# Patient Record
Sex: Female | Born: 1945 | Race: White | Hispanic: No | Smoking: Former smoker
Health system: Southern US, Community
[De-identification: ages and names within clinical notes are randomized; demographics above are authoritative.]

## PROBLEM LIST (undated history)

## (undated) DIAGNOSIS — D369 Benign neoplasm, unspecified site: Secondary | ICD-10-CM

## (undated) DIAGNOSIS — K449 Diaphragmatic hernia without obstruction or gangrene: Secondary | ICD-10-CM

## (undated) DIAGNOSIS — R634 Abnormal weight loss: Secondary | ICD-10-CM

## (undated) DIAGNOSIS — C801 Malignant (primary) neoplasm, unspecified: Secondary | ICD-10-CM

## (undated) DIAGNOSIS — I10 Essential (primary) hypertension: Secondary | ICD-10-CM

## (undated) DIAGNOSIS — K219 Gastro-esophageal reflux disease without esophagitis: Secondary | ICD-10-CM

## (undated) DIAGNOSIS — C50919 Malignant neoplasm of unspecified site of unspecified female breast: Secondary | ICD-10-CM

## (undated) DIAGNOSIS — R630 Anorexia: Secondary | ICD-10-CM

## (undated) DIAGNOSIS — M25471 Effusion, right ankle: Principal | ICD-10-CM

## (undated) DIAGNOSIS — C7951 Secondary malignant neoplasm of bone: Principal | ICD-10-CM

## (undated) DIAGNOSIS — J189 Pneumonia, unspecified organism: Secondary | ICD-10-CM

## (undated) DIAGNOSIS — S8251XA Displaced fracture of medial malleolus of right tibia, initial encounter for closed fracture: Secondary | ICD-10-CM

## (undated) DIAGNOSIS — C799 Secondary malignant neoplasm of unspecified site: Secondary | ICD-10-CM

## (undated) DIAGNOSIS — A0472 Enterocolitis due to Clostridium difficile, not specified as recurrent: Secondary | ICD-10-CM

## (undated) DIAGNOSIS — T50905A Adverse effect of unspecified drugs, medicaments and biological substances, initial encounter: Secondary | ICD-10-CM

## (undated) DIAGNOSIS — IMO0002 Reserved for concepts with insufficient information to code with codable children: Secondary | ICD-10-CM

## (undated) DIAGNOSIS — K222 Esophageal obstruction: Secondary | ICD-10-CM

## (undated) HISTORY — DX: Secondary malignant neoplasm of bone: C79.51

## (undated) HISTORY — DX: Displaced fracture of medial malleolus of right tibia, initial encounter for closed fracture: S82.51XA

## (undated) HISTORY — DX: Diaphragmatic hernia without obstruction or gangrene: K44.9

## (undated) HISTORY — PX: OOPHORECTOMY: SHX86

## (undated) HISTORY — DX: Malignant (primary) neoplasm, unspecified: C80.1

## (undated) HISTORY — PX: CHOLECYSTECTOMY: SHX55

## (undated) HISTORY — PX: LYMPH NODE DISSECTION: SHX5087

## (undated) HISTORY — DX: Esophageal obstruction: K22.2

## (undated) HISTORY — DX: Malignant neoplasm of unspecified site of unspecified female breast: C50.919

## (undated) HISTORY — DX: Essential (primary) hypertension: I10

## (undated) HISTORY — DX: Abnormal weight loss: R63.4

## (undated) HISTORY — DX: Gastro-esophageal reflux disease without esophagitis: K21.9

## (undated) HISTORY — DX: Benign neoplasm, unspecified site: D36.9

## (undated) HISTORY — PX: PARATHYROIDECTOMY: SHX19

## (undated) HISTORY — DX: Anorexia: R63.0

## (undated) HISTORY — PX: CARPAL TUNNEL RELEASE: SHX101

## (undated) HISTORY — DX: Effusion, right ankle: M25.471

## (undated) HISTORY — DX: Adverse effect of unspecified drugs, medicaments and biological substances, initial encounter: T50.905A

## (undated) HISTORY — PX: BREAST SURGERY: SHX581

## (undated) HISTORY — PX: INGUINAL HERNIA REPAIR: SUR1180

---

## 1989-04-29 HISTORY — PX: GALLBLADDER SURGERY: SHX652

## 2001-09-15 ENCOUNTER — Other Ambulatory Visit: Admission: RE | Admit: 2001-09-15 | Discharge: 2001-09-15 | Payer: Self-pay | Admitting: Unknown Physician Specialty

## 2002-05-10 ENCOUNTER — Encounter: Payer: Self-pay | Admitting: Surgery

## 2002-05-10 ENCOUNTER — Ambulatory Visit (HOSPITAL_COMMUNITY): Admission: RE | Admit: 2002-05-10 | Discharge: 2002-05-10 | Payer: Self-pay | Admitting: Surgery

## 2002-07-19 ENCOUNTER — Encounter: Payer: Self-pay | Admitting: Surgery

## 2002-07-22 ENCOUNTER — Encounter (INDEPENDENT_AMBULATORY_CARE_PROVIDER_SITE_OTHER): Payer: Self-pay | Admitting: Specialist

## 2002-07-22 ENCOUNTER — Ambulatory Visit (HOSPITAL_COMMUNITY): Admission: RE | Admit: 2002-07-22 | Discharge: 2002-07-23 | Payer: Self-pay | Admitting: Surgery

## 2003-04-07 ENCOUNTER — Ambulatory Visit (HOSPITAL_BASED_OUTPATIENT_CLINIC_OR_DEPARTMENT_OTHER): Admission: RE | Admit: 2003-04-07 | Discharge: 2003-04-07 | Payer: Self-pay | Admitting: Orthopedic Surgery

## 2003-04-26 ENCOUNTER — Ambulatory Visit (HOSPITAL_BASED_OUTPATIENT_CLINIC_OR_DEPARTMENT_OTHER): Admission: RE | Admit: 2003-04-26 | Discharge: 2003-04-26 | Payer: Self-pay | Admitting: Orthopedic Surgery

## 2004-07-09 ENCOUNTER — Ambulatory Visit: Payer: Self-pay | Admitting: Internal Medicine

## 2005-03-06 ENCOUNTER — Ambulatory Visit: Payer: Self-pay | Admitting: Internal Medicine

## 2005-11-18 ENCOUNTER — Ambulatory Visit: Admission: RE | Admit: 2005-11-18 | Discharge: 2006-01-19 | Payer: Self-pay | Admitting: *Deleted

## 2006-02-11 ENCOUNTER — Ambulatory Visit: Payer: Self-pay | Admitting: Oncology

## 2006-02-21 ENCOUNTER — Ambulatory Visit: Payer: Self-pay | Admitting: Internal Medicine

## 2006-03-24 ENCOUNTER — Ambulatory Visit (HOSPITAL_COMMUNITY): Admission: RE | Admit: 2006-03-24 | Discharge: 2006-03-24 | Payer: Self-pay | Admitting: Internal Medicine

## 2006-03-24 ENCOUNTER — Ambulatory Visit: Payer: Self-pay | Admitting: Internal Medicine

## 2006-05-13 ENCOUNTER — Ambulatory Visit: Payer: Self-pay | Admitting: Oncology

## 2006-06-02 ENCOUNTER — Encounter: Admission: RE | Admit: 2006-06-02 | Discharge: 2006-06-02 | Payer: Self-pay | Admitting: Oncology

## 2006-09-11 ENCOUNTER — Ambulatory Visit: Payer: Self-pay | Admitting: Oncology

## 2007-02-11 ENCOUNTER — Ambulatory Visit: Payer: Self-pay | Admitting: Oncology

## 2007-04-01 ENCOUNTER — Ambulatory Visit: Payer: Self-pay | Admitting: Internal Medicine

## 2007-06-09 ENCOUNTER — Encounter: Admission: RE | Admit: 2007-06-09 | Discharge: 2007-06-09 | Payer: Self-pay | Admitting: Oncology

## 2007-09-07 ENCOUNTER — Ambulatory Visit: Payer: Self-pay | Admitting: Oncology

## 2007-12-02 ENCOUNTER — Ambulatory Visit: Payer: Self-pay | Admitting: Oncology

## 2007-12-04 LAB — COMPREHENSIVE METABOLIC PANEL
AST: 20 U/L (ref 0–37)
Albumin: 4 g/dL (ref 3.5–5.2)
BUN: 17 mg/dL (ref 6–23)
CO2: 25 mEq/L (ref 19–32)
Calcium: 8.6 mg/dL (ref 8.4–10.5)
Chloride: 106 mEq/L (ref 96–112)
Glucose, Bld: 91 mg/dL (ref 70–99)
Potassium: 3.8 mEq/L (ref 3.5–5.3)

## 2007-12-04 LAB — CBC WITH DIFFERENTIAL/PLATELET
Basophils Absolute: 0 10*3/uL (ref 0.0–0.1)
EOS%: 2.7 % (ref 0.0–7.0)
Eosinophils Absolute: 0.2 10*3/uL (ref 0.0–0.5)
HGB: 14.2 g/dL (ref 11.6–15.9)
MONO#: 0.5 10*3/uL (ref 0.1–0.9)
NEUT#: 4.2 10*3/uL (ref 1.5–6.5)
RDW: 13.4 % (ref 11.3–14.5)
WBC: 5.9 10*3/uL (ref 3.9–10.0)
lymph#: 1 10*3/uL (ref 0.9–3.3)

## 2008-04-07 ENCOUNTER — Ambulatory Visit: Payer: Self-pay | Admitting: Internal Medicine

## 2008-05-11 ENCOUNTER — Ambulatory Visit: Payer: Self-pay | Admitting: Oncology

## 2008-09-21 ENCOUNTER — Ambulatory Visit: Payer: Self-pay | Admitting: Oncology

## 2008-09-23 LAB — CBC WITH DIFFERENTIAL/PLATELET
BASO%: 0.4 % (ref 0.0–2.0)
LYMPH%: 15.3 % (ref 14.0–49.7)
MCHC: 34.1 g/dL (ref 31.5–36.0)
MONO#: 0.8 10*3/uL (ref 0.1–0.9)
Platelets: 225 10*3/uL (ref 145–400)
RBC: 4.32 10*6/uL (ref 3.70–5.45)
WBC: 8.4 10*3/uL (ref 3.9–10.3)

## 2008-09-23 LAB — COMPREHENSIVE METABOLIC PANEL
ALT: 27 U/L (ref 0–35)
AST: 33 U/L (ref 0–37)
Alkaline Phosphatase: 50 U/L (ref 39–117)
CO2: 27 mEq/L (ref 19–32)
Sodium: 141 mEq/L (ref 135–145)
Total Bilirubin: 0.6 mg/dL (ref 0.3–1.2)
Total Protein: 5.9 g/dL — ABNORMAL LOW (ref 6.0–8.3)

## 2008-09-23 LAB — LACTATE DEHYDROGENASE: LDH: 159 U/L (ref 94–250)

## 2008-09-27 LAB — ANA: Anti Nuclear Antibody(ANA): NEGATIVE

## 2008-09-27 LAB — SEDIMENTATION RATE: Sed Rate: 2 mm/hr (ref 0–22)

## 2008-10-06 ENCOUNTER — Ambulatory Visit (HOSPITAL_COMMUNITY): Admission: RE | Admit: 2008-10-06 | Discharge: 2008-10-06 | Payer: Self-pay | Admitting: Ophthalmology

## 2008-12-26 DIAGNOSIS — Z853 Personal history of malignant neoplasm of breast: Secondary | ICD-10-CM | POA: Insufficient documentation

## 2008-12-26 DIAGNOSIS — Z8672 Personal history of thrombophlebitis: Secondary | ICD-10-CM | POA: Insufficient documentation

## 2008-12-26 DIAGNOSIS — R110 Nausea: Secondary | ICD-10-CM | POA: Insufficient documentation

## 2008-12-26 DIAGNOSIS — R197 Diarrhea, unspecified: Secondary | ICD-10-CM | POA: Insufficient documentation

## 2008-12-26 DIAGNOSIS — M1990 Unspecified osteoarthritis, unspecified site: Secondary | ICD-10-CM | POA: Insufficient documentation

## 2008-12-26 DIAGNOSIS — K219 Gastro-esophageal reflux disease without esophagitis: Secondary | ICD-10-CM | POA: Insufficient documentation

## 2008-12-26 DIAGNOSIS — Z860100 Personal history of colon polyps, unspecified: Secondary | ICD-10-CM | POA: Insufficient documentation

## 2008-12-27 ENCOUNTER — Ambulatory Visit: Payer: Self-pay | Admitting: Internal Medicine

## 2009-01-04 ENCOUNTER — Encounter: Payer: Self-pay | Admitting: Internal Medicine

## 2009-01-06 ENCOUNTER — Ambulatory Visit (HOSPITAL_COMMUNITY): Admission: RE | Admit: 2009-01-06 | Discharge: 2009-01-06 | Payer: Self-pay | Admitting: Internal Medicine

## 2009-01-10 ENCOUNTER — Encounter (HOSPITAL_COMMUNITY): Admission: RE | Admit: 2009-01-10 | Discharge: 2009-01-25 | Payer: Self-pay | Admitting: Internal Medicine

## 2009-01-10 ENCOUNTER — Encounter: Payer: Self-pay | Admitting: Internal Medicine

## 2009-01-10 DIAGNOSIS — R9389 Abnormal findings on diagnostic imaging of other specified body structures: Secondary | ICD-10-CM

## 2009-01-11 ENCOUNTER — Encounter: Payer: Self-pay | Admitting: Internal Medicine

## 2009-01-13 ENCOUNTER — Ambulatory Visit (HOSPITAL_COMMUNITY): Admission: RE | Admit: 2009-01-13 | Discharge: 2009-01-13 | Payer: Self-pay | Admitting: Internal Medicine

## 2009-01-13 ENCOUNTER — Ambulatory Visit: Payer: Self-pay | Admitting: Internal Medicine

## 2009-01-13 LAB — CONVERTED CEMR LAB
Beta Globulin: 7.4 % — ABNORMAL HIGH (ref 4.7–7.2)
Gamma Globulin: 11.9 % (ref 11.1–18.8)
Total Protein, Serum Electrophoresis: 6.8 g/dL (ref 6.0–8.3)

## 2009-01-16 ENCOUNTER — Encounter: Payer: Self-pay | Admitting: Internal Medicine

## 2009-01-17 ENCOUNTER — Ambulatory Visit (HOSPITAL_COMMUNITY): Admission: RE | Admit: 2009-01-17 | Discharge: 2009-01-17 | Payer: Self-pay | Admitting: Oncology

## 2009-01-18 ENCOUNTER — Ambulatory Visit: Payer: Self-pay | Admitting: Oncology

## 2009-01-18 LAB — COMPREHENSIVE METABOLIC PANEL
ALT: 19 U/L (ref 0–35)
AST: 23 U/L (ref 0–37)
BUN: 13 mg/dL (ref 6–23)
Calcium: 8.5 mg/dL (ref 8.4–10.5)
Chloride: 108 mEq/L (ref 96–112)
Creatinine, Ser: 0.77 mg/dL (ref 0.40–1.20)
Total Bilirubin: 0.5 mg/dL (ref 0.3–1.2)

## 2009-01-18 LAB — CBC WITH DIFFERENTIAL/PLATELET
BASO%: 0.3 % (ref 0.0–2.0)
Basophils Absolute: 0 10*3/uL (ref 0.0–0.1)
EOS%: 4 % (ref 0.0–7.0)
HCT: 38.5 % (ref 34.8–46.6)
HGB: 12.9 g/dL (ref 11.6–15.9)
LYMPH%: 15.7 % (ref 14.0–49.7)
MCH: 29.7 pg (ref 25.1–34.0)
MCHC: 33.5 g/dL (ref 31.5–36.0)
MCV: 88.5 fL (ref 79.5–101.0)
NEUT%: 69.8 % (ref 38.4–76.8)
Platelets: 230 10*3/uL (ref 145–400)
lymph#: 1 10*3/uL (ref 0.9–3.3)

## 2009-01-18 LAB — CANCER ANTIGEN 27.29: CA 27.29: 156 U/mL — ABNORMAL HIGH (ref 0–39)

## 2009-01-30 ENCOUNTER — Encounter: Payer: Self-pay | Admitting: Internal Medicine

## 2009-01-31 ENCOUNTER — Encounter: Payer: Self-pay | Admitting: Oncology

## 2009-01-31 ENCOUNTER — Other Ambulatory Visit: Admission: RE | Admit: 2009-01-31 | Discharge: 2009-01-31 | Payer: Self-pay | Admitting: Oncology

## 2009-01-31 LAB — CBC WITH DIFFERENTIAL/PLATELET
Basophils Absolute: 0.1 10*3/uL (ref 0.0–0.1)
Eosinophils Absolute: 0.2 10*3/uL (ref 0.0–0.5)
HGB: 15.7 g/dL (ref 11.6–15.9)
MCV: 91.7 fL (ref 79.5–101.0)
MONO#: 1.5 10*3/uL — ABNORMAL HIGH (ref 0.1–0.9)
NEUT#: 13.6 10*3/uL — ABNORMAL HIGH (ref 1.5–6.5)
RBC: 5.14 10*6/uL (ref 3.70–5.45)
RDW: 13.6 % (ref 11.2–14.5)
WBC: 17.2 10*3/uL — ABNORMAL HIGH (ref 3.9–10.3)
lymph#: 1.8 10*3/uL (ref 0.9–3.3)

## 2009-01-31 LAB — MORPHOLOGY
PLT EST: ADEQUATE
RBC Comments: NORMAL

## 2009-02-02 LAB — BETA 2 MICROGLOBULIN, SERUM: Beta-2 Microglobulin: 1.59 mg/L (ref 1.01–1.73)

## 2009-02-02 LAB — IMMUNOFIXATION ELECTROPHORESIS
IgA: 259 mg/dL (ref 68–378)
IgG (Immunoglobin G), Serum: 868 mg/dL (ref 694–1618)
IgM, Serum: 122 mg/dL (ref 60–263)
Total Protein, Serum Electrophoresis: 6.8 g/dL (ref 6.0–8.3)

## 2009-02-14 ENCOUNTER — Ambulatory Visit (HOSPITAL_COMMUNITY): Admission: RE | Admit: 2009-02-14 | Discharge: 2009-02-14 | Payer: Self-pay | Admitting: Oncology

## 2009-02-16 ENCOUNTER — Ambulatory Visit (HOSPITAL_COMMUNITY): Admission: RE | Admit: 2009-02-16 | Discharge: 2009-02-16 | Payer: Self-pay | Admitting: Oncology

## 2009-02-22 ENCOUNTER — Encounter: Payer: Self-pay | Admitting: Internal Medicine

## 2009-02-27 ENCOUNTER — Encounter: Payer: Self-pay | Admitting: Internal Medicine

## 2009-03-02 ENCOUNTER — Ambulatory Visit: Payer: Self-pay | Admitting: Oncology

## 2009-03-06 ENCOUNTER — Encounter: Payer: Self-pay | Admitting: Internal Medicine

## 2009-03-06 LAB — LACTATE DEHYDROGENASE: LDH: 233 U/L (ref 94–250)

## 2009-03-06 LAB — CBC WITH DIFFERENTIAL/PLATELET
BASO%: 0.3 % (ref 0.0–2.0)
Basophils Absolute: 0 10*3/uL (ref 0.0–0.1)
EOS%: 3.9 % (ref 0.0–7.0)
MCH: 30.5 pg (ref 25.1–34.0)
MCHC: 34 g/dL (ref 31.5–36.0)
MCV: 89.8 fL (ref 79.5–101.0)
MONO%: 12 % (ref 0.0–14.0)
RBC: 4.69 10*6/uL (ref 3.70–5.45)
RDW: 13.8 % (ref 11.2–14.5)
lymph#: 1.1 10*3/uL (ref 0.9–3.3)

## 2009-03-06 LAB — COMPREHENSIVE METABOLIC PANEL
AST: 22 U/L (ref 0–37)
Albumin: 4 g/dL (ref 3.5–5.2)
BUN: 13 mg/dL (ref 6–23)
Calcium: 9.1 mg/dL (ref 8.4–10.5)
Chloride: 108 mEq/L (ref 96–112)
Creatinine, Ser: 0.6 mg/dL (ref 0.40–1.20)
Glucose, Bld: 89 mg/dL (ref 70–99)

## 2009-03-06 LAB — PROTIME-INR
INR: 2.7 (ref 2.00–3.50)
Protime: 32.4 Seconds — ABNORMAL HIGH (ref 10.6–13.4)

## 2009-03-08 ENCOUNTER — Encounter: Payer: Self-pay | Admitting: Internal Medicine

## 2009-03-17 ENCOUNTER — Encounter (INDEPENDENT_AMBULATORY_CARE_PROVIDER_SITE_OTHER): Payer: Self-pay | Admitting: *Deleted

## 2009-03-17 ENCOUNTER — Ambulatory Visit (HOSPITAL_COMMUNITY): Admission: RE | Admit: 2009-03-17 | Discharge: 2009-03-17 | Payer: Self-pay | Admitting: Oncology

## 2009-04-05 ENCOUNTER — Encounter: Payer: Self-pay | Admitting: Internal Medicine

## 2009-04-26 ENCOUNTER — Ambulatory Visit: Payer: Self-pay | Admitting: Oncology

## 2009-04-29 DIAGNOSIS — K449 Diaphragmatic hernia without obstruction or gangrene: Secondary | ICD-10-CM

## 2009-04-29 DIAGNOSIS — D369 Benign neoplasm, unspecified site: Secondary | ICD-10-CM

## 2009-04-29 HISTORY — DX: Diaphragmatic hernia without obstruction or gangrene: K44.9

## 2009-04-29 HISTORY — DX: Benign neoplasm, unspecified site: D36.9

## 2009-05-01 LAB — COMPREHENSIVE METABOLIC PANEL
ALT: 13 U/L (ref 0–35)
AST: 19 U/L (ref 0–37)
Albumin: 4 g/dL (ref 3.5–5.2)
Alkaline Phosphatase: 61 U/L (ref 39–117)
BUN: 14 mg/dL (ref 6–23)
Potassium: 3.5 mEq/L (ref 3.5–5.3)
Sodium: 141 mEq/L (ref 135–145)
Total Protein: 6.1 g/dL (ref 6.0–8.3)

## 2009-05-01 LAB — CBC WITH DIFFERENTIAL/PLATELET
Eosinophils Absolute: 0.1 10*3/uL (ref 0.0–0.5)
HGB: 13.8 g/dL (ref 11.6–15.9)
MONO#: 0.8 10*3/uL (ref 0.1–0.9)
NEUT#: 4.7 10*3/uL (ref 1.5–6.5)
RBC: 4.51 10*6/uL (ref 3.70–5.45)
RDW: 15.1 % — ABNORMAL HIGH (ref 11.2–14.5)
WBC: 6.8 10*3/uL (ref 3.9–10.3)
lymph#: 1.1 10*3/uL (ref 0.9–3.3)

## 2009-05-01 LAB — PROTIME-INR
INR: 3.5 (ref 2.00–3.50)
Protime: 42 Seconds — ABNORMAL HIGH (ref 10.6–13.4)

## 2009-05-01 LAB — LACTATE DEHYDROGENASE: LDH: 197 U/L (ref 94–250)

## 2009-05-01 LAB — CANCER ANTIGEN 27.29: CA 27.29: 82 U/mL — ABNORMAL HIGH (ref 0–39)

## 2009-05-08 ENCOUNTER — Ambulatory Visit (HOSPITAL_COMMUNITY): Admission: RE | Admit: 2009-05-08 | Discharge: 2009-05-08 | Payer: Self-pay | Admitting: Oncology

## 2009-05-26 ENCOUNTER — Encounter: Payer: Self-pay | Admitting: Internal Medicine

## 2009-07-27 ENCOUNTER — Ambulatory Visit: Payer: Self-pay | Admitting: Oncology

## 2009-07-31 LAB — COMPREHENSIVE METABOLIC PANEL
Albumin: 4.2 g/dL (ref 3.5–5.2)
Alkaline Phosphatase: 61 U/L (ref 39–117)
BUN: 18 mg/dL (ref 6–23)
Creatinine, Ser: 0.66 mg/dL (ref 0.40–1.20)
Glucose, Bld: 114 mg/dL — ABNORMAL HIGH (ref 70–99)
Potassium: 3.5 mEq/L (ref 3.5–5.3)
Total Bilirubin: 0.4 mg/dL (ref 0.3–1.2)

## 2009-07-31 LAB — PROTIME-INR: Protime: 26.4 Seconds — ABNORMAL HIGH (ref 10.6–13.4)

## 2009-07-31 LAB — CBC WITH DIFFERENTIAL/PLATELET
BASO%: 0.4 % (ref 0.0–2.0)
EOS%: 1.4 % (ref 0.0–7.0)
LYMPH%: 14.5 % (ref 14.0–49.7)
MCH: 31.4 pg (ref 25.1–34.0)
MCHC: 34 g/dL (ref 31.5–36.0)
MCV: 92.3 fL (ref 79.5–101.0)
MONO%: 8.1 % (ref 0.0–14.0)
Platelets: 263 10*3/uL (ref 145–400)
RBC: 4.57 10*6/uL (ref 3.70–5.45)

## 2009-07-31 LAB — CANCER ANTIGEN 27.29: CA 27.29: 39 U/mL (ref 0–39)

## 2009-08-02 ENCOUNTER — Encounter (HOSPITAL_COMMUNITY): Admission: RE | Admit: 2009-08-02 | Discharge: 2009-09-01 | Payer: Self-pay | Admitting: Oncology

## 2009-08-14 LAB — FECAL OCCULT BLOOD, GUAIAC: Occult Blood: NEGATIVE

## 2009-08-23 ENCOUNTER — Encounter: Payer: Self-pay | Admitting: Internal Medicine

## 2009-08-30 ENCOUNTER — Encounter: Payer: Self-pay | Admitting: Internal Medicine

## 2009-11-09 ENCOUNTER — Ambulatory Visit: Payer: Self-pay | Admitting: Oncology

## 2009-11-13 LAB — CBC WITH DIFFERENTIAL/PLATELET
BASO%: 0.6 % (ref 0.0–2.0)
Eosinophils Absolute: 0.2 10*3/uL (ref 0.0–0.5)
MCHC: 33 g/dL (ref 31.5–36.0)
MONO#: 0.7 10*3/uL (ref 0.1–0.9)
MONO%: 8.2 % (ref 0.0–14.0)
NEUT#: 6.4 10*3/uL (ref 1.5–6.5)
RBC: 4.7 10*6/uL (ref 3.70–5.45)
RDW: 13.5 % (ref 11.2–14.5)
WBC: 8.7 10*3/uL (ref 3.9–10.3)

## 2009-11-13 LAB — PROTIME-INR
INR: 2.1 (ref 2.00–3.50)
Protime: 25.2 Seconds — ABNORMAL HIGH (ref 10.6–13.4)

## 2009-11-14 LAB — COMPREHENSIVE METABOLIC PANEL
AST: 20 U/L (ref 0–37)
Albumin: 4.1 g/dL (ref 3.5–5.2)
Alkaline Phosphatase: 69 U/L (ref 39–117)
BUN: 16 mg/dL (ref 6–23)
Potassium: 3.5 mEq/L (ref 3.5–5.3)
Sodium: 142 mEq/L (ref 135–145)
Total Bilirubin: 0.5 mg/dL (ref 0.3–1.2)

## 2009-11-14 LAB — LACTATE DEHYDROGENASE: LDH: 181 U/L (ref 94–250)

## 2009-11-29 ENCOUNTER — Encounter: Payer: Self-pay | Admitting: Internal Medicine

## 2009-12-08 ENCOUNTER — Ambulatory Visit: Payer: Self-pay | Admitting: Internal Medicine

## 2009-12-08 DIAGNOSIS — R142 Eructation: Secondary | ICD-10-CM

## 2009-12-08 DIAGNOSIS — R143 Flatulence: Secondary | ICD-10-CM

## 2009-12-08 DIAGNOSIS — R141 Gas pain: Secondary | ICD-10-CM

## 2009-12-08 DIAGNOSIS — K625 Hemorrhage of anus and rectum: Secondary | ICD-10-CM

## 2010-01-02 ENCOUNTER — Encounter: Payer: Self-pay | Admitting: Internal Medicine

## 2010-01-08 ENCOUNTER — Ambulatory Visit (HOSPITAL_COMMUNITY): Admission: RE | Admit: 2010-01-08 | Discharge: 2010-01-08 | Payer: Self-pay | Admitting: Internal Medicine

## 2010-01-08 ENCOUNTER — Ambulatory Visit: Payer: Self-pay | Admitting: Internal Medicine

## 2010-01-08 HISTORY — PX: COLONOSCOPY: SHX174

## 2010-01-08 HISTORY — PX: ESOPHAGOGASTRODUODENOSCOPY: SHX1529

## 2010-01-10 ENCOUNTER — Encounter: Payer: Self-pay | Admitting: Internal Medicine

## 2010-02-23 ENCOUNTER — Ambulatory Visit: Payer: Self-pay | Admitting: Oncology

## 2010-02-27 ENCOUNTER — Ambulatory Visit (HOSPITAL_COMMUNITY): Admission: RE | Admit: 2010-02-27 | Discharge: 2010-02-27 | Payer: Self-pay | Admitting: Oncology

## 2010-02-27 LAB — COMPREHENSIVE METABOLIC PANEL
ALT: 18 U/L (ref 0–35)
AST: 24 U/L (ref 0–37)
Albumin: 4.1 g/dL (ref 3.5–5.2)
BUN: 18 mg/dL (ref 6–23)
Calcium: 9 mg/dL (ref 8.4–10.5)
Chloride: 106 mEq/L (ref 96–112)
Potassium: 3.8 mEq/L (ref 3.5–5.3)
Total Protein: 6.6 g/dL (ref 6.0–8.3)

## 2010-02-27 LAB — CBC WITH DIFFERENTIAL/PLATELET
BASO%: 0.7 % (ref 0.0–2.0)
Basophils Absolute: 0.1 10*3/uL (ref 0.0–0.1)
EOS%: 2.9 % (ref 0.0–7.0)
HGB: 14.5 g/dL (ref 11.6–15.9)
MCH: 30.4 pg (ref 25.1–34.0)
RDW: 13.6 % (ref 11.2–14.5)
lymph#: 1 10*3/uL (ref 0.9–3.3)

## 2010-03-09 ENCOUNTER — Encounter: Admission: RE | Admit: 2010-03-09 | Discharge: 2010-03-09 | Payer: Self-pay | Admitting: Oncology

## 2010-03-14 ENCOUNTER — Encounter: Admission: RE | Admit: 2010-03-14 | Discharge: 2010-03-14 | Payer: Self-pay | Admitting: Oncology

## 2010-04-03 ENCOUNTER — Ambulatory Visit: Payer: Self-pay | Admitting: Oncology

## 2010-04-10 ENCOUNTER — Encounter: Payer: Self-pay | Admitting: Internal Medicine

## 2010-04-24 ENCOUNTER — Encounter: Admission: RE | Admit: 2010-04-24 | Payer: Self-pay | Source: Home / Self Care | Admitting: Surgery

## 2010-04-24 ENCOUNTER — Ambulatory Visit: Admission: RE | Admit: 2010-04-24 | Payer: Self-pay | Source: Home / Self Care | Admitting: Surgery

## 2010-05-04 ENCOUNTER — Ambulatory Visit: Payer: Self-pay | Admitting: Oncology

## 2010-05-07 ENCOUNTER — Encounter: Payer: Self-pay | Admitting: Internal Medicine

## 2010-05-08 LAB — CBC WITH DIFFERENTIAL/PLATELET
BASO%: 0.6 % (ref 0.0–2.0)
Basophils Absolute: 0 10*3/uL (ref 0.0–0.1)
EOS%: 2.3 % (ref 0.0–7.0)
Eosinophils Absolute: 0.2 10*3/uL (ref 0.0–0.5)
HCT: 41.2 % (ref 34.8–46.6)
HGB: 14 g/dL (ref 11.6–15.9)
LYMPH%: 12.8 % — ABNORMAL LOW (ref 14.0–49.7)
MCH: 30.8 pg (ref 25.1–34.0)
MCHC: 33.9 g/dL (ref 31.5–36.0)
MCV: 90.7 fL (ref 79.5–101.0)
MONO#: 0.6 10*3/uL (ref 0.1–0.9)
MONO%: 9.5 % (ref 0.0–14.0)
NEUT#: 5.1 10*3/uL (ref 1.5–6.5)
NEUT%: 74.8 % (ref 38.4–76.8)
Platelets: 266 10*3/uL (ref 145–400)
RBC: 4.54 10*6/uL (ref 3.70–5.45)
RDW: 13 % (ref 11.2–14.5)
WBC: 6.8 10*3/uL (ref 3.9–10.3)
lymph#: 0.9 10*3/uL (ref 0.9–3.3)

## 2010-05-08 LAB — COMPREHENSIVE METABOLIC PANEL
ALT: 20 U/L (ref 0–35)
AST: 25 U/L (ref 0–37)
Albumin: 3.8 g/dL (ref 3.5–5.2)
Alkaline Phosphatase: 72 U/L (ref 39–117)
BUN: 17 mg/dL (ref 6–23)
CO2: 29 mEq/L (ref 19–32)
Calcium: 9.2 mg/dL (ref 8.4–10.5)
Chloride: 104 mEq/L (ref 96–112)
Creatinine, Ser: 0.65 mg/dL (ref 0.40–1.20)
Glucose, Bld: 100 mg/dL — ABNORMAL HIGH (ref 70–99)
Potassium: 3.5 mEq/L (ref 3.5–5.3)
Sodium: 140 mEq/L (ref 135–145)
Total Bilirubin: 0.6 mg/dL (ref 0.3–1.2)
Total Protein: 6.6 g/dL (ref 6.0–8.3)

## 2010-05-08 LAB — CANCER ANTIGEN 27.29: CA 27.29: 27 U/mL (ref 0–39)

## 2010-05-08 LAB — LACTATE DEHYDROGENASE: LDH: 179 U/L (ref 94–250)

## 2010-05-10 ENCOUNTER — Encounter
Admission: RE | Admit: 2010-05-10 | Discharge: 2010-05-10 | Payer: Self-pay | Source: Home / Self Care | Attending: Oncology | Admitting: Oncology

## 2010-05-14 ENCOUNTER — Ambulatory Visit
Admission: RE | Admit: 2010-05-14 | Discharge: 2010-05-15 | Payer: Self-pay | Source: Home / Self Care | Attending: Surgery | Admitting: Surgery

## 2010-05-16 LAB — APTT: aPTT: 26 seconds (ref 24–37)

## 2010-05-16 LAB — PROTIME-INR
INR: 1.16 (ref 0.00–1.49)
Prothrombin Time: 15 seconds (ref 11.6–15.2)

## 2010-05-16 LAB — POCT I-STAT, CHEM 8
BUN: 15 mg/dL (ref 6–23)
Calcium, Ion: 1.16 mmol/L (ref 1.12–1.32)
Chloride: 104 mEq/L (ref 96–112)
Creatinine, Ser: 0.6 mg/dL (ref 0.4–1.2)
Glucose, Bld: 95 mg/dL (ref 70–99)
HCT: 45 % (ref 36.0–46.0)
Hemoglobin: 15.3 g/dL — ABNORMAL HIGH (ref 12.0–15.0)
Potassium: 3.6 mEq/L (ref 3.5–5.1)
Sodium: 142 mEq/L (ref 135–145)
TCO2: 30 mmol/L (ref 0–100)

## 2010-05-17 NOTE — Op Note (Signed)
[name], [name]              ACCOUNT NO.:  [id number]  MEDICAL RECORD NO.:  [id number]          PATIENT TYPE:  AMB  LOCATION:  DSC                          FACILITY:  [facility]  PHYSICIAN:  Currie Paris, M.D.DATE OF BIRTH:  1945/08/22  DATE OF PROCEDURE:  05/14/2010 DATE OF DISCHARGE:                              OPERATIVE REPORT   PREOPERATIVE DIAGNOSIS:  Left axillary metastatic breast cancer, primary unknown.  POSTOPERATIVE DIAGNOSIS:  Left axillary metastatic breast cancer, primary unknown.  PROCEDURE:  Left axillary nodal dissection.  SURGEON:  Currie Paris, MD.  ANESTHESIA:  General.  CLINICAL HISTORY:  This is a 65 year old lady previously treated with a right lumpectomy node dissection and radiation therapy for right-sided breast cancer who has known metastatic disease to bone which has been relatively quiescent.  However, on recent evaluation, she was found to have breast cancer and several left axillary lymph nodes.  No primary was identified on the left breast on MRI.  A small abnormality was seen on MRI of the right breast of uncertain etiology.  The left axillary nodes were PET avid, but no other areas were abnormal, and Dr. Cyndie Chime felt that we needed simply to follow up the right breast abnormality with a biopsy subsequently and then long-term followup without any surgical intervention at the present time since this was very small and not definitely carcinoma.  She wishes to proceed with the left axillary dissection for the known metastatic disease.  I discussed that with the patient.  DESCRIPTION OF PROCEDURE:  I saw the patient in the holding area and she had no further questions after we reviewed the plans as noted above.  I marked the left side as the operative side.  The patient was taken to the operating room; and after satisfactory general anesthesia had been obtained, the left breast and axillary area were prepped and draped and  the time-out was done.  I made a transverse axillary incision and deepened it down to the chest wall.  I then identified the clavipectoral fascia and opened that and I started working superiorly.  The lower axillary area was felt free of disease, but as I got into the higher areas, I could feel several large nodes, one of which was about 2.5 cm.  I identified the axillary vein and swept the axillary contents from medial to lateral, superior to inferior using clips on the vessels.  The second intercostal nerve was taken using a clip.  I preserved the long thoracic and thoracodorsal nerves and checked for function at the end and they both seemed to function appropriately with gentle stimulation.  The final attachment to the latissimus and inferiorly were divided and the specimen handed off. I spent several minutes irrigating making sure everything was dry.  I carefully palpated to make sure there was no residual obvious disease and everything appeared to be normal with no abnormal tissue being left behind either visually or by palpation.  I irrigated again and then placed a 19 Blake drain.  A final irrigation was made and everything was dry.  The incision was closed with 3-0 Vicryl, 4-0 Monocryl subcuticular, and  Dermabond.  The patient tolerated the procedure well and there were no complications.  All counts were correct.     Currie Paris, M.D.     CJS/MEDQ  D:  05/14/2010  T:  05/14/2010  Job:  [id number]  cc:   Genene Churn. Cyndie Chime, M.D.  Electronically Signed by Cyndia Bent M.D. on 05/17/2010 12:58:02 PM

## 2010-05-19 ENCOUNTER — Encounter: Payer: Self-pay | Admitting: Oncology

## 2010-05-22 ENCOUNTER — Encounter: Payer: Self-pay | Admitting: Internal Medicine

## 2010-05-29 NOTE — Letter (Signed)
Summary: External Other  External Other   Imported By: Peggyann Shoals 08/30/2009 10:47:03  _____________________________________________________________________  External Attachment:    Type:   Image     Comment:   External Document

## 2010-05-29 NOTE — Letter (Signed)
[facility]   Imported By: Rosine Beat 12/08/2009 15:20:10  _____________________________________________________________________  External Attachment:    Type:   Image     Comment:   External Document

## 2010-05-29 NOTE — Assessment & Plan Note (Signed)
Summary: rectal bleeding & growth? cdg   Visit Type:  Initial Visit Primary Care Provider:  [facility]  Chief Complaint:  rectal bleeding and ? hemorroid.  History of Present Illness: History of metastatic breast cancer.  Personal history of adenomatous polyps; a positive family history of colon cancer in her mother.  Patient's been receiving chemotherapy for metastatic breast cancer to bone under the direction of Dr. Cyndie Chime. She's had a remarkable response with normalization of her tumor markers. From a GI standpoint, she's done well - she has 1-2 bowel movements daily has any diarrhea. Occasionally has some fecal seepage after having a was normal bowel movement. She notices a small nodule at her anus occasionally.  Notes small amount of blood per rectum when wiping. Her last colonoscopy was performed in 2007. This revealed a normal rectum and pancolonic diverticula; otherwise normal colon. She is due for a repeat colonoscopy in the fall of 2012. No significant reflux symptoms. She occasionally has some epigastric pain which is transient during the initiation of her monthly chemotherapy.  She does complain of worsening of abdominal bloating.  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 Q 6 Hours As Needed 2)  Protonix 40 Mg Tbec (Pantoprazole Sodium) .... As Needed 3)  Coumadin 5 Mg Tabs  (Warfarin Sodium) .... Uad 4)  Clotrimazole-Betamethasone 1-0.05 % Crea (Clotrimazole-Betamethasone) .... As Needed 5)  Faslodex 500mg  .... Q 28 Days  Allergies (verified): 1)  ! Codeine 2)  ! Novocain 3)  ! Percodan 4)  ! Aciphex  Past History:  Past Surgical History: Last updated: 12/26/2008 Cholecystectomy Left foot surgery ***Radiation and chemotherapy for breast parathyroidectomy w/ removal of benign thyroid nodule (March 2004) R oophorectomy for ovarian cyst (Nov 2003) Cataract surgery Carpal Tunnel Release  Family History: Last updated: 18-Jan-2009 Father: Deceased age 4  cancer lung,leg,mouth Mother: Deceased age 10 colon cancer Siblings: six   Social History: Last updated: 01-18-09 Marital Status: widowed Children: no Occupation: Arboriculturist  Past Medical History: Blood clots breast cancer that moved to the bones  Vital Signs:  Patient profile:   65 year old female Height:      62 inches Weight:      179 pounds BMI:     32.86 Temp:     98.0 degrees F oral Pulse rate:   60 / minute BP sitting:   138 / 92  (left arm) Cuff size:   regular  Vitals Entered By: Hendricks Limes LPN (December 08, 2009 10:32 AM)  Physical Exam  General:  she appears remarkably well all planes considered. In fact, she appears to baseline. Abdomen:  mildly obese positive bowel sounds soft nontender without appreciable mass or organomegaly Rectal:  no external lesions good sphincter tone no mass appreciable scant brown stool in rectal vault was Hemoccult negative  Impression & Recommendations: Impression: Delightful 65 year old lady with metastatic breast cancer to bone. History of colonic adenomas and a family  history colon cancer in a first degree relative. She has noted a little knot in her anus and some  blood per rectum recently. Today's examination is somewhat reassuring.  She does have a little fecal seepage on occasion and does have some increased  flatulence . 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  Recommendations: I would have a low threshold for moving up the timing of for colonoscopy to this fall instead of waiting a year. I discussed this approach with this  nice lady.  I would like to discuss with her oncologist. Maryclare Labrador go ahead and give her some samples of digestive advantage for lactose defense formula to see if will help with both her flatulence and her fecal seepage; might consider a fiber supplement at later date, howeve,r this could worsen flatulence. It could also produce some diarrhea which complicate the picture.  Further recommendations to follow.  Appended Document: rectal bleeding & growth? cdg I left a message w secretary at Dr. Sofie Hartigan office ; he's on vacation until 8/29; he will is to call me back regarding timing of TCS; please let pt know we will not know until then.  Appended Document: rectal bleeding & growth? cdg LMOM to call.  Appended Document: rectal bleeding & growth? cdg Pt informed of the above.  Appended Document: Orders Update    Clinical Lists Changes  Problems: Added new problem of RECTAL BLEEDING (ICD-569.3) Added new problem of FLATULENCE (ICD-787.3) Orders: Added new Service order of Est. Patient Level IV (16109) - Signed Added new Service order of Hemoccult Guaiac-1 spec.(in office) (82270) - Signed      Appended Document: rectal bleeding & growth? cdg Finally spoke w Dr. Cyndie Chime; He agrees w proceeding w TCS; He recommneds holding coumadin X 4 days ; no bridge; please set it up  Appended Document: rectal bleeding & growth? cdg I called pt to schedule tcs, no answer,lmom.

## 2010-05-29 NOTE — Letter (Signed)
Summary: Patient Notice, Colon Biopsy Results  [facility]  [address]   [place], [place] [address]   Phone: [phone]  Fax: [phone]       January 10, 2010   [facility] [address] [place], [place]  [address] 04-22-1946    Dear Ms. Figueira,  I am pleased to inform you that the biopsies taken during your recent colonoscopy did not show any evidence of cancer upon pathologic examination.  There was mild inflammation in your stomach and the polyp biopsy from your small intestine revealed benign findings.  Additional information/recommendations:  You should have a repeat colonoscopy examination  in 5 years.  You should have a repeat EGD in 3 months to check for any residual poyp growth in your small intestine.  Please call us if you are having persistent problems or have questions about your condition that have not been fully answered at this time.  Sincerely,    R. Roetta Sessions MD, FACP [facility] Ph: [phone]    Fax: [phone]   Appended Document: Patient Notice, Colon Biopsy Results mailed letter to pt  Appended Document: Patient Notice, Colon Biopsy Results reminder in computer

## 2010-05-29 NOTE — Letter (Signed)
Summary: LABS  LABS   Imported By: Rexene Alberts 11/29/2009 14:37:36  _____________________________________________________________________  External Attachment:    Type:   Image     Comment:   External Document

## 2010-05-29 NOTE — Letter (Signed)
Summary: External Other  External Other   Imported By: Peggyann Shoals 08/23/2009 11:41:27  _____________________________________________________________________  External Attachment:    Type:   Image     Comment:   External Document

## 2010-05-29 NOTE — Letter (Signed)
Summary: [facility]   Imported By: Diana Eves 05/26/2009 12:02:13  _____________________________________________________________________  External Attachment:    Type:   Image     Comment:   External Document

## 2010-05-29 NOTE — Letter (Signed)
Summary: [facility] POSS EGD ORDER  [facility] POSS EGD ORDER   Imported By: Ave Filter 01/02/2010 10:01:02  _____________________________________________________________________  External Attachment:    Type:   Image     Comment:   External Document

## 2010-05-30 ENCOUNTER — Encounter: Payer: Self-pay | Admitting: Oncology

## 2010-05-31 NOTE — Letter (Signed)
Summary: OFFICE NOTE [facility]  OFFICE NOTE [facility]   Imported By: Rexene Alberts 05/22/2010 08:46:45  _____________________________________________________________________  External Attachment:    Type:   Image     Comment:   External Document

## 2010-05-31 NOTE — Letter (Signed)
Summary: OFFICE PROGRESS NOTE  OFFICE PROGRESS NOTE   Imported By: Rexene Alberts 04/10/2010 11:32:18  _____________________________________________________________________  External Attachment:    Type:   Image     Comment:   External Document

## 2010-05-31 NOTE — Letter (Signed)
Summary: OFFICE CLINIC NOTE  OFFICE CLINIC NOTE   Imported By: Rexene Alberts 05/07/2010 10:55:48  _____________________________________________________________________  External Attachment:    Type:   Image     Comment:   External Document

## 2010-05-31 NOTE — Letter (Signed)
Summary: CHART NOTE  CHART NOTE   Imported By: Rexene Alberts 04/10/2010 11:33:45  _____________________________________________________________________  External Attachment:    Type:   Image     Comment:   External Document

## 2010-06-01 ENCOUNTER — Other Ambulatory Visit: Payer: Self-pay | Admitting: Radiology

## 2010-06-05 ENCOUNTER — Encounter (HOSPITAL_BASED_OUTPATIENT_CLINIC_OR_DEPARTMENT_OTHER): Payer: Medicare Other | Admitting: Oncology

## 2010-06-05 DIAGNOSIS — C50419 Malignant neoplasm of upper-outer quadrant of unspecified female breast: Secondary | ICD-10-CM

## 2010-06-05 DIAGNOSIS — C7951 Secondary malignant neoplasm of bone: Secondary | ICD-10-CM

## 2010-06-05 DIAGNOSIS — Z5111 Encounter for antineoplastic chemotherapy: Secondary | ICD-10-CM

## 2010-06-06 ENCOUNTER — Encounter: Payer: Self-pay | Admitting: Internal Medicine

## 2010-06-14 NOTE — Letter (Signed)
Summary: OFFICE PROGRESS NOTE  OFFICE PROGRESS NOTE   Imported By: Rexene Alberts 06/06/2010 11:25:27  _____________________________________________________________________  External Attachment:    Type:   Image     Comment:   External Document

## 2010-06-27 ENCOUNTER — Encounter: Payer: Self-pay | Admitting: Internal Medicine

## 2010-06-27 ENCOUNTER — Encounter (HOSPITAL_COMMUNITY)
Admission: RE | Admit: 2010-06-27 | Discharge: 2010-06-27 | Disposition: A | Discharge status: Home / Self Care | Payer: Medicare Other | Source: Ambulatory Visit | Attending: Surgery | Admitting: Surgery

## 2010-06-27 ENCOUNTER — Other Ambulatory Visit (HOSPITAL_COMMUNITY): Payer: Self-pay | Admitting: Surgery

## 2010-06-27 DIAGNOSIS — Z01818 Encounter for other preprocedural examination: Secondary | ICD-10-CM | POA: Insufficient documentation

## 2010-06-27 DIAGNOSIS — Z01810 Encounter for preprocedural cardiovascular examination: Secondary | ICD-10-CM | POA: Insufficient documentation

## 2010-06-27 DIAGNOSIS — I519 Heart disease, unspecified: Secondary | ICD-10-CM

## 2010-06-27 DIAGNOSIS — Z01812 Encounter for preprocedural laboratory examination: Secondary | ICD-10-CM | POA: Insufficient documentation

## 2010-06-27 LAB — CBC
MCH: 30.1 pg (ref 26.0–34.0)
MCHC: 33.5 g/dL (ref 30.0–36.0)
Platelets: 272 10*3/uL (ref 150–400)

## 2010-06-27 LAB — DIFFERENTIAL
Basophils Relative: 0 % (ref 0–1)
Eosinophils Absolute: 0.2 10*3/uL (ref 0.0–0.7)
Eosinophils Relative: 2 % (ref 0–5)
Monocytes Absolute: 1 10*3/uL (ref 0.1–1.0)
Monocytes Relative: 11 % (ref 3–12)

## 2010-06-27 LAB — COMPREHENSIVE METABOLIC PANEL
ALT: 19 U/L (ref 0–35)
AST: 23 U/L (ref 0–37)
Albumin: 3.6 g/dL (ref 3.5–5.2)
Chloride: 107 mEq/L (ref 96–112)
Creatinine, Ser: 0.63 mg/dL (ref 0.4–1.2)
GFR calc Af Amer: >60 mL/min (ref >60)
Potassium: 3.9 mEq/L (ref 3.5–5.1)
Sodium: 143 mEq/L (ref 135–145)
Total Bilirubin: 0.5 mg/dL (ref 0.3–1.2)

## 2010-06-27 LAB — PROTIME-INR: Prothrombin Time: 13.9 seconds (ref 11.6–15.2)

## 2010-06-27 LAB — APTT: aPTT: 27 seconds (ref 24–37)

## 2010-06-28 ENCOUNTER — Other Ambulatory Visit: Payer: Self-pay | Admitting: Surgery

## 2010-06-28 ENCOUNTER — Inpatient Hospital Stay (HOSPITAL_COMMUNITY)
Admission: RE | Admit: 2010-06-28 | Discharge: 2010-06-30 | DRG: 583 | Disposition: A | Discharge status: Home / Self Care | Payer: Medicare Other | Source: Ambulatory Visit | Attending: Surgery | Admitting: Surgery

## 2010-06-28 DIAGNOSIS — C50919 Malignant neoplasm of unspecified site of unspecified female breast: Principal | ICD-10-CM | POA: Diagnosis present

## 2010-06-28 HISTORY — PX: MASTECTOMY: SHX3

## 2010-06-28 LAB — URINALYSIS, ROUTINE W REFLEX MICROSCOPIC
Hgb urine dipstick: NEGATIVE
Ketones, ur: NEGATIVE mg/dL
Urine Glucose, Fasting: NEGATIVE mg/dL
pH: 6 (ref 5.0–8.0)

## 2010-06-29 NOTE — Op Note (Signed)
[name], [name]              ACCOUNT NO.:  [id number]  MEDICAL RECORD NO.:  [id number]           PATIENT TYPE:  I  LOCATION:  5153                         FACILITY:  [facility]  PHYSICIAN:  Currie Paris, M.D.DATE OF BIRTH:  June 22, 1945  DATE OF PROCEDURE:  06/28/2010 DATE OF DISCHARGE:                              OPERATIVE REPORT   PREOPERATIVE DIAGNOSES: 1. Recurrent carcinoma, right breast. 2. 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  PROCEDURE:  Bilateral total mastectomy.  SURGEON:  Currie Paris, MD  ANESTHESIA:  General.  CLINICAL HISTORY:  This is a 65 year old lady who had a right breast cancer a few years ago treated with lumpectomy and radiation therapy. She has developed a bony metastasis because it was controlled well with anti-estrogen therapy.  A recent PET scan showed axillary metastases in the left axilla which were from a breast primary that was receptor negative.  There was suspected an occult primary in the left breast.  As part of her evaluation, a new right breast cancer was found and this in the lower outer quadrant which was receptor positive.  After lengthy discussion with oncologist and with the patient, it was elected to proceed to bilateral total mastectomies.  She had a previous left axillary dissection for her known left axillary metastasis.  DESCRIPTION OF PROCEDURE:  I saw the patient in the holding area and we confirmed the plans for the procedure as noted above.  She had no further questions.  She was taken to the operating room and after satisfactory general anesthesia had been obtained, the breasts were prepped and draped.  I started on the right side.  This side was smaller.  There was notable radiation changes.  I outlined an elliptical incision and made this.  I raised a skin flap superiorly towards the clavicle, medial towards the sternum, inferiorly to just beyond inframammary fold, and laterally to latissimus and  removed the breast from medial to lateral.  Bleeders were either coagulated or clipped.  There was some chronic tissue edema suggestive of radiation changes present.  Once breast was removed, I spent several minutes irrigating making sure everything was dry.  I then put a 19 Blake drain in and secured it with 2-0 nylon.  We did a final irrigation and everything looked to dry, so I closed in layers with 3-0 Vicryl and 4-0 Monocryl subcuticular.  Attention was turned to the left side.  Here, I made somewhat larger skin incision to take down more skin and then made flaps identical to the left side and then out towards the axilla where there was small residual seroma noted and was opened.  The breast was removed inferomedial to lateral and again took the fascia.  I irrigated and made sure everything was dry.  I had excess skin, so I trimmed some that off little ahead down on the right side and then irrigated again. Everything looked to be dry, so I placed a 19 Blake drain and secured it with 2-0 nylon.  A final irrigation was done and then the incision was closed with 3-0 Vicryl and 4-0 Monocryl subcuticular and Steri-Strips. We checked on  right side and after completion on left side and there had been bleeding during the portion of case involving the left as the second.  Dry sterile dressings were applied.  The patient tolerated the procedure well.  There were no complications.  All counts were correct.     Currie Paris, M.D.     CJS/MEDQ  D:  06/28/2010  T:  06/29/2010  Job:  [id number]  cc:   Genene Churn. Cyndie Chime, M.D.  Electronically Signed by Cyndia Bent M.D. on 06/29/2010 04:11:48 PM

## 2010-06-30 LAB — PROTIME-INR: Prothrombin Time: 13.7 seconds (ref 11.6–15.2)

## 2010-07-03 ENCOUNTER — Other Ambulatory Visit: Payer: Self-pay | Admitting: Oncology

## 2010-07-03 ENCOUNTER — Encounter (HOSPITAL_BASED_OUTPATIENT_CLINIC_OR_DEPARTMENT_OTHER): Payer: Medicare Other | Admitting: Oncology

## 2010-07-03 DIAGNOSIS — C7951 Secondary malignant neoplasm of bone: Secondary | ICD-10-CM

## 2010-07-03 DIAGNOSIS — C50419 Malignant neoplasm of upper-outer quadrant of unspecified female breast: Secondary | ICD-10-CM

## 2010-07-03 DIAGNOSIS — Z5111 Encounter for antineoplastic chemotherapy: Secondary | ICD-10-CM

## 2010-07-03 LAB — CBC WITH DIFFERENTIAL/PLATELET
Eosinophils Absolute: 0.2 10*3/uL (ref 0.0–0.5)
MONO#: 0.5 10*3/uL (ref 0.1–0.9)
NEUT#: 4.9 10*3/uL (ref 1.5–6.5)
RBC: 4.25 10*6/uL (ref 3.70–5.45)
RDW: 13 % (ref 11.2–14.5)
WBC: 6.6 10*3/uL (ref 3.9–10.3)

## 2010-07-03 LAB — LACTATE DEHYDROGENASE: LDH: 160 U/L (ref 94–250)

## 2010-07-03 LAB — COMPREHENSIVE METABOLIC PANEL
Albumin: 4 g/dL (ref 3.5–5.2)
CO2: 29 mEq/L (ref 19–32)
Chloride: 103 mEq/L (ref 96–112)
Glucose, Bld: 104 mg/dL — ABNORMAL HIGH (ref 70–99)
Potassium: 4.2 mEq/L (ref 3.5–5.3)
Sodium: 141 mEq/L (ref 135–145)
Total Protein: 6.2 g/dL (ref 6.0–8.3)

## 2010-07-03 LAB — CANCER ANTIGEN 27.29: CA 27.29: 23 U/mL (ref 0–39)

## 2010-07-05 NOTE — Letter (Signed)
Summary: [facility]  [facility]   Imported By: Rexene Alberts 06/27/2010 14:31:36  _____________________________________________________________________  External Attachment:    Type:   Image     Comment:   External Document

## 2010-07-11 ENCOUNTER — Encounter: Payer: Medicare Other | Admitting: Oncology

## 2010-07-11 ENCOUNTER — Ambulatory Visit: Payer: Medicare Other | Attending: Radiation Oncology | Admitting: Radiation Oncology

## 2010-07-11 DIAGNOSIS — Z170 Estrogen receptor positive status [ER+]: Secondary | ICD-10-CM | POA: Insufficient documentation

## 2010-07-11 DIAGNOSIS — Z809 Family history of malignant neoplasm, unspecified: Secondary | ICD-10-CM | POA: Insufficient documentation

## 2010-07-11 DIAGNOSIS — Z9010 Acquired absence of unspecified breast and nipple: Secondary | ICD-10-CM | POA: Insufficient documentation

## 2010-07-11 DIAGNOSIS — Z800 Family history of malignant neoplasm of digestive organs: Secondary | ICD-10-CM | POA: Insufficient documentation

## 2010-07-11 DIAGNOSIS — Z87891 Personal history of nicotine dependence: Secondary | ICD-10-CM | POA: Insufficient documentation

## 2010-07-11 DIAGNOSIS — C50919 Malignant neoplasm of unspecified site of unspecified female breast: Secondary | ICD-10-CM | POA: Insufficient documentation

## 2010-07-11 DIAGNOSIS — Z79899 Other long term (current) drug therapy: Secondary | ICD-10-CM | POA: Insufficient documentation

## 2010-07-11 DIAGNOSIS — Z7901 Long term (current) use of anticoagulants: Secondary | ICD-10-CM | POA: Insufficient documentation

## 2010-07-11 DIAGNOSIS — Z801 Family history of malignant neoplasm of trachea, bronchus and lung: Secondary | ICD-10-CM | POA: Insufficient documentation

## 2010-07-11 DIAGNOSIS — C773 Secondary and unspecified malignant neoplasm of axilla and upper limb lymph nodes: Secondary | ICD-10-CM | POA: Insufficient documentation

## 2010-07-11 DIAGNOSIS — Z8042 Family history of malignant neoplasm of prostate: Secondary | ICD-10-CM | POA: Insufficient documentation

## 2010-07-11 DIAGNOSIS — C7951 Secondary malignant neoplasm of bone: Secondary | ICD-10-CM | POA: Insufficient documentation

## 2010-07-19 NOTE — Discharge Summary (Signed)
  NAMESONA, NATIONS              ACCOUNT NO.:  [id number]  MEDICAL RECORD NO.:  [id number]           PATIENT TYPE:  I  LOCATION:  5153                         FACILITY:  [facility]  PHYSICIAN:  Currie Paris, M.D.DATE OF BIRTH:  1945-11-11  DATE OF ADMISSION:  06/28/2010 DATE OF DISCHARGE:  06/30/2010                              DISCHARGE SUMMARY   FINAL DIAGNOSES: 1. Carcinoma, right breast, lower outer quadrant, stage II, ER     positive, PR negative, Ki-67, 32%, HER-2 negative. 2. History of metastatic carcinoma to left axilla, status post     axillary dissection.  CLINICAL HISTORY:  This patient has been treated in the past by a right lumpectomy node dissection and chemotherapy for an invasive carcinoma of the right breast.  Recently, she was evaluated for further metastatic disease and she had known bone metastases and was found to have left axillary carcinoma which was completely receptor negative and thought to represent a new carcinoma.  She underwent a node dissection.  As part of her evaluation, a suspicious area was found in the right breast and this was biopsied, and found to be carcinoma and was in a different quadrant from her original right breast cancer.  No left breast cancer as a primary for her left axillary metastases could be identified.  After lengthy conversations with the patient and her oncologist, she elected to have a right total mastectomy as a treatment of her newly- diagnosed invasive right breast cancer.  In addition, even though no primary could be identified at left breast, she elected to proceed to a left mastectomy.  She planned no reconstructions at this time.  HOSPITAL COURSE:  The patient was admitted and taken to the operating room where bilateral mastectomies were performed.  She tolerated the procedure well.  Postoperatively, she did well.  She was able to start increasing her activity.  She was restarted on her Coumadin the  day following surgery which she takes for a history of cardiac issues.  She was placed on heparin t.i.d. subcutaneously in the interim.  By the second postoperative day, she was able to be discharged in satisfactory condition.  She knew how to manage her drains.  She was comfortable in taking oral pain medications.  Pathology report showed an invasive ductal carcinoma in the right breast 2.2 cm, margins not involved.  The left breast showed fibrosis but no evidence of malignancy on close inspection.  Laboratory studies are noted in the chart, were basically unremarkable.  The patient will be followed by me for postoperative visits as well as by her oncologist, Dr. Cyndie Chime.     Currie Paris, M.D.     CJS/MEDQ  D:  07/17/2010  T:  07/17/2010  Job:  [id number]  cc:   Genene Churn. Cyndie Chime, M.D., F.A.C.P.  Electronically Signed by Cyndia Bent M.D. on 07/19/2010 07:41:00 AM

## 2010-07-30 ENCOUNTER — Encounter (HOSPITAL_BASED_OUTPATIENT_CLINIC_OR_DEPARTMENT_OTHER): Payer: Medicare Other | Admitting: Oncology

## 2010-07-30 ENCOUNTER — Other Ambulatory Visit: Payer: Self-pay | Admitting: Oncology

## 2010-07-30 DIAGNOSIS — C50419 Malignant neoplasm of upper-outer quadrant of unspecified female breast: Secondary | ICD-10-CM

## 2010-07-30 DIAGNOSIS — C50919 Malignant neoplasm of unspecified site of unspecified female breast: Secondary | ICD-10-CM

## 2010-07-30 DIAGNOSIS — C7952 Secondary malignant neoplasm of bone marrow: Secondary | ICD-10-CM

## 2010-07-30 DIAGNOSIS — Z5111 Encounter for antineoplastic chemotherapy: Secondary | ICD-10-CM

## 2010-07-30 DIAGNOSIS — R1013 Epigastric pain: Secondary | ICD-10-CM

## 2010-07-30 LAB — CBC WITH DIFFERENTIAL/PLATELET
Basophils Absolute: 0 10*3/uL (ref 0.0–0.1)
EOS%: 2.8 % (ref 0.0–7.0)
HGB: 13.6 g/dL (ref 11.6–15.9)
MCH: 31.3 pg (ref 25.1–34.0)
MCV: 89.7 fL (ref 79.5–101.0)
MONO%: 7.6 % (ref 0.0–14.0)
RBC: 4.36 10*6/uL (ref 3.70–5.45)
RDW: 13.3 % (ref 11.2–14.5)

## 2010-07-30 LAB — COMPREHENSIVE METABOLIC PANEL
AST: 18 U/L (ref 0–37)
Albumin: 3.9 g/dL (ref 3.5–5.2)
Alkaline Phosphatase: 76 U/L (ref 39–117)
BUN: 12 mg/dL (ref 6–23)
Potassium: 4 mEq/L (ref 3.5–5.3)
Total Bilirubin: 0.4 mg/dL (ref 0.3–1.2)

## 2010-08-01 LAB — CBC
HCT: 44 % (ref 36.0–46.0)
Hemoglobin: 14.9 g/dL (ref 12.0–15.0)
MCHC: 33.8 g/dL (ref 30.0–36.0)
MCV: 90.2 fL (ref 78.0–100.0)
RBC: 4.88 MIL/uL (ref 3.87–5.11)

## 2010-08-01 LAB — PROTIME-INR: Prothrombin Time: 16 seconds — ABNORMAL HIGH (ref 11.6–15.2)

## 2010-08-02 LAB — BONE MARROW EXAM: Bone Marrow Exam: 351

## 2010-08-02 LAB — CHROMOSOME ANALYSIS, BONE MARROW

## 2010-08-06 LAB — BASIC METABOLIC PANEL
Calcium: 9.1 mg/dL (ref 8.4–10.5)
GFR calc Af Amer: >60 mL/min (ref >60)
GFR calc non Af Amer: >60 mL/min (ref >60)
Potassium: 3.9 mEq/L (ref 3.5–5.1)
Sodium: 142 mEq/L (ref 135–145)

## 2010-08-06 LAB — APTT: aPTT: 37 seconds (ref 24–37)

## 2010-08-06 LAB — CBC
HCT: 43.4 % (ref 36.0–46.0)
Hemoglobin: 14.6 g/dL (ref 12.0–15.0)
WBC: 7.3 10*3/uL (ref 4.0–10.5)

## 2010-08-06 LAB — PROTIME-INR: INR: 2.5 — ABNORMAL HIGH (ref 0.00–1.49)

## 2010-09-11 NOTE — Assessment & Plan Note (Signed)
NAMEMarland Kitchen  [name], [name]               CHART#:  [id number]   DATE:  04/07/2008                       DOB:  [birth year]   FOLLOWUP:  A 1-year followup of history of colonic adenomas.  Surveillance exam back in 2007 demonstrated normal rectum, pancolonic  diverticula, otherwise, colonic mucosa appeared normal.  She is slated  for a stress exam in 2012.  She does have positive family history of  colon cancer in her mother.  She is followed primarily now by Dr.  Linward Foster.  She has history of gastroesophageal reflux disease.  These symptoms are quiescent now.  She has really gotten by nicely with  Pepcid Complete on a p.r.n. basis.  She is no longer on a PPI.  No  odynophagia, no dysphagia.  She does have a history of breast cancer and  is followed by Dr. Cyndie Chime.  Followup scans now in a good 3 years  out indicate no recurrent.  She is on tamoxifen.  She has had lower  extremity superficial phlebitis/thrombosis for which she is now being  committed to long-term Coumadin therapy.  Overall, she is not having any  GI symptoms currently.   CURRENT MEDICATIONS:  See updated list.   ALLERGIES:  Percodan, codeine, and Novocain.   PHYSICAL EXAMINATION:  GENERAL:  Today, she appears well.  VITAL SIGNS:  Weight 186.5, height 5 feet 3 inches, temperature 98, BP  130/86, and pulse 64.  CHEST:  Lungs are clear to auscultation.  CARDIAC:  Regular rate and rhythm without murmur, gallop, or rub.  ABDOMEN:  Somewhat obese, positive bowel sounds, soft, nontender without  appreciable mass or organomegaly.   ASSESSMENT:  1. A gastroesophageal reflux disease symptom is quiescent.  She is      doing well on a p.r.n. antacid/H2 blocker therapy.  2. History of colonic adenoma, positive family history of colon      cancer.  We would be due for surveillance in 2012.  Not mentioned      above, she does have some occasional gas bloat symptoms and does      have background of lactose intolerance.  She  does avoid lactose-      containing products.  I gave her some samples of Digestive      Advantage for gas bloat.  She      is to take 1 daily and see how she likes it.  If that works out      for, she can continue on that regimen.  Unless something comes up,      I will plan to see this nice lady back in 1 year.       [name], M.D.  Electronically Signed     RMR/MEDQ  D:  04/07/2008  T:  04/08/2008  Job:  [id number]   cc:   Linward Foster

## 2010-09-11 NOTE — Assessment & Plan Note (Signed)
NAMEMarland Kitchen  [name], [name]               CHART#:  [id number]   DATE:  04/01/2007                       DOB:  06/03/1945   HISTORY OF PRESENT ILLNESS:  Follow up history of colonic polyps,  gastroesophageal reflux disease. This nice lady was last seen by me at  the time of colonoscopy back on 03/24/2006. She had a history of colonic  adenomas previous removed. She was found to have pancolonic diverticula,  otherwise a normal appearing rectum and colon. It was recommended that  she return in 2012 for__________ examination. She is back stating that  she had chemotherapy and radiation over in [place] for her breast cancer  and is said to be in remission. She developed superficial blood clot of  the left lower extremity for which she has been on Coumadin now for  several weeks. There was concern that this might have been related to  Tamoxifen, which has now been stopped. Reflux symptoms are not a  problem. Now, she has two episodes weekly for which she takes Protonix  on the order of 40 mg twice weekly. It causes diarrhea and gives her  nausea. She does complain of right lower anterior rib cage pain and mid-  back pain, which is somewhat unusual for her over the past several  weeks. She is scheduled to have an MRI of her breasts in February.   [name] broke down and cried today relating the death of her husband  in 2022/10/11 after a lengthy illness at [facility].   CURRENT MEDICATIONS:  See the updated list.   ALLERGIES:  PERCODAN, NOVOCAIN, CODEINE.   PHYSICAL EXAMINATION:  GENERAL:  Tearful 65 year old lady resting  comfortably.  VITAL SIGNS:  Weight 188 pounds, this is up 2 pounds since her last  visit. Height 5 foot 3 inches, temperature 97.8. blood pressure 138/100,  pulse 76.  SKIN:  Warm and dry. There is no jaundice and no scleral icterus.  CHEST:  Lungs are clear to auscultation.  CARDIAC:  Regular rate and rhythm with no murmurs, rubs, or gallops. She  does have  focal tenderness mid-axillary line along her right rib margin.  She also has some diffuse tenderness to percussion mid-back.  ABDOMEN:  Somewhat obese, positive bowel sounds, entirely soft and  nontender without appreciable mass or organomegaly.   ASSESSMENT:  1. GERD symptoms are well controlled on occasional PPI use. She has a      curious side effect with Protonix. We will ask her to stop the PPI      and just to use Pepcid Complete on a p.r.n. basis and watch her      diet.  2. History of colonic adenomas. Negative colonoscopy last year aside      from diverticulosis. Recommend surveillance in five years.  3. Concerned about her musculoskeletal pain involving her right      anterior rib cage and her back. I will call Dr. Cyndie Chime and see      what he thinks and see what imaging studies may be in order to      further evaluate her musculoskeletal pain in the setting of breast      cancer.   Further recommendation is to follow in the near future.       Jonathon Bellows, M.D.  Electronically Signed  RMR/MEDQ  D:  04/01/2007  T:  04/02/2007  Job:  [id number]

## 2010-09-11 NOTE — Op Note (Signed)
[name], [name]              ACCOUNT NO.:  [id number]   MEDICAL RECORD NO.:  [id number]          PATIENT TYPE:  AMB   LOCATION:  SDS                          FACILITY:  [facility]   PHYSICIAN:  Chalmers Guest, M.D.     DATE OF BIRTH:  21-Jul-1945   DATE OF PROCEDURE:  10/06/2008  DATE OF DISCHARGE:                               OPERATIVE REPORT   PREOPERATIVE DIAGNOSIS:  Visually significant cataract, left eye.   POSTOPERATIVE DIAGNOSIS:  Visually significant cataract, left eye.   PROCEDURE:  Phacoemulsification with intraocular lens implant, left eye.   COMPLICATIONS:  None.   ANESTHESIA:  Consisted of 2% Xylocaine with epinephrine 50:50 mixture  with 0.75% Marcaine with an ampule of Wydase.   PROCEDURE IN DETAIL:  The patient was given a peribulbar block with the  aforementioned local anesthetic agent under monitored anesthesia.  Following this with the surgeon sitting temporally and a operating  microscope in position, a Weck-cel sponge was used to fixate the globe  and a Supersharp blade was used to enter the eye through clear cornea at  the 5 o'clock position and Viscoat was injected in the eye.  Following  this, an additional Weck-cel was used nasally and a 2.75 mm keratome  blade was used in a stepwise fashion to enter the clear cornea  temporally.  Following this, additional viscoelastic was injected.  A  bent 25-gauge needle was used to incise the anterior capsular.  A  curvilinear capsulorrhexis was performed.  The capsulotomy forceps were  used to remove the anterior capsule.  BSS was used to hydrodissect and  hydrodelineate the nucleus.  Following this, the phacoemulsification  unit was then introduced into the eye.  It was noted that the eye was a  very large eye, very deep.  With the lens point posteriorly,  phacoemulsification was carried out, sculpting the central trough and  then cracking the nucleus and then removing the complete nucleus and  nucleus shell.   Following this, the I/A was used strip cortical fibers,  stripping fibers off the posterior capsule.  The posterior capsule was  then vacuumed and the posterior capsule remained intact.  Therefore,  additional viscoelastic was injected.  The intraocular lens implant was  called for.  The initial implant was a 10.5 diopter AcrySof lens.  The  AcrySof lens was expired according to the circulator.  Therefore, this  lens could not be used and there was not a replacement for a 10.5  diopter lens.  Therefore, an 11.0 diopter lens had to be used.  This was  the next closest power.  The lens was placed in the lens shooter.  It  was injected in the eye and unfolded appropriately.  It was centered.  The lens is a model [id number] 11.0 diopter lens, SN number [id number].  Following this, the I/A was used to remove viscoelastic from the eye and  the chamber deepened.  At this point, the patient experienced some pain  as the pupil was widely dilated.  However, the I/A was continued to  attempt to remove as much viscoelastic as possible.  It  was removed and  after the viscoelastic had been removed, a single 10-0 nylon  suture was placed to seal the incision.  Miostat was injected in the  eye.  There was no leakage.  Topical Pilopine gel was placed on the eye  as well as TobraDex ointment.  A patch and Fox shield were placed and  the patient returned to recovery area in stable condition.      Chalmers Guest, M.D.  Electronically Signed     Chalmers Guest, M.D.  Electronically Signed    RW/MEDQ  D:  10/06/2008  T:  10/07/2008  Job:  [id number]

## 2010-09-14 ENCOUNTER — Encounter (INDEPENDENT_AMBULATORY_CARE_PROVIDER_SITE_OTHER): Payer: Self-pay | Admitting: Surgery

## 2010-09-14 NOTE — H&P (Signed)
[name]              ACCOUNT NO.:  [id number]   MEDICAL RECORD NO.:  [id number]          PATIENT TYPE:  AMB   LOCATION:                                FACILITY:  [facility]   PHYSICIAN:  R. Roetta Sessions, M.D. DATE OF BIRTH:  January 22, 1946   DATE OF ADMISSION:  DATE OF DISCHARGE:  LH                                HISTORY & PHYSICAL   CHIEF COMPLAINT:  History of colonic adenomas, here for surveillance  colonoscopy.   [name] is a 65 year old lady who has a history of colonic  adenomas removed from her colon previously.  Her last colonoscopy was in  2002 by me.  At that time she had a small polyp removed that turned out to  be hyperplastic.  She has done well and is here for surveillance.  She is  not having any bowel symptoms currently.  Since I last saw her in November  2006, she has really been through quite a bit.  She was diagnosed with  breast cancer.  She had a lumpectomy.  She had 7 out of 12 nodes positive.  She has received adjuvant chemotherapy and radiation therapy under the  direction of Drs. Dorna Bloom and [place].  She is said to be doing well and has  responded to therapy.  She had not had any rectal bleeding, melena, or any  other change in bowel habits recently.   She has a history of gastroesophageal reflux disease.  Those symptoms are  well-controlled on Protonix 40 mg orally daily.  In addition to her personal  history of colonic adenomas, her mother succumbed to colorectal cancer at  age 69.   PAST MEDICAL HISTORY:  1. Gastroesophageal reflux disease.  2. Right lower quadrant abdominal pain previously that turned out to be      related to a right upper quadrant, for which she underwent repair by      Sherilyn Cooter A. Cleotis Nipper, M.D.  3. History of a parathyroid nodule, for which she was seen by Dr. Evlyn Kanner      down in [place].  4. Multinodular thyroid goiter.  5. History of sigmoid diverticulitis.  6. History of left foot surgery.  7.  Cholecystectomy.  8. History of colonic adenomas removed previously.  Multiple      colonoscopies, the last 2002.  9. History of breast cancer with treatment as outlined above.   CURRENT MEDICATIONS:  1. Percocet 5/325 mg p.r.n.  2. Protonix 40 mg daily.  3. Calcium supplement daily.   ALLERGIES:  CODEINE, PERCOCET, EPINEPHRINE.   FAMILY HISTORY:  As outlined above.   SOCIAL HISTORY:  The patient is married.  No children.  She lives in [place],  [place].  No tobacco or alcohol.   PHYSICAL EXAMINATION:  GENERAL:  A pleasant 65 year old lady resting  comfortably.  VITAL SIGNS:  Weight 186, height 5 feet 2inches.  Temperature 98, BP 146/92,  pulse 68.  SKIN:  Warm and dry.  There is no jaundice.  CHEST:  Lungs are clear to auscultation.  CARDIAC:  Regular rate and rhythm without murmur, gallop, or rub.  BREASTS:  Deferred.  ABDOMEN:  Nondistended, positive bowel sounds, soft, nontender, without  appreciable mass or organomegaly.  EXTREMITIES:  No edema.  RECTAL:  Deferred to the time of colonoscopy.   IMPRESSION:  [name] is a 65 year old lady with a personal  history of colonic adenomas, with a negative colonoscopy in 2002.  She has a  positive family history of colorectal cancer in a first degree relative at a  relatively young age.  Unfortunately, she has now been diagnosed with breast  cancer, has been doing fairly well with the treatment for this latest  problem.   It is time for a surveillance colonoscopy.  I have recommended this to Ms.  Bohannon.  We talked about the potential risks, benefits, and alternatives of  this approach.  She is agreeable to having the surveillance colonoscopy, and  we will make plans to have her undergo the procedure in the near future at  [facility] and make further recommendations at that time.      Jonathon Bellows, M.D.  Electronically Signed     RMR/MEDQ  D:  02/21/2006  T:  02/22/2006  Job:  [id number]   cc:    Ernestina Penna  Fax: [phone]   Genene Churn. Cyndie Chime, M.D.  Fax: [phone]

## 2010-10-16 ENCOUNTER — Encounter (HOSPITAL_COMMUNITY)
Admission: RE | Admit: 2010-10-16 | Discharge: 2010-10-16 | Disposition: A | Discharge status: Home / Self Care | Payer: Medicare Other | Source: Ambulatory Visit | Attending: Oncology | Admitting: Oncology

## 2010-10-16 ENCOUNTER — Encounter (HOSPITAL_COMMUNITY): Payer: Self-pay

## 2010-10-16 DIAGNOSIS — Z9010 Acquired absence of unspecified breast and nipple: Secondary | ICD-10-CM | POA: Insufficient documentation

## 2010-10-16 DIAGNOSIS — Z79899 Other long term (current) drug therapy: Secondary | ICD-10-CM | POA: Insufficient documentation

## 2010-10-16 DIAGNOSIS — C50919 Malignant neoplasm of unspecified site of unspecified female breast: Secondary | ICD-10-CM | POA: Insufficient documentation

## 2010-10-16 LAB — GLUCOSE, CAPILLARY: Glucose-Capillary: 111 mg/dL — ABNORMAL HIGH (ref 70–99)

## 2010-10-16 MED ORDER — FLUDEOXYGLUCOSE F - 18 (FDG) INJECTION
16.5000 | Freq: Once | INTRAVENOUS | Status: AC | PRN
  Administered 2010-10-16: 16.5 via INTRAVENOUS

## 2010-10-18 ENCOUNTER — Encounter (INDEPENDENT_AMBULATORY_CARE_PROVIDER_SITE_OTHER): Payer: Self-pay | Admitting: Surgery

## 2010-10-23 ENCOUNTER — Ambulatory Visit (INDEPENDENT_AMBULATORY_CARE_PROVIDER_SITE_OTHER): Payer: Self-pay | Admitting: Surgery

## 2010-10-23 ENCOUNTER — Other Ambulatory Visit: Payer: Self-pay | Admitting: Oncology

## 2010-10-23 ENCOUNTER — Encounter (HOSPITAL_BASED_OUTPATIENT_CLINIC_OR_DEPARTMENT_OTHER): Payer: Medicare Other | Admitting: Oncology

## 2010-10-23 DIAGNOSIS — Z5111 Encounter for antineoplastic chemotherapy: Secondary | ICD-10-CM

## 2010-10-23 DIAGNOSIS — Z853 Personal history of malignant neoplasm of breast: Secondary | ICD-10-CM | POA: Insufficient documentation

## 2010-10-23 DIAGNOSIS — C50419 Malignant neoplasm of upper-outer quadrant of unspecified female breast: Secondary | ICD-10-CM

## 2010-10-23 DIAGNOSIS — C7951 Secondary malignant neoplasm of bone: Secondary | ICD-10-CM

## 2010-10-23 DIAGNOSIS — C7952 Secondary malignant neoplasm of bone marrow: Secondary | ICD-10-CM

## 2010-10-23 LAB — COMPREHENSIVE METABOLIC PANEL
ALT: 20 U/L (ref 0–35)
CO2: 30 mEq/L (ref 19–32)
Calcium: 9.5 mg/dL (ref 8.4–10.5)
Chloride: 101 mEq/L (ref 96–112)
Glucose, Bld: 103 mg/dL — ABNORMAL HIGH (ref 70–99)
Sodium: 139 mEq/L (ref 135–145)
Total Protein: 6.6 g/dL (ref 6.0–8.3)

## 2010-10-23 LAB — CBC WITH DIFFERENTIAL/PLATELET
BASO%: 0.2 % (ref 0.0–2.0)
Eosinophils Absolute: 0.2 10*3/uL (ref 0.0–0.5)
HCT: 39 % (ref 34.8–46.6)
LYMPH%: 11.2 % — ABNORMAL LOW (ref 14.0–49.7)
MCHC: 34.2 g/dL (ref 31.5–36.0)
MONO#: 0.7 10*3/uL (ref 0.1–0.9)
NEUT#: 3.8 10*3/uL (ref 1.5–6.5)
NEUT%: 71.9 % (ref 38.4–76.8)
Platelets: 254 10*3/uL (ref 145–400)
RBC: 4.39 10*6/uL (ref 3.70–5.45)
WBC: 5.3 10*3/uL (ref 3.9–10.3)
lymph#: 0.6 10*3/uL — ABNORMAL LOW (ref 0.9–3.3)

## 2010-10-23 LAB — LACTATE DEHYDROGENASE: LDH: 194 U/L (ref 94–250)

## 2010-10-23 LAB — CANCER ANTIGEN 27.29: CA 27.29: 26 U/mL (ref 0–39)

## 2010-10-23 NOTE — Progress Notes (Signed)
The patient returns for followup of her bilateral mastectomies. Since I last saw her she has completed her radiation therapy. She apparently developed some erythema on the left mastectomy site and was treated with about 10 days of cephalexin. This has improved. Overall she feels she is stable.  Exam: The patient is alert butremains angry over her diagnosis..  Breasts: she is status post bilateral mastectomies. On the right the skin is very thin. There is no evidence of recurrence or infection. On the left side the skin is a little bit thicker and there is slight erythema. However this does not appear to be tender or painful. I don't believe there is any cellulitis.  Impression: Stable exam post bilateral mastectomies in a patient with stage IV breast cancer.  Plan: She is followed at close intervals by her oncologist. I told her we would see her here again PRN.

## 2010-11-23 ENCOUNTER — Ambulatory Visit (INDEPENDENT_AMBULATORY_CARE_PROVIDER_SITE_OTHER): Payer: Medicare Other | Admitting: Surgery

## 2010-11-23 ENCOUNTER — Encounter (INDEPENDENT_AMBULATORY_CARE_PROVIDER_SITE_OTHER): Payer: Self-pay | Admitting: Surgery

## 2010-11-23 VITALS — BP 108/78 | HR 64

## 2010-11-23 DIAGNOSIS — N6489 Other specified disorders of breast: Secondary | ICD-10-CM

## 2010-11-23 NOTE — Progress Notes (Signed)
The patient returns because of some swelling in the lateral area of her right mastectomy scar. It's been present for several weeks. Oncologist asked [place] to check it but thought it was not related to her breast cancer but more likely some wound issue. She continues to have a lot of pain and discomfort from her surgeries.  Exam: There is now a moderate area of swelling above the scar laterally. It feels like fluid. There is no evidence of infection.  Impression: I think this is a recurrent seroma and recommended aspiration.  Procedure I aspirated the area in question. I obtained 85 cc of bloody fluid. Of note is that she is on Coumadin.  Plan: She will come back should the fluid reaccumulated. I told her that is likely

## 2010-12-17 ENCOUNTER — Encounter (INDEPENDENT_AMBULATORY_CARE_PROVIDER_SITE_OTHER): Payer: Medicare Other | Admitting: Surgery

## 2010-12-25 ENCOUNTER — Ambulatory Visit (INDEPENDENT_AMBULATORY_CARE_PROVIDER_SITE_OTHER): Payer: Medicare Other | Admitting: Surgery

## 2010-12-25 VITALS — BP 118/76 | HR 70

## 2010-12-25 DIAGNOSIS — T792XXA Traumatic secondary and recurrent hemorrhage and seroma, initial encounter: Secondary | ICD-10-CM

## 2010-12-25 NOTE — Progress Notes (Signed)
Patient comes back because of redevelopment of the bloody fluid collection near her right axilla post mastectomy it has gotten fairly tight. Other than that she's been doing okay.  Physical exam: The wound from the mastectomy is completely healed. There are some chronic radiation changes of the skin noted. Near the anterior axillary line is a fluid collection consistent with another seroma. She was here last time we aspirated and it was all bloody fluid, suggesting she had some unrecognized trauma.  Impression: Recurrent fluid collection likely bloody  Plan: I think is easier to be aspirated or have a drain placed. I went over the options and she elected to go ahead and have his place a drain.  After sterile prep I was able to introduce a seroma catheter into the cavity. We drained about 60 cc of thin bloody fluid and attached the bulb.  She will call when the drainage stays under about 20 cc a day for a couple of days and we will have her come back to have the drain removed. I think the nurse can remove this if I am not here.

## 2010-12-25 NOTE — Patient Instructions (Signed)
Call when the drain is less than 20cc per day for two days and we will have you come back to the office for drain removal

## 2011-01-01 ENCOUNTER — Ambulatory Visit (INDEPENDENT_AMBULATORY_CARE_PROVIDER_SITE_OTHER): Payer: Medicare Other | Admitting: General Surgery

## 2011-01-01 DIAGNOSIS — Z4803 Encounter for change or removal of drains: Secondary | ICD-10-CM

## 2011-01-01 DIAGNOSIS — Z4889 Encounter for other specified surgical aftercare: Secondary | ICD-10-CM

## 2011-01-01 NOTE — Patient Instructions (Signed)
Call if fluid accumulates again or if any other problems.

## 2011-01-01 NOTE — Progress Notes (Signed)
Patient comes in status post drain placement on 12/25/10 by Dr Jamey Ripa and told to call when drain is under 20 cc. Patient comes in today to have drain removed. It has been under 20 cc for the Holiday weekend but she was unable to get into the office, since it was a holiday. Skin around the drain looks good. No drainage around, or redness. Drain was draining bloody fluid. Drain was removed and area was bandaged with a dry gauze. Patient was instructed to call back if fluid accumulates again or if has any other problems.

## 2011-01-15 ENCOUNTER — Other Ambulatory Visit: Payer: Self-pay | Admitting: Oncology

## 2011-01-15 ENCOUNTER — Encounter (HOSPITAL_BASED_OUTPATIENT_CLINIC_OR_DEPARTMENT_OTHER): Payer: Medicare Other | Admitting: Oncology

## 2011-01-15 DIAGNOSIS — R1013 Epigastric pain: Secondary | ICD-10-CM

## 2011-01-15 DIAGNOSIS — C50419 Malignant neoplasm of upper-outer quadrant of unspecified female breast: Secondary | ICD-10-CM

## 2011-01-15 DIAGNOSIS — C7952 Secondary malignant neoplasm of bone marrow: Secondary | ICD-10-CM

## 2011-01-15 DIAGNOSIS — Z5111 Encounter for antineoplastic chemotherapy: Secondary | ICD-10-CM

## 2011-01-15 LAB — CBC WITH DIFFERENTIAL/PLATELET
EOS%: 1.4 % (ref 0.0–7.0)
Eosinophils Absolute: 0.1 10*3/uL (ref 0.0–0.5)
MCH: 30.6 pg (ref 25.1–34.0)
MCV: 88.6 fL (ref 79.5–101.0)
MONO%: 10 % (ref 0.0–14.0)
NEUT#: 4.9 10*3/uL (ref 1.5–6.5)
RBC: 4.38 10*6/uL (ref 3.70–5.45)
RDW: 13.8 % (ref 11.2–14.5)

## 2011-01-15 LAB — COMPREHENSIVE METABOLIC PANEL
AST: 23 U/L (ref 0–37)
Albumin: 3.6 g/dL (ref 3.5–5.2)
BUN: 19 mg/dL (ref 6–23)
CO2: 30 mEq/L (ref 19–32)
Calcium: 9 mg/dL (ref 8.4–10.5)
Chloride: 103 mEq/L (ref 96–112)
Glucose, Bld: 94 mg/dL (ref 70–99)
Potassium: 3.4 mEq/L — ABNORMAL LOW (ref 3.5–5.3)

## 2011-01-15 LAB — CANCER ANTIGEN 27.29: CA 27.29: 27 U/mL (ref 0–39)

## 2011-01-15 LAB — LACTATE DEHYDROGENASE: LDH: 212 U/L (ref 94–250)

## 2011-01-22 ENCOUNTER — Other Ambulatory Visit: Payer: Self-pay | Admitting: Oncology

## 2011-01-22 DIAGNOSIS — C50919 Malignant neoplasm of unspecified site of unspecified female breast: Secondary | ICD-10-CM

## 2011-01-30 ENCOUNTER — Encounter (HOSPITAL_COMMUNITY): Payer: Self-pay

## 2011-01-30 ENCOUNTER — Encounter (HOSPITAL_COMMUNITY)
Admission: RE | Admit: 2011-01-30 | Discharge: 2011-01-30 | Disposition: A | Discharge status: Home / Self Care | Payer: Medicare Other | Source: Ambulatory Visit | Attending: Oncology | Admitting: Oncology

## 2011-01-30 DIAGNOSIS — C50919 Malignant neoplasm of unspecified site of unspecified female breast: Secondary | ICD-10-CM

## 2011-01-30 MED ORDER — FLUDEOXYGLUCOSE F - 18 (FDG) INJECTION
17.1000 | Freq: Once | INTRAVENOUS | Status: AC | PRN
  Administered 2011-01-30: 17.1 via INTRAVENOUS

## 2011-02-25 ENCOUNTER — Telehealth (INDEPENDENT_AMBULATORY_CARE_PROVIDER_SITE_OTHER): Payer: Self-pay | Admitting: General Surgery

## 2011-02-25 NOTE — Telephone Encounter (Signed)
Patient calling to inquire about stopping coumadin prior to punch biopsy on Friday. Please advise.

## 2011-02-26 NOTE — Telephone Encounter (Signed)
Patient made aware she doesn't need to stop coumadin per Dr Jamey Ripa. We will see in the office on Friday.

## 2011-03-01 ENCOUNTER — Ambulatory Visit (INDEPENDENT_AMBULATORY_CARE_PROVIDER_SITE_OTHER): Payer: Medicare Other | Admitting: Surgery

## 2011-03-01 ENCOUNTER — Encounter (INDEPENDENT_AMBULATORY_CARE_PROVIDER_SITE_OTHER): Payer: Self-pay | Admitting: General Surgery

## 2011-03-01 ENCOUNTER — Encounter (INDEPENDENT_AMBULATORY_CARE_PROVIDER_SITE_OTHER): Payer: Self-pay | Admitting: Surgery

## 2011-03-01 VITALS — BP 130/84 | HR 70 | Temp 97.3°F | Resp 16 | Ht 64.0 in | Wt 176.6 lb

## 2011-03-01 DIAGNOSIS — C50919 Malignant neoplasm of unspecified site of unspecified female breast: Secondary | ICD-10-CM

## 2011-03-01 DIAGNOSIS — R21 Rash and other nonspecific skin eruption: Secondary | ICD-10-CM

## 2011-03-01 NOTE — Patient Instructions (Signed)
Change the dressing as needed. We will call you as soon as we have a report on the pathology

## 2011-03-01 NOTE — Progress Notes (Signed)
Chief complaint: Rash had prior mastectomy site.  History of present illness: This patient is status post a lumpectomy followed by radiation therapy on her right breast. She subsequently had a right mastectomy for recurrence of her breast cancer. She was recently seen by her radiation oncologist who was concerned about some redness of the mastectomy incision area on the right and he asked [place] to see her to do a punch biopsy to rule out a skin recurrence.  Past history family history and review of systems are in electronic medical record and not redictated here.  Exam: The right anterior chest wall status post mastectomy. There is some radiation type neovascularization at the lateral aspect of the scar. However there is a diffuse redness of the scar area extending several centimeters above and below the incision. It seems to be more prominent medial than lateral. I don't recall this being present at her last visit.  Impression: Apparently new rash of anterior chest wall at site of prior mastectomy.  Plan: I think a punch biopsy is appropriate here. I discussed that with the patient. She was already prepared to have one done.  Procedure note: I picked an area where the rash was prominent just below the incision. This was anesthetized with 1 cc of Xylocaine with epinephrine. I waited one minute and it A3 millimeter punch biopsy. There was no bleeding. A sterile dressing was applied.  She will be routine dressing changes. We will call her with the report. We will see her back p.r.n.

## 2011-03-06 ENCOUNTER — Other Ambulatory Visit: Payer: Self-pay | Admitting: *Deleted

## 2011-03-06 DIAGNOSIS — C50419 Malignant neoplasm of upper-outer quadrant of unspecified female breast: Secondary | ICD-10-CM

## 2011-03-06 MED ORDER — HYDROCODONE-ACETAMINOPHEN 5-500 MG PO TABS: ORAL_TABLET | ORAL | Status: DC

## 2011-03-06 NOTE — Telephone Encounter (Signed)
[facility] called requesting refill for patient.  Refill granted at this time.

## 2011-03-14 ENCOUNTER — Telehealth: Payer: Self-pay | Admitting: *Deleted

## 2011-03-14 ENCOUNTER — Other Ambulatory Visit: Payer: Self-pay | Admitting: Oncology

## 2011-03-14 NOTE — Telephone Encounter (Signed)
Received ph call from [facility] requesting pathology report from recent biopsy.  Unable to find report in Epic system, therefore called [facility] & they will fax report to [facility].

## 2011-03-24 ENCOUNTER — Other Ambulatory Visit: Payer: Self-pay | Admitting: Oncology

## 2011-03-25 NOTE — Telephone Encounter (Signed)
OK to renew 

## 2011-03-26 ENCOUNTER — Other Ambulatory Visit: Payer: Self-pay | Admitting: Oncology

## 2011-03-26 DIAGNOSIS — C50919 Malignant neoplasm of unspecified site of unspecified female breast: Secondary | ICD-10-CM

## 2011-03-26 NOTE — Telephone Encounter (Signed)
Received call from [facility] stating they were waiting on a refill of hydrocodone for pt.  They report that they received a denial & a new script would follow.  Dr. Cyndie Chime had received refill request & he gave OK for refill on 03/25/11, therefore [place] notified to renew hydrocodone @ [phone].

## 2011-04-11 ENCOUNTER — Telehealth: Payer: Self-pay | Admitting: *Deleted

## 2011-04-11 NOTE — Telephone Encounter (Signed)
Patient called reporting she has been receiving a fifteen day supply of hydrocodone 5/500mg  and needs a larger supply.  Asked if the med is helping her pain.  "I've been on this for 2 years, I've gone through seven cancers and I work nine hour days on my job."  Reports [facility] will be Diplomatic Services operational officer for the refill in a few days and she wants Dr. Cyndie Chime to approve a larger quantity.

## 2011-04-12 ENCOUNTER — Other Ambulatory Visit: Payer: Self-pay | Admitting: Oncology

## 2011-04-12 DIAGNOSIS — C50919 Malignant neoplasm of unspecified site of unspecified female breast: Secondary | ICD-10-CM

## 2011-04-12 MED ORDER — HYDROCODONE-ACETAMINOPHEN 5-500 MG PO TABS: 1.0000 | ORAL_TABLET | Freq: Four times a day (QID) | ORAL | Status: DC | PRN

## 2011-04-24 ENCOUNTER — Telehealth: Payer: Self-pay | Admitting: *Deleted

## 2011-04-24 NOTE — Telephone Encounter (Signed)
Pt. called regarding next appt.  Rose/Scheduler informed pt of appt. in March & pt. Is concerned about faslodex injections & labs.  Returned call to pt & she had last faslodex Dec. 11th & will need further injections scheduled at [facility].  Explained that we would get orders to [facility] & check for other appts with Dr. Cyndie Chime on his return.   Note to be left for Dr. Patsy Lager review.

## 2011-04-30 ENCOUNTER — Other Ambulatory Visit: Payer: Self-pay | Admitting: Oncology

## 2011-04-30 ENCOUNTER — Encounter: Payer: Self-pay | Admitting: Oncology

## 2011-04-30 DIAGNOSIS — C50919 Malignant neoplasm of unspecified site of unspecified female breast: Secondary | ICD-10-CM

## 2011-04-30 DIAGNOSIS — C7951 Secondary malignant neoplasm of bone: Secondary | ICD-10-CM | POA: Insufficient documentation

## 2011-04-30 HISTORY — DX: Malignant neoplasm of unspecified site of unspecified female breast: C50.919

## 2011-04-30 HISTORY — DX: Secondary malignant neoplasm of bone: C79.51

## 2011-05-01 ENCOUNTER — Other Ambulatory Visit: Payer: Self-pay

## 2011-05-01 NOTE — Progress Notes (Signed)
Orders faxed to [facility] for monthly Faslodex for January, Feb, and March.  dph

## 2011-05-10 ENCOUNTER — Other Ambulatory Visit (HOSPITAL_BASED_OUTPATIENT_CLINIC_OR_DEPARTMENT_OTHER): Payer: Medicare PPO | Admitting: Lab

## 2011-05-10 ENCOUNTER — Ambulatory Visit (HOSPITAL_BASED_OUTPATIENT_CLINIC_OR_DEPARTMENT_OTHER): Payer: Medicare PPO | Admitting: Oncology

## 2011-05-10 ENCOUNTER — Telehealth: Payer: Self-pay | Admitting: Oncology

## 2011-05-10 VITALS — BP 164/85 | HR 74 | Temp 98.9°F | Ht 64.0 in | Wt 176.7 lb

## 2011-05-10 DIAGNOSIS — Z7901 Long term (current) use of anticoagulants: Secondary | ICD-10-CM

## 2011-05-10 DIAGNOSIS — Z79899 Other long term (current) drug therapy: Secondary | ICD-10-CM

## 2011-05-10 DIAGNOSIS — I8000 Phlebitis and thrombophlebitis of superficial vessels of unspecified lower extremity: Secondary | ICD-10-CM

## 2011-05-10 DIAGNOSIS — C50919 Malignant neoplasm of unspecified site of unspecified female breast: Secondary | ICD-10-CM

## 2011-05-10 DIAGNOSIS — C7951 Secondary malignant neoplasm of bone: Secondary | ICD-10-CM

## 2011-05-10 LAB — LACTATE DEHYDROGENASE: LDH: 172 U/L (ref 94–250)

## 2011-05-10 LAB — COMPREHENSIVE METABOLIC PANEL
ALT: 14 U/L (ref 0–35)
CO2: 27 mEq/L (ref 19–32)
Creatinine, Ser: 0.55 mg/dL (ref 0.50–1.10)
Total Bilirubin: 0.5 mg/dL (ref 0.3–1.2)

## 2011-05-10 LAB — CBC WITH DIFFERENTIAL/PLATELET
BASO%: 0.2 % (ref 0.0–2.0)
EOS%: 0.7 % (ref 0.0–7.0)
HCT: 42.7 % (ref 34.8–46.6)
LYMPH%: 8.9 % — ABNORMAL LOW (ref 14.0–49.7)
MCH: 30.2 pg (ref 25.1–34.0)
MCHC: 33.4 g/dL (ref 31.5–36.0)
MCV: 90.4 fL (ref 79.5–101.0)
NEUT%: 83.8 % — ABNORMAL HIGH (ref 38.4–76.8)
Platelets: 261 10*3/uL (ref 145–400)

## 2011-05-10 LAB — PROTIME-INR: INR: 2.2 (ref 2.00–3.50)

## 2011-05-10 NOTE — Telephone Encounter (Signed)
gve the pt her march 2013 appt calendar along with the cxr/bone scan appt

## 2011-05-10 NOTE — Progress Notes (Signed)
Hematology and Oncology Follow Up Visit  [name] [id number] May 22, 1945 66 y.o. 05/10/2011 5:21 PM   Principle Diagnosis: Encounter Diagnosis  Name Primary?  . Breast cancer metastasized to bone Yes     Interim History:   Subsequent to her visit with me here on 01/15/11 she saw her radiation oncologist Dr. Sharlett Iles in followup. He noticed some increased erythema on the medial aspect of the right mastectomy scar that was suspicious for a cutaneous recurrence. She was referred back to her surgeon Dr. Cristal Deer who did a punch biopsy of this area on 11/2 and unfortunately did come back positive for metastatic adenocarcinoma. She was reevaluated by Dr. Sharlett Iles then completed a recent course of electron beam radiation therapy to the area with a good result. She just had a PET scan on 01/30/2011 which showed nonspecific bilateral chest wall uptake felt to be postsurgical but no systemic uptake including known widespread bone metastases. She remains on monthly Faslodex injections. She is complaining of bilateral lower anterior rib soreness. No pleuritic component. There was no radiating pain when I compressed over her sternum. She has some chronic orthopedic problems with her feet causing pain and difficulty walking. She is still working full-time. 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    Medications: reviewed  Allergies:  Allergies  Allergen Reactions  . Percodan (Oxycodone-Aspirin) Nausea Only  . Codeine Nausea Only    Review of Systems: Constitutional:   Generalized discomfort and foot pain Respiratory: No cough no dyspnea Cardiovascular:  No chest pain or pressure Gastrointestinal: Intermittent diarrhea from irritable bowel syndrome intermittent abdominal cramps Genito-Urinary: No urinary tract symptoms Musculoskeletal: See above Neurologic: No new headache or change in vision Skin: No new skin changes Remaining ROS  negative.  Physical Exam: Blood pressure 164/85, pulse 74, temperature 98.9 F (37.2 C), temperature source Oral, height 5\' 4"  (1.626 m), weight 176 lb 11.2 oz (80.151 kg). Wt Readings from Last 3 Encounters:  05/10/11 176 lb 11.2 oz (80.151 kg)  03/01/11 176 lb 9.6 oz (80.105 kg)  12/08/09 179 lb (81.194 kg)     General appearance: Well-nourished Caucasian woman HENNT: Pharynx no erythema or exudate Lymph nodes: No cervical supraclavicular or axillary adenopathy Breasts: Not with bilateral mastectomies. Radiation changes bilaterally. No erythema or nodularity on the chest wall. Lungs: Clear to auscultation resonant to percussion Heart: Regular cardiac rhythm no murmur Abdomen: Soft nontender no mass no organomegaly Extremities: No edema no calf tenderness Vascular: No cyanosis Neurologic: Motor strength 5 over 5 reflexes 1+ symmetric Skin: No rash or ecchymoses  Lab Results: Lab Results  Component Value Date   WBC 6.9 05/10/2011   HGB 14.3 05/10/2011   HCT 42.7 05/10/2011   MCV 90.4 05/10/2011   PLT 261 05/10/2011     Chemistry      Component Value Date/Time   NA 141 05/10/2011 0935   K 4.2 05/10/2011 0935   CL 105 05/10/2011 0935   CO2 27 05/10/2011 0935   BUN 15 05/10/2011 0935   CREATININE 0.55 05/10/2011 0935      Component Value Date/Time   CALCIUM 9.2 05/10/2011 0935   ALKPHOS 72 05/10/2011 0935   AST 20 05/10/2011 0935   ALT 14 05/10/2011 0935   BILITOT 0.5 05/10/2011 0935       Radiological Studies:    Impression and Plan: #1. Metachronous primary bilateral invasive breast cancers. Initial stage II multiple node positive ER positive cancer right breast diagnosed December  2006 treated with lumpectomy radiation and chemotherapy. Progression to bone while on tamoxifen September 2011. Complete response to monthly Faslodex hormonal injections which she continues at the current time. Second primary presumed left breast detected by abnormal PET activity in the left axilla  November 2011. While under evaluation for this abnormality she was found to have a third primary in the right breast. Bilateral mastectomies done 06/28/2010. No obvious primary found in the left breast tissue but the node was ER negative consistent with a second primary. The 2.2 cm invasive cancer in the right breast was ER positive PR negative HER-2 negative similar to her initial right breast cancer. She received initial radiation to her left chest wall and left supraclavicular region. She developed abnormal erythema medial aspect right mastectomy scar with biopsies consistent with cutaneous metastases as noted above. She recently completed electron beam radiation to this area with good result. Plan: Continue monthly Faslodex injections, ongoing clinical and radiographic followup  #2. History of extensive superficial phlebitis lower extremity. In view of metastatic cancer and ongoing hormonal therapy I elected to put her on full dose Coumadin. INR today is 2.2 on Coumadin 4 mg daily.  #3. Essential hypertension  #4. Irritable bowel syndrome  #5. Chronic orthopedic problems with her feet   CC:. Dr. Cicero Duck, Antony Blackbird, Joya Gaskins, MD 1/11/20135:21 PM

## 2011-05-13 NOTE — Progress Notes (Signed)
Pt's labs mailed to her home per Dr Cyndie Chime. dph

## 2011-05-22 ENCOUNTER — Other Ambulatory Visit: Payer: Self-pay | Admitting: *Deleted

## 2011-05-22 DIAGNOSIS — C50919 Malignant neoplasm of unspecified site of unspecified female breast: Secondary | ICD-10-CM

## 2011-05-22 MED ORDER — HYDROCODONE-ACETAMINOPHEN 5-500 MG PO TABS: 1.0000 | ORAL_TABLET | Freq: Four times a day (QID) | ORAL | Status: DC | PRN

## 2011-06-17 ENCOUNTER — Other Ambulatory Visit: Payer: Self-pay | Admitting: Oncology

## 2011-06-20 ENCOUNTER — Other Ambulatory Visit: Payer: Self-pay | Admitting: *Deleted

## 2011-06-20 DIAGNOSIS — F4541 Pain disorder exclusively related to psychological factors: Secondary | ICD-10-CM

## 2011-06-20 MED ORDER — HYDROCODONE-ACETAMINOPHEN 5-500 MG PO TABS: 1.0000 | ORAL_TABLET | Freq: Four times a day (QID) | ORAL | Status: DC | PRN

## 2011-07-05 ENCOUNTER — Ambulatory Visit: Payer: Medicare Other | Admitting: Oncology

## 2011-07-05 ENCOUNTER — Other Ambulatory Visit: Payer: Medicare Other

## 2011-07-19 ENCOUNTER — Other Ambulatory Visit: Payer: Self-pay

## 2011-07-19 ENCOUNTER — Other Ambulatory Visit (HOSPITAL_BASED_OUTPATIENT_CLINIC_OR_DEPARTMENT_OTHER): Payer: Medicare PPO | Admitting: Lab

## 2011-07-19 ENCOUNTER — Encounter (HOSPITAL_COMMUNITY)
Admission: RE | Admit: 2011-07-19 | Discharge: 2011-07-19 | Disposition: A | Discharge status: Home / Self Care | Payer: Medicare PPO | Source: Ambulatory Visit | Attending: Oncology | Admitting: Oncology

## 2011-07-19 ENCOUNTER — Encounter (HOSPITAL_COMMUNITY): Payer: Self-pay

## 2011-07-19 ENCOUNTER — Other Ambulatory Visit: Payer: Medicare PPO | Admitting: Lab

## 2011-07-19 ENCOUNTER — Ambulatory Visit (HOSPITAL_BASED_OUTPATIENT_CLINIC_OR_DEPARTMENT_OTHER): Payer: Medicare PPO | Admitting: Oncology

## 2011-07-19 VITALS — BP 163/87 | HR 68 | Temp 97.5°F | Ht 64.0 in | Wt 178.6 lb

## 2011-07-19 DIAGNOSIS — F4542 Pain disorder with related psychological factors: Secondary | ICD-10-CM

## 2011-07-19 DIAGNOSIS — M12819 Other specific arthropathies, not elsewhere classified, unspecified shoulder: Secondary | ICD-10-CM

## 2011-07-19 DIAGNOSIS — F4541 Pain disorder exclusively related to psychological factors: Secondary | ICD-10-CM

## 2011-07-19 DIAGNOSIS — C7951 Secondary malignant neoplasm of bone: Secondary | ICD-10-CM

## 2011-07-19 DIAGNOSIS — C773 Secondary and unspecified malignant neoplasm of axilla and upper limb lymph nodes: Secondary | ICD-10-CM

## 2011-07-19 DIAGNOSIS — C50919 Malignant neoplasm of unspecified site of unspecified female breast: Secondary | ICD-10-CM

## 2011-07-19 DIAGNOSIS — C44599 Other specified malignant neoplasm of skin of other part of trunk: Secondary | ICD-10-CM

## 2011-07-19 DIAGNOSIS — C7952 Secondary malignant neoplasm of bone marrow: Secondary | ICD-10-CM | POA: Insufficient documentation

## 2011-07-19 DIAGNOSIS — M1990 Unspecified osteoarthritis, unspecified site: Secondary | ICD-10-CM

## 2011-07-19 LAB — COMPREHENSIVE METABOLIC PANEL
ALT: 18 U/L (ref 0–35)
AST: 23 U/L (ref 0–37)
Albumin: 4.3 g/dL (ref 3.5–5.2)
Calcium: 9.5 mg/dL (ref 8.4–10.5)
Chloride: 105 mEq/L (ref 96–112)
Potassium: 4.2 mEq/L (ref 3.5–5.3)
Sodium: 142 mEq/L (ref 135–145)
Total Protein: 6.7 g/dL (ref 6.0–8.3)

## 2011-07-19 LAB — CBC WITH DIFFERENTIAL/PLATELET
BASO%: 0.3 % (ref 0.0–2.0)
Basophils Absolute: 0 10*3/uL (ref 0.0–0.1)
EOS%: 0.9 % (ref 0.0–7.0)
HGB: 14 g/dL (ref 11.6–15.9)
MCH: 30.9 pg (ref 25.1–34.0)
MCHC: 33.9 g/dL (ref 31.5–36.0)
RDW: 13.8 % (ref 11.2–14.5)
lymph#: 0.9 10*3/uL (ref 0.9–3.3)

## 2011-07-19 MED ORDER — HYDROCODONE-ACETAMINOPHEN 5-500 MG PO TABS: 1.0000 | ORAL_TABLET | Freq: Four times a day (QID) | ORAL | Status: DC | PRN

## 2011-07-19 MED ORDER — TECHNETIUM TC 99M MEDRONATE IV KIT
23.1000 | PACK | Freq: Once | INTRAVENOUS | Status: AC | PRN
  Administered 2011-07-19: 23.1 via INTRAVENOUS

## 2011-07-20 NOTE — Progress Notes (Signed)
Hematology and Oncology Follow Up Visit  [name] [id number] 1946-03-08 66 y.o. 07/20/2011 1:22 PM   Principle Diagnosis: Encounter Diagnoses  Name Primary?  . Breast cancer metastasized to bone Yes  . DEGENERATIVE JOINT DISEASE   . Rotator cuff arthropathy      Interim History:   This is a 66 year old woman with metachronous primary bilateral breast cancers; please see recent notes for full details. She had an initial multiple node positive ER positive cancer of the right breast diagnosed in December 2006 treated with lumpectomy radiation and chemotherapy. She progressed in bone while on hormonal therapy with tamoxifen in September 2011. She developed a malignant left axillary lymph node with no clear primary in the left breast November 2011. This tumor was ER negative. HER-2 negative. While under evaluation for this lesion she developed a new lesion in the right breast which was ER positive. She underwent bilateral mastectomies 06/28/2010. She was continued on hormonal therapy with monthly Faslodex injections in view of the ER positive component of her tumor with a complete response to this hormone in bone. She underwent radiation to the left chest and axilla subsequent to the mastectomies. 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 She was treated with electron beam radiation.. 3000 cGy in 15 fractions through December 2012.  Overall stable since last visit here in January. She has developed progressive pain in her right shoulder. A bone scan was done in anticipation of today's visit this morning. I have reviewed the images but there is not an official reading yet. There is diffuse activity over her entire spine with no symptoms to correlate with this and I think this may be nonspecific. There is no increased  activity over the right shoulder or in any of her other long bones. CA 27.29 tumor marker is slowly rising but still within the normal range at 37 units normal up to 39. Serum calcium and alkaline phosphatase are normal.  She continues to have significant pain in her feet from a chronic idiopathic orthopedic problem.  Medications: reviewed  Allergies:  Allergies  Allergen Reactions  . Percodan (Oxycodone-Aspirin) Nausea Only  . Codeine Nausea Only    Review of Systems: Constitutional:   Chronic fatigue unchanged Respiratory: No cough or dyspnea Cardiovascular:  No chest pain or palpitations Gastrointestinal: Currently no abdominal symptoms Genito-Urinary: No vaginal bleeding Musculoskeletal: See above Neurologic: No headache or change in vision Skin: No rash Remaining ROS negative.  Physical Exam: Blood pressure 163/87, pulse 68, temperature 97.5 F (36.4 C), temperature source Oral, height 5\' 4"  (1.626 m), weight 178 lb 9.6 oz (81.012 kg). Wt Readings from Last 3 Encounters:  07/19/11 178 lb 9.6 oz (81.012 kg)  05/10/11 176 lb 11.2 oz (80.151 kg)  03/01/11 176 lb 9.6 oz (80.105 kg)     General appearance: Adequately nourished Caucasian woman HENNT: Pharynx no erythema or exudate, Range of motion, Lymph nodes: No cervical supraclavicular or axillary adenopathy Breasts: Bilateral mastectomies. No obvious chest wall lesions at this time Lungs: Clear to auscultation resonant to percussion Heart: Regular rhythm no murmur Abdomen: Soft nontender Extremities: No edema no calf tenderness Vascular: No cyanosis of the hands shoes is not removed Neurologic: Alert and oriented, mental status intact, cranial nerves intact, motor strength 5 over 5, reflexes 1+ symmetric, Skin: No rash or ecchymosis Musculoskeletal: She has point tenderness on  palpation over the posterior aspect of the glenohumeral joint  Lab Results: Lab Results  Component Value Date   WBC 6.4 07/19/2011   HGB  14.0 07/19/2011   HCT 41.2 07/19/2011   MCV 91.2 07/19/2011   PLT 278 07/19/2011     Chemistry      Component Value Date/Time   NA 142 07/19/2011 1203   K 4.2 07/19/2011 1203   CL 105 07/19/2011 1203   CO2 28 07/19/2011 1203   BUN 17 07/19/2011 1203   CREATININE 0.67 07/19/2011 1203      Component Value Date/Time   CALCIUM 9.5 07/19/2011 1203   ALKPHOS 72 07/19/2011 1203   AST 23 07/19/2011 1203   ALT 18 07/19/2011 1203   BILITOT 0.4 07/19/2011 1203    CA 2729 tumor marker 37 units normal up to 39   Radiological Studies: See discussion above   Impression and Plan: #1. Metachronous primary ER positive cancers of the right breast with a third primary ER negative cancer of the left breast with an  occult primary in that breast not found on mastectomy but presenting with a hypermetabolic lymph node on PET scan biopsy proven ER negative adenocarcinoma consistent with a breast primary. Treatment as outlined above. She continues on monthly Faslodex injections for the ER positive breast cancer. The ER negative cancers have been treated with surgery and radiation  #2. Chest wall recurrence October 2012 treated with electron beam radiation.  #3. Progressive right shoulder pain. Based on my interpretation of the bone scan, I don't think this is related to her metastatic breast cancer. I think she may have a rotator cuff arthropathy. I'm going to get a MRI scan. If my impressions are confirmed then she will be referred to orthopedic surgery to consider a steroid injection or. arthroscopic surgery if necessary.  #3. History of extensive superficial phlebitis lower extremity.  In view of metastatic cancer and ongoing hormonal therapy I elected to put her on full dose Coumadin. INR today is stable on Coumadin 4 mg daily .  #4. Essential hypertension   #5. Irritable bowel syndrome   #6. Chronic orthopedic problems with her feet    CC:. Dr. Lynelle Smoke; Antony Blackbird; Cyndia Bent; Ernestina Penna;  Janeece Agee, MD 3/23/20131:22 PM

## 2011-07-22 ENCOUNTER — Telehealth: Payer: Self-pay | Admitting: *Deleted

## 2011-07-22 NOTE — Telephone Encounter (Signed)
per orders from 07-22-2011 gave patient appointment for mri and pet scan on 09-10-2011 arrival time 10:15am patient will see dr.granfortuna on 09-17-2011 at 10:00 labs and 10:30 - 11:00

## 2011-07-25 ENCOUNTER — Other Ambulatory Visit: Payer: Self-pay | Admitting: *Deleted

## 2011-07-25 ENCOUNTER — Telehealth: Payer: Self-pay | Admitting: *Deleted

## 2011-07-25 NOTE — Telephone Encounter (Signed)
Orders faxed to [facility] for faslodex injections for April 4, May 2, June 6, July 5, Aug. 1, Aug. 29/2013 per Dr. Cyndie Chime ==q mo x 6 mo.

## 2011-07-25 NOTE — Telephone Encounter (Signed)
left voice message to inform the patient of the new date and time of the mri of the shoulder on 07-31-2011 arrival time 9:45am

## 2011-07-26 ENCOUNTER — Telehealth: Payer: Self-pay | Admitting: *Deleted

## 2011-07-26 ENCOUNTER — Encounter (HOSPITAL_COMMUNITY): Payer: Self-pay

## 2011-07-26 NOTE — Telephone Encounter (Signed)
left voice message to inform the patient of the injection appointment she asked me about on 07-25-2011 she the patient called me back to confirm her appointment for mri of the shoulder 07-31-2011 per conversation with nurse mrytle she stated that she did fax over orders to dr.mortison office at the [facility] hospital because the patient will be getting her injections at [facility] hospital

## 2011-07-29 ENCOUNTER — Ambulatory Visit (HOSPITAL_COMMUNITY)
Admission: RE | Admit: 2011-07-29 | Discharge: 2011-07-29 | Disposition: A | Discharge status: Home / Self Care | Payer: Medicare PPO | Source: Ambulatory Visit | Attending: Ophthalmology | Admitting: Ophthalmology

## 2011-07-29 ENCOUNTER — Encounter (HOSPITAL_COMMUNITY)
Admission: RE | Disposition: A | Discharge status: Home / Self Care | Payer: Self-pay | Source: Ambulatory Visit | Attending: Ophthalmology

## 2011-07-29 DIAGNOSIS — Z961 Presence of intraocular lens: Secondary | ICD-10-CM | POA: Insufficient documentation

## 2011-07-29 DIAGNOSIS — H5210 Myopia, unspecified eye: Secondary | ICD-10-CM | POA: Insufficient documentation

## 2011-07-29 DIAGNOSIS — H52209 Unspecified astigmatism, unspecified eye: Secondary | ICD-10-CM | POA: Insufficient documentation

## 2011-07-29 DIAGNOSIS — H524 Presbyopia: Secondary | ICD-10-CM | POA: Insufficient documentation

## 2011-07-29 DIAGNOSIS — H26499 Other secondary cataract, unspecified eye: Secondary | ICD-10-CM | POA: Insufficient documentation

## 2011-07-29 SURGERY — TREATMENT, USING YAG LASER
Anesthesia: LOCAL | Laterality: Left

## 2011-07-29 MED ORDER — TROPICAMIDE 1 % OP SOLN
OPHTHALMIC | Status: AC
  Administered 2011-07-29: 1 [drp] via OPHTHALMIC
  Filled 2011-07-29: qty 3

## 2011-07-29 MED ORDER — TETRACAINE HCL 0.5 % OP SOLN
1.0000 [drp] | Freq: Once | OPHTHALMIC | Status: AC
  Administered 2011-07-29: 1 [drp] via OPHTHALMIC

## 2011-07-29 MED ORDER — TROPICAMIDE 1 % OP SOLN
1.0000 [drp] | OPHTHALMIC | Status: AC
Start: 2011-07-29 — End: 2011-07-29
  Administered 2011-07-29 (×3): 1 [drp] via OPHTHALMIC

## 2011-07-29 MED ORDER — TETRACAINE HCL 0.5 % OP SOLN
OPHTHALMIC | Status: AC
  Administered 2011-07-29: 1 [drp] via OPHTHALMIC
  Filled 2011-07-29: qty 2

## 2011-07-29 MED ORDER — APRACLONIDINE HCL 1 % OP SOLN
1.0000 [drp] | OPHTHALMIC | Status: AC
Start: 2011-07-29 — End: 2011-07-29
  Administered 2011-07-29 (×2): 1 [drp] via OPHTHALMIC

## 2011-07-29 MED ORDER — APRACLONIDINE HCL 1 % OP SOLN
OPHTHALMIC | Status: AC
  Administered 2011-07-29: 1 [drp] via OPHTHALMIC
  Filled 2011-07-29: qty 0.1

## 2011-07-29 NOTE — Brief Op Note (Signed)
[name] 07/29/2011  Susa Simmonds, MD  Yag Laser Self Test Completedyes. Procedure: Posterior Capsulotomy, left eye.  Eye Protection Worn by Staff yes. Laser In Use Sign on Door yes.  Laser: Nd:YAG Spot Size: Fixed Burst Mode: III Power Setting: 2.3 mJ/burst Position treated: capsule o'clock position Number of shots: 29 Total energy delivered: 65.7 mJ  Patency of the peripheral iridotomy was confirmed visually.  The patient tolerated the procedure without difficulty. No complications were encountered.  Tenometer reading immediately after procedure: not performed  mmHg.  The patient was discharged home with the instructions to continue all her current glaucoma medications, if any.   Patient instructed to go to office at not required for intraocular pressure check.  Patient verbalizes understanding of discharge instructions yes.   Notes:visual axis clear post procedure, cornea clear

## 2011-07-29 NOTE — H&P (Signed)
See office scanned H&P

## 2011-07-29 NOTE — Discharge Instructions (Signed)
ANTONELA FREIMAN  07/25/2011     Instructions    Activity: No Restrictions.   Diet: Resume Diet you were on at home.   Pain Medication: Tylenol if Needed.   CONTACT YOUR DOCTOR IF YOU HAVE PAIN, REDNESS IN YOUR EYE, OR DECREASED VISION.   Follow-up:08/20/2011 with Susa Simmonds, MD.   Dr. Lahoma Crocker: [phone]  Dr. Lita Mains: [phone]  Dr. Alto Denver: [phone]   If you find that you cannot contact your physician, but feel that your signs and   Symptoms warrant a physician's attention, call the Emergency Room at   [phone] ext.[phone].

## 2011-07-31 ENCOUNTER — Other Ambulatory Visit: Payer: Self-pay | Admitting: Oncology

## 2011-07-31 ENCOUNTER — Ambulatory Visit (HOSPITAL_COMMUNITY)
Admission: RE | Admit: 2011-07-31 | Discharge: 2011-07-31 | Disposition: A | Discharge status: Home / Self Care | Payer: Medicare PPO | Source: Ambulatory Visit | Attending: Oncology | Admitting: Oncology

## 2011-07-31 ENCOUNTER — Telehealth: Payer: Self-pay | Admitting: *Deleted

## 2011-07-31 DIAGNOSIS — C7951 Secondary malignant neoplasm of bone: Secondary | ICD-10-CM

## 2011-07-31 DIAGNOSIS — M19019 Primary osteoarthritis, unspecified shoulder: Secondary | ICD-10-CM | POA: Insufficient documentation

## 2011-07-31 DIAGNOSIS — C50919 Malignant neoplasm of unspecified site of unspecified female breast: Secondary | ICD-10-CM | POA: Insufficient documentation

## 2011-07-31 DIAGNOSIS — M1990 Unspecified osteoarthritis, unspecified site: Secondary | ICD-10-CM

## 2011-07-31 DIAGNOSIS — M659 Unspecified synovitis and tenosynovitis, unspecified site: Secondary | ICD-10-CM | POA: Insufficient documentation

## 2011-07-31 DIAGNOSIS — R252 Cramp and spasm: Secondary | ICD-10-CM | POA: Insufficient documentation

## 2011-07-31 DIAGNOSIS — M12819 Other specific arthropathies, not elsewhere classified, unspecified shoulder: Secondary | ICD-10-CM

## 2011-07-31 DIAGNOSIS — C801 Malignant (primary) neoplasm, unspecified: Secondary | ICD-10-CM | POA: Insufficient documentation

## 2011-07-31 DIAGNOSIS — M67919 Unspecified disorder of synovium and tendon, unspecified shoulder: Secondary | ICD-10-CM | POA: Insufficient documentation

## 2011-07-31 DIAGNOSIS — M25519 Pain in unspecified shoulder: Secondary | ICD-10-CM | POA: Insufficient documentation

## 2011-07-31 DIAGNOSIS — M6740 Ganglion, unspecified site: Secondary | ICD-10-CM | POA: Insufficient documentation

## 2011-07-31 DIAGNOSIS — M6250 Muscle wasting and atrophy, not elsewhere classified, unspecified site: Secondary | ICD-10-CM | POA: Insufficient documentation

## 2011-07-31 DIAGNOSIS — M719 Bursopathy, unspecified: Secondary | ICD-10-CM | POA: Insufficient documentation

## 2011-07-31 NOTE — Telephone Encounter (Signed)
Kendal Hymen called asking if Dr. Cyndie Chime wanted MRI of shoulder with IV contrast or contrast directly into the shoulder.  Discussed with Dr. Cyndie Chime.  He would like them to do whatever is best to diagnose the Rotator Cuff.  Called Kendal Hymen back and let her know that and they will do MRI without contrast.

## 2011-08-01 ENCOUNTER — Telehealth: Payer: Self-pay | Admitting: *Deleted

## 2011-08-01 NOTE — Telephone Encounter (Signed)
patient called in yesterday stating that her injections appointments were not correct the patient will be getting her injections in [facility] per [facility] she will bring this to Mytle attention

## 2011-08-08 ENCOUNTER — Telehealth: Payer: Self-pay | Admitting: *Deleted

## 2011-08-08 NOTE — Telephone Encounter (Signed)
Pt left a vm with ans service yest pm wanting to know what to do re her cancer shot & should she postpone it or get it early b/c surgery is scheduled for 08/27/11.  Per Dr. Cyndie Chime pt should take faslodex a day or 2 before surgery & bigger issue is her coumadin & she should stop the coumadin 4 days before surgery & have surgeon resume coumadin night of surgery if all goes well.  She wrote this down & have Dr. Chaney Malling call Dr. Cyndie Chime if he has questions.

## 2011-08-23 ENCOUNTER — Other Ambulatory Visit: Payer: Self-pay

## 2011-08-23 DIAGNOSIS — C50919 Malignant neoplasm of unspecified site of unspecified female breast: Secondary | ICD-10-CM

## 2011-08-23 MED ORDER — HYDROCODONE-ACETAMINOPHEN 5-500 MG PO TABS: 1.0000 | ORAL_TABLET | Freq: Four times a day (QID) | ORAL | Status: DC | PRN

## 2011-08-23 MED ORDER — PANTOPRAZOLE SODIUM 40 MG PO TBEC: 40.0000 mg | DELAYED_RELEASE_TABLET | ORAL | Status: DC | PRN

## 2011-08-27 HISTORY — PX: ROTATOR CUFF REPAIR: SHX139

## 2011-09-10 ENCOUNTER — Other Ambulatory Visit (HOSPITAL_COMMUNITY): Payer: Medicare PPO

## 2011-09-10 ENCOUNTER — Other Ambulatory Visit: Payer: Self-pay

## 2011-09-12 ENCOUNTER — Telehealth: Payer: Self-pay | Admitting: *Deleted

## 2011-09-12 ENCOUNTER — Telehealth: Payer: Self-pay | Admitting: Oncology

## 2011-09-12 NOTE — Telephone Encounter (Signed)
Pt called stating that she received a call from the radiology tech wanting to know why she hadn't arrived for PET scan on 09/10/11 & he said she was supposed to get labs also.  She was very upset & states that no one called her regarding these appts.  She reports that on her last visit with Dr. Cyndie Chime at the end of the day she was told by one of the schedulers to just lay her board on the other desk & someone would call her later & this never happened.  She states that she couldn't possibly do the PET now due to her shoulder surgery.  She was given appt date & time for 09/17/11 for lab & MD but will discuss with Dr Cyndie Chime if this needs to be moved since PET not done.  She also reports that she needs a PT/INR done which has not been repeated since restarting after surgery.   Message left with Melissa/Scheduler regarding pt's concerns over appts.

## 2011-09-12 NOTE — Telephone Encounter (Signed)
Received message from [facility] re pt concerns of not receiving pet/fu appts. There is documentation from Jacki Cones in march re a message left for pt about appts. Pt states she did not received the message. I extended my apologies and as pt has some familiarity w/me she was encourage to call me at any time re any scheduling problems, concerns or appts in general and I will take care of whatever she needs. Per myrtle's note she is awaiting info re need for 5/21 appt since pt has not had pet scan. pt also has concerns re doing pet scan right now since her recent rotator cuff surgery. Pt made aware that as of right f/u appt has not changed and we will await instruction from [facility]. Pt aware I will not be in on tomorrow but once I hear from [facility] I will handle her appts upon return 5/20. Also lm for [facility] informing her that I have spoken w/pt and asked that she give instruction to me personally re how to proceed w/pt's scheduling or rescheduling. Myrtle also informed via vm that I will not be in tomorrow (5/17). Pt was pleasant and receptive to my apologies and taking care of the above matter.

## 2011-09-13 ENCOUNTER — Telehealth: Payer: Self-pay

## 2011-09-13 NOTE — Telephone Encounter (Signed)
Spoke with pt re: Myrtle's note from yesterday.    Per Dr Cyndie Chime - it's pt's choice if she wants to keep appts for Tuesday, 5/21 or r/s.   Pt reports she is seeing ortho in f/u from her rotator cuff repair on Tuesday, so she will cancel her appts here and call to r/s after ortho releases her to drive.  At that time, she will r/s PET and MD appts.   Pt aware to contact PCP for INR check.    Note routed to [facility], scheduler.  dph

## 2011-09-17 ENCOUNTER — Ambulatory Visit: Payer: Medicare PPO | Admitting: Oncology

## 2011-09-17 ENCOUNTER — Other Ambulatory Visit: Payer: Medicare PPO | Admitting: Lab

## 2011-09-17 ENCOUNTER — Encounter: Payer: Self-pay | Admitting: Oncology

## 2011-09-17 NOTE — Progress Notes (Signed)
Mrs. family decided to reschedule her appointment today. At time of most recent visit here she was having significant pain and mobility problems with her right shoulder. I referred her to an orthopedic surgeon in [facility]. MRI showed a rotator cuff tear. She underwent surgery and is still recuperating. She will reschedule her M.D. visit and PET scan after she recovers from the surgery.

## 2011-09-18 ENCOUNTER — Telehealth: Payer: Self-pay | Admitting: *Deleted

## 2011-09-18 NOTE — Telephone Encounter (Signed)
Received vm call from pt stating that she saw her ortho Dr. Marland Kitchen she can't drive for another week.  She would like to r/s her scan & MD appt for 2 wks before she goes back to work.  Message given to scheduler/Melissa.

## 2011-09-19 ENCOUNTER — Other Ambulatory Visit: Payer: Self-pay | Admitting: Oncology

## 2011-09-20 ENCOUNTER — Telehealth: Payer: Self-pay | Admitting: Oncology

## 2011-09-20 ENCOUNTER — Other Ambulatory Visit: Payer: Self-pay | Admitting: *Deleted

## 2011-09-20 DIAGNOSIS — C50919 Malignant neoplasm of unspecified site of unspecified female breast: Secondary | ICD-10-CM

## 2011-09-20 NOTE — Telephone Encounter (Signed)
S/w pt today confirming appt for 5/28 @ 2pm lb/JG. D/t per JG (myrtle). Pt aware pet will be after f/u and pt is aware. Need new order for pet - myrtle aware - awaiting new order.

## 2011-09-24 ENCOUNTER — Other Ambulatory Visit (HOSPITAL_BASED_OUTPATIENT_CLINIC_OR_DEPARTMENT_OTHER): Payer: Medicare PPO | Admitting: Lab

## 2011-09-24 ENCOUNTER — Ambulatory Visit (HOSPITAL_BASED_OUTPATIENT_CLINIC_OR_DEPARTMENT_OTHER): Payer: Medicare PPO | Admitting: Oncology

## 2011-09-24 ENCOUNTER — Telehealth: Payer: Self-pay | Admitting: Oncology

## 2011-09-24 ENCOUNTER — Encounter: Payer: Medicare PPO | Admitting: Hematology and Oncology

## 2011-09-24 VITALS — BP 142/86 | HR 69 | Temp 97.4°F | Ht 64.0 in | Wt 175.1 lb

## 2011-09-24 DIAGNOSIS — C7951 Secondary malignant neoplasm of bone: Secondary | ICD-10-CM

## 2011-09-24 DIAGNOSIS — Z8672 Personal history of thrombophlebitis: Secondary | ICD-10-CM

## 2011-09-24 DIAGNOSIS — M75100 Unspecified rotator cuff tear or rupture of unspecified shoulder, not specified as traumatic: Secondary | ICD-10-CM

## 2011-09-24 DIAGNOSIS — C50919 Malignant neoplasm of unspecified site of unspecified female breast: Secondary | ICD-10-CM

## 2011-09-24 DIAGNOSIS — Z7901 Long term (current) use of anticoagulants: Secondary | ICD-10-CM

## 2011-09-24 LAB — CANCER ANTIGEN 27.29: CA 27.29: 38 U/mL (ref 0–39)

## 2011-09-24 LAB — CBC WITH DIFFERENTIAL/PLATELET
Basophils Absolute: 0.1 10*3/uL (ref 0.0–0.1)
EOS%: 2.6 % (ref 0.0–7.0)
HCT: 42.3 % (ref 34.8–46.6)
HGB: 14.1 g/dL (ref 11.6–15.9)
LYMPH%: 12 % — ABNORMAL LOW (ref 14.0–49.7)
MCH: 29.7 pg (ref 25.1–34.0)
MCV: 89.3 fL (ref 79.5–101.0)
MONO%: 9.3 % (ref 0.0–14.0)
NEUT%: 75.1 % (ref 38.4–76.8)
RDW: 13.9 % (ref 11.2–14.5)

## 2011-09-24 LAB — COMPREHENSIVE METABOLIC PANEL
Alkaline Phosphatase: 78 U/L (ref 39–117)
BUN: 17 mg/dL (ref 6–23)
Creatinine, Ser: 0.58 mg/dL (ref 0.50–1.10)
Glucose, Bld: 90 mg/dL (ref 70–99)
Total Bilirubin: 0.4 mg/dL (ref 0.3–1.2)

## 2011-09-24 NOTE — Progress Notes (Signed)
Hematology and Oncology Follow Up Visit  [name] [id number] Jul 13, 1945 66 y.o. 09/24/2011 5:52 PM   Principle Diagnosis: Encounter Diagnoses  Name Primary?  . Breast cancer metastasized to bone Yes  . Rotator cuff tear   . Personal history of thrombophlebitis      Interim History:  Followup visit for this 66 year old woman with a complicated history of metachronous primary bilateral breast cancers. At time of her most recent visit with me on 07/19/2011, she complained of progressive right shoulder pain. Examination revealed pain on motion at the right shoulder joint. A bone scan did not show any focal uptake to suggest metastatic disease and I felt that this was likely an orthopedic problem. I ordered an MRI of the right shoulder done on April 3 which in fact showed severe rotator cuff injury with a full thickness tear at the anterior insertion of the supraspinatus muscle, severe tendinopathy of the supraspinatus and infraspinatus muscle with a tear in the infraspinatus, severe and subscapularis tendinopathy, and a superior subscapularis recess ganglion. I referred her to Dr. Joellyn Quails who now has an office in [facility] which is closer to the patient's home. She underwent surgery on April 30. She is still having a significant amount of pain.  Medications: reviewed  Allergies:  Allergies  Allergen Reactions  . Percodan (Oxycodone-Aspirin) Nausea Only  . Codeine Nausea Only    Review of Systems: Constitutional:   Surgery related pain. Respiratory: No cough or dyspnea Cardiovascular: No chest pain or palpitations  Gastrointestinal: She has developed some anorexia. Food doesn't appeal to her and she has not been eating much. If she does eat she gets postprandial diarrhea. Genito-Urinary: No urinary tract symptoms Musculoskeletal: See above Neurologic: No neurologic symptoms Skin: No rash or ecchymosis. She remains on Coumadin. Remaining ROS  negative.  Physical Exam: Blood pressure 142/86, pulse 69, temperature 97.4 F (36.3 C), temperature source Oral, height 5\' 4"  (1.626 m), weight 175 lb 1.6 oz (79.425 kg). Wt Readings from Last 3 Encounters:  09/24/11 175 lb 1.6 oz (79.425 kg)  07/19/11 178 lb 9.6 oz (81.012 kg)  05/10/11 176 lb 11.2 oz (80.151 kg)     General appearance: Thin Caucasian woman HENNT: Pharynx no erythema or exudate Lymph nodes: No lymphadenopathy Breasts: Bilateral mastectomies. Currently no skin lesions. Lungs: Clear to auscultation resonant to percussion Heart: Regular rhythm no murmur Abdomen: Soft nontender Extremities: No edema no calf tenderness Vascular: No cyanosis Neurologic: Mental status intact, cranial nerves intact, motor strength 5 over 5, reflexes 1+ symmetric. Skin: No rash or ecchymoses  Lab Results: Lab Results  Component Value Date   WBC 7.2 09/24/2011   HGB 14.1 09/24/2011   HCT 42.3 09/24/2011   MCV 89.3 09/24/2011   PLT 328 09/24/2011     Chemistry      Component Value Date/Time   NA 142 07/19/2011 1203   K 4.2 07/19/2011 1203   CL 105 07/19/2011 1203   CO2 28 07/19/2011 1203   BUN 17 07/19/2011 1203   CREATININE 0.67 07/19/2011 1203      Component Value Date/Time   CALCIUM 9.5 07/19/2011 1203   ALKPHOS 72 07/19/2011 1203   AST 23 07/19/2011 1203   ALT 18 07/19/2011 1203   BILITOT 0.4 07/19/2011 1203       Impression and Plan: #1. Metachronous primary bilateral breast cancers metastatic to bone. Currently receiving monthly Faslodex hormonal injections which has been successful in controlling the bone lesions for the recurrent ER positive lesion  arising from her right breast. Initial multiple node positive ER positive cancer right breast in December 2006 treated with lumpectomy, radiation, and chemotherapy. Progression to bone while on hormonal therapy with tamoxifen September 2011. Development of malignant left axillary lymphadenopathy with no clear primary in the left  breast even in the mastectomy specimen,  November 2011. Lymph node excised. ER negative. HER-2 negative. While under evaluation for this lesion, a new lesion develop in the right breast which was ER positive. She is status post bilateral mastectomies 06/28/2010. She was given radiation to the left chest and axilla. Cutaneous recurrence on the right chest wall October 2012. Treated with electron beam radiation 3000 cGy 15 fractions through December 2012. Plan: I'm going to get a followup PET scan at this time. Continue the Faslodex pending this evaluation.  #2. History of extensive superficial phlebitis of her lower extremity. She remains on full dose Coumadin anticoagulation in view of need for hormonal therapy for her metastatic breast cancer. INR today is 2.2 on 5 mg daily.  #3. Irritable bowel syndrome. Symptoms are currently flaring again.  #4. Recent right rotator cuff repair.  #5. Chronic orthopedic problems with her feet causing chronic pain.  #6. Essential hypertension.   CC:. Dr. Lynelle Smoke; Dr. Cyndia Bent; Dr. Tarri Abernethy; Dr. Ernestina Penna; Dr. Gaetana Michaelis   Levert Feinstein, MD 5/28/20135:52 PM

## 2011-09-24 NOTE — Telephone Encounter (Signed)
Pt's pet scan appt made today while pt was here for f/u. appts made by AM.

## 2011-09-24 NOTE — Telephone Encounter (Signed)
appts made and printed for pt aom °

## 2011-09-25 DIAGNOSIS — Z5111 Encounter for antineoplastic chemotherapy: Secondary | ICD-10-CM

## 2011-09-25 DIAGNOSIS — C50919 Malignant neoplasm of unspecified site of unspecified female breast: Secondary | ICD-10-CM

## 2011-09-26 ENCOUNTER — Telehealth: Payer: Self-pay

## 2011-09-26 NOTE — Telephone Encounter (Signed)
Message copied by Albertha Ghee on Thu Sep 26, 2011  4:50 PM ------      Message from: Levert Feinstein      Created: Wed Sep 25, 2011  2:35 PM       Call pt chem profile normal; ca 27.29 still upper normal at 38   Was 37 normal up to 39

## 2011-09-26 NOTE — Telephone Encounter (Signed)
Results given to [name] per Dr Patsy Lager note.  Results mailed to [name]'s home per her request. dph

## 2011-09-27 ENCOUNTER — Other Ambulatory Visit: Payer: Self-pay

## 2011-09-27 DIAGNOSIS — C50919 Malignant neoplasm of unspecified site of unspecified female breast: Secondary | ICD-10-CM

## 2011-09-27 MED ORDER — HYDROCODONE-ACETAMINOPHEN 5-500 MG PO TABS: 1.0000 | ORAL_TABLET | Freq: Four times a day (QID) | ORAL | Status: DC | PRN

## 2011-10-02 ENCOUNTER — Encounter (HOSPITAL_COMMUNITY)
Admission: RE | Admit: 2011-10-02 | Discharge: 2011-10-02 | Disposition: A | Discharge status: Home / Self Care | Payer: Medicare PPO | Source: Ambulatory Visit | Attending: Oncology | Admitting: Oncology

## 2011-10-02 ENCOUNTER — Encounter (HOSPITAL_COMMUNITY): Payer: Self-pay

## 2011-10-02 DIAGNOSIS — C50919 Malignant neoplasm of unspecified site of unspecified female breast: Secondary | ICD-10-CM | POA: Insufficient documentation

## 2011-10-02 DIAGNOSIS — J984 Other disorders of lung: Secondary | ICD-10-CM | POA: Insufficient documentation

## 2011-10-02 DIAGNOSIS — E279 Disorder of adrenal gland, unspecified: Secondary | ICD-10-CM | POA: Insufficient documentation

## 2011-10-02 DIAGNOSIS — Z9010 Acquired absence of unspecified breast and nipple: Secondary | ICD-10-CM | POA: Insufficient documentation

## 2011-10-02 MED ORDER — FLUDEOXYGLUCOSE F - 18 (FDG) INJECTION
16.3000 | Freq: Once | INTRAVENOUS | Status: AC | PRN
  Administered 2011-10-02: 16.3 via INTRAVENOUS

## 2011-10-03 ENCOUNTER — Encounter: Payer: Self-pay | Admitting: Dietician

## 2011-10-03 NOTE — Progress Notes (Signed)
Brief Out-Patient Oncology Nutrition Note  Reason: Positive nutrition risk screen for unintentional weight loss and decreased appetite.   [name] is a 66 year old patient of Dr. Cyndie Chime, diagnosed with metastatic breast cancer. Attempted to contact patient via telephone for nutrition risk. Patient without a voice mail option to leave RD contact information.   Wt Readings from Last 10 Encounters:  09/24/11 175 lb 1.6 oz (79.425 kg)  07/19/11 178 lb 9.6 oz (81.012 kg)  05/10/11 176 lb 11.2 oz (80.151 kg)  03/01/11 176 lb 9.6 oz (80.105 kg)  12/08/09 179 lb (81.194 kg)  12/27/08 191 lb (86.637 kg)  *Patient with 3 pound weight loss over 2 months, 1.6% from baseline.   RD available for nutrition needs.   [name] [phone]

## 2011-10-22 ENCOUNTER — Encounter: Payer: Medicare PPO | Admitting: Hematology and Oncology

## 2011-10-22 DIAGNOSIS — C50919 Malignant neoplasm of unspecified site of unspecified female breast: Secondary | ICD-10-CM

## 2011-10-22 DIAGNOSIS — Z5111 Encounter for antineoplastic chemotherapy: Secondary | ICD-10-CM

## 2011-10-23 ENCOUNTER — Other Ambulatory Visit: Payer: Self-pay | Admitting: Oncology

## 2011-10-23 DIAGNOSIS — C7952 Secondary malignant neoplasm of bone marrow: Secondary | ICD-10-CM

## 2011-10-23 DIAGNOSIS — C50919 Malignant neoplasm of unspecified site of unspecified female breast: Secondary | ICD-10-CM

## 2011-11-12 ENCOUNTER — Telehealth: Payer: Self-pay | Admitting: Oncology

## 2011-11-12 NOTE — Telephone Encounter (Signed)
Moved 8/27 appt to 8/26 due to call day. D/t per Ottis Stain. S/w pt today re new d/t for 8/26 @ 10 am.

## 2011-11-19 ENCOUNTER — Encounter: Payer: Medicare PPO | Admitting: Hematology and Oncology

## 2011-11-19 DIAGNOSIS — C50919 Malignant neoplasm of unspecified site of unspecified female breast: Secondary | ICD-10-CM

## 2011-11-19 DIAGNOSIS — Z5111 Encounter for antineoplastic chemotherapy: Secondary | ICD-10-CM

## 2011-11-19 DIAGNOSIS — C801 Malignant (primary) neoplasm, unspecified: Secondary | ICD-10-CM

## 2011-11-22 ENCOUNTER — Other Ambulatory Visit: Payer: Self-pay | Admitting: *Deleted

## 2011-11-22 DIAGNOSIS — C7951 Secondary malignant neoplasm of bone: Secondary | ICD-10-CM

## 2011-11-22 DIAGNOSIS — C50919 Malignant neoplasm of unspecified site of unspecified female breast: Secondary | ICD-10-CM

## 2011-11-22 MED ORDER — HYDROCODONE-ACETAMINOPHEN 5-500 MG PO TABS: ORAL_TABLET | ORAL | Status: DC

## 2011-11-22 NOTE — Telephone Encounter (Signed)
Pt. Called and requested refill of vicodin be called in to [facility].  Called patient and let her know script was called in.

## 2011-12-17 DIAGNOSIS — Z5111 Encounter for antineoplastic chemotherapy: Secondary | ICD-10-CM

## 2011-12-17 DIAGNOSIS — C50919 Malignant neoplasm of unspecified site of unspecified female breast: Secondary | ICD-10-CM

## 2011-12-23 ENCOUNTER — Other Ambulatory Visit (HOSPITAL_BASED_OUTPATIENT_CLINIC_OR_DEPARTMENT_OTHER): Payer: Medicare PPO | Admitting: Lab

## 2011-12-23 ENCOUNTER — Telehealth: Payer: Self-pay | Admitting: Oncology

## 2011-12-23 ENCOUNTER — Ambulatory Visit (HOSPITAL_BASED_OUTPATIENT_CLINIC_OR_DEPARTMENT_OTHER): Payer: Medicare PPO | Admitting: Oncology

## 2011-12-23 VITALS — BP 152/86 | HR 73 | Temp 98.2°F | Resp 20 | Ht 64.0 in | Wt 177.0 lb

## 2011-12-23 DIAGNOSIS — M25559 Pain in unspecified hip: Secondary | ICD-10-CM

## 2011-12-23 DIAGNOSIS — C50919 Malignant neoplasm of unspecified site of unspecified female breast: Secondary | ICD-10-CM

## 2011-12-23 DIAGNOSIS — C44599 Other specified malignant neoplasm of skin of other part of trunk: Secondary | ICD-10-CM

## 2011-12-23 DIAGNOSIS — C7951 Secondary malignant neoplasm of bone: Secondary | ICD-10-CM

## 2011-12-23 DIAGNOSIS — Z8672 Personal history of thrombophlebitis: Secondary | ICD-10-CM

## 2011-12-23 DIAGNOSIS — C801 Malignant (primary) neoplasm, unspecified: Secondary | ICD-10-CM

## 2011-12-23 LAB — CBC WITH DIFFERENTIAL/PLATELET
BASO%: 0.8 % (ref 0.0–2.0)
Eosinophils Absolute: 0.2 10*3/uL (ref 0.0–0.5)
HCT: 42.3 % (ref 34.8–46.6)
LYMPH%: 9.2 % — ABNORMAL LOW (ref 14.0–49.7)
MONO#: 0.5 10*3/uL (ref 0.1–0.9)
NEUT#: 5.7 10*3/uL (ref 1.5–6.5)
Platelets: 287 10*3/uL (ref 145–400)
RBC: 4.7 10*6/uL (ref 3.70–5.45)
WBC: 7 10*3/uL (ref 3.9–10.3)
lymph#: 0.6 10*3/uL — ABNORMAL LOW (ref 0.9–3.3)

## 2011-12-23 LAB — COMPREHENSIVE METABOLIC PANEL (CC13)
ALT: 18 U/L (ref 0–55)
AST: 20 U/L (ref 5–34)
Albumin: 3.8 g/dL (ref 3.5–5.0)
CO2: 28 mEq/L (ref 22–29)
Calcium: 9.3 mg/dL (ref 8.4–10.4)
Chloride: 106 mEq/L (ref 98–107)
Creatinine: 0.7 mg/dL (ref 0.6–1.1)
Potassium: 4.1 mEq/L (ref 3.5–5.1)
Total Protein: 6.7 g/dL (ref 6.4–8.3)

## 2011-12-23 LAB — LACTATE DEHYDROGENASE (CC13): LDH: 201 U/L (ref 125–220)

## 2011-12-23 LAB — CANCER ANTIGEN 27.29: CA 27.29: 56 U/mL — ABNORMAL HIGH (ref 0–39)

## 2011-12-23 NOTE — Telephone Encounter (Signed)
Gave patient appt calendar for September 2013 and faxed order to [facility] for Faslodex injection, pt given appt for MRI

## 2011-12-23 NOTE — Progress Notes (Signed)
Hematology and Oncology Follow Up Visit  [name] [id number] Mar 14, 1946 66 y.o. 12/23/2011 7:01 PM   Principle Diagnosis: Encounter Diagnosis  Name Primary?  . 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 She is currently on single agent monthly Faslodex hormonal injections. Most recent restaging PET scan done June 5 showed stable disease. There was no hypermetabolic activity in her bones on that study. CA 2729 tumor marker which has been a reliable marker for her disease activity fell from a peak value of 156 back in September 2010 to a nadir value of 23 by March of 2012. It has been slowly rising but still remaining within the reference range of up to 39 through most recent value of May 28 of 38 units. She reports increasing low back pain and right hip pain requiring more frequent pain medication. She is using to 5/500 Vicodin 4 times daily and this is not sufficient to control her pain. She is also having pain in the soft tissues of her left sole of foot. She has chronic orthopedic problems which also complicates interpretation. (Recent right rotator cuff tear requiring surgery and chronic ankle and foot pain).  she denies any headache or change in vision, no vaginal bleeding.  Medications: reviewed  Allergies:  Allergies  Allergen Reactions  . Percodan (Oxycodone-Aspirin) Nausea Only  . Codeine Nausea Only    Review of Systems: Constitutional:   No constitutional symptoms Respiratory: No cough or dyspnea Cardiovascular: No chest pain, pressure, or palpitations  Gastrointestinal: No abdominal pain no change in bowel habit Genito-Urinary: See above Musculoskeletal: See above Neurologic: No headache or change in vision Skin: No rash Remaining ROS negative.  Physical Exam: Blood pressure 152/86, pulse 73, temperature  98.2 F (36.8 C), temperature source Oral, resp. rate 20, height 5\' 4"  (1.626 m), weight 177 lb (80.287 kg). Wt Readings from Last 3 Encounters:  12/23/11 177 lb (80.287 kg)  09/24/11 175 lb 1.6 oz (79.425 kg)  07/19/11 178 lb 9.6 oz (81.012 kg)     General appearance: Well-nourished Caucasian woman HENNT: Pharynx no erythema or exudate Lymph nodes: No lymphadenopathy Breasts: Bilateral mastectomies. Currently no chest wall lesions or nodularity. Small area of bluish discoloration lateral aspect right breast scar but no gross evidence for ischemia or skin necrosis. Lungs: Clear to auscultation resonant to percussion Heart: Regular rhythm no murmur Abdomen: Soft nontender no mass organomegaly Extremities: 1+ symmetric ankle edema no calf tenderness. She is tender on palpation of the soft tissues of the left foot Vascular: No cyanosis Neurologic: Motor strength 5 over 5 reflexes 1+ symmetric Skin: No rash or ecchymosis  Lab Results: Lab Results  Component Value Date   WBC 7.0 12/23/2011   HGB 14.3 12/23/2011   HCT 42.3 12/23/2011   MCV 90.0 12/23/2011   PLT 287 12/23/2011     Chemistry      Component Value Date/Time   NA 143 12/23/2011 1004   NA 141 09/24/2011 1420   K 4.1 12/23/2011 1004   K 4.0 09/24/2011 1420   CL 106 12/23/2011 1004   CL 104 09/24/2011 1420   CO2 28 12/23/2011 1004   CO2 26 09/24/2011 1420   BUN 12.0 12/23/2011 1004   BUN 17 09/24/2011 1420   CREATININE 0.7 12/23/2011 1004   CREATININE 0.58 09/24/2011 1420      Component Value Date/Time  CALCIUM 9.3 12/23/2011 1004   CALCIUM 9.4 09/24/2011 1420   ALKPHOS 74 12/23/2011 1004   ALKPHOS 78 09/24/2011 1420   AST 20 12/23/2011 1004   AST 18 09/24/2011 1420   ALT 18 12/23/2011 1004   ALT 16 09/24/2011 1420   BILITOT 0.60 12/23/2011 1004   BILITOT 0.4 09/24/2011 1420    CA 2729 tumor marker now elevated at 56 units   Radiological Studies: No results found.  Impression and Plan: Clinical and chemical evidence for  further progression of known metastatic ER positive and ER negative HER-2 negative bilateral breast cancers.  Initial 7 node positive strongly ER/PR positive cancer of the right breast diagnosed in December 2006 treated with lumpectomy radiation and chemotherapy with 4 cycles of AC. followed by weekly Taxotere x6 weeks on 2 weeks off for 14 weeks.. She did not tolerate either Arimidex or Aromasin due to gastritis and tachycardia. She was started on tamoxifen in January 2008. She progressed in bone while on hormonal therapy with tamoxifen in September 2011. She developed a malignant left axillary lymph node with no clear primary in the left breast November 2011. This tumor was ER negative. HER-2 negative. While under evaluation for this lesion she developed a new lesion in the right breast which was ER positive. She underwent bilateral mastectomies 06/28/2010. She was continued on hormonal therapy with monthly Faslodex injections in view of the ER positive component of her tumor with a complete response to this hormone in bone. She underwent radiation to the left chest and axilla subsequent to the mastectomies. 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 She was treated with electron beam radiation.. 3000 cGy in 15 fractions through December 2012.  She is relatively chemotherapy nave and has not had any chemotherapy now for 6 years. We have exhausted all reasonable hormonal therapy.  Plan: I'm going to get a MRI scan of her low back and right hip to confirm my clinical suspicion of progression. I will schedule a short interim followup visit to discuss results and to discuss a chemotherapy program. I will likely use single agent Abraxane or single agent Navelbine. I am starting her on Duragesic 25 mcg every 3 days. I also gave her  a prescription for hydrocodone 5 mg to use for breakthrough pain and encouraged her to continue the 2 Vicodin 5/504 times daily.  #2. History of extensive superficial phlebitis of her lower extremity. She remains on full dose Coumadin anticoagulation in view of need for hormonal therapy for her metastatic breast cancer. INR today is 2.2 on 5 mg daily.  #3. Irritable bowel syndrome.  Symptoms are currently flaring again.  #4. Recent right rotator cuff repair.  #5. Chronic orthopedic problems with her feet causing chronic pain.  #6. Essential hypertension.    CC:. Dr. Quintin Alto; Dr. Cyndia Bent; Dr. Antony Blackbird; Dr. Arline Asp; Dr. Ernestina Penna   Levert Feinstein, MD 8/26/20137:01 PM

## 2011-12-24 ENCOUNTER — Ambulatory Visit: Payer: Medicare PPO | Admitting: Oncology

## 2011-12-24 ENCOUNTER — Other Ambulatory Visit: Payer: Medicare PPO | Admitting: Lab

## 2011-12-25 ENCOUNTER — Other Ambulatory Visit: Payer: Self-pay | Admitting: *Deleted

## 2011-12-25 ENCOUNTER — Telehealth: Payer: Self-pay | Admitting: *Deleted

## 2011-12-25 NOTE — Telephone Encounter (Signed)
Received call from pt's pharmacy/[facility] stating that pt brought prescription for plain hydrocodone 5 mg & it was filled for her usual vicodin 5/500.  Pt called back & states that Dr. Cyndie Chime didn't want her to have as much tylenol due to her liver.  The pharmacist states there is not a plain hydrocodone & suggest Norco 10/325.  Note to Dr. Cyndie Chime to clarify.

## 2011-12-26 ENCOUNTER — Other Ambulatory Visit: Payer: Self-pay | Admitting: *Deleted

## 2011-12-26 DIAGNOSIS — C7951 Secondary malignant neoplasm of bone: Secondary | ICD-10-CM

## 2011-12-26 DIAGNOSIS — C50919 Malignant neoplasm of unspecified site of unspecified female breast: Secondary | ICD-10-CM

## 2011-12-26 DIAGNOSIS — R52 Pain, unspecified: Secondary | ICD-10-CM

## 2011-12-26 MED ORDER — FENTANYL 25 MCG/HR TD PT72: 1.0000 | MEDICATED_PATCH | TRANSDERMAL | Status: AC

## 2011-12-26 MED ORDER — HYDROCODONE-ACETAMINOPHEN 5-500 MG PO TABS: ORAL_TABLET | ORAL | Status: DC

## 2011-12-26 MED ORDER — HYDROCODONE-ACETAMINOPHEN 10-325 MG PO TABS: 1.0000 | ORAL_TABLET | ORAL | Status: AC | PRN

## 2011-12-27 ENCOUNTER — Telehealth: Payer: Self-pay | Admitting: Oncology

## 2011-12-27 NOTE — Telephone Encounter (Signed)
S/w pt re appt for bone scan @ [facility] 9/6 @ 10:30 am to arrive 10 am. Also confirmed w/pt 9/3 mri @ [facility] and 10/7 lb/fu. Bone scan could not be added to mri and was schedule @ [facility] per 8/26 pof if not able to add. Also pt was already on schedule for 10/7 so no other f/u added.

## 2011-12-27 NOTE — Telephone Encounter (Signed)
Add to previous note.......... EPIC order faxed to [facility].

## 2011-12-31 ENCOUNTER — Ambulatory Visit (HOSPITAL_COMMUNITY)
Admission: RE | Admit: 2011-12-31 | Discharge: 2011-12-31 | Disposition: A | Discharge status: Home / Self Care | Payer: Medicare PPO | Source: Ambulatory Visit | Attending: Oncology | Admitting: Oncology

## 2011-12-31 DIAGNOSIS — M545 Low back pain, unspecified: Secondary | ICD-10-CM | POA: Insufficient documentation

## 2011-12-31 DIAGNOSIS — M25559 Pain in unspecified hip: Secondary | ICD-10-CM | POA: Insufficient documentation

## 2011-12-31 DIAGNOSIS — C7951 Secondary malignant neoplasm of bone: Secondary | ICD-10-CM

## 2011-12-31 DIAGNOSIS — C50919 Malignant neoplasm of unspecified site of unspecified female breast: Secondary | ICD-10-CM | POA: Insufficient documentation

## 2011-12-31 DIAGNOSIS — K5730 Diverticulosis of large intestine without perforation or abscess without bleeding: Secondary | ICD-10-CM | POA: Insufficient documentation

## 2011-12-31 DIAGNOSIS — M76899 Other specified enthesopathies of unspecified lower limb, excluding foot: Secondary | ICD-10-CM | POA: Insufficient documentation

## 2011-12-31 DIAGNOSIS — M6250 Muscle wasting and atrophy, not elsewhere classified, unspecified site: Secondary | ICD-10-CM | POA: Insufficient documentation

## 2011-12-31 DIAGNOSIS — C7952 Secondary malignant neoplasm of bone marrow: Secondary | ICD-10-CM | POA: Insufficient documentation

## 2012-01-02 ENCOUNTER — Encounter: Payer: Self-pay | Admitting: Oncology

## 2012-01-02 NOTE — Progress Notes (Signed)
Berkley Harvey #[id number] for bone scan @ [facility].

## 2012-01-03 ENCOUNTER — Telehealth: Payer: Self-pay | Admitting: *Deleted

## 2012-01-03 NOTE — Telephone Encounter (Signed)
Message left for [place] on vm that Dr.Granfortuna is stopping faslodex.

## 2012-01-07 ENCOUNTER — Telehealth: Payer: Self-pay | Admitting: *Deleted

## 2012-01-07 NOTE — Telephone Encounter (Signed)
Called patient and let her know that Dr. Cyndie Chime had not received bone scan results from [facility] yet.  Scan done 01/03/12. And will be mailed to [place].

## 2012-01-15 DIAGNOSIS — IMO0001 Reserved for inherently not codable concepts without codable children: Secondary | ICD-10-CM

## 2012-01-15 HISTORY — DX: Reserved for inherently not codable concepts without codable children: IMO0001

## 2012-02-03 ENCOUNTER — Encounter: Payer: Self-pay | Admitting: Oncology

## 2012-02-03 ENCOUNTER — Ambulatory Visit
Admission: RE | Admit: 2012-02-03 | Discharge: 2012-02-03 | Disposition: A | Discharge status: Home / Self Care | Payer: Medicare PPO | Source: Ambulatory Visit | Attending: Radiation Oncology | Admitting: Radiation Oncology

## 2012-02-03 ENCOUNTER — Other Ambulatory Visit (HOSPITAL_BASED_OUTPATIENT_CLINIC_OR_DEPARTMENT_OTHER): Payer: Medicare PPO | Admitting: Lab

## 2012-02-03 ENCOUNTER — Ambulatory Visit (HOSPITAL_BASED_OUTPATIENT_CLINIC_OR_DEPARTMENT_OTHER): Payer: Medicare PPO | Admitting: Oncology

## 2012-02-03 ENCOUNTER — Encounter: Payer: Self-pay | Admitting: Radiation Oncology

## 2012-02-03 VITALS — BP 120/78 | HR 62 | Temp 98.0°F | Resp 20 | Ht 64.0 in | Wt 177.1 lb

## 2012-02-03 VITALS — BP 133/78 | HR 67 | Temp 98.1°F | Resp 18 | Wt 177.6 lb

## 2012-02-03 DIAGNOSIS — C50919 Malignant neoplasm of unspecified site of unspecified female breast: Secondary | ICD-10-CM

## 2012-02-03 DIAGNOSIS — C7951 Secondary malignant neoplasm of bone: Secondary | ICD-10-CM

## 2012-02-03 DIAGNOSIS — Z7901 Long term (current) use of anticoagulants: Secondary | ICD-10-CM

## 2012-02-03 DIAGNOSIS — Z8672 Personal history of thrombophlebitis: Secondary | ICD-10-CM

## 2012-02-03 DIAGNOSIS — C7952 Secondary malignant neoplasm of bone marrow: Secondary | ICD-10-CM

## 2012-02-03 NOTE — Progress Notes (Signed)
Hematology and Oncology Follow Up Visit  [name] [id number] March 10, 1946 66 y.o. 02/03/2012 8:06 PM   Principle Diagnosis: Encounter Diagnoses  Name Primary?  . Breast cancer metastasized to bone Yes  . Personal history of thrombophlebitis   . Chronic anticoagulation      Interim History:   Short interval followup visit for this 65 year old woman with breast cancer metastatic to bone. This occurred while she was on tamoxifen in September 2011. She had an excellent and durable response to monthly Faslodex injections until recently when she complained of increasing low back and right hip pain at time of on August 26 visit here. CA 27.29 tumor marker rose to 56 units. MRI of her lumbosacral spine and hips done on September 3 showed multiple areas of metastatic disease largest at L5 which almost completely replaces the L5 vertebral body. Additional lesions in the sacrum, iliac wings, and right hip. A small 0.8 cm lesion in the right femoral neck with no signs of impending fracture. Additional findings included bilateral trochanteric bursitis much worse on the right and tearing of the gluteus minimus muscle. No soft tissue mass. I referred her back to radiation oncology. She just completed a course of radiation to the lumbosacral spine. Dr. Roselind Messier elected not to treat the right hip since he felt most of the symptoms were related to bursitis and myositis and not to be cancer. I did a bone scan to see if there were any other lesions outside the lower spine and hips. The bone scan showed no areas of abnormal uptake and therefore will not be a reliable study to follow.  She still has not had any significant pain relief and is still complaining of pain in the lumbosacral area and right hip. She was angry with me because I didn't call her with the bone scan results. In fact, I never received the report. I did get her in with Dr. Roselind Messier very quickly and got his note. He had reviewed the bone scan and  mentioned the results of his note. She notes no new areas of pain. She continues to have chronic pain in her feet unrelated to her cancer. She continues to have gastric upset which she has related to the activity of her breast cancer. This was her initial presenting symptom when she was found to have progression to bone back in the fall of 2011.    Medications: reviewed  Allergies:  Allergies  Allergen Reactions  . Percodan (Oxycodone-Aspirin) Nausea Only  . 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 She developed significant diarrhea during her lumbosacral radiation which just completed on Friday. Genito-Urinary: No urinary tract symptoms Musculoskeletal: See above Neurologic: No new neurologic symptoms. Skin: No rash or ecchymosis Remaining ROS negative.  Physical Exam: Blood pressure 120/78, pulse 62, temperature 98 F (36.7 C), temperature source Oral, resp. rate 20, height 5\' 4"  (1.626 m), weight 177 lb 1.6 oz (80.332 kg). Wt Readings from Last 3 Encounters:  02/03/12 177 lb 1.6 oz (80.332 kg)  02/03/12 177 lb 9.6 oz (80.559 kg)  12/23/11 177 lb (80.287 kg)     General appearance: Well-nourished Caucasian woman HENNT: Pharynx no erythema or exudate Lymph nodes: No adenopathy Breasts: Bilateral mastectomies no new chest wall lesions Lungs: Clear to auscultation resonant to percussion Heart: Regular rhythm no murmur Abdomen: Soft nontender  Extremities: No edema no calf tenderness. Varicose veins Vascular: No cyanosis Neurologic: Motor strength is 5 over 5 reflexes 1+ symmetric Skin: No rash or ecchymosis  Lab Results: Lab Results  Component Value Date   WBC 7.0 12/23/2011   HGB 14.3 12/23/2011   HCT 42.3 12/23/2011   MCV 90.0 12/23/2011   PLT 287 12/23/2011     Chemistry      Component  Value Date/Time   NA 143 12/23/2011 1004   NA 141 09/24/2011 1420   K 4.1 12/23/2011 1004   K 4.0 09/24/2011 1420   CL 106 12/23/2011 1004   CL 104 09/24/2011 1420   CO2 28 12/23/2011 1004   CO2 26 09/24/2011 1420   BUN 12.0 12/23/2011 1004   BUN 17 09/24/2011 1420   CREATININE 0.7 12/23/2011 1004   CREATININE 0.58 09/24/2011 1420      Component Value Date/Time   CALCIUM 9.3 12/23/2011 1004   CALCIUM 9.4 09/24/2011 1420   ALKPHOS 74 12/23/2011 1004   ALKPHOS 78 09/24/2011 1420   AST 20 12/23/2011 1004   AST 18 09/24/2011 1420   ALT 18 12/23/2011 1004   ALT 16 09/24/2011 1420   BILITOT 0.60 12/23/2011 1004   BILITOT 0.4 09/24/2011 1420       Radiological Studies: See discussion above  Impression and Plan: Initial stage II multiple node positive ER positive cancer right breast diagnosed December 2006 treated with lumpectomy radiation and chemotherapy. Progression to bone while on tamoxifen September 2011.  Second primary presumed left breast detected by abnormal PET activity in the left axilla November 2011. While under evaluation for this abnormality she was found to have a third primary in the right breast. Bilateral mastectomies done 06/28/2010. No obvious primary found in the left breast tissue but the node was ER negative consistent with a second primary. The 2.2 cm invasive cancer in the right breast was ER positive PR negative HER-2 negative similar to her initial right breast cancer.  She received initial radiation to her left chest wall and left supraclavicular region. She developed abnormal erythema medial aspect right mastectomy scar with biopsies consistent with cutaneous metastases as noted above. She recently completed electron beam radiation to this area with good result.  Her bone disease was very well controlled on monthly Faslodex injections started in September 2011 until now. She has now progressed in bone as of August 2013. She just completed palliative radiation to the lumbosacral  spine 3500 cGy in 14 fractions 9/18 through 10/7. Plan: I'm going to start her on Xeloda initially 750 mg per meter squared total dose 1500 mg by mouth twice daily after breakfast and after supper on a one week on 1 week off schedule. I told her to wait another week to make sure she is completely over the radiation gastritis since the main side effect of Xeloda would be diarrhea. We were reviewed other potential side effects. These also include an interaction with Coumadin. We will need to watch her protimes more closely.  She is extremely frustrated and angry with her situation. She has lost multiple family members including her sister with breast cancer; she lost her husband about 2 years ago. When I even suggested the possibility that she go on disability at time of her recent visit here she became angry. The only thing that is keeping her going right now is her job. This is one of the main reasons I am going to put her on the Xeloda since it is a pill  it is more user friendly and hopefully will keep her working longer and not tie her to the office for a intravenous injections.   CC:. Dr. Quintin Alto; Dr. Antony Blackbird; Dr. Cicero Duck   Levert Feinstein, MD 10/7/20138:06 PM

## 2012-02-03 NOTE — Progress Notes (Signed)
  Radiation Oncology         [id number]) [phone] ________________________________  Name: SARANYA HARLIN MRN: [id number]  Date: 02/03/2012  DOB: [birth year]  End of Treatment Note  Diagnosis:   Metastatic breast cancer     Indication for treatment:  Painful involvement along the lumbosacral spine       Radiation treatment dates:   01/15/2012 through 02/03/2012  Site/dose:   Lumbosacral spine region, 3500 cGy in 14 fractions  Beams/energy:   18 MV photons. The patient was treated with a 4 field set up using at AP PA left and right lateral fields  Narrative: The patient tolerated radiation treatment relatively well.   She was able to continue working throughout her course of treatment. The patient did have some nausea and diarrhea  with her treatment however.  Her pain did improve somewhat. I would expect over the next month to have further improvement in pain issues  Plan: The patient has completed radiation treatment. The patient will return to radiation oncology clinic for routine followup in one month. I advised them to call or return sooner if they have any questions or concerns related to their recovery or treatment.  -----------------------------------  Billie Lade, PhD, MD

## 2012-02-03 NOTE — Progress Notes (Signed)
Patient presents to the clinic today after completing treatment in [place] this morning to her pelvis for a PUT/follow up with Dr. Roselind Messier. Patient is alert and oriented to person, place, and time. No distress noted. Steady gait noted. Pleasant affect noted. Patient reports bilateral hip and back pain constant 7 on a scale of 0-10 despite taking Norco 10 mg every four hours as prescribed. Patient denies headache today but, reports dizziness. Patient reports that over the weekend and into today she has experienced stomach discomfort and diarrhea for which she took lomotil. Patient reports decreased output of urine during the day but frequency at night. Patient denies pain or burning with urination and bowel movements. Patient reports the abdominal cramping can be very intense at times. Patient denies vaginal discharge. Patient reports nausea presently but, denies emesis. Patient reports a decreased appetite due to abdominal pain and diarrhea over the weekend. Reported all findings to Dr. Roselind Messier.

## 2012-02-03 NOTE — Progress Notes (Signed)
[name]    Radiation Oncology [address] [place]     [name], M.D. [place], [place] [address]               [name], M.D., Ph.D. Phone: [phone]      [name], M.D. Fax: [phone]      [name], M.D., Ph.D.         [name], M.D.         [name], M.D Weekly Treatment Management Note  Name: [name]     MRN: [id number]        CSN: [id number] Date: 02/03/2012      DOB: 1945/07/14  CC: Carolyn Alcide, MD         [place]    Status: Outpatient  Diagnosis: The encounter diagnosis was Breast cancer metastasized to bone.  Current Dose: 3500 cGy  Current Fraction: 14  Planned Dose: 3500 cGy  Narrative: [name] was seen today for weekly treatment management. The chart was checked and port films  were reviewed. She completes her radiation therapy today. Overall she's had improvement in her pain in the lower back/pelvis region.  She has had some problems with diarrhea typically 1-2 hours after her radiation therapy. She does have Imodium and Lomotil for this issue. She's also had some nausea with her treatments. The patient did receive her radiation therapy in [name]. She will be following up with me here in [name] however.  Percodan and Codeine Current Outpatient Prescriptions  Medication Sig Dispense Refill  . diphenoxylate-atropine (LOMOTIL) 2.5-0.025 MG per tablet       . Hydrocodone-Acetaminophen (NORCO PO) Take 10 mg by mouth. Every four hours prn pain      . lisinopril (PRINIVIL,ZESTRIL) 20 MG tablet Take 20 mg by mouth daily.       . pantoprazole (PROTONIX) 40 MG tablet Take 1 tablet (40 mg total) by mouth as needed. For indigestion  30 tablet  3  . prochlorperazine (COMPAZINE) 10 MG tablet       . warfarin (COUMADIN) 4 MG tablet Take 4 mg by mouth daily.       . ciprofloxacin (CIPRO) 500 MG tablet       . HYDROcodone-acetaminophen (VICODIN) 5-500 MG per tablet One to two tablets by mouth  every 6 hrs prn pain  Script given to pt 12/23/11  120 tablet  0  . predniSONE (STERAPRED UNI-PAK) 10 MG tablet        Labs:  Lab Results  Component Value Date   WBC 7.0 12/23/2011   HGB 14.3 12/23/2011   HCT 42.3 12/23/2011   MCV 90.0 12/23/2011   PLT 287 12/23/2011   Lab Results  Component Value Date   CREATININE 0.7 12/23/2011   BUN 12.0 12/23/2011   NA 143 12/23/2011   K 4.1 12/23/2011   CL 106 12/23/2011   CO2 28 12/23/2011   Lab Results  Component Value Date   ALT 18 12/23/2011   AST 20 12/23/2011   BILITOT 0.60 12/23/2011    Physical Examination:  weight is 177 lb 9.6 oz (80.559 kg). Her oral temperature is 98.1 F (36.7 C). Her blood pressure is 133/78 and her pulse is 67. Her respiration is 18.    Wt Readings from Last 3 Encounters:  02/03/12 177 lb 9.6 oz (80.559 kg)  12/23/11 177 lb (80.287 kg)  09/24/11 175 lb 1.6 oz (79.425 kg)     Lungs - Normal  respiratory effort, chest expands symmetrically. Lungs are clear to auscultation, no crackles or wheezes.  Heart has regular rhythm and rate  Abdomen is soft and non tender with normal bowel sounds  Assessment:  Patient tolerated her  treatments well  except for issues as above. I would expect her pain to improve over the next several weeks.  Plan: Routine followup in radiation oncology in one month. Patient will see medical oncology later today.   -----------------------------------  Carolyn Lade, PhD, MD

## 2012-02-03 NOTE — Progress Notes (Signed)
Faxed xeloda prescription to [facility] °

## 2012-02-04 ENCOUNTER — Other Ambulatory Visit: Payer: Self-pay | Admitting: *Deleted

## 2012-02-04 ENCOUNTER — Telehealth: Payer: Self-pay | Admitting: Oncology

## 2012-02-04 DIAGNOSIS — C7952 Secondary malignant neoplasm of bone marrow: Secondary | ICD-10-CM

## 2012-02-04 DIAGNOSIS — C50119 Malignant neoplasm of central portion of unspecified female breast: Secondary | ICD-10-CM

## 2012-02-04 MED ORDER — CAPECITABINE 500 MG PO TABS: ORAL_TABLET | ORAL | Status: DC

## 2012-02-04 NOTE — Telephone Encounter (Signed)
Received notice of RX transfer of xeloda to [facility] from [facility] due to pt's insurance.  [facility] ph # is [phone] & fax is [phone].

## 2012-02-04 NOTE — Telephone Encounter (Signed)
lvm regarding change in appt time for NOV.....mailed NOV appt schedule to pt.

## 2012-02-11 ENCOUNTER — Telehealth: Payer: Self-pay | Admitting: *Deleted

## 2012-02-11 ENCOUNTER — Encounter: Payer: Self-pay | Admitting: Oncology

## 2012-02-11 ENCOUNTER — Other Ambulatory Visit: Payer: Self-pay | Admitting: *Deleted

## 2012-02-11 MED ORDER — ONDANSETRON 8 MG PO TBDP: ORAL_TABLET | ORAL | Status: DC

## 2012-02-11 NOTE — Progress Notes (Addendum)
I called [employer] for a prior authorization. I spoke to [facility] and answered the questions over the phone. Pending Ref. # [id number].  02/12/12 Xeloda has been approved from 02/12/12 until 02/11/14 84/30.

## 2012-02-11 NOTE — Telephone Encounter (Signed)
Pt called informing office that she has not received Xeloda package.  Scrip given to [facility] 02/03/12 for pre-cert. Office received fax 02/04/12 informing office that script was transferred to [facility] due to insurance.  This RN called [facility] and they gave # to get authorization.  Information given to Thelma Barge to get authorization for medicine.    1620 Returned call to pt and explained that medication had to be pre-authorized and once we get that then Rx can send out.  Pt not happy states "it's not them that has cancer" but verbalized understanding of situation.  Confirmed next appt 03/09/12.

## 2012-02-19 ENCOUNTER — Encounter: Payer: Self-pay | Admitting: Oncology

## 2012-02-19 NOTE — Progress Notes (Signed)
Received approval letter from [employer].  Pt has been approved for Xeloda from 02/14/12 to 02/12/13. Emailed copy of letter to [place] from [employer].

## 2012-03-05 ENCOUNTER — Ambulatory Visit: Payer: Medicare PPO | Admitting: Radiation Oncology

## 2012-03-09 ENCOUNTER — Ambulatory Visit: Payer: Medicare PPO | Admitting: Oncology

## 2012-03-09 ENCOUNTER — Telehealth: Payer: Self-pay | Admitting: Oncology

## 2012-03-09 ENCOUNTER — Encounter: Payer: Self-pay | Admitting: Radiation Oncology

## 2012-03-09 ENCOUNTER — Ambulatory Visit
Admission: RE | Admit: 2012-03-09 | Discharge: 2012-03-09 | Disposition: A | Discharge status: Home / Self Care | Payer: Medicare PPO | Source: Ambulatory Visit | Attending: Radiation Oncology | Admitting: Radiation Oncology

## 2012-03-09 ENCOUNTER — Ambulatory Visit (HOSPITAL_BASED_OUTPATIENT_CLINIC_OR_DEPARTMENT_OTHER): Payer: Medicare PPO | Admitting: Oncology

## 2012-03-09 ENCOUNTER — Other Ambulatory Visit (HOSPITAL_BASED_OUTPATIENT_CLINIC_OR_DEPARTMENT_OTHER): Payer: Medicare PPO | Admitting: Lab

## 2012-03-09 VITALS — BP 161/87 | HR 58 | Temp 98.0°F | Resp 18 | Wt 175.2 lb

## 2012-03-09 VITALS — BP 139/73 | HR 67 | Temp 98.5°F | Resp 20 | Ht 64.0 in | Wt 174.7 lb

## 2012-03-09 DIAGNOSIS — C50919 Malignant neoplasm of unspecified site of unspecified female breast: Secondary | ICD-10-CM

## 2012-03-09 DIAGNOSIS — Z8672 Personal history of thrombophlebitis: Secondary | ICD-10-CM

## 2012-03-09 DIAGNOSIS — I8000 Phlebitis and thrombophlebitis of superficial vessels of unspecified lower extremity: Secondary | ICD-10-CM

## 2012-03-09 DIAGNOSIS — C7951 Secondary malignant neoplasm of bone: Secondary | ICD-10-CM

## 2012-03-09 DIAGNOSIS — C44599 Other specified malignant neoplasm of skin of other part of trunk: Secondary | ICD-10-CM

## 2012-03-09 DIAGNOSIS — Z7901 Long term (current) use of anticoagulants: Secondary | ICD-10-CM

## 2012-03-09 HISTORY — DX: Reserved for concepts with insufficient information to code with codable children: IMO0002

## 2012-03-09 LAB — COMPREHENSIVE METABOLIC PANEL (CC13)
AST: 21 U/L (ref 5–34)
Albumin: 3.6 g/dL (ref 3.5–5.0)
Alkaline Phosphatase: 81 U/L (ref 40–150)
BUN: 16 mg/dL (ref 7.0–26.0)
Calcium: 9.4 mg/dL (ref 8.4–10.4)
Creatinine: 0.7 mg/dL (ref 0.6–1.1)
Glucose: 105 mg/dl — ABNORMAL HIGH (ref 70–99)
Potassium: 4.3 mEq/L (ref 3.5–5.1)

## 2012-03-09 LAB — CBC WITH DIFFERENTIAL/PLATELET
EOS%: 1 % (ref 0.0–7.0)
Eosinophils Absolute: 0.1 10*3/uL (ref 0.0–0.5)
LYMPH%: 12.6 % — ABNORMAL LOW (ref 14.0–49.7)
MCH: 30 pg (ref 25.1–34.0)
MCHC: 33.5 g/dL (ref 31.5–36.0)
MCV: 89.4 fL (ref 79.5–101.0)
MONO%: 8.9 % (ref 0.0–14.0)
NEUT#: 3.9 10*3/uL (ref 1.5–6.5)
Platelets: 242 10*3/uL (ref 145–400)
RBC: 4.64 10*6/uL (ref 3.70–5.45)
RDW: 13.8 % (ref 11.2–14.5)

## 2012-03-09 LAB — PROTIME-INR
INR: 2.9 (ref 2.00–3.50)
Protime: 34.8 Seconds — ABNORMAL HIGH (ref 10.6–13.4)

## 2012-03-09 NOTE — Progress Notes (Signed)
Hematology and Oncology Follow Up Visit  [name] [id number] 29-Jul-1945 66 y.o. 03/09/2012 10:52 AM   Principle Diagnosis: Encounter Diagnosis  Name Primary?  . Breast cancer metastasized to bone Yes     Interim History:   Followup visit for this 66 year old woman with breast cancer metastatic to bone. This occurred while she was on tamoxifen in September 2011. She had an excellent and durable response to monthly Faslodex injections until recently when she complained of increasing low back and right hip pain at time of on August 26 visit here. CA 27.29 tumor marker rose to 56 units. MRI of her lumbosacral spine and hips done on September 3 showed multiple areas of metastatic disease largest at L5 which almost completely replaces the L5 vertebral body. Additional lesions in the sacrum, iliac wings, and right hip. A small 0.8 cm lesion in the right femoral neck with no signs of impending fracture. Additional findings included bilateral trochanteric bursitis much worse on the right and tearing of the gluteus minimus muscle. No soft tissue mass.  I referred her back to radiation oncology. She completed a course of radiation to the lumbosacral spine. Dr. Roselind Messier elected not to treat the right hip since he felt most of the symptoms were related to bursitis and myositis and not to be cancer. I did a bone scan to see if there were any other lesions outside the lower spine and hips. The bone scan showed no areas of abnormal uptake and therefore will not be a reliable study to follow. I started her on a course of single agent Xeloda 750 mg meter squared twice daily one week on 1 week off subsequent to her October 7 visit here. Due to problems with insurance reimbursement she did not actually get started until October 25. She's only had one full week of therapy and started a second week November 9. So far she has not had any major side effects. She is getting some delayed nausea during the night and on  the days that she takes the Xeloda. Mouth is sore but no ulcers. No hand-foot syndrome. No diarrhea. On Coumadin 5 mg daily, INR today is 2.9 so I will continue the Coumadin at the current dose but continue to monitor on an every other week basis and make adjustments as necessary the week that she is on the Xeloda. New line she continues to have muscle spasms right chest wall and now is starting to get pain in her left shoulder. No change in chronic pain of her feet which is an orthopedic issue not related to her breast cancer.     Medications: reviewed  Allergies:  Allergies  Allergen Reactions  . Percodan (Oxycodone-Aspirin) Nausea Only  . 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  Physical Exam: Blood pressure 139/73, pulse 67, temperature 98.5 F (36.9 C), temperature source Oral, resp. rate 20, height 5\' 4"  (1.626 m), weight 174 lb 11.2 oz (79.243 kg). Wt Readings from Last 3 Encounters:  03/09/12 174 lb 11.2 oz (79.243 kg)  02/03/12 177 lb 1.6 oz (80.332 kg)  02/03/12 177 lb 9.6 oz (80.559 kg)     General appearance: Well-nourished Caucasian woman HENNT: Pharynx no erythema exudate or ulcer Lymph  nodes: No adenopathy Breasts: Bilateral mastectomies, no chest wall lesions Lungs: Clear to auscultation resonant to percussion Heart: Regular rhythm no murmur Abdomen: Soft, nontender, no mass, no organomegaly Extremities: No edema, no calf tenderness Vascular: No cyanosis Neurologic: Motor strength 5 over 5, reflexes 1+ symmetric Skin: No rash or ecchymosis  Lab Results: Lab Results  Component Value Date   WBC 5.1  03/09/2012   HGB 13.9 03/09/2012   HCT 41.5 03/09/2012   MCV 89.4 03/09/2012   PLT 242 03/09/2012     Chemistry      Component Value Date/Time   NA 142 03/09/2012 0913   NA 141 09/24/2011 1420   K 4.3 03/09/2012 0913   K 4.0 09/24/2011 1420   CL 107 03/09/2012 0913   CL 104 09/24/2011 1420   CO2 30* 03/09/2012 0913   CO2 26 09/24/2011 1420   BUN 16.0 03/09/2012 0913   BUN 17 09/24/2011 1420   CREATININE 0.7 03/09/2012 0913   CREATININE 0.58 09/24/2011 1420      Component Value Date/Time   CALCIUM 9.4 03/09/2012 0913   CALCIUM 9.4 09/24/2011 1420   ALKPHOS 81 03/09/2012 0913   ALKPHOS 78 09/24/2011 1420   AST 21 03/09/2012 0913   AST 18 09/24/2011 1420   ALT 21 03/09/2012 0913   ALT 16 09/24/2011 1420   BILITOT 0.60 03/09/2012 0913   BILITOT 0.4 09/24/2011 1420       Radiological Studies: No results found.  Impression and Plan: #1. Metachronous primary bilateral ER-positive (right) and PR negative ( left) breast cancers now metastatic to bone. Stable on a trial of Xeloda. 2 early to assess response.  Initial stage II multiple node positive ER positive cancer right breast diagnosed December 2006 treated with lumpectomy radiation and chemotherapy. Progression to bone while on tamoxifen September 2011.  Second primary presumed left breast detected by abnormal PET activity in the left axilla November 2011. While under evaluation for this abnormality she was found to have a third primary in the right breast. Bilateral mastectomies done 06/28/2010. No obvious primary found in the left breast tissue but the node was ER negative consistent with a second primary. The 2.2 cm invasive cancer in the right breast was ER positive PR negative HER-2 negative similar to her initial right breast cancer.  She received initial radiation to her left chest wall and left supraclavicular region. She developed abnormal erythema medial aspect right mastectomy scar with biopsies consistent with cutaneous  metastases as noted above. She received electron beam radiation to this area with good result.  Her bone disease was very well controlled on monthly Faslodex injections started in September 2011 until  August 2013. She just completed palliative radiation to the lumbosacral spine 3500 cGy in 14 fractions 9/18 through 10/7. She will continue single agent so to chemotherapy. Will reevaluate her in 2 months.  #2. History of extensive superficial thrombophlebitis lower extremity In view of metastatic cancer need for chemotherapy and previous hormonal therapy she is on full dose anticoagulation with Coumadin. INR today acceptable at 2.9 on Xeloda. I will continue to monitor closely and make any adjustments in Coumadin on the week that she takes the Xeloda if needed.  #3. Irritable bowel syndrome.   #4. right rotator cuff repair. 4/13  #5. Chronic orthopedic problems with her feet causing chronic pain.   #6. Essential hypertension.    CC:. Dr. Quintin Alto; Dr. Cyndia Bent; Dr. Cline Cools, MD 11/11/201310:52 AM

## 2012-03-09 NOTE — Progress Notes (Addendum)
Patient presents to the clinic today unaccompanied for a follow up appointment with Dr. Roselind Messier. Patient alert and oriented to person, place, and time. No distress noted. Steady gait noted. Pleasant affect noted. Patient reports chronic low back pain 7 on a scale of 0-10 for which she takes hydrocodone every four hours. 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 Patient reports control of bladder and bowel. Patient denies headache. Patient denies dizziness. Patient reports nausea during the middle of the night frequently but, denies taking zofran or compazine. Blood pressure elevated today. Patient reports elevated bp due in part to long wait here today and [place] [employer]. Patient reports that she continues to have CBC and INR drawn every 2 weeks because of the xeloda. Reported all findings to Dr. Roselind Messier.

## 2012-03-09 NOTE — Progress Notes (Signed)
  Radiation Oncology         ([phone]) [phone] ________________________________  Name: [name] MRN: [id number]  Date: 03/09/2012  DOB: 1946/04/09  Follow-Up Visit Note  CC: Carolyn Alcide, MD  Levert Feinstein, MD  Diagnosis:   Metastatic breast  Interval Since Last Radiation:  One month   Narrative:  The patient returns today for routine follow-up.  She continues to have some pain in the lower back but overall has had some improvement in her pain in this area secondary to her palliative radiation therapy.   she has started on Xeloda and seems to be tolerating this well. 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  Meds: Current Outpatient Prescriptions  Medication Sig Dispense Refill  . capecitabine (XELODA) 500 MG tablet 3 tabs po after breakfast & 3 tabs po after supper for 7 days on then 7 days rest then repeat cycle  84 tablet  4  . diphenoxylate-atropine (LOMOTIL) 2.5-0.025 MG per tablet as needed.       . Hydrocodone-Acetaminophen (NORCO PO) Take 10 mg by mouth. Every four hours prn pain      . lisinopril (PRINIVIL,ZESTRIL) 20 MG tablet Take 20 mg by mouth daily.       . pantoprazole (PROTONIX) 40 MG tablet Take 1 tablet (40 mg total) by mouth as needed. For indigestion  30 tablet  3  . warfarin (COUMADIN) 4 MG tablet Take 4 mg by mouth daily.       . ondansetron (ZOFRAN ODT) 8 MG disintegrating tablet 1 tablet SL every 6-8 hours and needed for nausea/vomiting. Prescription given to patient  25 tablet  0  . prochlorperazine (COMPAZINE) 10 MG tablet Take 10 mg by mouth every 6 (six) hours as needed.         Physical Findings: The patient is in no acute distress. Patient is alert and oriented.  weight is 175 lb 3.2 oz (79.47 kg). Her oral temperature is 98 F (36.7 C). Her blood pressure is 161/87 and her pulse is 58. Her respiration is 18 and oxygen saturation is 96%. .  No palpable  supraclavicular or axillary adenopathy. Lungs are clear to auscultation. The heart has regular rhythm and rate.   the abdomen is soft and nontender with normal bowel sounds. On neurological examination motor strength is 5 out of 5 in the proximal and distal muscle groups of the lower extremities.  Lab Findings: Lab Results  Component Value Date   WBC 5.1 03/09/2012   HGB 13.9 03/09/2012   HCT 41.5 03/09/2012   MCV 89.4 03/09/2012   PLT 242 03/09/2012    @LASTCHEM @  Radiographic Findings: No results found.  Impression:  The patient is recovering from the effects of radiation.  She continues to have pain in the treatment area but has had some modest improvement.  Plan:  When necessary followup. Patient continue close followup with medical oncology and as above is on Xeloda at this time.  _____________________________________   Billie Lade, PhD, MD

## 2012-03-09 NOTE — Patient Instructions (Signed)
Continue Xeloda at current dose Check labs CBC and PT/INR every 2 weeks for now Follow up visit w Dr Reece Agar May 04, 2012 @10 :30 for lab, 11 AM MD

## 2012-03-09 NOTE — Telephone Encounter (Signed)
gv and printed pt appt schedule for Jan 2014 °

## 2012-03-19 ENCOUNTER — Telehealth: Payer: Self-pay | Admitting: *Deleted

## 2012-03-19 NOTE — Telephone Encounter (Signed)
Script given to pt 03/09/12 for cbc, PT/INR q 2 wks & please fax to [phone] by Dr. Cyndie Chime.

## 2012-03-31 ENCOUNTER — Other Ambulatory Visit: Payer: Self-pay | Admitting: Oncology

## 2012-04-15 ENCOUNTER — Telehealth: Payer: Self-pay | Admitting: *Deleted

## 2012-04-15 NOTE — Telephone Encounter (Signed)
Patient called and left message that she spent 6 1/2 hrs in [facility] ED.  Doctor there thought cancer was in her abdomen...but it wasn't.  Thought probably diverticulitis.  Did CT of abdomen.  If Dr. Cyndie Chime wants CT he can call [facility] for results/picture.  Will let Dr. Cyndie Chime know.

## 2012-04-28 ENCOUNTER — Telehealth: Payer: Self-pay | Admitting: *Deleted

## 2012-04-28 NOTE — Telephone Encounter (Signed)
Received vm call from pt stating that she had been in [facility] ED & had scans/labs/etc & wants to make sure Dr. Cyndie Chime gets those records before her visit 05/04/12.  Called [facility] HIM dept & requested records.

## 2012-05-01 ENCOUNTER — Telehealth: Payer: Self-pay | Admitting: Oncology

## 2012-05-01 NOTE — Telephone Encounter (Signed)
Pt called , returned call gave her appt for 05/04/12, lab and md

## 2012-05-04 ENCOUNTER — Other Ambulatory Visit: Payer: Self-pay | Admitting: *Deleted

## 2012-05-04 ENCOUNTER — Other Ambulatory Visit (HOSPITAL_BASED_OUTPATIENT_CLINIC_OR_DEPARTMENT_OTHER): Payer: Medicare PPO | Admitting: Lab

## 2012-05-04 ENCOUNTER — Ambulatory Visit (HOSPITAL_BASED_OUTPATIENT_CLINIC_OR_DEPARTMENT_OTHER): Payer: Medicare PPO | Admitting: Oncology

## 2012-05-04 VITALS — BP 137/80 | HR 71 | Temp 98.7°F | Resp 20 | Ht 64.0 in | Wt 173.1 lb

## 2012-05-04 DIAGNOSIS — K219 Gastro-esophageal reflux disease without esophagitis: Secondary | ICD-10-CM

## 2012-05-04 DIAGNOSIS — C50419 Malignant neoplasm of upper-outer quadrant of unspecified female breast: Secondary | ICD-10-CM

## 2012-05-04 DIAGNOSIS — C7951 Secondary malignant neoplasm of bone: Secondary | ICD-10-CM

## 2012-05-04 DIAGNOSIS — Z8672 Personal history of thrombophlebitis: Secondary | ICD-10-CM

## 2012-05-04 DIAGNOSIS — R52 Pain, unspecified: Secondary | ICD-10-CM

## 2012-05-04 DIAGNOSIS — Z86718 Personal history of other venous thrombosis and embolism: Secondary | ICD-10-CM

## 2012-05-04 DIAGNOSIS — C50919 Malignant neoplasm of unspecified site of unspecified female breast: Secondary | ICD-10-CM

## 2012-05-04 LAB — CBC WITH DIFFERENTIAL/PLATELET
Basophils Absolute: 0 10*3/uL (ref 0.0–0.1)
Eosinophils Absolute: 0.1 10*3/uL (ref 0.0–0.5)
HCT: 40.2 % (ref 34.8–46.6)
HGB: 13.8 g/dL (ref 11.6–15.9)
MCV: 93.7 fL (ref 79.5–101.0)
NEUT#: 4.5 10*3/uL (ref 1.5–6.5)
NEUT%: 82.2 % — ABNORMAL HIGH (ref 38.4–76.8)
RDW: 17.4 % — ABNORMAL HIGH (ref 11.2–14.5)
lymph#: 0.4 10*3/uL — ABNORMAL LOW (ref 0.9–3.3)

## 2012-05-04 LAB — COMPREHENSIVE METABOLIC PANEL (CC13)
ALT: 13 U/L (ref 0–55)
AST: 19 U/L (ref 5–34)
Albumin: 3.4 g/dL — ABNORMAL LOW (ref 3.5–5.0)
Alkaline Phosphatase: 86 U/L (ref 40–150)
Calcium: 9.1 mg/dL (ref 8.4–10.4)
Chloride: 106 mEq/L (ref 98–107)
Potassium: 3.4 mEq/L — ABNORMAL LOW (ref 3.5–5.1)
Sodium: 143 mEq/L (ref 136–145)
Total Protein: 6.7 g/dL (ref 6.4–8.3)

## 2012-05-04 LAB — PROTIME-INR: INR: 2.2 (ref 2.00–3.50)

## 2012-05-04 NOTE — Progress Notes (Signed)
Hematology and Oncology Follow Up Visit  [name] [id number] 02/08/1946 67 y.o. 05/04/2012 8:13 PM   Principle Diagnosis: Encounter Diagnoses  Name Primary?  . Breast cancer metastasized to bone Yes  . GERD      Interim History:   Followup visit for this 67 year old woman with metachronous bilateral breast cancers metastatic to bone and chest wall. She continues to have a significant amount of pain in her back although I do believe she has had a response to lumbosacral radiation. She never got the Duragesic prescription filled due to the price. She has limited resources. She has ongoing problems with epigastric abdominal pain of unclear etiology. She had another attack this morning. She was seen on a urgent basis at the [facility] emergency Department on 04/15/2012 for acute onset of left lower quadrant abdominal pain. No diarrhea. No vomiting. A CT scan was done which was unremarkable for any obvious spread of her cancer or active diverticulitis. Chest radiograph done at that time also unremarkable. Medications: reviewed  Allergies:  Allergies  Allergen Reactions  . Percodan (Oxycodone-Aspirin) Nausea Only  . Codeine Nausea Only    Review of Systems: Constitutional:   Chronic pain and fatigue although she continues to work Respiratory: No cough or dyspnea Cardiovascular:  No ischemic type chest pain Gastrointestinal: See above Genito-Urinary: No urinary frequency, urgency, Musculoskeletal: Ongoing problems with varicose veins right lower extremity, pain and swelling of her feet particularly the right great toe. Neurologic: No headache or change in vision Skin: No rash or ecchymosis Remaining ROS negative.  Physical Exam: Blood pressure 137/80, pulse 71, temperature 98.7 F (37.1 C), temperature source Oral, resp. rate 20, height 5\' 4"  (1.626 m), weight 173 lb 1.6 oz (78.518 kg). Wt Readings from Last 3 Encounters:  05/04/12 173 lb 1.6 oz (78.518 kg)   03/09/12 175 lb 3.2 oz (79.47 kg)  03/09/12 174 lb 11.2 oz (79.243 kg)     General appearance: Well-nourished Caucasian woman HENNT: Pharynx no erythema or exudate Lymph nodes: No adenopathy Breasts: Bilateral mastectomies. Complete report question of previous chest wall lesions on the right. No new lesions Lungs: Clear to auscultation resonant to percussion Heart: Regular rhythm no murmur Abdomen: Soft, minimally tender in the epigastric region, no mass, no organomegaly. No flank tenderness Extremities: No edema, no calf tenderness, prominent varicose veins right calf area Vascular: No cyanosis Neurologic: Motor strength 5 over 5, reflexes 1+ symmetric Skin: No rash or ecchymosis  Lab Results: Lab Results  Component Value Date   WBC 5.5 05/04/2012   HGB 13.8 05/04/2012   HCT 40.2 05/04/2012   MCV 93.7 05/04/2012   PLT 241 05/04/2012     Chemistry      Component Value Date/Time   NA 143 05/04/2012 1031   NA 141 09/24/2011 1420   K 3.4* 05/04/2012 1031   K 4.0 09/24/2011 1420   CL 106 05/04/2012 1031   CL 104 09/24/2011 1420   CO2 28 05/04/2012 1031   CO2 26 09/24/2011 1420   BUN 16.0 05/04/2012 1031   BUN 17 09/24/2011 1420   CREATININE 0.8 05/04/2012 1031   CREATININE 0.58 09/24/2011 1420      Component Value Date/Time   CALCIUM 9.1 05/04/2012 1031   CALCIUM 9.4 09/24/2011 1420   ALKPHOS 86 05/04/2012 1031   ALKPHOS 78 09/24/2011 1420   AST 19 05/04/2012 1031   AST 18 09/24/2011 1420   ALT 13 05/04/2012 1031   ALT 16 09/24/2011 1420   BILITOT  0.55 05/04/2012 1031   BILITOT 0.4 09/24/2011 1420      Impression and Plan: #1. Metachronous primary bilateral ER-positive (right) and PR negative ( left) breast cancers now metastatic to bone and chest wall. Stable on oral Xeloda started in early November 2013. Initial stage II multiple node positive ER positive cancer right breast diagnosed December 2006 treated with lumpectomy radiation and chemotherapy. Progression to bone while on tamoxifen September  2011.  Second primary presumed left breast detected by abnormal PET activity in the left axilla November 2011. While under evaluation for this abnormality she was found to have a third primary in the right breast. Bilateral mastectomies done 06/28/2010. No obvious primary found in the left breast tissue but the node was ER negative consistent with a second primary. The 2.2 cm invasive cancer in the right breast was ER positive PR negative HER-2 negative similar to her initial right breast cancer.  She received initial radiation to her left chest wall and left supraclavicular region. She developed abnormal erythema medial aspect right mastectomy scar with biopsies consistent with cutaneous metastases as noted above. She received electron beam radiation to this area with good result.  Her bone disease was very well controlled on monthly Faslodex injections started in September 2011 until August 2013. She  completed palliative radiation to the lumbosacral spine 3500 cGy in 14 fractions 9/18 through 10/7. She will continue single agent Xeloda chemotherapy. And when he get an MRI of her spine next week to assess response.  CA 27-29 tumor marker which rose to 56 units in August fell to 37 units by October in response to lumbosacral radiation. I did today is 38 units-stable.  #2. Ongoing gastritis. She is only been taking Protonix on a sporadic basis. I recommended she take 40 mg twice daily for 4 weeks and then reevaluate. If her symptoms persist, I would like to get her a followup appointment with Dr. Benard Rink, her gastroenterologist.  #3. History of extensive superficial thrombophlebitis. In view of her metastatic cancer and need for chemotherapy I started her on therapeutic Coumadin which she remains on at present. INR today 2.2  #4. Multifactorial pain from cancer and orthopedic problems. I am increasing her hydrocodone/acetaminophen  from 5/325 to 10 mg/325 and she can take 2 tablets every 4 hours as needed.  I offered to give her a prescription for methadone which is the only reasonably priced long-acting narcotic available. However she is going to go ahead and pay this $62 co-pay and tried the Duragesic 25 mcg every 3 days. She's been very sensitive to narcotics in the past. This is a reason to start at the lowest dose to assess tolerance.    CC:.    Levert Feinstein, MD 1/6/20148:13 PM

## 2012-05-04 NOTE — Telephone Encounter (Signed)
gv and printed pt March appt schedule...the patient aware central scheduling will contact with d/t for MR

## 2012-05-04 NOTE — Patient Instructions (Signed)
MRI of spine for next Monday,January 13  MD visit on 06/29/12 @ 10 AM

## 2012-05-05 ENCOUNTER — Other Ambulatory Visit: Payer: Self-pay | Admitting: *Deleted

## 2012-05-05 DIAGNOSIS — R52 Pain, unspecified: Secondary | ICD-10-CM

## 2012-05-05 DIAGNOSIS — C50919 Malignant neoplasm of unspecified site of unspecified female breast: Secondary | ICD-10-CM

## 2012-05-05 MED ORDER — HYDROCODONE-ACETAMINOPHEN 10-325 MG PO TABS: ORAL_TABLET | ORAL | Status: DC

## 2012-05-05 MED ORDER — PANTOPRAZOLE SODIUM 40 MG PO TBEC: 40.0000 mg | DELAYED_RELEASE_TABLET | Freq: Two times a day (BID) | ORAL | Status: DC

## 2012-05-06 ENCOUNTER — Telehealth: Payer: Self-pay | Admitting: *Deleted

## 2012-05-06 NOTE — Telephone Encounter (Signed)
Pt given results of CA 27.29 per Dr. Cyndie Chime.

## 2012-05-06 NOTE — Telephone Encounter (Signed)
Message copied by Sabino Snipes on Wed May 06, 2012  5:34 PM ------      Message from: Levert Feinstein      Created: Tue May 05, 2012  2:31 PM       Call pt Ca 27.29 upper normal at 38 - no signif change - was 37 last time

## 2012-05-12 ENCOUNTER — Ambulatory Visit (HOSPITAL_COMMUNITY)
Admission: RE | Admit: 2012-05-12 | Discharge: 2012-05-12 | Disposition: A | Discharge status: Home / Self Care | Payer: Medicare PPO | Source: Ambulatory Visit | Attending: Oncology | Admitting: Oncology

## 2012-05-12 ENCOUNTER — Other Ambulatory Visit: Payer: Self-pay | Admitting: Oncology

## 2012-05-12 DIAGNOSIS — C50919 Malignant neoplasm of unspecified site of unspecified female breast: Secondary | ICD-10-CM | POA: Insufficient documentation

## 2012-05-12 DIAGNOSIS — R209 Unspecified disturbances of skin sensation: Secondary | ICD-10-CM | POA: Insufficient documentation

## 2012-05-12 DIAGNOSIS — M79609 Pain in unspecified limb: Secondary | ICD-10-CM | POA: Insufficient documentation

## 2012-05-12 DIAGNOSIS — R5381 Other malaise: Secondary | ICD-10-CM | POA: Insufficient documentation

## 2012-05-12 DIAGNOSIS — M5124 Other intervertebral disc displacement, thoracic region: Secondary | ICD-10-CM | POA: Insufficient documentation

## 2012-05-12 DIAGNOSIS — M47817 Spondylosis without myelopathy or radiculopathy, lumbosacral region: Secondary | ICD-10-CM | POA: Insufficient documentation

## 2012-05-12 DIAGNOSIS — C7951 Secondary malignant neoplasm of bone: Secondary | ICD-10-CM

## 2012-05-12 MED ORDER — GADOBENATE DIMEGLUMINE 529 MG/ML IV SOLN
16.0000 mL | Freq: Once | INTRAVENOUS | Status: AC | PRN
  Administered 2012-05-12: 16 mL via INTRAVENOUS

## 2012-05-13 ENCOUNTER — Other Ambulatory Visit: Payer: Self-pay | Admitting: Oncology

## 2012-05-13 DIAGNOSIS — C50919 Malignant neoplasm of unspecified site of unspecified female breast: Secondary | ICD-10-CM

## 2012-05-14 ENCOUNTER — Telehealth: Payer: Self-pay | Admitting: Oncology

## 2012-05-14 ENCOUNTER — Telehealth: Payer: Self-pay | Admitting: *Deleted

## 2012-05-14 NOTE — Telephone Encounter (Signed)
Patient calls.  She is due to restart her xeloda on Saturday 1/18.  She just received a new shipment.  She is not clear on what Dr. Cyndie Chime wants her to do.  Will discuss with him on Friday and then call her back  [phone].pt. phone

## 2012-05-14 NOTE — Telephone Encounter (Signed)
Pt scheduled for PET scan , pt will be notified by Tiffany from radiology scheduling, MD notified of appt date

## 2012-05-15 ENCOUNTER — Telehealth: Payer: Self-pay | Admitting: *Deleted

## 2012-05-15 NOTE — Telephone Encounter (Signed)
Late Entry:  Spoke with pt this am & informed to stop xeloda & message left with [facility]/Managed [facility] to check on coverage for everolimis & tamoxifen.  Pt reports that she had a blood clot on tamoxifen & had to take shots.  Informed that we would be back in touch with her regarding an MD appt for the same day as her PET scan. 05/23/11.

## 2012-05-18 ENCOUNTER — Other Ambulatory Visit: Payer: Self-pay | Admitting: Oncology

## 2012-05-21 ENCOUNTER — Other Ambulatory Visit: Payer: Self-pay | Admitting: *Deleted

## 2012-05-22 ENCOUNTER — Ambulatory Visit (HOSPITAL_BASED_OUTPATIENT_CLINIC_OR_DEPARTMENT_OTHER): Payer: Medicare PPO | Admitting: Nurse Practitioner

## 2012-05-22 ENCOUNTER — Encounter (HOSPITAL_COMMUNITY): Payer: Self-pay

## 2012-05-22 ENCOUNTER — Encounter (HOSPITAL_COMMUNITY)
Admission: RE | Admit: 2012-05-22 | Discharge: 2012-05-22 | Disposition: A | Discharge status: Home / Self Care | Payer: Medicare PPO | Source: Ambulatory Visit | Attending: Oncology | Admitting: Oncology

## 2012-05-22 VITALS — BP 101/58 | HR 70 | Temp 97.7°F | Resp 20 | Ht 64.0 in | Wt 172.7 lb

## 2012-05-22 DIAGNOSIS — J984 Other disorders of lung: Secondary | ICD-10-CM | POA: Insufficient documentation

## 2012-05-22 DIAGNOSIS — E279 Disorder of adrenal gland, unspecified: Secondary | ICD-10-CM | POA: Insufficient documentation

## 2012-05-22 DIAGNOSIS — K7689 Other specified diseases of liver: Secondary | ICD-10-CM | POA: Insufficient documentation

## 2012-05-22 DIAGNOSIS — C7951 Secondary malignant neoplasm of bone: Secondary | ICD-10-CM | POA: Insufficient documentation

## 2012-05-22 DIAGNOSIS — C7952 Secondary malignant neoplasm of bone marrow: Secondary | ICD-10-CM

## 2012-05-22 DIAGNOSIS — J438 Other emphysema: Secondary | ICD-10-CM | POA: Insufficient documentation

## 2012-05-22 DIAGNOSIS — I700 Atherosclerosis of aorta: Secondary | ICD-10-CM | POA: Insufficient documentation

## 2012-05-22 DIAGNOSIS — C50919 Malignant neoplasm of unspecified site of unspecified female breast: Secondary | ICD-10-CM

## 2012-05-22 DIAGNOSIS — Z86718 Personal history of other venous thrombosis and embolism: Secondary | ICD-10-CM

## 2012-05-22 DIAGNOSIS — I708 Atherosclerosis of other arteries: Secondary | ICD-10-CM | POA: Insufficient documentation

## 2012-05-22 DIAGNOSIS — Z9089 Acquired absence of other organs: Secondary | ICD-10-CM | POA: Insufficient documentation

## 2012-05-22 DIAGNOSIS — K5730 Diverticulosis of large intestine without perforation or abscess without bleeding: Secondary | ICD-10-CM | POA: Insufficient documentation

## 2012-05-22 DIAGNOSIS — R52 Pain, unspecified: Secondary | ICD-10-CM

## 2012-05-22 MED ORDER — HYDROCODONE-ACETAMINOPHEN 10-325 MG PO TABS: ORAL_TABLET | ORAL | Status: DC

## 2012-05-22 MED ORDER — FLUDEOXYGLUCOSE F - 18 (FDG) INJECTION
19.2000 | Freq: Once | INTRAVENOUS | Status: AC | PRN
  Administered 2012-05-22: 19.2 via INTRAVENOUS

## 2012-05-22 NOTE — Progress Notes (Signed)
OFFICE PROGRESS NOTE  Interval history:  Ms. 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 She was initially diagnosed with stage II multinode-positive ER positive cancer of the right breast December 2006 treated with lumpectomy, radiation and chemotherapy. Progression to bone on tamoxifen September 2011. Second primary presumed left breast detected by abnormal PET activity in the left axilla November 2011. While under evaluation of that abnormality she was found to have a third primary in the right breast. She underwent bilateral mastectomies 06/28/2010. No obvious primary was found in the left breast tissue but the node was ER negative consistent with a second primary. The 2.2 cm invasive cancer in the right breast was ER positive, PR negative and HER-2 negative similar to the initial right breast cancer. She received radiation to the left chest wall and left supraclavicular region. She developed abnormal erythema medial aspect right mastectomy scar with biopsies consistent with cutaneous metastases. She received electron beam radiation to this area. Bone disease was controlled on monthly Faslodex injections started in September 2011 until August 2013. She completed palliative radiation to the lumbosacral spine 01/15/2012 through 02/03/2012. She was started on Xeloda in November 2013.  Restaging MRI of the thoracic spine showed metastatic lesions in each thoracic vertebrae except for T7. There were no pathologic fractures and no extension of tumor into the spinal canal or neural foramina. Some of the metastases involved the posterior elements. There was no abnormal meningeal enhancement. MRI of the lumbar spine showed progression of metastatic disease at T11, T12 and L1. Metastatic disease at L2-S3 had progressed. There was a small amount of tumor extending through the posterior margin of L4 into the spinal canal but not compressing the thecal  sac. This was a new finding.  Restaging PET scan done earlier today showed bony lesions corresponding to the known osseous metastatic disease not currently hypermetabolic. Hypodense hepatic lesions may represent cysts but technically nonspecific. No current hepatic hypermetabolic activity was observed. In the chest there were stable postoperative and postradiation therapy related findings on the right with no findings of active thoracic malignancy.  She continues to have back pain. She tried a Duragesic patch for several days but did not tolerate it well citing severe constipation. She is no longer utilizing the Duragesic patch. She is taking Norco 10/325 as needed. Gastritis symptoms improved since beginning Protonix twice daily. She notes loose stools after she takes Protonix.   Objective: Blood pressure 101/58, pulse 70, temperature 97.7 F (36.5 C), temperature source Oral, resp. rate 20, height 5\' 4"  (1.626 m), weight 172 lb 11.2 oz (78.336 kg).  Oropharynx is without thrush or ulceration. Lungs are clear. Regular cardiac rhythm. Tender at the epigastric region. No hepatomegaly. Extremities are without edema. Calves nontender. Motor strength 5 over 5.  Lab Results: Lab Results  Component Value Date   WBC 5.5 05/04/2012   HGB 13.8 05/04/2012   HCT 40.2 05/04/2012   MCV 93.7 05/04/2012   PLT 241 05/04/2012    Chemistry:    Chemistry      Component Value Date/Time   NA 143 05/04/2012 1031   NA 141 09/24/2011 1420   K 3.4* 05/04/2012 1031   K 4.0 09/24/2011 1420   CL 106 05/04/2012 1031   CL 104 09/24/2011 1420   CO2 28 05/04/2012 1031   CO2 26 09/24/2011 1420   BUN 16.0 05/04/2012 1031   BUN 17 09/24/2011 1420   CREATININE 0.8 05/04/2012 1031   CREATININE  0.58 09/24/2011 1420      Component Value Date/Time   CALCIUM 9.1 05/04/2012 1031   CALCIUM 9.4 09/24/2011 1420   ALKPHOS 86 05/04/2012 1031   ALKPHOS 78 09/24/2011 1420   AST 19 05/04/2012 1031   AST 18 09/24/2011 1420   ALT 13 05/04/2012 1031   ALT 16  09/24/2011 1420   BILITOT 0.55 05/04/2012 1031   BILITOT 0.4 09/24/2011 1420       Studies/Results: Mr Thoracic Spine W Wo Contrast  05/12/2012  *RADIOLOGY REPORT*  Clinical Data:  MRI THORACIC SPINE WITHOUT AND WITH CONTRAST  Technique:  Multiplanar and multiecho pulse sequences of the thoracic spine were obtained without and with intravenous contrast.  Contrast: 16mL MULTIHANCE GADOBENATE DIMEGLUMINE 529 MG/ML IV SOLN  Comparison: The bone scan dated 01/03/2012 and PET scan dated 10/02/2011  Findings: There are metastatic lesions in each thoracic vertebra except for T7. There are no pathologic fractures and there is no extension of tumor into the spinal canal or neural foramina.  Some of the metastases involve the posterior elements.  Thoracic spinal cord is normal.  Tip of the conus is at L1 and appears normal.  Paraspinal soft tissues are normal except for a small nodule in the left adrenal gland which is unchanged since a CT scan of 01/06/2009 and probably represents a tiny adenoma.  The patient does have small disc protrusions at T5-6 through T8-9 with no neural impingement.  After contrast administration the metastases slightly enhance. There is no abnormal meningeal enhancement.  IMPRESSION:  Metastatic lesions throughout the thoracic spine as described.  No pathologic fractures.  No epidural tumor.  Normal thoracic spinal cord.   Original Report Authenticated By: Francene Boyers, M.D.    Mr Lumbar Spine W Wo Contrast  05/12/2012  *RADIOLOGY REPORT*  Clinical Data: Bilateral hand and leg numbness and pain. Generalized weakness.  Metastatic breast cancer.  MRI LUMBAR SPINE WITHOUT AND WITH CONTRAST  Technique:  Multiplanar and multiecho pulse sequences of the lumbar spine were obtained without and with intravenous contrast.  Contrast: 16mL MULTIHANCE GADOBENATE DIMEGLUMINE 529 MG/ML IV SOLN  Comparison:  MRI dated 12/31/2011   Findings:  since the prior exam there has been progression of the metastatic  disease and T11, T12, and L1.  Metastatic disease in the L2 through S3 has regressed.  The patient has had interval radiation therapy to the lower lumbar spine and sacrum.  There is a small amount of tumor extending through the posterior margin of L4 into the spinal canal but not compressing the thecal sac.  This is new.  There is increased inflammation and enhancement around the right facet joint at L3-4 which is probably arthritic in origin.  IMPRESSION:   1.  Increasing metastatic disease in the upper lumbar spine in the area above the radiation therapy. 2.  Decreasing size of the metastases in the lower lumbar spine and sacrum in the region of previous radiation therapy. There is a new small area of tumor extending through the posterior aspect of L4 into the spinal canal no neural impingement. 3.  Increasing facet joint arthritis at L3-4 on the right.   Original Report Authenticated By: Francene Boyers, M.D.    Nm Pet Image Restag (ps) Skull Base To Thigh  05/22/2012  *RADIOLOGY REPORT*  Clinical Data: Subsequent treatment strategy for breast cancer.  NUCLEAR MEDICINE PET SKULL BASE TO THIGH  Fasting Blood Glucose:  90  Technique:  19.2 mCi F-18 FDG was injected intravenously. CT data was  obtained and used for attenuation correction and anatomic localization only.  (This was not acquired as a diagnostic CT examination.) Additional exam technical data entered on technologist worksheet.  Comparison:  Multiple exams, including 10/02/2011  Findings:  Neck: No hypermetabolic lymph nodes in the neck.  Chest:  Stable postoperative and postradiation therapy related findings on the right no findings of active thoracic malignancy. Biapical scarring and opacities noted with emphysema and radiation therapy related findings anteriorly along the right chest.  Abdomen/Pelvis:  Stable small left adrenal nodule, without hypermetabolic activity.  Aortoiliac atherosclerosis.  Focally high activity along the long segment of the  sigmoid colon is mostly new compared to the prior exam, and likely physiologic.  Hypodense liver lesions are not discernibly hypermetabolic.  Gallbladder surgically absent with prominent common bile duct, likely physiologically dilated.  Sigmoid diverticulosis noted. Right inguinal hernia mesh.  Skeleton:  Hypodensities correspond to known osseous metastatic disease at various levels in the thoracic and lumbar spine, including the T9 and L4 vertebral levels.  However, at this time we do not demonstrate associated hypermetabolic activity of the osseous lesions.  Possible osseous lesions in the iliac bones bilaterally.  IMPRESSION:  1.  Bony lesions corresponding to the known osseous metastatic disease are not currently hypermetabolic. 2.  Hypodense hepatic lesions may represent cysts but technically nonspecific.  No current hepatic hypermetabolic activity is observed. 3.  Stable postoperative and postradiation findings in the right thorax, without findings of active local thoracic malignancy.   Original Report Authenticated By: Gaylyn Rong, M.D.     Medications: I have reviewed the patient's current medications.  Assessment/Plan:  1. Metachronous primary bilateral ER positive (right) and PR negative (left) breast cancers metastatic to bone and chest wall. Previous treatment as outlined above. 2. Gastritis. Improved with Protonix 40 mg twice daily. 3. Back pain related to cancer and orthopedic issues. She was unable to tolerate a Duragesic patch. She is taking Norco 10/325 as needed. We again discussed methadone. She is not interested in methadone. 4. History of extensive superficial thrombophlebitis. She is maintained on therapeutic Coumadin.  Disposition-there is evidence of disease progression at the thoracic and lumbar spine. Xeloda has been discontinued. Dr. Cyndie Chime recommends beginning tamoxifen 20 mg daily and Afinitor 2.5 mg daily. We reviewed potential toxicities associated with Afinitor  including myelosuppression and mouth sores. She was given written information as well. She has taken tamoxifen in the past and is aware of the potential toxicities. We anticipate that she will begin the tamoxifen and Afinitor around 05/27/2012. We will initially obtain labs on a 2 week schedule to include a CBC, PT and chemistry panel to begin on 06/03/2012 with results faxed our office. She will return for a followup visit as scheduled on 06/29/2012. Next restaging evaluation is planned at a three-month interval. She will contact the office prior to her next visit with any problems.  Patient was seen with Dr. Cyndie Chime. Dr. Cyndie Chime reviewed the PET scan images on the computer with Ms. Veach.  Lonna Cobb ANP/GNP-BC

## 2012-05-25 ENCOUNTER — Encounter: Payer: Self-pay | Admitting: Oncology

## 2012-05-25 NOTE — Progress Notes (Signed)
Faxed request for a 2 week supply of the 2.5 mg Afinitor to [phone].

## 2012-05-28 ENCOUNTER — Other Ambulatory Visit: Payer: Self-pay | Admitting: *Deleted

## 2012-05-28 DIAGNOSIS — C50919 Malignant neoplasm of unspecified site of unspecified female breast: Secondary | ICD-10-CM

## 2012-05-28 MED ORDER — TAMOXIFEN CITRATE 20 MG PO TABS: 20.0000 mg | ORAL_TABLET | Freq: Every day | ORAL | Status: DC

## 2012-05-28 NOTE — Telephone Encounter (Signed)
Script given to pt 05/22/12 stating cbc, PT, Cmet q 2 wks.  Please fax to Dr. Cyndie Chime @ [phone], Start Wed 06/03/12 per Dr. Cyndie Chime.

## 2012-05-29 ENCOUNTER — Telehealth: Payer: Self-pay | Admitting: *Deleted

## 2012-05-29 NOTE — Telephone Encounter (Signed)
Received vm call from pt stating that she started her afinitor & tamoxifen today 05/29/12.

## 2012-06-02 ENCOUNTER — Telehealth: Payer: Self-pay | Admitting: *Deleted

## 2012-06-02 NOTE — Telephone Encounter (Signed)
Patient left message that she was to get lab work on2/5, however she did not start her medication until 1/31.  Therefore, she is not going to get lab work until 2/10 and then they will fax it to [place].

## 2012-06-15 ENCOUNTER — Other Ambulatory Visit: Payer: Self-pay | Admitting: Nurse Practitioner

## 2012-06-15 ENCOUNTER — Telehealth: Payer: Self-pay | Admitting: *Deleted

## 2012-06-15 DIAGNOSIS — C50919 Malignant neoplasm of unspecified site of unspecified female breast: Secondary | ICD-10-CM

## 2012-06-15 DIAGNOSIS — G893 Neoplasm related pain (acute) (chronic): Secondary | ICD-10-CM

## 2012-06-15 DIAGNOSIS — C7952 Secondary malignant neoplasm of bone marrow: Secondary | ICD-10-CM

## 2012-06-15 NOTE — Telephone Encounter (Signed)
Patient called very upset that she has not gotten her "cancer medicine".   Discussed with Dr. Cyndie Chime.  Discussed with Thelma Barge.  [name]. will have Dr. Cyndie Chime complete paper work and then she will fax (for increased dose to 5mg ).  Pt. States she has not had any side effects.  Let her know that we did not get any lab work from 06/08/12 from her PCP.  She  Will call them again and have them fax labs to [place]

## 2012-06-16 ENCOUNTER — Other Ambulatory Visit: Payer: Self-pay | Admitting: *Deleted

## 2012-06-17 ENCOUNTER — Telehealth: Payer: Self-pay | Admitting: *Deleted

## 2012-06-17 NOTE — Telephone Encounter (Signed)
Notified pt per [facility] that form for 5 mg afinitor was faxed yest to receive 14 d trial.  Refaxed today by [facility] & this nurse will f/u with a ph call to make sure this was received.  Encouraged pt to call when she has a weeks worth left so we can order next escalated dose.  She states that the med came from [place] last time.

## 2012-06-25 ENCOUNTER — Telehealth: Payer: Self-pay | Admitting: *Deleted

## 2012-06-25 NOTE — Telephone Encounter (Signed)
Patient has completed her first week trial of Afinitor 5 mg.  Called with side effects of "sick stomach, diarrhea, Headache and dizziness every once in a while.  Will run out on Thursday 07-02-2012."  Medicine dose needs to be increased and ordered from [place] per [facility].  Will notify providers.  Jasmine December leaves work at 3:30pm and can be reached at home at 4:30 pm [phone]).  Next f/u visit is Monday, 06-29-2012.

## 2012-06-26 ENCOUNTER — Telehealth: Payer: Self-pay | Admitting: *Deleted

## 2012-06-26 NOTE — Telephone Encounter (Signed)
Spoke with pt. Re :side effects of affinitor.  She has had some nausea (vomited x1), some diarrhea, headache and dizziness.  It has not been that bad and she states that it is manageable and she would like to increase the dose.  She will see Dr. Cyndie Chime on Monday 3-3.  She needs to receive the next dose by 3-6 as she is going out of town on 3-8.  Discussed with Dr. Cyndie Chime.  Go ahead and order next dose increase to affinitor 7.5mg .  Order faxed and confirmation recieived.  Will not change dose in med list until after pt. Has seen Dr. Cyndie Chime on 06-29-12, as pt. Is current taking the 5mg  dose.

## 2012-06-29 ENCOUNTER — Encounter: Payer: Self-pay | Admitting: Oncology

## 2012-06-29 ENCOUNTER — Ambulatory Visit (HOSPITAL_BASED_OUTPATIENT_CLINIC_OR_DEPARTMENT_OTHER): Payer: Medicare PPO | Admitting: Oncology

## 2012-06-29 ENCOUNTER — Other Ambulatory Visit: Payer: Self-pay | Admitting: *Deleted

## 2012-06-29 ENCOUNTER — Other Ambulatory Visit (HOSPITAL_BASED_OUTPATIENT_CLINIC_OR_DEPARTMENT_OTHER): Payer: Medicare PPO | Admitting: Lab

## 2012-06-29 ENCOUNTER — Telehealth: Payer: Self-pay | Admitting: Oncology

## 2012-06-29 VITALS — BP 136/63 | HR 65 | Temp 98.4°F | Wt 172.3 lb

## 2012-06-29 DIAGNOSIS — G893 Neoplasm related pain (acute) (chronic): Secondary | ICD-10-CM

## 2012-06-29 DIAGNOSIS — C7951 Secondary malignant neoplasm of bone: Secondary | ICD-10-CM

## 2012-06-29 DIAGNOSIS — C50919 Malignant neoplasm of unspecified site of unspecified female breast: Secondary | ICD-10-CM

## 2012-06-29 DIAGNOSIS — Z8672 Personal history of thrombophlebitis: Secondary | ICD-10-CM

## 2012-06-29 LAB — COMPREHENSIVE METABOLIC PANEL (CC13)
Albumin: 3.2 g/dL — ABNORMAL LOW (ref 3.5–5.0)
BUN: 13.1 mg/dL (ref 7.0–26.0)
CO2: 28 mEq/L (ref 22–29)
Calcium: 8.6 mg/dL (ref 8.4–10.4)
Glucose: 103 mg/dl — ABNORMAL HIGH (ref 70–99)
Potassium: 3.5 mEq/L (ref 3.5–5.1)
Sodium: 144 mEq/L (ref 136–145)
Total Protein: 6.7 g/dL (ref 6.4–8.3)

## 2012-06-29 LAB — CBC WITH DIFFERENTIAL/PLATELET
Basophils Absolute: 0 10*3/uL (ref 0.0–0.1)
Eosinophils Absolute: 0.1 10*3/uL (ref 0.0–0.5)
HCT: 39.2 % (ref 34.8–46.6)
HGB: 13.4 g/dL (ref 11.6–15.9)
MCH: 30.5 pg (ref 25.1–34.0)
MCV: 89.4 fL (ref 79.5–101.0)
MONO%: 14 % (ref 0.0–14.0)
NEUT#: 2.1 10*3/uL (ref 1.5–6.5)
NEUT%: 67.2 % (ref 38.4–76.8)
RDW: 15.2 % — ABNORMAL HIGH (ref 11.2–14.5)
lymph#: 0.4 10*3/uL — ABNORMAL LOW (ref 0.9–3.3)

## 2012-06-29 MED ORDER — MOMETASONE FUROATE 0.1 % EX SOLN: Freq: Every day | CUTANEOUS | Status: DC | PRN

## 2012-06-29 MED ORDER — HYDROCODONE-ACETAMINOPHEN 10-325 MG PO TABS: ORAL_TABLET | ORAL | Status: DC

## 2012-06-29 NOTE — Patient Instructions (Signed)
Increase affinitor to 7.5 mg daily Next and final dose level will be 10 mg - I would like to keep you at the 7.5 mg dose for 4 weeks before increasing to 10 mg Visit with Dr Reece Agar 4/14 at 10 AM; lab before visit

## 2012-06-29 NOTE — Progress Notes (Signed)
Dr. Cyndie Chime bought patient by. She had bill and was billed twice?? I made copy of bill and will give to Lenise. Patient wants to call once corrected. She said was billed for Dr and Misty Mascaro.  Also having issues with insurance. I advised she has to call Medicare. I put note for Lenise to check to see if help with Afinitor???

## 2012-06-29 NOTE — Progress Notes (Addendum)
FAXED RX FOR AFINITOR 7.5 MG TO [facility] [phone].  07/01/12 [employer] approved her Afinitor 7.5 mg until 12/28/12.

## 2012-06-29 NOTE — Progress Notes (Signed)
Hematology and Oncology Follow Up Visit  [name] [id number] Jun 07, 1945 67 y.o. 06/29/2012 3:22 PM   Principle Diagnosis: Encounter Diagnosis  Name Primary?  . Breast cancer metastasized to bone, unspecified laterality Yes     Interim History:   Short interim followup visit for this 67 year old woman with metachronous Meri bilateral breast cancers both the ER-positive and ER negative. Metastasis to bone the ER-positive cancer prior to the diagnosis of the ER-negative cancer in the contralateral breast and concomitant with a new ER positive cancer in the right breast. Adjuvant chemotherapy and hormonal therapy following initial cancer. Additional hormonal therapy and local radiation following second and third primaries. Initial excellent and durable response to monthly Faslodex hormonal injections. Additional short-term response to Xeloda oral chemotherapy. Most recently put on a combination of tamoxifen plus affinitor for further progression in bone. Please see recent notes for full details.  I have been titrating her off the affinitor by 2.5 mg increments every 2 weeks starting at initial dose of 2.5 mg last month. She is tolerated dose escalations of 25 mg and is about to start on 7.5 mg daily dose this Friday, March 7. She is getting some mild stomatitis. No skin rash.  She continues to have multiple areas of bone pain. She is using hydrocodone with acetaminophen 10/325 6-8 tablets daily. She has not tolerated other narcotics including oxycodone, OxyContin, Transderm fentanyl.  Restaging PET scan done 05/22/2012 which I personally reviewed with her today does not show any hypermetabolic activity over bone lesions. Some of these were in previously irradiated fields. Not clear at many of these lesions outside the radiation ports and are sclerotic. No other obvious areas of abnormal uptake in soft tissues or liver.  Her GI symptoms have subsided when she went back on a proton pump  inhibitor on a regular basis and on a twice daily dose. She has been able to reduce the dose to once a day at present.  She continues to have orthopedic problems with her feet which cause a lot of discomfort.  She still working 45 hours a week despite a persistent bone pain.  Medications: reviewed  Allergies:  Allergies  Allergen Reactions  . Percodan (Oxycodone-Aspirin) Nausea Only  . Codeine Nausea Only    Review of Systems: Constitutional: Increasing fatigue, decreased appetite   Respiratory: No cough or dyspnea Cardiovascular:  No chest pain or palpitations Gastrointestinal: See above Genito-Urinary: Not questioned  Musculoskeletal: See above Neurologic: No headache or change in vision Skin: No rash or ecchymosis Remaining ROS negative.  Physical Exam: Blood pressure 136/63, pulse 65, temperature 98.4 F (36.9 C), temperature source Oral, weight 172 lb 4.8 oz (78.155 kg). Wt Readings from Last 3 Encounters:  06/29/12 172 lb 4.8 oz (78.155 kg)  05/22/12 172 lb 11.2 oz (78.336 kg)  05/04/12 173 lb 1.6 oz (78.518 kg)     General appearance: Thin Caucasian woman HENNT: Pharynx no erythema or exudate Lymph nodes: No adenopathy Breasts: Bilateral mastectomy. No active chest lesions. Lungs: Clear to auscultation resonant to percussion Heart: Regular rhythm no murmur Abdomen: Soft, nontender, no mass, no organomegaly Extremities: No edema, no calf tenderness Vascular: No cyanosis Neurologic: Mental status intact, motor strength 5 over 5, reflexes 1+ symmetric Skin: No rash or ecchymosis  Lab Results: Lab Results  Component Value Date   WBC 3.1* 06/29/2012   HGB 13.4 06/29/2012   HCT 39.2 06/29/2012   MCV 89.4 06/29/2012   PLT 208 06/29/2012     Chemistry  Component Value Date/Time   NA 144 06/29/2012 0920   NA 141 09/24/2011 1420   K 3.5 06/29/2012 0920   K 4.0 09/24/2011 1420   CL 108* 06/29/2012 0920   CL 104 09/24/2011 1420   CO2 28 06/29/2012 0920   CO2 26 09/24/2011  1420   BUN 13.1 06/29/2012 0920   BUN 17 09/24/2011 1420   CREATININE 0.7 06/29/2012 0920   CREATININE 0.58 09/24/2011 1420      Component Value Date/Time   CALCIUM 8.6 06/29/2012 0920   CALCIUM 9.4 09/24/2011 1420   ALKPHOS 83 06/29/2012 0920   ALKPHOS 78 09/24/2011 1420   AST 22 06/29/2012 0920   AST 18 09/24/2011 1420   ALT 17 06/29/2012 0920   ALT 16 09/24/2011 1420   BILITOT 0.32 06/29/2012 0920   BILITOT 0.4 09/24/2011 1420       Radiological Studies: See discussion above   Impression and Plan: #1. Metachronous primary bilateral ER-positive (right) and PR negative ( left) breast cancers now metastatic to bone and chest wall.   Initial stage II multiple node positive ER positive cancer right breast diagnosed December 2006 treated with lumpectomy radiation and chemotherapy. Progression to bone while on tamoxifen September 2011.  Second primary presumed left breast detected by abnormal PET activity in the left axilla November 2011. While under evaluation for this abnormality she was found to have a third primary in the right breast. Bilateral mastectomies done 06/28/2010. No obvious primary found in the left breast tissue but the node was ER negative consistent with a second primary. The 2.2 cm invasive cancer in the right breast was ER positive PR negative HER-2 negative similar to her initial right breast cancer.  She received initial radiation to her left chest wall and left supraclavicular region. She developed abnormal erythema medial aspect right mastectomy scar with biopsies consistent with cutaneous metastases as noted above. She received electron beam radiation to this area with good result.  Her bone disease was very well controlled on monthly Faslodex injections started in September 2011 until August 2013. She developed increased pain in her back and was found to have progression. She completed palliative radiation to the lumbosacral spine 3500 cGy in 14 fractions 9/18 through 10/7. She was  started on single agent Xeloda chemotherapy in November 2013. Recent evaluation in January 2014 due to increasing bone pain including MRI and PET scan showed progressive disease in bone. She is now  on a trial of combination of tamoxifen plus Affinitor. Too early to assess response. I will continue to titrate the dose. My guess is that we will not be able to go above 7.5 mg. We are already seeing myelosuppression at the 5 mg level and early stomatitis. I will continue the 7.5 mg dose for one month before considering dose escalation to 10 mg which would be the maximum dose if tolerated.  #2. Ongoing gastritis.  Improved on twice a day proton pump inhibitor which she has now titrated down to once a day  #3. History of extensive superficial thrombophlebitis.  In view of her metastatic cancer and need for chemotherapy I started her on therapeutic Coumadin which she remains on at present.   #4. Multifactorial pain from cancer and orthopedic problems.    CC:.    Levert Feinstein, MD 3/3/20143:22 PM

## 2012-06-29 NOTE — Progress Notes (Signed)
Pt had concerns about a bill she received.  She thought she was doubled billed for 05/04/12 dos, one for Dr. Cyndie Chime and one for Lonna Cobb.  I reviewed the billing and discovered she was not double billed.  She saw Dr. Cyndie Chime on 05/04/12 and Lonna Cobb on 05/22/12.  There were also concerns about a $45 copay that was made on 05/04/12 that hasn't been applied yet.  I talked to [facility] with the billing department and she will research that and get in touch with the pt this afternoon concerning what dos the payment was posted to.

## 2012-07-15 ENCOUNTER — Other Ambulatory Visit: Payer: Self-pay | Admitting: *Deleted

## 2012-07-15 DIAGNOSIS — C7951 Secondary malignant neoplasm of bone: Secondary | ICD-10-CM

## 2012-07-15 MED ORDER — EVEROLIMUS 7.5 MG PO TABS: 7.5000 mg | ORAL_TABLET | Freq: Every day | ORAL | Status: DC

## 2012-07-15 NOTE — Telephone Encounter (Signed)
Checked with [facility] about pt's script for afinitor 7.5 mg.  She reports script was sent to [facility]

## 2012-07-15 NOTE — Telephone Encounter (Signed)
[facility] reports that they have script & is trying to reach [name] to try to get copay assistance.  She states [name]'s copay will be $ 1742.31.  Reached [name] on cell # & asked her to call [facility] so they can work on copay assistance.  The [name] is planning to be out of town & states that she wanted script sent as usual but she will have her friend @ [employer], [address] [place], [place] [address] to intercept package for her.  She reports that this is a [employer] officer.  The [name] has received scripts for free 5 mg & 7.5 mg & can no longer get free 14 day trial drug through [employer].  She has [employer].   The [name] is to call us back if she needs further help on this script.

## 2012-07-20 ENCOUNTER — Other Ambulatory Visit: Payer: Self-pay | Admitting: *Deleted

## 2012-07-20 DIAGNOSIS — C7952 Secondary malignant neoplasm of bone marrow: Secondary | ICD-10-CM

## 2012-07-20 DIAGNOSIS — C50919 Malignant neoplasm of unspecified site of unspecified female breast: Secondary | ICD-10-CM

## 2012-07-20 MED ORDER — EVEROLIMUS 7.5 MG PO TABS: 7.5000 mg | ORAL_TABLET | Freq: Every day | ORAL | Status: DC

## 2012-07-20 NOTE — Telephone Encounter (Signed)
Pt called this am & states that she was told that her afinitor would be paid for by [employer] & to take rx to [facility].  Script called to [facility].

## 2012-07-21 ENCOUNTER — Telehealth: Payer: Self-pay | Admitting: Dietician

## 2012-07-30 ENCOUNTER — Telehealth: Payer: Self-pay | Admitting: *Deleted

## 2012-07-30 NOTE — Telephone Encounter (Signed)
Received call from pt stating that she received a shipment of afinitor of 14 pills from [facility] but her [employer] insurance/[employer] called her & said she was approved & they would be calling her q 30 days for refill through a grant.  She is confused & stressed over this b/c [employer] doesn't know anything about [facility].  Who is she supposed to be getting her med from?  Message left with [facility] to help with this.

## 2012-08-03 ENCOUNTER — Other Ambulatory Visit: Payer: Self-pay | Admitting: Oncology

## 2012-08-03 DIAGNOSIS — C7951 Secondary malignant neoplasm of bone: Secondary | ICD-10-CM

## 2012-08-03 DIAGNOSIS — C7952 Secondary malignant neoplasm of bone marrow: Secondary | ICD-10-CM

## 2012-08-03 DIAGNOSIS — C50919 Malignant neoplasm of unspecified site of unspecified female breast: Secondary | ICD-10-CM

## 2012-08-06 ENCOUNTER — Encounter: Payer: Self-pay | Admitting: Oncology

## 2012-08-10 ENCOUNTER — Encounter: Payer: Self-pay | Admitting: Oncology

## 2012-08-10 ENCOUNTER — Ambulatory Visit (HOSPITAL_BASED_OUTPATIENT_CLINIC_OR_DEPARTMENT_OTHER): Payer: Medicare PPO | Admitting: Oncology

## 2012-08-10 ENCOUNTER — Telehealth: Payer: Self-pay | Admitting: Oncology

## 2012-08-10 ENCOUNTER — Ambulatory Visit (HOSPITAL_COMMUNITY)
Admission: RE | Admit: 2012-08-10 | Discharge: 2012-08-10 | Disposition: A | Discharge status: Home / Self Care | Payer: Medicare PPO | Source: Ambulatory Visit | Attending: Oncology | Admitting: Oncology

## 2012-08-10 ENCOUNTER — Other Ambulatory Visit (HOSPITAL_BASED_OUTPATIENT_CLINIC_OR_DEPARTMENT_OTHER): Payer: Medicare PPO | Admitting: Lab

## 2012-08-10 VITALS — BP 128/65 | HR 67 | Temp 98.2°F | Resp 18 | Ht 64.0 in | Wt 166.1 lb

## 2012-08-10 DIAGNOSIS — C7952 Secondary malignant neoplasm of bone marrow: Secondary | ICD-10-CM

## 2012-08-10 DIAGNOSIS — M25471 Effusion, right ankle: Secondary | ICD-10-CM

## 2012-08-10 DIAGNOSIS — C50919 Malignant neoplasm of unspecified site of unspecified female breast: Secondary | ICD-10-CM | POA: Insufficient documentation

## 2012-08-10 DIAGNOSIS — Z7901 Long term (current) use of anticoagulants: Secondary | ICD-10-CM

## 2012-08-10 DIAGNOSIS — S8251XA Displaced fracture of medial malleolus of right tibia, initial encounter for closed fracture: Secondary | ICD-10-CM

## 2012-08-10 DIAGNOSIS — C7951 Secondary malignant neoplasm of bone: Secondary | ICD-10-CM

## 2012-08-10 DIAGNOSIS — S8253XA Displaced fracture of medial malleolus of unspecified tibia, initial encounter for closed fracture: Secondary | ICD-10-CM | POA: Insufficient documentation

## 2012-08-10 DIAGNOSIS — X58XXXA Exposure to other specified factors, initial encounter: Secondary | ICD-10-CM | POA: Insufficient documentation

## 2012-08-10 DIAGNOSIS — G893 Neoplasm related pain (acute) (chronic): Secondary | ICD-10-CM

## 2012-08-10 HISTORY — DX: Effusion, right ankle: M25.471

## 2012-08-10 HISTORY — DX: Displaced fracture of medial malleolus of right tibia, initial encounter for closed fracture: S82.51XA

## 2012-08-10 LAB — COMPREHENSIVE METABOLIC PANEL (CC13)
AST: 29 U/L (ref 5–34)
Alkaline Phosphatase: 71 U/L (ref 40–150)
Glucose: 114 mg/dl — ABNORMAL HIGH (ref 70–99)
Sodium: 144 mEq/L (ref 136–145)
Total Bilirubin: 0.36 mg/dL (ref 0.20–1.20)
Total Protein: 6.6 g/dL (ref 6.4–8.3)

## 2012-08-10 LAB — CBC WITH DIFFERENTIAL/PLATELET
Eosinophils Absolute: 0.1 10*3/uL (ref 0.0–0.5)
HGB: 12.7 g/dL (ref 11.6–15.9)
MONO#: 0.4 10*3/uL (ref 0.1–0.9)
MONO%: 9.1 % (ref 0.0–14.0)
NEUT#: 3.1 10*3/uL (ref 1.5–6.5)
RBC: 4.48 10*6/uL (ref 3.70–5.45)
RDW: 15.9 % — ABNORMAL HIGH (ref 11.2–14.5)
WBC: 3.9 10*3/uL (ref 3.9–10.3)
lymph#: 0.3 10*3/uL — ABNORMAL LOW (ref 0.9–3.3)

## 2012-08-10 LAB — PROTIME-INR
INR: 2.1 (ref 2.00–3.50)
Protime: 25.2 Seconds — ABNORMAL HIGH (ref 10.6–13.4)

## 2012-08-10 LAB — CANCER ANTIGEN 27.29: CA 27.29: 49 U/mL — ABNORMAL HIGH (ref 0–39)

## 2012-08-10 NOTE — Telephone Encounter (Signed)
lvm for pt regarding to 5.5. and 5.12.14 appt....Marland KitchenMarland Kitchen

## 2012-08-10 NOTE — Progress Notes (Signed)
Hematology and Oncology Follow Up Visit  [name] [id number] 05/10/1945 67 y.o. 08/10/2012 6:18 PM   Principle Diagnosis: Encounter Diagnoses  Name Primary?  . Breast cancer metastasized to bone, unspecified laterality   . 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 I have been escalating her dose from initial 2.5 mg daily x14 days, to 5 mg daily x14 days, 7.5 mg daily x14 days. Overall she has had only minor toxicity from the drug including intermittent nausea, dysgeusia, dry mouth, constipation, and brittle fingernails. She is now starting to lose weight.  She reduced her proton pump inhibitor to once a day and now has recurrent epigastric pain.  She has been having increasing pain and swelling in her right ankle over the last few weeks.  Medications: reviewed  Allergies:  Allergies  Allergen Reactions  . Percodan (Oxycodone-Aspirin) Nausea Only  . Codeine Nausea Only    Review of Systems: Constitutional:   Chronic fatigue, now with anorexia and weight loss Respiratory: No cough or dyspnea Cardiovascular:  No chest pain or palpitations Gastrointestinal: See above Genito-Urinary: She has been having some bloody vaginal discharge Musculoskeletal: Back pain stable as long as she takes her Norco. No new areas of bone pain Neurologic: No headache or change in vision Skin: No rash or ecchymosis Miscellaneous: Her Coumadin dose has been decreased to 4 mg daily since she started the eighth and it tore. Remaining ROS negative.  Physical Exam: Blood pressure 128/65, pulse 67, temperature 98.2 F (36.8 C), temperature source Oral,  resp. rate 18, height 5\' 4"  (1.626 m), weight 166 lb 1.6 oz (75.342 kg). Wt Readings from Last 3 Encounters:  08/10/12 166 lb 1.6 oz (75.342 kg)  06/29/12 172 lb 4.8 oz (78.155 kg)  05/22/12 172 lb 11.2 oz (78.336 kg)     General appearance: Adequately nourished Caucasian woman. Weight down 6 pounds compared with arch there exam HENNT: Pharynx no erythema, exudate, or ulcer. No angular stomatitis Lymph nodes: No cervical, supraclavicular, or axillary adenopathy  Breasts: Bilateral mastectomies. No chest wall lesions Lungs: Clear to auscultation resonant to percussion Heart: Regular rhythm no murmur or gallop Abdomen: Soft, chronically tender in the epigastric region, no palpable mass or organomegaly Extremities: 1+ right ankle edema, pain on palpation over the medial malleolus. No calf tenderness. Varicose veins both legs. No pain on movement of the ankle joint. Vascular: No cyanosis. Dorsalis pedis pulse 2+ on the right Neurologic: PERRLA , motor strength 5 over 5, reflexes 1+ symmetric Skin: No rash or ecchymosis  Lab Results: Lab Results  Component Value Date   WBC 3.9 08/10/2012   HGB 12.7 08/10/2012   HCT 37.6 08/10/2012   MCV 83.9 08/10/2012   PLT 189 08/10/2012     Chemistry      Component Value Date/Time   NA 144 08/10/2012 0912   NA 141 09/24/2011 1420   K 3.6 08/10/2012 0912   K 4.0 09/24/2011 1420   CL 110* 08/10/2012 0912   CL 104 09/24/2011 1420   CO2 25 08/10/2012 0912   CO2 26 09/24/2011 1420   BUN 12.9 08/10/2012 0912   BUN 17 09/24/2011 1420   CREATININE  0.7 08/10/2012 0912   CREATININE 0.58 09/24/2011 1420      Component Value Date/Time   CALCIUM 8.6 08/10/2012 0912   CALCIUM 9.4 09/24/2011 1420   ALKPHOS 71 08/10/2012 0912   ALKPHOS 78 09/24/2011 1420   AST 29 08/10/2012 0912   AST 18 09/24/2011 1420   ALT 22 08/10/2012 0912   ALT 16 09/24/2011 1420   BILITOT 0.36 08/10/2012 0912   BILITOT 0.4 09/24/2011 1420     INR 2.1 on Coumadin 4 mg daily  Radiological  Studies: Dg Ankle Complete Right  08/10/2012  *RADIOLOGY REPORT*  Clinical Data: Known metastatic breast carcinoma, unexplained right ankle pain and swelling medially for several weeks  RIGHT ANKLE - COMPLETE 3+ VIEW  Comparison: None.  Findings: There is a nondisplaced fracture of the medial malleolus of the distal right tibia.  The ankle joint appears normal and alignment is normal.  A moderate sized plantar calcaneal degenerative spur is noted.  IMPRESSION: Nondisplaced subacute fracture of the medial malleolus.   Original Report Authenticated By: Dwyane Dee, M.D.     Impression and Plan: #1. Metachronous primary bilateral ER-positive (right) and PR negative ( left) breast cancers now metastatic to bone and chest wall.  Initial stage II multiple node positive ER positive cancer right breast diagnosed December 2006 treated with lumpectomy radiation and chemotherapy. Progression to bone while on tamoxifen September 2011.  Second primary presumed left breast detected by abnormal PET activity in the left axilla November 2011. While under evaluation for this abnormality she was found to have a third primary in the right breast. Bilateral mastectomies done 06/28/2010. No obvious primary found in the left breast tissue but the node was ER negative consistent with a second primary. The 2.2 cm invasive cancer in the right breast was ER positive PR negative HER-2 negative similar to her initial right breast cancer.  She received initial radiation to her left chest wall and left supraclavicular region. She developed abnormal erythema medial aspect right mastectomy scar with biopsies consistent with cutaneous metastases as noted above. She received electron beam radiation to this area with good result.  Her bone disease was very well controlled on monthly Faslodex injections started in September 2011 until August 2013. She developed increased pain in her back and was found to have progression. She completed  palliative radiation to the lumbosacral spine 3500 cGy in 14 fractions 9/18 through 10/7. She was started on single agent Xeloda chemotherapy in November 2013. Recent evaluation in January 2014 due to increasing bone pain including MRI and PET scan showed progressive disease in bone.  She is now on a trial of combination of tamoxifen plus Affinitor. Too early to assess response.  I will continue to titrate the dose. I will keep her at the 7.5 mg dose for now. Escalate to 10 mg if possible.  #2. Chronic gastritis Improved on twice a day proton pump inhibitor which she has now titrated down to once a day with recurrence of her symptoms. I told her to go back on the twice daily dosing.  #3. History of extensive superficial thrombophlebitis.  In view of her metastatic cancer and need for chemotherapy I started her on therapeutic Coumadin which she remains on at present. Current dose 4 mg daily INR today 2.1  #4. Multifactorial pain from cancer and orthopedic problems. Now with a acute/subacute fracture of the right medial malleolus. I will contact her to get back in touch with her orthopedic surgeon to see if ankle brace or  cast is indicated.  #5. Status post right rotator cuff repair     CC:. Dr. Quintin Alto ; Dr. Rinaldo Ratel   Levert Feinstein, MD 4/14/20146:18 PM

## 2012-08-11 ENCOUNTER — Telehealth: Payer: Self-pay | Admitting: *Deleted

## 2012-08-11 NOTE — Telephone Encounter (Signed)
Patient called asking for help getting her xray from yesterday @ [facility] to the orthopedic - Dr. Chaney Malling [phone]) at [facility] for her appt. On 08/17/12.   Called [facility] file room and spoke with Lora Havens- she will get it to Dr. Chaney Malling.    Called Bob back and let her know that it was being worked on and she appreciated it.

## 2012-08-12 ENCOUNTER — Encounter: Payer: Self-pay | Admitting: Oncology

## 2012-08-12 NOTE — Progress Notes (Signed)
Faxed affinitor 7.5mg  prescription to [facility] @ [phone].

## 2012-08-13 ENCOUNTER — Other Ambulatory Visit: Payer: Self-pay | Admitting: *Deleted

## 2012-08-13 ENCOUNTER — Telehealth: Payer: Self-pay | Admitting: *Deleted

## 2012-08-13 DIAGNOSIS — C50919 Malignant neoplasm of unspecified site of unspecified female breast: Secondary | ICD-10-CM

## 2012-08-13 DIAGNOSIS — C7951 Secondary malignant neoplasm of bone: Secondary | ICD-10-CM

## 2012-08-13 MED ORDER — EVEROLIMUS 7.5 MG PO TABS: 7.5000 mg | ORAL_TABLET | Freq: Every day | ORAL | Status: DC

## 2012-08-13 NOTE — Telephone Encounter (Signed)
Danielle from [employer] called.  Script is written for affinitor 7.5mg  #30.   They come in a #28day foil pack.  Let her know it is fine to change amount to #28 for one month supply.

## 2012-08-24 ENCOUNTER — Telehealth: Payer: Self-pay | Admitting: Dietician

## 2012-08-28 ENCOUNTER — Telehealth: Payer: Self-pay | Admitting: *Deleted

## 2012-08-28 NOTE — Telephone Encounter (Signed)
Received call from [facility] rad stating PET scheduled for Monday will have to cancelled since not approved yet & per Lilyan Punt "waiting for clinical review".  Notified pt that this will be cancelled until taken care of.  She reports that she is for labs also & doesn't want to come for just labs.  Informed we could r/s labs once we have approval for PET.  Asked Lilyan Punt to let us know disposition of this.

## 2012-08-31 ENCOUNTER — Other Ambulatory Visit: Payer: Medicare PPO | Admitting: Lab

## 2012-08-31 ENCOUNTER — Other Ambulatory Visit: Payer: Self-pay | Admitting: *Deleted

## 2012-08-31 ENCOUNTER — Encounter (HOSPITAL_COMMUNITY): Payer: Medicare PPO

## 2012-09-01 ENCOUNTER — Telehealth: Payer: Self-pay | Admitting: Oncology

## 2012-09-01 NOTE — Telephone Encounter (Signed)
Called pt and left message regarding lab on 09/02/12

## 2012-09-02 ENCOUNTER — Ambulatory Visit: Payer: Medicare PPO | Admitting: Oncology

## 2012-09-02 ENCOUNTER — Encounter (HOSPITAL_COMMUNITY)
Admission: RE | Admit: 2012-09-02 | Discharge: 2012-09-02 | Disposition: A | Discharge status: Home / Self Care | Payer: Medicare PPO | Source: Ambulatory Visit | Attending: Oncology | Admitting: Oncology

## 2012-09-02 ENCOUNTER — Other Ambulatory Visit (HOSPITAL_BASED_OUTPATIENT_CLINIC_OR_DEPARTMENT_OTHER): Payer: Medicare PPO | Admitting: Lab

## 2012-09-02 DIAGNOSIS — C7951 Secondary malignant neoplasm of bone: Secondary | ICD-10-CM | POA: Insufficient documentation

## 2012-09-02 DIAGNOSIS — Z9010 Acquired absence of unspecified breast and nipple: Secondary | ICD-10-CM | POA: Insufficient documentation

## 2012-09-02 DIAGNOSIS — C50919 Malignant neoplasm of unspecified site of unspecified female breast: Secondary | ICD-10-CM | POA: Insufficient documentation

## 2012-09-02 DIAGNOSIS — E279 Disorder of adrenal gland, unspecified: Secondary | ICD-10-CM | POA: Insufficient documentation

## 2012-09-02 DIAGNOSIS — C7952 Secondary malignant neoplasm of bone marrow: Secondary | ICD-10-CM

## 2012-09-02 DIAGNOSIS — Z7901 Long term (current) use of anticoagulants: Secondary | ICD-10-CM

## 2012-09-02 LAB — CBC WITH DIFFERENTIAL/PLATELET
BASO%: 0.5 % (ref 0.0–2.0)
Eosinophils Absolute: 0.1 10*3/uL (ref 0.0–0.5)
HCT: 35.4 % (ref 34.8–46.6)
LYMPH%: 8.5 % — ABNORMAL LOW (ref 14.0–49.7)
MCHC: 34.1 g/dL (ref 31.5–36.0)
MONO#: 0.4 10*3/uL (ref 0.1–0.9)
MONO%: 12.4 % (ref 0.0–14.0)
NEUT#: 2.7 10*3/uL (ref 1.5–6.5)
RBC: 4.43 10*6/uL (ref 3.70–5.45)
RDW: 15.2 % — ABNORMAL HIGH (ref 11.2–14.5)
WBC: 3.5 10*3/uL — ABNORMAL LOW (ref 3.9–10.3)
lymph#: 0.3 10*3/uL — ABNORMAL LOW (ref 0.9–3.3)

## 2012-09-02 LAB — COMPREHENSIVE METABOLIC PANEL (CC13)
AST: 27 U/L (ref 5–34)
Alkaline Phosphatase: 73 U/L (ref 40–150)
Glucose: 105 mg/dl — ABNORMAL HIGH (ref 70–99)
Sodium: 144 mEq/L (ref 136–145)
Total Bilirubin: 0.29 mg/dL (ref 0.20–1.20)
Total Protein: 6.3 g/dL — ABNORMAL LOW (ref 6.4–8.3)

## 2012-09-02 LAB — PROTIME-INR
INR: 3.6 — ABNORMAL HIGH (ref 2.00–3.50)
Protime: 43.2 Seconds — ABNORMAL HIGH (ref 10.6–13.4)

## 2012-09-02 MED ORDER — FLUDEOXYGLUCOSE F - 18 (FDG) INJECTION
19.3000 | Freq: Once | INTRAVENOUS | Status: AC | PRN
  Administered 2012-09-02: 19.3 via INTRAVENOUS

## 2012-09-07 ENCOUNTER — Ambulatory Visit (HOSPITAL_COMMUNITY)
Admission: RE | Admit: 2012-09-07 | Discharge: 2012-09-07 | Disposition: A | Discharge status: Home / Self Care | Payer: Medicare PPO | Source: Ambulatory Visit | Attending: Oncology | Admitting: Oncology

## 2012-09-07 ENCOUNTER — Ambulatory Visit (HOSPITAL_BASED_OUTPATIENT_CLINIC_OR_DEPARTMENT_OTHER): Payer: Medicare PPO | Admitting: Oncology

## 2012-09-07 ENCOUNTER — Telehealth: Payer: Self-pay | Admitting: Oncology

## 2012-09-07 VITALS — BP 140/74 | HR 69 | Temp 98.0°F | Resp 18 | Ht 64.0 in | Wt 164.4 lb

## 2012-09-07 DIAGNOSIS — C7951 Secondary malignant neoplasm of bone: Secondary | ICD-10-CM | POA: Insufficient documentation

## 2012-09-07 DIAGNOSIS — C50919 Malignant neoplasm of unspecified site of unspecified female breast: Secondary | ICD-10-CM

## 2012-09-07 DIAGNOSIS — R0781 Pleurodynia: Secondary | ICD-10-CM

## 2012-09-07 DIAGNOSIS — C50419 Malignant neoplasm of upper-outer quadrant of unspecified female breast: Secondary | ICD-10-CM

## 2012-09-07 DIAGNOSIS — I808 Phlebitis and thrombophlebitis of other sites: Secondary | ICD-10-CM

## 2012-09-07 DIAGNOSIS — Z9010 Acquired absence of unspecified breast and nipple: Secondary | ICD-10-CM | POA: Insufficient documentation

## 2012-09-07 DIAGNOSIS — R079 Chest pain, unspecified: Secondary | ICD-10-CM | POA: Insufficient documentation

## 2012-09-07 NOTE — Telephone Encounter (Signed)
gv and printed ppt sched and avs for pt....schedule pt for body scan at [facility] on 5.19.14 @ 12:00pm...sent pt to radiology.Marland KitchenMarland KitchenMarland Kitchen

## 2012-09-07 NOTE — Progress Notes (Signed)
Hematology and Oncology Follow Up Visit  [name] [id number] [birth year] 67 y.o. 09/07/2012 2:23 PM   Principle Diagnosis: Encounter Diagnoses  Name Primary?  . Breast cancer metastasized to bone, unspecified laterality   . Rib pain on left side Yes     Interim History:   Followup visit for this rather complicated 67 year old woman with history of metachronous primary bilateral breast cancers now metastatic to bone. Please see recent notes for full details.  Secondary to further progression in bone Faslodex injections were discontinued and she is currently receiving a trial of tamoxifen plus Affinitor. I have slowly escalated her dose up to current dose of 7.5 mg daily with tamoxifen 20 mg daily. Overall she has tolerated the drug reasonably well except for increased fatigue and moderate myelosuppression.  At time of most recent visit here on April 14, she complained of rather acute onset of pain and swelling of her right ankle. Regular x-rays were obtained and showed a nondisplaced fracture of the right medial malleolus. Not stated in the report whether this was a pathologic fracture or not. I obtained a PET scan in anticipation of today's visit and there is no hypermetabolic activity. Looking back, we have never seen PET activity for her bone lesions on prior studies and I suspect that these are sclerotic and not blastic lesions. MRI of the spine has been the most sensitive indicator of her bone disease. Nuclear medicine bone scan also shows the lesions.  Since her visit here in April, she has now developed new pain in the lower, anterolateral, right, ribs.  Medications: reviewed  Allergies:  Allergies  Allergen Reactions  . Percodan (Oxycodone-Aspirin) Nausea Only  . Codeine Nausea Only    Review of Systems: Constitutional:   Chronic fatigue Respiratory: No cough or dyspnea Cardiovascular:  No chest pain or palpitations Gastrointestinal: She is experiencing dry mouth and  dysphagia including dysphagia for liquids. Chronic gastritis has subsided. She went back to once daily proton pump inhibitor. Genito-Urinary: Not question Musculoskeletal: See above Neurologic: No headache or change in vision Skin: No rash or ecchymosis Remaining ROS negative.  Physical Exam: Blood pressure 140/74, pulse 69, temperature 98 F (36.7 C), temperature source Oral, resp. rate 18, height 5\' 4"  (1.626 m), weight 164 lb 6.4 oz (74.571 kg). Wt Readings from Last 3 Encounters:  09/07/12 164 lb 6.4 oz (74.571 kg)  08/10/12 166 lb 1.6 oz (75.342 kg)  06/29/12 172 lb 4.8 oz (78.155 kg)     General appearance: Thin Caucasian woman HENNT: Pharynx no erythema or exudate Lymph nodes: No cervical, supraclavicular, or axillary adenopathy Breasts: Bilateral mastectomies. No chest wall lesions. Lungs: Clear to auscultation resonant to percussion Heart: Regular rhythm no murmur Abdomen: Soft, nontender, no mass, no organomegaly Extremities: No edema, no calf tenderness Musculoskeletal: She is tender on palpation over the lower right anterolateral ribs GU: Vascular: No cyanosis Neurologic: Motor strength 5 over 5, reflexes 1+ symmetric Skin: No rash or ecchymosis  Lab Results: Lab Results  Component Value Date   WBC 3.5* 09/02/2012   HGB 12.1 09/02/2012   HCT 35.4 09/02/2012   MCV 80.0 09/02/2012   PLT 186 09/02/2012     Chemistry      Component Value Date/Time   NA 144 09/02/2012 0814   NA 141 09/24/2011 1420   K 4.1 09/02/2012 0814   K 4.0 09/24/2011 1420   CL 110* 09/02/2012 0814   CL 104 09/24/2011 1420   CO2 25 09/02/2012 0814   CO2 26  09/24/2011 1420   BUN 17.4 09/02/2012 0814   BUN 17 09/24/2011 1420   CREATININE 0.7 09/02/2012 0814   CREATININE 0.58 09/24/2011 1420      Component Value Date/Time   CALCIUM 8.2* 09/02/2012 0814   CALCIUM 9.4 09/24/2011 1420   ALKPHOS 73 09/02/2012 0814   ALKPHOS 78 09/24/2011 1420   AST 27 09/02/2012 0814   AST 18 09/24/2011 1420   ALT 21 09/02/2012 0814    ALT 16 09/24/2011 1420   BILITOT 0.29 09/02/2012 0814   BILITOT 0.4 09/24/2011 1420       Radiological Studies: Dg Ribs Unilateral Right  09/07/2012  *RADIOLOGY REPORT*  Clinical Data: Right lower rib pain, history of metastatic breast cancer  RIGHT RIBS - 2 VIEW  Comparison: PET CT dated 09/02/2012  Findings: No displaced right rib fracture is seen.  No findings suspicious for right rib metastases, especially when correlating with the recent prior PET CT.  Status post right mastectomy with right axillary lymph node dissection.  Visualized right lung is essentially clear.  IMPRESSION: No right rib abnormality is seen.   Original Report Authenticated By: Charline Bills, M.D.    Dg Ankle Complete Right  08/10/2012  *RADIOLOGY REPORT*  Clinical Data: Known metastatic breast carcinoma, unexplained right ankle pain and swelling medially for several weeks  RIGHT ANKLE - COMPLETE 3+ VIEW  Comparison: None.  Findings: There is a nondisplaced fracture of the medial malleolus of the distal right tibia.  The ankle joint appears normal and alignment is normal.  A moderate sized plantar calcaneal degenerative spur is noted.  IMPRESSION: Nondisplaced subacute fracture of the medial malleolus.   Original Report Authenticated By: Dwyane Dee, M.D.    Nm Pet Image Restag (ps) Skull Base To Thigh  09/02/2012  *RADIOLOGY REPORT*  Clinical Data: Subsequent treatment strategy for metastatic breast cancer.  NUCLEAR MEDICINE PET SKULL BASE TO THIGH  Fasting Blood Glucose:  104  Technique:  19.30 mCi F-18 FDG was injected intravenously. CT data was obtained and used for attenuation correction and anatomic localization only.  (This was not acquired as a diagnostic CT examination.) Additional exam technical data entered on technologist worksheet.  Comparison:  05/22/2012  Findings:  Neck: No hypermetabolic lymph nodes in the neck.  Chest:  Bilateral mastectomies and bilateral axillary lymph node dissection has been performed.   There are patchy areas of ground- glass attenuation within the left lower lobe with associated increased FDG uptake.  A smaller area of ground-glass attenuation within the medial right lower lobe with increased FDG uptake is also noted.  There is a focal area of nonspecific increased uptake within the medial left upper lobe along the transverse aortic arch. On the CT images there are no discrete nodules or masses identified.  No hypermetabolic mediastinal or hilar lymph nodes identified.  Abdomen/Pelvis:  Stable small left adrenal nodule.  Stable low attenuation structures within the liver parenchyma without increased uptake.  Likely small cysts.  No abnormal hypermetabolic activity within the liver, pancreas, adrenal glands, or spleen.  No hypermetabolic lymph nodes in the abdomen or pelvis.  Skeleton:  No focal hypermetabolic activity to suggest metabolically active skeletalmetastasis.  Again noted are multiple lytic bone lesions in the thoracic and lumbar spine.  These appear unchanged from previous examination.  IMPRESSION:  1.  There are no specific features identified to suggest metabolically active tumor. 2.  Patchy areas of ground-glass attenuation in both lungs with associated increased FDG uptake.  Findings likely reflect a nonspecific  pneumonitis.  Advise follow-up imaging with noncontrast CT of the chest and 3 months to confirm resolution of these opacities. 3.  Similar appearance of multi focal osseous metastatic disease without the increased FDG uptake.   Original Report Authenticated By: Signa Kell, M.D.     Impression: #1. 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 Initial multiple node positive ER positive cancer of the left breast treated with lumpectomy, radiation, and chemotherapy. Subsequent progression to bone with initial excellent response to monthly Faslodex injections. Subsequent  concomitant appearance of a malignant left axillary adenopathy with no obvious primary in the left breast and a new large lesion in the right breast. Now status post bilateral mastectomies. Subsequent right chest wall recurrence treated with local radiation. Stable disease on oral Xeloda Further progression in bone and put back on hormonal therapy with a combination of tamoxifen plus Affinitor. Currently PET negative but symptomatic bone lesions with recent fracture of the right lateral malleolus and now the suspicion of active disease in the right ribs.  Plan: I am going to continue the tamoxifen and the Affinitor  for now.  I am going to obtain rib x-rays and a bone scan.   #2. History of extensive superficial thrombophlebitis on chronic full dose Coumadin anticoagulation  #3. Persistent GI complaints. I suggested that she get a followup appointment with her gastroenterologist to assist in management.   CC:.    Levert Feinstein, MD 5/12/20142:23 PM

## 2012-09-08 ENCOUNTER — Telehealth: Payer: Self-pay | Admitting: *Deleted

## 2012-09-08 NOTE — Telephone Encounter (Signed)
Message copied by Orbie Hurst on Tue Sep 08, 2012  2:51 PM ------      Message from: [name]      Created: Tue Sep 08, 2012 10:30 AM       Call pt - rib X-rays normal; will wait to see what bone scan shows ------

## 2012-09-08 NOTE — Telephone Encounter (Signed)
Spoke with patient. Let her know that her reb x-rays are normal.  We will wait to see what the bone scan shows.  She appreciated the call.

## 2012-09-11 ENCOUNTER — Telehealth: Payer: Self-pay | Admitting: *Deleted

## 2012-09-11 NOTE — Telephone Encounter (Signed)
Melissa called requesting an order for this patient's Bone scan.  Printed order for Bone Scan as ordered by Dr. Cyndie Chime on 09-07-2012 for Hulda Humphrey D.O.B. [id number].  Faxed to [phone].  Scan currently scheduled for Monday 09-14-2012 by Richardean Canal.

## 2012-09-17 ENCOUNTER — Telehealth: Payer: Self-pay | Admitting: *Deleted

## 2012-09-17 NOTE — Telephone Encounter (Signed)
Called patient.  Let her know that bone scan was negative.  She is going to start taking her protonix regularly and then see her GI doctor and see if this makes any difference.

## 2012-09-22 ENCOUNTER — Encounter: Payer: Self-pay | Admitting: Oncology

## 2012-09-22 NOTE — Progress Notes (Signed)
Fax from [facility] that Afinitor 7.5 mg was processed and shipped for delivery 09/24/12.

## 2012-09-29 ENCOUNTER — Other Ambulatory Visit: Payer: Self-pay | Admitting: *Deleted

## 2012-09-29 DIAGNOSIS — C7951 Secondary malignant neoplasm of bone: Secondary | ICD-10-CM

## 2012-09-29 DIAGNOSIS — C50919 Malignant neoplasm of unspecified site of unspecified female breast: Secondary | ICD-10-CM

## 2012-09-29 MED ORDER — HYDROCODONE-ACETAMINOPHEN 10-325 MG PO TABS: ORAL_TABLET | ORAL | Status: DC

## 2012-10-01 ENCOUNTER — Telehealth: Payer: Self-pay | Admitting: *Deleted

## 2012-10-01 NOTE — Telephone Encounter (Signed)
Late Entry:  Called pt yest & informed that labs done 09/24/12 show WBC & ANC down some.  Discussed with Lonna Cobb NP & she suggested repeat cbc in 1 wk.  She had this lab done at her PCP, Dr. Cato Mulligan office & will arrange to repeat this with them.  Informed to have them call us if they need orders.  She also reports that she has been nauseated recently & has been taking zofran sometimes bid.  She doesn't understand why she is nauseated now when she has been on the tamoxifen & affinitor for a good while. She reports that the zofran does help.

## 2012-10-05 ENCOUNTER — Other Ambulatory Visit: Payer: Self-pay | Admitting: *Deleted

## 2012-10-05 DIAGNOSIS — C7952 Secondary malignant neoplasm of bone marrow: Secondary | ICD-10-CM

## 2012-10-05 DIAGNOSIS — C50919 Malignant neoplasm of unspecified site of unspecified female breast: Secondary | ICD-10-CM

## 2012-10-05 MED ORDER — HYDROCODONE-ACETAMINOPHEN 10-325 MG PO TABS: ORAL_TABLET | ORAL | Status: DC

## 2012-10-05 NOTE — Telephone Encounter (Signed)
Pt called for refill on her hydrocodone & asked if she could get 360 tabs instead of 180 due to it being a hassel to get script q 2 wks .  OK'd with Dr Cyndie Chime & script will be called in to her pharmacy.

## 2012-10-08 ENCOUNTER — Telehealth: Payer: Self-pay | Admitting: *Deleted

## 2012-10-08 NOTE — Telephone Encounter (Signed)
Spoke with patient.  Per Dr. Cyndie Chime:  Labs are ok.  Continue on the affinitor 7.5mg  - no changes.  She appreciated the call.

## 2012-11-09 ENCOUNTER — Other Ambulatory Visit: Payer: Self-pay | Admitting: *Deleted

## 2012-11-09 ENCOUNTER — Ambulatory Visit (HOSPITAL_BASED_OUTPATIENT_CLINIC_OR_DEPARTMENT_OTHER): Payer: Medicare PPO | Admitting: Oncology

## 2012-11-09 ENCOUNTER — Telehealth: Payer: Self-pay | Admitting: Oncology

## 2012-11-09 ENCOUNTER — Other Ambulatory Visit (HOSPITAL_BASED_OUTPATIENT_CLINIC_OR_DEPARTMENT_OTHER): Payer: Medicare PPO | Admitting: Lab

## 2012-11-09 VITALS — BP 158/73 | HR 67 | Temp 97.2°F | Resp 20 | Ht 64.0 in | Wt 156.7 lb

## 2012-11-09 DIAGNOSIS — Z170 Estrogen receptor positive status [ER+]: Secondary | ICD-10-CM

## 2012-11-09 DIAGNOSIS — Z8672 Personal history of thrombophlebitis: Secondary | ICD-10-CM

## 2012-11-09 DIAGNOSIS — C50919 Malignant neoplasm of unspecified site of unspecified female breast: Secondary | ICD-10-CM

## 2012-11-09 DIAGNOSIS — C50419 Malignant neoplasm of upper-outer quadrant of unspecified female breast: Secondary | ICD-10-CM

## 2012-11-09 DIAGNOSIS — C7952 Secondary malignant neoplasm of bone marrow: Secondary | ICD-10-CM

## 2012-11-09 DIAGNOSIS — R0781 Pleurodynia: Secondary | ICD-10-CM

## 2012-11-09 DIAGNOSIS — C7951 Secondary malignant neoplasm of bone: Secondary | ICD-10-CM

## 2012-11-09 DIAGNOSIS — G893 Neoplasm related pain (acute) (chronic): Secondary | ICD-10-CM

## 2012-11-09 LAB — CBC WITH DIFFERENTIAL/PLATELET
Basophils Absolute: 0.1 10*3/uL (ref 0.0–0.1)
Eosinophils Absolute: 0 10*3/uL (ref 0.0–0.5)
HCT: 34.1 % — ABNORMAL LOW (ref 34.8–46.6)
HGB: 11.6 g/dL (ref 11.6–15.9)
MCV: 74.9 fL — ABNORMAL LOW (ref 79.5–101.0)
MONO%: 10.2 % (ref 0.0–14.0)
NEUT#: 3 10*3/uL (ref 1.5–6.5)
RDW: 16.2 % — ABNORMAL HIGH (ref 11.2–14.5)
lymph#: 0.3 10*3/uL — ABNORMAL LOW (ref 0.9–3.3)

## 2012-11-09 LAB — COMPREHENSIVE METABOLIC PANEL (CC13)
ALT: 17 U/L (ref 0–55)
AST: 25 U/L (ref 5–34)
Albumin: 3.2 g/dL — ABNORMAL LOW (ref 3.5–5.0)
Alkaline Phosphatase: 71 U/L (ref 40–150)
Potassium: 3.1 mEq/L — ABNORMAL LOW (ref 3.5–5.1)
Sodium: 144 mEq/L (ref 136–145)
Total Protein: 6.3 g/dL — ABNORMAL LOW (ref 6.4–8.3)

## 2012-11-09 LAB — PROTIME-INR: INR: 2.4 (ref 2.00–3.50)

## 2012-11-09 MED ORDER — HYDROCODONE-ACETAMINOPHEN 10-325 MG PO TABS: ORAL_TABLET | ORAL | Status: DC

## 2012-11-09 NOTE — Telephone Encounter (Signed)
gv and printed appt sched and avs forlpt....pt aware cs will call to sched scans

## 2012-11-10 ENCOUNTER — Ambulatory Visit (INDEPENDENT_AMBULATORY_CARE_PROVIDER_SITE_OTHER): Payer: Medicare PPO | Admitting: Internal Medicine

## 2012-11-10 ENCOUNTER — Telehealth: Payer: Self-pay | Admitting: Oncology

## 2012-11-10 ENCOUNTER — Encounter: Payer: Self-pay | Admitting: Internal Medicine

## 2012-11-10 VITALS — BP 140/72 | HR 70 | Temp 98.1°F | Ht 68.0 in | Wt 158.0 lb

## 2012-11-10 DIAGNOSIS — R6881 Early satiety: Secondary | ICD-10-CM

## 2012-11-10 DIAGNOSIS — R1319 Other dysphagia: Secondary | ICD-10-CM

## 2012-11-10 DIAGNOSIS — R1011 Right upper quadrant pain: Secondary | ICD-10-CM

## 2012-11-10 DIAGNOSIS — G8929 Other chronic pain: Secondary | ICD-10-CM

## 2012-11-10 DIAGNOSIS — R634 Abnormal weight loss: Secondary | ICD-10-CM

## 2012-11-10 NOTE — Progress Notes (Signed)
Primary Care Physician:  BURDINE,STEVEN E, MD Primary Gastroenterologist:  Dr. Donte Lenzo  Pre-Procedure History & Physical: HPI:  [name] is a 67 y.o. female with a history of metastatic breast cancer here for further evaluation of weight loss epigastric/right upper quadrant abdominal pain and dysphagia. Ms. Borkenhagen states that she has felt particularly poorly over the past 3 months. She notes not able to eat very much  - fills up early. Complains of intermittent right upper quadrant abdominal pain. 14 pound weight loss the past 3 months. About 40 pounds of weight loss since 2011. She notes intermittent insidiously progressive esophageal dysphagia to solid food and pills. She has not had any melena or rectal bleeding. No nausea or vomiting. Has a history extensive prior chemotherapy and radiation to her chest. Liver enzymes from May of this year were all normal. She historically has takes Protonix 40 mg once daily with good control of her occasional typical reflux symptoms. Last EGD 2011. At that time, an adenomatous duodenal polyp was removed. She had mild gastric mucosal inflammation without H. pylori on biopsies. Last colonoscopy also performed in 2011. Small colonic adenoma removed. Pancolonic diverticulosis. Recent nuclear medicine bone scan and PET scan negative for metastatic disease although she is known to have this problem.   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  No history of recent neutropenia although she has been noted to be mildly leukopenic. 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 She has worked there for 30 years.  Past Medical History  Diagnosis Date  . Cancer     RBreast, Lower back , Under l Arm  . Hypertension   . GERD (gastroesophageal reflux disease)   . Breast cancer metastasized to bone 04/30/2011  . Radiation 01/15/2012    Lumbosacral spine 3500 cGy 14  fx  . Swollen R ankle 08/10/2012   . Fracture of ankle, medial malleolus, right, closed 08/10/2012  . Tubular adenoma 2011  . Hiatal hernia 2011    Past Surgical History  Procedure Laterality Date  . Gallbladder surgery  1991  . Mastectomy  06/28/10    bilateral   . Inguinal hernia repair  90's    right  . Parathyroidectomy  90's  . Carpal tunnel release  90's  . Oophorectomy  90's    right  . Lymph node dissection    . Breast surgery    . Cholecystectomy    . Colonoscopy  01/08/10    Dr. Nashid Pellum- anal tag,hemorrhoid o/w normal rectum, pancolonic diverticula, diminutive polyp in the base of the cecum= tubular adenoma on bx. poor prep  . Esophagogastroduodenoscopy  01/08/10    Dr. Galilee Pierron- normal esophagus, small hiatal hernia, antum and body erosions, pedunculated polyp between D1 and D2, duodenal diverticulum, lymphangiectasia, second portion of the duodenum.stomach bx= inflammation, duodenum bx= adenoma    Prior to Admission medications   Medication Sig Start Date End Date Taking? Authorizing Provider  cholecalciferol (VITAMIN D) 1000 UNITS tablet Take 2,000 Units by mouth daily.   Yes Historical Provider, MD  diphenoxylate-atropine (LOMOTIL) 2.5-0.025 MG per tablet as needed.  01/23/12  Yes Historical Provider, MD  everolimus (AFINITOR) 7.5 MG tablet Take 1 tablet (7.5 mg total) by mouth daily. Script given to Ebony Smith 08/12/12 who faxed it to [facility]. 08/12/12  Yes James M Granfortuna, MD  Homeopathic Products (ARNICA) GEL Apply topically every morning. Feet, legs to the knees   Yes Historical Provider, MD  HYDROcodone-acetaminophen (NORCO)   10-325 MG per tablet 1-2 tabs po q 4 h prn pain 11/09/12  Yes James M Granfortuna, MD  lisinopril (PRINIVIL,ZESTRIL) 20 MG tablet Take 20 mg by mouth daily.  10/08/10  Yes Historical Provider, MD  mometasone (ELOCON) 0.1 % lotion Apply topically daily as needed. Use as directed 06/29/12  Yes James M Granfortuna, MD  ondansetron (ZOFRAN ODT) 8 MG disintegrating tablet 1 tablet  SL every 6-8 hours and needed for nausea/vomiting. Prescription given to patient 02/03/12  Yes James M Granfortuna, MD  pantoprazole (PROTONIX) 40 MG tablet Take 40 mg by mouth daily as needed. For indigestion 05/04/12  Yes James M Granfortuna, MD  prochlorperazine (COMPAZINE) 10 MG tablet Take 10 mg by mouth every 6 (six) hours as needed.  01/20/12  Yes Historical Provider, MD  tamoxifen (NOLVADEX) 20 MG tablet Take 1 tablet (20 mg total) by mouth daily. Written Script given to pt 05/22/12 05/22/12  Yes James M Granfortuna, MD  warfarin (COUMADIN) 4 MG tablet Take 4 mg by mouth daily. 2 mg MWF 08/16/10  Yes Historical Provider, MD    Allergies as of 11/10/2012 - Review Complete 11/10/2012  Allergen Reaction Noted  . Percodan (oxycodone-aspirin) Nausea Only   . Codeine Nausea Only     Family History  Problem Relation Age of Onset  . Cancer Mother     Colon  . Cancer Father     Lung, Mouth,leg    History   Social History  . Marital Status: Widowed    Spouse Name: N/A    Number of Children: N/A  . Years of Education: N/A   Occupational History  . Not on file.   Social History Main Topics  . Smoking status: Former Smoker    Quit date: 11/22/1982  . Smokeless tobacco: Not on file  . Alcohol Use: No  . Drug Use: Not on file  . Sexually Active: Not on file   Other Topics Concern  . Not on file   Social History Narrative  . No narrative on file    Review of Systems: See HPI, otherwise negative ROS  Physical Exam: BP 140/72  Pulse 70  Temp(Src) 98.1 F (36.7 C) (Oral)  Ht 5' 8" (1.727 m)  Wt 158 lb (71.668 kg)  BMI 24.03 kg/m2 General:   Alert, comfortable,-appearing lady pleasant resting without acute distress  Skin:  Intact without significant lesions or rashes. Eyes:  Sclera clear, no icterus.   Conjunctiva pink. Ears:  Normal auditory acuity. Nose:  No deformity, discharge,  or lesions. Mouth:  No deformity or lesions. Neck:  Supple; no masses or thyromegaly. No  significant cervical adenopathy. Lungs:  Clear throughout to auscultation.   No wheezes, crackles, or rhonchi. No acute distress. Heart:  Regular rate and rhythm; no murmurs, clicks, rubs,  or gallops. Abdomen: Full. Positive bowel sounds. Soft with some epigastric tenderness. No obvious mass or organomegaly. Pulses:  Normal pulses noted. Extremities:  Without clubbing or edema.  Impression/Plan:  Pleasant 67-year-old lady with a complicated history of metastatic breast carcinoma now with insidious onset of progressing weight loss early satiety, epigastric and right upper quadrant abdominal pain along with esophageal dysphagia. History of chest radiation. Chronic GERD symptoms well controlled on acid suppression therapy. Gallbladder is out. Recent liver enzymes normal. Recent imaging including a PET scan failed to demonstrate any compelling evidence for any intra-abdominal neoplastic process.  History of a duodenal adenoma removed previously.  She gives an impressive history of progressive esophageal dysphagia. I suppose she   could have radiation induced stricture. A benign peptic stricture is also in the differential. I doubt we are dealing with a neoplastic process causing her GI symptoms, per say. Symptoms would be atypical for peptic ulcer disease.  Recommendations: I offered this lady both a diagnostic and therapeutic EGD in the near future.  The risks, benefits, limitations, alternatives and imponderables have been reviewed with the patient. Potential for esophageal dilation, biopsy, etc. have also been reviewed.  Questions have been answered. All parties agreeable.  I have communicated with Dr. Granfortuna. He recommended it would be just fine to stop her Coumadin 4 days prior to procedure without any bridging maneuvers.  Further recommendations to follow.  I'd like to thank  Dr. Granfortuna for allowing me to see this nice lady once again.   

## 2012-11-10 NOTE — Patient Instructions (Signed)
Schedule an EGD with possible esophageal dilation under propofol  Will need to check with Dr. Cyndie Chime regarding management of you coumadin

## 2012-11-10 NOTE — Progress Notes (Signed)
Hematology and Oncology Follow Up Visit  [name] [id number] 1945/10/02 67 y.o. 11/10/2012 7:34 PM   Principle Diagnosis: Encounter Diagnosis  Name Primary?  . Breast cancer metastasized to bone, unspecified laterality Yes     Interim History:   Short interim followup visit for this complicated 67 year old woman with a history of metachronous primary bilateral breast cancers .Initial stage II multiple node positive ER positive cancer right breast diagnosed December 2006 treated with lumpectomy radiation and chemotherapy. Progression to bone while on tamoxifen September 2011. Initial complete response to monthly Faslodex hormonal injections .  Second primary, presumed left breast, detected by abnormal PET activity in the left axilla November 2011. While under evaluation for this abnormality she was found to have a third primary in the right breast. Bilateral mastectomies done 06/28/2010. No obvious primary found in the left breast tissue but the node was ER negative consistent with a second primary. The 2.2 cm invasive cancer in the right breast was ER positive PR negative HER-2 negative similar to her initial right breast cancer but consistent with a third primary.. She underwent bilateral mastectomies. She received initial radiation to her left chest wall and left supraclavicular region. She developed abnormal erythema medial aspect right mastectomy scar with biopsies consistent with cutaneous metastases as noted above. She was successfully treated with electron beam radiation and has had no further chest wall recurrences. She had further progression in bone in August 2013. She received palliative radiation to the lumbosacral spine in September and October 2013. She was put on single agent Xeloda chemotherapy and had stable disease until January 2014 when she had further progression in bone. She was started on a combination of tamoxifen plus Affinitor. I slowly escalated the dose up to 7.5  mg on the Affinitor. Overall she has tolerated the drug well. We are seeing some moderate myelosuppression. Dry mouth. Loss of appetite.  She has ongoing and progressive gastritis symptoms. These initially improved significantly with twice a day proton pump inhibitor treatment. She cut back to once a day. Symptoms have recurred. A few weeks ago she had an episode of nausea, vomiting, and diarrhea and then developed bronchitis. She was evaluated by her primary care physician. She was put on outpatient antibiotics. Probably Biaxin. She developed GI intolerance to the Biaxin.  She has had intermittent low back pain in the area of known disease and likely within previous radiation port. No other new areas of pain.  A restaging PET scan and bone scan done 09/14/2012 did not show any gross evidence for new or progressive disease. Her bone lesions are sclerotic and likely not hypermetabolic and may be underestimated by the PET scan.  Due to her ongoing gastritis symptoms, she has lost a significant amount of weight. She is down 16 pounds with weight of 172 pounds recorded in March and weight 156 pounds today. She is scheduled to see her gastroenterologist tomorrow.     Medications: reviewed  Allergies:  Allergies  Allergen Reactions  . Percodan (Oxycodone-Aspirin) Nausea Only  . Codeine Nausea Only    Review of Systems: Constitutional:   Ongoing fatigue. Anorexia. Weight loss. Respiratory: No cough or dyspnea Cardiovascular:  No chest pain or palpitations Gastrointestinal: see above Genito-Urinary: No urinary tract symptoms Musculoskeletal: See above; ongoing orthopedic problems with her feet. Neurologic: No headache or change in vision. No focal deficit. Skin: No rash Remaining ROS negative.  Physical Exam: Blood pressure 158/73, pulse 67, temperature 97.2 F (36.2 C), temperature source Oral, resp. rate 20,  height 5\' 4"  (1.626 m), weight 156 lb 11.2 oz (71.079 kg). Wt Readings from  Last 3 Encounters:  11/10/12 158 lb (71.668 kg)  11/09/12 156 lb 11.2 oz (71.079 kg)  09/07/12 164 lb 6.4 oz (74.571 kg)     General appearance: Thin Caucasian woman HENNT: Pharynx no erythema, exudate, or ulcer Lymph nodes: No adenopathy Breasts: Bilateral mastectomies. No chest wall lesions at this time Lungs: Clear to auscultation resonant to percussion Heart: Regular rhythm no murmur Abdomen: Soft, tender in the epigastric region, no palpable mass or organomegaly Extremities: No edema, no calf tenderness Musculoskeletal: No joint deformities GU: Vascular: No cyanosis Neurologic: Mental status intact, PERRLA, optic disc sharp and vessels normal, motor strength 5 over 5, reflexes 1+ symmetric Skin: No rash or ecchymosis  Lab Results: Lab Results  Component Value Date   WBC 3.8* 11/09/2012   HGB 11.6 11/09/2012   HCT 34.1* 11/09/2012   MCV 74.9* 11/09/2012   PLT 200 11/09/2012     Chemistry      Component Value Date/Time   NA 144 11/09/2012 0934   NA 141 09/24/2011 1420   K 3.1* 11/09/2012 0934   K 4.0 09/24/2011 1420   CL 110* 09/02/2012 0814   CL 104 09/24/2011 1420   CO2 27 11/09/2012 0934   CO2 26 09/24/2011 1420   BUN 9.9 11/09/2012 0934   BUN 17 09/24/2011 1420   CREATININE 0.7 11/09/2012 0934   CREATININE 0.58 09/24/2011 1420      Component Value Date/Time   CALCIUM 8.3* 11/09/2012 0934   CALCIUM 9.4 09/24/2011 1420   ALKPHOS 71 11/09/2012 0934   ALKPHOS 78 09/24/2011 1420   AST 25 11/09/2012 0934   AST 18 09/24/2011 1420   ALT 17 11/09/2012 0934   ALT 16 09/24/2011 1420   BILITOT 0.25 11/09/2012 0934   BILITOT 0.4 09/24/2011 1420    CA 2729 :44 units compared with 49 units in April, 40 units in March, 38 units in January   Radiological Studies: See discussion above   Impression: #1. Metachronous primary bilateral breast cancers ER positive and PR negative. Metastatic to bone. Overall stable disease since most recent progression in January of this year. No gross  evidence for disease on bone scan and PET scan done in May. Mildly elevated but stable CA 27-29 tumor marker Plan: I will continue the tamoxifen and Affinitor at this time. I'm going to get MRI scans of her spine in October since they have been more sensitive to her disease than bone scans or PET scan.  #2. Chronic gastritis This is now significantly impacting on her with a 16 pound weight loss.  She will be reevaluated by her gastroenterologist tomorrow. She will likely need a followup upper endoscopy.  #3. History of extensive superficial thrombophlebitis She remains on full dose Coumadin anticoagulation  #4. Multifactorial pain from cancer and orthopedic problems.  #5. Status post right rotator cuff repair    CC:. Dr. Quintin Alto, Dr. Arline Asp, Dr. Antony Blackbird   Carolyn Feinstein, MD 7/15/20147:34 PM

## 2012-11-10 NOTE — Telephone Encounter (Signed)
lvm for pt regarding to 8.19 and 8.26.14 appts.Marland KitchenMarland Kitchen

## 2012-11-11 ENCOUNTER — Encounter: Payer: Self-pay | Admitting: Internal Medicine

## 2012-11-11 NOTE — Progress Notes (Signed)
Patient called and wanted her pre-op visit changed to another date.  New appt is 11/17/12@3 :00pm.  Pt is aware of new appt.

## 2012-11-13 ENCOUNTER — Telehealth: Payer: Self-pay | Admitting: General Practice

## 2012-11-13 ENCOUNTER — Encounter (HOSPITAL_COMMUNITY): Payer: Self-pay | Admitting: Pharmacy Technician

## 2012-11-13 NOTE — Telephone Encounter (Signed)
Pt called and wanted to know how long she needed to be off her Coumadin.  I made patient aware that it was 4 days prior to her procedure and it was also listed on her instructions.

## 2012-11-16 ENCOUNTER — Other Ambulatory Visit (HOSPITAL_COMMUNITY): Payer: Medicare PPO

## 2012-11-17 ENCOUNTER — Encounter (HOSPITAL_COMMUNITY): Payer: Self-pay

## 2012-11-17 ENCOUNTER — Telehealth: Payer: Self-pay | Admitting: *Deleted

## 2012-11-17 ENCOUNTER — Encounter (HOSPITAL_COMMUNITY)
Admission: RE | Admit: 2012-11-17 | Discharge: 2012-11-17 | Disposition: A | Discharge status: Home / Self Care | Payer: Medicare PPO | Source: Ambulatory Visit | Attending: Internal Medicine | Admitting: Internal Medicine

## 2012-11-17 ENCOUNTER — Other Ambulatory Visit: Payer: Self-pay

## 2012-11-17 NOTE — Patient Instructions (Signed)
[name]  11/17/2012   Your procedure is scheduled on:  Thursday, 11/19/12  [facility] to [facility] AM.  Call this number if you have problems the morning of surgery: [phone]   Remember:   Do not eat food or drink liquids after midnight.   Take these medicines the morning of surgery with A SIP OF WATER: norco, lisinopril, zofran, protonix   Do not wear jewelry, make-up or nail polish.  Do not wear lotions, powders, or perfumes. You may wear deodorant.  Do not shave 48 hours prior to surgery. Men may shave face and neck.  Do not bring valuables to the hospital.  [facility] is not responsible                   for any belongings or valuables.  Contacts, dentures or bridgework may not be worn into surgery.  Leave suitcase in the car. After surgery it may be brought to your room.  For patients admitted to the hospital, checkout time is 11:00 AM the day of  discharge.   Patients discharged the day of surgery will not be allowed to drive  home.  Name and phone number of your driver: family  Special Instructions: N/A   Please read over the following fact sheets that you were given: Anesthesia Post-op Instructions  Esophagogastroduodenoscopy Esophagogastroduodenoscopy (EGD) is a procedure to examine the lining of the esophagus, stomach, and first part of the small intestine (duodenum). A long, flexible, lighted tube with a camera attached (endoscope) is inserted down the throat to view these organs. This procedure is done to detect problems or abnormalities, such as inflammation, bleeding, ulcers, or growths, in order to treat them. The procedure lasts about 5 20 minutes. It is usually an outpatient procedure, but it may need to be performed in emergency cases in the hospital. LET YOUR CAREGIVER KNOW ABOUT:   Allergies to food or medicine.  All medicines you are taking, including vitamins, herbs, eyedrops, and over-the-counter medicines and creams.  Use of steroids (by mouth or  creams).  Previous problems you or members of your family have had with the use of anesthetics.  Any blood disorders you have.  Previous surgeries you have had.  Other health problems you have.  Possibility of pregnancy, if this applies. RISKS AND COMPLICATIONS  Generally, EGD is a safe procedure. However, as with any procedure, complications can occur. Possible complications include:  Infection.  Bleeding.  Tearing (perforation) of the esophagus, stomach, or duodenum.  Difficulty breathing or not being able to breath.  Excessive sweating.  Spasms of the larynx.  Slowed heartbeat.  Low blood pressure. BEFORE THE PROCEDURE  Do not eat or drink anything for 6 8 hours before the procedure or as directed by your caregiver.  Ask your caregiver about changing or stopping your regular medicines.  If you wear dentures, be prepared to remove them before the procedure.  Arrange for someone to drive you home after the procedure. PROCEDURE   A vein will be accessed to give medicines and fluids. A medicine to relax you (sedative) and a pain reliever will be given through that access into the vein.  A numbing medicine (local anesthetic) may be sprayed on your throat for comfort and to stop you from gagging or coughing.  A mouth guard may be placed in your mouth to protect your teeth and to keep you from biting on the endoscope.  You will be asked to lie on your left side.  The endoscope is inserted down your throat and into the esophagus, stomach, and duodenum.  Air is put through the endoscope to allow your caregiver to view the lining of your esophagus clearly.  The esophagus, stomach, and duodenum is then examined. During the exam, your caregiver may:  Remove tissue to be examined under a microscope (biopsy) for inflammation, infection, or other medical problems.  Remove growths.  Remove objects (foreign bodies) that are stuck.  Treat any bleeding with medicines or  other devices that stop tissues from bleeding (hot cauters, clipping devices).  Widen (dilate) or stretch narrowed areas of the esophagus and stomach.  The endoscope will then be withdrawn. AFTER THE PROCEDURE  You will be taken to a recovery area to be monitored. You will be able to go home once you are stable and alert.  Do not eat or drink anything until the local anesthetic and numbing medicines have worn off. You may choke.  It is normal to feel bloated, have pain with swallowing, or have a sore throat for a short time. This will wear off.  Your caregiver should be able to discuss his or her findings with you. It will take longer to discuss the test results if any biopsies were taken. Document Released: 08/16/2004 Document Revised: 04/01/2012 Document Reviewed: 03/18/2012 [facility] Patient Information 2014 [place], [place].    PATIENT INSTRUCTIONS POST-ANESTHESIA  IMMEDIATELY FOLLOWING SURGERY:  Do not drive or operate machinery for the first twenty four hours after surgery.  Do not make any important decisions for twenty four hours after surgery or while taking narcotic pain medications or sedatives.  If you develop intractable nausea and vomiting or a severe headache please notify your doctor immediately.  FOLLOW-UP:  Please make an appointment with your surgeon as instructed. You do not need to follow up with anesthesia unless specifically instructed to do so.  WOUND CARE INSTRUCTIONS (if applicable):  Keep a dry clean dressing on the anesthesia/puncture wound site if there is drainage.  Once the wound has quit draining you may leave it open to air.  Generally you should leave the bandage intact for twenty four hours unless there is drainage.  If the epidural site drains for more than 36-48 hours please call the anesthesia department.  QUESTIONS?:  Please feel free to call your physician or the hospital operator if you have any questions, and they will be happy to assist you.

## 2012-11-17 NOTE — Telephone Encounter (Signed)
Spoke with patient.  Let her know results of tumor marker and to stay on current RX pending results of upcoming scans.  She appreciated the call.

## 2012-11-17 NOTE — Telephone Encounter (Signed)
Message copied by Orbie Hurst on Tue Nov 17, 2012 12:24 PM ------      Message from: Levert Feinstein      Created: Fri Nov 13, 2012 12:48 PM       Call pt tumor marker slightly lower @ 44  Was 49  Stay on current Rx pending results of upcoming scans ------

## 2012-11-18 ENCOUNTER — Encounter: Payer: Self-pay | Admitting: Internal Medicine

## 2012-11-19 ENCOUNTER — Ambulatory Visit (HOSPITAL_COMMUNITY)
Admission: RE | Admit: 2012-11-19 | Discharge: 2012-11-19 | Disposition: A | Discharge status: Home / Self Care | Payer: Medicare PPO | Source: Ambulatory Visit | Attending: Internal Medicine | Admitting: Internal Medicine

## 2012-11-19 ENCOUNTER — Encounter (HOSPITAL_COMMUNITY): Payer: Self-pay | Admitting: Anesthesiology

## 2012-11-19 ENCOUNTER — Encounter (HOSPITAL_COMMUNITY): Payer: Self-pay | Admitting: *Deleted

## 2012-11-19 ENCOUNTER — Encounter (HOSPITAL_COMMUNITY)
Admission: RE | Disposition: A | Discharge status: Home / Self Care | Payer: Self-pay | Source: Ambulatory Visit | Attending: Internal Medicine

## 2012-11-19 ENCOUNTER — Ambulatory Visit (HOSPITAL_COMMUNITY): Payer: Medicare PPO | Admitting: Anesthesiology

## 2012-11-19 DIAGNOSIS — R1013 Epigastric pain: Secondary | ICD-10-CM

## 2012-11-19 DIAGNOSIS — D1330 Benign neoplasm of unspecified part of small intestine: Secondary | ICD-10-CM | POA: Insufficient documentation

## 2012-11-19 DIAGNOSIS — I10 Essential (primary) hypertension: Secondary | ICD-10-CM | POA: Insufficient documentation

## 2012-11-19 DIAGNOSIS — K449 Diaphragmatic hernia without obstruction or gangrene: Secondary | ICD-10-CM | POA: Insufficient documentation

## 2012-11-19 DIAGNOSIS — K222 Esophageal obstruction: Secondary | ICD-10-CM

## 2012-11-19 DIAGNOSIS — R6881 Early satiety: Secondary | ICD-10-CM | POA: Insufficient documentation

## 2012-11-19 DIAGNOSIS — R1011 Right upper quadrant pain: Secondary | ICD-10-CM | POA: Insufficient documentation

## 2012-11-19 DIAGNOSIS — R1310 Dysphagia, unspecified: Secondary | ICD-10-CM

## 2012-11-19 DIAGNOSIS — D131 Benign neoplasm of stomach: Secondary | ICD-10-CM

## 2012-11-19 DIAGNOSIS — Z01810 Encounter for preprocedural cardiovascular examination: Secondary | ICD-10-CM | POA: Insufficient documentation

## 2012-11-19 DIAGNOSIS — Z853 Personal history of malignant neoplasm of breast: Secondary | ICD-10-CM | POA: Insufficient documentation

## 2012-11-19 HISTORY — PX: POLYPECTOMY: SHX5525

## 2012-11-19 HISTORY — PX: BIOPSY: SHX5522

## 2012-11-19 HISTORY — PX: ESOPHAGOGASTRODUODENOSCOPY (EGD) WITH PROPOFOL: SHX5813

## 2012-11-19 HISTORY — PX: MALONEY DILATION: SHX5535

## 2012-11-19 SURGERY — ESOPHAGOGASTRODUODENOSCOPY (EGD) WITH PROPOFOL
Anesthesia: Monitor Anesthesia Care | Site: Esophagus

## 2012-11-19 MED ORDER — ONDANSETRON HCL 4 MG/2ML IJ SOLN: 4.0000 mg | Freq: Once | INTRAMUSCULAR | Status: DC | PRN

## 2012-11-19 MED ORDER — LACTATED RINGERS IV SOLN: INTRAVENOUS | Status: DC

## 2012-11-19 MED ORDER — ONDANSETRON HCL 4 MG/2ML IJ SOLN
INTRAMUSCULAR | Status: AC
  Filled 2012-11-19: qty 2

## 2012-11-19 MED ORDER — PROPOFOL INFUSION 10 MG/ML OPTIME
INTRAVENOUS | Status: DC | PRN
  Administered 2012-11-19: 10:00:00 via INTRAVENOUS
  Administered 2012-11-19: 100 ug/kg/min via INTRAVENOUS
  Administered 2012-11-19: 10:00:00 via INTRAVENOUS

## 2012-11-19 MED ORDER — FENTANYL CITRATE 0.05 MG/ML IJ SOLN: 25.0000 ug | INTRAMUSCULAR | Status: DC | PRN

## 2012-11-19 MED ORDER — 0.9 % SODIUM CHLORIDE (POUR BTL) OPTIME
TOPICAL | Status: DC | PRN
  Administered 2012-11-19: 1000 mL

## 2012-11-19 MED ORDER — MIDAZOLAM HCL 2 MG/2ML IJ SOLN
1.0000 mg | INTRAMUSCULAR | Status: DC | PRN
  Administered 2012-11-19: 2 mg via INTRAVENOUS

## 2012-11-19 MED ORDER — FENTANYL CITRATE 0.05 MG/ML IJ SOLN
INTRAMUSCULAR | Status: AC
Start: 2012-11-19 — End: 2012-11-19
  Filled 2012-11-19: qty 2

## 2012-11-19 MED ORDER — FENTANYL CITRATE 0.05 MG/ML IJ SOLN
INTRAMUSCULAR | Status: AC
  Filled 2012-11-19: qty 2

## 2012-11-19 MED ORDER — GLUCAGON HCL (RDNA) 1 MG IJ SOLR
INTRAMUSCULAR | Status: DC | PRN
  Administered 2012-11-19: .5 mg via INTRAVENOUS

## 2012-11-19 MED ORDER — LIDOCAINE HCL (PF) 1 % IJ SOLN
INTRAMUSCULAR | Status: AC
  Filled 2012-11-19: qty 5

## 2012-11-19 MED ORDER — FENTANYL CITRATE 0.05 MG/ML IJ SOLN
25.0000 ug | INTRAMUSCULAR | Status: AC
  Administered 2012-11-19 (×2): 25 ug via INTRAVENOUS

## 2012-11-19 MED ORDER — MIDAZOLAM HCL 2 MG/2ML IJ SOLN: 1.0000 mg | INTRAMUSCULAR | Status: DC | PRN

## 2012-11-19 MED ORDER — FENTANYL CITRATE 0.05 MG/ML IJ SOLN
INTRAMUSCULAR | Status: DC | PRN
  Administered 2012-11-19 (×2): 25 ug via INTRAVENOUS
  Administered 2012-11-19 (×2): 12.5 ug via INTRAVENOUS
  Administered 2012-11-19 (×3): 25 ug via INTRAVENOUS

## 2012-11-19 MED ORDER — PROPOFOL 10 MG/ML IV EMUL
INTRAVENOUS | Status: AC
  Filled 2012-11-19: qty 20

## 2012-11-19 MED ORDER — FENTANYL CITRATE 0.05 MG/ML IJ SOLN
25.0000 ug | INTRAMUSCULAR | Status: DC | PRN
  Administered 2012-11-19: 50 ug via INTRAVENOUS

## 2012-11-19 MED ORDER — FENTANYL CITRATE 0.05 MG/ML IJ SOLN: 25.0000 ug | INTRAMUSCULAR | Status: AC

## 2012-11-19 MED ORDER — BUTAMBEN-TETRACAINE-BENZOCAINE 2-2-14 % EX AERO
1.0000 | INHALATION_SPRAY | Freq: Once | CUTANEOUS | Status: AC
  Administered 2012-11-19: 1 via TOPICAL
  Filled 2012-11-19: qty 56

## 2012-11-19 MED ORDER — LACTATED RINGERS IV SOLN
INTRAVENOUS | Status: DC
  Administered 2012-11-19: 1000 mL via INTRAVENOUS

## 2012-11-19 MED ORDER — STERILE WATER FOR IRRIGATION IR SOLN
Status: DC | PRN
  Administered 2012-11-19: 10:00:00

## 2012-11-19 MED ORDER — LIDOCAINE HCL (CARDIAC) 10 MG/ML IV SOLN
INTRAVENOUS | Status: DC | PRN
  Administered 2012-11-19 (×3): 20 mg via INTRAVENOUS

## 2012-11-19 MED ORDER — ONDANSETRON HCL 4 MG/2ML IJ SOLN
4.0000 mg | Freq: Once | INTRAMUSCULAR | Status: AC
  Administered 2012-11-19: 4 mg via INTRAVENOUS

## 2012-11-19 MED ORDER — PROPOFOL 10 MG/ML IV BOLUS
INTRAVENOUS | Status: DC | PRN
  Administered 2012-11-19: 6 mg via INTRAVENOUS
  Administered 2012-11-19: 10 mg via INTRAVENOUS
  Administered 2012-11-19: 6 mg via INTRAVENOUS

## 2012-11-19 MED ORDER — ONDANSETRON HCL 4 MG/2ML IJ SOLN: 4.0000 mg | Freq: Once | INTRAMUSCULAR | Status: DC

## 2012-11-19 MED ORDER — MIDAZOLAM HCL 2 MG/2ML IJ SOLN
INTRAMUSCULAR | Status: AC
  Filled 2012-11-19: qty 2

## 2012-11-19 SURGICAL SUPPLY — 18 items
BAND LIGATOR SUPER 7 2.8 (MISCELLANEOUS) ×3 IMPLANT
BLOCK BITE 60FR ADLT L/F BLUE (MISCELLANEOUS) ×3 IMPLANT
CONT PRE FILLED 20ML (MISCELLANEOUS) ×9 IMPLANT
ELECT REM PT RETURN 9FT ADLT (ELECTROSURGICAL) ×3
ELECTRODE REM PT RTRN 9FT ADLT (ELECTROSURGICAL) ×2 IMPLANT
FLOOR PAD 36X40 (MISCELLANEOUS) ×3
FORCEPS BIOP RAD 4 LRG CAP 4 (CUTTING FORCEPS) ×3 IMPLANT
MANIFOLD NEPTUNE II (INSTRUMENTS) ×3 IMPLANT
NEEDLE SCLEROTHERAPY 25GX240 (NEEDLE) ×3 IMPLANT
NS IRRIG 1000ML POUR BTL (IV SOLUTION) ×3 IMPLANT
PAD FLOOR 36X40 (MISCELLANEOUS) ×2 IMPLANT
ROTH PLATINUM NET UNIVERSAL (MISCELLANEOUS) ×3 IMPLANT
SNARE ROTATE MED OVAL 20MM (MISCELLANEOUS) ×3 IMPLANT
SYR 50ML LL SCALE MARK (SYRINGE) ×3 IMPLANT
TRAP SPECIMEN MUCOUS 40CC (MISCELLANEOUS) ×6 IMPLANT
TUBING ENDO SMARTCAP PENTAX (MISCELLANEOUS) ×3 IMPLANT
TUBING IRRIGATION ENDOGATOR (MISCELLANEOUS) ×3 IMPLANT
WATER STERILE IRR 1000ML POUR (IV SOLUTION) ×3 IMPLANT

## 2012-11-19 NOTE — Anesthesia Postprocedure Evaluation (Signed)
Anesthesia Post Note  Patient: [name]  Procedure(s) Performed: Procedure(s) (LRB): ESOPHAGOGASTRODUODENOSCOPY (EGD) WITH PROPOFOL (N/A) MALONEY DILATION (N/A) POLYPECTOMY (N/A) BIOPSY (N/A)  Anesthesia type: MAC  Patient location: PACU  Post pain: Pain level controlled  Post assessment: Post-op Vital signs reviewed, Patient's Cardiovascular Status Stable, Respiratory Function Stable, Patent Airway, No signs of Nausea or vomiting and Pain level controlled  Last Vitals:  Filed Vitals:   11/19/12 1041  BP: 108/95  Pulse: 90  Temp: 36.9 C  Resp: 18    Post vital signs: Reviewed and stable  Level of consciousness: awake and alert   Complications: No apparent anesthesia complications

## 2012-11-19 NOTE — Anesthesia Procedure Notes (Signed)
Procedure Name: MAC Date/Time: 11/19/2012 9:12 AM Performed by: Franco Nones Pre-anesthesia Checklist: Patient identified, Emergency Drugs available, Suction available, Timeout performed and Patient being monitored Patient Re-evaluated:Patient Re-evaluated prior to inductionOxygen Delivery Method: Non-rebreather mask

## 2012-11-19 NOTE — Anesthesia Preprocedure Evaluation (Signed)
Anesthesia Evaluation  Patient identified by MRN, date of birth, ID band Patient awake    Reviewed: Allergy & Precautions, H&P , NPO status , Patient's Chart, lab work & pertinent test results  History of Anesthesia Complications Negative for: history of anesthetic complications  Airway Mallampati: II TM Distance: >3 FB Neck ROM: Full    Dental  (+) Poor Dentition, Loose, Partial Lower, Partial Upper and Dental Advisory Given   Pulmonary neg pulmonary ROS, former smoker,  breath sounds clear to auscultation        Cardiovascular Rhythm:Regular Rate:Normal     Neuro/Psych    GI/Hepatic hiatal hernia, GERD-  Medicated,  Endo/Other    Renal/GU      Musculoskeletal   Abdominal   Peds  Hematology   Anesthesia Other Findings hx metastatic breast CA, s/p radiation Rx  Reproductive/Obstetrics                           Anesthesia Physical Anesthesia Plan  ASA: III  Anesthesia Plan: MAC   Post-op Pain Management:    Induction: Intravenous  Airway Management Planned: Simple Face Mask  Additional Equipment:   Intra-op Plan:   Post-operative Plan:   Informed Consent: I have reviewed the patients History and Physical, chart, labs and discussed the procedure including the risks, benefits and alternatives for the proposed anesthesia with the patient or authorized representative who has indicated his/her understanding and acceptance.   Dental advisory given  Plan Discussed with:   Anesthesia Plan Comments: (Dr. Jena Gauss made aware of pt's concern for her loose incisors, due to scope passage and bite block.)        Anesthesia Quick Evaluation

## 2012-11-19 NOTE — H&P (View-Only) (Signed)
Primary Care Physician:  [name], MD Primary Gastroenterologist:  Dr. Jena Gauss  Pre-Procedure History & Physical: HPI:  [name] is a 67 y.o. female with a history of metastatic breast cancer here for further evaluation of weight loss epigastric/right upper quadrant abdominal pain and dysphagia. [name] states that she has felt particularly poorly over the past 3 months. She notes not able to eat very much  - fills up early. Complains of intermittent right upper quadrant abdominal pain. 14 pound weight loss the past 3 months. About 40 pounds of weight loss since 2011. She notes intermittent insidiously progressive esophageal dysphagia to solid food and pills. She has not had any melena or rectal bleeding. No nausea or vomiting. Has a history extensive prior chemotherapy and radiation to her chest. Liver enzymes from May of this year were all normal. She historically has takes Protonix 40 mg once daily with good control of her occasional typical reflux symptoms. Last EGD 2011. At that time, an adenomatous duodenal polyp was removed. She had mild gastric mucosal inflammation without H. pylori on biopsies. Last colonoscopy also performed in 2011. Small colonic adenoma removed. Pancolonic diverticulosis. Recent nuclear medicine bone scan and PET scan negative for metastatic disease although she is known to have this problem.   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  No history of recent neutropenia although she has been noted to be mildly leukopenic. 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 She has worked there for 30 years.  Past Medical History  Diagnosis Date  . Cancer     RBreast, Lower back , Under l Arm  . Hypertension   . GERD (gastroesophageal reflux disease)   . Breast cancer metastasized to bone 04/30/2011  . Radiation 01/15/2012    Lumbosacral spine 3500 cGy 14  fx  . Swollen R ankle 08/10/2012   . Fracture of ankle, medial malleolus, right, closed 08/10/2012  . Tubular adenoma 2011  . Hiatal hernia 2011    Past Surgical History  Procedure Laterality Date  . Gallbladder surgery  1991  . Mastectomy  06/28/10    bilateral   . Inguinal hernia repair  90's    right  . Parathyroidectomy  90's  . Carpal tunnel release  90's  . Oophorectomy  90's    right  . Lymph node dissection    . Breast surgery    . Cholecystectomy    . Colonoscopy  01/08/10    Dr. Jena Gauss- anal tag,hemorrhoid o/w normal rectum, pancolonic diverticula, diminutive polyp in the base of the cecum= tubular adenoma on bx. poor prep  . Esophagogastroduodenoscopy  01/08/10    Dr. Jena Gauss- normal esophagus, small hiatal hernia, antum and body erosions, pedunculated polyp between D1 and D2, duodenal diverticulum, lymphangiectasia, second portion of the duodenum.stomach bx= inflammation, duodenum bx= adenoma    Prior to Admission medications   Medication Sig Start Date End Date Taking? Authorizing Provider  cholecalciferol (VITAMIN D) 1000 UNITS tablet Take 2,000 Units by mouth daily.   Yes Historical Provider, MD  diphenoxylate-atropine (LOMOTIL) 2.5-0.025 MG per tablet as needed.  01/23/12  Yes Historical Provider, MD  everolimus (AFINITOR) 7.5 MG tablet Take 1 tablet (7.5 mg total) by mouth daily. Script given to Axel Filler 08/12/12 who faxed it to [facility]. 08/12/12  Yes Levert Feinstein, MD  Homeopathic Products (ARNICA) GEL Apply topically every morning. Feet, legs to the knees   Yes Historical Provider, MD  HYDROcodone-acetaminophen [facility])  10-325 MG per tablet 1-2 tabs po q 4 h prn pain 11/09/12  Yes Levert Feinstein, MD  lisinopril (PRINIVIL,ZESTRIL) 20 MG tablet Take 20 mg by mouth daily.  10/08/10  Yes Historical Provider, MD  mometasone (ELOCON) 0.1 % lotion Apply topically daily as needed. Use as directed 06/29/12  Yes Levert Feinstein, MD  ondansetron (ZOFRAN ODT) 8 MG disintegrating tablet 1 tablet  SL every 6-8 hours and needed for nausea/vomiting. Prescription given to patient 02/03/12  Yes Levert Feinstein, MD  pantoprazole (PROTONIX) 40 MG tablet Take 40 mg by mouth daily as needed. For indigestion 05/04/12  Yes Levert Feinstein, MD  prochlorperazine (COMPAZINE) 10 MG tablet Take 10 mg by mouth every 6 (six) hours as needed.  01/20/12  Yes Historical Provider, MD  tamoxifen (NOLVADEX) 20 MG tablet Take 1 tablet (20 mg total) by mouth daily. Written Script given to pt 05/22/12 05/22/12  Yes Levert Feinstein, MD  warfarin (COUMADIN) 4 MG tablet Take 4 mg by mouth daily. 2 mg MWF 08/16/10  Yes Historical Provider, MD    Allergies as of 11/10/2012 - Review Complete 11/10/2012  Allergen Reaction Noted  . Percodan (oxycodone-aspirin) Nausea Only   . Codeine Nausea Only     Family History  Problem Relation Age of Onset  . Cancer Mother     Colon  . Cancer Father     Lung, Mouth,leg    History   Social History  . Marital Status: Widowed    Spouse Name: N/A    Number of Children: N/A  . Years of Education: N/A   Occupational History  . Not on file.   Social History Main Topics  . Smoking status: Former Smoker    Quit date: 11/22/1982  . Smokeless tobacco: Not on file  . Alcohol Use: No  . Drug Use: Not on file  . Sexually Active: Not on file   Other Topics Concern  . Not on file   Social History Narrative  . No narrative on file    Review of Systems: See HPI, otherwise negative ROS  Physical Exam: BP 140/72  Pulse 70  Temp(Src) 98.1 F (36.7 C) (Oral)  Ht 5\' 8"  (1.727 m)  Wt 158 lb (71.668 kg)  BMI 24.03 kg/m2 General:   Alert, comfortable,-appearing lady pleasant resting without acute distress  Skin:  Intact without significant lesions or rashes. Eyes:  Sclera clear, no icterus.   Conjunctiva pink. Ears:  Normal auditory acuity. Nose:  No deformity, discharge,  or lesions. Mouth:  No deformity or lesions. Neck:  Supple; no masses or thyromegaly. No  significant cervical adenopathy. Lungs:  Clear throughout to auscultation.   No wheezes, crackles, or rhonchi. No acute distress. Heart:  Regular rate and rhythm; no murmurs, clicks, rubs,  or gallops. Abdomen: Full. Positive bowel sounds. Soft with some epigastric tenderness. No obvious mass or organomegaly. Pulses:  Normal pulses noted. Extremities:  Without clubbing or edema.  Impression/Plan:  Pleasant 67 year old lady with a complicated history of metastatic breast carcinoma now with insidious onset of progressing weight loss early satiety, epigastric and right upper quadrant abdominal pain along with esophageal dysphagia. History of chest radiation. Chronic GERD symptoms well controlled on acid suppression therapy. Gallbladder is out. Recent liver enzymes normal. Recent imaging including a PET scan failed to demonstrate any compelling evidence for any intra-abdominal neoplastic process.  History of a duodenal adenoma removed previously.  She gives an impressive history of progressive esophageal dysphagia. I suppose she  could have radiation induced stricture. A benign peptic stricture is also in the differential. I doubt we are dealing with a neoplastic process causing her GI symptoms, per say. Symptoms would be atypical for peptic ulcer disease.  Recommendations: I offered this lady both a diagnostic and therapeutic EGD in the near future.  The risks, benefits, limitations, alternatives and imponderables have been reviewed with the patient. Potential for esophageal dilation, biopsy, etc. have also been reviewed.  Questions have been answered. All parties agreeable.  I have communicated with Dr. Cyndie Chime. He recommended it would be just fine to stop her Coumadin 4 days prior to procedure without any bridging maneuvers.  Further recommendations to follow.  I'd like to thank  Dr. Cyndie Chime for allowing me to see this nice lady once again.

## 2012-11-19 NOTE — Interval H&P Note (Signed)
History and Physical Interval Note:  11/19/2012 9:00 AM  [name]  has presented today for surgery, with the diagnosis of DYSPHAGIA, Right Upper Quadrdant Abdominal PAIN  The various methods of treatment have been discussed with the patient and family. After consideration of risks, benefits and other options for treatment, the patient has consented to  Procedure(s) with comments: ESOPHAGOGASTRODUODENOSCOPY (EGD) WITH PROPOFOL (N/A) - 8:45am MALONEY DILATION (N/A) SAVORY DILATION (N/A) as a surgical intervention .  The patient's history has been reviewed, patient examined, no change in status, stable for surgery.  I have reviewed the patient's chart and labs.  Questions were answered to the patient's satisfaction.     [name]  No change. EGD with possible esophageal dilation, etc. today. Patient has 2 loose incisors. Anesthesia aware as well.The risks, benefits, limitations, alternatives and imponderables have been reviewed with the patient. Potential for esophageal dilation, biopsy, etc. have also been reviewed.  Questions have been answered. All parties agreeable.

## 2012-11-19 NOTE — Transfer of Care (Signed)
Immediate Anesthesia Transfer of Care Note  Patient: [name]  Procedure(s) Performed: Procedure(s) (LRB): ESOPHAGOGASTRODUODENOSCOPY (EGD) WITH PROPOFOL (N/A) MALONEY DILATION (N/A) POLYPECTOMY (N/A) BIOPSY (N/A)  Patient Location: PACU  Anesthesia Type: MAC  Level of Consciousness: awake  Airway & Oxygen Therapy: Patient Spontanous Breathing.   Post-op Assessment: Report given to PACU RN, Post -op Vital signs reviewed and stable and Patient moving all extremities  Post vital signs: Reviewed and stable  Complications: No apparent anesthesia complications

## 2012-11-19 NOTE — Op Note (Signed)
[facility] [address] [place], [address]   ENDOSCOPY PROCEDURE REPORT  PATIENT: Carolyn, [name]  MR#: [id number] BIRTHDATE: [birth year] , 67  yrs. old GENDER: Female ENDOSCOPIST: R.  Roetta Sessions, MD FACP FACG REFERRED BY:  Quintin Alto, M.D.  Cephas Darby, M.D. PROCEDURE DATE:  11/19/2012 PROCEDURE:     EGD with Elease Hashimoto patient followed by cap, saline-assisted duodenal polypectomy, gastric polypectomy and esophageal biopsy  INDICATIONS:     Early satiety; epigastric and right upper quadrant pain. Esophageal dysphagia.  INFORMED CONSENT:   The risks, benefits, limitations, alternatives and imponderables have been discussed.  The potential for biopsy, esophogeal dilation, etc. have also been reviewed.  Questions have been answered.  All parties agreeable.  Please see the history and physical in the medical record for more information.  MEDICATIONS:    Deep sedation per Dr. [facility]  DESCRIPTION OF PROCEDURE:   The     endoscope was introduced through the mouth and advanced to the second portion of the duodenum without difficulty or limitations.  The mucosal surfaces were surveyed very carefully during advancement of the scope and upon withdrawal.  Retroflexion view of the proximal stomach and esophagogastric junction was performed.      FINDINGS: Noncritical Schatzki's ring otherwise normal-appearing esophagus. Stomach empty. Small hiatal hernia. Multiple gastric polyps largest - 6 mm in dimensions. Otherwise, the gastric mucosa appeared normal. Patent pylorus. Examination of the bulb and second portion revealed a sprawling 1.5 - 2 cm sessile polypoid lesion beginning in the second portion of the duodenum just beyond the bulb THERAPEUTIC / DIAGNOSTIC MANEUVERS PERFORMED:  A 54 French Maloney dilator was passed to full insertion easily. A look back revealed her ring dilated/ruptured nicely. Subsequently, attention was turned  to the duodenal polyp. It was difficult to access. Ultimately I injected approximately 3 cc of normal saline around the base to lift this polypoid lesion away from the duodenal wall. Again, is a difficult approach. Subsequently, put a plastic banding cap on the end of the scope and reintroduced into the duodenum this gave me much improved visualization/approach. Utilizing hot snare cautery. This lesion was piecemeal removed the vast majority of it was felt to have been removed although some of this lesion could persist. Good hemostasis maintained with no apparent complication with this maneuver. Subsequently, one of the gastric polyps was resected with hot snare cautery technique. Finally, the mid and distal esophagus was biopsied to rule out coexisting eosinophilic esophagitis.   COMPLICATIONS:  None  IMPRESSION:  Schatzki's ring-dilated as described above. Duodenal polyp status post  cap and saline assisted piecemeal snare polypectomy. Status post gastric snare polypectomy Status post soccer biopsy.  RECOMMENDATIONS:   Do not resume Coumadin until July 31 (no sooner due to increased risk of bleeding) Take your Protonix 40 mg every day without fail.   Followup on pathology. Use Chloraseptic spray for any transient sore throat you may experience.    _______________________________ R. Roetta Sessions, MD FACP [facility] eSigned:  R. Roetta Sessions, MD FACP [facility] 11/19/2012 10:52 AM     CC:  PATIENT NAME:  Carolyn, [name] MR#: [id number]

## 2012-11-20 ENCOUNTER — Encounter: Payer: Self-pay | Admitting: Internal Medicine

## 2012-11-23 ENCOUNTER — Encounter: Payer: Self-pay | Admitting: Internal Medicine

## 2012-11-23 ENCOUNTER — Encounter (HOSPITAL_COMMUNITY): Payer: Self-pay | Admitting: Internal Medicine

## 2012-11-25 ENCOUNTER — Telehealth: Payer: Self-pay | Admitting: General Practice

## 2012-11-25 DIAGNOSIS — Z86718 Personal history of other venous thrombosis and embolism: Secondary | ICD-10-CM

## 2012-11-25 NOTE — Telephone Encounter (Signed)
Patient called and stated she thinks she has a blood clot in her leg.  She stated she is suppose to start back on her Coumadin tomorrow.  She has had this problem before and is very concerned since she has been off of her Coumadin.    She's has pain in her left leg along with swelling.  Routed call to Hendricks Limes, LPN

## 2012-11-25 NOTE — Telephone Encounter (Signed)
Spoke with pt- she has had swelling x2 days. Spoke with RMR- he recommends she restart her coumadin today, keep leg elevated, have L lower leg doppler tomorrow morning and if she gets worse go to ED.   She has not received her results letter yet, informed her of results. Her swallowing is not any better, she had an episode this morning when trying to swallow cola.   Doppler is scheduled at [facility] for 8:30 in the morning.  Called pt and she is aware of her appt at [facility].

## 2012-11-25 NOTE — Telephone Encounter (Signed)
Pt is aware that they are going to work her in for this appointment and that she may have to wait.

## 2012-11-26 ENCOUNTER — Ambulatory Visit (HOSPITAL_COMMUNITY)
Admission: RE | Admit: 2012-11-26 | Discharge: 2012-11-26 | Disposition: A | Discharge status: Home / Self Care | Payer: Medicare PPO | Source: Ambulatory Visit | Attending: Internal Medicine | Admitting: Internal Medicine

## 2012-11-26 ENCOUNTER — Other Ambulatory Visit: Payer: Self-pay | Admitting: Gastroenterology

## 2012-11-26 ENCOUNTER — Other Ambulatory Visit: Payer: Self-pay

## 2012-11-26 ENCOUNTER — Encounter: Payer: Self-pay | Admitting: Gastroenterology

## 2012-11-26 ENCOUNTER — Encounter: Payer: Self-pay | Admitting: Internal Medicine

## 2012-11-26 DIAGNOSIS — Z86718 Personal history of other venous thrombosis and embolism: Secondary | ICD-10-CM

## 2012-11-26 DIAGNOSIS — Z79899 Other long term (current) drug therapy: Secondary | ICD-10-CM

## 2012-11-26 DIAGNOSIS — M79609 Pain in unspecified limb: Secondary | ICD-10-CM | POA: Insufficient documentation

## 2012-11-26 DIAGNOSIS — I8000 Phlebitis and thrombophlebitis of superficial vessels of unspecified lower extremity: Secondary | ICD-10-CM | POA: Insufficient documentation

## 2012-11-26 DIAGNOSIS — M7989 Other specified soft tissue disorders: Secondary | ICD-10-CM | POA: Insufficient documentation

## 2012-11-26 MED ORDER — ENOXAPARIN SODIUM 100 MG/ML ~~LOC~~ SOLN: 100.0000 mg | Freq: Every day | SUBCUTANEOUS | Status: DC

## 2012-11-26 NOTE — Progress Notes (Signed)
67 year old female with a history of metastatic breast cancer, epigastric/RUQ pain, and dysphagia, recently underwent upper endoscopy with Dr. Jena Gauss on July 24th. Findings of multiple gastric polyps and duodenal polyp s/p piecemeal polypectomy. Path benign. Non-critical Schatzki's ring s/p dilation with 54 F Maloney.  [name] called our office on July 30th with complaints of left leg swelling and pain. Duplex ultrasound completed as soon as possible (this morning), with evidence of superficial thrombophlebitis within left calf varicosities but NO DVT. I met her in the lobby of the radiology department to review findings with her and assess her progress since the procedure.   She denies any abdominal pain, nausea, or vomiting. Reports drinking Pepsi yesterday with a sensation of liquid "caught" in her esophagus. Denies solid food dysphagia.  She restarted Coumadin 4 mg yesterday evening, and she wonders what dosage she needs to continue.   Examination of bilateral lower extremities reveals no significant change in calf size, both appear similar. Below the knee of the left leg and to the right of the shin reveals mild erythema  and very mild edema, consistent with superficial thrombophlebitis. No calf edema bilaterally, no calf pain.   Studies/Results: US Venous Img Lower Unilateral Left  11/26/2012   *RADIOLOGY REPORT*  Clinical Data: swelling and pain;;  LEFT LOWER EXTREMITY VENOUS DUPLEX ULTRASOUND  Technique: Gray-scale sonography with compression, as well as color and duplex ultrasound, were performed to evaluate the deep venous system from the level of the common femoral vein through the popliteal and proximal calf veins.  Comparison: None  Findings:  Normal compressibility and normal Doppler signal within the common femoral, superficial femoral and popliteal veins, down to the proximal calf veins.  No grayscale filling defects to suggest DVT.  Occlusive thrombus noted within the left calf  varicosities.  Complex fluid collection in the popliteal fossa measuring up to 3.5 cm compatible with Baker's cyst.  IMPRESSION: No evidence of left lower extremity deep vein thrombosis.  Superficial thrombophlebitis within the left calf varicosities.  Left popliteal fossa Baker's cyst.   Original Report Authenticated By: Charlett Nose, M.D.    Will check INR now. Coumadin 4 mg tonight. Discussed Lovenox BID dosing, but patient is declining this frequency due to work schedule. With her extensive history, bridging Lovenox/treatment for current thrombophlebitis is warranted. She is agreeable to once daily dosing.   After review of up-to-date, for bridging purposes we will use the therapeutic dosing of Lovenox. This will need to be 1.5mg /kg daily, as she desires to do once daily dosing. Treatment for thrombophlebitis would be Lovenox 40 mg daily, so this will be more than sufficient for bridging and treatment.   Discussed with pharmacist at [facility] in [place] on [address]. With the weight-based dosing, it will be best to use the 100 mg syringes once daily. The pharmacy has these in stock.   Patient to begin Lovenox 100 mg subcutaneous each evening for 5 days. INR now. Continue Coumadin 4 mg each evening for now. Further recommendations after INR reviewed.   [name]: please contact patient to see when would be a good time for her to return to clinic to see Dr. Jena Gauss. She is unable to do the Aug 15th time due to her work schedule. She wants to hold off on a BPE currently, which I feel is reasonable. Continue Protonix BID. May have underlying esophageal motility disorder.

## 2012-12-02 ENCOUNTER — Other Ambulatory Visit: Payer: Self-pay

## 2012-12-07 ENCOUNTER — Other Ambulatory Visit: Payer: Self-pay | Admitting: *Deleted

## 2012-12-07 NOTE — Telephone Encounter (Signed)
THIS REFILL REQUEST FOR AFINITOR WAS PLACED IN DR.GRANFORTUNA'S ACTIVE WORK BOX.

## 2012-12-08 ENCOUNTER — Telehealth: Payer: Self-pay | Admitting: *Deleted

## 2012-12-08 ENCOUNTER — Other Ambulatory Visit: Payer: Self-pay | Admitting: *Deleted

## 2012-12-08 DIAGNOSIS — C50919 Malignant neoplasm of unspecified site of unspecified female breast: Secondary | ICD-10-CM

## 2012-12-08 DIAGNOSIS — C7952 Secondary malignant neoplasm of bone marrow: Secondary | ICD-10-CM

## 2012-12-08 MED ORDER — EVEROLIMUS 7.5 MG PO TABS: 7.5000 mg | ORAL_TABLET | Freq: Every day | ORAL | Status: DC

## 2012-12-08 NOTE — Telephone Encounter (Signed)
Received vm from [employer] stating that affinitor comes in packages of # 28 & wants to know if OK to dispense # 28 instead of # 30.  Returned call & OK given.

## 2012-12-09 NOTE — Progress Notes (Signed)
Pt on schedule for 8/15 with RMR- Darl Pikes, will you check with pt and see if she can or cannot come on this day and if she cant will you reschedule. Thanks.

## 2012-12-10 ENCOUNTER — Encounter: Payer: Self-pay | Admitting: Gastroenterology

## 2012-12-10 NOTE — Progress Notes (Signed)
LMOM for patient to return my call. Asking if she was still able to come tomorrow to see RMR or if we needed to [facility].

## 2012-12-10 NOTE — Progress Notes (Signed)
Pt decided to [facility] and is aware of new OV wth RMR on 9/17 at 4

## 2012-12-11 ENCOUNTER — Ambulatory Visit: Payer: Medicare PPO | Admitting: Internal Medicine

## 2012-12-15 ENCOUNTER — Other Ambulatory Visit (HOSPITAL_COMMUNITY): Payer: Medicare PPO

## 2012-12-15 ENCOUNTER — Encounter (HOSPITAL_COMMUNITY)
Admission: RE | Admit: 2012-12-15 | Discharge: 2012-12-15 | Disposition: A | Discharge status: Home / Self Care | Payer: Medicare PPO | Source: Ambulatory Visit | Attending: Oncology | Admitting: Oncology

## 2012-12-15 ENCOUNTER — Other Ambulatory Visit: Payer: Self-pay | Admitting: Oncology

## 2012-12-15 ENCOUNTER — Ambulatory Visit (HOSPITAL_COMMUNITY)
Admission: RE | Admit: 2012-12-15 | Discharge: 2012-12-15 | Disposition: A | Discharge status: Home / Self Care | Payer: Medicare PPO | Source: Ambulatory Visit | Attending: Oncology | Admitting: Oncology

## 2012-12-15 ENCOUNTER — Ambulatory Visit (HOSPITAL_COMMUNITY): Payer: Medicare PPO

## 2012-12-15 ENCOUNTER — Encounter (HOSPITAL_COMMUNITY): Payer: Medicare PPO

## 2012-12-15 ENCOUNTER — Other Ambulatory Visit (HOSPITAL_BASED_OUTPATIENT_CLINIC_OR_DEPARTMENT_OTHER): Payer: Medicare PPO | Admitting: Lab

## 2012-12-15 ENCOUNTER — Other Ambulatory Visit: Payer: Medicare PPO | Admitting: Lab

## 2012-12-15 DIAGNOSIS — C50919 Malignant neoplasm of unspecified site of unspecified female breast: Secondary | ICD-10-CM

## 2012-12-15 DIAGNOSIS — I808 Phlebitis and thrombophlebitis of other sites: Secondary | ICD-10-CM

## 2012-12-15 DIAGNOSIS — D1809 Hemangioma of other sites: Secondary | ICD-10-CM | POA: Insufficient documentation

## 2012-12-15 DIAGNOSIS — M545 Low back pain, unspecified: Secondary | ICD-10-CM | POA: Insufficient documentation

## 2012-12-15 DIAGNOSIS — M25559 Pain in unspecified hip: Secondary | ICD-10-CM | POA: Insufficient documentation

## 2012-12-15 DIAGNOSIS — M25519 Pain in unspecified shoulder: Secondary | ICD-10-CM | POA: Insufficient documentation

## 2012-12-15 DIAGNOSIS — C7951 Secondary malignant neoplasm of bone: Secondary | ICD-10-CM | POA: Insufficient documentation

## 2012-12-15 DIAGNOSIS — M5020 Other cervical disc displacement, unspecified cervical region: Secondary | ICD-10-CM | POA: Insufficient documentation

## 2012-12-15 DIAGNOSIS — M47814 Spondylosis without myelopathy or radiculopathy, thoracic region: Secondary | ICD-10-CM | POA: Insufficient documentation

## 2012-12-15 DIAGNOSIS — K7689 Other specified diseases of liver: Secondary | ICD-10-CM | POA: Insufficient documentation

## 2012-12-15 DIAGNOSIS — M5126 Other intervertebral disc displacement, lumbar region: Secondary | ICD-10-CM | POA: Insufficient documentation

## 2012-12-15 LAB — COMPREHENSIVE METABOLIC PANEL (CC13)
ALT: 17 U/L (ref 0–55)
AST: 25 U/L (ref 5–34)
Albumin: 3.2 g/dL — ABNORMAL LOW (ref 3.5–5.0)
Alkaline Phosphatase: 77 U/L (ref 40–150)
Glucose: 117 mg/dl (ref 70–140)
Potassium: 3.6 mEq/L (ref 3.5–5.1)
Sodium: 145 mEq/L (ref 136–145)
Total Protein: 6.5 g/dL (ref 6.4–8.3)

## 2012-12-15 LAB — CBC WITH DIFFERENTIAL/PLATELET
Eosinophils Absolute: 0 10*3/uL (ref 0.0–0.5)
HCT: 34.3 % — ABNORMAL LOW (ref 34.8–46.6)
LYMPH%: 10.4 % — ABNORMAL LOW (ref 14.0–49.7)
MONO#: 0.5 10*3/uL (ref 0.1–0.9)
NEUT#: 2.9 10*3/uL (ref 1.5–6.5)
Platelets: 220 10*3/uL (ref 145–400)
RBC: 4.59 10*6/uL (ref 3.70–5.45)
WBC: 4 10*3/uL (ref 3.9–10.3)

## 2012-12-15 LAB — PROTIME-INR: Protime: 25.2 Seconds — ABNORMAL HIGH (ref 10.6–13.4)

## 2012-12-15 MED ORDER — GADOBENATE DIMEGLUMINE 529 MG/ML IV SOLN
15.0000 mL | Freq: Once | INTRAVENOUS | Status: AC | PRN
  Administered 2012-12-15: 15 mL via INTRAVENOUS

## 2012-12-15 MED ORDER — TECHNETIUM TC 99M MEDRONATE IV KIT
25.0000 | PACK | Freq: Once | INTRAVENOUS | Status: AC | PRN
  Administered 2012-12-15: 25 via INTRAVENOUS

## 2012-12-17 ENCOUNTER — Other Ambulatory Visit (HOSPITAL_COMMUNITY): Payer: Medicare PPO

## 2012-12-22 ENCOUNTER — Ambulatory Visit (HOSPITAL_BASED_OUTPATIENT_CLINIC_OR_DEPARTMENT_OTHER): Payer: Medicare PPO | Admitting: Oncology

## 2012-12-22 ENCOUNTER — Telehealth: Payer: Self-pay | Admitting: Oncology

## 2012-12-22 VITALS — BP 142/81 | HR 72 | Temp 98.1°F | Resp 18 | Ht 68.0 in | Wt 154.1 lb

## 2012-12-22 DIAGNOSIS — C50919 Malignant neoplasm of unspecified site of unspecified female breast: Secondary | ICD-10-CM

## 2012-12-22 DIAGNOSIS — C7951 Secondary malignant neoplasm of bone: Secondary | ICD-10-CM

## 2012-12-22 MED ORDER — HYDROCODONE-ACETAMINOPHEN 10-325 MG PO TABS: 1.0000 | ORAL_TABLET | ORAL | Status: DC | PRN

## 2012-12-22 MED ORDER — HYDROCODONE-ACETAMINOPHEN 10-325 MG PO TABS: 1.0000 | ORAL_TABLET | Freq: Four times a day (QID) | ORAL | Status: DC | PRN

## 2012-12-22 NOTE — Telephone Encounter (Signed)
gv and printed appt sched and avs for pt  °

## 2012-12-24 NOTE — Progress Notes (Signed)
Hematology and Oncology Follow Up Visit  [name] [id number] 28-Apr-1946 67 y.o. 12/24/2012 6:58 PM   Principle Diagnosis: Encounter Diagnosis  Name Primary?  . 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 This tumor was ER negative. While under evaluation, she developed another primary in her right breast which was ER positive. She underwent chemotherapy, bilateral mastectomies, details summarized in my 11/09/2012 note. She developed a right chest wall recurrence treated with radiation. She had further progression in bone in August 2013. She received palliative radiation to the lumbosacral spine and was then put on single agent oral Xeloda chemotherapy. She progressed again in bone in January 2014 and was on a combination of tamoxifen plus Affinitor. Current dose 7.5 mg daily on the Affinitor and 20 mg of tamoxifen. She has had stable disease on this combination. Time the Affinitor/tamoxifen combination was started tumor marker was upper normal at 38 units, went to 40 units by March, 49 units in April, with current trend in the opposite direction with most recent value 41 done on 12/15/2012. She was recently restaged with a bone scan and MRIs of the spine. A PET scan done in 09/02/2012 did not show any obvious hypermetabolic activity. Bone scan has not been a reliable indicator of her disease activity. 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 MRI done on August 19 he  continues to show extensive involvement in the thoracic spine, moderate involvement of the lumbar spine, and questionable small lesion in the cervical spine, little changed from prior study done 05/12/2012. In addition to the cancer, she has disc disease, with disk extrusions at multiple levels in the thoracic spine with a left paracentral disc protrusion at L3-4 which is slightly larger. She is still getting back pain with moderate pain control on Tylox which is the only narcotic that she can tolerate. She reports no new areas of pain.  She had recent reevaluation of her GI tract with upper endoscopy for complaints of dysphagia and epigastric abdominal pain. She had a dilatation procedure without much change in her symptoms but did improve when her PPI was again increased to twice daily.      Medications: reviewed  Allergies:  Allergies  Allergen Reactions  . Aspirin   . Percodan [Oxycodone-Aspirin] Nausea Only  . Codeine Nausea Only    Review of Systems: Constitutional:   Chronic fatigue but she continues to work full-time. Appetite is poor. Respiratory: No cough or dyspnea Cardiovascular:  No chest pain or palpitations Gastrointestinal: No abdominal pain or change in bowel habit Genito-Urinary: No urinary tract symptoms Musculoskeletal: See above Neurologic: No headache or change in vision Skin: No rash or ecchymosis Remaining ROS negative.  Physical Exam: Blood pressure 142/81, pulse 72, temperature 98.1 F (36.7 C), temperature source Oral, resp. rate 18, height 5\' 8"  (1.727 m), weight 154 lb 1.6 oz (69.899 kg). Wt Readings from Last 3 Encounters:  12/22/12 154 lb 1.6 oz (69.899 kg)  11/10/12 158 lb (71.668 kg)  11/09/12 156 lb  11.2 oz (71.079 kg)     General appearance: She is losing weight. Down from 173 pounds in January to current weight 154. HENNT: Pharynx no erythema or exudate Lymph nodes: No adenopathy Breasts: Bilateral mastectomies. No new chest wall  lesions Lungs: Clear to auscultation resonant to percussion Heart: Regular rhythm no murmur Abdomen: Soft, nontender, no mass, no organomegaly Extremities: No edema, no calf tenderness Musculoskeletal: No joint deformities GU: Vascular: No cyanosis Neurologic: Motor strength 5 over 5, reflexes 1+ symmetric Skin: No rash or ecchymosis  Lab Results: Lab Results  Component Value Date   WBC 4.0 12/15/2012   HGB 11.3* 12/15/2012   HCT 34.3* 12/15/2012   MCV 74.7* 12/15/2012   PLT 220 12/15/2012     Chemistry      Component Value Date/Time   NA 145 12/15/2012 1151   NA 141 09/24/2011 1420   K 3.6 12/15/2012 1151   K 4.0 09/24/2011 1420   CL 110* 09/02/2012 0814   CL 104 09/24/2011 1420   CO2 24 12/15/2012 1151   CO2 26 09/24/2011 1420   BUN 15.1 12/15/2012 1151   BUN 17 09/24/2011 1420   CREATININE 0.7 12/15/2012 1151   CREATININE 0.58 09/24/2011 1420      Component Value Date/Time   CALCIUM 8.5 12/15/2012 1151   CALCIUM 9.4 09/24/2011 1420   ALKPHOS 77 12/15/2012 1151   ALKPHOS 78 09/24/2011 1420   AST 25 12/15/2012 1151   AST 18 09/24/2011 1420   ALT 17 12/15/2012 1151   ALT 16 09/24/2011 1420   BILITOT 0.35 12/15/2012 1151   BILITOT 0.4 09/24/2011 1420       Radiological Studies: Dg Chest 2 View  12/15/2012   *RADIOLOGY REPORT*  Clinical Data: Metastatic breast cancer  CHEST - 2 VIEW  Comparison: April 15, 2012.  Findings: Stable cardiomediastinal silhouette.  Surgical clips are seen in both axillary regions.  No acute pulmonary disease is noted.  No pneumothorax or pleural effusion is noted.  Bony thorax is intact.  IMPRESSION: No acute cardiopulmonary abnormality seen.   Original Report Authenticated By: Lupita Raider.,  M.D.   Mr Cervical Spine W Wo Contrast  12/15/2012   CLINICAL DATA:  Metastatic breast cancer with diffuse spinal, right shoulder and hip pain. Evaluate for disease progression.  EXAM: MRI CERVICAL, THORACIC AND LUMBAR SPINE WITHOUT AND WITH CONTRAST  TECHNIQUE:  Multiplanar and multiecho pulse sequences of the cervical spine, to include the craniocervical junction and cervicothoracic junction, and thoracic and lumbar spine, were obtained without and with intravenous contrast.  CONTRAST:  15mL MULTIHANCE GADOBENATE DIMEGLUMINE 529 MG/ML IV SOLN  COMPARISON:  Whole-body bone scan 09/14/2012, PET-CT 09/02/2012 and thoracolumbar MRI 05/12/2012.  FINDINGS: MRI CERVICAL SPINE FINDINGS  There is a possible small metastasis within the left aspect of the C3 vertebral body, best seen on the sagittal images. No other definite osseous metastases are seen within the cervical spine. There is a hemangioma within the C6 vertebral body. The cervical alignment is near anatomic.  The craniocervical junction appears normal. The cervical cord is normal in signal and caliber. No abnormal intradural enhancement is seen.  There is disk bulging throughout the cervical spine with a central disc protrusion at C3-4. No cord deformity or significant foraminal compromise is identified. There are no significant paraspinal findings.  MRI THORACIC SPINE FINDINGS  Multifocal metastases are again noted throughout the thoracic spine. The largest lesion is at T3, measuring up to 1.6 cm in diameter on the sagittal images. A  few of the lesions appear slightly larger, but overall, the disease extent is unchanged. Post-contrast images demonstrate increased enhancement of lesions within the T3, T10, T11 and T12 vertebral bodies. No pathologic fractures are identified.  There is no abnormal intradural or epidural enhancement. There is no cord compression. Disk extrusions at the T5-6, T6-7 and T7-8 levels do not appear significantly changed.  Multiple cystic lesions throughout the liver and moderate extrahepatic biliary dilatation also appear grossly unchanged.  MRI LUMBAR SPINE FINDINGS  The overall lumbar alignment is unchanged. Radiation changes are noted throughout the marrow from L3 through the upper sacrum.  Enhancing metastases within the T11, T12 and L1 vertebral bodies are unchanged. Heterogeneous signal throughout the L4 vertebral body is also unchanged with only residual low-level enhancement. There is no evidence of epidural tumor or pathologic fracture. Mild paraspinous edema at L3-4 is unchanged.  There is no abnormal intradural enhancement or contents compression.  Left paracentral disc protrusion at L3-4 appears slightly larger, but does not cause any nerve root encroachment.  IMPRESSION: Little overall change in the extent of multifocal metastatic disease throughout the thoracolumbar spine. There is increased enhancement of the lesions within the T3, T10, T11 and T12 vertebral bodies. No pathologic fractures or significant epidural tumor identified.  The multilevel spondylosis is largely unchanged with disk extrusions at T5-6, T6-7 and T7-8. A left paracentral disc protrusion at L3-4 is slightly larger.   Electronically Signed   By: Roxy Horseman   On: 12/15/2012 11:22   Mr Thoracic Spine W Wo Contrast  12/15/2012   CLINICAL DATA:  Metastatic breast cancer with diffuse spinal, right shoulder and hip pain. Evaluate for disease progression.  EXAM: MRI CERVICAL, THORACIC AND LUMBAR SPINE WITHOUT AND WITH CONTRAST  TECHNIQUE: Multiplanar and multiecho pulse sequences of the cervical spine, to include the craniocervical junction and cervicothoracic junction, and thoracic and lumbar spine, were obtained without and with intravenous contrast.  CONTRAST:  15mL MULTIHANCE GADOBENATE DIMEGLUMINE 529 MG/ML IV SOLN  COMPARISON:  Whole-body bone scan 09/14/2012, PET-CT 09/02/2012 and thoracolumbar MRI 05/12/2012.  FINDINGS: MRI CERVICAL SPINE FINDINGS  There is a possible small metastasis within the left aspect of the C3 vertebral body, best seen on the sagittal images. No other definite osseous metastases are seen within the cervical spine. There is a hemangioma within the C6 vertebral body. The cervical alignment is  near anatomic.  The craniocervical junction appears normal. The cervical cord is normal in signal and caliber. No abnormal intradural enhancement is seen.  There is disk bulging throughout the cervical spine with a central disc protrusion at C3-4. No cord deformity or significant foraminal compromise is identified. There are no significant paraspinal findings.  MRI THORACIC SPINE FINDINGS  Multifocal metastases are again noted throughout the thoracic spine. The largest lesion is at T3, measuring up to 1.6 cm in diameter on the sagittal images. A few of the lesions appear slightly larger, but overall, the disease extent is unchanged. Post-contrast images demonstrate increased enhancement of lesions within the T3, T10, T11 and T12 vertebral bodies. No pathologic fractures are identified.  There is no abnormal intradural or epidural enhancement. There is no cord compression. Disk extrusions at the T5-6, T6-7 and T7-8 levels do not appear significantly changed.  Multiple cystic lesions throughout the liver and moderate extrahepatic biliary dilatation also appear grossly unchanged.  MRI LUMBAR SPINE FINDINGS  The overall lumbar alignment is unchanged. Radiation changes are noted throughout the marrow from L3 through the upper sacrum. Enhancing metastases  within the T11, T12 and L1 vertebral bodies are unchanged. Heterogeneous signal throughout the L4 vertebral body is also unchanged with only residual low-level enhancement. There is no evidence of epidural tumor or pathologic fracture. Mild paraspinous edema at L3-4 is unchanged.  There is no abnormal intradural enhancement or contents compression.  Left paracentral disc protrusion at L3-4 appears slightly larger, but does not cause any nerve root encroachment.  IMPRESSION: Little overall change in the extent of multifocal metastatic disease throughout the thoracolumbar spine. There is increased enhancement of the lesions within the T3, T10, T11 and T12 vertebral bodies.  No pathologic fractures or significant epidural tumor identified.  The multilevel spondylosis is largely unchanged with disk extrusions at T5-6, T6-7 and T7-8. A left paracentral disc protrusion at L3-4 is slightly larger.   Electronically Signed   By: Roxy Horseman   On: 12/15/2012 11:22   Mr Lumbar Spine W Wo Contrast  12/15/2012   CLINICAL DATA:  Metastatic breast cancer with diffuse spinal, right shoulder and hip pain. Evaluate for disease progression.  EXAM: MRI CERVICAL, THORACIC AND LUMBAR SPINE WITHOUT AND WITH CONTRAST  TECHNIQUE: Multiplanar and multiecho pulse sequences of the cervical spine, to include the craniocervical junction and cervicothoracic junction, and thoracic and lumbar spine, were obtained without and with intravenous contrast.  CONTRAST:  15mL MULTIHANCE GADOBENATE DIMEGLUMINE 529 MG/ML IV SOLN  COMPARISON:  Whole-body bone scan 09/14/2012, PET-CT 09/02/2012 and thoracolumbar MRI 05/12/2012.  FINDINGS: MRI CERVICAL SPINE FINDINGS  There is a possible small metastasis within the left aspect of the C3 vertebral body, best seen on the sagittal images. No other definite osseous metastases are seen within the cervical spine. There is a hemangioma within the C6 vertebral body. The cervical alignment is near anatomic.  The craniocervical junction appears normal. The cervical cord is normal in signal and caliber. No abnormal intradural enhancement is seen.  There is disk bulging throughout the cervical spine with a central disc protrusion at C3-4. No cord deformity or significant foraminal compromise is identified. There are no significant paraspinal findings.  MRI THORACIC SPINE FINDINGS  Multifocal metastases are again noted throughout the thoracic spine. The largest lesion is at T3, measuring up to 1.6 cm in diameter on the sagittal images. A few of the lesions appear slightly larger, but overall, the disease extent is unchanged. Post-contrast images demonstrate increased enhancement of  lesions within the T3, T10, T11 and T12 vertebral bodies. No pathologic fractures are identified.  There is no abnormal intradural or epidural enhancement. There is no cord compression. Disk extrusions at the T5-6, T6-7 and T7-8 levels do not appear significantly changed.  Multiple cystic lesions throughout the liver and moderate extrahepatic biliary dilatation also appear grossly unchanged.  MRI LUMBAR SPINE FINDINGS  The overall lumbar alignment is unchanged. Radiation changes are noted throughout the marrow from L3 through the upper sacrum. Enhancing metastases within the T11, T12 and L1 vertebral bodies are unchanged. Heterogeneous signal throughout the L4 vertebral body is also unchanged with only residual low-level enhancement. There is no evidence of epidural tumor or pathologic fracture. Mild paraspinous edema at L3-4 is unchanged.  There is no abnormal intradural enhancement or contents compression.  Left paracentral disc protrusion at L3-4 appears slightly larger, but does not cause any nerve root encroachment.  IMPRESSION: Little overall change in the extent of multifocal metastatic disease throughout the thoracolumbar spine. There is increased enhancement of the lesions within the T3, T10, T11 and T12 vertebral bodies. No pathologic fractures or  significant epidural tumor identified.  The multilevel spondylosis is largely unchanged with disk extrusions at T5-6, T6-7 and T7-8. A left paracentral disc protrusion at L3-4 is slightly larger.   Electronically Signed   By: Roxy Horseman   On: 12/15/2012 11:22   Nm Bone Scan Whole Body  12/15/2012   *RADIOLOGY REPORT*  Clinical Data: Right breast cancer, lumbar pain, right shoulder and hip pain  NUCLEAR MEDICINE WHOLE BODY BONE SCINTIGRAPHY  Technique:  Whole body anterior and posterior images were obtained approximately 3 hours after intravenous injection of radiopharmaceutical.  Radiopharmaceutical: CURIE TC-MDP TECHNETIUM TC 65M MEDRONATE IV KIT   Comparison: 09/14/2012 Correlation:  MRI cervical/thoracic/lumbar spine 12/15/2012  Findings: Uptake is identified at right lateral aspect of the lower lumbar spine at L4-L5 likely related to degenerative disc disease changes present on MR and unchanged from prior exam. Nonspecific uptake at right first MTP joint, left knee joint medially, and mid left foot, typically degenerative or arthritic. No other abnormal sites of abnormal osseous tracer accumulation are identified. Expected urinary tract and soft tissue distribution of tracer.  IMPRESSION: Scattered degenerative type uptake in the lower lumbar spine, left knee, and bilateral feet as above. No definite sites of abnormal osseous tracer accumulation are identified which are scintigraphically suspicious for osseous metastatic disease. No significant interval change.   Original Report Authenticated By: Ulyses Southward, M.D.   US Venous Img Lower Unilateral Left  11/26/2012   *RADIOLOGY REPORT*  Clinical Data: swelling and pain;;  LEFT LOWER EXTREMITY VENOUS DUPLEX ULTRASOUND  Technique: Gray-scale sonography with compression, as well as color and duplex ultrasound, were performed to evaluate the deep venous system from the level of the common femoral vein through the popliteal and proximal calf veins.  Comparison: None  Findings:  Normal compressibility and normal Doppler signal within the common femoral, superficial femoral and popliteal veins, down to the proximal calf veins.  No grayscale filling defects to suggest DVT.  Occlusive thrombus noted within the left calf varicosities.  Complex fluid collection in the popliteal fossa measuring up to 3.5 cm compatible with Baker's cyst.  IMPRESSION: No evidence of left lower extremity deep vein thrombosis.  Superficial thrombophlebitis within the left calf varicosities.  Left popliteal fossa Baker's cyst.   Original Report Authenticated By: Charlett Nose, M.D.    Impression: #1. Metachronous primary bilateral breast  cancers ER positive and PR negative.  Metastatic to bone and right chest wall.  Overall stable disease since most recent progression in January of this year.  No gross evidence for disease on bone scan and PET scan done in May or current MRI scan. Mildly elevated but stable CA 27-29 tumor marker. Plan:  I will continue the tamoxifen and Affinitor.   #2. Chronic gastritis  Recent reevaluation detailed above.   #3. History of extensive superficial thrombophlebitis  She remains on full dose Coumadin anticoagulation  INR on August 19,  2.1  #4. Multifactorial pain from cancer and orthopedic problems.   #5. Status post right rotator cuff repair       CC:.    Levert Feinstein, MD 8/28/20146:58 PM

## 2013-01-13 ENCOUNTER — Ambulatory Visit: Payer: Medicare PPO | Admitting: Internal Medicine

## 2013-01-29 ENCOUNTER — Other Ambulatory Visit: Payer: Self-pay | Admitting: *Deleted

## 2013-01-29 DIAGNOSIS — C50919 Malignant neoplasm of unspecified site of unspecified female breast: Secondary | ICD-10-CM

## 2013-01-29 MED ORDER — HYDROCODONE-ACETAMINOPHEN 10-325 MG PO TABS: 1.0000 | ORAL_TABLET | ORAL | Status: DC | PRN

## 2013-02-02 ENCOUNTER — Ambulatory Visit: Payer: Medicare PPO | Admitting: Internal Medicine

## 2013-02-05 ENCOUNTER — Telehealth: Payer: Self-pay | Admitting: *Deleted

## 2013-02-05 NOTE — Telephone Encounter (Signed)
THE NOTE FROM [facility] WAS GIVEN TO DR.GRANFORTUNA'S NURSE, AMY HORTON,RN.

## 2013-02-09 ENCOUNTER — Ambulatory Visit (INDEPENDENT_AMBULATORY_CARE_PROVIDER_SITE_OTHER): Payer: Medicare PPO | Admitting: Internal Medicine

## 2013-02-09 ENCOUNTER — Encounter: Payer: Self-pay | Admitting: Internal Medicine

## 2013-02-09 VITALS — BP 108/62 | HR 68 | Temp 97.6°F | Ht 68.0 in | Wt 150.4 lb

## 2013-02-09 DIAGNOSIS — K5900 Constipation, unspecified: Secondary | ICD-10-CM

## 2013-02-09 DIAGNOSIS — K219 Gastro-esophageal reflux disease without esophagitis: Secondary | ICD-10-CM

## 2013-02-09 NOTE — Patient Instructions (Signed)
Stop Protonix; Begin Dexilant 60 mg daily - samples provided  Begin Linzess 145 - one capsule daily  Call and let me know how you are doing in 2 weeks.  Office in 6 weeks

## 2013-02-09 NOTE — Progress Notes (Signed)
Primary Care Physician:  [name], MD Primary Gastroenterologist:  Dr. Jena Gauss  Pre-Procedure History & Physical: HPI:  [name] is a 67 y.o. female here for ALT of GERD and constipation. History of metastatic breast cancer. Recent EGD with removal of a duodenal adenoma. Esophagus was dilated. A having a little dysphagia now know but overall improved. Protonix since not controlling reflux as good as it did previously. Could not afford Dexilant. Constipated having about one bowel movement weekly.Nauseeated whend she takes her oral chemotherapeutic agent daily. Zofran helps. No melena or rectal bleeding. 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 Apparently, recent PET scan failed to demonstrate metastatic disease per her report.  Past Medical History  Diagnosis Date  . Cancer     RBreast, Lower back , Under l Arm  . Hypertension   . GERD (gastroesophageal reflux disease)   . Breast cancer metastasized to bone 04/30/2011  . Radiation 01/15/2012    Lumbosacral spine 3500 cGy 14  fx  . Swollen R ankle 08/10/2012  . Fracture of ankle, medial malleolus, right, closed 08/10/2012  . Tubular adenoma 2011  . Hiatal hernia 2011  . Schatzki's ring     Past Surgical History  Procedure Laterality Date  . Gallbladder surgery  1991  . Mastectomy  06/28/10    bilateral- Dr. Elisabeth Pigeon, [place]  . Inguinal hernia repair  90's    right  . Parathyroidectomy  90's  . Carpal tunnel release  90's  . Oophorectomy  90's    right  . Lymph node dissection    . Breast surgery    . Cholecystectomy    . Colonoscopy  01/08/10    Dr. Jena Gauss- anal tag,hemorrhoid o/w normal rectum, pancolonic diverticula, diminutive polyp in the base of the cecum= tubular adenoma on bx. poor prep  . Esophagogastroduodenoscopy  01/08/10    Dr. Jena Gauss- normal esophagus, small hiatal hernia, antum and body erosions, pedunculated polyp between D1 and D2, duodenal diverticulum, lymphangiectasia, second portion of the  duodenum.stomach bx= inflammation, duodenum bx= adenoma  . Rotator cuff repair  08/27/11    Dr. Chaney Malling- [facility]- [facility]  . Esophagogastroduodenoscopy (egd) with propofol N/A 11/19/2012    RMR: non-critical Schatzki's ring s/p dilation with 54 F, multiple gastric polyps, duodenal polyp s/p piecemeal snare polypectomy, gastric polypectomy, path benign  . Maloney dilation N/A 11/19/2012    Procedure: Elease Hashimoto DILATION;  Surgeon: Corbin Ade, MD;  Location: [facility];  Service: Endoscopy;  Laterality: N/A;  #54  . Polypectomy N/A 11/19/2012    Procedure: POLYPECTOMY;  Surgeon: Corbin Ade, MD;  Location: [facility];  Service: Endoscopy;  Laterality: N/A;  duodenal  . Esophageal biopsy N/A 11/19/2012    Procedure: BIOPSY;  Surgeon: Corbin Ade, MD;  Location: [facility];  Service: Endoscopy;  Laterality: N/A;  esophageal    Prior to Admission medications   Medication Sig Start Date End Date Taking? Authorizing Provider  cholecalciferol (VITAMIN D) 1000 UNITS tablet Take 2,000 Units by mouth daily.   Yes Historical Provider, MD  diphenoxylate-atropine (LOMOTIL) 2.5-0.025 MG per tablet Take 2 tablets by mouth as needed for diarrhea or loose stools.  01/23/12  Yes Historical Provider, MD  everolimus (AFINITOR) 7.5 MG tablet Take 1 tablet (7.5 mg total) by mouth daily. Script faxed to [facility] at [phone]. 12/08/12  Yes Levert Feinstein, MD  Homeopathic Products (ARNICA) GEL Apply topically every morning. Feet, legs to the knees   Yes Historical Provider, MD  lisinopril (PRINIVIL,ZESTRIL) 20  MG tablet Take 20 mg by mouth daily.  10/08/10  Yes Historical Provider, MD  mometasone (ELOCON) 0.1 % lotion Apply topically daily as needed. Use as directed 06/29/12  Yes Levert Feinstein, MD  ondansetron (ZOFRAN-ODT) 8 MG disintegrating tablet Take 8 mg by mouth every 8 (eight) hours as needed for nausea.   Yes Historical Provider, MD  pantoprazole (PROTONIX) 40 MG tablet Take 40 mg by mouth 2 (two) times  daily. For indigestion 05/04/12  Yes Levert Feinstein, MD  tamoxifen (NOLVADEX) 20 MG tablet Take 1 tablet (20 mg total) by mouth daily. Written Script given to pt 05/22/12 05/22/12  Yes Levert Feinstein, MD  warfarin (COUMADIN) 4 MG tablet Take 2-4 mg by mouth daily. 2mg  on Mon.,Wed.,Fri., and 4mg  all other days 08/16/10  Yes Historical Provider, MD  HYDROcodone-acetaminophen (NORCO) 10-325 MG per tablet Take 1-2 tablets by mouth every 4 (four) hours as needed for pain. 01/29/13   Levert Feinstein, MD    Allergies as of 02/09/2013 - Review Complete 02/09/2013  Allergen Reaction Noted  . Aspirin  11/13/2012  . Percodan [oxycodone-aspirin] Nausea Only   . Codeine Nausea Only     Family History  Problem Relation Age of Onset  . Cancer Mother     Colon  . Cancer Father     Lung, Mouth,leg    History   Social History  . Marital Status: Widowed    Spouse Name: N/A    Number of Children: N/A  . Years of Education: N/A   Occupational History  . Not on file.   Social History Main Topics  . Smoking status: Former Smoker    Quit date: 11/22/1982  . Smokeless tobacco: Not on file  . Alcohol Use: No  . Drug Use: Not on file  . Sexual Activity: Not on file   Other Topics Concern  . Not on file   Social History Narrative  . No narrative on file    Review of Systems: See HPI, otherwise negative ROS  Physical Exam: BP 108/62  Pulse 68  Temp(Src) 97.6 F (36.4 C) (Oral)  Ht 5\' 8"  (1.727 m)  Wt 150 lb 6.4 oz (68.221 kg)  BMI 22.87 kg/m2 General:   Alert,  Well-developed, well-nourished, pleasant and cooperative in NAD Skin:  Intact without significant lesions or rashes. Eyes:  Sclera clear, no icterus.   Conjunctiva pink. Ears:  Normal auditory acuity. Nose:  No deformity, discharge,  or lesions. Mouth:  No deformity or lesions. Neck:  Supple; no masses or thyromegaly. No significant cervical adenopathy. Lungs:  Clear throughout to auscultation.   No wheezes,  crackles, or rhonchi. No acute distress. Heart:  Regular rate and rhythm; no murmurs, clicks, rubs,  or gallops. Abdomen: Nondistended positive bowel sounds soft and nontender without appreciable mass or organomegaly.  Pulses:  Normal pulses noted. Extremities:  Without clubbing or edema.  Impression:  Pleasant 67 year old lady with metastatic breast cancer, history of GERD dysphagia - improved with  esophageal dilation. Chronic constipation these days. May be contributing to her overall feeling somewhat poorly.  Protonix may not be working as well..   Recommendations:  Stop Protonix; begin Dexilant 60 mg orally daily  (given her a months worth of free samples; we tried this previously but she couldn't afford it and did not take it)  Also begin Linzess 145- one capsule daily.  Samples provided.  I've asked patient to call me in 2 weeks  -  we'll see how things  are going. Office visit planned in 6 weeks.

## 2013-02-24 ENCOUNTER — Telehealth: Payer: Self-pay

## 2013-02-24 NOTE — Telephone Encounter (Signed)
Pt called with a progress report. She stated she quit taking the linzess because she was having very bad diarrhea when taking it, 3-4 x a day. It was so bad that it was running down her legs. She said she is feeling better and eating better now. Pt has a follow up appt on 03/23/13 with RMR.

## 2013-02-24 NOTE — Telephone Encounter (Signed)
Noted; so it totally reversed constipation; Would not do away with it completely; might try ever other day or twice weekly dosing to keep things moving

## 2013-02-25 NOTE — Telephone Encounter (Signed)
Tried to call with no answer  

## 2013-03-02 ENCOUNTER — Telehealth: Payer: Self-pay | Admitting: Oncology

## 2013-03-02 ENCOUNTER — Encounter: Payer: Self-pay | Admitting: Oncology

## 2013-03-02 ENCOUNTER — Other Ambulatory Visit (HOSPITAL_BASED_OUTPATIENT_CLINIC_OR_DEPARTMENT_OTHER): Payer: Medicare PPO | Admitting: Lab

## 2013-03-02 ENCOUNTER — Ambulatory Visit (HOSPITAL_BASED_OUTPATIENT_CLINIC_OR_DEPARTMENT_OTHER): Payer: Medicare PPO | Admitting: Oncology

## 2013-03-02 VITALS — BP 150/76 | HR 71 | Temp 99.1°F | Resp 18 | Ht 68.0 in | Wt 148.9 lb

## 2013-03-02 DIAGNOSIS — R630 Anorexia: Secondary | ICD-10-CM

## 2013-03-02 DIAGNOSIS — C50919 Malignant neoplasm of unspecified site of unspecified female breast: Secondary | ICD-10-CM

## 2013-03-02 DIAGNOSIS — R197 Diarrhea, unspecified: Secondary | ICD-10-CM

## 2013-03-02 DIAGNOSIS — Z8672 Personal history of thrombophlebitis: Secondary | ICD-10-CM

## 2013-03-02 DIAGNOSIS — C7951 Secondary malignant neoplasm of bone: Secondary | ICD-10-CM

## 2013-03-02 DIAGNOSIS — R634 Abnormal weight loss: Secondary | ICD-10-CM

## 2013-03-02 DIAGNOSIS — C44599 Other specified malignant neoplasm of skin of other part of trunk: Secondary | ICD-10-CM

## 2013-03-02 DIAGNOSIS — T50904A Poisoning by unspecified drugs, medicaments and biological substances, undetermined, initial encounter: Secondary | ICD-10-CM

## 2013-03-02 HISTORY — DX: Adverse effect of unspecified drugs, medicaments and biological substances, initial encounter: R63.4

## 2013-03-02 HISTORY — DX: Anorexia: R63.0

## 2013-03-02 LAB — COMPREHENSIVE METABOLIC PANEL (CC13)
ALT: 12 U/L (ref 0–55)
Alkaline Phosphatase: 68 U/L (ref 40–150)
Creatinine: 0.6 mg/dL (ref 0.6–1.1)
Sodium: 145 mEq/L (ref 136–145)
Total Bilirubin: 0.29 mg/dL (ref 0.20–1.20)
Total Protein: 6.1 g/dL — ABNORMAL LOW (ref 6.4–8.3)

## 2013-03-02 LAB — CBC WITH DIFFERENTIAL/PLATELET
BASO%: 1.3 % (ref 0.0–2.0)
EOS%: 2 % (ref 0.0–7.0)
LYMPH%: 11.1 % — ABNORMAL LOW (ref 14.0–49.7)
MCH: 24.4 pg — ABNORMAL LOW (ref 25.1–34.0)
MCHC: 32.5 g/dL (ref 31.5–36.0)
MCV: 75.1 fL — ABNORMAL LOW (ref 79.5–101.0)
MONO%: 14.2 % — ABNORMAL HIGH (ref 0.0–14.0)
Platelets: 183 10*3/uL (ref 145–400)
RBC: 4.54 10*6/uL (ref 3.70–5.45)
WBC: 2.3 10*3/uL — ABNORMAL LOW (ref 3.9–10.3)

## 2013-03-02 LAB — PROTIME-INR: INR: 2 (ref 2.00–3.50)

## 2013-03-02 LAB — LACTATE DEHYDROGENASE (CC13): LDH: 256 U/L — ABNORMAL HIGH (ref 125–245)

## 2013-03-02 MED ORDER — HYDROCODONE-ACETAMINOPHEN 10-325 MG PO TABS: 1.0000 | ORAL_TABLET | ORAL | Status: DC | PRN

## 2013-03-02 MED ORDER — MEGESTROL ACETATE 40 MG PO TABS: 80.0000 mg | ORAL_TABLET | Freq: Two times a day (BID) | ORAL | Status: DC

## 2013-03-02 NOTE — Progress Notes (Signed)
Hematology and Oncology Follow Up Visit  [name] [id number] [birth year] 67 y.o. 03/02/2013 7:23 PM   Principle Diagnosis: Encounter Diagnoses  Name Primary?  . Breast cancer metastasized to bone, unspecified laterality Yes  . Diarrhea   . Personal history of thrombophlebitis   . Anorexia   . Weight loss due to medication      Interim History:   Followup visit for this 67 year old woman with metachronous primary bilateral breast cancers both ER positive and ER negative metastatic to bone (initial primary left breast ER positive) and chest wall(right breast cancer). Most recent salvage regimen is a combination of tamoxifen and Affinitor started in February 2014. Although there has been stabilization of her disease reflected by most recent MRI scans of her spine done on 12/15/2012 and plateau of her CA 27-29 tumor marker in the 40-49 unit range, clinically she is doing poorly with ongoing gastrointestinal problems causing anorexia and poor nutrition and progressive weight loss. She has lost 25 pounds since January. She had a GI reevaluation with upper endoscopy and a July 24. A Schatzki ring was dilated. A duodenal and a gastric polyp were removed. No other major findings. At time of most recent followup visit with Dr.Rourk on October 14, Protonix was changed to Dexilant and then she was put on a trial of Linzess which caused severe diarrhea. She stopped the drug after 5 days. She is having atypical chest pain bilaterally. She is having proximal thigh pain and distal paresthesias. She is having intermittent nausea. Insomnia. In short, she is miserable.  Medications: reviewed  Allergies:  Allergies  Allergen Reactions  . Aspirin   . Percodan [Oxycodone-Aspirin] Nausea Only  . Codeine Nausea Only    Review of Systems: Hematology:  No swollen glands ENT ROS: No sore throat Breast ROS: Respiratory ROS: No cough or dyspnea Cardiovascular ROS:   No ischemic type chest pain or  palpitations Gastrointestinal ROS:   Persistent epigastric pain Genito-Urinary ROS: Intermittent urinary incontinence Musculoskeletal ROS: Proximal thigh pain. Intermittent back pain. Neurological ROS: No headache or change in vision. No focal weakness. Positive distal paresthesias Dermatological ROS: She noticed a area of thrombosed superficial vein on her left calf Remaining ROS negative  Physical Exam: Blood pressure 150/76, pulse 71, temperature 99.1 F (37.3 C), temperature source Oral, resp. rate 18, height 5\' 8"  (1.727 m), weight 148 lb 14.4 oz (67.541 kg). Wt Readings from Last 3 Encounters:  03/02/13 148 lb 14.4 oz (67.541 kg)  02/09/13 150 lb 6.4 oz (68.221 kg)  12/22/12 154 lb 1.6 oz (69.899 kg)     General appearance: She appears chronically ill and weight loss is obvious HENNT: Pharynx no erythema, exudate, mass, or ulcer. No thyromegaly or thyroid nodules Lymph nodes: No cervical, supraclavicular, or axillary lymphadenopathy Breasts: Bilateral mastectomies. No chest wall lesions. Lungs: Clear to auscultation, resonant to percussion throughout Heart: Regular rhythm, no murmur, no gallop, no rub, no click, no edema Abdomen: Soft, nontender, normal bowel sounds, no mass, no organomegaly Extremities: No edema, no calf tenderness Musculoskeletal: no joint deformities GU:  Vascular: Carotid pulses 2+, no bruits,  Neurologic: Alert, oriented, PERRLA, , cranial nerves grossly normal, motor strength 5 over 5, reflexes 1+ symmetric, upper body coordination normal, gait normal, Skin: No rash or ecchymosis; small 0.5 cm area of thrombosed superficial vein left calf. Varicose veins noted.  Lab Results: CBC W/Diff    Component Value Date/Time   WBC 2.3* 03/02/2013 0942   WBC 8.8 06/27/2010 1558   RBC 4.54  03/02/2013 0942   RBC 4.52 06/27/2010 1558   HGB 11.1* 03/02/2013 0942   HGB 13.6 06/27/2010 1558   HCT 34.1* 03/02/2013 0942   HCT 40.6 06/27/2010 1558   PLT 183 03/02/2013 0942    PLT 272 06/27/2010 1558   MCV 75.1* 03/02/2013 0942   MCV 89.8 06/27/2010 1558   MCH 24.4* 03/02/2013 0942   MCH 30.1 06/27/2010 1558   MCHC 32.5 03/02/2013 0942   MCHC 33.5 06/27/2010 1558   RDW 16.0* 03/02/2013 0942   RDW 13.0 06/27/2010 1558   LYMPHSABS 0.3* 03/02/2013 0942   LYMPHSABS 1.0 06/27/2010 1558   MONOABS 0.3 03/02/2013 0942   MONOABS 1.0 06/27/2010 1558   EOSABS 0.0 03/02/2013 0942   EOSABS 0.2 06/27/2010 1558   BASOSABS 0.0 03/02/2013 0942   BASOSABS 0.0 06/27/2010 1558     Chemistry      Component Value Date/Time   NA 145 03/02/2013 0942   NA 141 09/24/2011 1420   K 3.6 03/02/2013 0942   K 4.0 09/24/2011 1420   CL 110* 09/02/2012 0814   CL 104 09/24/2011 1420   CO2 27 03/02/2013 0942   CO2 26 09/24/2011 1420   BUN 13.1 03/02/2013 0942   BUN 17 09/24/2011 1420   CREATININE 0.6 03/02/2013 0942   CREATININE 0.58 09/24/2011 1420      Component Value Date/Time   CALCIUM 8.6 03/02/2013 0942   CALCIUM 9.4 09/24/2011 1420   ALKPHOS 68 03/02/2013 0942   ALKPHOS 78 09/24/2011 1420   AST 23 03/02/2013 0942   AST 18 09/24/2011 1420   ALT 12 03/02/2013 0942   ALT 16 09/24/2011 1420   BILITOT 0.29 03/02/2013 0942   BILITOT 0.4 09/24/2011 1420       Impression:  #1. Complex ER-positive and ER negative metachronous primary bilateral breast cancers. She has been through multiple lines of therapy with initial diagnosis right breast in December 2006 7 node positive, ER/PR positive, treated with lumpectomy, radiation, and chemotherapy with sequential Adriamycin Cytoxan and Taxotere.. Progression in bone while on tamoxifen September 2011. Initial complete response to monthly Faslodex injections.She developed a malignant left axillary lymph node with no clear primary in the left breast November 2011. This tumor was ER negative. HER-2 negative. While under evaluation for this lesion she developed a new lesion in the right breast which was ER positive. She underwent bilateral mastectomies 06/28/2010. She was  continued on hormonal therapy with monthly Faslodex injections in view of the ER positive component of her tumor with a complete response to this hormone in bone. She underwent radiation to the left chest and axilla subsequent to the mastectomies. 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 She was treated with electron beam radiation.. 3000 cGy in 15 fractions through December 2012. Further progression in bone in August 2013. Palliative radiation to lumbosacral spine September through October 2013. Brief trial of Xeloda November 2013 through January 2014. Change to tamoxifen plus Affinitor February 2014. Now with declining overall status with a 25 pound weight loss and multi-system complaints.  She is very discouraged and really is considering stopping all treatment. I do feel that we need to stop the Affinitor. She did agree to a trial of Megace hormonal therapy I am hoping that we will see both antitumor activity and appetite stimulation from this progesterone drug. I will start at 65  mg twice daily.  #2. Extensive superficial phlebitis In view of her metastatic cancer I elected to put her on full dose Coumadin anticoagulation. INR today is 2.04 mg daily, except 2 mg on Monday Wednesdays and Fridays.  #3. Chronic, persistent, GI complaints At this point I am inclined to believe that these are a paraneoplastic manifestation of her breast cancer.    CC: Patient Care Team: [name] as PCP - General Billie Lade, MD (Radiation Oncology) Corbin Ade, MD (Gastroenterology) Levert Feinstein, MD (Hematology and Oncology)   Levert Feinstein, MD 11/4/20147:23 PM

## 2013-03-02 NOTE — Telephone Encounter (Signed)
gv and printed appt sched for pt for Jan 2015

## 2013-03-03 ENCOUNTER — Encounter: Payer: Self-pay | Admitting: Oncology

## 2013-03-03 ENCOUNTER — Telehealth: Payer: Self-pay | Admitting: *Deleted

## 2013-03-03 NOTE — Progress Notes (Signed)
[place], [phone], for megestrol acetate 40mg  pa; should receive response within 24-72 hours.

## 2013-03-03 NOTE — Telephone Encounter (Signed)
Patient calls asking about her megace.  We just received the request for prior authorization.  Spoke with Karel Jarvis in managed care.  It usually takes 24 to 72 hrs.  Let Cordelia Pen know what to expect in the reference of time.  Pt. States pharmacy has drug ready - just waiting on approval..  She appreciated my help with this.

## 2013-03-04 ENCOUNTER — Other Ambulatory Visit: Payer: Self-pay

## 2013-03-04 NOTE — Telephone Encounter (Signed)
Tried to call pt- NA 

## 2013-03-15 ENCOUNTER — Encounter: Payer: Self-pay | Admitting: Oncology

## 2013-03-15 NOTE — Progress Notes (Signed)
Pt is approved thru [employer] for Afinitor from 02/13/13 to 02/12/14 or when benefit cap has been met.  The amount of the grant is $7500.

## 2013-03-19 ENCOUNTER — Other Ambulatory Visit: Payer: Self-pay | Admitting: *Deleted

## 2013-03-19 ENCOUNTER — Ambulatory Visit: Payer: Medicare PPO | Admitting: Internal Medicine

## 2013-03-19 MED ORDER — ONDANSETRON 8 MG PO TBDP: 8.0000 mg | ORAL_TABLET | Freq: Three times a day (TID) | ORAL | Status: DC | PRN

## 2013-03-23 ENCOUNTER — Ambulatory Visit (INDEPENDENT_AMBULATORY_CARE_PROVIDER_SITE_OTHER): Payer: Medicare PPO | Admitting: Internal Medicine

## 2013-03-23 ENCOUNTER — Telehealth: Payer: Self-pay

## 2013-03-23 ENCOUNTER — Encounter: Payer: Self-pay | Admitting: Internal Medicine

## 2013-03-23 VITALS — BP 111/62 | HR 71 | Temp 98.1°F | Ht 68.0 in | Wt 151.2 lb

## 2013-03-23 DIAGNOSIS — K219 Gastro-esophageal reflux disease without esophagitis: Secondary | ICD-10-CM

## 2013-03-23 DIAGNOSIS — K5900 Constipation, unspecified: Secondary | ICD-10-CM

## 2013-03-23 NOTE — Patient Instructions (Signed)
Resume Dexilant 60 mg daily as samples provided  We'll also call in a prescription to [facility]  Try Benadryl 25 mg tablet bedtime to facilitate sleep  Office visit here in 6 weeks

## 2013-03-23 NOTE — Progress Notes (Signed)
Primary Care Physician:  [name], MD Primary Gastroenterologist:  Dr. Jena Gauss  Pre-Procedure History & Physical: HPI:  [name] is a 67 y.o. female here for followup of GERD/constipation this setting metastatic breast cancer. Previously constipated tried Linzess - explosive diarrhea even after one capsule. This medication was stopped. Her chemotherapy has been stopped by Dr. Cyndie Chime. She is now having one bowel movement daily and states constipation is much improved  - also now on Megace. Weight gain of 1 pound since she was last seen here. Ran out of Dexilant-protonix not nearly as good. No dysphagia..  Past Medical History  Diagnosis Date  . Cancer     RBreast, Lower back , Under l Arm  . Hypertension   . GERD (gastroesophageal reflux disease)   . Breast cancer metastasized to bone 04/30/2011  . Radiation 01/15/2012    Lumbosacral spine 3500 cGy 14  fx  . Swollen R ankle 08/10/2012  . Fracture of ankle, medial malleolus, right, closed 08/10/2012  . Tubular adenoma 2011  . Hiatal hernia 2011  . Schatzki's ring   . Anorexia 03/02/2013  . Weight loss due to medication 03/02/2013    Past Surgical History  Procedure Laterality Date  . Gallbladder surgery  1991  . Mastectomy  06/28/10    bilateral- Dr. Elisabeth Pigeon, [place]  . Inguinal hernia repair  90's    right  . Parathyroidectomy  90's  . Carpal tunnel release  90's  . Oophorectomy  90's    right  . Lymph node dissection    . Breast surgery    . Cholecystectomy    . Colonoscopy  01/08/10    Dr. Jena Gauss- anal tag,hemorrhoid o/w normal rectum, pancolonic diverticula, diminutive polyp in the base of the cecum= tubular adenoma on bx. poor prep  . Esophagogastroduodenoscopy  01/08/10    Dr. Jena Gauss- normal esophagus, small hiatal hernia, antum and body erosions, pedunculated polyp between D1 and D2, duodenal diverticulum, lymphangiectasia, second portion of the duodenum.stomach bx= inflammation, duodenum bx= adenoma  .  Rotator cuff repair  08/27/11    Dr. Chaney Malling- [facility]- [facility]  . Esophagogastroduodenoscopy (egd) with propofol N/A 11/19/2012    RMR: non-critical Schatzki's ring s/p dilation with 54 F, multiple gastric polyps, duodenal polyp s/p piecemeal snare polypectomy, gastric polypectomy, path benign  . Maloney dilation N/A 11/19/2012    Procedure: Elease Hashimoto DILATION;  Surgeon: Corbin Ade, MD;  Location: [facility];  Service: Endoscopy;  Laterality: N/A;  #54  . Polypectomy N/A 11/19/2012    Procedure: POLYPECTOMY;  Surgeon: Corbin Ade, MD;  Location: [facility];  Service: Endoscopy;  Laterality: N/A;  duodenal  . Esophageal biopsy N/A 11/19/2012    Procedure: BIOPSY;  Surgeon: Corbin Ade, MD;  Location: [facility];  Service: Endoscopy;  Laterality: N/A;  esophageal    Prior to Admission medications   Medication Sig Start Date End Date Taking? Authorizing Provider  cholecalciferol (VITAMIN D) 1000 UNITS tablet Take 2,000 Units by mouth daily.   Yes Historical Provider, MD  dexlansoprazole (DEXILANT) 60 MG capsule Take 60 mg by mouth daily.   Yes Historical Provider, MD  diphenoxylate-atropine (LOMOTIL) 2.5-0.025 MG per tablet Take 2 tablets by mouth as needed for diarrhea or loose stools.  01/23/12  Yes Historical Provider, MD  Homeopathic Products (ARNICA) GEL Apply topically every morning. Feet, legs to the knees   Yes Historical Provider, MD  HYDROcodone-acetaminophen (NORCO) 10-325 MG per tablet Take 1-2 tablets by mouth every 4 (four) hours as needed.  03/02/13  Yes Levert Feinstein, MD  lisinopril (PRINIVIL,ZESTRIL) 20 MG tablet Take 20 mg by mouth daily.  10/08/10  Yes Historical Provider, MD  megestrol (MEGACE) 40 MG tablet Take 2 tablets (80 mg total) by mouth 2 (two) times daily. 03/02/13  Yes Levert Feinstein, MD  mometasone (ELOCON) 0.1 % lotion Apply topically daily as needed. Use as directed 06/29/12  Yes Levert Feinstein, MD  warfarin (COUMADIN) 4 MG tablet Take 2-4 mg by mouth daily. 2mg  on  Mon.,Wed.,Fri., and 4mg  all other days 08/16/10  Yes Historical Provider, MD  everolimus (AFINITOR) 7.5 MG tablet Take 1 tablet (7.5 mg total) by mouth daily. Script faxed to [facility] at [phone]. 12/08/12   Levert Feinstein, MD  ondansetron (ZOFRAN-ODT) 8 MG disintegrating tablet Take 1 tablet (8 mg total) by mouth every 8 (eight) hours as needed for nausea. 03/19/13   Levert Feinstein, MD  tamoxifen (NOLVADEX) 20 MG tablet Take 1 tablet (20 mg total) by mouth daily. Written Script given to pt 05/22/12 05/22/12   Levert Feinstein, MD    Allergies as of 03/23/2013 - Review Complete 03/23/2013  Allergen Reaction Noted  . Aspirin  11/13/2012  . Percodan [oxycodone-aspirin] Nausea Only   . Codeine Nausea Only     Family History  Problem Relation Age of Onset  . Cancer Mother     Colon  . Cancer Father     Lung, Mouth,leg    History   Social History  . Marital Status: Widowed    Spouse Name: N/A    Number of Children: N/A  . Years of Education: N/A   Occupational History  . Not on file.   Social History Main Topics  . Smoking status: Former Smoker    Quit date: 11/22/1982  . Smokeless tobacco: Not on file  . Alcohol Use: No  . Drug Use: Not on file  . Sexual Activity: Not on file   Other Topics Concern  . Not on file   Social History Narrative  . No narrative on file    Review of Systems: See HPI, otherwise negative ROS  Physical Exam: BP 111/62  Pulse 71  Temp(Src) 98.1 F (36.7 C) (Oral)  Ht 5\' 8"  (1.727 m)  Wt 151 lb 3.2 oz (68.584 kg)  BMI 23.00 kg/m2 General:   Alert, , pleasant and cooperative in NAD Skin:  Intact without significant lesions or rashes. Eyes:  Sclera clear, no icterus.   Conjunctiva pink. Ears:  Normal auditory acuity. Nose:  No deformity, discharge,  or lesions. Mouth:  No deformity or lesions. Neck:  Supple; no masses or thyromegaly. No significant cervical adenopathy. Lungs:  Clear throughout to auscultation.    No wheezes, crackles, or rhonchi. No acute distress. Heart:  Regular rate and rhythm; no murmurs, clicks, rubs,  or gallops. Abdomen: Non-distended, normal bowel sounds.  Soft and nontender without appreciable mass or hepatosplenomegaly.   Impression:    Pleasant 67 year old female with metastatic breast cancer, GERD and intermittent constipation. Fortunately, constipation has improved significantly recently to the point where no specific therapy needs to be employed currently. GERD poorly controlled on protonix.; she did much better on Dexilant. Complains of insomnia. Doesn't want take a prescription sleep aid.  Recommendations:   Resume Dexilant 60 mg daily. Gave her large supply of samples. Try Benadryl 25 mg at bedtime to facilitate sleep.  Office visit here in 6 weeks

## 2013-03-23 NOTE — Telephone Encounter (Signed)
Per Dr. Jena Gauss, Dexilant 60 mg #30 one qd called to [facility].

## 2013-04-14 ENCOUNTER — Other Ambulatory Visit: Payer: Self-pay | Admitting: *Deleted

## 2013-04-14 DIAGNOSIS — C50919 Malignant neoplasm of unspecified site of unspecified female breast: Secondary | ICD-10-CM

## 2013-04-14 MED ORDER — HYDROCODONE-ACETAMINOPHEN 10-325 MG PO TABS: 1.0000 | ORAL_TABLET | ORAL | Status: DC | PRN

## 2013-04-14 NOTE — Telephone Encounter (Signed)
Patient called and requested that we mail her script to her for refill on her hydrocodone.

## 2013-04-15 ENCOUNTER — Telehealth: Payer: Self-pay | Admitting: *Deleted

## 2013-04-15 NOTE — Telephone Encounter (Signed)
Left patient message that we were putting her script in the mail this am.  Hopefully will reach her by Saturday.04/17/13.

## 2013-05-04 ENCOUNTER — Telehealth: Payer: Self-pay | Admitting: Oncology

## 2013-05-04 ENCOUNTER — Other Ambulatory Visit (HOSPITAL_BASED_OUTPATIENT_CLINIC_OR_DEPARTMENT_OTHER): Payer: Medicare PPO

## 2013-05-04 ENCOUNTER — Ambulatory Visit (HOSPITAL_BASED_OUTPATIENT_CLINIC_OR_DEPARTMENT_OTHER): Payer: Medicare PPO | Admitting: Oncology

## 2013-05-04 VITALS — BP 144/72 | HR 73 | Temp 98.1°F | Resp 18 | Ht 68.0 in | Wt 152.9 lb

## 2013-05-04 DIAGNOSIS — T50905A Adverse effect of unspecified drugs, medicaments and biological substances, initial encounter: Secondary | ICD-10-CM

## 2013-05-04 DIAGNOSIS — R634 Abnormal weight loss: Secondary | ICD-10-CM

## 2013-05-04 DIAGNOSIS — C7951 Secondary malignant neoplasm of bone: Secondary | ICD-10-CM

## 2013-05-04 DIAGNOSIS — G8929 Other chronic pain: Secondary | ICD-10-CM

## 2013-05-04 DIAGNOSIS — C50919 Malignant neoplasm of unspecified site of unspecified female breast: Secondary | ICD-10-CM

## 2013-05-04 DIAGNOSIS — C7952 Secondary malignant neoplasm of bone marrow: Secondary | ICD-10-CM

## 2013-05-04 DIAGNOSIS — K319 Disease of stomach and duodenum, unspecified: Secondary | ICD-10-CM

## 2013-05-04 DIAGNOSIS — R630 Anorexia: Secondary | ICD-10-CM

## 2013-05-04 DIAGNOSIS — Z8672 Personal history of thrombophlebitis: Secondary | ICD-10-CM

## 2013-05-04 DIAGNOSIS — M79609 Pain in unspecified limb: Secondary | ICD-10-CM

## 2013-05-04 LAB — CBC WITH DIFFERENTIAL/PLATELET
BASO%: 0.8 % (ref 0.0–2.0)
Basophils Absolute: 0 10*3/uL (ref 0.0–0.1)
EOS ABS: 0.1 10*3/uL (ref 0.0–0.5)
EOS%: 1.5 % (ref 0.0–7.0)
HEMATOCRIT: 38.9 % (ref 34.8–46.6)
HGB: 13.2 g/dL (ref 11.6–15.9)
LYMPH%: 9.7 % — AB (ref 14.0–49.7)
MCH: 28.7 pg (ref 25.1–34.0)
MCHC: 34.1 g/dL (ref 31.5–36.0)
MCV: 84.4 fL (ref 79.5–101.0)
MONO#: 0.7 10*3/uL (ref 0.1–0.9)
MONO%: 11.5 % (ref 0.0–14.0)
NEUT%: 76.5 % (ref 38.4–76.8)
NEUTROS ABS: 4.9 10*3/uL (ref 1.5–6.5)
PLATELETS: 273 10*3/uL (ref 145–400)
RBC: 4.61 10*6/uL (ref 3.70–5.45)
RDW: 22.1 % — ABNORMAL HIGH (ref 11.2–14.5)
WBC: 6.4 10*3/uL (ref 3.9–10.3)
lymph#: 0.6 10*3/uL — ABNORMAL LOW (ref 0.9–3.3)

## 2013-05-04 LAB — COMPREHENSIVE METABOLIC PANEL (CC13)
ALT: 17 U/L (ref 0–55)
AST: 16 U/L (ref 5–34)
Albumin: 3.3 g/dL — ABNORMAL LOW (ref 3.5–5.0)
Alkaline Phosphatase: 90 U/L (ref 40–150)
Anion Gap: 8 mEq/L (ref 3–11)
BILIRUBIN TOTAL: 0.33 mg/dL (ref 0.20–1.20)
BUN: 13.4 mg/dL (ref 7.0–26.0)
CO2: 25 mEq/L (ref 22–29)
CREATININE: 0.7 mg/dL (ref 0.6–1.1)
Calcium: 9.1 mg/dL (ref 8.4–10.4)
Chloride: 109 mEq/L (ref 98–109)
GLUCOSE: 99 mg/dL (ref 70–140)
Potassium: 4.3 mEq/L (ref 3.5–5.1)
Sodium: 142 mEq/L (ref 136–145)
TOTAL PROTEIN: 6.7 g/dL (ref 6.4–8.3)

## 2013-05-04 LAB — PROTIME-INR
INR: 2.3 (ref 2.00–3.50)
PROTIME: 27.6 s — AB (ref 10.6–13.4)

## 2013-05-04 LAB — CANCER ANTIGEN 27.29: CA 27.29: 74 U/mL — AB (ref 0–39)

## 2013-05-04 LAB — LACTATE DEHYDROGENASE (CC13): LDH: 224 U/L (ref 125–245)

## 2013-05-04 MED ORDER — HYDROCODONE-ACETAMINOPHEN 10-325 MG PO TABS: 1.0000 | ORAL_TABLET | ORAL | Status: DC | PRN

## 2013-05-04 MED ORDER — PANTOPRAZOLE SODIUM 40 MG PO TBEC: 40.0000 mg | DELAYED_RELEASE_TABLET | Freq: Every day | ORAL | Status: DC

## 2013-05-04 NOTE — Telephone Encounter (Signed)
appts made per 1/6 POF MRIs scheduled FUP w Dr Sondra Come scheduled at pts request as per 1/6 POF AVS and CAL given and pt advised to bring 01/06 creatinine lab results with her to MRI appt shh

## 2013-05-04 NOTE — Progress Notes (Signed)
Hematology and Oncology Follow Up Visit  [name] [id number] [birth year] 68 y.o. 05/04/2013 8:13 PM   Principle Diagnosis: Encounter Diagnosis  Name Primary?  . 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  Most recent salvage regimen was a combination of tamoxifen and Affinitor started in February 2014. Although there had been initial stabilization of her disease reflected by  MRI scans of her spine done on 12/15/2012 and plateau of her CA 27-29 tumor marker in the 40-49 unit range, clinically she was doing poorly with ongoing gastrointestinal problems causing anorexia and poor nutrition and progressive weight loss. She has lost 25 pounds since January. She had a GI reevaluation with upper endoscopy and a July 24. A Schatzki ring was dilated. A duodenal and a gastric polyp were removed. No other major findings. At time of most recent followup visit with Dr.Rourk on October 14, Protonix was changed to [place] and then she was put on a trial of Linzess which caused severe diarrhea. She stopped the drug after 5 days.  At time of her most recent visit here on November 4, in addition to the above findings, her tumor marker was increasing in was up to 53 units.  I stopped the tamoxifen/Affinitor and put her on a trial of Megace 80 mg twice daily to take advantage of both anti-cancer effects and the appetite stimulating effects of this drug. The  Megace is causing headaches but not hot flashes. Her appetite has improved significantly and she has gained 4 pounds on Megace which she is not happy about. Unfortunately, she is having more low back pain primarily in the sacral area. In reviewing her records, she had  previous radiation to the lumbar sacral spine 3500 cGy in 14 fractions between September 18 and 02/03/2012.  Medications: reviewed  Allergies:  Allergies  Allergen Reactions  . Aspirin   . Percodan [Oxycodone-Aspirin] Nausea Only  . Codeine Nausea Only    Review of Systems: Hematology:  No bleeding or bruising ENT ROS: No sore throat Breast ROS:  Respiratory ROS: No cough or dyspnea Cardiovascular ROS: No ischemic type chest pain or pressure. She has been experiencing a pounding heartbeat for the last few months. Not related to exertion.  Gastrointestinal ROS: See above  Genito-Urinary ROS no urinary tract symptoms Musculoskeletal ROS: see above Neurological ROS: No headache or change in vision Dermatological ROS: No rash Remaining ROS negative.  Physical Exam: Blood pressure 144/72, pulse 73, temperature 98.1 F (36.7 C), temperature source Oral, resp. rate 18, height 5\' 8"  (1.727 m), weight 152 lb 14.4 oz (69.355 kg). Wt Readings from Last 3 Encounters:  05/04/13 152 lb 14.4 oz (69.355 kg)  03/23/13 151 lb 3.2 oz (68.584 kg)  03/02/13 148 lb 14.4 oz (67.541 kg)     General appearance: Thin, chronically ill-appearing Caucasian woman HENNT: Pharynx no erythema, exudate, mass, or ulcer. No thyromegaly or thyroid nodules Lymph nodes: No cervical, supraclavicular, or axillary lymphadenopathy Breasts: Bilateral mastectomies. No chest wall lesions. Lungs: Clear to auscultation, resonant to percussion throughout Heart: Regular rhythm, no murmur, no gallop, no rub, no click, no edema Abdomen: Soft, nontender, normal bowel sounds, no mass, no organomegaly Extremities: No edema, no calf tenderness Musculoskeletal: no joint deformities GU: Vascular: Carotid pulses 2+,  no bruits, Neurologic: Alert, oriented, PERRLA, , cranial nerves grossly normal, motor strength 5 over 5, reflexes 1+ symmetric, upper body coordination normal, gait normal, Skin: No rash or ecchymosis  Lab  Results: CBC W/Diff    Component Value Date/Time   WBC 6.4 05/04/2013 0925   WBC 8.8 06/27/2010 1558   RBC 4.61 05/04/2013 0925   RBC 4.52 06/27/2010 1558   HGB 13.2 05/04/2013 0925   HGB 13.6 06/27/2010 1558   HCT 38.9 05/04/2013 0925   HCT 40.6 06/27/2010 1558   PLT 273 05/04/2013 0925   PLT 272 06/27/2010 1558   MCV 84.4 05/04/2013 0925   MCV 89.8 06/27/2010 1558   MCH 28.7 05/04/2013 0925   MCH 30.1 06/27/2010 1558   MCHC 34.1 05/04/2013 0925   MCHC 33.5 06/27/2010 1558   RDW 22.1* 05/04/2013 0925   RDW 13.0 06/27/2010 1558   LYMPHSABS 0.6* 05/04/2013 0925   LYMPHSABS 1.0 06/27/2010 1558   MONOABS 0.7 05/04/2013 0925   MONOABS 1.0 06/27/2010 1558   EOSABS 0.1 05/04/2013 0925   EOSABS 0.2 06/27/2010 1558   BASOSABS 0.0 05/04/2013 0925   BASOSABS 0.0 06/27/2010 1558     Chemistry      Component Value Date/Time   NA 142 05/04/2013 0926   NA 141 09/24/2011 1420   K 4.3 05/04/2013 0926   K 4.0 09/24/2011 1420   CL 110* 09/02/2012 0814   CL 104 09/24/2011 1420   CO2 25 05/04/2013 0926   CO2 26 09/24/2011 1420   BUN 13.4 05/04/2013 0926   BUN 17 09/24/2011 1420   CREATININE 0.7 05/04/2013 0926   CREATININE 0.58 09/24/2011 1420      Component Value Date/Time   CALCIUM 9.1 05/04/2013 0926   CALCIUM 9.4 09/24/2011 1420   ALKPHOS 90 05/04/2013 0926   ALKPHOS 78 09/24/2011 1420   AST 16 05/04/2013 0926   AST 18 09/24/2011 1420   ALT 17 05/04/2013 0926   ALT 16 09/24/2011 1420   BILITOT 0.33 05/04/2013 0926   BILITOT 0.4 09/24/2011 1420     CA 27-29 tumor marker continues to rise and is 74 units today.    Impression:  #1. Metachronous bilateral breast cancers metastatic to bone and right chest wall. She has now progressed or not tolerated all available hormonal drugs. Tumor marker was not yet back when I saw her today. I would like to get another point on the curve before stopping the Megace and I will repeat a tumor marker again in 6 weeks. However, my guess is that I will have to put her back on a chemotherapy program which is  going to be difficult for her given her distance from our center. She has already progressed on oral chemotherapy with Xeloda in the past. I am going to go ahead and repeat MRIs of her spine and get a followup visit with her radiation oncologist to see if there is any more room for additional palliative radiation.  #2. Chronic GI complaints. She did not tolerate the excellent. She is due to see Dr. Buford Dresser again later this week.  #3. Chronic orthopedic problems with chronic pain of her feet.  #4. Status post right rotator cuff repair  #5. History of extensive superficial phlebitis on chronic low-dose Coumadin anticoagulation.  CC: Patient Care Team: Curlene Labrum as PCP - General Blair Promise, MD (Radiation Oncology) Daneil Dolin, MD (Gastroenterology) Annia Belt, MD (Hematology and Oncology)   Annia Belt, MD 1/6/20158:13 PM

## 2013-05-12 ENCOUNTER — Ambulatory Visit: Payer: Medicare PPO | Admitting: Internal Medicine

## 2013-05-14 ENCOUNTER — Ambulatory Visit (HOSPITAL_COMMUNITY)
Admission: RE | Admit: 2013-05-14 | Discharge: 2013-05-14 | Disposition: A | Discharge status: Home / Self Care | Payer: Medicare PPO | Source: Ambulatory Visit | Attending: Oncology | Admitting: Oncology

## 2013-05-14 ENCOUNTER — Other Ambulatory Visit: Payer: Self-pay | Admitting: Oncology

## 2013-05-14 DIAGNOSIS — C801 Malignant (primary) neoplasm, unspecified: Secondary | ICD-10-CM

## 2013-05-14 DIAGNOSIS — C50919 Malignant neoplasm of unspecified site of unspecified female breast: Secondary | ICD-10-CM

## 2013-05-14 DIAGNOSIS — M5126 Other intervertebral disc displacement, lumbar region: Secondary | ICD-10-CM | POA: Insufficient documentation

## 2013-05-14 DIAGNOSIS — C7951 Secondary malignant neoplasm of bone: Principal | ICD-10-CM

## 2013-05-14 DIAGNOSIS — C7952 Secondary malignant neoplasm of bone marrow: Principal | ICD-10-CM

## 2013-05-14 DIAGNOSIS — M47812 Spondylosis without myelopathy or radiculopathy, cervical region: Secondary | ICD-10-CM | POA: Insufficient documentation

## 2013-05-14 DIAGNOSIS — M5124 Other intervertebral disc displacement, thoracic region: Secondary | ICD-10-CM | POA: Insufficient documentation

## 2013-05-14 MED ORDER — GADOBENATE DIMEGLUMINE 529 MG/ML IV SOLN
14.0000 mL | Freq: Once | INTRAVENOUS | Status: AC | PRN
  Administered 2013-05-14: 14 mL via INTRAVENOUS

## 2013-05-17 NOTE — Progress Notes (Signed)
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   Sites of Visceral and Bony Metastatic Disease: Per MRI from 05/14/13: slightly progressive metastatic disease involving the cervical spine with new small lesions and C3, C4 and C6. Significant progression of metastatic disease involving the thoracic spine. Enlarging lesions noted and T2, T3, T9 and T11. There also multiple new lesions.  Progressive metastatic disease in the lumbar spine mainly involving L1 and L2. The L4 disease appears stable.    Location(s) of Symptomatic Metastases: thoracic/lumbar spine  Past/Anticipated chemotherapy by medical oncology, if any: Per Dr. Beryle Beams , patient "has now progressed or not tolerated all available hormonal drugs."   Pain on a scale of 0-10 is: 8. Takes 2 Norco tablets 5 times a day.    If Spine Met(s), symptoms, if any, include:  Bowel/Bladder retention or incontinence (please describe): none  Numbness or weakness in extremities (please describe): numbness in bil feet, right hand  Current Decadron regimen, if applicable: none  Ambulatory status? Walker? Wheelchair?: ambulatory  SAFETY ISSUES:  Prior radiation? 01/15/2012 - Lumbosacral spine 3500 cGy 14  fx  Pacemaker/ICD? no  Possible current pregnancy? no  Is the patient on methotrexate? no  Current Complaints / other details:  Rating pain at 8/10 in low and mid back.  Currently taking norco 2 tablets 5 times a day.

## 2013-05-18 ENCOUNTER — Telehealth: Payer: Self-pay

## 2013-05-18 NOTE — Telephone Encounter (Signed)
Spoke with patient to confirm she still come in for appointment with Dr.Kinard tomorrow.Arrive at 1:30 for nurse and 2:00 pm for doctor.

## 2013-05-19 ENCOUNTER — Ambulatory Visit
Admission: RE | Admit: 2013-05-19 | Discharge: 2013-05-19 | Disposition: A | Discharge status: Home / Self Care | Payer: Medicare PPO | Source: Ambulatory Visit | Attending: Radiation Oncology | Admitting: Radiation Oncology

## 2013-05-19 ENCOUNTER — Encounter: Payer: Self-pay | Admitting: Radiation Oncology

## 2013-05-19 VITALS — BP 150/74 | HR 68 | Temp 98.6°F | Ht 68.0 in | Wt 159.1 lb

## 2013-05-19 DIAGNOSIS — C50919 Malignant neoplasm of unspecified site of unspecified female breast: Secondary | ICD-10-CM

## 2013-05-19 DIAGNOSIS — C7951 Secondary malignant neoplasm of bone: Principal | ICD-10-CM

## 2013-05-19 NOTE — Progress Notes (Signed)
Radiation Oncology         [phone] ________________________________  Name: [name] MRN: [id number]  Date: 05/19/2013  DOB: [birth year]  Follow-Up Visit Note  CC: Curlene Labrum, MD  Annia Belt, MD  Diagnosis:   Bilateral metachronous breast cancer  Interval Since Last Radiation:  One year and 3 months, the patient most recently completed radiation treatments to the lumbosacral spine. She received her radiation therapy at the [facility] clinic. She completed her 35 gray in 14 fractions.  Narrative:  The patient returns today for further evaluation and consideration for additional radiation therapy. The patient most recently been having problems with upper lumbar back pain as well as some pain in the right pelvis area. 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 Patient did undergo recent imaging documented below which shows progressive disease in the upper lumbar spine. In light of patient's pain and x-ray findings she is now seen in radiation oncology for consideration for additional treatment.                              ALLERGIES:  is allergic to aspirin; percodan; and codeine.  Meds: Current Outpatient Prescriptions  Medication Sig Dispense Refill  . cholecalciferol (VITAMIN D) 1000 UNITS tablet Take 2,000 Units by mouth daily.      . diphenoxylate-atropine (LOMOTIL) 2.5-0.025 MG per tablet Take 2 tablets by mouth as needed for diarrhea or loose stools.       . Homeopathic Products (ARNICA) GEL Apply topically every morning. Feet, legs to the knees      . HYDROcodone-acetaminophen (NORCO) 10-325 MG per tablet Take 1-2 tablets by mouth every 4 (four) hours as needed.  360 tablet  0  . lisinopril (PRINIVIL,ZESTRIL) 20 MG tablet Take 20 mg by mouth daily.       . megestrol (MEGACE) 40 MG tablet Take 2 tablets (80 mg total) by mouth 2 (two) times daily.  120 tablet  4  . mometasone (ELOCON) 0.1 % lotion Apply topically daily as needed. Use as  directed  30 mL  5  . ondansetron (ZOFRAN-ODT) 8 MG disintegrating tablet Take 1 tablet (8 mg total) by mouth every 8 (eight) hours as needed for nausea.  20 tablet  0  . pantoprazole (PROTONIX) 40 MG tablet Take 1 tablet (40 mg total) by mouth daily.  30 tablet  6  . warfarin (COUMADIN) 4 MG tablet Take 2-4 mg by mouth daily. 2mg  on Mon.,Wed.,Fri., and 4mg  all other days       No current facility-administered medications for this encounter.    Physical Findings: The patient is in no acute distress. Patient is alert and oriented.  height is 5\' 8"  (1.727 m) and weight is 159 lb 1.6 oz (72.167 kg). Her temperature is 98.6 F (37 C). Her blood pressure is 150/74 and her pulse is 68. Her oxygen saturation is 98%. .  No palpable supraclavicular or axillary adenopathy. The lungs are clear to auscultation. The heart has a regular rhythm and rate. Palpation and percussion along the spine area reveals no obvious point tenderness. Patient points to pain in the region of the upper lumbar spine. She also has some tenderness with palpation along the right iliac crest area. On neurological examination motor strength is 5 out of 5 in the proximal and distal muscle groups of the lower extremities.  Lab Findings: Lab Results  Component Value Date  WBC 6.4 05/04/2013   HGB 13.2 05/04/2013   HCT 38.9 05/04/2013   MCV 84.4 05/04/2013   PLT 273 05/04/2013      Radiographic Findings: Mr Cervical Spine W Wo Contrast  05/14/2013   CLINICAL DATA:  Metastatic breast cancer.  EXAM: MRI CERVICAL SPINE WITHOUT AND WITH CONTRAST  TECHNIQUE: Multiplanar and multiecho pulse sequences of the cervical spine, to include the craniocervical junction and cervicothoracic junction, were obtained according to standard protocol without and with intravenous contrast.  CONTRAST:  14 cc MultiHance  COMPARISON:  Cervical spine MRI 09/02/2012 and whole body bone scan 12/15/2012.  FINDINGS: Normal overall alignment of the cervical vertebral  bodies. Stable degenerative cervical spondylosis. Slightly progressive metastatic disease involving the cervical spine with new small lesions and C3, C4 and C6. Stable upper thoracic lesions. The cervical spinal cord demonstrates normal signal intensity. No cord lesions or areas of abnormal cord enhancement.  Stable broad-based disc protrusion at C3-4 and bulging discs and osteophytic ridging at C4-5, C5-6 and C6-7. No significant spinal or foraminal stenosis.  Stable soft tissue density behind the T2 vertebral body. No enhancement.  IMPRESSION: Slightly progressive osseous metastatic disease involving the cervical spine.  Stable broad-based disc protrusion at C3-4 and degenerative cervical spondylosis at other levels without significant spinal or foraminal stenosis.  No cord lesions or abnormal cord enhancement.   Electronically Signed   By: Kalman Jewels M.D.   On: 05/14/2013 13:32   Mr Thoracic Spine W Wo Contrast  05/14/2013   CLINICAL DATA:  Back pain.  Known osseous metastatic disease.  EXAM: MRI THORACIC SPINE WITHOUT AND WITH CONTRAST  TECHNIQUE: Multiplanar and multiecho pulse sequences of the thoracic spine were obtained without and with intravenous contrast.  CONTRAST:  14 cc MultiHance  COMPARISON:  05/12/2012.  FINDINGS: Significant progression of metastatic disease involving the thoracic spine. Enlarging lesions noted and T2, T3, T9 and T11. There also multiple new lesions. No pathologic compression fracture and no extraosseous tumor. The thoracic spinal cord demonstrates normal signal intensity. No abnormal cord enhancement. No canal compromise.  Multilevel thoracic disc protrusions are again demonstrated to lumbar spine are most notable at T2-3, T3-4, T5-6, T6-7 and T7-8. No foraminal lesions. The conus medullaris terminates at T12-L1. The facets are normally aligned.  IMPRESSION: Progressive thoracic spine metastatic disease. No pathologic compression fracture or extraosseous tumor. No cord  lesions.  Multilevel thoracic disc protrusions, not significantly changed   Electronically Signed   By: Kalman Jewels M.D.   On: 05/14/2013 13:40   Mr Lumbar Spine W Wo Contrast  05/14/2013   CLINICAL DATA:  Back pain.  Known metastatic disease.  EXAM: MRI LUMBAR SPINE WITHOUT AND WITH CONTRAST  TECHNIQUE: Multiplanar and multiecho pulse sequences of the lumbar spine were obtained without and with intravenous contrast.  CONTRAST:  25mL MULTIHANCE GADOBENATE DIMEGLUMINE 529 MG/ML IV SOLN  COMPARISON:  12/15/2012  FINDINGS: Progressive osseous metastatic disease involving the lumbar spine with progressive involvement of L1 and L2. The L4 lesion appears stable. No new lesions in L3 or L5. The conus medullaris terminates at T12-L1. No cord lesions. The spinal canal is generous and there is no significant spinal or foraminal stenosis. There is a stable broad-based disc protrusion with central focality at L3-4. Moderate facet disease but no pars defects.  IMPRESSION: Progressive metastatic disease in the lumbar spine mainly involving L1 and L2. The L4 disease appears stable. No new lesions in L3 or L5. No pathologic fracture or extraosseous tumor.  Broad-based  disc protrusion with central focality at L3-4.   Electronically Signed   By: Kalman Jewels M.D.   On: 05/14/2013 13:44    Impression:  The patient would be a candidate for additional radiation therapy. Initially my impression was this area of progression in the upper lumbar spine had been previously irradiated. Discussion over the phone today with our [facility] clinic reveals that her treatments were directed at the lower lumbar spine and sacrum region. I will review the patient's films later this week to make a final determination concerning this issue. If this area has not been treated before then the patient would be a candidate for conventional external beam treatments. She would like to receive this additional therapy at our [facility] clinic.  If this area  has been previously irradiated then the patient may be a candidate for IMRT or possibly SRS treatment/SBT treatment which would need to be performed here in [place].  Plan:  Final treatment details are pending review of the patient's previous radiation records.  ____________________________________ Blair Promise, MD

## 2013-05-28 ENCOUNTER — Encounter: Payer: Self-pay | Admitting: *Deleted

## 2013-05-28 NOTE — Progress Notes (Signed)
[facility] Psychosocial Distress Screening Clinical Social Work  Clinical Social Work was referred by distress screening protocol.  The patient scored a 8 on the Psychosocial Distress Thermometer which indicates severe distress. Clinical Social Worker phoned Pt at home to assess for distress and other psychosocial needs. Pt not home and does not return to [facility] for several weeks. CSW left contact info for Pt to return call.    Clinical Social Worker follow up needed: yes  If yes, follow up plan: CSW awaits return call and will continue to try to see Pt.   Loren Racer, LCSW Clinical Social Worker [facility] Wednesday, Thursday and Friday Phone: [phone] Fax: [phone]

## 2013-06-15 ENCOUNTER — Telehealth: Payer: Self-pay | Admitting: Oncology

## 2013-06-15 ENCOUNTER — Other Ambulatory Visit (HOSPITAL_BASED_OUTPATIENT_CLINIC_OR_DEPARTMENT_OTHER): Payer: Medicare PPO

## 2013-06-15 ENCOUNTER — Ambulatory Visit (HOSPITAL_BASED_OUTPATIENT_CLINIC_OR_DEPARTMENT_OTHER): Payer: Medicare PPO | Admitting: Oncology

## 2013-06-15 VITALS — BP 154/83 | HR 79 | Temp 97.9°F | Resp 18 | Ht 68.0 in | Wt 157.7 lb

## 2013-06-15 DIAGNOSIS — C50519 Malignant neoplasm of lower-outer quadrant of unspecified female breast: Secondary | ICD-10-CM

## 2013-06-15 DIAGNOSIS — C7951 Secondary malignant neoplasm of bone: Principal | ICD-10-CM

## 2013-06-15 DIAGNOSIS — C50919 Malignant neoplasm of unspecified site of unspecified female breast: Secondary | ICD-10-CM

## 2013-06-15 DIAGNOSIS — C773 Secondary and unspecified malignant neoplasm of axilla and upper limb lymph nodes: Secondary | ICD-10-CM

## 2013-06-15 DIAGNOSIS — Z170 Estrogen receptor positive status [ER+]: Secondary | ICD-10-CM

## 2013-06-15 DIAGNOSIS — C50419 Malignant neoplasm of upper-outer quadrant of unspecified female breast: Secondary | ICD-10-CM

## 2013-06-15 DIAGNOSIS — C7952 Secondary malignant neoplasm of bone marrow: Secondary | ICD-10-CM

## 2013-06-15 LAB — LACTATE DEHYDROGENASE: LDH: 176 U/L (ref 94–250)

## 2013-06-15 LAB — CBC WITH DIFFERENTIAL/PLATELET
BASO%: 0.2 % (ref 0.0–2.0)
Basophils Absolute: 0 10*3/uL (ref 0.0–0.1)
EOS ABS: 0.1 10*3/uL (ref 0.0–0.5)
EOS%: 1.6 % (ref 0.0–7.0)
HEMATOCRIT: 40.7 % (ref 34.8–46.6)
HGB: 13.6 g/dL (ref 11.6–15.9)
LYMPH#: 0.4 10*3/uL — AB (ref 0.9–3.3)
LYMPH%: 6.8 % — ABNORMAL LOW (ref 14.0–49.7)
MCH: 29.9 pg (ref 25.1–34.0)
MCHC: 33.4 g/dL (ref 31.5–36.0)
MCV: 89.5 fL (ref 79.5–101.0)
MONO#: 0.7 10*3/uL (ref 0.1–0.9)
MONO%: 11.1 % (ref 0.0–14.0)
NEUT%: 80.3 % — ABNORMAL HIGH (ref 38.4–76.8)
NEUTROS ABS: 4.9 10*3/uL (ref 1.5–6.5)
PLATELETS: 314 10*3/uL (ref 145–400)
RBC: 4.55 10*6/uL (ref 3.70–5.45)
RDW: 15.9 % — ABNORMAL HIGH (ref 11.2–14.5)
WBC: 6.2 10*3/uL (ref 3.9–10.3)

## 2013-06-15 LAB — CANCER ANTIGEN 27.29: CA 27.29: 92 U/mL — ABNORMAL HIGH (ref 0–39)

## 2013-06-15 LAB — COMPREHENSIVE METABOLIC PANEL
ALK PHOS: 58 U/L (ref 39–117)
ALT: 10 U/L (ref 0–35)
AST: 16 U/L (ref 0–37)
Albumin: 4 g/dL (ref 3.5–5.2)
BILIRUBIN TOTAL: 0.4 mg/dL (ref 0.2–1.2)
BUN: 14 mg/dL (ref 6–23)
CALCIUM: 8.9 mg/dL (ref 8.4–10.5)
CO2: 26 mEq/L (ref 19–32)
CREATININE: 0.67 mg/dL (ref 0.50–1.10)
Chloride: 108 mEq/L (ref 96–112)
Glucose, Bld: 87 mg/dL (ref 70–99)
Potassium: 3.5 mEq/L (ref 3.5–5.3)
Sodium: 143 mEq/L (ref 135–145)
Total Protein: 6.7 g/dL (ref 6.0–8.3)

## 2013-06-15 LAB — PROTIME-INR
INR: 2.3 (ref 2.00–3.50)
PROTIME: 27.6 s — AB (ref 10.6–13.4)

## 2013-06-15 MED ORDER — HYDROCODONE-ACETAMINOPHEN 10-325 MG PO TABS: 1.0000 | ORAL_TABLET | ORAL | Status: DC | PRN

## 2013-06-15 NOTE — Progress Notes (Signed)
Hematology and Oncology Follow Up Visit  [name] [id number] Sep 24, 1945 68 y.o. 06/15/2013 2:46 PM   Principle Diagnosis: Encounter Diagnosis  Name Primary?  . 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 Most recent salvage regimen was a combination of tamoxifen plus Affinitor started in February 2014. There were some signs of initial disease stabilization with a plateau in her tumor marker which remained stable without any further increase until November 2014 when it began to rise again. Concomitant with this, she developed increasing low back pain. She had had previous radiation to symptomatic disease in the lumbar spine. She was reevaluated with MRI scans of the spine on January 16. Progressive disease was confirmed. She was referred back to radiation oncology and she was felt to be a candidate for additional radiation which is currently in progress.  I stopped the Affinitor/tamoxifen and started her on a trial of Megace 80 mg twice daily in early November 2014. Unfortunately, her tumor marker continues to rise with most recent value 74 units on 05/04/2013. Today's values pending. She lost a lot of weight when she was on the Affinitor I cut the Megace would help with this as well as a appetite stimulant and it did help in this regard. Weight is up from 149 pounds in November to 158 pounds today. Still, just looking at her, she is losing muscle mass from her face. She is still having significant back pain. She had to go to the emergency department about a week ago due to an acute episode.  Her chronic GI complaints have also improved since coming off the Affinitor although she still has almost constant nausea.    Medications: reviewed  Allergies:  Allergies  Allergen Reactions  . Aspirin   . Percodan [Oxycodone-Aspirin] Nausea Only  .  Codeine Nausea Only    Review of Systems: Hematology:  No bleeding ENT ROS: No sore throat Breast ROS:  Respiratory ROS: No cough or dyspnea Cardiovascular ROS:  No chest pain or palpitations Gastrointestinal ROS: No abdominal pain. Chronic epigastric discomfort.   Genito-Urinary ROS: Not questioned Musculoskeletal ROS: See above Neurological ROS: No headache or change in vision Dermatological ROS: No rash Remaining ROS negative:   Physical Exam: Blood pressure 154/83, pulse 79, temperature 97.9 F (36.6 C), resp. rate 18, height _0  (1.727 m), weight 157 lb 11.2 oz (71.532 kg), SpO2 99.00%. Wt Readings from Last 3 Encounters:  06/15/13 157 lb 11.2 oz (71.532 kg)  05/19/13 159 lb 1.6 oz (72.167 kg)  05/04/13 152 lb 14.4 oz (69.355 kg)     General appearance: Thin Caucasian woman HENNT: Pharynx no erythema, exudate, mass, or ulcer. No thyromegaly or thyroid nodules Lymph nodes: No cervical, supraclavicular, or axillary lymphadenopathy Breasts: Bilateral mastectomies. No chest wall lesions Lungs: Clear to auscultation, resonant to percussion throughout Heart: Regular rhythm, no murmur, no gallop, no rub, no click, no edema Abdomen: Soft, nontender, normal bowel sounds, no mass, no organomegaly Extremities: No edema, no calf tenderness Musculoskeletal: no joint deformities GU:  Vascular: Carotid pulses 2+, no bruits, Neurologic: Alert, oriented, PERRLA,   cranial nerves grossly normal, motor strength 5 over 5, reflexes 1+ symmetric, upper body coordination normal, gait normal, Skin: No rash or ecchymosis  Lab Results: CBC W/Diff    Component Value Date/Time   WBC 6.2 06/15/2013 0956   WBC 8.8 06/27/2010 1558  RBC 4.55 06/15/2013 0956   RBC 4.52 06/27/2010 1558   HGB 13.6 06/15/2013 0956   HGB 13.6 06/27/2010 1558   HCT 40.7 06/15/2013 0956   HCT 40.6 06/27/2010 1558   PLT 314 06/15/2013 0956   PLT 272 06/27/2010 1558   MCV 89.5 06/15/2013 0956   MCV 89.8 06/27/2010 1558    MCH 29.9 06/15/2013 0956   MCH 30.1 06/27/2010 1558   MCHC 33.4 06/15/2013 0956   MCHC 33.5 06/27/2010 1558   RDW 15.9* 06/15/2013 0956   RDW 13.0 06/27/2010 1558   LYMPHSABS 0.4* 06/15/2013 0956   LYMPHSABS 1.0 06/27/2010 1558   MONOABS 0.7 06/15/2013 0956   MONOABS 1.0 06/27/2010 1558   EOSABS 0.1 06/15/2013 0956   EOSABS 0.2 06/27/2010 1558   BASOSABS 0.0 06/15/2013 0956   BASOSABS 0.0 06/27/2010 1558     Chemistry      Component Value Date/Time   NA 142 05/04/2013 0926   NA 141 09/24/2011 1420   K 4.3 05/04/2013 0926   K 4.0 09/24/2011 1420   CL 110* 09/02/2012 0814   CL 104 09/24/2011 1420   CO2 25 05/04/2013 0926   CO2 26 09/24/2011 1420   BUN 13.4 05/04/2013 0926   BUN 17 09/24/2011 1420   CREATININE 0.7 05/04/2013 0926   CREATININE 0.58 09/24/2011 1420      Component Value Date/Time   CALCIUM 9.1 05/04/2013 0926   CALCIUM 9.4 09/24/2011 1420   ALKPHOS 90 05/04/2013 0926   ALKPHOS 78 09/24/2011 1420   AST 16 05/04/2013 0926   AST 18 09/24/2011 1420   ALT 17 05/04/2013 0926   ALT 16 09/24/2011 1420   BILITOT 0.33 05/04/2013 0926   BILITOT 0.4 09/24/2011 1420         Impression:   #1. Complex:  ER-positive and ER negative metachronous primary bilateral breast cancers.  She has been through multiple lines of therapy with initial diagnosis right breast in December 2006,  7 node positive, ER/PR positive, treated with lumpectomy, radiation, and chemotherapy with sequential Adriamycin Cytoxan and Taxotere.. Progression in bone while on tamoxifen September 2011. Initial complete response to monthly Faslodex injections.She developed a malignant left axillary lymph node with no clear primary in the left breast November 2011. This tumor was ER negative. HER-2 negative. While under evaluation for this lesion she developed a new lesion in the right breast which was ER positive. She underwent bilateral mastectomies 06/28/2010. She was continued on hormonal therapy with monthly Faslodex injections in view of the ER  positive component of her tumor with a complete response to this hormone in bone. She underwent radiation to the left chest and axilla subsequent to the mastectomies.  She developed a cutaneous recurrence on the right chest wall along the medial aspect of the right mastectomy scar, biopsy proven, initially detected on clinical exam by her radiation oncologist on 02/14/2011. She was treated with electron beam radiation.. 3000 cGy in 15 fractions through December 2012. Further progression in bone in August 2013. Palliative radiation to lumbosacral spine September through October 2013. Brief trial of Xeloda November 2013 through January 2014. Change to tamoxifen plus Affinitor February 2014 stopped for progression and 25 pound weight loss November 2014. Trial of Megace 80 mg twice a day with improved appetite but progressive rise in tumor marker and progressive low back pain with further progression on MRI of the spine done January 2015. Currently mid course radiation to the lumbar spine.  We addressed systemic treatment again today. She heard  about the new breast cancer oral drug that was just approved by the FDA 2 weeks ago [place]. Phase 3 trial showed dramatic results in combination with letrozole for previously untreated metastatic disease. Drug is taken daily x21 days with a 7 day rest period She had problems tolerating aromatase inhibitors in the past. However, she is willing to try the Ibrance/Femara  combination before we have to resort to a parenteral chemotherapy agent. I will make inquiries to see how quickly we can get the drug and work to get preauthorization from her insurance company.  If we are not able to obtain the new drug, I gave her a number of other alternatives including Abraxane, Eribulin, or Navelbine as the most user-friendly drugs with respect to drug schedule in a lady who lives at a distance from our center.  I will call her when I have more information about the new drug and we  will go from there.    Time spent over 45 minutes with exam, counseling, review of available therapies and coordination of care.  CC: Patient Care Team: Curlene Labrum as PCP - General Blair Promise, MD (Radiation Oncology) Daneil Dolin, MD (Gastroenterology) Annia Belt, MD (Hematology and Oncology)   Annia Belt, MD 2/17/20152:46 PM

## 2013-06-15 NOTE — Telephone Encounter (Signed)
gv pt appt schedule for march °

## 2013-06-23 ENCOUNTER — Encounter: Payer: Self-pay | Admitting: Oncology

## 2013-06-23 NOTE — Progress Notes (Signed)
Faxed femara and ibrance prescriptions to [facility]

## 2013-06-26 ENCOUNTER — Encounter: Payer: Self-pay | Admitting: Oncology

## 2013-06-29 ENCOUNTER — Telehealth: Payer: Self-pay | Admitting: *Deleted

## 2013-06-29 ENCOUNTER — Other Ambulatory Visit: Payer: Self-pay | Admitting: *Deleted

## 2013-06-29 DIAGNOSIS — C7952 Secondary malignant neoplasm of bone marrow: Secondary | ICD-10-CM

## 2013-06-29 DIAGNOSIS — C50919 Malignant neoplasm of unspecified site of unspecified female breast: Secondary | ICD-10-CM

## 2013-06-29 DIAGNOSIS — C7951 Secondary malignant neoplasm of bone: Secondary | ICD-10-CM

## 2013-06-29 MED ORDER — LETROZOLE 2.5 MG PO TABS
2.5000 mg | ORAL_TABLET | Freq: Every day | ORAL | Status: DC
Start: 2013-06-29 — End: 2013-11-19

## 2013-06-29 MED ORDER — INV-PALBOCICLIB 125 MG CAPS #23 SWOG S1400 SUB-STUDY C: 125.0000 mg | ORAL_CAPSULE | Freq: Every day | ORAL | Status: DC

## 2013-06-29 NOTE — Telephone Encounter (Signed)
Received notice from [facility] that prescription for Ibrance/Letrozole has been transferred to [facility] due to pt's insurance.

## 2013-06-29 NOTE — Telephone Encounter (Signed)
Received call from [facility] stating they were unable to fillscipts for ibrance & letrozole due to insurance & these scripts have been forwarded to [facility]. Later received call from [facility] asking to change # of letrozole to 28 to coincide with 21 days on of ibrance & 7 days off.  OK given

## 2013-07-01 ENCOUNTER — Encounter: Payer: Self-pay | Admitting: Oncology

## 2013-07-01 NOTE — Progress Notes (Signed)
[employer] approved ibrance 125mg  from 07/01/13-07/02/15

## 2013-07-02 ENCOUNTER — Telehealth: Payer: Self-pay | Admitting: *Deleted

## 2013-07-02 NOTE — Telephone Encounter (Signed)
Called pt to see if she has heard anything from [place] regarding [place].  She reports that she has not.  Called [facility] & they reports that they have a paid claim & insurance has approved & co-pay is $ 2081.78.  They were given permission to send this to their funding team & they will call pt.

## 2013-07-06 ENCOUNTER — Encounter: Payer: Self-pay | Admitting: Oncology

## 2013-07-06 NOTE — Progress Notes (Signed)
Called [facility] about femara and imbrance; they had to transfer prescriptions to [facility] pharmacy due to her insurance.  [facility] has been trying to contact the patient to help her with copay assistance for the imbrance, which is $2801.78; they will call her back today.

## 2013-07-07 ENCOUNTER — Telehealth: Payer: Self-pay | Admitting: *Deleted

## 2013-07-07 NOTE — Telephone Encounter (Signed)
[facility] called and left a message and I then left a message for patient that medication has been processed - both imbrance and letrazole -and should be delivered tomorrow 07/08/13.

## 2013-07-09 NOTE — Telephone Encounter (Signed)
Received RX communication report from [facility] stating that prescription was processed at pharmacy & will be shipped to pt on 07/08/13.

## 2013-07-13 ENCOUNTER — Encounter: Payer: Self-pay | Admitting: Internal Medicine

## 2013-07-13 ENCOUNTER — Ambulatory Visit (INDEPENDENT_AMBULATORY_CARE_PROVIDER_SITE_OTHER): Payer: Medicare PPO | Admitting: Internal Medicine

## 2013-07-13 VITALS — BP 140/76 | HR 71 | Temp 97.4°F | Ht 68.0 in | Wt 157.4 lb

## 2013-07-13 DIAGNOSIS — R110 Nausea: Secondary | ICD-10-CM

## 2013-07-13 DIAGNOSIS — K219 Gastro-esophageal reflux disease without esophagitis: Secondary | ICD-10-CM

## 2013-07-13 NOTE — Progress Notes (Signed)
Primary Care Physician:  Curlene Labrum, MD Primary Gastroenterologist:  Dr. [name]  Pre-Procedure History & Physical: HPI:  [name] is a 68 y.o. female with metastatic breast cancer to bone here for for followup of constipation, GERD and nausea. More bad news recently in that her spine lesions have progressed as she reports. She is really not having any abdominal pain these days. She is more nauseated/no vomiting. She is going on a new chemotherapeutic regimen (see below). She is afraid to take Zofran because she saw on TV how you can damage a fetus. Reflux symptoms well controlled by Protonix 40 mg daily.  Appetite is so-so. Her weight is down by 6 pounds since her last visit. She continues to work at [employer]. With her recent change in chemotherapeutic regimen, her bowel function has normalized and she has not needed to take anything recently. Past Medical History  Diagnosis Date  . Cancer     RBreast, Lower back , Under l Arm  . Hypertension   . GERD (gastroesophageal reflux disease)   . Breast cancer metastasized to bone 04/30/2011  . Radiation 01/15/2012    Lumbosacral spine 3500 cGy 14  fx  . Swollen R ankle 08/10/2012  . Fracture of ankle, medial malleolus, right, closed 08/10/2012  . Tubular adenoma 2011  . Hiatal hernia 2011  . Schatzki's ring   . Anorexia 03/02/2013  . Weight loss due to medication 03/02/2013    Past Surgical History  Procedure Laterality Date  . Gallbladder surgery  1991  . Mastectomy  06/28/10    bilateral- Dr. Rosebud Poles, [place]  . Inguinal hernia repair  90's    right  . Parathyroidectomy  90's  . Carpal tunnel release  90's  . Oophorectomy  90's    right  . Lymph node dissection    . Breast surgery    . Cholecystectomy    . Colonoscopy  01/08/10    Dr. [name]- anal tag,hemorrhoid o/w normal rectum, pancolonic diverticula, diminutive polyp in the base of the cecum= tubular adenoma on bx. poor prep  .  Esophagogastroduodenoscopy  01/08/10    Dr. [name]- normal esophagus, small hiatal hernia, antum and body erosions, pedunculated polyp between D1 and D2, duodenal diverticulum, lymphangiectasia, second portion of the duodenum.stomach bx= inflammation, duodenum bx= adenoma  . Rotator cuff repair  08/27/11    Dr. Alphonzo Cruise- [facility]- [facility]  . Esophagogastroduodenoscopy (egd) with propofol N/A 11/19/2012    RMR: non-critical Schatzki's ring s/p dilation with 54 F, multiple gastric polyps, duodenal polyp s/p piecemeal snare polypectomy, gastric polypectomy, path benign  . Maloney dilation N/A 11/19/2012    Procedure: Venia Minks DILATION;  Surgeon: Daneil Dolin, MD;  Location: [facility];  Service: Endoscopy;  Laterality: N/A;  #54  . Polypectomy N/A 11/19/2012    Procedure: POLYPECTOMY;  Surgeon: Daneil Dolin, MD;  Location: [facility];  Service: Endoscopy;  Laterality: N/A;  duodenal  . Esophageal biopsy N/A 11/19/2012    Procedure: BIOPSY;  Surgeon: Daneil Dolin, MD;  Location: [facility];  Service: Endoscopy;  Laterality: N/A;  esophageal    Prior to Admission medications   Medication Sig Start Date End Date Taking? Authorizing Provider  cholecalciferol (VITAMIN D) 1000 UNITS tablet Take 2,000 Units by mouth daily.   Yes Historical Provider, MD  diphenoxylate-atropine (LOMOTIL) 2.5-0.025 MG per tablet Take 2 tablets by mouth as needed for diarrhea or loose stools.  01/23/12  Yes Historical Provider, MD  Homeopathic Products (ARNICA) GEL Apply topically  every morning. Feet, legs to the knees   Yes Historical Provider, MD  HYDROcodone-acetaminophen (NORCO) 10-325 MG per tablet Take 1-2 tablets by mouth every 4 (four) hours as needed. 06/18/13  Yes Annia Belt, MD  Investigational palbociclib 125 MG capsule SWOG S1400 Sub-Study C Take 1 capsule (125 mg total) by mouth daily. Take with food. Swallow whole. Do not chew.  Take daily x 21 days then rest 7 days--This is given with letrozole. 06/29/13  Yes Annia Belt, MD  letrozole [facility]) 2.5 MG tablet Take 1 tablet (2.5 mg total) by mouth daily. 06/29/13  Yes Annia Belt, MD  lisinopril (PRINIVIL,ZESTRIL) 20 MG tablet Take 20 mg by mouth daily.  10/08/10  Yes Historical Provider, MD  mometasone (ELOCON) 0.1 % lotion Apply topically daily as needed. Use as directed 06/29/12  Yes Annia Belt, MD  ondansetron (ZOFRAN-ODT) 8 MG disintegrating tablet Take 1 tablet (8 mg total) by mouth every 8 (eight) hours as needed for nausea. 03/19/13  Yes Annia Belt, MD  pantoprazole (PROTONIX) 20 MG tablet Take 20 mg by mouth every other day.   Yes Historical Provider, MD  UNABLE TO FIND Ibrance 125 mg daily   Yes Historical Provider, MD  warfarin (COUMADIN) 4 MG tablet Take 2-4 mg by mouth daily. 2mg  on Mon.,Wed.,Fri., and 4mg  all other days 08/16/10  Yes Historical Provider, MD  megestrol (MEGACE) 40 MG tablet Take 2 tablets (80 mg total) by mouth 2 (two) times daily. 03/02/13   Annia Belt, MD    Allergies as of 07/13/2013 - Review Complete 07/13/2013  Allergen Reaction Noted  . Aspirin  11/13/2012  . Percodan [oxycodone-aspirin] Nausea Only   . Codeine Nausea Only     Family History  Problem Relation Age of Onset  . Cancer Mother     Colon  . Cancer Father     Lung, Mouth,leg    History   Social History  . Marital Status: Widowed    Spouse Name: N/A    Number of Children: N/A  . Years of Education: N/A   Occupational History  . Not on file.   Social History Main Topics  . Smoking status: Former Smoker    Quit date: 11/22/1982  . Smokeless tobacco: Not on file  . Alcohol Use: No  . Drug Use: Not on file  . Sexual Activity: Not on file   Other Topics Concern  . Not on file   Social History Narrative  . No narrative on file    Review of Systems: See HPI, otherwise negative ROS  Physical Exam: BP 140/76  Pulse 71  Temp(Src) 97.4 F (36.3 C) (Oral)  Ht 5\' 8"  (1.727 m)  Wt 157 lb 6.4 oz (71.396  kg)  BMI 23.94 kg/m2 General:   Alert,  somewhat chronically ill lady who is here with her work apron on  Skin:  Intact without significant lesions or rashes. Eyes:  Sclera clear, no icterus.   Conjunctiva pink. Ears:  Normal auditory acuity. Nose:  No deformity, discharge,  or lesions. Mouth:  No deformity or lesions. Neck:  Supple; no masses or thyromegaly. No significant cervical adenopathy. Lungs:  Clear throughout to auscultation.   No wheezes, crackles, or rhonchi. No acute distress. Heart:  Regular rate and rhythm; no murmurs, clicks, rubs,  or gallops. Abdomen: Nondistended. Positive bowel sounds soft nontender no appreciable mass or organomegaly Pulses:  Normal pulses noted. Extremities:  Without clubbing or edema.  Impression:  Pleasant  68 year old lady with progressive of breast cancer with bone metastases. Recent change in her chemotherapy regimen. Bowel function is improved. GERD symptoms controlled on Protonix. However, she's had significant nausea but no vomiting. She is afraid to take Zofran.  She has lost some weight since her last visit.  Recommendations:  I  re-assured her regarding the safety profile of Zofran and I have encouraged her to take 8 mg each morning as a scheduled medication until her nausea settles down. Hopefully, this regimen will facilitate her oral intake .  She is to continue taking Protonix 40 mg daily chronically.  Will plan to see her back in 8 weeks.

## 2013-07-13 NOTE — Patient Instructions (Addendum)
Take Zofran 8 mg each morning  Continue Protonix 40 mg once daily  Office visit here in 8 weeks

## 2013-07-14 ENCOUNTER — Telehealth: Payer: Self-pay | Admitting: Oncology

## 2013-07-14 ENCOUNTER — Other Ambulatory Visit (HOSPITAL_BASED_OUTPATIENT_CLINIC_OR_DEPARTMENT_OTHER): Payer: Medicare PPO

## 2013-07-14 ENCOUNTER — Ambulatory Visit (HOSPITAL_BASED_OUTPATIENT_CLINIC_OR_DEPARTMENT_OTHER): Payer: Medicare PPO | Admitting: Oncology

## 2013-07-14 ENCOUNTER — Other Ambulatory Visit: Payer: Self-pay | Admitting: *Deleted

## 2013-07-14 VITALS — BP 145/74 | HR 73 | Temp 98.4°F | Resp 18 | Ht 68.0 in | Wt 156.3 lb

## 2013-07-14 DIAGNOSIS — C50919 Malignant neoplasm of unspecified site of unspecified female breast: Secondary | ICD-10-CM

## 2013-07-14 DIAGNOSIS — Z8672 Personal history of thrombophlebitis: Secondary | ICD-10-CM

## 2013-07-14 DIAGNOSIS — R634 Abnormal weight loss: Secondary | ICD-10-CM

## 2013-07-14 DIAGNOSIS — Z853 Personal history of malignant neoplasm of breast: Secondary | ICD-10-CM

## 2013-07-14 DIAGNOSIS — C7952 Secondary malignant neoplasm of bone marrow: Secondary | ICD-10-CM

## 2013-07-14 DIAGNOSIS — R630 Anorexia: Secondary | ICD-10-CM

## 2013-07-14 DIAGNOSIS — Z7901 Long term (current) use of anticoagulants: Secondary | ICD-10-CM

## 2013-07-14 DIAGNOSIS — T50905A Adverse effect of unspecified drugs, medicaments and biological substances, initial encounter: Secondary | ICD-10-CM

## 2013-07-14 DIAGNOSIS — R110 Nausea: Secondary | ICD-10-CM

## 2013-07-14 DIAGNOSIS — M545 Low back pain, unspecified: Secondary | ICD-10-CM

## 2013-07-14 DIAGNOSIS — C7951 Secondary malignant neoplasm of bone: Secondary | ICD-10-CM

## 2013-07-14 DIAGNOSIS — C50519 Malignant neoplasm of lower-outer quadrant of unspecified female breast: Secondary | ICD-10-CM

## 2013-07-14 DIAGNOSIS — C773 Secondary and unspecified malignant neoplasm of axilla and upper limb lymph nodes: Secondary | ICD-10-CM

## 2013-07-14 DIAGNOSIS — I809 Phlebitis and thrombophlebitis of unspecified site: Secondary | ICD-10-CM

## 2013-07-14 LAB — CBC WITH DIFFERENTIAL/PLATELET
BASO%: 0.6 % (ref 0.0–2.0)
BASOS ABS: 0 10*3/uL (ref 0.0–0.1)
EOS%: 1 % (ref 0.0–7.0)
Eosinophils Absolute: 0.1 10*3/uL (ref 0.0–0.5)
HEMATOCRIT: 36.4 % (ref 34.8–46.6)
HEMOGLOBIN: 12.1 g/dL (ref 11.6–15.9)
LYMPH#: 0.3 10*3/uL — AB (ref 0.9–3.3)
LYMPH%: 3.8 % — AB (ref 14.0–49.7)
MCH: 30.1 pg (ref 25.1–34.0)
MCHC: 33.3 g/dL (ref 31.5–36.0)
MCV: 90.5 fL (ref 79.5–101.0)
MONO#: 0.2 10*3/uL (ref 0.1–0.9)
MONO%: 2.4 % (ref 0.0–14.0)
NEUT#: 6.7 10*3/uL — ABNORMAL HIGH (ref 1.5–6.5)
NEUT%: 92.2 % — AB (ref 38.4–76.8)
PLATELETS: 301 10*3/uL (ref 145–400)
RBC: 4.03 10*6/uL (ref 3.70–5.45)
RDW: 14.5 % (ref 11.2–14.5)
WBC: 7.2 10*3/uL (ref 3.9–10.3)

## 2013-07-14 LAB — COMPREHENSIVE METABOLIC PANEL (CC13)
ALT: 11 U/L (ref 0–55)
AST: 15 U/L (ref 5–34)
Albumin: 3.1 g/dL — ABNORMAL LOW (ref 3.5–5.0)
Alkaline Phosphatase: 70 U/L (ref 40–150)
Anion Gap: 9 mEq/L (ref 3–11)
BILIRUBIN TOTAL: 0.42 mg/dL (ref 0.20–1.20)
BUN: 14.7 mg/dL (ref 7.0–26.0)
CALCIUM: 9.1 mg/dL (ref 8.4–10.4)
CHLORIDE: 107 meq/L (ref 98–109)
CO2: 27 mEq/L (ref 22–29)
CREATININE: 0.7 mg/dL (ref 0.6–1.1)
Glucose: 103 mg/dl (ref 70–140)
Potassium: 3.8 mEq/L (ref 3.5–5.1)
Sodium: 143 mEq/L (ref 136–145)
Total Protein: 6.5 g/dL (ref 6.4–8.3)

## 2013-07-14 LAB — LACTATE DEHYDROGENASE (CC13): LDH: 174 U/L (ref 125–245)

## 2013-07-14 LAB — PROTIME-INR
INR: 2.1 (ref 2.00–3.50)
Protime: 25.2 Seconds — ABNORMAL HIGH (ref 10.6–13.4)

## 2013-07-14 MED ORDER — ONDANSETRON 8 MG PO TBDP: 8.0000 mg | ORAL_TABLET | Freq: Three times a day (TID) | ORAL | Status: DC | PRN

## 2013-07-14 MED ORDER — HYDROCODONE-ACETAMINOPHEN 10-325 MG PO TABS: 1.0000 | ORAL_TABLET | ORAL | Status: DC | PRN

## 2013-07-14 NOTE — Telephone Encounter (Signed)
Gave pt apt for lab and MD for May 2015, 1st opening for Dr. [name] ,a former Dr. Beryle Beams pt

## 2013-07-14 NOTE — Progress Notes (Signed)
Hematology and Oncology Follow Up Visit  [name] [id number] [birth year] 69 y.o. 07/14/2013 1:56 PM   Principle Diagnosis: Encounter Diagnoses  Name Primary?  . Breast cancer metastasized to bone Yes  . NAUSEA   . Anorexia   . 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 Most recent salvage regimen was a combination of tamoxifen plus Affinitor started in February 2014. There were some signs of initial disease stabilization with a plateau in her tumor marker which remained stable without any further increase until November 2014 when it began to rise again. Concomitant with this, she developed increasing low back pain. She had had previous radiation to symptomatic disease in the lumbar spine. She was reevaluated with MRI scans of the spine on January 16. Progressive disease was confirmed. She was referred back to radiation oncology and she was felt to be a candidate for additional radiation which was given in the evening in late January/early February.She has started to get some relief from the radiation treatments.she still has to use large amounts of Percocet which is the only narcotic that she can withstand. I stopped the Affinitor/tamoxifen and started her on a trial of Megace 80 mg twice daily in early November 2014. Unfortunately, her tumor marker continued to rise with most recent value 92 units on 06/15/13.  She lost a lot of weight when she was on the Affinitor I hoped the Megace would help with this as well as a appetite stimulant and it did help in this regard. Weight  up from 149 pounds in November to 158 pounds by 2/17.  Unfortunately her tumor marker continued to rise on the Megace and it was stopped. At time of her recent February 17 visit Ibrance just received FDA approval. I was able to get this drug for her and her insurance will reimburse  her for a period she started the [place] 5 days ago along with Femara. [name] is taken for 21 days of a 28 day cycle, Femara daily with no breaks. She's had some mild diarrhea. She still have an almost constant nausea which usually starts in the afternoon. She just saw her gastroenterologist yesterday. He put her back on Protonix. She did not tolerate excellent. He also recommended that she take a Zofran when she gets up in the morning so that she gets better control of the nausea during the day.  She has developed some breakthrough vaginal bleeding since she stopped the Megace.  Medications: reviewed  Allergies:  Allergies  Allergen Reactions  . Aspirin   . Percodan [Oxycodone-Aspirin] Nausea Only  . Codeine Nausea Only    Review of Systems: Hematology:  No bleeding or bruising ENT ROS: no sore throat Breast ROS:  Respiratory ROS: no cough or dyspnea Cardiovascular ROS:  No chest pain or palpitations Gastrointestinal ROS: chronic nausea   Genito-Urinary ROS:not questioned  Musculoskeletal ROS:see above  Neurological ROS: no headache or change in vision Dermatological ROS: no rash Remaining ROS negative:   Physical Exam: Blood pressure 145/74, pulse 73, temperature 98.4 F (36.9 C), temperature source Oral, resp. rate 18, height _0  (1.727 m), weight 156 lb 4.8 oz (70.897 kg), SpO2 97.00%. Wt Readings from Last 3 Encounters:  07/14/13 156 lb 4.8 oz (70.897 kg)  07/13/13 157 lb 6.4 oz (71.396 kg)  06/15/13 157 lb 11.2 oz (71.532 kg)     General  appearance: thin Caucasian woman who has lost muscle mass in her face. HENNT: Pharynx no erythema, exudate, mass, or ulcer. No thyromegaly or thyroid nodules Lymph nodes: No cervical, supraclavicular, or axillary lymphadenopathy Breasts: bilateral mastectomies. No active chest wall lesions. Lungs: Clear to auscultation, resonant to percussion throughout Heart: Regular rhythm, no murmur, no gallop, no rub, no click, no  edema Abdomen: Soft, nontender, normal bowel sounds, no mass, no organomegaly Extremities: No edema, no calf tenderness Musculoskeletal: no joint deformities GU:  Vascular: Carotid pulses 2+, no bruits,  Neurologic: Alert, oriented, PERRLA,  , cranial nerves grossly normal, motor strength 5 over 5, reflexes 1+ symmetric, upper body coordination normal, gait normal, Skin: No rash or ecchymosis  Lab Results: CBC W/Diff    Component Value Date/Time   WBC 7.2 07/14/2013 1123   WBC 8.8 06/27/2010 1558   RBC 4.03 07/14/2013 1123   RBC 4.52 06/27/2010 1558   HGB 12.1 07/14/2013 1123   HGB 13.6 06/27/2010 1558   HCT 36.4 07/14/2013 1123   HCT 40.6 06/27/2010 1558   PLT 301 07/14/2013 1123   PLT 272 06/27/2010 1558   MCV 90.5 07/14/2013 1123   MCV 89.8 06/27/2010 1558   MCH 30.1 07/14/2013 1123   MCH 30.1 06/27/2010 1558   MCHC 33.3 07/14/2013 1123   MCHC 33.5 06/27/2010 1558   RDW 14.5 07/14/2013 1123   RDW 13.0 06/27/2010 1558   LYMPHSABS 0.3* 07/14/2013 1123   LYMPHSABS 1.0 06/27/2010 1558   MONOABS 0.2 07/14/2013 1123   MONOABS 1.0 06/27/2010 1558   EOSABS 0.1 07/14/2013 1123   EOSABS 0.2 06/27/2010 1558   BASOSABS 0.0 07/14/2013 1123   BASOSABS 0.0 06/27/2010 1558     Chemistry      Component Value Date/Time   NA 143 07/14/2013 1123   NA 143 06/15/2013 0958   K 3.8 07/14/2013 1123   K 3.5 06/15/2013 0958   CL 108 06/15/2013 0958   CL 110* 09/02/2012 0814   CO2 27 07/14/2013 1123   CO2 26 06/15/2013 0958   BUN 14.7 07/14/2013 1123   BUN 14 06/15/2013 0958   CREATININE 0.7 07/14/2013 1123   CREATININE 0.67 06/15/2013 0958      Component Value Date/Time   CALCIUM 9.1 07/14/2013 1123   CALCIUM 8.9 06/15/2013 0958   ALKPHOS 70 07/14/2013 1123   ALKPHOS 58 06/15/2013 0958   AST 15 07/14/2013 1123   AST 16 06/15/2013 0958   ALT 11 07/14/2013 1123   ALT 10 06/15/2013 0958   BILITOT 0.42 07/14/2013 1123   BILITOT 0.4 06/15/2013 0958        Impression:  Cancer treatment summary:  Complex 68 year old  woman with history of ER-positive and ER negative metachronous primary bilateral breast cancers.  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 Progression in bone while on tamoxifen September 2011. Initial complete response to monthly Faslodex injections.She developed a malignant left axillary lymph node with no clear primary in the left breast November 2011. This tumor was ER negative. HER-2 negative. While under evaluation for this lesion she developed a new lesion in the right breast which was ER positive. She underwent bilateral mastectomies 06/28/2010. She was continued on hormonal therapy with monthly Faslodex injections in view of the ER positive component of her tumor with a complete response to this hormone in bone. She underwent radiation to the left chest and  axilla subsequent to the mastectomies. She developed a cutaneous recurrence on the right chest wall along the medial aspect of the right mastectomy scar, biopsy proven, initially detected on clinical exam by her radiation oncologist on 02/14/2011. She was treated with electron beam radiation.. 3000 cGy in 15 fractions through December 2012. Further progression in bone in August 2013. Palliative radiation to lumbosacral spine September through October 2013. Brief trial of Xeloda November 2013 through January 2014. Change to tamoxifen plus Affinitor February 2014 stopped for progression and 25 pound weight loss November 2014. Trial of Megace 80 mg twice a day with improved appetite but progressive rise in tumor marker and progressive low back pain with further progression on MRI of the spine done January 2015.  Radiation to the lumbar spine February 2015.Marland Kitchen She started most recent salvage regimen with a combination of Ibrance plus Femara on 07/08/2013. It is too early to assess  response.   #2. Chronic GI complaints.   She did not tolerate dexilant She is back on Protonix. Closely followed by Dr Sydell Axon   #3. Chronic orthopedic problems with chronic pain of her feet.   #4. Status post right rotator cuff repair   #5. History of extensive superficial phlebitis on chronic low-dose Coumadin anticoagulation.  I will transition her care to Dr. [name]   CC: Patient Care Team: Curlene Labrum as PCP - General Blair Promise, MD (Radiation Oncology) Daneil Dolin, MD (Gastroenterology) Annia Belt, MD (Hematology and Oncology)   Annia Belt, MD 3/18/20151:56 PM

## 2013-07-15 LAB — CANCER ANTIGEN 27.29: CA 27.29: 73 U/mL — ABNORMAL HIGH (ref 0–39)

## 2013-07-16 ENCOUNTER — Other Ambulatory Visit: Payer: Self-pay | Admitting: *Deleted

## 2013-07-16 ENCOUNTER — Other Ambulatory Visit: Payer: Self-pay | Admitting: Oncology

## 2013-07-16 MED ORDER — ONDANSETRON 8 MG PO TBDP: 8.0000 mg | ORAL_TABLET | Freq: Three times a day (TID) | ORAL | Status: DC | PRN

## 2013-07-21 ENCOUNTER — Telehealth: Payer: Self-pay | Admitting: *Deleted

## 2013-07-21 NOTE — Telephone Encounter (Signed)
Per Dr. Beryle Beams; notified pt that tumor marker already trending down from 92 to 73.  Pt verbalized understanding and requested 5/12 appt with Dr. [name] to be moved up sooner.  "Dr. Beryle Beams said I needed to be seen in 4-6 weeks"  POF sent to schedulers

## 2013-07-21 NOTE — Telephone Encounter (Signed)
Message copied by Domenic Schwab on Wed Jul 21, 2013  4:13 PM ------      Message from: Annia Belt      Created: Thu Jul 15, 2013  9:49 AM       Call pt: tumor marker already trending down from 92 to 73 ------

## 2013-07-21 NOTE — Telephone Encounter (Signed)
Message copied by Domenic Schwab on Wed Jul 21, 2013  4:12 PM ------      Message from: Annia Belt      Created: Thu Jul 15, 2013  9:49 AM       Call pt: tumor marker already trending down from 92 to 73 ------

## 2013-07-23 ENCOUNTER — Telehealth: Payer: Self-pay | Admitting: Oncology

## 2013-07-23 NOTE — Telephone Encounter (Signed)
, °

## 2013-07-27 ENCOUNTER — Telehealth: Payer: Self-pay | Admitting: Hematology and Oncology

## 2013-07-27 NOTE — Telephone Encounter (Signed)
NEW CALENDAR MAILED.  °

## 2013-08-02 ENCOUNTER — Telehealth: Payer: Self-pay | Admitting: *Deleted

## 2013-08-02 NOTE — Telephone Encounter (Signed)
Received vm call this am asking for return call.  Call returned @ 9:30 am & pt reports that she received a new schedule in the mail that she was scheduled with someone else & she was supposed to see Dr [name].  She states she doesn't understand.  Checked with Melissa/Scheduler & Merry Proud Heffelfinger/Director/Med Onc & apparently Dr [name] is unable to take on new pts at this time.  Informed pt that Dr Alvy Bimler is very good & that I think she will like her & suggested she give her a try.  She is upset over this & states she feels that she has just been discarded.  Informed that she could call Merry Proud to discuss if she would like.  She reports that she has Dr Azucena Freed ph # & may call him to discuss.  Informed Merry Proud of this situation.

## 2013-08-09 ENCOUNTER — Telehealth: Payer: Self-pay | Admitting: *Deleted

## 2013-08-09 NOTE — Telephone Encounter (Signed)
Talked to pt; she prefers to talk to Dr Beryle Beams directly - I will relay message to him.

## 2013-08-09 NOTE — Telephone Encounter (Signed)
Received a call from Nevayah asking to speak with Dr Beryle Beams.   Call received at 4:15 and sent to [facility]  Pt # [phone]

## 2013-08-10 ENCOUNTER — Other Ambulatory Visit: Payer: Self-pay | Admitting: Oncology

## 2013-08-11 ENCOUNTER — Telehealth: Payer: Self-pay | Admitting: Oncology

## 2013-08-11 NOTE — Telephone Encounter (Signed)
, °

## 2013-08-12 ENCOUNTER — Other Ambulatory Visit: Payer: Self-pay | Admitting: *Deleted

## 2013-08-12 DIAGNOSIS — C7951 Secondary malignant neoplasm of bone: Principal | ICD-10-CM

## 2013-08-12 DIAGNOSIS — C50919 Malignant neoplasm of unspecified site of unspecified female breast: Secondary | ICD-10-CM

## 2013-08-17 ENCOUNTER — Other Ambulatory Visit (HOSPITAL_BASED_OUTPATIENT_CLINIC_OR_DEPARTMENT_OTHER): Payer: Medicare PPO

## 2013-08-17 ENCOUNTER — Ambulatory Visit (HOSPITAL_BASED_OUTPATIENT_CLINIC_OR_DEPARTMENT_OTHER): Payer: Medicare PPO | Admitting: Oncology

## 2013-08-17 ENCOUNTER — Other Ambulatory Visit: Payer: Self-pay | Admitting: *Deleted

## 2013-08-17 VITALS — BP 175/89 | HR 73 | Temp 98.9°F | Resp 18 | Ht 68.0 in | Wt 154.9 lb

## 2013-08-17 DIAGNOSIS — C50919 Malignant neoplasm of unspecified site of unspecified female breast: Secondary | ICD-10-CM

## 2013-08-17 DIAGNOSIS — Z170 Estrogen receptor positive status [ER+]: Secondary | ICD-10-CM

## 2013-08-17 DIAGNOSIS — C7951 Secondary malignant neoplasm of bone: Principal | ICD-10-CM

## 2013-08-17 DIAGNOSIS — C7952 Secondary malignant neoplasm of bone marrow: Secondary | ICD-10-CM

## 2013-08-17 DIAGNOSIS — C773 Secondary and unspecified malignant neoplasm of axilla and upper limb lymph nodes: Secondary | ICD-10-CM

## 2013-08-17 DIAGNOSIS — Z8672 Personal history of thrombophlebitis: Secondary | ICD-10-CM

## 2013-08-17 DIAGNOSIS — C50519 Malignant neoplasm of lower-outer quadrant of unspecified female breast: Secondary | ICD-10-CM

## 2013-08-17 LAB — CBC WITH DIFFERENTIAL/PLATELET
BASO%: 2.6 % — ABNORMAL HIGH (ref 0.0–2.0)
Basophils Absolute: 0.1 10*3/uL (ref 0.0–0.1)
EOS%: 0.8 % (ref 0.0–7.0)
Eosinophils Absolute: 0 10*3/uL (ref 0.0–0.5)
HCT: 34.5 % — ABNORMAL LOW (ref 34.8–46.6)
HGB: 11.5 g/dL — ABNORMAL LOW (ref 11.6–15.9)
LYMPH%: 16.9 % (ref 14.0–49.7)
MCH: 32.3 pg (ref 25.1–34.0)
MCHC: 33.5 g/dL (ref 31.5–36.0)
MCV: 96.6 fL (ref 79.5–101.0)
MONO#: 0.2 10*3/uL (ref 0.1–0.9)
MONO%: 11.7 % (ref 0.0–14.0)
NEUT#: 1.3 10*3/uL — ABNORMAL LOW (ref 1.5–6.5)
NEUT%: 68 % (ref 38.4–76.8)
PLATELETS: 276 10*3/uL (ref 145–400)
RBC: 3.57 10*6/uL — ABNORMAL LOW (ref 3.70–5.45)
RDW: 19.1 % — ABNORMAL HIGH (ref 11.2–14.5)
WBC: 1.9 10*3/uL — AB (ref 3.9–10.3)
lymph#: 0.3 10*3/uL — ABNORMAL LOW (ref 0.9–3.3)

## 2013-08-17 LAB — COMPREHENSIVE METABOLIC PANEL (CC13)
ALK PHOS: 69 U/L (ref 40–150)
ALT: 8 U/L (ref 0–55)
AST: 18 U/L (ref 5–34)
Albumin: 3.8 g/dL (ref 3.5–5.0)
Anion Gap: 9 mEq/L (ref 3–11)
BUN: 14.9 mg/dL (ref 7.0–26.0)
CO2: 28 mEq/L (ref 22–29)
Calcium: 9.4 mg/dL (ref 8.4–10.4)
Chloride: 107 mEq/L (ref 98–109)
Creatinine: 0.7 mg/dL (ref 0.6–1.1)
Glucose: 97 mg/dl (ref 70–140)
Potassium: 3.4 mEq/L — ABNORMAL LOW (ref 3.5–5.1)
SODIUM: 143 meq/L (ref 136–145)
TOTAL PROTEIN: 6.7 g/dL (ref 6.4–8.3)
Total Bilirubin: 0.41 mg/dL (ref 0.20–1.20)

## 2013-08-17 LAB — PROTHROMBIN TIME
INR: 1.84 — ABNORMAL HIGH (ref <1.50)
Prothrombin Time: 20.9 seconds — ABNORMAL HIGH (ref 11.6–15.2)

## 2013-08-17 MED ORDER — MORPHINE SULFATE ER 15 MG PO TBCR: 15.0000 mg | EXTENDED_RELEASE_TABLET | Freq: Two times a day (BID) | ORAL | Status: DC

## 2013-08-17 NOTE — Progress Notes (Signed)
[name]  [phone] [phone]     ID: [name] OB: [birth year]  MR#: [id number]  ZJI#:[id number]  PCP: Carolyn Labrum, MD GYN:  [name]  SU:  OTHER MD: [name], [name]  CHIEF COMPLAINT: "I hurt all the time"  INTERVAL HISTORY: [name] returns today for followup of her stage IV breast cancer. She is establishing herself in my service today. I have reviewed her history and given her a copy so she can correct that at her leisure. She is currently on letrozole andpalbociclib/ Ibrance, which she started approximately 7 weeks ago, mid-March  REVIEW OF SYSTEMS:  she is tolerating her new medications moderately well. She's not having severe hot flashes or vaginal dryness problems from the letrozole. 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 Her bones hurt all the time. She has some mouth sores. She had endometrial bleeding, possibly related to her recent treatment with Megace, possibly to her earlier treatments with tamoxifen. She is followed by [name]  for this problem and according to the patient he found endometrial hyperplasia to be the problem. She has nausea sometimes. She has pain in the lower abdomen, which is fairly constant and this is the main reason she takes Norco, up to 10 tablets a day. She does get constipated for this but lately has been having some loose bowel movements and quite a lot of gas". The patient has a history of chronic pain in her feet. She has a  history of extensive superficial phlebitis treated with chronic low-dose Coumadin anticoagulation. she denies any unusual headaches, visual changes, dizziness, gait imbalance or history of seizures. A detailed review of systems today was otherwise stable   PAST MEDICAL HISTORY: Past Medical History  Diagnosis Date  . Cancer     RBreast, Lower back , Under l Arm  . Hypertension   . GERD (gastroesophageal reflux disease)   . Breast cancer metastasized to bone 04/30/2011  . Radiation 01/15/2012    Lumbosacral spine 3500 cGy 14  fx  . Swollen R ankle 08/10/2012  . Fracture of ankle, medial malleolus, right, closed 08/10/2012  . Tubular adenoma 2011  . Hiatal hernia 2011  . Schatzki's ring   . Anorexia 03/02/2013  . Weight loss due to medication 03/02/2013    PAST SURGICAL HISTORY: Past Surgical History  Procedure Laterality Date  . Gallbladder surgery  1991  . Mastectomy  06/28/10    bilateral- Dr. Rosebud [name], [name]  . Inguinal hernia repair  90's    right  . Parathyroidectomy  90's  . Carpal tunnel release  90's  . Oophorectomy  90's    right  . Lymph node dissection    . Breast surgery    . Cholecystectomy    . Colonoscopy  01/08/10    Dr. Gala [name]- anal tag,hemorrhoid o/w normal rectum, pancolonic diverticula, diminutive polyp in the base of the cecum= tubular adenoma on bx. poor prep  . Esophagogastroduodenoscopy  01/08/10    Dr. Gala [name]- normal esophagus, small hiatal hernia, antum and body erosions, pedunculated polyp between D1 and D2, duodenal diverticulum, lymphangiectasia, second portion of the duodenum.stomach bx= inflammation, duodenum bx= adenoma  . Rotator cuff repair  08/27/11    Dr. Alphonzo [name]- [facility]- [name]  . Esophagogastroduodenoscopy (egd) with propofol N/A 11/19/2012    RMR: non-critical Schatzki's ring  s/p dilation with 14 F, multiple gastric polyps, duodenal polyp s/p piecemeal snare polypectomy, gastric polypectomy, path benign  . Maloney dilation N/A 11/19/2012    Procedure: Venia Minks DILATION;  Surgeon: Carolyn Dolin, MD;  Location: [facility];  Service: Endoscopy;  Laterality: N/A;  #[id number]  . Polypectomy N/A 11/19/2012    Procedure: POLYPECTOMY;  Surgeon: Carolyn Dolin, MD;  Location: [facility];  Service: Endoscopy;  Laterality: N/A;  duodenal  . Esophageal biopsy N/A 11/19/2012    Procedure:  BIOPSY;  Surgeon: Carolyn Dolin, MD;  Location: [facility];  Service: Endoscopy;  Laterality: N/A;  esophageal    FAMILY HISTORY Family History  Problem Relation Age of Onset  . Cancer [name]     Colon  . Cancer [name]     Lung, Mouth,leg   the patient's [name] died at the age of 65 from metastatic head and neck carcinoma. The patient's [name] died of breast cancer the age of 55. The patient had 2 brothers, both with prostate cancer. She has 4 sisters, one diagnosed with breast cancer at the age of 24, another 1 recent underwent bilateral mastectomies. Another one died from colon cancer.   GYNECOLOGIC HISTORY:   menarche age 13, she is GX P0. She underwent menopause at age 25, status post HRT for 12 years. She status post right salpingo-oophorectomy.   SOCIAL HISTORY:  She works at [employer] in [place]. Her husband died at [facility] [place] from complications of surgery after a 7-1/2 month hospitalization) 2008). She lives by herself, with no pets. She tells me she works 9 hours a day 5 days a week.     ADVANCED DIRECTIVES:    HEALTH MAINTENANCE: History  Substance Use Topics  . Smoking status: Former Smoker    Quit date: 11/22/1982  . Smokeless tobacco: Not on file  . Alcohol Use: No     Colonoscopy:  PAP:  Bone density:  Lipid panel:  Allergies  Allergen Reactions  . Aspirin   . Percodan [Oxycodone-Aspirin] Nausea Only  . Codeine Nausea Only    Current Outpatient Prescriptions  Medication Sig Dispense Refill  . cholecalciferol (VITAMIN D) 1000 UNITS tablet Take 2,000 Units by mouth daily.      . diphenoxylate-atropine (LOMOTIL) 2.5-0.025 MG per tablet Take 2 tablets by mouth as needed for diarrhea or loose stools.       . Homeopathic Products (ARNICA) GEL Apply topically every morning. Feet, legs to the knees      . HYDROcodone-acetaminophen (NORCO) 10-325 MG per tablet Take 1-2 tablets by mouth every 4 (four) hours as needed for moderate pain or severe  pain.  360 tablet  0  . IBRANCE 125 MG capsule Take 125 mg by mouth daily. Take 21days out of 28 with Letrozole, with food      . letrozole (FEMARA) 2.5 MG tablet Take 1 tablet (2.5 mg total) by mouth daily.  28 tablet  5  . lisinopril (PRINIVIL,ZESTRIL) 20 MG tablet Take 20 mg by mouth daily.       Marland Kitchen loperamide (IMODIUM A-D) 2 MG tablet Take 2 mg by mouth 4 (four) times daily as needed for diarrhea or loose stools.      . megestrol (MEGACE) 40 MG tablet       . mometasone (ELOCON) 0.1 % lotion Apply topically daily as needed. Use as directed  30 mL  5  . morphine (MS CONTIN) 15 MG 12 hr tablet Take 1 tablet (15 mg total) by mouth  every 12 (twelve) hours.  60 tablet  0  . nitrofurantoin, macrocrystal-monohydrate, (MACROBID) 100 MG capsule       . ondansetron (ZOFRAN-ODT) 8 MG disintegrating tablet Take 1 tablet (8 mg total) by mouth every 8 (eight) hours as needed for nausea.  20 tablet  1  . pantoprazole (PROTONIX) 20 MG tablet Take 20 mg by mouth every other day.      . warfarin (COUMADIN) 4 MG tablet Take 2-4 mg by mouth daily. 30m on Mon.,Wed.,Fri., and 454mall other days       No current facility-administered medications for this visit.    OBJECTIVE: Middle-aged white woman who appears stated age Fi60itals:   08/17/13 1600  BP: 175/89  Pulse: 73  Temp: 98.9 F (37.2 C)  Resp: 18     Body mass index is 23.56 kg/(m^2).    ECOG FS:1 - Symptomatic but completely ambulatory  Ocular: Sclerae unicteric, pupils equal, round and reactive to light Ear-nose-throat: Oropharynx clear and moist Lymphatic: No cervical or supraclavicular adenopathy Lungs no rales or rhonchi, good excursion bilaterally Heart regular rate and rhythm, no murmur appreciated Abd soft, nontender including the lower abdomen area, positive bowel sounds, no masses palpated MSK mild spinal tenderness about L1, no joint edema Neuro: non-focal, well-oriented, appropriate affect Breasts: Status post bilateral  mastectomies; no evidence of local recurrence; both axillae are benign.   LAB RESULTS:  CMP     Component Value Date/Time   NA 143 08/17/2013 1514   NA 143 06/15/2013 0958   K 3.4* 08/17/2013 1514   K 3.5 06/15/2013 0958   CL 108 06/15/2013 0958   CL 110* 09/02/2012 0814   CO2 28 08/17/2013 1514   CO2 26 06/15/2013 0958   GLUCOSE 97 08/17/2013 1514   GLUCOSE 87 06/15/2013 0958   GLUCOSE 105* 09/02/2012 0814   BUN 14.9 08/17/2013 1514   BUN 14 06/15/2013 0958   CREATININE 0.7 08/17/2013 1514   CREATININE 0.67 06/15/2013 0958   CALCIUM 9.4 08/17/2013 1514   CALCIUM 8.9 06/15/2013 0958   PROT 6.7 08/17/2013 1514   PROT 6.7 06/15/2013 0958   ALBUMIN 3.8 08/17/2013 1514   ALBUMIN 4.0 06/15/2013 0958   AST 18 08/17/2013 1514   AST 16 06/15/2013 0958   ALT 8 08/17/2013 1514   ALT 10 06/15/2013 0958   ALKPHOS 69 08/17/2013 1514   ALKPHOS 58 06/15/2013 0958   BILITOT 0.41 08/17/2013 1514   BILITOT 0.4 06/15/2013 0958   GFRNONAA >60 06/27/2010 1558   GFRAA  Value: >60        The eGFR has been calculated using the MDRD equation. This calculation has not been validated in all clinical situations. eGFR's persistently <60 mL/min signify possible Chronic Kidney Disease. 06/27/2010 1558    I No results found for this basename: SPEP,  UPEP,   kappa and lambda light chains    Lab Results  Component Value Date   WBC 1.9* 08/17/2013   NEUTROABS 1.3* 08/17/2013   HGB 11.5* 08/17/2013   HCT 34.5* 08/17/2013   MCV 96.6 08/17/2013   PLT 276 08/17/2013      Chemistry      Component Value Date/Time   NA 143 08/17/2013 1514   NA 143 06/15/2013 0958   K 3.4* 08/17/2013 1514   K 3.5 06/15/2013 0958   CL 108 06/15/2013 0958   CL 110* 09/02/2012 0814   CO2 28 08/17/2013 1514   CO2 26 06/15/2013 0958   BUN 14.9 08/17/2013 1514  BUN 14 06/15/2013 0958   CREATININE 0.7 08/17/2013 1514   CREATININE 0.67 06/15/2013 0958      Component Value Date/Time   CALCIUM 9.4 08/17/2013 1514   CALCIUM 8.9 06/15/2013 0958   ALKPHOS 69  08/17/2013 1514   ALKPHOS 58 06/15/2013 0958   AST 18 08/17/2013 1514   AST 16 06/15/2013 0958   ALT 8 08/17/2013 1514   ALT 10 06/15/2013 0958   BILITOT 0.41 08/17/2013 1514   BILITOT 0.4 06/15/2013 0958       Lab Results  Component Value Date   LABCA2 73* 07/14/2013    No components found with this basename: LABCA125    No results found for this basename: INR,  in the last 168 hours  Urinalysis    Component Value Date/Time   COLORURINE YELLOW 06/28/2010 [place] 06/28/2010 0856   LABSPEC 1.024 06/28/2010 0856   PHURINE 6.0 06/28/2010 0856   HGBUR NEGATIVE 06/28/2010 0856   BILIRUBINUR NEGATIVE 06/28/2010 0856   KETONESUR NEGATIVE 06/28/2010 0856   PROTEINUR NEGATIVE 06/28/2010 0856   UROBILINOGEN 0.2 06/28/2010 0856   NITRITE NEGATIVE 06/28/2010 0856   LEUKOCYTESUR NEGATIVE MICROSCOPIC NOT DONE ON URINES WITH NEGATIVE PROTEIN, BLOOD, LEUKOCYTES, NITRITE, OR GLUCOSE <1000 mg/dL. 06/28/2010 0856    STUDIES: No results found.  ASSESSMENT: 68 y.o. [name], [place] woman status post right lumpectomy and axillary lymph node dissection December 2006 for a T N2, stage IIIA invasive ductal carcinoma, estrogen and progesterone receptor positive,  (1) received doxorubicin, docetaxel and cyclophosphamide  (2) status post adjuvant radiation  (3) on tamoxifen to September 2011 when she was noted to have metastatic disease to bone  (4) fulvestrant  (5) left axillary lymph node positive for an invasive ductal carcinoma, estrogen receptor and HER-2 negative November 2011, and a right breast recurrence for an estrogen receptor positive lesion at the same time.  (6) status post bilateral mastectomies 06/28/2010  (7) status post left chest and axillary radiation  (8) status post right chest wall recurrence October of 2012, treated with electron beam radiation completed December 2012  (9) progression in bone August 2013,  (10) status post radiation to the lumbosacral spine completed October 2013     (11) capecitabine November 2013 through January 2014  (12) tamoxifen/ everolimus February 2014, discontinued November 2014 with progression  (13) Megace 80 mg twice daily with progression January 2015  (14) Radiation to lumbar spine February 2015  (15) letrozole plus Ibrance 07/08/2013  (16). Declines genetic testing (as of 08/17/2013)    PLAN: I spent well over an hour getting acquainted with [name] and discussing her situation in detail. She understands that her tumor, being stage IV, is not curable. She did have treatment with curative intent initially, which was the chemotherapy agents she had first. Once the tumor came back mostly she has had anti-estrogens, although there was a brief period that she received capecitabine. She is essentially not had any chemotherapy other than that in the metastatic setting. She has had fulvestrant, tamoxifen and everolimus and now for marrow with Ibrance.  I think her current treatment is very reasonable. I am going to obtain a PET scan which will also serve as a new baseline study since her last PET scan was May of last year. We are going to check her labwork on an every two-week basis including her INR. She knows to call for any fever, rash, bleeding, or other symptoms that may be related to her medications. We will discuss adding  zolendronate at her  next visit here  I started her on MS Contin 15 mg to take twice daily. She understands she can continue to take the Norco as needed. We discussed bowel prophylaxis and she will make sure to have a soft bowel movement at least every 3 days or call us.  I would like to at some point stop her Coumadin, which is not for DVT or PE treatment, and substitute compression stockings if she can tolerate them. This might allow her to take naproxen for pain and might reduce her need for narcotics.  She has a strong family history for breast cancer and would warrant genetic testing. She says this was not approved  in the past. That may have been a long time ago. However at this point she "doesn't see any point it". She will continue to think about it and let me know if she changes her mind.  She is going to see me again in 2 months. She prefers morning appointments. She will call for results of her PET scan. She knows to call for any problems that may develop before her next visit here.    Chauncey Cruel, MD   08/17/2013 6:15 PM

## 2013-08-19 ENCOUNTER — Encounter: Payer: Self-pay | Admitting: Pharmacist

## 2013-08-19 NOTE — Progress Notes (Signed)
Ms Hornbrook is signed up for coumadin clinic at [facility].  She lives in [place] and gets her INR checked at her PCP office, Judd Lien, every 2 weeks.  I have spoken to Dr Pleas Koch office and they will fax PT/INR results to [place].  Next INR in 2 weeks.

## 2013-08-20 ENCOUNTER — Telehealth: Payer: Self-pay | Admitting: Oncology

## 2013-08-20 NOTE — Telephone Encounter (Signed)
lvm for pt for June appt...amiled pt appt sched/avs and letter

## 2013-08-23 ENCOUNTER — Telehealth: Payer: Self-pay | Admitting: *Deleted

## 2013-08-23 ENCOUNTER — Other Ambulatory Visit: Payer: Self-pay | Admitting: Oncology

## 2013-08-23 DIAGNOSIS — C50919 Malignant neoplasm of unspecified site of unspecified female breast: Secondary | ICD-10-CM

## 2013-08-23 NOTE — Telephone Encounter (Signed)
Protonix refill request received via [employer].  Called pharmacy for clarification of this request with medication list sig.  Learned protonix has been filled by Dr. Lavone Nian since 2014.  Switched from 40 mg tabs to 20 mg tabs due to unavailability.  Noted "dose change" with the F/U visit on 06-15-2013 to protonix 20mg  QOD.  Called patient.  Awaiting return call from patient to clarify how she takes protonix.

## 2013-08-24 ENCOUNTER — Ambulatory Visit (HOSPITAL_COMMUNITY)
Admission: RE | Admit: 2013-08-24 | Discharge: 2013-08-24 | Disposition: A | Discharge status: Home / Self Care | Payer: Medicare PPO | Source: Ambulatory Visit | Attending: Oncology | Admitting: Oncology

## 2013-08-24 ENCOUNTER — Other Ambulatory Visit: Payer: Self-pay | Admitting: *Deleted

## 2013-08-24 DIAGNOSIS — C50219 Malignant neoplasm of upper-inner quadrant of unspecified female breast: Secondary | ICD-10-CM | POA: Insufficient documentation

## 2013-08-24 DIAGNOSIS — C50919 Malignant neoplasm of unspecified site of unspecified female breast: Secondary | ICD-10-CM

## 2013-08-24 DIAGNOSIS — C7952 Secondary malignant neoplasm of bone marrow: Secondary | ICD-10-CM

## 2013-08-24 DIAGNOSIS — C7951 Secondary malignant neoplasm of bone: Secondary | ICD-10-CM | POA: Insufficient documentation

## 2013-08-24 LAB — GLUCOSE, CAPILLARY: GLUCOSE-CAPILLARY: 89 mg/dL (ref 70–99)

## 2013-08-24 MED ORDER — PANTOPRAZOLE SODIUM 20 MG PO TBEC: 20.0000 mg | DELAYED_RELEASE_TABLET | ORAL | Status: DC

## 2013-08-24 MED ORDER — FLUDEOXYGLUCOSE F - 18 (FDG) INJECTION
8.8000 | Freq: Once | INTRAVENOUS | Status: AC | PRN
  Administered 2013-08-24: 8.8 via INTRAVENOUS

## 2013-08-24 NOTE — Telephone Encounter (Signed)
[name] returned call.  Expressed to her the difference in sig.  "Dr. Beryle Beams changed me to the 20 mg every other day because the 40 mg was making me sick.  I actually take one every three days.  Noted refill has been approved by provider.  Will add this information to the medication list.

## 2013-08-24 NOTE — Addendum Note (Signed)
Addended by: Cherylynn Ridges on: 08/24/2013 10:39 AM   Modules accepted: Orders

## 2013-08-31 LAB — POCT INR: INR: 2.3

## 2013-09-01 ENCOUNTER — Ambulatory Visit: Payer: Medicare PPO | Admitting: Pharmacist

## 2013-09-01 DIAGNOSIS — C50919 Malignant neoplasm of unspecified site of unspecified female breast: Secondary | ICD-10-CM

## 2013-09-01 DIAGNOSIS — Z8672 Personal history of thrombophlebitis: Secondary | ICD-10-CM

## 2013-09-01 NOTE — Patient Instructions (Signed)
Continue taking 4mg  daily except 2mg  on MWF. Recheck INR in 1 month per Dr. Annie Main Burdine's Office at [facility] in [place], [place].

## 2013-09-01 NOTE — Progress Notes (Signed)
INR within goal from 08/31/13. INR = 2.3 Hg/Hct: 10.9/31.6, Pltc = 160 Left message for pt to call us back for INR results. Also to let us know if any of her medications have changed or if she is having any problems with her coumadin. Continue taking 4mg  daily except 2mg  on MWF. Recheck INR in 1 month per Dr. Annie Main Burdine's Office at [facility] in [place], [place]. Phone: [phone], Fax: [phone]  No charge telephone encounter.

## 2013-09-07 ENCOUNTER — Other Ambulatory Visit: Payer: Medicare PPO

## 2013-09-07 ENCOUNTER — Ambulatory Visit: Payer: Medicare PPO | Admitting: Hematology and Oncology

## 2013-09-14 ENCOUNTER — Telehealth: Payer: Self-pay | Admitting: Pharmacist

## 2013-09-14 NOTE — Telephone Encounter (Signed)
Did not receive INR results today from patient's PCP office. Called patient and left VM to see if patient did go to her lab appointment today. Instructed pt to call back and let us know. I also called Dr. Annie Main Burdine's nurse at Phone: [phone] but could not reach her and left another VM asking her to call back or fax results or let us know if patient rescheduled lab appointment.   Thank you,  Montel Clock, PharmD

## 2013-09-15 ENCOUNTER — Telehealth: Payer: Self-pay | Admitting: Pharmacist

## 2013-09-15 NOTE — Telephone Encounter (Signed)
Patient called this AM. She did not have INR drawn yesterday at PCP office. INR not due until first week in June. Pt scheduled to see Dr. [name] on Friday, June 12th. Will set up coumadin clinic visit for this date.  Thank you,  Montel Clock, PharmD

## 2013-09-24 ENCOUNTER — Telehealth: Payer: Self-pay

## 2013-09-24 NOTE — Telephone Encounter (Signed)
Error

## 2013-09-28 ENCOUNTER — Telehealth: Payer: Self-pay | Admitting: *Deleted

## 2013-09-28 NOTE — Telephone Encounter (Signed)
Message left per pt - stated she's ok w/ new doctor but she wants to talk you also. Thanks

## 2013-10-08 ENCOUNTER — Other Ambulatory Visit: Payer: Self-pay | Admitting: Oncology

## 2013-10-08 ENCOUNTER — Ambulatory Visit (HOSPITAL_BASED_OUTPATIENT_CLINIC_OR_DEPARTMENT_OTHER): Payer: Medicare PPO | Admitting: Oncology

## 2013-10-08 ENCOUNTER — Ambulatory Visit: Payer: Medicare PPO | Admitting: Pharmacist

## 2013-10-08 ENCOUNTER — Telehealth: Payer: Self-pay | Admitting: Oncology

## 2013-10-08 ENCOUNTER — Other Ambulatory Visit (HOSPITAL_BASED_OUTPATIENT_CLINIC_OR_DEPARTMENT_OTHER): Payer: Medicare PPO

## 2013-10-08 VITALS — BP 171/81 | HR 64 | Temp 98.6°F | Resp 18 | Ht 68.0 in | Wt 152.3 lb

## 2013-10-08 DIAGNOSIS — C7952 Secondary malignant neoplasm of bone marrow: Secondary | ICD-10-CM

## 2013-10-08 DIAGNOSIS — I82409 Acute embolism and thrombosis of unspecified deep veins of unspecified lower extremity: Secondary | ICD-10-CM

## 2013-10-08 DIAGNOSIS — C773 Secondary and unspecified malignant neoplasm of axilla and upper limb lymph nodes: Secondary | ICD-10-CM

## 2013-10-08 DIAGNOSIS — C50419 Malignant neoplasm of upper-outer quadrant of unspecified female breast: Secondary | ICD-10-CM

## 2013-10-08 DIAGNOSIS — Z8672 Personal history of thrombophlebitis: Secondary | ICD-10-CM

## 2013-10-08 DIAGNOSIS — M79609 Pain in unspecified limb: Secondary | ICD-10-CM

## 2013-10-08 DIAGNOSIS — M1990 Unspecified osteoarthritis, unspecified site: Secondary | ICD-10-CM

## 2013-10-08 DIAGNOSIS — C50519 Malignant neoplasm of lower-outer quadrant of unspecified female breast: Secondary | ICD-10-CM

## 2013-10-08 DIAGNOSIS — G8929 Other chronic pain: Secondary | ICD-10-CM

## 2013-10-08 DIAGNOSIS — C50919 Malignant neoplasm of unspecified site of unspecified female breast: Secondary | ICD-10-CM

## 2013-10-08 DIAGNOSIS — C7951 Secondary malignant neoplasm of bone: Secondary | ICD-10-CM

## 2013-10-08 DIAGNOSIS — S8251XA Displaced fracture of medial malleolus of right tibia, initial encounter for closed fracture: Secondary | ICD-10-CM

## 2013-10-08 LAB — CBC WITH DIFFERENTIAL/PLATELET
BASO%: 3.7 % — AB (ref 0.0–2.0)
Basophils Absolute: 0.1 10*3/uL (ref 0.0–0.1)
EOS%: 0.5 % (ref 0.0–7.0)
Eosinophils Absolute: 0 10*3/uL (ref 0.0–0.5)
HEMATOCRIT: 38.9 % (ref 34.8–46.6)
HGB: 13.2 g/dL (ref 11.6–15.9)
LYMPH%: 11.3 % — AB (ref 14.0–49.7)
MCH: 34.4 pg — ABNORMAL HIGH (ref 25.1–34.0)
MCHC: 33.8 g/dL (ref 31.5–36.0)
MCV: 101.6 fL — AB (ref 79.5–101.0)
MONO#: 0.3 10*3/uL (ref 0.1–0.9)
MONO%: 10.2 % (ref 0.0–14.0)
NEUT#: 1.9 10*3/uL (ref 1.5–6.5)
NEUT%: 74.3 % (ref 38.4–76.8)
PLATELETS: 273 10*3/uL (ref 145–400)
RBC: 3.83 10*6/uL (ref 3.70–5.45)
RDW: 17.7 % — ABNORMAL HIGH (ref 11.2–14.5)
WBC: 2.5 10*3/uL — ABNORMAL LOW (ref 3.9–10.3)
lymph#: 0.3 10*3/uL — ABNORMAL LOW (ref 0.9–3.3)

## 2013-10-08 LAB — COMPREHENSIVE METABOLIC PANEL (CC13)
ALBUMIN: 3.7 g/dL (ref 3.5–5.0)
ALK PHOS: 87 U/L (ref 40–150)
ALT: 13 U/L (ref 0–55)
AST: 18 U/L (ref 5–34)
Anion Gap: 7 mEq/L (ref 3–11)
BUN: 13.4 mg/dL (ref 7.0–26.0)
CALCIUM: 9.3 mg/dL (ref 8.4–10.4)
CHLORIDE: 106 meq/L (ref 98–109)
CO2: 28 mEq/L (ref 22–29)
Creatinine: 0.7 mg/dL (ref 0.6–1.1)
Glucose: 102 mg/dl (ref 70–140)
POTASSIUM: 4.1 meq/L (ref 3.5–5.1)
Sodium: 142 mEq/L (ref 136–145)
Total Bilirubin: 0.54 mg/dL (ref 0.20–1.20)
Total Protein: 6.7 g/dL (ref 6.4–8.3)

## 2013-10-08 LAB — PROTIME-INR
INR: 1.9 — ABNORMAL LOW (ref 2.00–3.50)
PROTIME: 22.8 s — AB (ref 10.6–13.4)

## 2013-10-08 LAB — CANCER ANTIGEN 27.29: CA 27.29: 51 U/mL — ABNORMAL HIGH (ref 0–39)

## 2013-10-08 LAB — POCT INR: INR: 1.9

## 2013-10-08 MED ORDER — DENOSUMAB 60 MG/ML ~~LOC~~ SOLN: 60.0000 mg | Freq: Once | SUBCUTANEOUS | Status: DC

## 2013-10-08 MED ORDER — DENOSUMAB 120 MG/1.7ML ~~LOC~~ SOLN
120.0000 mg | Freq: Once | SUBCUTANEOUS | Status: AC
  Administered 2013-10-08: 120 mg via SUBCUTANEOUS
  Filled 2013-10-08: qty 1.7

## 2013-10-08 NOTE — Patient Instructions (Signed)
INR at goal No changes Continue taking 4mg  daily except 2mg  on MWF. Recheck INR in 1 month per Dr. Annie Main Burdine's Office at [facility] in [place], [place] on 10/21/13.

## 2013-10-08 NOTE — Progress Notes (Signed)
[place]  [phone] Fax:([phone]) [phone]     ID: [name] OB: 08/13/1945  MR#: [id number]  QXI#:[id number]  PCP: Carolyn Labrum, MD GYN:  [name]  SU:  OTHER MD: [name], [name]  CHIEF COMPLAINT: stage IV breast cancer CURRENT TREATMENT: letrozole + palbocilcib [name])  BREAST CANCER HISTORY: From [name] summary 07/14/2013::  "Complex 68 year old woman with history of ER-positive and ER negative metachronous primary bilateral breast cancers.  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 Progression in bone while on tamoxifen September 2011. Initial complete response to monthly Faslodex injections.She developed a malignant left axillary lymph node with no clear primary in the left breast November 2011. This tumor was ER negative. HER-2 negative. While under evaluation for this lesion she developed a new lesion in the right breast which was ER positive. She underwent bilateral mastectomies 06/28/2010. She was continued on hormonal therapy with monthly Faslodex injections in view of the ER positive component of her tumor with a complete response to this hormone in bone. She underwent radiation to the left chest and axilla subsequent to the mastectomies. She developed a cutaneous recurrence on the right chest wall along the medial aspect of the right mastectomy scar, biopsy proven, initially detected on clinical exam by her radiation oncologist on 02/14/2011. She was treated with electron beam radiation.. 3000 cGy in 15 fractions through December 2012. Further progression in bone in August 2013. Palliative radiation to lumbosacral spine September through October 2013. Brief trial of Xeloda November 2013 through January 2014. Change to  tamoxifen plus [employer] February 2014 stopped for progression and 25 pound weight loss November 2014. Trial of Megace 80 mg twice a day with improved appetite but progressive rise in tumor marker and progressive low back pain with further progression on MRI of the spine done January 2015. Radiation to the lumbar spine February 2015. She started most recent salvage regimen with a combination of Ibrance plus Femara on 07/08/2013. It is too early to assess response."  The patient's breast cancer history is summarized in detailed below   INTERVAL HISTORY: [name] returns today for followup of her stage IV breast cancer accompanied by her friend [name], [name]. [name] continues on letrozole and Palbociclib. She tolerates the letrozole well. She tells me that Palbociclib gives her a lot of gas. She can tell because she has less gas on her "off" week. It also upsets her stomach.  REVIEW OF SYSTEMS: She does have a history of GI problems, but it could be that the [name] is making those a little bit worse. She also has diarrhea and nausea issues. These are intermittent. There has been no bleeding or fever. She does not complain of hair loss. She continues to have severe pain in her left shoulder in her feet and legs. She hurts "all over" though. She complains of chills, insomnia, fatigue, changes in her vision, with blurred vision intermittently, runny nose, tinnitus, mouth sores, irregular heartbeat, palpitations, or feet swelling, shortness of breath, poor appetite, abdominal cramps, and dribbling urine, itchy skin, back and joint pain, numbness, weakness, forgetfulness, and anxiety. A detailed review of systems was otherwise stable  PAST MEDICAL HISTORY: Past Medical History  Diagnosis Date  . Cancer     RBreast, Lower back , Under l Arm  . Hypertension   . GERD (gastroesophageal reflux disease)   .  Breast cancer metastasized to bone 04/30/2011  . Radiation 01/15/2012    Lumbosacral spine 3500 cGy 14  fx  .  Swollen R ankle 08/10/2012  . Fracture of ankle, medial malleolus, right, closed 08/10/2012  . Tubular adenoma 2011  . Hiatal hernia 2011  . Schatzki's ring   . Anorexia 03/02/2013  . Weight loss due to medication 03/02/2013    PAST SURGICAL HISTORY: Past Surgical History  Procedure Laterality Date  . Gallbladder surgery  1991  . Mastectomy  06/28/10    bilateral- [name], [place]  . Inguinal hernia repair  90's    right  . Parathyroidectomy  90's  . Carpal tunnel release  90's  . Oophorectomy  90's    right  . Lymph node dissection    . Breast surgery    . Cholecystectomy    . Colonoscopy  01/08/10    [name]- anal tag,hemorrhoid o/w normal rectum, pancolonic diverticula, diminutive polyp in the base of the cecum= tubular adenoma on bx. poor prep  . Esophagogastroduodenoscopy  01/08/10    [name]- normal esophagus, small hiatal hernia, antum and body erosions, pedunculated polyp between D1 and D2, duodenal diverticulum, lymphangiectasia, second portion of the duodenum.stomach bx= inflammation, duodenum bx= adenoma  . Rotator cuff repair  08/27/11    [name]- [facility]- [name]  . Esophagogastroduodenoscopy (egd) with propofol N/A 11/19/2012    RMR: non-critical Schatzki's ring s/p dilation with 56 F, multiple gastric polyps, duodenal polyp s/p piecemeal snare polypectomy, gastric polypectomy, path benign  . Maloney dilation N/A 11/19/2012    Procedure: [name] DILATION;  Surgeon: Carolyn Dolin, MD;  Location: [facility];  Service: Endoscopy;  Laterality: N/A;  #[id number]  . Polypectomy N/A 11/19/2012    Procedure: POLYPECTOMY;  Surgeon: Carolyn Dolin, MD;  Location: [facility];  Service: Endoscopy;  Laterality: N/A;  duodenal  . Esophageal biopsy N/A 11/19/2012    Procedure: BIOPSY;  Surgeon: Carolyn Dolin, MD;  Location: [facility];  Service: Endoscopy;  Laterality: N/A;  esophageal    FAMILY HISTORY Family History  Problem Relation Age of Onset  . Cancer Mother     Colon  . Cancer  Father     Lung, Mouth,leg   the patient's father died at the age of 62 from metastatic head and neck carcinoma. The patient's mother died of breast cancer the age of 9. The patient had 2 brothers, both with prostate cancer. She has 4 sisters, one diagnosed with breast cancer at the age of 2, another 1 recent underwent bilateral mastectomies. Another one died from colon cancer.   GYNECOLOGIC HISTORY:   menarche age 25, she is GX P0. She underwent menopause at age 15, status post HRT for 12 years. She status post right salpingo-oophorectomy.   SOCIAL HISTORY:  She works at [employer] in [place]. Her husband died at [facility] [place] from complications of surgery after a 7-1/2 month hospitalization) 2008). She lives by herself, with no pets. She tells me she works 9 hours a day 5 days a week.     ADVANCED DIRECTIVES:    HEALTH MAINTENANCE: History  Substance Use Topics  . Smoking status: Former Smoker    Quit date: 11/22/1982  . Smokeless tobacco: Not on file  . Alcohol Use: No     Colonoscopy:  PAP:  Bone density:  Lipid panel:  Allergies  Allergen Reactions  . Aspirin   . Percodan [Oxycodone-Aspirin] Nausea Only  . Codeine Nausea Only  Current Outpatient Prescriptions  Medication Sig Dispense Refill  . cholecalciferol (VITAMIN D) 1000 UNITS tablet Take 2,000 Units by mouth daily.      . diphenoxylate-atropine (LOMOTIL) 2.5-0.025 MG per tablet Take 2 tablets by mouth as needed for diarrhea or loose stools.       . Homeopathic Products (ARNICA) GEL Apply topically every morning. Feet, legs to the knees      . HYDROcodone-acetaminophen (NORCO) 10-325 MG per tablet Take 1-2 tablets by mouth every 4 (four) hours as needed for moderate pain or severe pain.  360 tablet  0  . IBRANCE 125 MG capsule Take 125 mg by mouth daily. Take 21days out of 28 with Letrozole, with food      . letrozole (FEMARA) 2.5 MG tablet Take 1 tablet (2.5 mg total) by mouth daily.  28  tablet  5  . lisinopril (PRINIVIL,ZESTRIL) 20 MG tablet Take 20 mg by mouth daily.       Marland Kitchen loperamide (IMODIUM A-D) 2 MG tablet Take 2 mg by mouth 4 (four) times daily as needed for diarrhea or loose stools.      . megestrol (MEGACE) 40 MG tablet       . mometasone (ELOCON) 0.1 % lotion Apply topically daily as needed. Use as directed  30 mL  5  . morphine (MS CONTIN) 15 MG 12 hr tablet Take 1 tablet (15 mg total) by mouth every 12 (twelve) hours.  60 tablet  0  . nitrofurantoin, macrocrystal-monohydrate, (MACROBID) 100 MG capsule       . ondansetron (ZOFRAN-ODT) 8 MG disintegrating tablet Take 1 tablet (8 mg total) by mouth every 8 (eight) hours as needed for nausea.  20 tablet  1  . pantoprazole (PROTONIX) 20 MG tablet Take 1 tablet (20 mg total) by mouth every other day. or as needed  120 tablet  0  . warfarin (COUMADIN) 4 MG tablet Take 2-4 mg by mouth daily. 79m on Mon.,Wed.,Fri., and 458mall other days       Current Facility-Administered Medications  Medication Dose Route Frequency Provider Last Rate Last Dose  . denosumab (PROLIA) injection 60 mg  60 mg Subcutaneous Once GuChauncey CruelMD        OBJECTIVE: Middle-aged white woman who appears surprisingly well given her complaints and clinical stage Filed Vitals:   10/08/13 1005  BP: 171/81  Pulse: 64  Temp: 98.6 F (37 C)  Resp: 18     Body mass index is 23.16 kg/(m^2).    ECOG FS:1 - Symptomatic but completely ambulatory  Ocular: Sclerae unicteric, pupils equal, round and reactive to light Ear-nose-throat: Oropharynx clear and moist Lymphatic: No cervical or supraclavicular adenopathy Lungs no rales or rhonchi Heart regular rate and rhythm Abd soft, nontender, positive bowel sounds, no masses palpated MSK mild tenderness midthoracic spine, no joint edema Neuro: non-focal, well-oriented, grumpy affect Breasts: Status post bilateral mastectomies; no evidence of local recurrence; both axillae are benign.   LAB  RESULTS:  CMP     Component Value Date/Time   NA 142 10/08/2013 0937   NA 143 06/15/2013 0958   K 4.1 10/08/2013 0937   K 3.5 06/15/2013 0958   CL 108 06/15/2013 0958   CL 110* 09/02/2012 0814   CO2 28 10/08/2013 0937   CO2 26 06/15/2013 0958   GLUCOSE 102 10/08/2013 0937   GLUCOSE 87 06/15/2013 0958   GLUCOSE 105* 09/02/2012 0814   BUN 13.4 10/08/2013 0937   BUN 14 06/15/2013 099562  CREATININE 0.7 10/08/2013 0937   CREATININE 0.67 06/15/2013 0958   CALCIUM 9.3 10/08/2013 0937   CALCIUM 8.9 06/15/2013 0958   PROT 6.7 10/08/2013 0937   PROT 6.7 06/15/2013 0958   ALBUMIN 3.7 10/08/2013 0937   ALBUMIN 4.0 06/15/2013 0958   AST 18 10/08/2013 0937   AST 16 06/15/2013 0958   ALT 13 10/08/2013 0937   ALT 10 06/15/2013 0958   ALKPHOS 87 10/08/2013 0937   ALKPHOS 58 06/15/2013 0958   BILITOT 0.54 10/08/2013 0937   BILITOT 0.4 06/15/2013 0958   GFRNONAA >60 06/27/2010 1558   GFRAA  Value: >60        The eGFR has been calculated using the MDRD equation. This calculation has not been validated in all clinical situations. eGFR's persistently <60 mL/min signify possible Chronic Kidney Disease. 06/27/2010 1558    I No results found for this basename: SPEP,  UPEP,   kappa and lambda light chains    Lab Results  Component Value Date   WBC 2.5* 10/08/2013   NEUTROABS 1.9 10/08/2013   HGB 13.2 10/08/2013   HCT 38.9 10/08/2013   MCV 101.6* 10/08/2013   PLT 273 10/08/2013      Chemistry      Component Value Date/Time   NA 142 10/08/2013 0937   NA 143 06/15/2013 0958   K 4.1 10/08/2013 0937   K 3.5 06/15/2013 0958   CL 108 06/15/2013 0958   CL 110* 09/02/2012 0814   CO2 28 10/08/2013 0937   CO2 26 06/15/2013 0958   BUN 13.4 10/08/2013 0937   BUN 14 06/15/2013 0958   CREATININE 0.7 10/08/2013 0937   CREATININE 0.67 06/15/2013 0958      Component Value Date/Time   CALCIUM 9.3 10/08/2013 0937   CALCIUM 8.9 06/15/2013 0958   ALKPHOS 87 10/08/2013 0937   ALKPHOS 58 06/15/2013 0958   AST 18 10/08/2013 0937   AST 16 06/15/2013  0958   ALT 13 10/08/2013 0937   ALT 10 06/15/2013 0958   BILITOT 0.54 10/08/2013 0937   BILITOT 0.4 06/15/2013 0958       Lab Results  Component Value Date   LABCA2 73* 07/14/2013    No components found with this basename: LABCA125     Recent Labs Lab 10/08/13 0937  INR 1.90*    Urinalysis    Component Value Date/Time   COLORURINE YELLOW 06/28/2010 0856   APPEARANCEUR CLEAR 06/28/2010 0856   LABSPEC 1.024 06/28/2010 0856   PHURINE 6.0 06/28/2010 0856   HGBUR NEGATIVE 06/28/2010 0856   BILIRUBINUR NEGATIVE 06/28/2010 0856   KETONESUR NEGATIVE 06/28/2010 0856   PROTEINUR NEGATIVE 06/28/2010 0856   UROBILINOGEN 0.2 06/28/2010 0856   NITRITE NEGATIVE 06/28/2010 0856   LEUKOCYTESUR NEGATIVE MICROSCOPIC NOT DONE ON URINES WITH NEGATIVE PROTEIN, BLOOD, LEUKOCYTES, NITRITE, OR GLUCOSE <1000 mg/dL. 06/28/2010 0856    STUDIES: EXAM:  NUCLEAR MEDICINE PET SKULL BASE TO THIGH  TECHNIQUE:  8.8 MCi F-18 FDG was injected intravenously. Full-ring PET imaging  was performed from the skull base to thigh after the radiotracer. CT  data was obtained and used for attenuation correction and anatomic  localization.  FASTING BLOOD GLUCOSE: Value: 89 mg/dl  COMPARISON: Spine MRIs of 05/14/2013. Prior PET of 09/02/2012.  FINDINGS:  NECK  No areas of abnormal hypermetabolism.  CHEST  Hypermetabolic adenopathy within the left internal mammary and  prevascular station, new. Most well-defined node measures 1.5 cm and  a S.U.V. max of 2.5 on image 58.  ABDOMEN/PELVIS  No areas of abnormal hypermetabolism.  SKELETON  No abnormal marrow activity. No hypermetabolism to correspond to the  widespread osseous metastasis.  CT IMAGES PERFORMED FOR ATTENUATION CORRECTION  No significant findings within the neck. Surgical changes of  right-sided thyroidectomy. Bilateral axillary node dissection and  mastectomy. Mild cardiomegaly. Biapical and anterior right upper  lobe radiation fibrosis. Tiny hemorrhagic cyst suspect  in the upper  pole left kidney. Cholecystectomy. Similar mild common duct  dilatation, 1.4 cm in the porta hepatis. Favored to be within normal  variation, given chronicity. A descending duodenal diverticulum.  Tiny well-circumscribed liver lesions which are likely cysts.  Suspect pelvic floor laxity.  Widespread osseous metastasis. Lytic lesion in the right iliac wing  measures 1.7 cm on image 137 and is new since 09/02/2012.  IMPRESSION:  1. Interval development of hypermetabolic left internal mammary and  prevascular nodal metastasis.  2. Lack of significant hypermetabolism at the site of osseous  metastasis. This appears progressive since 09/02/2012, and was felt  to be progressive on the 05/14/2013 spine MRI.  Electronically Signed  By: Abigail Miyamoto M.D.  On: 08/24/2013 16:48    ASSESSMENT: 68 y.o. [place], [place] woman status post right lumpectomy and axillary lymph node dissection December 2006 for a T N2, stage IIIA invasive ductal carcinoma, estrogen and progesterone receptor positive, with subsequent stage IV disease  (1) received adjuvant doxorubicin, docetaxel and cyclophosphamide  (2) status post adjuvant radiation  (3) on tamoxifen to September 2011 when she was noted to have metastatic disease to bone  (4) fulvestrant 02/06/2009 to 01/15/2011  (5) left axillary lymph node positive for an invasive ductal carcinoma, estrogen receptor and HER-2 negative November 2011, and a right breast recurrence for an estrogen receptor positive lesion at the same time.  (6) status post bilateral mastectomies 06/28/2010  (7) status post left chest and axillary radiation  (8) status post right chest wall recurrence October of 2012, treated with electron beam radiation completed December 2012  (9) progression in bone August 2013,  (10) status post radiation to the lumbosacral spine completed October 2013   (11) capecitabine November 2013 through January 2014  (12) tamoxifen/ everolimus  February 2014, discontinued November 2014 with progression  (13) Megace 80 mg twice daily with progression January 2015  (14) Radiation to lumbar spine February 2015  (15) letrozole plus Ibrance started 07/08/2013  (16). Declines genetic testing (as of 08/17/2013)  (17) zolendronate 05/01/2009, discontinued because of pain; Xgeva started 10/08/2013, to be repeated every 3 months  OTHER ACTIVE PROBLEMS:  (1) chronic GI complaints, followed by Dr. Sydell Axon  (2) multiple orthopedic problems, including bilateral chronic foot pain, status post rotator cuff repair  (3) history of extensive superficial phlebitis, on chronic low dose Coumadin.  PLAN: I spent a little over an hour with [name] and her friend today going over [place] situation. She is getting her labwork done every 2 weeks but I have not been getting those results. I emphasized that it is very important for her counts to be monitored because the Palbociclib can cause severe neutropenia as well as other cytopenias. I rewrote the order so the labs will be faxed to me every 2 weeks as before.  She would like to check the Coumadin INR once a month only. I think that is appropriate. We also discussed going off Coumadin so she could take Motrin or Aleve and therefore use fewer narcotics. She absolutely does not want to stop the Coumadin because she tells me her clots, while superficial,  were progressive. She also says she was not able to tolerate nonsteroidals.  She tells me that he [name] is causing a lot of gas. We are going to try Beano for that. I suggested she take her Protonix regularly but she does not think that would make any difference.  We discussed the fact that she should be either on a bisphosphonate or denosumab. She tells me she took zolendronate 1 summit "nearly killed her". We are going to try pearly at the day after much discussion. I have asked her to take some TUMS over the next few days to make sure calcium does not go  down too much. If she tolerates it we will give her a shot at the next visit as well.  I had written her a prescription for long acting morphine to see if she could do a little bit better on her pain control, but she refuses to because she does not want to become a "morphine head". She never actually tried it. We also discussed patches but the "patches before didn't work". I rewrote her a hydrocodone/APAP prescription for an additional 120 tablets. She is going to see me again on August 17. We will repeat a PET scan that same day. If we see evidence of response or at least control, we will continue on the current regimen. Otherwise we will consider chemotherapy.  The patient has a good understanding of the overall plan. She agrees with it. She knows the goal of treatment in her case is control. She will call with any problems that may develop before her next visit here.     Chauncey Cruel, MD   10/08/2013 10:54 AM

## 2013-10-08 NOTE — Progress Notes (Signed)
INR at goal Patient is doing well today with no complaints Pt seen today by Dr. [name] and may receive Prolia today if approved by insurance INR has been very stable and she normally has this checked at Dr. Lizbeth Bark office in [place] No unusual bleeding or bruising No diet or medication changes No missed or extra doses Plan: No changes Continue taking 4mg  daily except 2mg  on MWF. Recheck INR in 1 month per Dr. Annie Main Burdine's Office at [facility] in [place], [place] on 10/21/13.

## 2013-10-08 NOTE — Patient Instructions (Signed)
Denosumab injection  What is this medicine?  DENOSUMAB (den oh sue mab) slows bone breakdown. Prolia is used to treat osteoporosis in women after menopause and in men. Xgeva is used to prevent bone fractures and other bone problems caused by cancer bone metastases. Xgeva is also used to treat giant cell tumor of the bone.  This medicine may be used for other purposes; ask your health care provider or pharmacist if you have questions.  COMMON BRAND NAME(S): Prolia, XGEVA  What should I tell my health care provider before I take this medicine?  They need to know if you have any of these conditions:  -dental disease  -eczema  -infection or history of infections  -kidney disease or on dialysis  -low blood calcium or vitamin D  -malabsorption syndrome  -scheduled to have surgery or tooth extraction  -taking medicine that contains denosumab  -thyroid or parathyroid disease  -an unusual reaction to denosumab, other medicines, foods, dyes, or preservatives  -pregnant or trying to get pregnant  -breast-feeding  How should I use this medicine?  This medicine is for injection under the skin. It is given by a health care professional in a hospital or clinic setting.  If you are getting Prolia, a special MedGuide will be given to you by the pharmacist with each prescription and refill. Be sure to read this information carefully each time.  For Prolia, talk to your pediatrician regarding the use of this medicine in children. Special care may be needed. For Xgeva, talk to your pediatrician regarding the use of this medicine in children. While this drug may be prescribed for children as young as 13 years for selected conditions, precautions do apply.  Overdosage: If you think you've taken too much of this medicine contact a poison control center or emergency room at once.  Overdosage: If you think you have taken too much of this medicine contact a poison control center or emergency room at once.  NOTE: This medicine is only for  you. Do not share this medicine with others.  What if I miss a dose?  It is important not to miss your dose. Call your doctor or health care professional if you are unable to keep an appointment.  What may interact with this medicine?  Do not take this medicine with any of the following medications:  -other medicines containing denosumab  This medicine may also interact with the following medications:  -medicines that suppress the immune system  -medicines that treat cancer  -steroid medicines like prednisone or cortisone  This list may not describe all possible interactions. Give your health care provider a list of all the medicines, herbs, non-prescription drugs, or dietary supplements you use. Also tell them if you smoke, drink alcohol, or use illegal drugs. Some items may interact with your medicine.  What should I watch for while using this medicine?  Visit your doctor or health care professional for regular checks on your progress. Your doctor or health care professional may order blood tests and other tests to see how you are doing.  Call your doctor or health care professional if you get a cold or other infection while receiving this medicine. Do not treat yourself. This medicine may decrease your body's ability to fight infection.  You should make sure you get enough calcium and vitamin D while you are taking this medicine, unless your doctor tells you not to. Discuss the foods you eat and the vitamins you take with your health care professional.    See your dentist regularly. Brush and floss your teeth as directed. Before you have any dental work done, tell your dentist you are receiving this medicine.  Do not become pregnant while taking this medicine or for 5 months after stopping it. Women should inform their doctor if they wish to become pregnant or think they might be pregnant. There is a potential for serious side effects to an unborn child. Talk to your health care professional or pharmacist for more  information.  What side effects may I notice from receiving this medicine?  Side effects that you should report to your doctor or health care professional as soon as possible:  -allergic reactions like skin rash, itching or hives, swelling of the face, lips, or tongue  -breathing problems  -chest pain  -fast, irregular heartbeat  -feeling faint or lightheaded, falls  -fever, chills, or any other sign of infection  -muscle spasms, tightening, or twitches  -numbness or tingling  -skin blisters or bumps, or is dry, peels, or red  -slow healing or unexplained pain in the mouth or jaw  -unusual bleeding or bruising  Side effects that usually do not require medical attention (Report these to your doctor or health care professional if they continue or are bothersome.):  -muscle pain  -stomach upset, gas  This list may not describe all possible side effects. Call your doctor for medical advice about side effects. You may report side effects to FDA at 1-800-FDA-1088.  Where should I keep my medicine?  This medicine is only given in a clinic, doctor's office, or other health care setting and will not be stored at home.  NOTE: This sheet is a summary. It may not cover all possible information. If you have questions about this medicine, talk to your doctor, pharmacist, or health care provider.  © 2014, [employer]. (2011-10-14 12:37:47)

## 2013-10-08 NOTE — Telephone Encounter (Signed)
m, °

## 2013-10-12 ENCOUNTER — Telehealth: Payer: Self-pay | Admitting: *Deleted

## 2013-10-12 NOTE — Telephone Encounter (Signed)
This RN spoke with pt per her call inquiring about lab results obtained at visit last week - " [name] told me I could call and get the results of my tumor marker ".  This RN obtained result and informed pt- per discussion she stated concern due to "it is up from my last reading" This RN inquired further per results showing decrease from last reading with pt clarifying she obtains lab at local lab " when I am not scheduled for a visit at your clinic"  This RN discussed possible variations in reading of CA27.29 due to daily body changes as well as calibrations of machines at different facilities- best to look at overall trend which presently is stable.  Pt verbalized understanding and appreciation per discussion- she also requested copy to be faxed to her work number. This RN informed pt- due to privacy concern- labs cannot be faxed to nonmedical facilities. Pt's labs can be mailed to her. Verified address and labs placed in out mail box.

## 2013-10-26 ENCOUNTER — Telehealth: Payer: Self-pay | Admitting: *Deleted

## 2013-10-26 NOTE — Telephone Encounter (Signed)
No entry needed

## 2013-11-05 ENCOUNTER — Ambulatory Visit: Payer: Medicare PPO | Admitting: Pharmacist

## 2013-11-05 ENCOUNTER — Telehealth: Payer: Self-pay | Admitting: Pharmacist

## 2013-11-05 DIAGNOSIS — C50919 Malignant neoplasm of unspecified site of unspecified female breast: Secondary | ICD-10-CM

## 2013-11-05 DIAGNOSIS — Z8672 Personal history of thrombophlebitis: Secondary | ICD-10-CM

## 2013-11-05 LAB — POCT INR: INR: 2

## 2013-11-05 NOTE — Progress Notes (Signed)
Spoke with patient over the phone She had her INR drawn at PCP's, Dr. Lizbeth Bark INR=2.0 (goal 1.5-2.5) Continue taking 4mg  daily except 2mg  on MWF. Recheck INR in 1 month (12/13/13) with your appmt with Dr. [name]. Lab at 12:30, MD appmt 1:00 and CC at 1:30.  Pt had not changes to report. Was confused on her next appmt with Magrinat and PET scan. She thought July 14 and 17, when they are Aug 14 and 17. She is now aware of correct dates and times.

## 2013-11-05 NOTE — Telephone Encounter (Signed)
Documented under anticoag encounter.

## 2013-11-05 NOTE — Patient Instructions (Signed)
Continue taking 4mg  daily except 2mg  on MWF. Recheck INR in 1 month (12/13/13) with your appmt with Dr. [name]. Lab at 12:30, MD appmt 1:00 and CC at 1:30.

## 2013-11-08 ENCOUNTER — Other Ambulatory Visit: Payer: Self-pay | Admitting: *Deleted

## 2013-11-08 DIAGNOSIS — C50919 Malignant neoplasm of unspecified site of unspecified female breast: Secondary | ICD-10-CM

## 2013-11-08 DIAGNOSIS — C7951 Secondary malignant neoplasm of bone: Principal | ICD-10-CM

## 2013-11-08 MED ORDER — HYDROCODONE-ACETAMINOPHEN 10-325 MG PO TABS: 1.0000 | ORAL_TABLET | ORAL | Status: DC | PRN

## 2013-11-09 ENCOUNTER — Ambulatory Visit: Payer: Medicare PPO | Admitting: Internal Medicine

## 2013-11-11 ENCOUNTER — Telehealth: Payer: Self-pay

## 2013-11-11 NOTE — Telephone Encounter (Signed)
Returned pt call - she wanted Val to know she had received her prescription.  Confirmed no appt on 7/17.  Next appts are on 12/13/13.  Pt voiced understanding.

## 2013-11-12 ENCOUNTER — Ambulatory Visit: Payer: Medicare PPO

## 2013-11-19 ENCOUNTER — Other Ambulatory Visit: Payer: Self-pay | Admitting: *Deleted

## 2013-11-19 ENCOUNTER — Encounter: Payer: Self-pay | Admitting: *Deleted

## 2013-11-19 DIAGNOSIS — C50919 Malignant neoplasm of unspecified site of unspecified female breast: Secondary | ICD-10-CM

## 2013-11-19 DIAGNOSIS — C7952 Secondary malignant neoplasm of bone marrow: Secondary | ICD-10-CM

## 2013-11-19 DIAGNOSIS — C7951 Secondary malignant neoplasm of bone: Secondary | ICD-10-CM

## 2013-11-19 MED ORDER — PALBOCICLIB 125 MG PO CAPS: 125.0000 mg | ORAL_CAPSULE | Freq: Every day | ORAL | Status: DC

## 2013-11-19 MED ORDER — LETROZOLE 2.5 MG PO TABS: 2.5000 mg | ORAL_TABLET | Freq: Every day | ORAL | Status: DC

## 2013-11-19 NOTE — Progress Notes (Signed)
RECEIVED A FAX FROM [facility] CONCERNING A REFILL REQUEST FOR IBRANCE AND LETROZOLE. THIS REQUEST WAS GIVEN TO DR.MAGRINAT'S NURSE, VAL DODD,RN.

## 2013-11-22 ENCOUNTER — Telehealth: Payer: Self-pay | Admitting: *Deleted

## 2013-11-22 ENCOUNTER — Ambulatory Visit (INDEPENDENT_AMBULATORY_CARE_PROVIDER_SITE_OTHER): Payer: Medicare PPO | Admitting: Internal Medicine

## 2013-11-22 ENCOUNTER — Encounter: Payer: Self-pay | Admitting: Internal Medicine

## 2013-11-22 VITALS — BP 116/70 | HR 66 | Temp 98.1°F | Ht 64.0 in | Wt 152.6 lb

## 2013-11-22 DIAGNOSIS — R110 Nausea: Secondary | ICD-10-CM

## 2013-11-22 DIAGNOSIS — K3189 Other diseases of stomach and duodenum: Secondary | ICD-10-CM

## 2013-11-22 DIAGNOSIS — R1013 Epigastric pain: Secondary | ICD-10-CM

## 2013-11-22 NOTE — Progress Notes (Deleted)
Primary Care Physician:  Curlene Labrum, MD Primary Gastroenterologist:  Dr. [name]  Pre-Procedure History & Physical: HPI:  [name] is a 68 y.o. female here for   Past Medical History  Diagnosis Date  . Cancer     RBreast, Lower back , Under l Arm  . Hypertension   . GERD (gastroesophageal reflux disease)   . Breast cancer metastasized to bone 04/30/2011  . Radiation 01/15/2012    Lumbosacral spine 3500 cGy 14  fx  . Swollen R ankle 08/10/2012  . Fracture of ankle, medial malleolus, right, closed 08/10/2012  . Tubular adenoma 2011  . Hiatal hernia 2011  . Schatzki's ring   . Anorexia 03/02/2013  . Weight loss due to medication 03/02/2013    Past Surgical History  Procedure Laterality Date  . Gallbladder surgery  1991  . Mastectomy  06/28/10    bilateral- Dr. Rosebud Poles, [place]  . Inguinal hernia repair  90's    right  . Parathyroidectomy  90's  . Carpal tunnel release  90's  . Oophorectomy  90's    right  . Lymph node dissection    . Breast surgery    . Cholecystectomy    . Colonoscopy  01/08/10    Dr. [name]- anal tag,hemorrhoid o/w normal rectum, pancolonic diverticula, diminutive polyp in the base of the cecum= tubular adenoma on bx. poor prep  . Esophagogastroduodenoscopy  01/08/10    Dr. [name]- normal esophagus, small hiatal hernia, antum and body erosions, pedunculated polyp between D1 and D2, duodenal diverticulum, lymphangiectasia, second portion of the duodenum.stomach bx= inflammation, duodenum bx= adenoma  . Rotator cuff repair  08/27/11    Dr. Alphonzo Cruise- [facility]- [facility]  . Esophagogastroduodenoscopy (egd) with propofol N/A 11/19/2012    RMR: non-critical Schatzki's ring s/p dilation with 77 F, multiple gastric polyps, duodenal polyp s/p piecemeal snare polypectomy, gastric polypectomy, path benign  . Maloney dilation N/A 11/19/2012    Procedure: Venia Minks DILATION;  Surgeon: Daneil Dolin, MD;  Location: [facility];  Service: Endoscopy;  Laterality: N/A;   #54  . Polypectomy N/A 11/19/2012    Procedure: POLYPECTOMY;  Surgeon: Daneil Dolin, MD;  Location: [facility];  Service: Endoscopy;  Laterality: N/A;  duodenal  . Esophageal biopsy N/A 11/19/2012    Procedure: BIOPSY;  Surgeon: Daneil Dolin, MD;  Location: [facility];  Service: Endoscopy;  Laterality: N/A;  esophageal    Prior to Admission medications   Medication Sig Start Date End Date Taking? Authorizing Provider  cholecalciferol (VITAMIN D) 1000 UNITS tablet Take 2,000 Units by mouth daily.   Yes Historical Provider, MD  Homeopathic Products (ARNICA) GEL Apply topically every morning. Feet, legs to the knees   Yes Historical Provider, MD  HYDROcodone-acetaminophen (NORCO) 10-325 MG per tablet Take 1-2 tablets by mouth every 4 (four) hours as needed for moderate pain or severe pain. 11/08/13  Yes Chauncey Cruel, MD  letrozole [facility]) 2.5 MG tablet Take 1 tablet (2.5 mg total) by mouth daily. 11/19/13  Yes Chauncey Cruel, MD  lisinopril (PRINIVIL,ZESTRIL) 20 MG tablet Take 20 mg by mouth daily.  10/08/10  Yes Historical Provider, MD  loperamide (IMODIUM A-D) 2 MG tablet Take 2 mg by mouth 4 (four) times daily as needed for diarrhea or loose stools.   Yes Historical Provider, MD  mometasone (ELOCON) 0.1 % lotion Apply topically daily as needed. Use as directed 06/29/12  Yes Annia Belt, MD  ondansetron (ZOFRAN-ODT) 8 MG disintegrating tablet Take 1  tablet (8 mg total) by mouth every 8 (eight) hours as needed for nausea. 07/16/13  Yes Annia Belt, MD  palbociclib [name] 125 MG capsule Take 1 capsule (125 mg total) by mouth daily. Take 21days out of 28 with Letrozole, with food 11/19/13  Yes Chauncey Cruel, MD  pantoprazole (PROTONIX) 20 MG tablet Take 1 tablet (20 mg total) by mouth every other day. or as needed 08/24/13  Yes Chauncey Cruel, MD  warfarin (COUMADIN) 4 MG tablet Take 2-4 mg by mouth daily. 2mg  on Mon.,Wed.,Fri., and 4mg  all other days 08/16/10  Yes Historical  Provider, MD  diphenoxylate-atropine (LOMOTIL) 2.5-0.025 MG per tablet Take 2 tablets by mouth as needed for diarrhea or loose stools.  01/23/12   Historical Provider, MD  nitrofurantoin, macrocrystal-monohydrate, (MACROBID) 100 MG capsule  07/16/13   Historical Provider, MD    Allergies as of 11/22/2013 - Review Complete 11/22/2013  Allergen Reaction Noted  . Aspirin  11/13/2012  . Percodan [oxycodone-aspirin] Nausea Only   . Codeine Nausea Only     Family History  Problem Relation Age of Onset  . Cancer Mother     Colon  . Cancer Father     Lung, Mouth,leg    History   Social History  . Marital Status: Widowed    Spouse Name: N/A    Number of Children: N/A  . Years of Education: N/A   Occupational History  . Not on file.   Social History Main Topics  . Smoking status: Former Smoker    Quit date: 11/22/1982  . Smokeless tobacco: Not on file     Comment: FQuit smoking x 32 years  . Alcohol Use: No  . Drug Use: Not on file  . Sexual Activity: Not on file   Other Topics Concern  . Not on file   Social History Narrative  . No narrative on file    Review of Systems: See HPI, otherwise negative ROS  Physical Exam: BP 116/70  Pulse 66  Temp(Src) 98.1 F (36.7 C) (Oral)  Ht 5\' 4"  (1.626 m)  Wt 152 lb 9.6 oz (69.219 kg)  BMI 26.18 kg/m2 General:   Alert,  Well-developed, well-nourished, pleasant and cooperative in NAD Skin:  Intact without significant lesions or rashes. Eyes:  Sclera clear, no icterus.   Conjunctiva pink. Ears:  Normal auditory acuity. Nose:  No deformity, discharge,  or lesions. Mouth:  No deformity or lesions. Neck:  Supple; no masses or thyromegaly. No significant cervical adenopathy. Lungs:  Clear throughout to auscultation.   No wheezes, crackles, or rhonchi. No acute distress. Heart:  Regular rate and rhythm; no murmurs, clicks, rubs,  or gallops. Abdomen: Non-distended, normal bowel sounds.  Soft and nontender without appreciable mass  or hepatosplenomegaly.  Pulses:  Normal pulses noted. Extremities:  Without clubbing or edema.  Impression/Plan:  ***     Notice: This dictation was prepared with Dragon dictation along with smaller phrase technology. Any transcriptional errors that result from this process are unintentional and may not be corrected upon review.

## 2013-11-22 NOTE — Telephone Encounter (Signed)
Message left for pt to return call to review lab and restart of Ibrance-  Per MD review pt should continue on current dose of 125mg .

## 2013-11-22 NOTE — Patient Instructions (Signed)
Continue to take Protonix 20 mg at bedtime (may try increasing to 40 mg as needed)  Continue Zofran as needed for nausea  Office visit in 3 months  Call if any interim problems

## 2013-11-22 NOTE — Telephone Encounter (Signed)
This RN left message for a return call on pt's VM per need to review lab result and expected restart of Ibrance.

## 2013-11-22 NOTE — Progress Notes (Signed)
Primary Care Physician:  Curlene Labrum, MD Primary Gastroenterologist:  Dr. [name]  Pre-Procedure History & Physical: HPI:  [name] is a 68 y.o. female here for for followup of GERD/dyspepsia and nausea in a setting of widely metastatic/recurrent breast cancer. Was not taking Protonix regularly. Dr. [name] asked her to try to start taking protonix 20 mg in the evening. This has been associated with marked improvement in her dyspeptic/GERD symptoms. She is really not having any abdominal pain at this time. She denies constipation or diarrhea -  having 1 formed bowel movement daily. Occasional nauses managed  With Zofran  prn. Weight is stable. 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  Past Medical History  Diagnosis Date  . Cancer     RBreast, Lower back , Under l Arm  . Hypertension   . GERD (gastroesophageal reflux disease)   . Breast cancer metastasized to bone 04/30/2011  . Radiation 01/15/2012    Lumbosacral spine 3500 cGy 14  fx  . Swollen R ankle 08/10/2012  . Fracture of ankle, medial malleolus, right, closed 08/10/2012  . Tubular adenoma 2011  . Hiatal hernia 2011  . Schatzki's ring   . Anorexia 03/02/2013  . Weight loss due to medication 03/02/2013    Past Surgical History  Procedure Laterality Date  . Gallbladder surgery  1991  . Mastectomy  06/28/10    bilateral- Dr. Rosebud Poles, [place]  . Inguinal hernia repair  90's    right  . Parathyroidectomy  90's  . Carpal tunnel release  90's  . Oophorectomy  90's    right  . Lymph node dissection    . Breast surgery    . Cholecystectomy    . Colonoscopy  01/08/10    Dr. [name]- anal tag,hemorrhoid o/w normal rectum, pancolonic diverticula, diminutive polyp in the base of the cecum= tubular adenoma on bx. poor prep  . Esophagogastroduodenoscopy  01/08/10    Dr. [name]- normal esophagus, small hiatal hernia, antum and body erosions, pedunculated polyp between D1 and D2,  duodenal diverticulum, lymphangiectasia, second portion of the duodenum.stomach bx= inflammation, duodenum bx= adenoma  . Rotator cuff repair  08/27/11    Dr. Alphonzo Cruise- [facility]- [facility]  . Esophagogastroduodenoscopy (egd) with propofol N/A 11/19/2012    RMR: non-critical Schatzki's ring s/p dilation with 70 F, multiple gastric polyps, duodenal polyp s/p piecemeal snare polypectomy, gastric polypectomy, path benign  . Maloney dilation N/A 11/19/2012    Procedure: Venia Minks DILATION;  Surgeon: Daneil Dolin, MD;  Location: [facility];  Service: Endoscopy;  Laterality: N/A;  #54  . Polypectomy N/A 11/19/2012    Procedure: POLYPECTOMY;  Surgeon: Daneil Dolin, MD;  Location: [facility];  Service: Endoscopy;  Laterality: N/A;  duodenal  . Esophageal biopsy N/A 11/19/2012    Procedure: BIOPSY;  Surgeon: Daneil Dolin, MD;  Location: [facility];  Service: Endoscopy;  Laterality: N/A;  esophageal    Prior to Admission medications   Medication Sig Start Date End Date Taking? Authorizing Provider  cholecalciferol (VITAMIN D) 1000 UNITS tablet Take 2,000 Units by mouth daily.   Yes Historical Provider, MD  Homeopathic Products (ARNICA) GEL Apply topically every morning. Feet, legs to the knees   Yes Historical Provider, MD  HYDROcodone-acetaminophen (NORCO) 10-325 MG per tablet Take 1-2 tablets by mouth every 4 (four) hours as needed for moderate pain or severe pain. 11/08/13  Yes Chauncey Cruel, MD  letrozole [name]) 2.5 MG tablet Take 1 tablet (2.5  mg total) by mouth daily. 11/19/13  Yes Chauncey Cruel, MD  lisinopril (PRINIVIL,ZESTRIL) 20 MG tablet Take 20 mg by mouth daily.  10/08/10  Yes Historical Provider, MD  loperamide (IMODIUM A-D) 2 MG tablet Take 2 mg by mouth 4 (four) times daily as needed for diarrhea or loose stools.   Yes Historical Provider, MD  mometasone (ELOCON) 0.1 % lotion Apply topically daily as needed. Use as directed 06/29/12  Yes Annia Belt, MD  ondansetron (ZOFRAN-ODT) 8 MG disintegrating  tablet Take 1 tablet (8 mg total) by mouth every 8 (eight) hours as needed for nausea. 07/16/13  Yes Annia Belt, MD  palbociclib [name] 125 MG capsule Take 1 capsule (125 mg total) by mouth daily. Take 21days out of 28 with Letrozole, with food 11/19/13  Yes Chauncey Cruel, MD  pantoprazole (PROTONIX) 20 MG tablet Take 1 tablet (20 mg total) by mouth every other day. or as needed 08/24/13  Yes Chauncey Cruel, MD  warfarin (COUMADIN) 4 MG tablet Take 2-4 mg by mouth daily. 2mg  on Mon.,Wed.,Fri., and 4mg  all other days 08/16/10  Yes Historical Provider, MD  diphenoxylate-atropine (LOMOTIL) 2.5-0.025 MG per tablet Take 2 tablets by mouth as needed for diarrhea or loose stools.  01/23/12   Historical Provider, MD  nitrofurantoin, macrocrystal-monohydrate, (MACROBID) 100 MG capsule  07/16/13   Historical Provider, MD    Allergies as of 11/22/2013 - Review Complete 11/22/2013  Allergen Reaction Noted  . Aspirin  11/13/2012  . Percodan [oxycodone-aspirin] Nausea Only   . Codeine Nausea Only     Family History  Problem Relation Age of Onset  . Cancer Mother     Colon  . Cancer Father     Lung, Mouth,leg    History   Social History  . Marital Status: Widowed    Spouse Name: N/A    Number of Children: N/A  . Years of Education: N/A   Occupational History  . Not on file.   Social History Main Topics  . Smoking status: Former Smoker    Quit date: 11/22/1982  . Smokeless tobacco: Not on file     Comment: FQuit smoking x 32 years  . Alcohol Use: No  . Drug Use: Not on file  . Sexual Activity: Not on file   Other Topics Concern  . Not on file   Social History Narrative  . No narrative on file    Review of Systems: See HPI, otherwise negative ROS  Physical Exam: BP 116/70  Pulse 66  Temp(Src) 98.1 F (36.7 C) (Oral)  Ht 5\' 4"  (1.626 m)  Wt 152 lb 9.6 oz (69.219 kg)  BMI 26.18 kg/m2 General:   Alert,  pleasant and cooperative in NAD Skin:  Intact without  significant lesions or rashes. Eyes:  Sclera clear, no icterus.   Conjunctiva pink. Ears:  Normal auditory acuity. Nose:  No deformity, discharge,  or lesions. Mouth:  No deformity or lesions. Neck:  Supple; no masses or thyromegaly. No significant cervical adenopathy. Lungs:  Clear throughout to auscultation.   No wheezes, crackles, or rhonchi. No acute distress. Heart:  Regular rate and rhythm; no murmurs, clicks, rubs,  or gallops. Abdomen: Non-distended, normal bowel sounds.  Soft and nontender without appreciable mass or hepatosplenomegaly.  Pulses:  Normal pulses noted. Extremities:  Without clubbing or edema.  Impression:   Pleasant 68 year old lady with widely metastatic breast cancer ongoing therapy. GERD/dyspepsia much better with low-dose PPI therapy at bedtime. Occasional nausea well-managed with  Zofran. No bowel symptoms at this time. All in all, this lady is seems to be doing  OK with GI issues relative to her other medical problems.  Recommendations:  Continue to take Protonix 20 mg at bedtime (may try increasing to 40 mg as needed)  Continue Zofran as needed for nausea  Office visit in 3 months  Call if any interim problems    Notice: This dictation was prepared with Dragon dictation along with smaller phrase technology. Any transcriptional errors that result from this process are unintentional and may not be corrected upon review.

## 2013-11-24 ENCOUNTER — Other Ambulatory Visit: Payer: Self-pay | Admitting: *Deleted

## 2013-11-24 ENCOUNTER — Telehealth: Payer: Self-pay

## 2013-11-24 NOTE — Telephone Encounter (Signed)
Pt confirms she starts Ibrance back on Sunday and states she is not happy with her tumor marker.    [facility] results sent to scan.

## 2013-11-25 ENCOUNTER — Other Ambulatory Visit: Payer: Self-pay | Admitting: Oncology

## 2013-12-09 ENCOUNTER — Telehealth: Payer: Self-pay

## 2013-12-09 ENCOUNTER — Other Ambulatory Visit: Payer: Self-pay | Admitting: Oncology

## 2013-12-09 NOTE — Telephone Encounter (Signed)
Rcvd faxed cbc results from [place] 12/06/13.  Copy to [place].  Original to scan.

## 2013-12-10 ENCOUNTER — Ambulatory Visit (HOSPITAL_COMMUNITY)
Admission: RE | Admit: 2013-12-10 | Discharge: 2013-12-10 | Disposition: A | Discharge status: Home / Self Care | Payer: Medicare PPO | Source: Ambulatory Visit | Attending: Diagnostic Radiology | Admitting: Diagnostic Radiology

## 2013-12-10 ENCOUNTER — Encounter (HOSPITAL_COMMUNITY): Payer: Self-pay

## 2013-12-10 DIAGNOSIS — C7952 Secondary malignant neoplasm of bone marrow: Secondary | ICD-10-CM

## 2013-12-10 DIAGNOSIS — C50919 Malignant neoplasm of unspecified site of unspecified female breast: Secondary | ICD-10-CM | POA: Diagnosis present

## 2013-12-10 DIAGNOSIS — C7951 Secondary malignant neoplasm of bone: Secondary | ICD-10-CM | POA: Diagnosis not present

## 2013-12-10 LAB — GLUCOSE, CAPILLARY: GLUCOSE-CAPILLARY: 104 mg/dL — AB (ref 70–99)

## 2013-12-10 MED ORDER — FLUDEOXYGLUCOSE F - 18 (FDG) INJECTION: 7.3000 | Freq: Once | INTRAVENOUS | Status: AC | PRN

## 2013-12-13 ENCOUNTER — Telehealth: Payer: Self-pay | Admitting: Oncology

## 2013-12-13 ENCOUNTER — Ambulatory Visit: Payer: Medicare PPO

## 2013-12-13 ENCOUNTER — Ambulatory Visit (HOSPITAL_BASED_OUTPATIENT_CLINIC_OR_DEPARTMENT_OTHER): Payer: Medicare PPO | Admitting: Oncology

## 2013-12-13 ENCOUNTER — Other Ambulatory Visit (HOSPITAL_BASED_OUTPATIENT_CLINIC_OR_DEPARTMENT_OTHER): Payer: Medicare PPO

## 2013-12-13 VITALS — BP 152/74 | HR 66 | Temp 98.2°F | Resp 20 | Ht 64.0 in | Wt 149.9 lb

## 2013-12-13 DIAGNOSIS — C7951 Secondary malignant neoplasm of bone: Secondary | ICD-10-CM

## 2013-12-13 DIAGNOSIS — C50919 Malignant neoplasm of unspecified site of unspecified female breast: Secondary | ICD-10-CM

## 2013-12-13 DIAGNOSIS — Z7901 Long term (current) use of anticoagulants: Secondary | ICD-10-CM

## 2013-12-13 DIAGNOSIS — C7952 Secondary malignant neoplasm of bone marrow: Secondary | ICD-10-CM

## 2013-12-13 DIAGNOSIS — Z8672 Personal history of thrombophlebitis: Secondary | ICD-10-CM

## 2013-12-13 DIAGNOSIS — Z170 Estrogen receptor positive status [ER+]: Secondary | ICD-10-CM

## 2013-12-13 DIAGNOSIS — C773 Secondary and unspecified malignant neoplasm of axilla and upper limb lymph nodes: Secondary | ICD-10-CM

## 2013-12-13 DIAGNOSIS — C50519 Malignant neoplasm of lower-outer quadrant of unspecified female breast: Secondary | ICD-10-CM

## 2013-12-13 LAB — PROTIME-INR
INR: 2.6 (ref 2.00–3.50)
PROTIME: 31.2 s — AB (ref 10.6–13.4)

## 2013-12-13 NOTE — Telephone Encounter (Signed)
per GM to sch pt appt-sch & gave pt copy of sch

## 2013-12-13 NOTE — Progress Notes (Signed)
[place]  [phone] [phone]     ID: [name] OB: Mar 13, 1946  MR#: [id number]  UUE#:[id number]  PCP: Curlene Labrum, MD GYN:  Gari Crown  SU:  OTHER MD: Gery Pray, Sherre Poot  CHIEF COMPLAINT: stage IV breast cancer CURRENT TREATMENT: letrozole + palbocilcib [name]  BREAST CANCER HISTORY: From Dr. Adria Dill summary 07/14/2013::  "Complex 68 year old woman with history of ER-positive and ER negative metachronous primary bilateral breast cancers.  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 Progression in bone while on tamoxifen September 2011. Initial complete response to monthly Faslodex injections.She developed a malignant left axillary lymph node with no clear primary in the left breast November 2011. This tumor was ER negative. HER-2 negative. While under evaluation for this lesion she developed a new lesion in the right breast which was ER positive. She underwent bilateral mastectomies 06/28/2010. She was continued on hormonal therapy with monthly Faslodex injections in view of the ER positive component of her tumor with a complete response to this hormone in bone. She underwent radiation to the left chest and axilla subsequent to the mastectomies. She developed a cutaneous recurrence on the right chest wall along the medial aspect of the right mastectomy scar, biopsy proven, initially detected on clinical exam by her radiation oncologist on 02/14/2011. She was treated with electron beam radiation.. 3000 cGy in 15 fractions through December 2012. Further progression in bone in August 2013. Palliative radiation to lumbosacral spine September through October 2013. Brief trial of Xeloda November 2013 through January 2014. Change to  tamoxifen plus Affinitor February 2014 stopped for progression and 25 pound weight loss November 2014. Trial of Megace 80 mg twice a day with improved appetite but progressive rise in tumor marker and progressive low back pain with further progression on MRI of the spine done January 2015. Radiation to the lumbar spine February 2015. She started most recent salvage regimen with a combination of Ibrance plus Femara on 07/08/2013. It is too early to assess response."  The patient's breast cancer history is summarized in detailed below   INTERVAL HISTORY: [name] returns today for followup of her stage IV breast cancer. The interval history is unremarkable. She continues to work full-time. She is on letrozole and Palbociclib. Although she has many symptoms, it is not clear to me that her symptoms are related to either of these medications. We are checking her labwork every 2 weeks for her primary care physician in [place]. In particular her neutrophil count is holding steady at 1.4  REVIEW OF SYSTEMS: She feels very chilly in this room. She sleeps poorly. She is moderately fatigued. Just cramps and muscle aches intermittently. She gets back pain particularly towards the end of a full workday. She is "on her feet 9 hours a day". She has ringing in her ears and some sinus problems and postnasal drip. Her gums are "not in good shape. Sometimes she has a sore throat and is hard for her to swallow. She has palpitations and she tells me her primary care physician set her up for an echo to investigate and murmur" it does not require any intervention at present". She can be short of breath when walking up stairs but not otherwise. Her appetite is poor, she has heartburn, she has occasional loose bowel movements, and her sense of taste is "shot". She  has poor urinary continence. Her skin itches at times. She feels weak and forgetful and anxious but denies depression. A detailed review of systems today was otherwise  noncontributory  PAST MEDICAL HISTORY: Past Medical History  Diagnosis Date  . Cancer     RBreast, Lower back , Under l Arm  . Hypertension   . GERD (gastroesophageal reflux disease)   . Breast cancer metastasized to bone 04/30/2011  . Radiation 01/15/2012    Lumbosacral spine 3500 cGy 14  fx  . Swollen R ankle 08/10/2012  . Fracture of ankle, medial malleolus, right, closed 08/10/2012  . Tubular adenoma 2011  . Hiatal hernia 2011  . Schatzki's ring   . Anorexia 03/02/2013  . Weight loss due to medication 03/02/2013    PAST SURGICAL HISTORY: Past Surgical History  Procedure Laterality Date  . Gallbladder surgery  1991  . Mastectomy  06/28/10    bilateral- Dr. Rosebud Poles, [place]  . Inguinal hernia repair  90's    right  . Parathyroidectomy  90's  . Carpal tunnel release  90's  . Oophorectomy  90's    right  . Lymph node dissection    . Breast surgery    . Cholecystectomy    . Colonoscopy  01/08/10    Dr. [name]- anal tag,hemorrhoid o/w normal rectum, pancolonic diverticula, diminutive polyp in the base of the cecum= tubular adenoma on bx. poor prep  . Esophagogastroduodenoscopy  01/08/10    Dr. [name]- normal esophagus, small hiatal hernia, antum and body erosions, pedunculated polyp between D1 and D2, duodenal diverticulum, lymphangiectasia, second portion of the duodenum.stomach bx= inflammation, duodenum bx= adenoma  . Rotator cuff repair  08/27/11    Dr. Alphonzo Cruise- [facility]- [place] [facility]  . Esophagogastroduodenoscopy (egd) with propofol N/A 11/19/2012    RMR: non-critical Schatzki's ring s/p dilation with 25 F, multiple gastric polyps, duodenal polyp s/p piecemeal snare polypectomy, gastric polypectomy, path benign  . Maloney dilation N/A 11/19/2012    Procedure: Venia Minks DILATION;  Surgeon: Daneil Dolin, MD;  Location: [facility];  Service: Endoscopy;  Laterality: N/A;  #[id number]  . Polypectomy N/A 11/19/2012    Procedure: POLYPECTOMY;  Surgeon: Daneil Dolin, MD;  Location: [facility];  Service:  Endoscopy;  Laterality: N/A;  duodenal  . Esophageal biopsy N/A 11/19/2012    Procedure: BIOPSY;  Surgeon: Daneil Dolin, MD;  Location: [facility];  Service: Endoscopy;  Laterality: N/A;  esophageal    FAMILY HISTORY Family History  Problem Relation Age of Onset  . Cancer Mother     Colon  . Cancer Father     Lung, Mouth,leg   the patient's father died at the age of 51 from metastatic head and neck carcinoma. The patient's mother died of breast cancer the age of 78. The patient had 2 brothers, both with prostate cancer. She has 4 sisters, one diagnosed with breast cancer at the age of 18, another 1 recent underwent bilateral mastectomies. Another one died from colon cancer.   GYNECOLOGIC HISTORY:   menarche age 32, she is GX P0. She underwent menopause at age 3, status post HRT for 12 years. She status post right salpingo-oophorectomy.   SOCIAL HISTORY:  She works at [employer] in [place]. Her husband died at [place] from complications of surgery after a 7-1/2 month hospitalization) 2008). She lives by herself, with no pets. She tells me she works 9 hours a day 5 days a week.     ADVANCED DIRECTIVES:    HEALTH  MAINTENANCE: History  Substance Use Topics  . Smoking status: Former Smoker    Quit date: 11/22/1982  . Smokeless tobacco: Not on file     Comment: FQuit smoking x 32 years  . Alcohol Use: No     Colonoscopy:  PAP:  Bone density:  Lipid panel:  Allergies  Allergen Reactions  . Aspirin   . Percodan [Oxycodone-Aspirin] Nausea Only  . Codeine Nausea Only    Current Outpatient Prescriptions  Medication Sig Dispense Refill  . cholecalciferol (VITAMIN D) 1000 UNITS tablet Take 2,000 Units by mouth daily.      . diphenoxylate-atropine (LOMOTIL) 2.5-0.025 MG per tablet Take 2 tablets by mouth as needed for diarrhea or loose stools.       . Homeopathic Products (ARNICA) GEL Apply topically every morning. Feet, legs to the knees      .  HYDROcodone-acetaminophen (NORCO) 10-325 MG per tablet Take 1-2 tablets by mouth every 4 (four) hours as needed for moderate pain or severe pain.  360 tablet  0  . letrozole (FEMARA) 2.5 MG tablet Take 1 tablet (2.5 mg total) by mouth daily.  28 tablet  3  . lisinopril (PRINIVIL,ZESTRIL) 20 MG tablet Take 20 mg by mouth daily.       Marland Kitchen loperamide (IMODIUM A-D) 2 MG tablet Take 2 mg by mouth 4 (four) times daily as needed for diarrhea or loose stools.      . mometasone (ELOCON) 0.1 % lotion Apply topically daily as needed. Use as directed  30 mL  5  . nitrofurantoin, macrocrystal-monohydrate, (MACROBID) 100 MG capsule       . ondansetron (ZOFRAN-ODT) 8 MG disintegrating tablet DISSOLVE 1 TABLET IN MOUTH EVERY 8 HOURS AS NEEDED FOR NAUSEA  20 tablet  0  . palbociclib (IBRANCE) 125 MG capsule Take 1 capsule (125 mg total) by mouth daily. Take 21days out of 28 with Letrozole, with food  21 capsule  3  . pantoprazole (PROTONIX) 20 MG tablet TAKE 2 TABLETS BY MOUTH TWICE DAILY  120 tablet  0  . warfarin (COUMADIN) 4 MG tablet Take 2-4 mg by mouth daily. 18m on Mon.,Wed.,Fri., and 462mall other days       No current facility-administered medications for this visit.    OBJECTIVE: Middle-aged white woman in no acute distress Filed Vitals:   12/13/13 1253  BP: 152/74  Pulse: 66  Temp: 98.2 F (36.8 C)  Resp: 20     Body mass index is 25.72 kg/(m^2).    ECOG FS:1 - Symptomatic but completely ambulatory  Sclerae unicteric, EOMs intact Oropharynx clear and moist No cervical or supraclavicular adenopathy Lungs no rales or rhonchi Heart regular rate and rhythm, no murmur appreciated Abd soft, nontender, positive bowel sounds MSK no focal spinal tenderness, no upper extremity lymphedema Neuro: nonfocal, well oriented, frustrated affect Breasts: Status post bilateral mastectomies. There is no evidence of local recurrence. Both axillae are benign.    LAB RESULTS: Outside labs reviewed, most  recently from 12/06/2013. These show a white cell count of 2.2, ANC 1.4, hemoglobin 11.6, and platelets 322,000. Creatinine is 0.70, BUN 14, glucose 93, alkaline phosphatase 60, AST 18, and ALT 8. The CA 27-29 is stable at 45.6.  No components found with this basename: [id number]     Recent Labs Lab 12/13/13 1215  INR 2.60    Urinalysis    Component Value Date/Time   COLORURINE YELLOW 06/28/2010 [place]/04/2010 0856   LABSPEC 1.024 06/28/2010 0856  PHURINE 6.0 06/28/2010 0856   HGBUR NEGATIVE 06/28/2010 0856   BILIRUBINUR NEGATIVE 06/28/2010 0856   KETONESUR NEGATIVE 06/28/2010 0856   PROTEINUR NEGATIVE 06/28/2010 0856   UROBILINOGEN 0.2 06/28/2010 0856   NITRITE NEGATIVE 06/28/2010 0856   LEUKOCYTESUR NEGATIVE MICROSCOPIC NOT DONE ON URINES WITH NEGATIVE PROTEIN, BLOOD, LEUKOCYTES, NITRITE, OR GLUCOSE <1000 mg/dL. 06/28/2010 0856    STUDIES: Nm Pet Image Restag (ps) Skull Base To Thigh  12/10/2013   CLINICAL DATA:  Subsequent treatment strategy for breast cancer.  EXAM: NUCLEAR MEDICINE PET SKULL BASE TO THIGH  TECHNIQUE: 7.3 mCi F-18 FDG was injected intravenously. Full-ring PET imaging was performed from the skull base to thigh after the radiotracer. CT data was obtained and used for attenuation correction and anatomic localization.  FASTING BLOOD GLUCOSE:  Value: 104 mg/dl  COMPARISON:  PET CT 08/24/2013 and 09/02/2012.  FINDINGS: NECK  No hypermetabolic cervical lymph nodes are identified.There are no lesions of the pharyngeal mucosal space.  CHEST  The previously demonstrated mildly hypermetabolic lymph node in the pre-vascular space is slightly smaller, now measuring 9 x 15 mm on image 56. This demonstrates low level hypermetabolic activity with an SUV max of 3.1. The adjacent internal mammary lymph nodes appear unchanged. No hypermetabolic axillary, hilar or other mediastinal lymph nodes are demonstrated. There is no hypermetabolic pulmonary activity. Radiation changes in both  upper lobes and postsurgical changes in the breasts and axilla are stable.  ABDOMEN/PELVIS  There is no hypermetabolic activity within the liver, adrenal glands, spleen or pancreas. There is no hypermetabolic nodal activity. There is stable mild biliary dilatation status post cholecystectomy and stable cystic lesions within the liver. Mild nodularity of the left adrenal gland and aortoiliac atherosclerosis are stable.  SKELETON  Chronic osseous changes consistent with treated metastatic disease are stable. Relatively decreased marrow activity within the lumbar spine and sacrum is attributed to prior radiation therapy. No hypermetabolic osseous lesions identified.  IMPRESSION: 1. The previously demonstrated mildly hypermetabolic left internal mammary and pre-vascular lymph nodes are similar to slightly decreased in size compared with the most recent study. These retain low-level hypermetabolic activity and could reflect metastases. 2. Stable chronic treated osseous metastatic disease. 3. No other evidence of metastatic disease demonstrated.   Electronically Signed   By: Camie Patience M.D.   On: 12/10/2013 12:02    ASSESSMENT: 68 y.o. [name], [place] woman status post right lumpectomy and axillary lymph node dissection December 2006 for a T N2, stage IIIA invasive ductal carcinoma, estrogen and progesterone receptor positive, with subsequent stage IV disease  (1) received adjuvant doxorubicin, docetaxel and cyclophosphamide  (2) status post adjuvant radiation  (3) on tamoxifen to September 2011 when she was noted to have metastatic disease to bone  (4) fulvestrant 02/06/2009 to 01/15/2011  (5) left axillary lymph node positive for an invasive ductal carcinoma, estrogen receptor and HER-2 negative November 2011, and a right breast recurrence for an estrogen receptor positive lesion at the same time.  (6) status post bilateral mastectomies 06/28/2010  (7) status post left chest and axillary radiation  (8)  status post right chest wall recurrence October of 2012, treated with electron beam radiation completed December 2012  (9) progression in bone August 2013,  (10) status post radiation to the lumbosacral spine completed October 2013   (11) capecitabine November 2013 through January 2014  (12) tamoxifen/ everolimus February 2014, discontinued November 2014 with progression  (13) Megace 80 mg twice daily with progression January 2015  (14) Radiation to lumbar spine  February 2015  (15) letrozole plus Ibrance started 07/08/2013  (16). Declines genetic testing (as of 08/17/2013)  (17) zolendronate 05/01/2009, discontinued because of pain; Xgeva started 10/08/2013, to be repeated every 3 months  OTHER ACTIVE PROBLEMS:  (1) chronic GI complaints, followed by Dr. Sydell Axon  (2) multiple orthopedic problems, including bilateral chronic foot pain, status post rotator cuff repair  (3) history of extensive superficial phlebitis, on chronic low dose Coumadin.  PLAN: Casidee is doing remarkably well. I don't think any other symptoms she is having are due to her cancer, although possibly some of the pain she has in her back is due to that (since the pain only occurs after a full day of work, it may be as much muscle strain as anything else). Certainly the PET scan is very favorable. Luckily she is able to obtain the Palbociclib at no cost. The letrozole is less than $3 a month for her.  We are going to check lab work every 4 weeks starting August 24. She had questions regarding the CBC report, which showed some nucleated red blood cells. We went over the fact that this indicates involvement of the marrow by cancer. We will see if we can find a more general description of the CBC for her to understand her results better.  Otherwise she will see me again November 23, with repeat PET scan before that visit. If that continues to be stable to improved, I will change a PET scan to every 6 months.  She has a  good understanding of the overall plan. She agrees with it. She knows the goal of treatment in her case is control. She will call with any problems, particularly fever or bleeding issues, that may develop before her next visit here.   Chauncey Cruel, MD   12/13/2013 1:20 PM

## 2013-12-15 ENCOUNTER — Other Ambulatory Visit: Payer: Self-pay | Admitting: *Deleted

## 2013-12-15 ENCOUNTER — Encounter: Payer: Self-pay | Admitting: *Deleted

## 2013-12-15 DIAGNOSIS — C50919 Malignant neoplasm of unspecified site of unspecified female breast: Secondary | ICD-10-CM

## 2013-12-15 DIAGNOSIS — C7951 Secondary malignant neoplasm of bone: Principal | ICD-10-CM

## 2013-12-15 MED ORDER — HYDROCODONE-ACETAMINOPHEN 10-325 MG PO TABS: 1.0000 | ORAL_TABLET | ORAL | Status: DC | PRN

## 2013-12-15 NOTE — Progress Notes (Signed)
This RN mailed a prescription for Oxycodone to patient's home address. Address verified with patient.

## 2013-12-28 ENCOUNTER — Telehealth: Payer: Self-pay | Admitting: *Deleted

## 2014-01-06 ENCOUNTER — Encounter: Payer: Self-pay | Admitting: Internal Medicine

## 2014-01-07 ENCOUNTER — Inpatient Hospital Stay: Payer: Medicare PPO

## 2014-01-24 ENCOUNTER — Other Ambulatory Visit: Payer: Self-pay | Admitting: *Deleted

## 2014-01-24 DIAGNOSIS — C7951 Secondary malignant neoplasm of bone: Principal | ICD-10-CM

## 2014-01-24 DIAGNOSIS — C50919 Malignant neoplasm of unspecified site of unspecified female breast: Secondary | ICD-10-CM

## 2014-01-24 MED ORDER — HYDROCODONE-ACETAMINOPHEN 10-325 MG PO TABS: 1.0000 | ORAL_TABLET | ORAL | Status: DC | PRN

## 2014-02-09 ENCOUNTER — Encounter: Payer: Self-pay | Admitting: *Deleted

## 2014-02-09 NOTE — Progress Notes (Signed)
Received fax from [facility] [phone] that [name] and Letrozole has been processed and sent for delivery on 02/15/14.

## 2014-03-01 ENCOUNTER — Ambulatory Visit: Payer: Medicare PPO | Admitting: Internal Medicine

## 2014-03-02 ENCOUNTER — Other Ambulatory Visit: Payer: Self-pay | Admitting: *Deleted

## 2014-03-02 DIAGNOSIS — C50919 Malignant neoplasm of unspecified site of unspecified female breast: Secondary | ICD-10-CM

## 2014-03-02 DIAGNOSIS — C7951 Secondary malignant neoplasm of bone: Principal | ICD-10-CM

## 2014-03-02 MED ORDER — HYDROCODONE-ACETAMINOPHEN 10-325 MG PO TABS: 1.0000 | ORAL_TABLET | ORAL | Status: DC | PRN

## 2014-03-02 NOTE — Telephone Encounter (Signed)
Prescription put in outside mail box with stamp for pain medication.

## 2014-03-11 ENCOUNTER — Encounter: Payer: Self-pay | Admitting: Oncology

## 2014-03-11 ENCOUNTER — Encounter: Payer: Self-pay | Admitting: *Deleted

## 2014-03-11 NOTE — Progress Notes (Signed)
RECEIVED A FAX FROM [facility] CONCERNING A CONFIRMATION OF PRESCRIPTION SHIPMENT FOR IBRANCE AND LETROZOLE ON 03/15/14.

## 2014-03-11 NOTE — Progress Notes (Signed)
Received letter from [employer].  Pt is approved for Afinitor, Ibrance, and Letrozole from 02/13/14 to 02/13/15 or when the benefit cap has been met.  The amount of the grant is $7500.

## 2014-03-14 ENCOUNTER — Ambulatory Visit (HOSPITAL_COMMUNITY)
Admission: RE | Admit: 2014-03-14 | Discharge: 2014-03-14 | Disposition: A | Discharge status: Home / Self Care | Payer: Medicare PPO | Source: Ambulatory Visit | Attending: Oncology | Admitting: Oncology

## 2014-03-14 DIAGNOSIS — C7951 Secondary malignant neoplasm of bone: Secondary | ICD-10-CM | POA: Diagnosis not present

## 2014-03-14 DIAGNOSIS — Z9049 Acquired absence of other specified parts of digestive tract: Secondary | ICD-10-CM | POA: Diagnosis not present

## 2014-03-14 DIAGNOSIS — Z8672 Personal history of thrombophlebitis: Secondary | ICD-10-CM

## 2014-03-14 DIAGNOSIS — I7090 Unspecified atherosclerosis: Secondary | ICD-10-CM | POA: Diagnosis not present

## 2014-03-14 DIAGNOSIS — K5730 Diverticulosis of large intestine without perforation or abscess without bleeding: Secondary | ICD-10-CM | POA: Diagnosis not present

## 2014-03-14 DIAGNOSIS — C50919 Malignant neoplasm of unspecified site of unspecified female breast: Secondary | ICD-10-CM | POA: Diagnosis not present

## 2014-03-14 DIAGNOSIS — Z9013 Acquired absence of bilateral breasts and nipples: Secondary | ICD-10-CM | POA: Diagnosis not present

## 2014-03-14 DIAGNOSIS — M899 Disorder of bone, unspecified: Secondary | ICD-10-CM | POA: Diagnosis not present

## 2014-03-14 LAB — GLUCOSE, CAPILLARY: Glucose-Capillary: 94 mg/dL (ref 70–99)

## 2014-03-14 MED ORDER — FLUDEOXYGLUCOSE F - 18 (FDG) INJECTION
7.7000 | Freq: Once | INTRAVENOUS | Status: AC | PRN
  Administered 2014-03-14: 7.7 via INTRAVENOUS

## 2014-03-21 ENCOUNTER — Ambulatory Visit: Payer: Medicare PPO

## 2014-03-21 ENCOUNTER — Ambulatory Visit (HOSPITAL_COMMUNITY)
Admission: RE | Admit: 2014-03-21 | Discharge: 2014-03-21 | Disposition: A | Discharge status: Home / Self Care | Payer: Medicare PPO | Source: Ambulatory Visit | Attending: Oncology | Admitting: Oncology

## 2014-03-21 ENCOUNTER — Telehealth: Payer: Self-pay | Admitting: Oncology

## 2014-03-21 ENCOUNTER — Ambulatory Visit (HOSPITAL_BASED_OUTPATIENT_CLINIC_OR_DEPARTMENT_OTHER): Payer: Medicare PPO | Admitting: Oncology

## 2014-03-21 ENCOUNTER — Other Ambulatory Visit: Payer: Self-pay | Admitting: Oncology

## 2014-03-21 VITALS — BP 145/79 | HR 71 | Temp 98.3°F | Resp 18 | Ht 62.5 in | Wt 145.8 lb

## 2014-03-21 DIAGNOSIS — C50919 Malignant neoplasm of unspecified site of unspecified female breast: Secondary | ICD-10-CM | POA: Diagnosis not present

## 2014-03-21 DIAGNOSIS — C7951 Secondary malignant neoplasm of bone: Secondary | ICD-10-CM | POA: Insufficient documentation

## 2014-03-21 DIAGNOSIS — M25471 Effusion, right ankle: Secondary | ICD-10-CM

## 2014-03-21 DIAGNOSIS — M7989 Other specified soft tissue disorders: Secondary | ICD-10-CM | POA: Insufficient documentation

## 2014-03-21 DIAGNOSIS — R609 Edema, unspecified: Secondary | ICD-10-CM

## 2014-03-21 DIAGNOSIS — Z8672 Personal history of thrombophlebitis: Secondary | ICD-10-CM

## 2014-03-21 DIAGNOSIS — C773 Secondary and unspecified malignant neoplasm of axilla and upper limb lymph nodes: Secondary | ICD-10-CM

## 2014-03-21 DIAGNOSIS — C50911 Malignant neoplasm of unspecified site of right female breast: Secondary | ICD-10-CM

## 2014-03-21 DIAGNOSIS — C50511 Malignant neoplasm of lower-outer quadrant of right female breast: Secondary | ICD-10-CM

## 2014-03-21 DIAGNOSIS — Z170 Estrogen receptor positive status [ER+]: Secondary | ICD-10-CM

## 2014-03-21 DIAGNOSIS — I809 Phlebitis and thrombophlebitis of unspecified site: Secondary | ICD-10-CM

## 2014-03-21 MED ORDER — LISINOPRIL 20 MG PO TABS: 20.0000 mg | ORAL_TABLET | Freq: Every day | ORAL | Status: DC

## 2014-03-21 MED ORDER — WARFARIN SODIUM 4 MG PO TABS: 2.0000 mg | ORAL_TABLET | Freq: Every day | ORAL | Status: DC

## 2014-03-21 MED ORDER — DENOSUMAB 120 MG/1.7ML ~~LOC~~ SOLN
120.0000 mg | Freq: Once | SUBCUTANEOUS | Status: AC
  Administered 2014-03-21: 120 mg via SUBCUTANEOUS
  Filled 2014-03-21: qty 1.7

## 2014-03-21 NOTE — Progress Notes (Signed)
VASCULAR LAB PRELIMINARY  PRELIMINARY  PRELIMINARY  PRELIMINARY  Left lower extremity venous duplex completed.    Preliminary report:  Left:  No evidence of DVT or superficial thrombosis. Baker's cyst noted in left popliteal fossa.  Chari Parmenter, RVT 03/21/2014, 12:59 PM

## 2014-03-21 NOTE — Telephone Encounter (Signed)
per pof to sch appt-per pt req to mail-mailed copy of sch-pt headed to [facility]

## 2014-03-21 NOTE — Progress Notes (Signed)
[facility]  [phone] Fax:([phone]) [phone]     ID: [name] OB: February 23, 1946  MR#: [id number]  OEV#:[id number]  PCP: Curlene Labrum, MD GYN:  Gari Crown  SU:  OTHER MD: Gery Pray, Sherre Poot  CHIEF COMPLAINT: stage IV breast cancer CURRENT TREATMENT: letrozole + palbocilcib [name]  BREAST CANCER HISTORY: From Dr. Adria Dill summary 07/14/2013::  "Complex 68 year old woman with history of ER-positive and ER negative metachronous primary bilateral breast cancers.  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 Progression in bone while on tamoxifen September 2011. Initial complete response to monthly Faslodex injections.She developed a malignant left axillary lymph node with no clear primary in the left breast November 2011. This tumor was ER negative. HER-2 negative. While under evaluation for this lesion she developed a new lesion in the right breast which was ER positive. She underwent bilateral mastectomies 06/28/2010. She was continued on hormonal therapy with monthly Faslodex injections in view of the ER positive component of her tumor with a complete response to this hormone in bone. She underwent radiation to the left chest and axilla subsequent to the mastectomies. She developed a cutaneous recurrence on the right chest wall along the medial aspect of the right mastectomy scar, biopsy proven, initially detected on clinical exam by her radiation oncologist on 02/14/2011. She was treated with electron beam radiation.. 3000 cGy in 15 fractions through December 2012. Further progression in bone in August 2013. Palliative radiation to lumbosacral spine September through October 2013. Brief trial of Xeloda November 2013 through January 2014. Change to  tamoxifen plus [employer] February 2014 stopped for progression and 25 pound weight loss November 2014. Trial of Megace 80 mg twice a day with improved appetite but progressive rise in tumor marker and progressive low back pain with further progression on MRI of the spine done January 2015. Radiation to the lumbar spine February 2015. She started most recent salvage regimen with a combination of Ibrance plus Femara on 07/08/2013. It is too early to assess response."  The patient's breast cancer history is summarized in detailed below   INTERVAL HISTORY: Phuong returns today for followup of her stage IV breast cancer. The interval history is significant for her having developed some upper back pain which radiates around both sides (upper chest) about 2 weeks ago. This is still significant. It is not well-controlled on her pain medications. She also noted significant left lower extremity swelling about a week ago. This is new. She has had swelling in the right leg, but not the left previously. Otherwise she is tolerating the letrozole and Palbociclib remarkably well. She is due for her every 3 month denosumab treatment today.  REVIEW OF SYSTEMS: She sleeps poorly. She describes herself is moderately to severely fatigued. She has muscle aches and cramps which are fairly constant and sometimes she has spasms of the whole leg. She keeps a runnyshe has some mild source of times. She has an irregular heartbeat. She has fairly chronic right ankle swelling. She does wear a compression stocking most of the time. She is short of breath when walking up stairs. She keeps a dry cough. Her appetite is poor. She has some heartburn. She denies diarrhea or constipation problems she has. Fairly good urine control. She feels forgetful and anxious but not depressed. Otherwise a detailed review of systems today was stable  PAST MEDICAL HISTORY: Past Medical History  Diagnosis Date  . Cancer     RBreast, Lower back , Under l  Arm  . Hypertension   . GERD (gastroesophageal reflux disease)   . Breast cancer metastasized to bone 04/30/2011  . Radiation 01/15/2012    Lumbosacral spine 3500 cGy 14  fx  . Swollen R ankle 08/10/2012  . Fracture of ankle, medial malleolus, right, closed 08/10/2012  . Tubular adenoma 2011  . Hiatal hernia 2011  . Schatzki's ring   . Anorexia 03/02/2013  . Weight loss due to medication 03/02/2013    PAST SURGICAL HISTORY: Past Surgical History  Procedure Laterality Date  . Gallbladder surgery  1991  . Mastectomy  06/28/10    bilateral- Dr. Rosebud Poles, [place]  . Inguinal hernia repair  90's    right  . Parathyroidectomy  90's  . Carpal tunnel release  90's  . Oophorectomy  90's    right  . Lymph node dissection    . Breast surgery    . Cholecystectomy    . Colonoscopy  01/08/10    Dr. [name]- anal tag,hemorrhoid o/w normal rectum, pancolonic diverticula, diminutive polyp in the base of the cecum= tubular adenoma on bx. poor prep  . Esophagogastroduodenoscopy  01/08/10    Dr. [name]- normal esophagus, small hiatal hernia, antum and body erosions, pedunculated polyp between D1 and D2, duodenal diverticulum, lymphangiectasia, second portion of the duodenum.stomach bx= inflammation, duodenum bx= adenoma  . Rotator cuff repair  08/27/11    Dr. Alphonzo Cruise- [facility]- [place] [facility]  . Esophagogastroduodenoscopy (egd) with propofol N/A 11/19/2012    RMR: non-critical Schatzki's ring s/p dilation with 35 F, multiple gastric polyps, duodenal polyp s/p piecemeal snare polypectomy, gastric polypectomy, path benign  . Maloney dilation N/A 11/19/2012    Procedure: Venia Minks DILATION;  Surgeon: Daneil Dolin, MD;  Location: [facility];  Service: Endoscopy;  Laterality: N/A;  #[id number]  . Polypectomy N/A 11/19/2012    Procedure: POLYPECTOMY;  Surgeon: Daneil Dolin, MD;  Location: [facility];  Service: Endoscopy;  Laterality: N/A;  duodenal  . Esophageal biopsy N/A 11/19/2012    Procedure: BIOPSY;  Surgeon: Daneil Dolin, MD;   Location: [facility];  Service: Endoscopy;  Laterality: N/A;  esophageal    FAMILY HISTORY Family History  Problem Relation Age of Onset  . Cancer Mother     Colon  . Cancer Father     Lung, Mouth,leg   the patient's father died at the age of 20 from metastatic head and neck carcinoma. The patient's mother died of breast cancer the age of 52. The patient had 2 brothers, both with prostate cancer. She has 4 sisters, one diagnosed with breast cancer at the age of 20, another 1 recent underwent bilateral mastectomies. Another one died from colon cancer.   GYNECOLOGIC HISTORY:   menarche age 73, she is GX P0. She underwent menopause at age 25, status post HRT for 12 years. She status post right salpingo-oophorectomy.   SOCIAL HISTORY:  She works at [employer] in [place]. Her husband died at [facility] [place] from complications of surgery after a 7-1/2 month hospitalization) 2008). She lives by herself, with no pets. She tells me she works 9 hours a day 5 days a week.     ADVANCED DIRECTIVES: Not in place   HEALTH MAINTENANCE: History  Substance Use Topics  . Smoking status: Former Smoker    Quit date: 11/22/1982  . Smokeless tobacco: Not on file  Comment: FQuit smoking x 32 years  . Alcohol Use: No     Colonoscopy:  PAP:  Bone density:  Lipid panel:  Allergies  Allergen Reactions  . Aspirin   . Percodan [Oxycodone-Aspirin] Nausea Only  . Codeine Nausea Only    Current Outpatient Prescriptions  Medication Sig Dispense Refill  . cholecalciferol (VITAMIN D) 1000 UNITS tablet Take 2,000 Units by mouth daily.    . diphenoxylate-atropine (LOMOTIL) 2.5-0.025 MG per tablet Take 2 tablets by mouth as needed for diarrhea or loose stools.     . Homeopathic Products (ARNICA) GEL Apply topically every morning. Feet, legs to the knees    . HYDROcodone-acetaminophen (NORCO) 10-325 MG per tablet Take 1-2 tablets by mouth every 4 (four) hours as needed for moderate pain or  severe pain. 360 tablet 0  . letrozole (FEMARA) 2.5 MG tablet Take 1 tablet (2.5 mg total) by mouth daily. 28 tablet 3  . lisinopril (PRINIVIL,ZESTRIL) 20 MG tablet Take 20 mg by mouth daily.     Marland Kitchen loperamide (IMODIUM A-D) 2 MG tablet Take 2 mg by mouth 4 (four) times daily as needed for diarrhea or loose stools.    . mometasone (ELOCON) 0.1 % lotion Apply topically daily as needed. Use as directed 30 mL 5  . nitrofurantoin, macrocrystal-monohydrate, (MACROBID) 100 MG capsule     . ondansetron (ZOFRAN-ODT) 8 MG disintegrating tablet DISSOLVE 1 TABLET IN MOUTH EVERY 8 HOURS AS NEEDED FOR NAUSEA 20 tablet 0  . palbociclib (IBRANCE) 125 MG capsule Take 1 capsule (125 mg total) by mouth daily. Take 21days out of 28 with Letrozole, with food 21 capsule 3  . pantoprazole (PROTONIX) 20 MG tablet TAKE 2 TABLETS BY MOUTH TWICE DAILY 120 tablet 0  . warfarin (COUMADIN) 4 MG tablet Take 2-4 mg by mouth daily. 86m on Mon.,Wed.,Fri., and 474mall other days     No current facility-administered medications for this visit.    OBJECTIVE: Middle-aged white woman who appears stated age Fi37itals:   03/21/14 1048  BP: 145/79  Pulse: 71  Temp: 98.3 F (36.8 C)  Resp: 18     Body mass index is 26.23 kg/(m^2).    ECOG FS:1 - Symptomatic but completely ambulatory  Sclerae unicteric, pupils round and equal Oropharynx clear, no thrush or other lesions seen No cervical or supraclavicular adenopathy Lungs no rales or rhonchi Heart regular rate and rhythm Abd soft, nontender, positive bowel sounds MSK focal spinal tenderness at approximately the T4 level, bilateral ankle swelling, left greater than right. Neuro: nonfocal, well oriented, appropriate affect Breasts: Status post bilateral mastectomies. There is no evidence of local recurrence. Both axillae are benign.    LAB RESULTS: Outside labs reviewed. Most recent CBC showed a white cell count of 2.0, neutrophils 1.3, platelets 276,000, MCV 103, and  hemoglobin 10.9. CA-27-29 was 43.8. Liver function tests were normal.  No components found for: LABCA125  No results for input(s): INR in the last 168 hours.  Urinalysis    Component Value Date/Time   COLORURINE YELLOW 06/28/2010 0856   APPEARANCEUR CLEAR 06/28/2010 0856   LABSPEC 1.024 06/28/2010 0856   PHURINE 6.0 06/28/2010 0856   HGBUR NEGATIVE 06/28/2010 0856   BILIRUBINUR NEGATIVE 06/28/2010 0856   KETONESUR NEGATIVE 06/28/2010 0856   PROTEINUR NEGATIVE 06/28/2010 0856   UROBILINOGEN 0.2 06/28/2010 0856   NITRITE NEGATIVE 06/28/2010 0856   LEUKOCYTESUR  06/28/2010 0856    NEGATIVE MICROSCOPIC NOT DONE ON URINES WITH NEGATIVE PROTEIN, BLOOD, LEUKOCYTES, NITRITE, OR GLUCOSE <  1000 mg/dL.    STUDIES: Nm Pet Image Restag (ps) Skull Base To Thigh  03/14/2014   CLINICAL DATA:  Subsequent treatment strategy for breast cancer. Restaging examination.  EXAM: NUCLEAR MEDICINE PET SKULL BASE TO THIGH  TECHNIQUE: 7.7 mCi F-18 FDG was injected intravenously. Full-ring PET imaging was performed from the skull base to thigh after the radiotracer. CT data was obtained and used for attenuation correction and anatomic localization.  FASTING BLOOD GLUCOSE:  Value: 94 mg/dl  COMPARISON:  PET-CT 12/10/2013.  FINDINGS: NECK  No hypermetabolic lymph nodes in the neck.  CHEST  Status post bilateral modified radical mastectomy and axillary nodal dissection. No focal soft tissue mass along the chest wall to suggest local recurrence of disease, and no axillary lymphadenopathy. Previously noted left internal mammary lymph nodes are no longer enlarged (8 mm), or hypermetabolic (SUVmax = 1.1). Likewise, the previously noted prevascular lymph node is slightly smaller than the prior examination, currently measuring only 11 x 8 mm (image 61 of series 4), and is not hypermetabolic (SUVmax = 1.3). No new hypermetabolic mediastinal or hilar lymphadenopathy noted. No hypermetabolic pulmonary nodules or masses. Extensive  pleuroparenchymal thickening, architectural distortion and chronic volume loss in apices of the lungs bilaterally, presumably some chronic postradiation fibrosis. Similar findings are present to a lesser extent in the subpleural aspect of the anterior right upper and right middle lobes, also presumably from prior radiation therapy.  ABDOMEN/PELVIS  No abnormal hypermetabolic activity within the liver, pancreas, adrenal glands, or spleen. Status post cholecystectomy. Several subcentimeter low attenuation lesions in the left lobe of the liver are unchanged in size, and demonstrate no hypermetabolism, favored to be benign lesions such as small cysts. No hypermetabolic lymph nodes in the abdomen or pelvis. Colonic diverticulosis, particularly in the descending colon and sigmoid colon, without surrounding inflammatory changes to suggest an acute diverticulitis at this time. Atherosclerosis throughout the abdominal and pelvic vasculature, without definite aneurysm.  SKELETON  Multifocal lytic and sclerotic lesions throughout the visualized axial and appendicular skeleton appear similar to prior examinations, compatible with treated breast cancer metastasis. None of these demonstrate definitive hypermetabolism on today's examination, although there is some low-level metabolic activity within the lytic lesion in the lateral aspect of the right ilium (SUVmax = 2.0).  IMPRESSION: 1. Today's study demonstrates further positive response to therapy with slight decrease in size and resolution of hypermetabolism in the left internal mammary and prevascular lymph nodes. No new evidence of additional nodal disease or definite metastatic disease in the neck, chest, abdomen or pelvis, other than the previously described treated osseous metastases. 2. Colonic diverticulosis without findings to suggest acute diverticulitis at this time. 3. Additional incidental findings, as above.   Electronically Signed   By: Vinnie Langton M.D.    On: 03/14/2014 16:02      ASSESSMENT: 68 y.o. [name], [place] woman status post right lumpectomy and axillary lymph node dissection December 2006 for a T N2, stage IIIA invasive ductal carcinoma, estrogen and progesterone receptor positive, with subsequent stage IV disease  (1) received adjuvant doxorubicin, docetaxel and cyclophosphamide  (2) status post adjuvant radiation  (3) on tamoxifen to September 2011 when she was noted to have metastatic disease to bone  (4) fulvestrant 02/06/2009 to 01/15/2011  (5) left axillary lymph node positive for an invasive ductal carcinoma, estrogen receptor and HER-2 negative November 2011, and a right breast recurrence for an estrogen receptor positive lesion at the same time.  (6) status post bilateral mastectomies 06/28/2010  (7) status  post left chest and axillary radiation  (8) status post right chest wall recurrence October of 2012, treated with electron beam radiation completed December 2012  (9) progression in bone August 2013,  (10) status post radiation to the lumbosacral spine completed October 2013   (11) capecitabine November 2013 through January 2014  (12) tamoxifen/ everolimus February 2014, discontinued November 2014 with progression  (13) Megace 80 mg twice daily with progression January 2015  (14) Radiation to lumbar spine February 2015  (15) letrozole plus Ibrance started 07/08/2013  (16). Declines genetic testing (as of 08/17/2013)  (17) zolendronate 05/01/2009, discontinued because of pain; Xgeva started 10/08/2013, to be repeated every 3 months  OTHER ACTIVE PROBLEMS:  (1) chronic GI complaints, followed by Dr. Sydell Axon  (2) multiple orthopedic problems, including bilateral chronic foot pain, status post rotator cuff repair  (3) history of extensive superficial phlebitis, on chronic low dose Coumadin.  PLAN: Alka is tolerating the letrozole and Palbociclib well area did for some reason had her insurance company is  refusing to pay for the monthly Palbociclib labs (they paid for the every 2 week previously). She understands we really need to have these labs to make sure she is not developing cytopenias. I have asked one of our financial advisors to help appeal this.  She is having focal back pain around the T4 area. Of course she has significant tumor in her spine, but she also has a lot of degenerative disease. It may be that she has a compression fracture or disc. I am obtaining plain films of her upper back today. She understands if that is the case, possibly kyphoplasty might be of help.  In addition, we are obtaining a Doppler ultrasound of the left lower extremity, just to make sure the swelling there is not related to a clot.  We reviewed the PET results, which are very favorable. She will see me again in 3 months. We will do a chest x-ray and lab work the same day as that visit. We will repeat a PET scan 6 months from now.  Ahniya has a good understanding of the overall plan. She agrees with it. She knows the goal of treatment in her case is control. She will call with any problems that may develop before her next visit here.   Chauncey Cruel, MD   03/21/2014 11:04 AM

## 2014-03-23 ENCOUNTER — Ambulatory Visit: Payer: Medicare PPO | Admitting: Oncology

## 2014-04-05 ENCOUNTER — Other Ambulatory Visit: Payer: Self-pay | Admitting: Emergency Medicine

## 2014-04-05 DIAGNOSIS — C7951 Secondary malignant neoplasm of bone: Secondary | ICD-10-CM

## 2014-04-05 DIAGNOSIS — C50919 Malignant neoplasm of unspecified site of unspecified female breast: Secondary | ICD-10-CM

## 2014-04-05 DIAGNOSIS — C7952 Secondary malignant neoplasm of bone marrow: Secondary | ICD-10-CM

## 2014-04-05 MED ORDER — LETROZOLE 2.5 MG PO TABS: 2.5000 mg | ORAL_TABLET | Freq: Every day | ORAL | Status: DC

## 2014-04-05 MED ORDER — PALBOCICLIB 125 MG PO CAPS: 125.0000 mg | ORAL_CAPSULE | Freq: Every day | ORAL | Status: DC

## 2014-04-14 ENCOUNTER — Other Ambulatory Visit: Payer: Self-pay | Admitting: *Deleted

## 2014-04-14 DIAGNOSIS — C7951 Secondary malignant neoplasm of bone: Principal | ICD-10-CM

## 2014-04-14 DIAGNOSIS — C50919 Malignant neoplasm of unspecified site of unspecified female breast: Secondary | ICD-10-CM

## 2014-04-14 MED ORDER — HYDROCODONE-ACETAMINOPHEN 10-325 MG PO TABS: 1.0000 | ORAL_TABLET | ORAL | Status: DC | PRN

## 2014-04-19 ENCOUNTER — Encounter: Payer: Self-pay | Admitting: Internal Medicine

## 2014-04-19 ENCOUNTER — Ambulatory Visit (INDEPENDENT_AMBULATORY_CARE_PROVIDER_SITE_OTHER): Payer: Medicare PPO | Admitting: Internal Medicine

## 2014-04-19 VITALS — BP 138/69 | HR 42 | Temp 97.9°F | Ht 62.5 in | Wt 147.2 lb

## 2014-04-19 DIAGNOSIS — K219 Gastro-esophageal reflux disease without esophagitis: Secondary | ICD-10-CM

## 2014-04-19 MED ORDER — SUCRALFATE 1 GM/10ML PO SUSP: 1.0000 g | Freq: Three times a day (TID) | ORAL | Status: DC

## 2014-04-19 NOTE — Patient Instructions (Signed)
No more Protonix  Try Carafate suspension before meals at bedtime (do not take within 2 hours of taking your chemotherapy medication in the evenings)  GERD information  Office follow-up in 3 months (let me know how the Carafate is working in the next 1-2 weeks)

## 2014-04-19 NOTE — Progress Notes (Signed)
Primary Care Physician:  Curlene Labrum, MD Primary Gastroenterologist:  Dr. [name]  Pre-Procedure History & Physical: HPI:  [name] is a 68 y.o. female here for followup of metastatic breast cancer and GERD. She was taking Protonix 20 mg at bedtime, however, she started having severe leg cramps with her current chemotherapeutic regimen and consequently, stopped it. She has nocturnal epigastric pain regularly.  She takes Nurse, adult.  She's lost 5 pounds since she was last seen. Last PET scan was encouraging with recession of tumor noted. She continues to work.  Past Medical History  Diagnosis Date  . Cancer     RBreast, Lower back , Under l Arm  . Hypertension   . GERD (gastroesophageal reflux disease)   . Breast cancer metastasized to bone 04/30/2011  . Radiation 01/15/2012    Lumbosacral spine 3500 cGy 14  fx  . Swollen R ankle 08/10/2012  . Fracture of ankle, medial malleolus, right, closed 08/10/2012  . Tubular adenoma 2011  . Hiatal hernia 2011  . Schatzki's ring   . Anorexia 03/02/2013  . Weight loss due to medication 03/02/2013    Past Surgical History  Procedure Laterality Date  . Gallbladder surgery  1991  . Mastectomy  06/28/10    bilateral- Dr. Rosebud Poles, [place]  . Inguinal hernia repair  90's    right  . Parathyroidectomy  90's  . Carpal tunnel release  90's  . Oophorectomy  90's    right  . Lymph node dissection    . Breast surgery    . Cholecystectomy    . Colonoscopy  01/08/10    Dr. [name]- anal tag,hemorrhoid o/w normal rectum, pancolonic diverticula, diminutive polyp in the base of the cecum= tubular adenoma on bx. poor prep  . Esophagogastroduodenoscopy  01/08/10    Dr. [name]- normal esophagus, small hiatal hernia, antum and body erosions, pedunculated polyp between D1 and D2, duodenal diverticulum, lymphangiectasia, second portion of the duodenum.stomach bx= inflammation, duodenum bx= adenoma  . Rotator cuff repair  08/27/11    Dr. Alphonzo Cruise- [facility]-  [facility]  . Esophagogastroduodenoscopy (egd) with propofol N/A 11/19/2012    RMR: non-critical Schatzki's ring s/p dilation with 72 F, multiple gastric polyps, duodenal polyp s/p piecemeal snare polypectomy, gastric polypectomy, path benign  . Maloney dilation N/A 11/19/2012    Procedure: Venia Minks DILATION;  Surgeon: Daneil Dolin, MD;  Location: [facility];  Service: Endoscopy;  Laterality: N/A;  #[id number]  . Polypectomy N/A 11/19/2012    Procedure: POLYPECTOMY;  Surgeon: Daneil Dolin, MD;  Location: [facility];  Service: Endoscopy;  Laterality: N/A;  duodenal  . Esophageal biopsy N/A 11/19/2012    Procedure: BIOPSY;  Surgeon: Daneil Dolin, MD;  Location: [facility];  Service: Endoscopy;  Laterality: N/A;  esophageal    Prior to Admission medications   Medication Sig Start Date End Date Taking? Authorizing Provider  Homeopathic Products (ARNICA) GEL Apply topically every morning. Feet, legs to the knees   Yes Historical Provider, MD  HYDROcodone-acetaminophen (NORCO) 10-325 MG per tablet Take 1-2 tablets by mouth every 4 (four) hours as needed for moderate pain or severe pain. 04/14/14  Yes Chauncey Cruel, MD  letrozole [facility]) 2.5 MG tablet Take 1 tablet (2.5 mg total) by mouth daily. 04/05/14  Yes Chauncey Cruel, MD  lisinopril (PRINIVIL,ZESTRIL) 20 MG tablet Take 1 tablet (20 mg total) by mouth daily. 03/21/14  Yes Chauncey Cruel, MD  loperamide (IMODIUM A-D) 2 MG tablet Take 2 mg by  mouth 4 (four) times daily as needed for diarrhea or loose stools.   Yes Historical Provider, MD  mometasone (ELOCON) 0.1 % lotion Apply topically daily as needed. Use as directed 06/29/12  Yes Annia Belt, MD  ondansetron (ZOFRAN-ODT) 8 MG disintegrating tablet DISSOLVE 1 TABLET IN MOUTH EVERY 8 HOURS AS NEEDED FOR NAUSEA   Yes Chauncey Cruel, MD  palbociclib [facility]) 125 MG capsule Take 1 capsule (125 mg total) by mouth daily. Take 21days out of 28 with Letrozole, with food 04/05/14  Yes Chauncey Cruel, MD    warfarin (COUMADIN) 4 MG tablet Take 0.5-1 tablets (2-4 mg total) by mouth daily. 2mg  on Mon.,Wed.,Fri., and 4mg  all other days 03/21/14  Yes Chauncey Cruel, MD  cholecalciferol (VITAMIN D) 1000 UNITS tablet Take 2,000 Units by mouth daily.    Historical Provider, MD  diphenoxylate-atropine (LOMOTIL) 2.5-0.025 MG per tablet Take 2 tablets by mouth as needed for diarrhea or loose stools.  01/23/12   Historical Provider, MD  nitrofurantoin, macrocrystal-monohydrate, (MACROBID) 100 MG capsule  07/16/13   Historical Provider, MD  pantoprazole (PROTONIX) 20 MG tablet TAKE 2 TABLETS BY MOUTH TWICE DAILY Patient not taking: Reported on 04/19/2014 12/09/13   Chauncey Cruel, MD    Allergies as of 04/19/2014 - Review Complete 04/19/2014  Allergen Reaction Noted  . Aspirin  11/13/2012  . Percodan [oxycodone-aspirin] Nausea Only   . Codeine Nausea Only     Family History  Problem Relation Age of Onset  . Cancer Mother     Colon  . Cancer Father     Lung, Mouth,leg    History   Social History  . Marital Status: Widowed    Spouse Name: N/A    Number of Children: N/A  . Years of Education: N/A   Occupational History  . Not on file.   Social History Main Topics  . Smoking status: Former Smoker    Quit date: 11/22/1982  . Smokeless tobacco: Not on file     Comment: FQuit smoking x 32 years  . Alcohol Use: No  . Drug Use: Not on file  . Sexual Activity: Not on file   Other Topics Concern  . Not on file   Social History Narrative    Review of Systems: See HPI, otherwise negative ROS  Physical Exam: BP 138/69 mmHg  Pulse 42  Temp(Src) 97.9 F (36.6 C)  Ht 5' 2.5" (1.588 m)  Wt 147 lb 3.2 oz (66.769 kg)  BMI 26.48 kg/m2 General:   Alert,   pleasant and cooperative in NAD Skin:  Intact without significant lesions or rashes. Eyes:  Sclera clear, no icterus.   Conjunctiva pink. Ears:  Normal auditory acuity. Nose:  No deformity, discharge,  or lesions. Mouth:  No  deformity or lesions. Neck:  Supple; no masses or thyromegaly. No significant cervical adenopathy. Lungs:  Clear throughout to auscultation.   No wheezes, crackles, or rhonchi. No acute distress. Heart:  Regular rate and rhythm; no murmurs, clicks, rubs,  or gallops. Abdomen: nondistended. Positive bowel sounds. She has some epigastric tenderness to palpation. No appreciable mass or organomegaly. Pulses:  Normal pulses noted. Extremities:  Without clubbing or edema.  Impression:  68 year old lady with metastatic breast cancer and GERD. Having breakthrough reflux symptoms with cessation of proton pump inhibitor therapy. Side effects of leg cramps may be an idiosyncratic reaction in the setting of current chemotherapy regimen. It may also be a class effect. Its unusual.  To minimize drug interactions,  I feel we have should try a course of Carafate suspension to see if we get her symptoms under control with minimization of side effects.  Recommendations:  No more Protonix.  Try Carafate suspension before meals at bedtime (do not take within 2 hours of taking your chemotherapy medication in the evenings). GERD information.   Office follow-up in 3 months (let me know how the Carafate is working in the next 1-2 weeks)    Notice: This dictation was prepared with Dragon dictation along with smaller phrase technology. Any transcriptional errors that result from this process are unintentional and may not be corrected upon review.

## 2014-04-26 ENCOUNTER — Other Ambulatory Visit: Payer: Self-pay | Admitting: Oncology

## 2014-05-01 ENCOUNTER — Other Ambulatory Visit: Payer: Self-pay | Admitting: Oncology

## 2014-05-01 DIAGNOSIS — C50919 Malignant neoplasm of unspecified site of unspecified female breast: Secondary | ICD-10-CM

## 2014-05-01 DIAGNOSIS — C7951 Secondary malignant neoplasm of bone: Principal | ICD-10-CM

## 2014-05-01 MED ORDER — HYDROCODONE-ACETAMINOPHEN 10-325 MG PO TABS: 1.0000 | ORAL_TABLET | ORAL | Status: DC | PRN

## 2014-05-02 ENCOUNTER — Telehealth: Payer: Self-pay

## 2014-05-02 NOTE — Telephone Encounter (Signed)
Pt called- she tried the carafate qid as directed and she said that morning around 4am she woke up with severe pain that radiated up into her shoulder and down her arm. She said it felt like an MI but she didn't go to the ED. She said she spent the rest of the morning in the bathroom, heaving. She said she hasnt taken anymore and will not take anymore and just wanted to let RMR know.

## 2014-05-04 NOTE — Telephone Encounter (Signed)
Sounds like it could be cardiac pain versus reflux.. Likely nothing to do with Carafate. I recommend ED evaluation to sort out.  . I feel episode totally unrelated to Carafate.

## 2014-05-04 NOTE — Telephone Encounter (Signed)
Tried to call pt- NA 

## 2014-05-05 NOTE — Telephone Encounter (Signed)
I spoke with the pt. She said she went to her pcp the next day and had her heart checked and he told her everything looked good and the pcp could not find anything wrong. She still believes it was the carafate and said she wouldn't take anymore of it.

## 2014-05-05 NOTE — Telephone Encounter (Signed)
Tried to call pt- NA 

## 2014-05-23 ENCOUNTER — Other Ambulatory Visit: Payer: Self-pay | Admitting: *Deleted

## 2014-05-24 ENCOUNTER — Other Ambulatory Visit: Payer: Self-pay | Admitting: *Deleted

## 2014-05-24 DIAGNOSIS — C7951 Secondary malignant neoplasm of bone: Principal | ICD-10-CM

## 2014-05-24 DIAGNOSIS — C50919 Malignant neoplasm of unspecified site of unspecified female breast: Secondary | ICD-10-CM

## 2014-05-24 MED ORDER — HYDROCODONE-ACETAMINOPHEN 10-325 MG PO TABS: 1.0000 | ORAL_TABLET | ORAL | Status: DC | PRN

## 2014-05-27 NOTE — Telephone Encounter (Signed)
Patient called stating she Vicodin prescription was suppose to be mailed 05/24/14, but did not receive it. Information will be forwarded to MD/nurse. Patient verbalized understanding.

## 2014-06-17 NOTE — Telephone Encounter (Signed)
none

## 2014-06-19 ENCOUNTER — Other Ambulatory Visit: Payer: Self-pay | Admitting: Oncology

## 2014-06-20 ENCOUNTER — Ambulatory Visit: Payer: Medicare PPO | Admitting: Oncology

## 2014-06-20 ENCOUNTER — Other Ambulatory Visit: Payer: Medicare PPO

## 2014-06-21 ENCOUNTER — Ambulatory Visit (HOSPITAL_BASED_OUTPATIENT_CLINIC_OR_DEPARTMENT_OTHER): Payer: Medicare PPO | Admitting: Oncology

## 2014-06-21 ENCOUNTER — Telehealth: Payer: Self-pay | Admitting: Oncology

## 2014-06-21 ENCOUNTER — Other Ambulatory Visit (HOSPITAL_BASED_OUTPATIENT_CLINIC_OR_DEPARTMENT_OTHER): Payer: Medicare PPO

## 2014-06-21 VITALS — BP 124/60 | HR 79 | Temp 98.9°F | Ht 62.5 in | Wt 148.2 lb

## 2014-06-21 DIAGNOSIS — C50511 Malignant neoplasm of lower-outer quadrant of right female breast: Secondary | ICD-10-CM

## 2014-06-21 DIAGNOSIS — M159 Polyosteoarthritis, unspecified: Secondary | ICD-10-CM

## 2014-06-21 DIAGNOSIS — C7951 Secondary malignant neoplasm of bone: Secondary | ICD-10-CM

## 2014-06-21 DIAGNOSIS — I809 Phlebitis and thrombophlebitis of unspecified site: Secondary | ICD-10-CM

## 2014-06-21 DIAGNOSIS — C773 Secondary and unspecified malignant neoplasm of axilla and upper limb lymph nodes: Secondary | ICD-10-CM

## 2014-06-21 DIAGNOSIS — Z170 Estrogen receptor positive status [ER+]: Secondary | ICD-10-CM

## 2014-06-21 DIAGNOSIS — M150 Primary generalized (osteo)arthritis: Secondary | ICD-10-CM

## 2014-06-21 DIAGNOSIS — Z8672 Personal history of thrombophlebitis: Secondary | ICD-10-CM

## 2014-06-21 DIAGNOSIS — C50919 Malignant neoplasm of unspecified site of unspecified female breast: Secondary | ICD-10-CM

## 2014-06-21 LAB — COMPREHENSIVE METABOLIC PANEL (CC13)
ALK PHOS: 63 U/L (ref 40–150)
ALT: 10 U/L (ref 0–55)
AST: 18 U/L (ref 5–34)
Albumin: 3.7 g/dL (ref 3.5–5.0)
Anion Gap: 8 mEq/L (ref 3–11)
BUN: 15.2 mg/dL (ref 7.0–26.0)
CO2: 28 mEq/L (ref 22–29)
Calcium: 9.2 mg/dL (ref 8.4–10.4)
Chloride: 107 mEq/L (ref 98–109)
Creatinine: 0.7 mg/dL (ref 0.6–1.1)
EGFR: 82 mL/min/{1.73_m2} — ABNORMAL LOW (ref >90)
Glucose: 120 mg/dl (ref 70–140)
POTASSIUM: 4.1 meq/L (ref 3.5–5.1)
Sodium: 143 mEq/L (ref 136–145)
Total Bilirubin: 0.52 mg/dL (ref 0.20–1.20)
Total Protein: 6.6 g/dL (ref 6.4–8.3)

## 2014-06-21 LAB — CBC WITH DIFFERENTIAL/PLATELET
BASO%: 1.6 % (ref 0.0–2.0)
Basophils Absolute: 0.1 10*3/uL (ref 0.0–0.1)
EOS%: 0.8 % (ref 0.0–7.0)
Eosinophils Absolute: 0 10*3/uL (ref 0.0–0.5)
HCT: 38.8 % (ref 34.8–46.6)
HGB: 13.3 g/dL (ref 11.6–15.9)
LYMPH#: 0.3 10*3/uL — AB (ref 0.9–3.3)
LYMPH%: 8.4 % — AB (ref 14.0–49.7)
MCH: 35.5 pg — AB (ref 25.1–34.0)
MCHC: 34.3 g/dL (ref 31.5–36.0)
MCV: 103.5 fL — ABNORMAL HIGH (ref 79.5–101.0)
MONO#: 0.3 10*3/uL (ref 0.1–0.9)
MONO%: 6.8 % (ref 0.0–14.0)
NEUT#: 3.1 10*3/uL (ref 1.5–6.5)
NEUT%: 82.4 % — AB (ref 38.4–76.8)
PLATELETS: 302 10*3/uL (ref 145–400)
RBC: 3.75 10*6/uL (ref 3.70–5.45)
RDW: 13.5 % (ref 11.2–14.5)
WBC: 3.8 10*3/uL — ABNORMAL LOW (ref 3.9–10.3)

## 2014-06-21 LAB — PROTIME-INR
INR: 1.9 — ABNORMAL LOW (ref 2.00–3.50)
Protime: 22.8 Seconds — ABNORMAL HIGH (ref 10.6–13.4)

## 2014-06-21 MED ORDER — LISINOPRIL 20 MG PO TABS: 20.0000 mg | ORAL_TABLET | Freq: Every day | ORAL | Status: DC

## 2014-06-21 MED ORDER — WARFARIN SODIUM 4 MG PO TABS: 2.0000 mg | ORAL_TABLET | Freq: Every day | ORAL | Status: DC

## 2014-06-21 MED ORDER — HYDROCODONE-ACETAMINOPHEN 10-325 MG PO TABS: 1.0000 | ORAL_TABLET | ORAL | Status: DC | PRN

## 2014-06-21 MED ORDER — ALENDRONATE SODIUM 70 MG PO TABS: 70.0000 mg | ORAL_TABLET | ORAL | Status: DC

## 2014-06-21 NOTE — Telephone Encounter (Signed)
, °

## 2014-06-21 NOTE — Progress Notes (Signed)
[place]  Telephone:([phone] Fax:(336) [phone]     ID: [name] OB: 1945/12/06  MR#: [id number]  RKY#:[id number]  PCP: Curlene Labrum, MD GYN:  Gari Crown  SU:  OTHER MD: Gery Pray, Sherre Poot  CHIEF COMPLAINT: stage IV breast cancer CURRENT TREATMENT: letrozole + palbocilcib [name]  BREAST CANCER HISTORY: From Dr. Adria Dill summary 07/14/2013::  "Complex 69 year old [name] with history of ER-positive and ER negative metachronous primary bilateral breast cancers.  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 Progression in bone while on tamoxifen September 2011. Initial complete response to monthly Faslodex injections.She developed a malignant left axillary lymph node with no clear primary in the left breast November 2011. This tumor was ER negative. HER-2 negative. While under evaluation for this lesion she developed a Carolyn lesion in the right breast which was ER positive. She underwent bilateral mastectomies 06/28/2010. She was continued on hormonal therapy with monthly Faslodex injections in view of the ER positive component of her tumor with a complete response to this hormone in bone. She underwent radiation to the left chest and axilla subsequent to the mastectomies. She developed a cutaneous recurrence on the right chest wall along the medial aspect of the right mastectomy scar, biopsy proven, initially detected on clinical exam by her radiation oncologist on 02/14/2011. She was treated with electron beam radiation.. 3000 cGy in 15 fractions through December 2012. Further progression in bone in August 2013. Palliative radiation to lumbosacral spine September through October 2013. Brief trial of Xeloda November 2013 through January 2014. Change to  tamoxifen plus [employer] February 2014 stopped for progression and 25 pound weight loss November 2014. Trial of Megace 80 mg twice a day with improved appetite but progressive rise in tumor marker and progressive low back pain with further progression on MRI of the spine done January 2015. Radiation to the lumbar spine February 2015. She started most recent salvage regimen with a combination of Ibrance plus Femara on 07/08/2013. It is too early to assess response."  The patient's breast cancer history is summarized in detailed below   INTERVAL HISTORY: [name] returns today for followup of her stage IV breast cancer. 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 She herself has been at this job for 31 years.--. She feels "terrible". Particularly her legs and feet hurt all the time and she is having pain in her left big toe, which is Carolyn. She complains of chills, night sweats, weight loss (but recently has gained some) ringing in her ears sinus problems epistaxis, disease difficulty swallowing mouth sores palpitations ankle swelling shortness of breath particularly when walking up stairs, nausea, heartburn, alternating diarrhea and constipation, urinary incontinence, urinary infections, continuing back pain, anxiety, forgetfulness, and numbness.  REVIEW OF SYSTEMS: She is taking letrozole and Palbociclib with no side effects that she is aware of. She is obtaining both of them currently at no cost thinks to Dr. Azucena Freed intervention. Her pain is controlled on Norco one or 2 tablets every 4 hours. This does not particularly constipate her. 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 We continue to check her lab work  on a monthly basis. She has had significant issues contacting us  because of the Carolyn phone system. Aside from these issues a detailed review of systems was stable.  PAST MEDICAL HISTORY: Past Medical History  Diagnosis Date  . Cancer     RBreast, Lower back , Under l Arm  . Hypertension   . GERD (gastroesophageal reflux disease)   . Breast cancer metastasized to bone 04/30/2011  . Radiation 01/15/2012    Lumbosacral spine 3500 cGy 14  fx  . Swollen R ankle 08/10/2012  . Fracture of ankle, medial malleolus, right, closed 08/10/2012  . Tubular adenoma 2011  . Hiatal hernia 2011  . Schatzki's ring   . Anorexia 03/02/2013  . Weight loss due to medication 03/02/2013    PAST SURGICAL HISTORY: Past Surgical History  Procedure Laterality Date  . Gallbladder surgery  1991  . Mastectomy  06/28/10    bilateral- Dr. Rosebud Poles, [place]  . Inguinal hernia repair  90's    right  . Parathyroidectomy  90's  . Carpal tunnel release  90's  . Oophorectomy  90's    right  . Lymph node dissection    . Breast surgery    . Cholecystectomy    . Colonoscopy  01/08/10    Dr. [name]- anal tag,hemorrhoid o/w normal rectum, pancolonic diverticula, diminutive polyp in the base of the cecum= tubular adenoma on bx. poor prep  . Esophagogastroduodenoscopy  01/08/10    Dr. [name]- normal esophagus, small hiatal hernia, antum and body erosions, pedunculated polyp between D1 and D2, duodenal diverticulum, lymphangiectasia, second portion of the duodenum.stomach bx= inflammation, duodenum bx= adenoma  . Rotator cuff repair  08/27/11    Dr. Alphonzo Cruise- [facility]- [facility]  . Esophagogastroduodenoscopy (egd) with propofol N/A 11/19/2012    RMR: non-critical Schatzki's ring s/p dilation with 57 F, multiple gastric polyps, duodenal polyp s/p piecemeal snare polypectomy, gastric polypectomy, path benign  . Maloney dilation N/A 11/19/2012    Procedure: Venia Minks DILATION;  Surgeon: Daneil Dolin, MD;  Location: [facility];  Service: Endoscopy;  Laterality: N/A;  #54  . Polypectomy N/A 11/19/2012     Procedure: POLYPECTOMY;  Surgeon: Daneil Dolin, MD;  Location: [facility];  Service: Endoscopy;  Laterality: N/A;  duodenal  . Esophageal biopsy N/A 11/19/2012    Procedure: BIOPSY;  Surgeon: Daneil Dolin, MD;  Location: [facility];  Service: Endoscopy;  Laterality: N/A;  esophageal    FAMILY HISTORY Family History  Problem Relation Age of Onset  . Cancer Mother     Colon  . Cancer Father     Lung, Mouth,leg   the patient's father died at the age of 79 from metastatic head and neck carcinoma. The patient's mother died of breast cancer the age of 61. The patient had 2 brothers, both with prostate cancer. She has 4 sisters, one diagnosed with breast cancer at the age of 21, another 1 recent underwent bilateral mastectomies. Another one died from colon cancer.   GYNECOLOGIC HISTORY:   menarche age 74, she is GX P0. She underwent menopause at age 17, status post HRT for 12 years. She status post right salpingo-oophorectomy.   SOCIAL HISTORY:  She works at [employer] in [place]. Her husband died at [employer] [place] from complications of surgery after a 7-1/2 month hospitalization) 2008). She lives by herself, with no pets. She tells me she works 9 hours a day 5 days a week.     ADVANCED DIRECTIVES: Not in place  HEALTH MAINTENANCE: History  Substance Use Topics  . Smoking status: Former Smoker    Quit date: 11/22/1982  . Smokeless tobacco: Not on file     Comment: FQuit smoking x 32 years  . Alcohol Use: No     Colonoscopy:  PAP:  Bone density:  Lipid panel:  Allergies  Allergen Reactions  . Aspirin   . Percodan [Oxycodone-Aspirin] Nausea Only  . Codeine Nausea Only    Current Outpatient Prescriptions  Medication Sig Dispense Refill  . cholecalciferol (VITAMIN D) 1000 UNITS tablet Take 2,000 Units by mouth daily.    . diphenoxylate-atropine (LOMOTIL) 2.5-0.025 MG per tablet Take 2 tablets by mouth as needed for diarrhea or loose stools.     . Homeopathic  Products (ARNICA) GEL Apply topically every morning. Feet, legs to the knees    . HYDROcodone-acetaminophen (NORCO) 10-325 MG per tablet Take 1-2 tablets by mouth every 4 (four) hours as needed for moderate pain or severe pain. 360 tablet 0  . letrozole (FEMARA) 2.5 MG tablet Take 1 tablet (2.5 mg total) by mouth daily. 28 tablet 3  . lisinopril (PRINIVIL,ZESTRIL) 20 MG tablet Take 1 tablet (20 mg total) by mouth daily. 90 tablet 4  . loperamide (IMODIUM A-D) 2 MG tablet Take 2 mg by mouth 4 (four) times daily as needed for diarrhea or loose stools.    . mometasone (ELOCON) 0.1 % lotion Apply topically daily as needed. Use as directed 30 mL 5  . nitrofurantoin, macrocrystal-monohydrate, (MACROBID) 100 MG capsule     . ondansetron (ZOFRAN-ODT) 8 MG disintegrating tablet DISSOLVE 1 TABLET BY MOUTH EVERY 8 HOURS AS NEEDED FOR NAUSEA 20 tablet 0  . palbociclib (IBRANCE) 125 MG capsule Take 1 capsule (125 mg total) by mouth daily. Take 21days out of 28 with Letrozole, with food 21 capsule 3  . pantoprazole (PROTONIX) 20 MG tablet TAKE 2 TABLETS BY MOUTH TWICE DAILY (Patient not taking: Reported on 04/19/2014) 120 tablet 0  . sucralfate (CARAFATE) 1 GM/10ML suspension Take 10 mLs (1 g total) by mouth 4 (four) times daily -  with meals and at bedtime. Do not take within 2 hours of the chemo medications. 420 mL 0  . warfarin (COUMADIN) 4 MG tablet Take 0.5-1 tablets (2-4 mg total) by mouth daily. 66m on Mon.,Wed.,Fri., and 416mall other days 90 tablet 4   No current facility-administered medications for this visit.    OBJECTIVE: Middle-aged white [name] in no acute distress Filed Vitals:   06/21/14 1156  BP: 124/60  Temp: 98.9 F (37.2 C)     Body mass index is 26.66 kg/(m^2).    ECOG FS:1 - Symptomatic but completely ambulatory Filed Vitals:   06/21/14 1156  BP: 124/60  Pulse: 79  Temp: 98.9 F (37.2 C)    Sclerae unicteric, pupils equal and reactive Oropharynx clear and moist-- no thrush or  other lesions No cervical or supraclavicular adenopathy Lungs no rales or rhonchi Heart regular rate and rhythm Abd soft, nontender, positive bowel sounds MSK no focal spinal tenderness to moderate palpation, no upper extremity lymphedema Neuro: nonfocal, well oriented, frustrated affect Breasts: Deferred. She status post bilateral mastectomies. Both axillae are benign.  LAB RESULTS: Outside labs reviewed.No components found for: LABCA125 CBC    Component Value Date/Time   WBC 3.8* 06/21/2014 1124   WBC 8.8 06/27/2010 1558   RBC 3.75 06/21/2014 1124   RBC 4.52 06/27/2010 1558   HGB 13.3 06/21/2014 1124   HGB 13.6 06/27/2010 1558  HCT 38.8 06/21/2014 1124   HCT 40.6 06/27/2010 1558   PLT 302 06/21/2014 1124   PLT 272 06/27/2010 1558   MCV 103.5* 06/21/2014 1124   MCV 89.8 06/27/2010 1558   MCH 35.5* 06/21/2014 1124   MCH 30.1 06/27/2010 1558   MCHC 34.3 06/21/2014 1124   MCHC 33.5 06/27/2010 1558   RDW 13.5 06/21/2014 1124   RDW 13.0 06/27/2010 1558   LYMPHSABS 0.3* 06/21/2014 1124   LYMPHSABS 1.0 06/27/2010 1558   MONOABS 0.3 06/21/2014 1124   MONOABS 1.0 06/27/2010 1558   EOSABS 0.0 06/21/2014 1124   EOSABS 0.2 06/27/2010 1558   BASOSABS 0.1 06/21/2014 1124   BASOSABS 0.0 06/27/2010 1558      Recent Labs Lab 06/21/14 1124  INR 1.90*    Urinalysis    Component Value Date/Time   COLORURINE YELLOW 06/28/2010 0856   APPEARANCEUR CLEAR 06/28/2010 0856   LABSPEC 1.024 06/28/2010 0856   PHURINE 6.0 06/28/2010 0856   HGBUR NEGATIVE 06/28/2010 0856   BILIRUBINUR NEGATIVE 06/28/2010 0856   KETONESUR NEGATIVE 06/28/2010 0856   PROTEINUR NEGATIVE 06/28/2010 0856   UROBILINOGEN 0.2 06/28/2010 0856   NITRITE NEGATIVE 06/28/2010 0856   LEUKOCYTESUR  06/28/2010 0856    NEGATIVE MICROSCOPIC NOT DONE ON URINES WITH NEGATIVE PROTEIN, BLOOD, LEUKOCYTES, NITRITE, OR GLUCOSE <1000 mg/dL.    STUDIES: No results found.  ASSESSMENT: 69 y.o. [name], [name] status post  right lumpectomy and axillary lymph node dissection December 2006 for a [place] N2, stage IIIA invasive ductal carcinoma, estrogen and progesterone receptor positive, with subsequent stage IV disease  (1) received adjuvant doxorubicin, docetaxel and cyclophosphamide  (2) status post adjuvant radiation  (3) on tamoxifen to September 2011 when she was noted to have metastatic disease to bone  (4) fulvestrant 02/06/2009 to 01/15/2011  (5) left axillary lymph node positive for an invasive ductal carcinoma, estrogen receptor and HER-2 negative November 2011, and a right breast recurrence for an estrogen receptor positive lesion at the same time.  (6) status post bilateral mastectomies 06/28/2010  (7) status post left chest and axillary radiation  (8) status post right chest wall recurrence October of 2012, treated with electron beam radiation completed December 2012  (9) progression in bone August 2013,  (10) status post radiation to the lumbosacral spine completed October 2013   (11) capecitabine November 2013 through January 2014  (12) tamoxifen/ everolimus February 2014, discontinued November 2014 with progression  (13) Megace 80 mg twice daily with progression January 2015  (14) Radiation to lumbar spine February 2015  (15) letrozole plus Ibrance started 07/08/2013  (16). Declines genetic testing (as of 08/17/2013)  (17) zolendronate 05/01/2009, discontinued because of pain; Xgeva started 10/08/2013, discontinued because of cost issues as of November 2015, switched to alendronate March 2016  OTHER ACTIVE PROBLEMS:  (1) chronic GI complaints, followed by Dr. Sydell Axon  (2) multiple orthopedic problems, including bilateral chronic foot pain, status post rotator cuff repair  (3) history of extensive superficial phlebitis, on chronic low dose Coumadin.  PLAN: [name] is very stable clinically and is very excited that her CA-27-29 has been dropping, although she understands that I do  not place to much face on that. Certainly it is better they're dropping than they've might be rising!  She was billed for the last denosumab treatment it possibly it was not entered correctly. At any rate she does not want anymore denosumab. [name] am switching her to alendronate. [name] wrote the prescription for her and [name] gave her written  instructions on how to take the medication. She will be on that every week. If she has any problems with that she will let me know.  In the meantime she is obtaining the Palbociclib and letrozole at no cost. She will continue on those as before. She will see me again in 3 months and before that visit we will repeat a PET scan.  Today [name] refilled her Norco, as well as her lisinopril and Coumadin. She is very frustrated with our Carolyn triage phone system. [name] gave her my nurses direct number for future reference.  [name] has a good understanding of the overall plan. She agrees with it. She knows the goal of treatment in her case is control. She will call with any problems that may develop before her next visit here.   Chauncey Cruel, MD   06/21/2014 11:59 AM

## 2014-06-22 LAB — CANCER ANTIGEN 27.29: CA 27.29: 42 U/mL — AB (ref 0–39)

## 2014-06-24 ENCOUNTER — Other Ambulatory Visit: Payer: Self-pay | Admitting: Emergency Medicine

## 2014-06-30 ENCOUNTER — Encounter: Payer: Self-pay | Admitting: Internal Medicine

## 2014-07-04 ENCOUNTER — Other Ambulatory Visit: Payer: Self-pay | Admitting: *Deleted

## 2014-07-04 ENCOUNTER — Encounter: Payer: Self-pay | Admitting: Oncology

## 2014-07-04 DIAGNOSIS — C7951 Secondary malignant neoplasm of bone: Secondary | ICD-10-CM

## 2014-07-04 DIAGNOSIS — C50919 Malignant neoplasm of unspecified site of unspecified female breast: Secondary | ICD-10-CM

## 2014-07-04 DIAGNOSIS — C7952 Secondary malignant neoplasm of bone marrow: Secondary | ICD-10-CM

## 2014-07-04 MED ORDER — PALBOCICLIB 125 MG PO CAPS: 125.0000 mg | ORAL_CAPSULE | Freq: Every day | ORAL | Status: DC

## 2014-07-04 MED ORDER — LETROZOLE 2.5 MG PO TABS: 2.5000 mg | ORAL_TABLET | Freq: Every day | ORAL | Status: DC

## 2014-07-04 NOTE — Progress Notes (Signed)
Faxed refill request to [phone]  ibrance and letrozole

## 2014-07-05 ENCOUNTER — Other Ambulatory Visit: Payer: Self-pay | Admitting: *Deleted

## 2014-07-05 DIAGNOSIS — C7951 Secondary malignant neoplasm of bone: Principal | ICD-10-CM

## 2014-07-05 DIAGNOSIS — C50919 Malignant neoplasm of unspecified site of unspecified female breast: Secondary | ICD-10-CM

## 2014-07-05 MED ORDER — HYDROCODONE-ACETAMINOPHEN 10-325 MG PO TABS: 1.0000 | ORAL_TABLET | ORAL | Status: DC | PRN

## 2014-07-29 ENCOUNTER — Other Ambulatory Visit: Payer: Self-pay | Admitting: *Deleted

## 2014-07-29 MED ORDER — PALBOCICLIB 125 MG PO CAPS: 125.0000 mg | ORAL_CAPSULE | Freq: Every day | ORAL | Status: DC

## 2014-08-02 ENCOUNTER — Other Ambulatory Visit: Payer: Self-pay | Admitting: *Deleted

## 2014-08-02 DIAGNOSIS — C7952 Secondary malignant neoplasm of bone marrow: Secondary | ICD-10-CM

## 2014-08-02 DIAGNOSIS — C7951 Secondary malignant neoplasm of bone: Secondary | ICD-10-CM

## 2014-08-02 DIAGNOSIS — C50919 Malignant neoplasm of unspecified site of unspecified female breast: Secondary | ICD-10-CM

## 2014-08-02 MED ORDER — LETROZOLE 2.5 MG PO TABS: 2.5000 mg | ORAL_TABLET | Freq: Every day | ORAL | Status: DC

## 2014-08-02 MED ORDER — PALBOCICLIB 125 MG PO CAPS: 125.0000 mg | ORAL_CAPSULE | Freq: Every day | ORAL | Status: DC

## 2014-08-08 ENCOUNTER — Other Ambulatory Visit: Payer: Self-pay | Admitting: Oncology

## 2014-08-09 ENCOUNTER — Encounter (HOSPITAL_COMMUNITY): Payer: Self-pay | Admitting: General Practice

## 2014-08-09 ENCOUNTER — Inpatient Hospital Stay (HOSPITAL_COMMUNITY)
Admission: AD | Admit: 2014-08-09 | Discharge: 2014-08-11 | DRG: 177 | Disposition: A | Discharge status: Home / Self Care | Payer: Medicare PPO | Source: Other Acute Inpatient Hospital | Attending: Internal Medicine | Admitting: Internal Medicine

## 2014-08-09 DIAGNOSIS — Z9013 Acquired absence of bilateral breasts and nipples: Secondary | ICD-10-CM | POA: Diagnosis present

## 2014-08-09 DIAGNOSIS — C50919 Malignant neoplasm of unspecified site of unspecified female breast: Secondary | ICD-10-CM

## 2014-08-09 DIAGNOSIS — I10 Essential (primary) hypertension: Secondary | ICD-10-CM | POA: Diagnosis present

## 2014-08-09 DIAGNOSIS — C50511 Malignant neoplasm of lower-outer quadrant of right female breast: Secondary | ICD-10-CM | POA: Diagnosis not present

## 2014-08-09 DIAGNOSIS — Z7901 Long term (current) use of anticoagulants: Secondary | ICD-10-CM

## 2014-08-09 DIAGNOSIS — R918 Other nonspecific abnormal finding of lung field: Secondary | ICD-10-CM | POA: Diagnosis present

## 2014-08-09 DIAGNOSIS — J984 Other disorders of lung: Secondary | ICD-10-CM | POA: Diagnosis present

## 2014-08-09 DIAGNOSIS — Z9221 Personal history of antineoplastic chemotherapy: Secondary | ICD-10-CM | POA: Diagnosis not present

## 2014-08-09 DIAGNOSIS — J189 Pneumonia, unspecified organism: Secondary | ICD-10-CM | POA: Diagnosis present

## 2014-08-09 DIAGNOSIS — C773 Secondary and unspecified malignant neoplasm of axilla and upper limb lymph nodes: Secondary | ICD-10-CM

## 2014-08-09 DIAGNOSIS — J15212 Pneumonia due to Methicillin resistant Staphylococcus aureus: Secondary | ICD-10-CM | POA: Diagnosis present

## 2014-08-09 DIAGNOSIS — R0602 Shortness of breath: Secondary | ICD-10-CM | POA: Diagnosis present

## 2014-08-09 DIAGNOSIS — Z87891 Personal history of nicotine dependence: Secondary | ICD-10-CM | POA: Diagnosis not present

## 2014-08-09 DIAGNOSIS — Z79899 Other long term (current) drug therapy: Secondary | ICD-10-CM

## 2014-08-09 DIAGNOSIS — D381 Neoplasm of uncertain behavior of trachea, bronchus and lung: Secondary | ICD-10-CM | POA: Diagnosis not present

## 2014-08-09 DIAGNOSIS — Z923 Personal history of irradiation: Secondary | ICD-10-CM

## 2014-08-09 DIAGNOSIS — Z170 Estrogen receptor positive status [ER+]: Secondary | ICD-10-CM | POA: Diagnosis not present

## 2014-08-09 DIAGNOSIS — J851 Abscess of lung with pneumonia: Secondary | ICD-10-CM | POA: Diagnosis present

## 2014-08-09 DIAGNOSIS — C50911 Malignant neoplasm of unspecified site of right female breast: Secondary | ICD-10-CM | POA: Diagnosis present

## 2014-08-09 DIAGNOSIS — C7951 Secondary malignant neoplasm of bone: Secondary | ICD-10-CM

## 2014-08-09 DIAGNOSIS — Y95 Nosocomial condition: Secondary | ICD-10-CM | POA: Diagnosis present

## 2014-08-09 DIAGNOSIS — G8929 Other chronic pain: Secondary | ICD-10-CM | POA: Diagnosis present

## 2014-08-09 DIAGNOSIS — R110 Nausea: Secondary | ICD-10-CM | POA: Diagnosis present

## 2014-08-09 DIAGNOSIS — Z86718 Personal history of other venous thrombosis and embolism: Secondary | ICD-10-CM

## 2014-08-09 DIAGNOSIS — R222 Localized swelling, mass and lump, trunk: Secondary | ICD-10-CM

## 2014-08-09 DIAGNOSIS — R071 Chest pain on breathing: Secondary | ICD-10-CM

## 2014-08-09 DIAGNOSIS — K219 Gastro-esophageal reflux disease without esophagitis: Secondary | ICD-10-CM | POA: Diagnosis present

## 2014-08-09 DIAGNOSIS — IMO0002 Reserved for concepts with insufficient information to code with codable children: Secondary | ICD-10-CM

## 2014-08-09 DIAGNOSIS — I890 Lymphedema, not elsewhere classified: Secondary | ICD-10-CM

## 2014-08-09 DIAGNOSIS — R229 Localized swelling, mass and lump, unspecified: Secondary | ICD-10-CM

## 2014-08-09 LAB — CBC
HCT: 32.2 % — ABNORMAL LOW (ref 36.0–46.0)
HEMOGLOBIN: 10.7 g/dL — AB (ref 12.0–15.0)
MCH: 34 pg (ref 26.0–34.0)
MCHC: 33.2 g/dL (ref 30.0–36.0)
MCV: 102.2 fL — ABNORMAL HIGH (ref 78.0–100.0)
PLATELETS: 294 10*3/uL (ref 150–400)
RBC: 3.15 MIL/uL — AB (ref 3.87–5.11)
RDW: 13.6 % (ref 11.5–15.5)
WBC: 7 10*3/uL (ref 4.0–10.5)

## 2014-08-09 LAB — COMPREHENSIVE METABOLIC PANEL
ALBUMIN: 2.5 g/dL — AB (ref 3.5–5.2)
ALT: 39 U/L — ABNORMAL HIGH (ref 0–35)
ANION GAP: 8 (ref 5–15)
AST: 29 U/L (ref 0–37)
Alkaline Phosphatase: 67 U/L (ref 39–117)
BUN: <5 mg/dL — ABNORMAL LOW (ref 6–23)
CHLORIDE: 101 mmol/L (ref 96–112)
CO2: 29 mmol/L (ref 19–32)
Calcium: 8.7 mg/dL (ref 8.4–10.5)
Creatinine, Ser: 0.56 mg/dL (ref 0.50–1.10)
GFR calc non Af Amer: >90 mL/min (ref >90)
Glucose, Bld: 95 mg/dL (ref 70–99)
Potassium: 4.3 mmol/L (ref 3.5–5.1)
Sodium: 138 mmol/L (ref 135–145)
Total Bilirubin: 0.4 mg/dL (ref 0.3–1.2)
Total Protein: 5.8 g/dL — ABNORMAL LOW (ref 6.0–8.3)

## 2014-08-09 MED ORDER — SODIUM CHLORIDE 0.9 % IJ SOLN: 3.0000 mL | INTRAMUSCULAR | Status: DC | PRN

## 2014-08-09 MED ORDER — VANCOMYCIN HCL IN DEXTROSE 750-5 MG/150ML-% IV SOLN
750.0000 mg | Freq: Two times a day (BID) | INTRAVENOUS | Status: DC
  Administered 2014-08-09 – 2014-08-11 (×4): 750 mg via INTRAVENOUS
  Filled 2014-08-09 (×5): qty 150

## 2014-08-09 MED ORDER — HEPARIN (PORCINE) IN NACL 100-0.45 UNIT/ML-% IJ SOLN
1000.0000 [IU]/h | INTRAMUSCULAR | Status: DC
  Administered 2014-08-09: 750 [IU]/h via INTRAVENOUS
  Filled 2014-08-09 (×2): qty 250

## 2014-08-09 MED ORDER — SODIUM CHLORIDE 0.9 % IJ SOLN
3.0000 mL | Freq: Two times a day (BID) | INTRAMUSCULAR | Status: DC
  Administered 2014-08-09 – 2014-08-11 (×3): 3 mL via INTRAVENOUS

## 2014-08-09 MED ORDER — POLYETHYLENE GLYCOL 3350 17 G PO PACK: 17.0000 g | PACK | Freq: Every day | ORAL | Status: DC | PRN

## 2014-08-09 MED ORDER — SODIUM CHLORIDE 0.9 % IV SOLN: 250.0000 mL | INTRAVENOUS | Status: DC | PRN

## 2014-08-09 MED ORDER — SODIUM CHLORIDE 0.9 % IJ SOLN
3.0000 mL | Freq: Two times a day (BID) | INTRAMUSCULAR | Status: DC
  Administered 2014-08-10: 3 mL via INTRAVENOUS

## 2014-08-09 MED ORDER — HYDROMORPHONE HCL 1 MG/ML IJ SOLN
1.0000 mg | INTRAMUSCULAR | Status: DC | PRN
  Administered 2014-08-09 – 2014-08-11 (×10): 1 mg via INTRAVENOUS
  Filled 2014-08-09 (×10): qty 1

## 2014-08-09 MED ORDER — ACETAMINOPHEN 325 MG PO TABS: 650.0000 mg | ORAL_TABLET | Freq: Four times a day (QID) | ORAL | Status: DC | PRN

## 2014-08-09 MED ORDER — ONDANSETRON HCL 4 MG/2ML IJ SOLN
4.0000 mg | Freq: Four times a day (QID) | INTRAMUSCULAR | Status: DC | PRN
  Administered 2014-08-09 – 2014-08-11 (×5): 4 mg via INTRAVENOUS
  Filled 2014-08-09 (×5): qty 2

## 2014-08-09 MED ORDER — HYDROCODONE-ACETAMINOPHEN 5-325 MG PO TABS
1.0000 | ORAL_TABLET | ORAL | Status: DC | PRN
  Administered 2014-08-10 – 2014-08-11 (×3): 1 via ORAL
  Filled 2014-08-09 (×3): qty 1

## 2014-08-09 MED ORDER — LOPERAMIDE HCL 2 MG PO CAPS: 2.0000 mg | ORAL_CAPSULE | ORAL | Status: DC | PRN

## 2014-08-09 MED ORDER — DEXTROMETHORPHAN POLISTIREX 30 MG/5ML PO LQCR
30.0000 mg | Freq: Two times a day (BID) | ORAL | Status: DC | PRN
  Filled 2014-08-09: qty 5

## 2014-08-09 MED ORDER — PIPERACILLIN-TAZOBACTAM 3.375 G IVPB
3.3750 g | Freq: Three times a day (TID) | INTRAVENOUS | Status: DC
  Administered 2014-08-09 – 2014-08-11 (×6): 3.375 g via INTRAVENOUS
  Filled 2014-08-09 (×8): qty 50

## 2014-08-09 MED ORDER — PANTOPRAZOLE SODIUM 40 MG PO TBEC
40.0000 mg | DELAYED_RELEASE_TABLET | Freq: Every day | ORAL | Status: DC
  Administered 2014-08-09 – 2014-08-11 (×3): 40 mg via ORAL
  Filled 2014-08-09 (×3): qty 1

## 2014-08-09 MED ORDER — LORAZEPAM 0.5 MG PO TABS: 0.5000 mg | ORAL_TABLET | Freq: Two times a day (BID) | ORAL | Status: DC | PRN

## 2014-08-09 MED ORDER — DIPHENHYDRAMINE HCL 25 MG PO CAPS: 25.0000 mg | ORAL_CAPSULE | Freq: Four times a day (QID) | ORAL | Status: DC | PRN

## 2014-08-09 MED ORDER — ALUM & MAG HYDROXIDE-SIMETH 200-200-20 MG/5ML PO SUSP: 30.0000 mL | Freq: Four times a day (QID) | ORAL | Status: DC | PRN

## 2014-08-09 NOTE — Consult Note (Signed)
[place]  [phone] [phone]     ID: NIKIYAH FACKLER DOB: 1945-05-23  MR#: [id number]  TXM#:[id number]  Patient Care Team: Curlene Labrum, MD as PCP - General Gery Pray, MD (Radiation Oncology) Daneil Dolin, MD (Gastroenterology) Annia Belt, MD (Hematology and Oncology) Chauncey Cruel, MD as Consulting Physician (Oncology) PCP: Curlene Labrum, MD GYN: Gari Crown MD SU:  OTHER MD:  CHIEF COMPLAINT: stage IV breast cancer  CURRENT TREATMENT: letrozole, palbociclib [name]   BREAST CANCER HISTORY: From Dr. Adria Dill summary 07/14/2013::  "Complex 69 year old woman with history of ER-positive and ER negative metachronous primary bilateral breast cancers.  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 Progression in bone while on tamoxifen September 2011. Initial complete response to monthly Faslodex injections.She developed a malignant left axillary lymph node with no clear primary in the left breast November 2011. This tumor was ER negative. HER-2 negative. While under evaluation for this lesion she developed a new lesion in the right breast which was ER positive. She underwent bilateral mastectomies 06/28/2010. She was continued on hormonal therapy with monthly Faslodex injections in view of the ER positive component of her tumor with a complete response to this hormone in bone. She underwent radiation to the left chest and axilla subsequent to the mastectomies. She developed a cutaneous recurrence on the right chest wall along the medial aspect of the right mastectomy scar, biopsy proven, initially detected on clinical exam by her radiation oncologist on 02/14/2011. She was treated with electron beam radiation.. 3000 cGy in 15 fractions through December  2012. Further progression in bone in August 2013. Palliative radiation to lumbosacral spine September through October 2013. Brief trial of Xeloda November 2013 through January 2014. Change to tamoxifen plus [employer] February 2014 stopped for progression and 25 pound weight loss November 2014. Trial of Megace 80 mg twice a day with improved appetite but progressive rise in tumor marker and progressive low back pain with further progression on MRI of the spine done January 2015. Radiation to the lumbar spine February 2015. She started most recent salvage regimen with a combination of Ibrance plus Femara on 07/08/2013. It is too early to assess response."  The patient's subsequent breast cancer history is summarized in detailed below   INTERVAL HISTORY: I was called 08/08/2014 by a locums MD from [facility] in [place] requesting information on [place]. When I called back however the MD told me she was "off" and did not remember the patient. We contacted the hospitalist on call at [facility] and learned the patient had been admitted with CAP about a week before. A sputum sample was + for MRSA. She was started on antibiotics and improved but around 08/05/2014 developed a new and severe pain involving the left axilla and upper left back, worse with inspiration.  A CT scan was obtained and reportedly showed a cavitary lesion in the LUL. The patient was to have been transferred to [facility] yesterday for further workup and possible thoracic SU intervention, but for some reason was only trasnferred today. Bebe is very disappointed with the care she received at [facility]. I met with her in her room after transfer the evening of 08/09/2014  REVIEW OF SYSTEMS: Her breathing is a bit better but she still has L sided pain. She originally had a cough but no phlegm, with a T  about 102. Currently she denies cough. Also no significant h/a, visual changes or neck stiffness. Had N/V/D initially but that is "much better,  about gone" now. Her anticoagulation was held at [facility] but is being resumed here. A detailed ROS tonight was otherwise stable  PAST MEDICAL HISTORY: Past Medical History  Diagnosis Date  . Cancer     RBreast, Lower back , Under l Arm  . Hypertension   . GERD (gastroesophageal reflux disease)   . Breast cancer metastasized to bone 04/30/2011  . Radiation 01/15/2012    Lumbosacral spine 3500 cGy 14  fx  . Swollen R ankle 08/10/2012  . Fracture of ankle, medial malleolus, right, closed 08/10/2012  . Tubular adenoma 2011  . Hiatal hernia 2011  . Schatzki's ring   . Anorexia 03/02/2013  . Weight loss due to medication 03/02/2013    PAST SURGICAL HISTORY: Past Surgical History  Procedure Laterality Date  . Gallbladder surgery  1991  . Mastectomy  06/28/10    bilateral- Dr. Rosebud Poles, [place]  . Inguinal hernia repair  90's    right  . Parathyroidectomy  90's  . Carpal tunnel release  90's  . Oophorectomy  90's    right  . Lymph node dissection    . Breast surgery    . Cholecystectomy    . Colonoscopy  01/08/10    Dr. [name]- anal tag,hemorrhoid o/w normal rectum, pancolonic diverticula, diminutive polyp in the base of the cecum= tubular adenoma on bx. poor prep  . Esophagogastroduodenoscopy  01/08/10    Dr. [name]- normal esophagus, small hiatal hernia, antum and body erosions, pedunculated polyp between D1 and D2, duodenal diverticulum, lymphangiectasia, second portion of the duodenum.stomach bx= inflammation, duodenum bx= adenoma  . Rotator cuff repair  08/27/11    Dr. Alphonzo Cruise- [facility]- [place] [facility]  . Esophagogastroduodenoscopy (egd) with propofol N/A 11/19/2012    RMR: non-critical Schatzki's ring s/p dilation with 26 F, multiple gastric polyps, duodenal polyp s/p piecemeal snare polypectomy, gastric polypectomy, path benign  . Maloney dilation N/A 11/19/2012    Procedure: Venia Minks DILATION;  Surgeon: Daneil Dolin, MD;  Location: [facility];  Service: Endoscopy;  Laterality: N/A;  #[id number]  .  Polypectomy N/A 11/19/2012    Procedure: POLYPECTOMY;  Surgeon: Daneil Dolin, MD;  Location: [facility];  Service: Endoscopy;  Laterality: N/A;  duodenal  . Esophageal biopsy N/A 11/19/2012    Procedure: BIOPSY;  Surgeon: Daneil Dolin, MD;  Location: [facility];  Service: Endoscopy;  Laterality: N/A;  esophageal    FAMILY HISTORY Family History  Problem Relation Age of Onset  . Cancer Mother     Colon  . Cancer Father     Lung, Mouth,leg  the patient's father died at the age of 14 from metastatic head and neck carcinoma. The patient's mother died of breast cancer the age of 61. The patient had 2 brothers, both with prostate cancer. She has 4 sisters, one diagnosed with breast cancer at the age of 64, another 1 recent underwent bilateral mastectomies. Another one died from colon cancer.   GYNECOLOGIC HISTORY:  No LMP recorded. Patient is postmenopausal. menarche age 83, she is GX P0. She underwent menopause at age 69, status post HRT for 12 years. She status post right salpingo-oophorectomy.   SOCIAL HISTORY:  She works at [employer] in [place]. Her husband died at [facility] from complications of surgery after a 7-1/2 month hospitalization (2008). She lives by herself, with no pets. She tells me  she works 9 hours a day 5 days a week.     ADVANCED DIRECTIVES: not in place   HEALTH MAINTENANCE: History  Substance Use Topics  . Smoking status: Former Smoker    Quit date: 11/22/1982  . Smokeless tobacco: Never Used     Comment: FQuit smoking x 32 years  . Alcohol Use: No     Allergies  Allergen Reactions  . Aspirin   . Percodan [Oxycodone-Aspirin] Nausea Only  . Codeine Nausea Only    Current Facility-Administered Medications  Medication Dose Route Frequency Provider Last Rate Last Dose  . 0.9 %  sodium chloride infusion  250 mL Intravenous PRN Melton Alar, PA-C      . acetaminophen (TYLENOL) tablet 650 mg  650 mg Oral Q6H PRN Melton Alar, PA-C        . alum & mag hydroxide-simeth (MAALOX/MYLANTA) 200-200-20 MG/5ML suspension 30 mL  30 mL Oral Q6H PRN Melton Alar, PA-C      . dextromethorphan (DELSYM) 30 MG/5ML liquid 30 mg  30 mg Oral BID PRN Melton Alar, PA-C      . diphenhydrAMINE (BENADRYL) capsule 25 mg  25 mg Oral Q6H PRN Melton Alar, PA-C      . heparin ADULT infusion 100 units/mL (25000 units/250 mL)  750 Units/hr Intravenous Continuous Thuy D Dang, RPH      . HYDROcodone-acetaminophen (NORCO/VICODIN) 5-325 MG per tablet 1 tablet  1 tablet Oral Q4H PRN Melton Alar, PA-C      . HYDROmorphone (DILAUDID) injection 1 mg  1 mg Intravenous Q4H PRN Melton Alar, PA-C   1 mg at 08/09/14 1438  . loperamide (IMODIUM) capsule 2 mg  2 mg Oral PRN Melton Alar, PA-C      . LORazepam (ATIVAN) tablet 0.5 mg  0.5 mg Oral BID PRN Melton Alar, PA-C      . ondansetron [facility]) injection 4 mg  4 mg Intravenous Q6H PRN Melton Alar, PA-C      . pantoprazole (PROTONIX) EC tablet 40 mg  40 mg Oral Daily Melton Alar, PA-C   40 mg at 08/09/14 1438  . piperacillin-tazobactam (ZOSYN) IVPB 3.375 g  3.375 g Intravenous Q8H Thuy D Dang, RPH      . polyethylene glycol (MIRALAX / GLYCOLAX) packet 17 g  17 g Oral Daily PRN Melton Alar, PA-C      . sodium chloride 0.9 % injection 3 mL  3 mL Intravenous Q12H Marianne L York, PA-C   3 mL at 08/09/14 1438  . sodium chloride 0.9 % injection 3 mL  3 mL Intravenous Q12H Marianne L York, PA-C   3 mL at 08/09/14 1438  . sodium chloride 0.9 % injection 3 mL  3 mL Intravenous PRN Melton Alar, PA-C      . vancomycin (VANCOCIN) IVPB 750 mg/150 ml premix  750 mg Intravenous Q12H Thuy D Dang, RPH        OBJECTIVE: middle aged White woman examined in bed Filed Vitals:   08/09/14 1238  BP: 125/57  Pulse: 69  Temp: 98.7 F (37.1 C)  Resp: 16     Body mass index is 24.69 kg/(m^2).    ECOG FS:2 - Symptomatic, <50% confined to bed  Ocular: Sclerae unicteric, EOMs intact Ear-nose-throat:  Oropharynx shows no thrush or other lesions Lymphatic: No cervical or supraclavicular adenopathy Lungs coarse rales left side, no wheezes Heart regular rate and rhythm Abd soft, nontender, positive bowel  sounds Neuro: non-focal, well-oriented, frustrated affect Breasts: deferrred   LAB RESULTS:  CMP     Component Value Date/Time   NA 143 06/21/2014 1124   NA 143 06/15/2013 0958   K 4.1 06/21/2014 1124   K 3.5 06/15/2013 0958   CL 108 06/15/2013 0958   CL 110* 09/02/2012 0814   CO2 28 06/21/2014 1124   CO2 26 06/15/2013 0958   GLUCOSE 120 06/21/2014 1124   GLUCOSE 87 06/15/2013 0958   GLUCOSE 105* 09/02/2012 0814   BUN 15.2 06/21/2014 1124   BUN 14 06/15/2013 0958   CREATININE 0.7 06/21/2014 1124   CREATININE 0.67 06/15/2013 0958   CALCIUM 9.2 06/21/2014 1124   CALCIUM 8.9 06/15/2013 0958   PROT 6.6 06/21/2014 1124   PROT 6.7 06/15/2013 0958   ALBUMIN 3.7 06/21/2014 1124   ALBUMIN 4.0 06/15/2013 0958   AST 18 06/21/2014 1124   AST 16 06/15/2013 0958   ALT 10 06/21/2014 1124   ALT 10 06/15/2013 0958   ALKPHOS 63 06/21/2014 1124   ALKPHOS 58 06/15/2013 0958   BILITOT 0.52 06/21/2014 1124   BILITOT 0.4 06/15/2013 0958   GFRNONAA >60 06/27/2010 1558   GFRAA  06/27/2010 1558    >60        The eGFR has been calculated using the MDRD equation. This calculation has not been validated in all clinical situations. eGFR's persistently <60 mL/min signify possible Chronic Kidney Disease.    INo results found for: SPEP, UPEP  Lab Results  Component Value Date   WBC 7.0 08/09/2014   NEUTROABS 3.1 06/21/2014   HGB 10.7* 08/09/2014   HCT 32.2* 08/09/2014   MCV 102.2* 08/09/2014   PLT 294 08/09/2014    @LASTCHEMISTRY @  Lab Results  Component Value Date   LABCA2 42* 06/21/2014    No components found for: [id number]  No results for input(s): INR in the last 168 hours.  Urinalysis    Component Value Date/Time   COLORURINE YELLOW 06/28/2010 0856   APPEARANCEUR  CLEAR 06/28/2010 0856   LABSPEC 1.024 06/28/2010 0856   PHURINE 6.0 06/28/2010 0856   HGBUR NEGATIVE 06/28/2010 0856   BILIRUBINUR NEGATIVE 06/28/2010 0856   KETONESUR NEGATIVE 06/28/2010 0856   PROTEINUR NEGATIVE 06/28/2010 0856   UROBILINOGEN 0.2 06/28/2010 0856   NITRITE NEGATIVE 06/28/2010 0856   LEUKOCYTESUR  06/28/2010 0856    NEGATIVE MICROSCOPIC NOT DONE ON URINES WITH NEGATIVE PROTEIN, BLOOD, LEUKOCYTES, NITRITE, OR GLUCOSE <1000 mg/dL.    STUDIES: No results found.   ASSESSMENT: 69 y.o. [place], [place] woman status post right lumpectomy and axillary lymph node dissection December 2006 for a TX N2, stage IIIA invasive ductal carcinoma, estrogen and progesterone receptor positive, with subsequent stage IV disease  (1) received adjuvant doxorubicin, docetaxel and cyclophosphamide  (2) status post adjuvant radiation  (3) on tamoxifen to September 2011 when she was noted to have metastatic disease to bone  (4) fulvestrant 02/06/2009 to 01/15/2011  (5) left axillary lymph node positive for an invasive ductal carcinoma, estrogen receptor and HER-2 negative November 2011, and a right breast recurrence for an estrogen receptor positive lesion at the same time.  (6) status post bilateral mastectomies 06/28/2010  (7) status post left chest and axillary radiation  (8) status post right chest wall recurrence October of 2012, treated with electron beam radiation completed December 2012  (9) progression in bone August 2013,  (10) status post radiation to the lumbosacral spine completed October 2013   (11) capecitabine November 2013 through January 2014  (  12) tamoxifen/ everolimus February 2014, discontinued November 2014 with progression  (13) Megace 80 mg twice daily with progression January 2015  (14) Radiation to lumbar spine February 2015  (15) letrozole plus Ibrance started 07/08/2013  (16). Declines genetic testing (as of 08/17/2013)  (17) zolendronate started  05/01/2009, discontinued because of pain; Xgeva started 10/08/2013, discontinued because of cost issues as of November 2015, switched to alendronate March 2016  OTHER ACTIVE PROBLEMS:  (1) chronic GI complaints, followed by Dr. Sydell Axon  (2) multiple orthopedic problems, including bilateral chronic foot pain, status post rotator cuff repair  (3) history of extensive superficial phlebitis, on chronic low dose Coumadin.  PLAN: Per Ewa Gentry description, Machaela has a cavitary LUL mass. CXR here November 2015 showed no LUL mass. I do not have access to the [place] CT images. I have ordered a CXR to compare with prior.  We may be dealing with a necrotizing pneumonia, metastatic disease from breast cancer (not usually cavitary), or a new primary. We will certainly need tissue--whether needle biopsy through IR or (if that fails) open lung biopsy. If we are dealing with a lung primary (patient has a remote smoking history) resection may be an option.   Of note: her breast cancer has been relatively well controlled with disease chiefly involving bones and some nodes, but no primary visceral involvement (per PET scan November 2015). Accordingly if we are dealing with a second primary it may be appropriate to proceed aggressively.  Greatly appreciate your help to this patient. I will follow with you  Chauncey Cruel, MD   08/09/2014 3:02 PM Medical Oncology and Hematology [facility] [address] [place], [place] [address] Tel. [phone]    Fax. [phone]   call

## 2014-08-09 NOTE — Progress Notes (Signed)
ANTIBIOTIC & ANTICOAGULATION CONSULT NOTE - INITIAL  Pharmacy Consult:  Vancomycin / Zosyn + Heparin Indication:  MRSA PNA / HCAP + history of DVT  Allergies  Allergen Reactions  . Aspirin   . Percodan [Oxycodone-Aspirin] Nausea Only  . Codeine Nausea Only    Patient Measurements: Height: 5\' 2"  (157.5 cm) Weight: 135 lb (61.236 kg) IBW/kg (Calculated) : 50.1  Heparin dosing weight = 61 kg  Vital Signs: Temp: 98.7 F (37.1 C) (04/12 1238) BP: 125/57 mmHg (04/12 1238) Pulse Rate: 69 (04/12 1238)  Labs: No results for input(s): WBC, HGB, PLT, LABCREA, CREATININE in the last 72 hours. CrCl cannot be calculated (Patient has no serum creatinine result on file.). No results for input(s): VANCOTROUGH, VANCOPEAK, VANCORANDOM, GENTTROUGH, GENTPEAK, GENTRANDOM, TOBRATROUGH, TOBRAPEAK, TOBRARND, AMIKACINPEAK, AMIKACINTROU, AMIKACIN in the last 72 hours.   Microbiology: No results found for this or any previous visit (from the past 720 hour(s)).  Medical History: Past Medical History  Diagnosis Date  . Cancer     RBreast, Lower back , Under l Arm  . Hypertension   . GERD (gastroesophageal reflux disease)   . Breast cancer metastasized to bone 04/30/2011  . Radiation 01/15/2012    Lumbosacral spine 3500 cGy 14  fx  . Swollen R ankle 08/10/2012  . Fracture of ankle, medial malleolus, right, closed 08/10/2012  . Tubular adenoma 2011  . Hiatal hernia 2011  . Schatzki's ring   . Anorexia 03/02/2013  . Weight loss due to medication 03/02/2013      Assessment: 68 YOF presented to [facility] on 07/[facility]14 with chest pain during deep inspiration.  Found to have MRSA PNA and LUL mass; therefore; patient transfers to [facility] today 08/09/14 for further work-up.  Patient was started on Zosyn and vancomycin at [facility] and to continue here.  She received her first vancomycin dose of 1gm IV at 0900 and her last Zosyn dose was at 0930 today.  Patient renal function has been stable at [facility]  Vanc 4/12  PTA >> Zosyn 4/8 PTA >> Azith 4/3 >> 4/8 CTX 4/3 >> 4/8  4/10 sputum cx from [facility] - MRSA 4/10 BCx x2 at [facility] - negative   Patient also has a history of DVT on Coumadin and VTE prophylactic dose of Lovenox at [place].  Her INR from [facility] is slightly sub-therapeutic at 1.9.  Patient to transition to IV heparin bridge for possible procedure(s) here.  CBC stable at [facility].   Goal of Therapy:  Vancomycin trough level 15-20 mcg/ml  HL 0.3 - 0.7 units/mL Monitor platelets   Plan:  - Vanc 750mg  IV Q12H - Zosyn 3.375gm IV Q8H, 4 hr infusion.  Recommend discontinuing or by 4/15 after completing course of therapy. - Monitor renal fxn, clinical progress, vanc trough at [facility] - Heparin gtt at 750 units/hr, no bolus as INR is 1.9 - Check 8 hr HL - Daily HL / CBC - PT / INR in AM to ensure heparin is not infusing while INR is therapeutic (last Coumadin dose was 4/11 and there is a chance INR may continue to trend up)    Haruka Kowaleski D. Mina Marble, PharmD, BCPS Pager:  [phone] 08/09/2014, 2:22 PM

## 2014-08-09 NOTE — H&P (Signed)
[facility] History and Physical                                                                                    Valley Ke, is a 69 y.o. female  MRN: [id number]   DOB - 11-Feb-1946  Admit Date - 08/09/2014  Outpatient Primary MD for the patient is Curlene Labrum, MD  Oncology:  Dr. Darnell Level. Magrinat.    Chief Complaint:  MRSA PNA with LUL mass.   HPI  Neliah Cuyler  is a 69 y.o. female, with a PMH of metastatic breast cancer, DVT, HTN, Goiter, and PUD with esophageal stricture who was transferred from [facility] today with MRSA PNA for consideration of a new LUL mass.  Ms. Aarons reports that 9 days ago she was feeling poorly and had sudden onset of N/V/D.  Her friend took her to [facility].  She was diagnosed with MRSA PNA (Sputum culture).  Ms. Schnoebelen felt as though she was not getting better.  Approximately 5 days ago she developed chest pain with deep inspiration.  The pain radiated from her left axilla area down her left side.  Over the next couple of days it also began to radiate across her chest to the right.  A CT scan was done at [facility] that showed a new LUL mass.  She discussed this with her oncologist and then requested transfer to [facility] for further evaluation by Cardio-Thoracic Surgery.  In the mean time, her diarrhea slowed and she had a normal bowel movement today.  Normally she states she has to force herself to eat, but with this recent illness she stays hungry and become very nauseated if she does not eat.  She expresses concern because she is normally on coumadin (DVT) but it was held at [place].  She states she started receiving Lovenox injections two days ago.  Review of Systems   In addition to the HPI above,  No Headache, No changes with Vision or hearing, No problems swallowing food or Liquids, No further Abdominal pain, No Nausea or Vomiting, Bowel movements are regular, No Blood in stool or Urine, No dysuria, No new skin rashes or bruises, No  new joints pains-aches,  ++ She described cold painful lower extremities. No recent weight gain or loss, A full 10 point Review of Systems was done, except as stated above, all other Review of Systems were negative.  Social History History  Substance Use Topics  . Smoking status: Former Smoker    Quit date: 11/22/1982  . Smokeless tobacco: Never Used     Comment: FQuit smoking x 32 years  . Alcohol Use: No  Smoked for 20 years but quit 30 years ago.  Active.  Lives at home alone.  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  Family History Family History  Problem Relation Age of Onset  . Cancer Mother     Colon  . Cancer Father     Lung, Mouth,leg    Prior to Admission medications   Medication Sig Start Date End Date Taking? Authorizing Provider  alendronate (FOSAMAX) 70 MG tablet Take 1 tablet (70 mg total) by mouth once a week.  Take with a full glass of water on an empty stomach. 06/21/14   Chauncey Cruel, MD  Homeopathic Products (ARNICA) GEL Apply topically every morning. Feet, legs to the knees    Historical Provider, MD  HYDROcodone-acetaminophen (NORCO) 10-325 MG per tablet Take 1-2 tablets by mouth every 4 (four) hours as needed for moderate pain or severe pain. 07/05/14   Chauncey Cruel, MD  letrozole [facility]) 2.5 MG tablet Take 1 tablet (2.5 mg total) by mouth daily. 08/02/14   Chauncey Cruel, MD  lisinopril (PRINIVIL,ZESTRIL) 20 MG tablet Take 1 tablet (20 mg total) by mouth daily. 06/21/14   Chauncey Cruel, MD  loperamide (IMODIUM A-D) 2 MG tablet Take 2 mg by mouth 4 (four) times daily as needed for diarrhea or loose stools.    Historical Provider, MD  mometasone (ELOCON) 0.1 % lotion Apply topically daily as needed. Use as directed 06/29/12   Annia Belt, MD  ondansetron (ZOFRAN-ODT) 8 MG disintegrating tablet DISSOLVE 1 TABLET BY MOUTH EVERY 8 HOURS AS NEEDED FOR NAUSEA 04/27/14   Chauncey Cruel, MD  palbociclib [facility]) 125 MG capsule  Take 1 capsule (125 mg total) by mouth daily. Take 21days out of 28 with Letrozole, with food 08/02/14   Chauncey Cruel, MD  warfarin (COUMADIN) 4 MG tablet Take 0.5-1 tablets (2-4 mg total) by mouth daily. 25m on Mon.,Wed.,Fri., and 451mall other days 06/21/14   GuChauncey CruelMD    Allergies  Allergen Reactions  . Aspirin   . Percodan [Oxycodone-Aspirin] Nausea Only  . Codeine Nausea Only    Physical Exam  Vitals  Blood pressure 125/57, pulse 69, temperature 98.7 F (37.1 C), resp. rate 16, height 5' 2"  (1.575 m), weight 61.236 kg (135 lb), SpO2 94 %.   General: Wd, thin,  female with spunk,  lying in bed in NAD  Psych:  Normal affect and insight, Not Suicidal or Homicidal, Awake Alert, Oriented X 3.  Neuro:   No F.N deficits, ALL C.Nerves Intact, Strength 5/5 all 4 extremities, Sensation intact all 4 extremities.  ENT:  Slight erythema in tonsillar area.  No exudates.    Neck:  Supple, No lymphadenopathy appreciated  Respiratory:  Symmetrical chest wall movement, Good air movement bilaterally, CTAB.  Cardiac:  RRR, No Murmurs, no LE edema noted, no JVD.    Abdomen:  Positive bowel sounds, Soft, Non tender, Non distended,  No masses appreciated  Skin:  No Cyanosis, Normal Skin Turgor, No Skin Rash or Bruise.  Extremities:  Feet are cold, 1+ edema bilaterally.    Data Review  Labs are Pending.   [facility]  Creatinine was .5571 [facility]  New cavitary mass lesion in LUL with extensive surrounding pneumonia.  Could be a cavitary malignancy or a lung abscess; Emphysema.  EKG Pending:     Assessment & Plan  Principal Problem:   HCAP (healthcare-associated pneumonia) Active Problems:   Cavitating mass in left upper lung lobe   GERD   Nausea without vomiting   Breast cancer metastasized to bone   MRSA Pneumonia Possibly post obstructive.  Being treated with Vanc and Zosyn.  Zosyn  started 08/05/2014.  Vanc started 08/09/2014. Delsym / Ativan as supportive treatment  LUL Mass Cardio thoracic surgery consulted. Will place on heparin per pharmacy rather than coumadin until they have evaluated her.  GERD with hx of stricture Protonix.  Regular  diet.  Recent diarrhea Likely from resolving infection. Now having normal BM.  C-diff neg at [facility]. Imodium if needed.  Metastatic BRCA. Dr. [name] is aware patient is in the hospital. Pain medication ordered.   DVT Prophylaxis:  Full dose heparin per pharmacy  AM Labs Ordered, also please review Full Orders  Family Communication:   Patient is alert and orientated - she understands her plan of care.  Code Status:  Full code  Condition:  Stable   Time spent in minutes : [place],  PA-C on 08/09/2014 at 2:04 PM  Between 7am to 7pm - Pager - [phone]  After 7pm go to [email or web address] - password [email or web address]  And look for the night coverage person covering me after hours  [facility]

## 2014-08-09 NOTE — Evaluation (Signed)
Physical Therapy Evaluation/Discharge Patient Details Name: [name] MRN: [id number] DOB: 1946/01/25 Today's Date: 08/09/2014   History of Present Illness  69 y.o. female admitted to [facility] from [facility] due to HCAP MRSA PNA.  Pt with significant PMHx of  CA (breast, lower back), R ankle fx, anorexia, bil mastectomy, rotator cuff repair.    Clinical Impression  Pt is ambulating independently at this time.  She and RN staff will be able to walk the hallways without PT following acutely.  The only thing that could change this is if she does end up having surgery or another invasive procedure, so please re-order if needed.  Otherwise, PT to sign off as pt has no acute PT needs at this time.     Follow Up Recommendations No PT follow up    Equipment Recommendations  None recommended by PT    Recommendations for Other Services   NA    Precautions / Restrictions Precautions Precautions: Other (comment) (contact) Restrictions Weight Bearing Restrictions: No      Mobility  Bed Mobility Overal bed mobility: Independent                Transfers Overall transfer level: Independent                  Ambulation/Gait Ambulation/Gait assistance: Independent Ambulation Distance (Feet): 510 Feet Assistive device: None Gait Pattern/deviations: WFL(Within Functional Limits) Gait velocity: decreased Gait velocity interpretation: Below normal speed for age/gender General Gait Details: Pt with slow, yet steady gait pattern.  Able to walk and talk without DOE.  She does have the occational dry cough.          Balance Overall balance assessment: No apparent balance deficits (not formally assessed)                                           Pertinent Vitals/Pain Pain Assessment: Faces Faces Pain Scale: Hurts little more Pain Location: left chest wall pain with deep inspiration.  Pain Descriptors / Indicators: Aching Pain Intervention(s):  Limited activity within patient's tolerance;Monitored during session;Repositioned    Home Living Family/patient expects to be discharged to:: Private residence Living Arrangements: Alone                    Prior Function Level of Independence: Independent                  Extremity/Trunk Assessment   Upper Extremity Assessment: Overall WFL for tasks assessed           Lower Extremity Assessment: Overall WFL for tasks assessed      Cervical / Trunk Assessment: Normal  Communication   Communication: No difficulties  Cognition Arousal/Alertness: Awake/alert Behavior During Therapy: WFL for tasks assessed/performed Overall Cognitive Status: Within Functional Limits for tasks assessed                      General Comments General comments (skin integrity, edema, etc.): O2 sats 90% on RA after gait with HR 91 bpm.           Assessment/Plan    PT Assessment Patent does not need any further PT services  PT Diagnosis Difficulty walking;Abnormality of gait;Generalized weakness;Acute pain   PT Problem List    PT Treatment Interventions     PT Goals (Current goals can be found in the Care Plan  section) Acute Rehab PT Goals Patient Stated Goal: to figure out what this nodule is in her left chest.  PT Goal Formulation: All assessment and education complete, DC therapy    Frequency      End of Session   Activity Tolerance: Patient limited by fatigue;Patient limited by pain Patient left: in bed;with call bell/phone within reach Nurse Communication: Other (comment) (Pt requesting nausea meds)         Time: 9794-8016 PT Time Calculation (min) (ACUTE ONLY): 21 min   Charges:   PT Evaluation $Initial PT Evaluation Tier I: 1 Procedure          Lenorris Karger B. [place], [place], DPT [phone]   08/09/2014, 4:03 PM

## 2014-08-10 ENCOUNTER — Inpatient Hospital Stay (HOSPITAL_COMMUNITY): Payer: Medicare PPO

## 2014-08-10 DIAGNOSIS — D381 Neoplasm of uncertain behavior of trachea, bronchus and lung: Secondary | ICD-10-CM

## 2014-08-10 LAB — BASIC METABOLIC PANEL
Anion gap: 6 (ref 5–15)
BUN: <5 mg/dL — ABNORMAL LOW (ref 6–23)
CO2: 30 mmol/L (ref 19–32)
CREATININE: 0.66 mg/dL (ref 0.50–1.10)
Calcium: 8.7 mg/dL (ref 8.4–10.5)
Chloride: 106 mmol/L (ref 96–112)
GFR calc Af Amer: >90 mL/min (ref >90)
GFR calc non Af Amer: 87 mL/min — ABNORMAL LOW (ref >90)
Glucose, Bld: 101 mg/dL — ABNORMAL HIGH (ref 70–99)
Potassium: 4.9 mmol/L (ref 3.5–5.1)
SODIUM: 142 mmol/L (ref 135–145)

## 2014-08-10 LAB — CBC
HCT: 34 % — ABNORMAL LOW (ref 36.0–46.0)
Hemoglobin: 11.1 g/dL — ABNORMAL LOW (ref 12.0–15.0)
MCH: 34.2 pg — ABNORMAL HIGH (ref 26.0–34.0)
MCHC: 32.6 g/dL (ref 30.0–36.0)
MCV: 104.6 fL — AB (ref 78.0–100.0)
Platelets: 319 10*3/uL (ref 150–400)
RBC: 3.25 MIL/uL — AB (ref 3.87–5.11)
RDW: 13.7 % (ref 11.5–15.5)
WBC: 6.8 10*3/uL (ref 4.0–10.5)

## 2014-08-10 LAB — PROTIME-INR
INR: 1.84 — ABNORMAL HIGH (ref 0.00–1.49)
INR: 1.76 — AB (ref 0.00–1.49)
PROTHROMBIN TIME: 21.4 s — AB (ref 11.6–15.2)
Prothrombin Time: 20.7 seconds — ABNORMAL HIGH (ref 11.6–15.2)

## 2014-08-10 LAB — HEPARIN LEVEL (UNFRACTIONATED)
Heparin Unfractionated: <0.1 IU/mL — ABNORMAL LOW (ref 0.30–0.70)
Heparin Unfractionated: <0.1 IU/mL — ABNORMAL LOW (ref 0.30–0.70)
Heparin Unfractionated: 0.35 IU/mL (ref 0.30–0.70)

## 2014-08-10 MED ORDER — WHITE PETROLATUM GEL
Status: AC
  Administered 2014-08-10: 23:00:00
  Filled 2014-08-10: qty 1

## 2014-08-10 MED ORDER — HEPARIN (PORCINE) IN NACL 100-0.45 UNIT/ML-% IJ SOLN
1350.0000 [IU]/h | INTRAMUSCULAR | Status: DC
  Administered 2014-08-10 (×2): 1200 [IU]/h via INTRAVENOUS
  Filled 2014-08-10 (×3): qty 250

## 2014-08-10 NOTE — Progress Notes (Signed)
ANTICOAGULATION CONSULT NOTE - FOLLOW UP  Pharmacy Consult:  Heparin Indication:  history of DVT  Allergies  Allergen Reactions  . Aspirin   . Percodan [Oxycodone-Aspirin] Nausea Only  . Codeine Nausea Only    Patient Measurements: Height: 5\' 2"  (157.5 cm) Weight: 135 lb (61.236 kg) IBW/kg (Calculated) : 50.1  Heparin dosing weight = 61 kg  Vital Signs: Temp: 97.6 F (36.4 C) (04/13 2131) Temp Source: Oral (04/13 2131) BP: 96/50 mmHg (04/13 2131) Pulse Rate: 72 (04/13 2131)  Labs:  Recent Labs  08/09/14 1348 08/10/14 0352  WBC 7.0 6.8  HGB 10.7* 11.1*  PLT 294 319  CREATININE 0.56 0.66   Estimated Creatinine Clearance: 56.3 mL/min (by C-G formula based on Cr of 0.66). No results for input(s): VANCOTROUGH, VANCOPEAK, VANCORANDOM, GENTTROUGH, GENTPEAK, GENTRANDOM, TOBRATROUGH, TOBRAPEAK, TOBRARND, AMIKACINPEAK, AMIKACINTROU, AMIKACIN in the last 72 hours.   Microbiology: No results found for this or any previous visit (from the past 720 hour(s)).    Assessment: 71 YOFw with history of DVT on Coumadin and VTE prophylactic dose of Lovenox at [facility].  Currently on IV heparin bridge therapy and heparin level is undetectable.  No bleeding reported.  No complication with infusion per RN.  Followup HL is therapeutic at 0.35 on heparin 1200 units/hr. No bleeding is noted.   Goal of Therapy:  HL 0.3 - 0.7 units/mL Monitor platelets   Plan:  - Continue heparin 1200 units/hr - Daily HL/CBC - Monitor s/sx of bleeding   Andrey Cota. Diona Foley, PharmD Clinical Pharmacist Pager [phone]  08/10/2014, 9:49 PM

## 2014-08-10 NOTE — Progress Notes (Signed)
COURTESY NOTE:  Mass looks a lot smaller on CXR than on CT. Options are IR Bx vs TSU intervention. Will ask IR to evaluate for possible BX to differentiate whether this is infectious vs neoplastic and if neoplastic whether breast or lung.  Will follow w you

## 2014-08-10 NOTE — Consult Note (Signed)
Reason for Consult: Left upper lobe mass Referring Physician: Dr. [name] Gherge  [name] is an 69 y.o. female.  HPI: 69 yo woman with a history of stage IV breast cancer, PUD and reflux with esophageal stricture, and DVT who originally presented with a complaint of nausea, vomiting and diarrhea beginning on April 3rd. She was admitted to [facility]. While in hospital she developed left sided pleuritic chest pain. Sputum cultures were positive for MRSA. A CT showed a left upper lobe pneumonia and thick walled cavitary "mass." She was frustrated due to a lack of progress and was transferred to [facility]. Her nausea and vomiting and diarrhea have improved, but her pleuritic pain has persisted.   Past Medical History  Diagnosis Date  . Cancer     RBreast, Lower back , Under l Arm  . Hypertension   . GERD (gastroesophageal reflux disease)   . Breast cancer metastasized to bone 04/30/2011  . Radiation 01/15/2012    Lumbosacral spine 3500 cGy 14  fx  . Swollen R ankle 08/10/2012  . Fracture of ankle, medial malleolus, right, closed 08/10/2012  . Tubular adenoma 2011  . Hiatal hernia 2011  . Schatzki's ring   . Anorexia 03/02/2013  . Weight loss due to medication 03/02/2013    Past Surgical History  Procedure Laterality Date  . Gallbladder surgery  1991  . Mastectomy  06/28/10    bilateral- Dr. Rosebud Poles, [place]  . Inguinal hernia repair  90's    right  . Parathyroidectomy  90's  . Carpal tunnel release  90's  . Oophorectomy  90's    right  . Lymph node dissection    . Breast surgery    . Cholecystectomy    . Colonoscopy  01/08/10    Dr. [name]- anal tag,hemorrhoid o/w normal rectum, pancolonic diverticula, diminutive polyp in the base of the cecum= tubular adenoma on bx. poor prep  . Esophagogastroduodenoscopy  01/08/10    Dr. [name]- normal esophagus, small hiatal hernia, antum and body erosions, pedunculated polyp between D1 and D2, duodenal diverticulum, lymphangiectasia,  second portion of the duodenum.stomach bx= inflammation, duodenum bx= adenoma  . Rotator cuff repair  08/27/11    Dr. Alphonzo Cruise- [facility]- [place] [facility]  . Esophagogastroduodenoscopy (egd) with propofol N/A 11/19/2012    RMR: non-critical Schatzki's ring s/p dilation with 24 F, multiple gastric polyps, duodenal polyp s/p piecemeal snare polypectomy, gastric polypectomy, path benign  . Maloney dilation N/A 11/19/2012    Procedure: Venia Minks DILATION;  Surgeon: Daneil Dolin, MD;  Location: [facility];  Service: Endoscopy;  Laterality: N/A;  #54  . Polypectomy N/A 11/19/2012    Procedure: POLYPECTOMY;  Surgeon: Daneil Dolin, MD;  Location: [facility];  Service: Endoscopy;  Laterality: N/A;  duodenal  . Esophageal biopsy N/A 11/19/2012    Procedure: BIOPSY;  Surgeon: Daneil Dolin, MD;  Location: [facility];  Service: Endoscopy;  Laterality: N/A;  esophageal    Family History  Problem Relation Age of Onset  . Cancer Mother     Colon  . Cancer Father     Lung, Mouth,leg    Social History:  reports that she quit smoking about 31 years ago. She has never used smokeless tobacco. She reports that she does not drink alcohol or use illicit drugs.  Allergies:  Allergies  Allergen Reactions  . Aspirin   . Percodan [Oxycodone-Aspirin] Nausea Only  . Codeine Nausea Only    Medications:  Scheduled: . pantoprazole  40 mg Oral Daily  .  piperacillin-tazobactam (ZOSYN)  IV  3.375 g Intravenous Q8H  . sodium chloride  3 mL Intravenous Q12H  . sodium chloride  3 mL Intravenous Q12H  . vancomycin  750 mg Intravenous Q12H    Results for orders placed or performed during the hospital encounter of 08/09/14 (from the past 48 hour(s))  Comprehensive metabolic panel     Status: Abnormal   Collection Time: 08/09/14  1:48 PM  Result Value Ref Range   Sodium 138 135 - 145 mmol/L   Potassium 4.3 3.5 - 5.1 mmol/L   Chloride 101 96 - 112 mmol/L   CO2 29 19 - 32 mmol/L   Glucose, Bld 95 70 - 99 mg/dL   BUN <5 (L) 6 - 23 mg/dL    Creatinine, Ser 0.56 0.50 - 1.10 mg/dL   Calcium 8.7 8.4 - 10.5 mg/dL   Total Protein 5.8 (L) 6.0 - 8.3 g/dL   Albumin 2.5 (L) 3.5 - 5.2 g/dL   AST 29 0 - 37 U/L   ALT 39 (H) 0 - 35 U/L   Alkaline Phosphatase 67 39 - 117 U/L   Total Bilirubin 0.4 0.3 - 1.2 mg/dL   GFR calc non Af Amer >90 >90 mL/min   GFR calc Af Amer >90 >90 mL/min    Comment: (NOTE) The eGFR has been calculated using the CKD EPI equation. This calculation has not been validated in all clinical situations. eGFR's persistently <90 mL/min signify possible Chronic Kidney Disease.    Anion gap 8 5 - 15  CBC     Status: Abnormal   Collection Time: 08/09/14  1:48 PM  Result Value Ref Range   WBC 7.0 4.0 - 10.5 K/uL   RBC 3.15 (L) 3.87 - 5.11 MIL/uL   Hemoglobin 10.7 (L) 12.0 - 15.0 g/dL   HCT 32.2 (L) 36.0 - 46.0 %   MCV 102.2 (H) 78.0 - 100.0 fL   MCH 34.0 26.0 - 34.0 pg   MCHC 33.2 30.0 - 36.0 g/dL   RDW 13.6 11.5 - 15.5 %   Platelets 294 150 - 400 K/uL  Heparin level (unfractionated)     Status: Abnormal   Collection Time: 08/09/14 11:08 PM  Result Value Ref Range   Heparin Unfractionated <0.10 (L) 0.30 - 0.70 IU/mL    Comment:        IF HEPARIN RESULTS ARE BELOW EXPECTED VALUES, AND PATIENT DOSAGE HAS BEEN CONFIRMED, SUGGEST FOLLOW UP TESTING OF ANTITHROMBIN III LEVELS.   Basic metabolic panel     Status: Abnormal   Collection Time: 08/10/14  3:52 AM  Result Value Ref Range   Sodium 142 135 - 145 mmol/L   Potassium 4.9 3.5 - 5.1 mmol/L   Chloride 106 96 - 112 mmol/L   CO2 30 19 - 32 mmol/L   Glucose, Bld 101 (H) 70 - 99 mg/dL   BUN <5 (L) 6 - 23 mg/dL   Creatinine, Ser 0.66 0.50 - 1.10 mg/dL   Calcium 8.7 8.4 - 10.5 mg/dL   GFR calc non Af Amer 87 (L) >90 mL/min   GFR calc Af Amer >90 >90 mL/min    Comment: (NOTE) The eGFR has been calculated using the CKD EPI equation. This calculation has not been validated in all clinical situations. eGFR's persistently <90 mL/min signify possible Chronic  Kidney Disease.    Anion gap 6 5 - 15  CBC     Status: Abnormal   Collection Time: 08/10/14  3:52 AM  Result Value Ref Range  WBC 6.8 4.0 - 10.5 K/uL   RBC 3.25 (L) 3.87 - 5.11 MIL/uL   Hemoglobin 11.1 (L) 12.0 - 15.0 g/dL   HCT 34.0 (L) 36.0 - 46.0 %   MCV 104.6 (H) 78.0 - 100.0 fL   MCH 34.2 (H) 26.0 - 34.0 pg   MCHC 32.6 30.0 - 36.0 g/dL   RDW 13.7 11.5 - 15.5 %   Platelets 319 150 - 400 K/uL  Protime-INR     Status: Abnormal   Collection Time: 08/10/14  3:52 AM  Result Value Ref Range   Prothrombin Time 21.4 (H) 11.6 - 15.2 seconds   INR 1.84 (H) 0.00 - 1.49    Dg Chest 2 View  08/10/2014   CLINICAL DATA:  Shortness of breath, weakness  EXAM: CHEST  2 VIEW  COMPARISON:  Portable chest x-ray of 08/09/2014 and CT chest of 08/05/2014  FINDINGS: The cavitary lesion in the periphery of the left upper lobe is again noted, perhaps slightly diminished in overall size. This may be inflammatory or infectious in nature and continued followup is recommended. The right lung is clear. No pleural effusion is seen. Surgical clips overlie both axilla. The heart is within upper limits of normal.  IMPRESSION: Perhaps slight improvement in left upper lobe opacity with cavitation. This may be inflammatory or infectious in etiology and continued followup is recommended.   Electronically Signed   By: Ivar Drape M.D.   On: 08/10/2014 08:14    Review of Systems  Constitutional: Positive for fever and malaise/fatigue.  Respiratory: Negative for cough.   Cardiovascular: Positive for chest pain (left side pleuritic).  Gastrointestinal: Positive for nausea, vomiting and diarrhea.  Musculoskeletal: Positive for back pain.  All other systems reviewed and are negative.  Blood pressure 106/54, pulse 70, temperature 98.7 F (37.1 C), temperature source Oral, resp. rate 16, height _0  (1.575 m), weight 135 lb (61.236 kg), SpO2 95 %. Physical Exam  Vitals reviewed. Constitutional: She is oriented to  person, place, and time. She appears well-developed. No distress.  HENT:  Head: Normocephalic and atraumatic.  Eyes: EOM are normal.  Neck: Neck supple. No thyromegaly present.  Cardiovascular: Normal rate, regular rhythm and normal heart sounds.   Respiratory: Effort normal and breath sounds normal. She has no wheezes.  GI: Soft. There is no tenderness.  Musculoskeletal: She exhibits edema (1+).  Lymphadenopathy:    She has no cervical adenopathy.  Neurological: She is alert and oriented to person, place, and time. No cranial nerve deficit.  Skin: Skin is warm and dry.    Assessment/Plan: 69 yo woman with stage IV breast cancer who presented initially with gastrointestinal complaints of nausea, vomiting and diarrhea. While in hospital developed left sided pleuritic CP. CT shows pneumonia with a thick walled cavitary lesion. Given the time course this most likely is a lung abscess. She has grown MRSA from her sputum. She is on appropriate broad spectrum antibiotics, including MRSA. She is also on zosyn which is appropriate as there could be an anaerobic organism in play as well.  There is no role for surgery at present. The primary treatment of lung abscess is antibiotic therapy. Surgery would be indicated if she were to develop an empyema.  I would not biopsy the lesion acutely due to risk of contaminating the pleural space or creating a BPF.  She will need follow up scans to ensure resolution. If not resolved or significantly improved a biopsy would be appropriate down the road  Revonda Standard  Hendrickson 08/10/2014, 8:39 AM

## 2014-08-10 NOTE — Progress Notes (Signed)
ANTICOAGULATION CONSULT NOTE - FOLLOW UP  Pharmacy Consult:  Heparin Indication:  history of DVT  Allergies  Allergen Reactions  . Aspirin   . Percodan [Oxycodone-Aspirin] Nausea Only  . Codeine Nausea Only    Patient Measurements: Height: 5\' 2"  (157.5 cm) Weight: 135 lb (61.236 kg) IBW/kg (Calculated) : 50.1  Heparin dosing weight = 61 kg  Vital Signs: Temp: 98.7 F (37.1 C) (04/13 0609) Temp Source: Oral (04/13 0609) BP: 106/54 mmHg (04/13 0609)  Labs:  Recent Labs  08/09/14 1348 08/10/14 0352  WBC 7.0 6.8  HGB 10.7* 11.1*  PLT 294 319  CREATININE 0.56 0.66   Estimated Creatinine Clearance: 56.3 mL/min (by C-G formula based on Cr of 0.66). No results for input(s): VANCOTROUGH, VANCOPEAK, VANCORANDOM, GENTTROUGH, GENTPEAK, GENTRANDOM, TOBRATROUGH, TOBRAPEAK, TOBRARND, AMIKACINPEAK, AMIKACINTROU, AMIKACIN in the last 72 hours.   Microbiology: No results found for this or any previous visit (from the past 720 hour(s)).    Assessment: 61 YOFw with history of DVT on Coumadin and VTE prophylactic dose of Lovenox at [facility].  Currently on IV heparin bridge therapy and heparin level is undetectable.  No bleeding reported.  No complication with infusion per RN.   Goal of Therapy:  Vancomycin trough level 15-20 mcg/ml  HL 0.3 - 0.7 units/mL Monitor platelets   Plan:  - Increase heparin gtt to 1200 units/hr - Check 8 hr HL - Daily HL / CBC - Vanc 750mg  IV Q12H - Zosyn 3.375gm IV Q8H, 4 hr infusion.  ?change to Flagyl - Monitor renal fxn, clinical progress, vanc trough at [facility]    Kayly Kriegel D. Mina Marble, PharmD, BCPS Pager:  [phone] 08/10/2014, 11:25 AM

## 2014-08-10 NOTE — Progress Notes (Signed)
ANTICOAGULATION CONSULT NOTE - Follow Up Consult  Pharmacy Consult for heparin Indication: DVT   Labs:  Recent Labs  08/09/14 1348 08/09/14 2308  HGB 10.7*  --   HCT 32.2*  --   PLT 294  --   HEPARINUNFRC  --  <0.10*  CREATININE 0.56  --      Assessment: 69yo female undetectable on heparin with initial dosing for h/o DVT and low INR.  Goal of Therapy:  Heparin level 0.3-0.7 units/ml   Plan:  Will increase heparin gtt by 4 units/kg/hr to 1000 units/hr and check level in 8hr.  NOTE: This is a late entry 2/2 Epic downtime; rate change was called to RN at 0050.  Wynona Neat, PharmD, BCPS  08/10/2014,3:12 AM

## 2014-08-10 NOTE — Progress Notes (Signed)
[facility] PROGRESS NOTE  [name] DGU:[id number] DOB: [birth year] DOA: 08/09/2014 PCP: Curlene Labrum, MD  Assessment/Plan: Principal Problem:   HCAP (healthcare-associated pneumonia) Active Problems:   GERD   Nausea without vomiting   Breast cancer metastasized to bone   Cavitating mass in left upper lung lobe    MRSA Pneumonia A CT showed a left upper lobe pneumonia and thick walled cavitary "mass Being treated with Vanc and Zosyn. Zosyn started 08/05/2014. Vanc started 08/09/2014. Delsym / Ativan as supportive treatment Seen by cardiothoracic surgery,Steven Chaya Jan, MD As per their assessment the patient could have a lung abscess, no role for surgery at this time Continue broad-spectrum antibiotics   LUL Mass Cardio thoracic surgery consulted. As above CTS recommends against biopsy due to risk of contaminating the pleural space or creating a bronchopulmonary fistula Therefore we will discontinue ultrasound-guided biopsy She will need repeat CT scan to ensure resolution, if no resolution of biopsy would be appropriate.  GERD with hx of stricture Protonix. Regular diet.  Recent diarrhea Likely from resolving infection. Repeat C. difficile if diarrhea recurs Now having normal BM. C-diff neg at [facility]. Imodium if needed.  Metastatic BRCA. Dr. [name] is aware patient is in the hospital. Pain medication ordered.  History of DVT Continue heparin drip Will start Coumadin after discussing with hematology oncology if no further procedures are planned INR 1.84 today   DVT Prophylaxis: Full dose heparin per pharmacy  Code Status: full Family Communication: family updated about patient's clinical progress Disposition Plan:  As above    Brief narrative: *69 year old female with a history of metastatic breast cancer was hospitalized at [facility] for the last 9 days, where she presented with a pneumonia which has progressively gotten  worse, sputum cultures showed MRSA, she was initially on CAP antibiotics and these were later transitioned to vancomycin and Zosyn. She has progressively gotten worse and she had had a CT scan of the chest which showed a left upper lobe mass. She is being transferred to [facility] for [facility] and oncology evaluation as her primary oncologist is here. 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 Patient tells me that on admission and for the first 7 days when she was hospitalized, she had profuse diarrhea as well as nausea and vomiting, her C. difficile was negative, and his symptoms have resolved and now she is able to tolerate a regular diet. Will continue vancomycin and Zosyn here as well.   Consultants:  Hematology/Oncology  Cardiothoracic surgery  Procedures:  None  Antibiotics: Zosyn/vancomycin  HPI/Subjective: Still has a slight nonproductive cough, afebrile overnight  Objective: Filed Vitals:   08/09/14 1238 08/09/14 1559 08/09/14 2228 08/10/14 0609  BP: 125/57  119/63 106/54  Pulse: 69 91 70   Temp: 98.7 F (37.1 C)  99.2 F (37.3 C) 98.7 F (37.1 C)  TempSrc:   Oral Oral  Resp: _0 Height: _1  (1.575 m)     Weight: 61.236 kg (135 lb)     SpO2: 94% 90% 90% 95%    Intake/Output Summary (Last 24 hours) at 08/10/14 1011 Last data filed at 08/09/14 1900  Gross per 24 hour  Intake    240 ml  Output      2 ml  Net    238 ml    Exam:  General: Wd, thin, female with  spunk, lying in bed in NAD  Psych: Normal affect and insight, Not Suicidal or Homicidal, Awake Alert, Oriented X 3.  Neuro: No F.N deficits, ALL C.Nerves Intact, Strength 5/5 all 4 extremities, Sensation intact all 4 extremities.  ENT: Slight erythema in tonsillar area. No exudates.   Neck: Supple, No lymphadenopathy appreciated  Respiratory: Symmetrical chest wall  movement, Good air movement bilaterally, CTAB.  Cardiac: RRR, No Murmurs, no LE edema noted, no JVD.   Abdomen: Positive bowel sounds, Soft, Non tender, Non distended, No masses appreciated  Skin: No Cyanosis, Normal Skin Turgor, No Skin Rash or Bruise.  Extremities: Feet are cold, 1+ edema bilaterally.       Data Reviewed: Basic Metabolic Panel:  Recent Labs Lab 08/09/14 1348 08/10/14 0352  NA 138 142  K 4.3 4.9  CL 101 106  CO2 29 30  GLUCOSE 95 101*  BUN <5* <5*  CREATININE 0.56 0.66  CALCIUM 8.7 8.7    Liver Function Tests:  Recent Labs Lab 08/09/14 1348  AST 29  ALT 39*  ALKPHOS 67  BILITOT 0.4  PROT 5.8*  ALBUMIN 2.5*   No results for input(s): LIPASE, AMYLASE in the last 168 hours. No results for input(s): AMMONIA in the last 168 hours.  CBC:  Recent Labs Lab 08/09/14 1348 08/10/14 0352  WBC 7.0 6.8  HGB 10.7* 11.1*  HCT 32.2* 34.0*  MCV 102.2* 104.6*  PLT 294 319    Cardiac Enzymes: No results for input(s): CKTOTAL, CKMB, CKMBINDEX, TROPONINI in the last 168 hours. BNP (last 3 results) No results for input(s): BNP in the last 8760 hours.  ProBNP (last 3 results) No results for input(s): PROBNP in the last 8760 hours.    CBG: No results for input(s): GLUCAP in the last 168 hours.  No results found for this or any previous visit (from the past 240 hour(s)).   Studies: Dg Chest 2 View  08/10/2014   CLINICAL DATA:  Shortness of breath, weakness  EXAM: CHEST  2 VIEW  COMPARISON:  Portable chest x-ray of 08/09/2014 and CT chest of 08/05/2014  FINDINGS: The cavitary lesion in the periphery of the left upper lobe is again noted, perhaps slightly diminished in overall size. This may be inflammatory or infectious in nature and continued followup is recommended. The right lung is clear. No pleural effusion is seen. Surgical clips overlie both axilla. The heart is within upper limits of normal.  IMPRESSION: Perhaps slight improvement in  left upper lobe opacity with cavitation. This may be inflammatory or infectious in etiology and continued followup is recommended.   Electronically Signed   By: Ivar Drape M.D.   On: 08/10/2014 08:14    Scheduled Meds: . pantoprazole  40 mg Oral Daily  . piperacillin-tazobactam (ZOSYN)  IV  3.375 g Intravenous Q8H  . sodium chloride  3 mL Intravenous Q12H  . sodium chloride  3 mL Intravenous Q12H  . vancomycin  750 mg Intravenous Q12H   Continuous Infusions: . heparin 1,000 Units/hr (08/10/14 0314)    Principal Problem:   HCAP (healthcare-associated pneumonia) Active Problems:   GERD   Nausea without vomiting   Breast cancer metastasized to bone   Cavitating mass in left upper lung lobe    Time spent: 40 minutes   [facility] Pager [phone]. If 7PM-7AM, please contact night-coverage at [email or web address], password [facility] 08/10/2014, 10:11 AM  LOS: 1 day

## 2014-08-11 LAB — COMPREHENSIVE METABOLIC PANEL
ALBUMIN: 2.2 g/dL — AB (ref 3.5–5.2)
ALT: 32 U/L (ref 0–35)
ANION GAP: 8 (ref 5–15)
AST: 23 U/L (ref 0–37)
Alkaline Phosphatase: 56 U/L (ref 39–117)
BILIRUBIN TOTAL: 0.4 mg/dL (ref 0.3–1.2)
BUN: 8 mg/dL (ref 6–23)
CHLORIDE: 105 mmol/L (ref 96–112)
CO2: 29 mmol/L (ref 19–32)
CREATININE: 0.71 mg/dL (ref 0.50–1.10)
Calcium: 8.1 mg/dL — ABNORMAL LOW (ref 8.4–10.5)
GFR calc Af Amer: >90 mL/min (ref >90)
GFR, EST NON AFRICAN AMERICAN: 85 mL/min — AB (ref >90)
Glucose, Bld: 106 mg/dL — ABNORMAL HIGH (ref 70–99)
Potassium: 4 mmol/L (ref 3.5–5.1)
Sodium: 142 mmol/L (ref 135–145)
Total Protein: 5.2 g/dL — ABNORMAL LOW (ref 6.0–8.3)

## 2014-08-11 LAB — CBC
HEMATOCRIT: 30.8 % — AB (ref 36.0–46.0)
Hemoglobin: 10.1 g/dL — ABNORMAL LOW (ref 12.0–15.0)
MCH: 33.8 pg (ref 26.0–34.0)
MCHC: 32.8 g/dL (ref 30.0–36.0)
MCV: 103 fL — AB (ref 78.0–100.0)
Platelets: 339 10*3/uL (ref 150–400)
RBC: 2.99 MIL/uL — ABNORMAL LOW (ref 3.87–5.11)
RDW: 13.6 % (ref 11.5–15.5)
WBC: 6 10*3/uL (ref 4.0–10.5)

## 2014-08-11 LAB — PROTIME-INR
INR: 1.68 — ABNORMAL HIGH (ref 0.00–1.49)
Prothrombin Time: 20 seconds — ABNORMAL HIGH (ref 11.6–15.2)

## 2014-08-11 LAB — HEPARIN LEVEL (UNFRACTIONATED): Heparin Unfractionated: 0.25 IU/mL — ABNORMAL LOW (ref 0.30–0.70)

## 2014-08-11 MED ORDER — MENTHOL 3 MG MT LOZG: 1.0000 | LOZENGE | OROMUCOSAL | Status: DC | PRN

## 2014-08-11 MED ORDER — ACETAMINOPHEN 325 MG PO TABS: 650.0000 mg | ORAL_TABLET | Freq: Four times a day (QID) | ORAL | Status: DC | PRN

## 2014-08-11 MED ORDER — DEXTROMETHORPHAN POLISTIREX 30 MG/5ML PO LQCR: 30.0000 mg | Freq: Two times a day (BID) | ORAL | Status: DC | PRN

## 2014-08-11 MED ORDER — WARFARIN SODIUM 5 MG PO TABS
7.5000 mg | ORAL_TABLET | Freq: Once | ORAL | Status: AC
  Administered 2014-08-11: 7.5 mg via ORAL
  Filled 2014-08-11: qty 2

## 2014-08-11 MED ORDER — POLYETHYLENE GLYCOL 3350 17 G PO PACK: 17.0000 g | PACK | Freq: Every day | ORAL | Status: DC | PRN

## 2014-08-11 MED ORDER — PANTOPRAZOLE SODIUM 40 MG PO TBEC: 40.0000 mg | DELAYED_RELEASE_TABLET | Freq: Every day | ORAL | Status: DC

## 2014-08-11 MED ORDER — LINEZOLID 600 MG PO TABS: 600.0000 mg | ORAL_TABLET | Freq: Two times a day (BID) | ORAL | Status: AC

## 2014-08-11 MED ORDER — WARFARIN - PHYSICIAN DOSING INPATIENT: Freq: Every day | Status: DC

## 2014-08-11 MED ORDER — MENTHOL 3 MG MT LOZG
1.0000 | LOZENGE | OROMUCOSAL | Status: DC | PRN
Start: 2014-08-11 — End: 2014-08-11

## 2014-08-11 NOTE — Discharge Summary (Addendum)
Physician Discharge Summary  KRISTIANE MORSCH MRN: [id number] DOB/AGE: Jul 02, 1945 69 y.o.  PCP: Curlene Labrum, MD   Admit date: 08/09/2014 Discharge date: 08/11/2014    Patient Care Team: Curlene Labrum, MD as PCP - General Gery Pray, MD (Radiation Oncology) Daneil Dolin, MD (Gastroenterology) Annia Belt, MD (Hematology and Oncology) Chauncey Cruel, MD as Consulting Physician (Oncology) PCP: Curlene Labrum, MD GYN: Gari Crown MD SU:  OTHER MD: Discharge Diagnoses:     Principal Problem:   HCAP (healthcare-associated pneumonia) Active Problems:   GERD   Nausea without vomiting   Breast cancer metastasized to bone   Cavitating mass in left upper lung lobe  Follow-up recommendations Follow-up with PCP in 3-5 days Follow-up INR 4/18 Follow-up with oncology in one week Follow-up CBC, CMP required on follow-up     Medication List    STOP taking these medications        Arnica Gel     lisinopril 20 MG tablet  Commonly known as:  PRINIVIL,ZESTRIL     loperamide 2 MG tablet  Commonly known as:  IMODIUM A-D      TAKE these medications        acetaminophen 325 MG tablet  Commonly known as:  TYLENOL  Take 2 tablets (650 mg total) by mouth every 6 (six) hours as needed for mild pain or moderate pain (headache).     alendronate 70 MG tablet  Commonly known as:  FOSAMAX  Take 1 tablet (70 mg total) by mouth once a week. Take with a full glass of water on an empty stomach.     dextromethorphan 30 MG/5ML liquid  Commonly known as:  DELSYM  Take 5 mLs (30 mg total) by mouth 2 (two) times daily as needed for cough.     HYDROcodone-acetaminophen 10-325 MG per tablet  Commonly known as:  NORCO  Take 1-2 tablets by mouth every 4 (four) hours as needed for moderate pain or severe pain.     letrozole 2.5 MG tablet  Commonly known as:  FEMARA  Take 1 tablet (2.5 mg total) by mouth daily.     linezolid 600 MG tablet  Commonly known as:   ZYVOX  Take 1 tablet (600 mg total) by mouth 2 (two) times daily.     mometasone 0.1 % lotion  Commonly known as:  ELOCON  Apply topically daily as needed. Use as directed     ondansetron 8 MG disintegrating tablet  Commonly known as:  ZOFRAN-ODT  DISSOLVE 1 TABLET BY MOUTH EVERY 8 HOURS AS NEEDED FOR NAUSEA     palbociclib 125 MG capsule  Commonly known as:  IBRANCE  Take 1 capsule (125 mg total) by mouth daily. Take 21days out of 28 with Letrozole, with food     pantoprazole 40 MG tablet  Commonly known as:  PROTONIX  Take 1 tablet (40 mg total) by mouth daily.     polyethylene glycol packet  Commonly known as:  MIRALAX / GLYCOLAX  Take 17 g by mouth daily as needed for mild constipation.     SYSTANE OP  Apply 1 drop to eye daily as needed (dry eyes).     warfarin 4 MG tablet  Commonly known as:  COUMADIN  Take 0.5-1 tablets (2-4 mg total) by mouth daily. 74m on Mon.,Wed.,Fri., and 421mall other days        Discharge Condition: Stable  Disposition: 01-Home or Self Care   Consults:  Hematology oncology Cardiothoracic  surgery   Significant Diagnostic Studies: Dg Chest 2 View  08/10/2014   CLINICAL DATA:  Shortness of breath, weakness  EXAM: CHEST  2 VIEW  COMPARISON:  Portable chest x-ray of 08/09/2014 and CT chest of 08/05/2014  FINDINGS: The cavitary lesion in the periphery of the left upper lobe is again noted, perhaps slightly diminished in overall size. This may be inflammatory or infectious in nature and continued followup is recommended. The right lung is clear. No pleural effusion is seen. Surgical clips overlie both axilla. The heart is within upper limits of normal.  IMPRESSION: Perhaps slight improvement in left upper lobe opacity with cavitation. This may be inflammatory or infectious in etiology and continued followup is recommended.   Electronically Signed   By: Ivar Drape M.D.   On: 08/10/2014 08:14    S  Microbiology: No results found for this or any  previous visit (from the past 240 hour(s)).   Labs: Results for orders placed or performed during the hospital encounter of 08/09/14 (from the past 48 hour(s))  Comprehensive metabolic panel     Status: Abnormal   Collection Time: 08/09/14  1:48 PM  Result Value Ref Range   Sodium 138 135 - 145 mmol/L   Potassium 4.3 3.5 - 5.1 mmol/L   Chloride 101 96 - 112 mmol/L   CO2 29 19 - 32 mmol/L   Glucose, Bld 95 70 - 99 mg/dL   BUN <5 (L) 6 - 23 mg/dL   Creatinine, Ser 0.56 0.50 - 1.10 mg/dL   Calcium 8.7 8.4 - 10.5 mg/dL   Total Protein 5.8 (L) 6.0 - 8.3 g/dL   Albumin 2.5 (L) 3.5 - 5.2 g/dL   AST 29 0 - 37 U/L   ALT 39 (H) 0 - 35 U/L   Alkaline Phosphatase 67 39 - 117 U/L   Total Bilirubin 0.4 0.3 - 1.2 mg/dL   GFR calc non Af Amer >90 >90 mL/min   GFR calc Af Amer >90 >90 mL/min    Comment: (NOTE) The eGFR has been calculated using the CKD EPI equation. This calculation has not been validated in all clinical situations. eGFR's persistently <90 mL/min signify possible Chronic Kidney Disease.    Anion gap 8 5 - 15  CBC     Status: Abnormal   Collection Time: 08/09/14  1:48 PM  Result Value Ref Range   WBC 7.0 4.0 - 10.5 K/uL   RBC 3.15 (L) 3.87 - 5.11 MIL/uL   Hemoglobin 10.7 (L) 12.0 - 15.0 g/dL   HCT 32.2 (L) 36.0 - 46.0 %   MCV 102.2 (H) 78.0 - 100.0 fL   MCH 34.0 26.0 - 34.0 pg   MCHC 33.2 30.0 - 36.0 g/dL   RDW 13.6 11.5 - 15.5 %   Platelets 294 150 - 400 K/uL  Heparin level (unfractionated)     Status: Abnormal   Collection Time: 08/09/14 11:08 PM  Result Value Ref Range   Heparin Unfractionated <0.10 (L) 0.30 - 0.70 IU/mL    Comment:        IF HEPARIN RESULTS ARE BELOW EXPECTED VALUES, AND PATIENT DOSAGE HAS BEEN CONFIRMED, SUGGEST FOLLOW UP TESTING OF ANTITHROMBIN III LEVELS.   Basic metabolic panel     Status: Abnormal   Collection Time: 08/10/14  3:52 AM  Result Value Ref Range   Sodium 142 135 - 145 mmol/L   Potassium 4.9 3.5 - 5.1 mmol/L   Chloride 106  96 - 112 mmol/L   CO2 30  19 - 32 mmol/L   Glucose, Bld 101 (H) 70 - 99 mg/dL   BUN <5 (L) 6 - 23 mg/dL   Creatinine, Ser 0.66 0.50 - 1.10 mg/dL   Calcium 8.7 8.4 - 10.5 mg/dL   GFR calc non Af Amer 87 (L) >90 mL/min   GFR calc Af Amer >90 >90 mL/min    Comment: (NOTE) The eGFR has been calculated using the CKD EPI equation. This calculation has not been validated in all clinical situations. eGFR's persistently <90 mL/min signify possible Chronic Kidney Disease.    Anion gap 6 5 - 15  CBC     Status: Abnormal   Collection Time: 08/10/14  3:52 AM  Result Value Ref Range   WBC 6.8 4.0 - 10.5 K/uL   RBC 3.25 (L) 3.87 - 5.11 MIL/uL   Hemoglobin 11.1 (L) 12.0 - 15.0 g/dL   HCT 34.0 (L) 36.0 - 46.0 %   MCV 104.6 (H) 78.0 - 100.0 fL   MCH 34.2 (H) 26.0 - 34.0 pg   MCHC 32.6 30.0 - 36.0 g/dL   RDW 13.7 11.5 - 15.5 %   Platelets 319 150 - 400 K/uL  Protime-INR     Status: Abnormal   Collection Time: 08/10/14  3:52 AM  Result Value Ref Range   Prothrombin Time 21.4 (H) 11.6 - 15.2 seconds   INR 1.84 (H) 0.00 - 1.49  Heparin level (unfractionated)     Status: Abnormal   Collection Time: 08/10/14 10:02 AM  Result Value Ref Range   Heparin Unfractionated <0.10 (L) 0.30 - 0.70 IU/mL    Comment:        IF HEPARIN RESULTS ARE BELOW EXPECTED VALUES, AND PATIENT DOSAGE HAS BEEN CONFIRMED, SUGGEST FOLLOW UP TESTING OF ANTITHROMBIN III LEVELS. REPEATED TO VERIFY   Protime-INR     Status: Abnormal   Collection Time: 08/10/14  8:49 PM  Result Value Ref Range   Prothrombin Time 20.7 (H) 11.6 - 15.2 seconds   INR 1.76 (H) 0.00 - 1.49  Heparin level (unfractionated)     Status: None   Collection Time: 08/10/14  8:49 PM  Result Value Ref Range   Heparin Unfractionated 0.35 0.30 - 0.70 IU/mL    Comment:        IF HEPARIN RESULTS ARE BELOW EXPECTED VALUES, AND PATIENT DOSAGE HAS BEEN CONFIRMED, SUGGEST FOLLOW UP TESTING OF ANTITHROMBIN III LEVELS.   CBC     Status: Abnormal    Collection Time: 08/11/14  4:35 AM  Result Value Ref Range   WBC 6.0 4.0 - 10.5 K/uL   RBC 2.99 (L) 3.87 - 5.11 MIL/uL   Hemoglobin 10.1 (L) 12.0 - 15.0 g/dL   HCT 30.8 (L) 36.0 - 46.0 %   MCV 103.0 (H) 78.0 - 100.0 fL   MCH 33.8 26.0 - 34.0 pg   MCHC 32.8 30.0 - 36.0 g/dL   RDW 13.6 11.5 - 15.5 %   Platelets 339 150 - 400 K/uL  Heparin level (unfractionated)     Status: Abnormal   Collection Time: 08/11/14  4:35 AM  Result Value Ref Range   Heparin Unfractionated 0.25 (L) 0.30 - 0.70 IU/mL    Comment:        IF HEPARIN RESULTS ARE BELOW EXPECTED VALUES, AND PATIENT DOSAGE HAS BEEN CONFIRMED, SUGGEST FOLLOW UP TESTING OF ANTITHROMBIN III LEVELS.   Protime-INR     Status: Abnormal   Collection Time: 08/11/14  4:35 AM  Result Value Ref Range   Prothrombin  Time 20.0 (H) 11.6 - 15.2 seconds   INR 1.68 (H) 0.00 - 1.49  Comprehensive metabolic panel     Status: Abnormal   Collection Time: 08/11/14  4:35 AM  Result Value Ref Range   Sodium 142 135 - 145 mmol/L   Potassium 4.0 3.5 - 5.1 mmol/L    Comment: DELTA CHECK NOTED   Chloride 105 96 - 112 mmol/L   CO2 29 19 - 32 mmol/L   Glucose, Bld 106 (H) 70 - 99 mg/dL   BUN 8 6 - 23 mg/dL   Creatinine, Ser 0.71 0.50 - 1.10 mg/dL   Calcium 8.1 (L) 8.4 - 10.5 mg/dL   Total Protein 5.2 (L) 6.0 - 8.3 g/dL   Albumin 2.2 (L) 3.5 - 5.2 g/dL   AST 23 0 - 37 U/L   ALT 32 0 - 35 U/L   Alkaline Phosphatase 56 39 - 117 U/L   Total Bilirubin 0.4 0.3 - 1.2 mg/dL   GFR calc non Af Amer 85 (L) >90 mL/min   GFR calc Af Amer >90 >90 mL/min    Comment: (NOTE) The eGFR has been calculated using the CKD EPI equation. This calculation has not been validated in all clinical situations. eGFR's persistently <90 mL/min signify possible Chronic Kidney Disease.    Anion gap 8 5 - 15     HPI : 69 year old woman with history of ER-positive and ER negative metachronous primary bilateral breast cancers.  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  INTERVAL HISTORY:  history of stage IV breast cancer, PUD and reflux with esophageal stricture, and DVT who originally presented with a complaint of nausea, vomiting and diarrhea beginning on April 3rd. She was admitted to [facility]. While in hospital she developed left sided pleuritic chest pain. Sputum cultures were positive for MRSA. A CT showed a left upper lobe pneumonia and thick walled cavitary "mass." She was frustrated due to a lack of progress and was transferred to [facility]. Her nausea and vomiting and diarrhea have improved, but her pleuritic pain has persisted.  HOSPITAL COURSE:  MRSA Pneumonia A CT showed a left upper lobe pneumonia and thick walled cavitary "mass Initially started on Vanc and Zosyn. Zosyn started 08/05/2014. Vanc started 08/09/2014. Delsym / Ativan as supportive treatment Seen by cardiothoracic surgery,Steven Chaya Jan, MD As per their assessment the patient could have a lung abscess, no role for surgery at this time Discussed with infectious disease Dr. Drucilla Schmidt, MRSA of the sputum sensitive to Zyvox and tetracycline (hard copy of the culture and sensitivity in the chart) He recommends at least another 2 weeks of Zyvox, main concern would be monitoring for thrombocytopenia Fortunately patient is able to afford her Zyvox through her insurance Second alternative would be doxycycline for a total of 3 weeks If the patient develops any side effects of Zyvox and she can be switched to doxycycline after consultation with Dr. Drucilla Schmidt She will need close follow-up for CBC at Chauncey Cruel, MD office Repeat CT scan to ensure resolution   LUL Mass Cardio thoracic surgery consulted. As above CTS recommends against biopsy due to risk of contaminating the pleural space or creating a bronchopulmonary  fistula Therefore we will discontinue ultrasound-guided biopsy She will need repeat CT scan to ensure resolution, if no resolution of biopsy would be appropriate.  GERD with hx of stricture Protonix. Regular diet.  Recent diarrhea Likely from resolving infection. Repeat C. difficile if diarrhea recurs Now having normal BM. C-diff neg at [facility]. Imodium   If Diarrhea recurs  Metastatic BRCA. Dr. [name] is aware patient is in the hospital. Pain medication ordered.  History of DVT Patient started on heparin drip because of subtherapeutic INR INR prior to discharge is 1.68   will give 7.5 mg of Coumadin tonight , and then continue home dose and recheck INR on 4/18  Discharge Exam:   Blood pressure 105/56, pulse 42, temperature 97.6 F (36.4 C), temperature source Oral, resp. rate 18, height 5' 2"  (1.575 m), weight 61.236 kg (135 lb), SpO2 91 %.  General: Wd, thin, female with spunk, lying in bed in NAD  Psych: Normal affect and insight, Not Suicidal or Homicidal, Awake Alert, Oriented X 3.  Neuro: No F.N deficits, ALL C.Nerves Intact, Strength 5/5 all 4 extremities, Sensation intact all 4 extremities.  ENT: Slight erythema in tonsillar area. No exudates.   Neck: Supple, No lymphadenopathy appreciated  Respiratory: Symmetrical chest wall movement, Good air movement bilaterally, CTAB.  Cardiac: RRR, No Murmurs, no LE edema noted, no JVD.   Abdomen: Positive bowel sounds, Soft, Non tender, Non distended, No masses appreciated  Skin: No Cyanosis, Normal Skin Turgor, No Skin Rash or Bruise.  Extremities: Feet are cold, 1+ edema bilaterally.          Discharge Instructions    Diet - low sodium heart healthy    Complete by:  As directed      Increase activity slowly    Complete by:  As directed            Follow-up Information    Follow up with Curlene Labrum, MD. Schedule an appointment as soon as possible for a visit in 3 days.   Contact  information:   [place] [address] [phone]       Follow up with Chauncey Cruel, MD. Schedule an appointment as soon as possible for a visit in 1 week.   Specialty:  Oncology   Why:  Follow-up CBC, CMP needed   Contact information:   [place] [address] [phone]       Signed: Reyne Dumas 08/11/2014, 12:28 PM

## 2014-08-11 NOTE — Progress Notes (Signed)
Patient discharged to home with instructions. 

## 2014-08-11 NOTE — Discharge Instructions (Signed)
Follow-up recommendations Follow-up with PCP in 3-5 days Follow-up INR 4/18 Follow-up with oncology in one week Follow-up CBC, CMP required on follow-up

## 2014-08-11 NOTE — Evaluation (Signed)
Occupational Therapy Evaluation Patient Details Name: [name] MRN: [id number] DOB: 01/02/1946 Today's Date: 08/11/2014    History of Present Illness 69 y.o. female admitted to [facility] from [facility] due to HCAP MRSA PNA.  Pt with significant PMHx of  CA (breast, lower back), R ankle fx, anorexia, bil mastectomy, rotator cuff repair.     Clinical Impression   Patient evaluated by Occupational Therapy with no further acute OT needs identified. All education has been completed and the patient has no further questions. Pt is independent with ADLs.  She voices concerns about returning home alone due to her extended hospitalization.  See below for any follow-up Occupational Therapy or equipment needs. OT is signing off. Thank you for this referral.    Follow Up Recommendations  No OT follow up    Equipment Recommendations  None recommended by OT    Recommendations for Other Services       Precautions / Restrictions        Mobility Bed Mobility Overal bed mobility: Independent                Transfers Overall transfer level: Independent                    Balance Overall balance assessment: No apparent balance deficits (not formally assessed)                                          ADL Overall ADL's : Independent                                             Vision     Perception     Praxis      Pertinent Vitals/Pain Pain Assessment: Faces Faces Pain Scale: No hurt     Hand Dominance     Extremity/Trunk Assessment Upper Extremity Assessment Upper Extremity Assessment: Overall WFL for tasks assessed   Lower Extremity Assessment Lower Extremity Assessment: Overall WFL for tasks assessed   Cervical / Trunk Assessment Cervical / Trunk Assessment: Normal   Communication Communication Communication: No difficulties   Cognition Arousal/Alertness: Awake/alert Behavior During Therapy: WFL for tasks  assessed/performed Overall Cognitive Status: Within Functional Limits for tasks assessed                     General Comments       Exercises       Shoulder Instructions      Home Living Family/patient expects to be discharged to:: Private residence Living Arrangements: Alone                                      Prior Functioning/Environment Level of Independence: Independent        Comments: pt works full time     OT Diagnosis:     OT Problem List:     OT Treatment/Interventions:      OT Goals(Current goals can be found in the care plan section)    OT Frequency:     Barriers to D/C:            Co-evaluation  End of Session    Activity Tolerance: Patient tolerated treatment well Patient left: in chair   Time: 1791-5056 OT Time Calculation (min): 11 min Charges:  OT General Charges $OT Visit: 1 Procedure OT Evaluation $Initial OT Evaluation Tier I: 1 Procedure G-Codes:    Padraic Marinos, Ellard Artis M 09-01-2014, 1:39 PM

## 2014-08-11 NOTE — Progress Notes (Signed)
ADDENDUM:  Just received a note from Dr Beryle Beams-- he tells me he has consulted already with ID and that Dr Tommy Medal recommended Zyvox for 3 weeks, no PICC  If we can be sure patient will be able to obtain that medication she could be discharged today..She is already set up for a PET/CT 5/11 and follow-up with me a few days after that  Thank you for your care of this patient!

## 2014-08-11 NOTE — Progress Notes (Signed)
Physical Therapy Note  Physical therapist initially evaluated and discharged on 08/09/2014. Patient functioning at a high level of independence currently. Reports she has been ambulating in halls and in room independently. RN confirms. No further acute physical therapy needs indicated at this time. PT is signing-off. Please re-order if there is any significant change in status. Thank you for this referral.  Elayne Snare, [place]   08/11/2014 - 09:32 AM

## 2014-08-11 NOTE — Progress Notes (Signed)
ANTICOAGULATION CONSULT NOTE - Follow Up Consult  Pharmacy Consult for Heparin  Indication: Hx DVT  Allergies  Allergen Reactions  . Aspirin   . Percodan [Oxycodone-Aspirin] Nausea Only  . Codeine Nausea Only    Patient Measurements: Height: 5\' 2"  (157.5 cm) Weight: 135 lb (61.236 kg) IBW/kg (Calculated) : 50.1  Vital Signs: Temp: 97.6 F (36.4 C) (04/13 2131) Temp Source: Oral (04/13 2131) BP: 96/50 mmHg (04/13 2131) Pulse Rate: 72 (04/13 2131)  Labs:  Recent Labs  08/09/14 1348  08/10/14 0352 08/10/14 1002 08/10/14 2049 08/11/14 0435  HGB 10.7*  --  11.1*  --   --  10.1*  HCT 32.2*  --  34.0*  --   --  30.8*  PLT 294  --  319  --   --  339  LABPROT  --   --  21.4*  --  20.7* 20.0*  INR  --   --  1.84*  --  1.76* 1.68*  HEPARINUNFRC  --   < >  --  <0.10* 0.35 0.25*  CREATININE 0.56  --  0.66  --   --   --   < > = values in this interval not displayed.  Estimated Creatinine Clearance: 56.3 mL/min (by C-G formula based on Cr of 0.66).  Assessment: Sub-therapeutic heparin level, no issues per RN.   Goal of Therapy:  Heparin level 0.3-0.7 units/ml Monitor platelets by anticoagulation protocol: Yes   Plan:  -Increase heparin to 1350 units/hr -1300 HL -Daily CBC/HL -Monitor for bleeding  Narda Bonds 08/11/2014,5:44 AM

## 2014-08-11 NOTE — Progress Notes (Signed)
COURTESY NOTE: Agree with plan per TSU, namely antibiotic coverage and repeat scan (in 2 weeks?) to assess response.   I have requested an ID consult to help with choice of antibiotics and duration of treatment. Patient may need a PICC to receive treatment as outpatient. If all is in place by tomorrow she could be discharged then (which is what she is hoping for).  I will arrange for follow up with me in 2-4 weeks with repeat CT scan day of visit.  Please let me know if I can be of further help at this point

## 2014-08-11 NOTE — Care Management Note (Signed)
  Page 1 of 1   08/11/2014     10:49:46 AM CARE MANAGEMENT NOTE 08/11/2014  Patient:  [name], [name]   Account Number:  [id number]  Date Initiated:  08/11/2014  Documentation initiated by:  Magdalen Spatz  Subjective/Objective Assessment:     Action/Plan:   Anticipated DC Date:  08/11/2014   Anticipated DC Plan:  HOME/SELF CARE         Choice offered to / List presented to:             Status of service:   Medicare Important Message given?  YES (If response is "NO", the following Medicare IM given date fields will be blank) Date Medicare IM given:  08/11/2014 Medicare IM given by:  Magdalen Spatz Date Additional Medicare IM given:   Additional Medicare IM given by:    Discharge Disposition:  HOME/SELF CARE  Per UR Regulation:  Reviewed for med. necessity/level of care/duration of stay  If discussed at [place] of Stay Meetings, dates discussed:    Comments:  08-11-14 AJ at [facility] OP called back co pay is $40.75 and will call patient directly when ready . Patient aware. Magdalen Spatz RN BSN   08-11-14 Consult for Zyvox 600 mg PO BID , 42 tabs.  Patient uses [facility] in [place] on [address] [phone] , spoke to [place] at same co pay is $48.65 , however, he does not have medication in stock, [facility] on [address] in [place] only has 11 tabs in stock . AJ at [facility] OP has medication in stock , insurance information given prescription given over phone and faxed . AJ will confirm co pay . Patient states she can afford $48.65 . Gave [facility]inet [facility] address and phone number .  Magdalen Spatz RN BSN

## 2014-08-12 ENCOUNTER — Telehealth: Payer: Self-pay | Admitting: *Deleted

## 2014-08-12 NOTE — Telephone Encounter (Signed)
DISCHARGE SUMMARY- PT. TO SEE DR.MAGRINAT WITHIN ONE WEEK. FOLLOW UP CBC,CMP, AND ? "1/2 CAT SCAN" THIS NOTE ROUTED TO DR.MAGRINAT.

## 2014-08-15 ENCOUNTER — Other Ambulatory Visit: Payer: Self-pay | Admitting: Oncology

## 2014-08-15 ENCOUNTER — Telehealth: Payer: Self-pay | Admitting: *Deleted

## 2014-08-15 NOTE — Telephone Encounter (Signed)
TC from patient requesting follow up appt. With Dr. [name]. She had been in the hospital for 2 weeks and was discharged last week. Transferred to scheduling.

## 2014-08-15 NOTE — Telephone Encounter (Signed)
Opened in error

## 2014-08-16 ENCOUNTER — Telehealth: Payer: Self-pay | Admitting: *Deleted

## 2014-08-16 NOTE — Telephone Encounter (Signed)
This RN spoke with pt per call to [place].  [name] states she was seen at primary MD office yesterday and had labs drawn. She is scheduled again on 4/21 for lab recheck.  This RN informed pt per Dr Gerarda Fraction review recommendation for follow up in [place] is as scheduled in May with a scan prior to visit for evaluation of lung mass due to may be infectious and not metastatic deposit.  [name] understands the above and is in agreement " I do not think this is the cancer either- they checked my tumor marker and it was lower then before "  She is having ongoing nausea but able to eat and drink. AM nausea " is the worst but I get thru it ". [name] has zofran available and this RN discussed use for above including taking the zofran the night before to decrease the AM nausea.  [name] states she was advised by her primary MD at visit yesterday to avoid caffeine " and I need my coffee ". Pt tried decaffeinated coffee without benefit.  Per conversation [name] stated concern due to attempts to call this office and " the first person to answer the phone tells me the lines are busy or they hang up or disconnect the line " " when I finally got thru to the nurse at your office - she was very good and helped me all the way to the end of my concerns "  This RN informed [name] above concern would be given to appropriate management for follow up due to phone calls are sent from a central phone line off site.  Per labs this RN will obtain result from primary MD. Caffeine concern will be reviewed with Dr Jannifer Rodney.  Pt understands she will be called tomorrow with MD recommendations.

## 2014-08-16 NOTE — Telephone Encounter (Signed)
Spoke with [name] who reports "hospital discharge on 08-11-2014 with instructions to f/u with oncologist in one week with CMP, CBC-diff and CT of her lungs perhaps as she was hospitalized with pneumonia and mass in upper left lung."  Listened to her verbalize about efforts to call and no one has helped schedule F/U.  Available to come in anytime on 08-18-2014.  Will notify staff.  Return call back number is [phone].

## 2014-08-17 ENCOUNTER — Telehealth: Payer: Self-pay | Admitting: *Deleted

## 2014-08-17 NOTE — Telephone Encounter (Addendum)
Per Dr. [name], have patient restart Ibrance. Labs were faxed from [facility] in [place], [place]. I called pharmacy regarding Zyvox and caffeine. If patient has a "huge amount" of caffeine, the following symptoms can happen: increased heart rate, increased respiratory rate and sweating. I instructed patient she could have a cup of coffee in the morning and see how she feels. Patient verbalized understanding. Instructed patient to call this office if she had any other questions or concerns regarding her medications.

## 2014-08-18 ENCOUNTER — Telehealth: Payer: Self-pay | Admitting: *Deleted

## 2014-08-18 DIAGNOSIS — C50919 Malignant neoplasm of unspecified site of unspecified female breast: Secondary | ICD-10-CM

## 2014-08-18 DIAGNOSIS — C7951 Secondary malignant neoplasm of bone: Secondary | ICD-10-CM

## 2014-08-18 DIAGNOSIS — C7952 Secondary malignant neoplasm of bone marrow: Secondary | ICD-10-CM

## 2014-08-18 MED ORDER — LETROZOLE 2.5 MG PO TABS: 2.5000 mg | ORAL_TABLET | Freq: Every day | ORAL | Status: DC

## 2014-08-18 NOTE — Telephone Encounter (Signed)
This RN spoke with pt.  Informed pt MD recommendations are to HOLD Ibrance until completion of antibiotics. She is to continue the letrozole ( needs refill ) daily.  Jeffery verbalized understanding.  Note pt's INR was elevated per check at primary MD with other labs.  Dr Pleas Koch is monitoring and she has been advised to hold coumadin at present.

## 2014-08-18 NOTE — Telephone Encounter (Signed)
This RN attempted to contact pt per her discussion yesterday regarding restarting Ibrance as well as her statement " I stopped the letrozole too "  Per MD- post further review - ideally best to hold on restarting the Ibrance until completing the Zyvox.  Pt should stay on the letrozole.  Questions regarding caffeine and Zyvox answered.  This RN obtained a VM- message left to return call to this RN to clarify current plan of care.

## 2014-08-23 ENCOUNTER — Encounter: Payer: Self-pay | Admitting: Oncology

## 2014-08-23 NOTE — Progress Notes (Signed)
Pt is approved thru [employer] for Xgeva from 08/22/14 to 08/21/15 or when the benefit cap has been met. Expenses can be submitted for reimbursement for dos 05/24/14 to 08/21/15.  The amount of the grant is $5000.  Sent copy of letter to [facility] in billing.

## 2014-09-06 ENCOUNTER — Telehealth: Payer: Self-pay | Admitting: *Deleted

## 2014-09-06 ENCOUNTER — Other Ambulatory Visit: Payer: Self-pay | Admitting: Oncology

## 2014-09-06 NOTE — Telephone Encounter (Signed)
TC from patient regarding grant for [employer]. She wants to know if she will get this at her next appt.  Also, she was discharged 3 weeks ago from a 2 week hospital admission and needs to see Dr. [name] sooner than 09/27/14 as she still feels unwell-though would not give details. She is very concerned about 'cavitary mass in her lung'. Please arrange for sooner appt.

## 2014-09-07 ENCOUNTER — Ambulatory Visit (HOSPITAL_COMMUNITY)
Admission: RE | Admit: 2014-09-07 | Discharge: 2014-09-07 | Disposition: A | Discharge status: Home / Self Care | Payer: Medicare PPO | Source: Ambulatory Visit | Attending: Oncology | Admitting: Oncology

## 2014-09-07 DIAGNOSIS — Z923 Personal history of irradiation: Secondary | ICD-10-CM | POA: Insufficient documentation

## 2014-09-07 DIAGNOSIS — C50919 Malignant neoplasm of unspecified site of unspecified female breast: Secondary | ICD-10-CM | POA: Diagnosis present

## 2014-09-07 DIAGNOSIS — Z9013 Acquired absence of bilateral breasts and nipples: Secondary | ICD-10-CM | POA: Insufficient documentation

## 2014-09-07 DIAGNOSIS — M150 Primary generalized (osteo)arthritis: Secondary | ICD-10-CM | POA: Insufficient documentation

## 2014-09-07 DIAGNOSIS — E279 Disorder of adrenal gland, unspecified: Secondary | ICD-10-CM | POA: Insufficient documentation

## 2014-09-07 DIAGNOSIS — M159 Polyosteoarthritis, unspecified: Secondary | ICD-10-CM

## 2014-09-07 DIAGNOSIS — C7951 Secondary malignant neoplasm of bone: Secondary | ICD-10-CM | POA: Insufficient documentation

## 2014-09-07 DIAGNOSIS — K769 Liver disease, unspecified: Secondary | ICD-10-CM | POA: Insufficient documentation

## 2014-09-07 LAB — GLUCOSE, CAPILLARY: Glucose-Capillary: 96 mg/dL (ref 70–99)

## 2014-09-07 MED ORDER — FLUDEOXYGLUCOSE F - 18 (FDG) INJECTION
6.7000 | Freq: Once | INTRAVENOUS | Status: AC | PRN
  Administered 2014-09-07: 6.7 via INTRAVENOUS

## 2014-09-07 NOTE — Telephone Encounter (Signed)
This RN returned call to pt to discuss concerns further as well as inform her of area in lung per PET more infectious looking vs cancer.  Obtained VM- message left for pt to return call to this RN.

## 2014-09-08 ENCOUNTER — Other Ambulatory Visit: Payer: Self-pay | Admitting: *Deleted

## 2014-09-08 ENCOUNTER — Other Ambulatory Visit: Payer: Self-pay | Admitting: Internal Medicine

## 2014-09-08 NOTE — Progress Notes (Signed)
This RN spoke with [name] per her call yesterday with ongoing concerns secondary to recent lung infection and antibiotics.  [name] feels issues are not being addressed well by her local primary MD ( Dr Pleas Koch ). " he keeps referring me back to my oncologist when I have concerns saying " aren't they taking care of this "  [name] states she has told Dr Pleas Koch - " Dr [name] turned me over to you"  [name] was recently an inpt per transfer from [facility] for pulmonary issues that were thought to be possible mets under the care of Dr [name].  Per Dr [name] - pt was put on antibiotic and had PET scan yesterday for evaluation.  [name] completed the antibiotic regimen but still having symptoms.  PET shows probable infection vs mets. Pt aware of results.  At present [name] states she has continued severe am nausea, decreased appetite, ongoing diarrhea ( had negative C Diff when at [facility] ).  Plan at present is for pt to come in tomorrow for visit with midlevel and obtain IVF if needed.

## 2014-09-09 ENCOUNTER — Telehealth: Payer: Self-pay | Admitting: Nurse Practitioner

## 2014-09-09 ENCOUNTER — Other Ambulatory Visit (HOSPITAL_COMMUNITY)
Admission: RE | Admit: 2014-09-09 | Discharge: 2014-09-09 | Disposition: A | Discharge status: Home / Self Care | Payer: Medicare PPO | Source: Ambulatory Visit | Attending: Oncology | Admitting: Oncology

## 2014-09-09 ENCOUNTER — Ambulatory Visit (HOSPITAL_BASED_OUTPATIENT_CLINIC_OR_DEPARTMENT_OTHER): Payer: Medicare PPO | Admitting: Nurse Practitioner

## 2014-09-09 ENCOUNTER — Encounter: Payer: Self-pay | Admitting: Nurse Practitioner

## 2014-09-09 ENCOUNTER — Ambulatory Visit (HOSPITAL_BASED_OUTPATIENT_CLINIC_OR_DEPARTMENT_OTHER): Payer: Medicare PPO

## 2014-09-09 VITALS — BP 141/74 | HR 67

## 2014-09-09 VITALS — BP 128/69 | HR 81 | Temp 99.0°F | Resp 16 | Wt 146.7 lb

## 2014-09-09 DIAGNOSIS — C50919 Malignant neoplasm of unspecified site of unspecified female breast: Secondary | ICD-10-CM | POA: Diagnosis present

## 2014-09-09 DIAGNOSIS — Z8672 Personal history of thrombophlebitis: Secondary | ICD-10-CM

## 2014-09-09 DIAGNOSIS — C7951 Secondary malignant neoplasm of bone: Principal | ICD-10-CM

## 2014-09-09 DIAGNOSIS — E860 Dehydration: Secondary | ICD-10-CM | POA: Diagnosis not present

## 2014-09-09 DIAGNOSIS — R197 Diarrhea, unspecified: Secondary | ICD-10-CM

## 2014-09-09 DIAGNOSIS — C50511 Malignant neoplasm of lower-outer quadrant of right female breast: Secondary | ICD-10-CM | POA: Diagnosis not present

## 2014-09-09 DIAGNOSIS — C773 Secondary and unspecified malignant neoplasm of axilla and upper limb lymph nodes: Secondary | ICD-10-CM | POA: Diagnosis not present

## 2014-09-09 LAB — CLOSTRIDIUM DIFFICILE BY PCR: Toxigenic C. Difficile by PCR: NEGATIVE

## 2014-09-09 MED ORDER — SODIUM CHLORIDE 0.9 % IV SOLN
Freq: Once | INTRAVENOUS | Status: AC
  Administered 2014-09-09: 16:00:00 via INTRAVENOUS

## 2014-09-09 NOTE — Telephone Encounter (Signed)
Injection added to 5/31 per pof

## 2014-09-09 NOTE — Progress Notes (Signed)
[place]  Telephone:(336) [phone] Fax:(336) [phone]     ID: [name] OB: 1945/08/09  MR#: [id number]  ZMO#:[id number]  PCP: Curlene Labrum, MD GYN:  Gari Crown  SU:  OTHER MD: Gery Pray, Sherre Poot  CHIEF COMPLAINT: stage IV breast cancer CURRENT TREATMENT: letrozole + palbocilcib [name]  BREAST CANCER HISTORY: From Dr. Adria Dill summary 07/14/2013::  "Complex 69 year old woman with history of ER-positive and ER negative metachronous primary bilateral breast cancers.  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 Progression in bone while on tamoxifen September 2011. Initial complete response to monthly Faslodex injections.She developed a malignant left axillary lymph node with no clear primary in the left breast November 2011. This tumor was ER negative. HER-2 negative. While under evaluation for this lesion she developed a new lesion in the right breast which was ER positive. She underwent bilateral mastectomies 06/28/2010. She was continued on hormonal therapy with monthly Faslodex injections in view of the ER positive component of her tumor with a complete response to this hormone in bone. She underwent radiation to the left chest and axilla subsequent to the mastectomies. She developed a cutaneous recurrence on the right chest wall along the medial aspect of the right mastectomy scar, biopsy proven, initially detected on clinical exam by her radiation oncologist on 02/14/2011. She was treated with electron beam radiation.. 3000 cGy in 15 fractions through December 2012. Further progression in bone in August 2013. Palliative radiation to lumbosacral spine September through October 2013. Brief trial of Xeloda November 2013 through January 2014. Change to  tamoxifen plus [employer] February 2014 stopped for progression and 25 pound weight loss November 2014. Trial of Megace 80 mg twice a day with improved appetite but progressive rise in tumor marker and progressive low back pain with further progression on MRI of the spine done January 2015. Radiation to the lumbar spine February 2015. She started most recent salvage regimen with a combination of Ibrance plus Femara on 07/08/2013. It is too early to assess response."  The patient's breast cancer history is summarized in detailed below   INTERVAL HISTORY: [name] returns today for followup of her stage IV breast cancer, accompanied by a friend. She was hospitalized in early April for community acquired pneumonia that was positive for MRSA. She was discharged on 4/14 with a 21 day course of Zyvox. During this time her [name] was to be held, but she was supposed to continue the letrozole. She was mistaken and stopped both drugs, and wants clarification on this now that she has finished her antibiotics. During her admission, a CT scan showed a cavitary lesion in the LUL. A repeat PET scan this week showed the lesion to be resolving, thus continuing the theory that this was likely an infectious, not metastatic, finding.   REVIEW OF SYSTEMS: Zakariah denies fevers, chills, nausea, or vomiting. What has persisted for the past month is diarrhea. She takes imodium once daily and is having about 4-5 BMs daily. She tested negative for c.diff in the hospital. She denies cramping and abdominal pain. She claims to be perpetually thirsty and feels dehydrated. She is not urinating as often. She is here for IV fluids today. She is breathing better but she still has shortness of breath with exertion, a rare dry cough now. She is ready to go back to work. Her appetite is  decreased. She stopped her fosamax because it was making her sick. A detailed reivew of systems is otherwise stable.    PAST MEDICAL HISTORY: Past Medical  History  Diagnosis Date  . Cancer     RBreast, Lower back , Under l Arm  . Hypertension   . GERD (gastroesophageal reflux disease)   . Breast cancer metastasized to bone 04/30/2011  . Radiation 01/15/2012    Lumbosacral spine 3500 cGy 14  fx  . Swollen R ankle 08/10/2012  . Fracture of ankle, medial malleolus, right, closed 08/10/2012  . Tubular adenoma 2011  . Hiatal hernia 2011  . Schatzki's ring   . Anorexia 03/02/2013  . Weight loss due to medication 03/02/2013    PAST SURGICAL HISTORY: Past Surgical History  Procedure Laterality Date  . Gallbladder surgery  1991  . Mastectomy  06/28/10    bilateral- Dr. Rosebud Poles, [place]  . Inguinal hernia repair  90's    right  . Parathyroidectomy  90's  . Carpal tunnel release  90's  . Oophorectomy  90's    right  . Lymph node dissection    . Breast surgery    . Cholecystectomy    . Colonoscopy  01/08/10    Dr. [name]- anal tag,hemorrhoid o/w normal rectum, pancolonic diverticula, diminutive polyp in the base of the cecum= tubular adenoma on bx. poor prep  . Esophagogastroduodenoscopy  01/08/10    Dr. [name]- normal esophagus, small hiatal hernia, antum and body erosions, pedunculated polyp between D1 and D2, duodenal diverticulum, lymphangiectasia, second portion of the duodenum.stomach bx= inflammation, duodenum bx= adenoma  . Rotator cuff repair  08/27/11    Dr. Alphonzo Cruise- [facility]- [place] [facility]  . Esophagogastroduodenoscopy (egd) with propofol N/A 11/19/2012    RMR: non-critical Schatzki's ring s/p dilation with 9 F, multiple gastric polyps, duodenal polyp s/p piecemeal snare polypectomy, gastric polypectomy, path benign  . Maloney dilation N/A 11/19/2012    Procedure: Venia Minks DILATION;  Surgeon: Daneil Dolin, MD;  Location: [facility];  Service: Endoscopy;  Laterality: N/A;  #[id number]  . Polypectomy N/A 11/19/2012    Procedure: POLYPECTOMY;  Surgeon: Daneil Dolin, MD;  Location: [facility];  Service: Endoscopy;  Laterality: N/A;  duodenal  . Esophageal  biopsy N/A 11/19/2012    Procedure: BIOPSY;  Surgeon: Daneil Dolin, MD;  Location: [facility];  Service: Endoscopy;  Laterality: N/A;  esophageal    FAMILY HISTORY Family History  Problem Relation Age of Onset  . Cancer Mother     Colon  . Cancer Father     Lung, Mouth,leg   the patient's father died at the age of 60 from metastatic head and neck carcinoma. The patient's mother died of breast cancer the age of 34. The patient had 2 brothers, both with prostate cancer. She has 4 sisters, one diagnosed with breast cancer at the age of 60, another 1 recent underwent bilateral mastectomies. Another one died from colon cancer.   GYNECOLOGIC HISTORY:   menarche age 34, she is GX P0. She underwent menopause at age 13, status post HRT for 12 years. She status post right salpingo-oophorectomy.   SOCIAL HISTORY:  She works at [employer] in [place]. Her husband died at [facility] [place] from complications of surgery after a 7-1/2 month hospitalization) 2008). She lives by herself, with no pets. She tells me she works 9 hours a day 5 days a week.     ADVANCED DIRECTIVES: Not in place   HEALTH MAINTENANCE: History  Substance Use  Topics  . Smoking status: Former Smoker    Quit date: 11/22/1982  . Smokeless tobacco: Never Used     Comment: FQuit smoking x 32 years  . Alcohol Use: No     Colonoscopy:  PAP:  Bone density:  Lipid panel:  Allergies  Allergen Reactions  . Aspirin   . Percodan [Oxycodone-Aspirin] Nausea Only  . Codeine Nausea Only    Current Outpatient Prescriptions  Medication Sig Dispense Refill  . HYDROcodone-acetaminophen (NORCO) 10-325 MG per tablet Take 1-2 tablets by mouth every 4 (four) hours as needed for moderate pain or severe pain. 360 tablet 0  . lisinopril (PRINIVIL,ZESTRIL) 20 MG tablet   3  . mometasone (ELOCON) 0.1 % lotion Apply topically daily as needed. Use as directed 30 mL 5  . ondansetron (ZOFRAN-ODT) 8 MG disintegrating tablet DISSOLVE  1 TABLET ON THE TONGUE EVERY 8 HOURS AS NEEDED FOR NAUSEA 20 tablet 0  . pantoprazole (PROTONIX) 40 MG tablet Take 1 tablet (40 mg total) by mouth daily. 30 tablet 0  . Polyethyl Glycol-Propyl Glycol (SYSTANE OP) Apply 1 drop to eye daily as needed (dry eyes).    . polyethylene glycol (MIRALAX / GLYCOLAX) packet Take 17 g by mouth daily as needed for mild constipation. 14 each 0  . warfarin (COUMADIN) 4 MG tablet Take 0.5-1 tablets (2-4 mg total) by mouth daily. 597m on Mon.,Wed.,Fri., and 49mall other days (Patient taking differently: Take 2-4 mg by mouth daily. 97m697mn Sun, Fri., and 2mg62ml other days) 90 tablet 4  . acetaminophen (TYLENOL) 325 MG tablet Take 2 tablets (650 mg total) by mouth every 6 (six) hours as needed for mild pain or moderate pain (headache). (Patient not taking: Reported on 09/09/2014) 60 tablet 0  . alendronate (FOSAMAX) 70 MG tablet Take 1 tablet (70 mg total) by mouth once a week. Take with a full glass of water on an empty stomach. (Patient not taking: Reported on 08/09/2014) 15 tablet 4  . dextromethorphan (DELSYM) 30 MG/5ML liquid Take 5 mLs (30 mg total) by mouth 2 (two) times daily as needed for cough. (Patient not taking: Reported on 09/09/2014) 89 mL 0  . letrozole (FEMARA) 2.5 MG tablet Take 1 tablet (2.5 mg total) by mouth daily. (Patient not taking: Reported on 09/09/2014) 90 tablet 4  . menthol-cetylpyridinium (CEPACOL) 3 MG lozenge Take 1 lozenge (3 mg total) by mouth as needed for sore throat. (Patient not taking: Reported on 09/09/2014) 100 tablet 12  . palbociclib (IBRANCE) 125 MG capsule Take 1 capsule (125 mg total) by mouth daily. Take 21days out of 28 with Letrozole, with food (Patient not taking: Reported on 09/09/2014) 21 capsule 3   No current facility-administered medications for this visit.    OBJECTIVE: Middle-aged white woman in no acute distress Filed Vitals:   09/09/14 1500  BP: 128/69  Pulse: 81  Temp: 99 F (37.2 C)  Resp: 16     Body mass  index is 26.83 kg/(m^2).    ECOG FS:1 - Symptomatic but completely ambulatory Filed Vitals:   09/09/14 1500  BP: 128/69  Pulse: 81  Temp: 99 F (37.2 C)  Resp: 16   Skin: warm, dry  HEENT: sclerae anicteric, conjunctivae pink, oropharynx clear. No thrush or mucositis.  Lymph Nodes: No cervical or supraclavicular lymphadenopathy  Lungs: clear to auscultation bilaterally, no rales, wheezes, or rhonci  Heart: regular rate and rhythm  Abdomen: round, soft, non tender, positive bowel sounds  Musculoskeletal: No focal spinal tenderness, no  peripheral edema  Neuro: non focal, well oriented, positive affect  Breasts: deferred  LAB RESULTS: Outside labs reviewed.No components found for: LABCA125 CBC    Component Value Date/Time   WBC 6.0 08/11/2014 0435   WBC 3.8* 06/21/2014 1124   RBC 2.99* 08/11/2014 0435   RBC 3.75 06/21/2014 1124   HGB 10.1* 08/11/2014 0435   HGB 13.3 06/21/2014 1124   HCT 30.8* 08/11/2014 0435   HCT 38.8 06/21/2014 1124   PLT 339 08/11/2014 0435   PLT 302 06/21/2014 1124   MCV 103.0* 08/11/2014 0435   MCV 103.5* 06/21/2014 1124   MCH 33.8 08/11/2014 0435   MCH 35.5* 06/21/2014 1124   MCHC 32.8 08/11/2014 0435   MCHC 34.3 06/21/2014 1124   RDW 13.6 08/11/2014 0435   RDW 13.5 06/21/2014 1124   LYMPHSABS 0.3* 06/21/2014 1124   LYMPHSABS 1.0 06/27/2010 1558   MONOABS 0.3 06/21/2014 1124   MONOABS 1.0 06/27/2010 1558   EOSABS 0.0 06/21/2014 1124   EOSABS 0.2 06/27/2010 1558   BASOSABS 0.1 06/21/2014 1124   BASOSABS 0.0 06/27/2010 1558     No results for input(s): INR in the last 168 hours.  Urinalysis    Component Value Date/Time   COLORURINE YELLOW 06/28/2010 0856   APPEARANCEUR CLEAR 06/28/2010 0856   LABSPEC 1.024 06/28/2010 0856   PHURINE 6.0 06/28/2010 0856   HGBUR NEGATIVE 06/28/2010 0856   BILIRUBINUR NEGATIVE 06/28/2010 0856   KETONESUR NEGATIVE 06/28/2010 0856   PROTEINUR NEGATIVE 06/28/2010 0856   UROBILINOGEN 0.2 06/28/2010 0856    NITRITE NEGATIVE 06/28/2010 0856   LEUKOCYTESUR  06/28/2010 0856    NEGATIVE MICROSCOPIC NOT DONE ON URINES WITH NEGATIVE PROTEIN, BLOOD, LEUKOCYTES, NITRITE, OR GLUCOSE <1000 mg/dL.    STUDIES: Nm Pet Image Restag (ps) Skull Base To Thigh  09/07/2014   CLINICAL DATA:  Subsequent treatment strategy for breast cancer metastasized to bone.  EXAM: NUCLEAR MEDICINE PET SKULL BASE TO THIGH  TECHNIQUE: 6.7 mCi F-18 FDG was injected intravenously. Full-ring PET imaging was performed from the skull base to thigh after the radiotracer. CT data was obtained and used for attenuation correction and anatomic localization.  FASTING BLOOD GLUCOSE:  Value: 96 mg/dl  COMPARISON:  PET-CT 03/14/2014 and 12/10/2013. Chest CT 09/02/2014 and 08/05/2014.  FINDINGS: NECK  No hypermetabolic cervical lymph nodes are identified.There are no lesions of the pharyngeal mucosal space. Possible previous right thyroid resection.  CHEST  There are no hypermetabolic mediastinal, hilar or axillary lymph nodes. Stable postsurgical changes status post bilateral mastectomy and axillary node dissection. There is no abnormal chest wall activity. Extensive biapical subpleural scarring is again noted secondary to prior radiation therapy. At the site of the cavitary left upper lobe process demonstrated on CT 1 month ago, there is further contraction, now measuring 1.9 x 2.8 cm on image 17. This has associated air bronchograms and hypermetabolic activity with an SUV max of 6.0. No other abnormal pulmonary metabolic activity.  ABDOMEN/PELVIS  There is no hypermetabolic activity within the liver, adrenal glands, spleen or pancreas. There is no hypermetabolic nodal activity. There are stable cystic lesions within the liver and stable extrahepatic biliary dilatation status post cholecystectomy. There is a stable 11 mm left adrenal nodule without abnormal metabolic activity.  SKELETON  Largely treated osseous metastatic disease appears stable with multiple  lytic and blastic lesions in the spine and pelvis. Within the proximal left femoral diaphysis, there is mildly increased metabolic activity compared with the prior study (SUV max 3.6; previously 2.1). No focal  lytic lesion or impending fracture identified in this area.  IMPRESSION: 1. Low-level hypermetabolic activity within the improving left upper lobe process, no longer cavitary. This most likely represents resolving infection, although could be related to evolving post radiation necrosis. 2. No definite extra osseous metastatic disease. 3. Largely treated osseous metastatic disease. There is mildly increased activity within the proximal left femoral diaphysis without evidence of impending pathologic fracture.   Electronically Signed   By: Richardean Sale M.D.   On: 09/07/2014 13:16    ASSESSMENT: 69 y.o. [place], [place] woman status post right lumpectomy and axillary lymph node dissection December 2006 for a [place] N2, stage IIIA invasive ductal carcinoma, estrogen and progesterone receptor positive, with subsequent stage IV disease  (1) received adjuvant doxorubicin, docetaxel and cyclophosphamide  (2) status post adjuvant radiation  (3) on tamoxifen to September 2011 when she was noted to have metastatic disease to bone  (4) fulvestrant 02/06/2009 to 01/15/2011  (5) left axillary lymph node positive for an invasive ductal carcinoma, estrogen receptor and HER-2 negative November 2011, and a right breast recurrence for an estrogen receptor positive lesion at the same time.  (6) status post bilateral mastectomies 06/28/2010  (7) status post left chest and axillary radiation  (8) status post right chest wall recurrence October of 2012, treated with electron beam radiation completed December 2012  (9) progression in bone August 2013,  (10) status post radiation to the lumbosacral spine completed October 2013   (11) capecitabine November 2013 through January 2014  (12) tamoxifen/ everolimus February  2014, discontinued November 2014 with progression  (13) Megace 80 mg twice daily with progression January 2015  (14) Radiation to lumbar spine February 2015  (15) letrozole plus Ibrance started 07/08/2013  (16). Declines genetic testing (as of 08/17/2013)  (17) zolendronate 05/01/2009, discontinued because of pain; Xgeva started 10/08/2013, discontinued because of cost issues as of November 2015, switched to alendronate March 2016, switched back to [place] in May 2016  OTHER ACTIVE PROBLEMS:  (1) chronic GI complaints, followed by Dr. Sydell Axon  (2) multiple orthopedic problems, including bilateral chronic foot pain, status post rotator cuff repair  (3) history of extensive superficial phlebitis, on chronic low dose Coumadin.  PLAN: Danene has improved since her hospitalization, but feels she still has some room for improvement. She will have IV fluids this afternoon, and will make a better effort to stay orally hydrated at home. For her diarrhea, I suggested she increase the frequency of her imodium use before adding more meds to her list. Certainly we can try questran or lomitil in the future. In addition, she may want to try probiotics. I'm sure she was on harsh IV antibiotic while admitted, that might have depleted her gastrointestinal flora. I am asking her to provide a repeat c.diff sample to rule this out.  Since she has finished the Zyvox, she will restart the ibrance and letrozole. She has found funding for the denosumab, and plans to return to doing these injections over the alendronate which gave her bad side effects. She will have her next injection with her next office visit on 5/31.   Danna will return in 2 weeks for labs and a follow up visit with Dr. [name]. She understands and agrees with this plan. She knows the goal of treatment in her case is control. She has been encouraged to call with any issues that might arise before her next visit here.   Laurie Panda, NP    09/09/2014 3:51 PM

## 2014-09-09 NOTE — Patient Instructions (Signed)
Dehydration, Adult Dehydration is when you lose more fluids from the body than you take in. Vital organs like the kidneys, brain, and heart cannot function without a proper amount of fluids and salt. Any loss of fluids from the body can cause dehydration.  CAUSES   Vomiting.  Diarrhea.  Excessive sweating.  Excessive urine output.  Fever. SYMPTOMS  Mild dehydration  Thirst.  Dry lips.  Slightly dry mouth. Moderate dehydration  Very dry mouth.  Sunken eyes.  Skin does not bounce back quickly when lightly pinched and released.  Dark urine and decreased urine production.  Decreased tear production.  Headache. Severe dehydration  Very dry mouth.  Extreme thirst.  Rapid, weak pulse (more than 100 beats per minute at rest).  Cold hands and feet.  Not able to sweat in spite of heat and temperature.  Rapid breathing.  Blue lips.  Confusion and lethargy.  Difficulty being awakened.  Minimal urine production.  No tears. DIAGNOSIS  Your caregiver will diagnose dehydration based on your symptoms and your exam. Blood and urine tests will help confirm the diagnosis. The diagnostic evaluation should also identify the cause of dehydration. TREATMENT  Treatment of mild or moderate dehydration can often be done at home by increasing the amount of fluids that you drink. It is best to drink small amounts of fluid more often. Drinking too much at one time can make vomiting worse. Refer to the home care instructions below. Severe dehydration needs to be treated at the hospital where you will probably be given intravenous (IV) fluids that contain water and electrolytes. HOME CARE INSTRUCTIONS   Ask your caregiver about specific rehydration instructions.  Drink enough fluids to keep your urine clear or pale yellow.  Drink small amounts frequently if you have nausea and vomiting.  Eat as you normally do.  Avoid:  Foods or drinks high in sugar.  Carbonated  drinks.  Juice.  Extremely hot or cold fluids.  Drinks with caffeine.  Fatty, greasy foods.  Alcohol.  Tobacco.  Overeating.  Gelatin desserts.  Wash your hands well to avoid spreading bacteria and viruses.  Only take over-the-counter or prescription medicines for pain, discomfort, or fever as directed by your caregiver.  Ask your caregiver if you should continue all prescribed and over-the-counter medicines.  Keep all follow-up appointments with your caregiver. SEEK MEDICAL CARE IF:  You have abdominal pain and it increases or stays in one area (localizes).  You have a rash, stiff neck, or severe headache.  You are irritable, sleepy, or difficult to awaken.  You are weak, dizzy, or extremely thirsty. SEEK IMMEDIATE MEDICAL CARE IF:   You are unable to keep fluids down or you get worse despite treatment.  You have frequent episodes of vomiting or diarrhea.  You have blood or green matter (bile) in your vomit.  You have blood in your stool or your stool looks black and tarry.  You have not urinated in 6 to 8 hours, or you have only urinated a small amount of very dark urine.  You have a fever.  You faint. MAKE SURE YOU:   Understand these instructions.  Will watch your condition.  Will get help right away if you are not doing well or get worse. Document Released: 04/15/2005 Document Revised: 07/08/2011 Document Reviewed: 12/03/2010 Exit[employer] Patient Information 2015 [employer]. This information is not intended to replace advice given to you by your health care provider. Make sure you discuss any questions you have with your health care   provider.  

## 2014-09-12 ENCOUNTER — Telehealth: Payer: Self-pay | Admitting: *Deleted

## 2014-09-12 NOTE — Telephone Encounter (Signed)
Patient Access Network Form brought in by patient.  RN reviewed and taken to managed care.

## 2014-09-27 ENCOUNTER — Other Ambulatory Visit: Payer: Self-pay | Admitting: Oncology

## 2014-09-27 ENCOUNTER — Ambulatory Visit (HOSPITAL_BASED_OUTPATIENT_CLINIC_OR_DEPARTMENT_OTHER): Payer: Medicare PPO | Admitting: Oncology

## 2014-09-27 ENCOUNTER — Other Ambulatory Visit (HOSPITAL_BASED_OUTPATIENT_CLINIC_OR_DEPARTMENT_OTHER): Payer: Medicare PPO

## 2014-09-27 ENCOUNTER — Ambulatory Visit (HOSPITAL_BASED_OUTPATIENT_CLINIC_OR_DEPARTMENT_OTHER): Payer: Medicare PPO

## 2014-09-27 ENCOUNTER — Ambulatory Visit (HOSPITAL_COMMUNITY)
Admission: RE | Admit: 2014-09-27 | Discharge: 2014-09-27 | Disposition: A | Discharge status: Home / Self Care | Payer: Medicare PPO | Source: Ambulatory Visit | Attending: Oncology | Admitting: Oncology

## 2014-09-27 VITALS — BP 141/70 | HR 70 | Temp 98.1°F | Resp 18 | Ht 62.0 in | Wt 141.7 lb

## 2014-09-27 DIAGNOSIS — Z8672 Personal history of thrombophlebitis: Secondary | ICD-10-CM

## 2014-09-27 DIAGNOSIS — C50511 Malignant neoplasm of lower-outer quadrant of right female breast: Secondary | ICD-10-CM

## 2014-09-27 DIAGNOSIS — C50919 Malignant neoplasm of unspecified site of unspecified female breast: Secondary | ICD-10-CM

## 2014-09-27 DIAGNOSIS — R918 Other nonspecific abnormal finding of lung field: Secondary | ICD-10-CM | POA: Insufficient documentation

## 2014-09-27 DIAGNOSIS — R948 Abnormal results of function studies of other organs and systems: Secondary | ICD-10-CM | POA: Diagnosis present

## 2014-09-27 DIAGNOSIS — M150 Primary generalized (osteo)arthritis: Secondary | ICD-10-CM

## 2014-09-27 DIAGNOSIS — M159 Polyosteoarthritis, unspecified: Secondary | ICD-10-CM

## 2014-09-27 DIAGNOSIS — C7951 Secondary malignant neoplasm of bone: Secondary | ICD-10-CM

## 2014-09-27 DIAGNOSIS — C50911 Malignant neoplasm of unspecified site of right female breast: Secondary | ICD-10-CM

## 2014-09-27 DIAGNOSIS — Z79811 Long term (current) use of aromatase inhibitors: Secondary | ICD-10-CM | POA: Diagnosis not present

## 2014-09-27 DIAGNOSIS — J984 Other disorders of lung: Secondary | ICD-10-CM

## 2014-09-27 LAB — CBC WITH DIFFERENTIAL/PLATELET
BASO%: 2.5 % — ABNORMAL HIGH (ref 0.0–2.0)
BASOS ABS: 0.1 10*3/uL (ref 0.0–0.1)
EOS ABS: 0 10*3/uL (ref 0.0–0.5)
EOS%: 1.7 % (ref 0.0–7.0)
HCT: 32.6 % — ABNORMAL LOW (ref 34.8–46.6)
HGB: 10.8 g/dL — ABNORMAL LOW (ref 11.6–15.9)
LYMPH%: 17.6 % (ref 14.0–49.7)
MCH: 32.8 pg (ref 25.1–34.0)
MCHC: 33.1 g/dL (ref 31.5–36.0)
MCV: 99.1 fL (ref 79.5–101.0)
MONO#: 0.2 10*3/uL (ref 0.1–0.9)
MONO%: 7.5 % (ref 0.0–14.0)
NEUT%: 70.7 % (ref 38.4–76.8)
NEUTROS ABS: 1.7 10*3/uL (ref 1.5–6.5)
PLATELETS: 105 10*3/uL — AB (ref 145–400)
RBC: 3.29 10*6/uL — ABNORMAL LOW (ref 3.70–5.45)
RDW: 15.6 % — ABNORMAL HIGH (ref 11.2–14.5)
WBC: 2.4 10*3/uL — ABNORMAL LOW (ref 3.9–10.3)
lymph#: 0.4 10*3/uL — ABNORMAL LOW (ref 0.9–3.3)
nRBC: 0 % (ref 0–0)

## 2014-09-27 LAB — COMPREHENSIVE METABOLIC PANEL (CC13)
ALT: 7 U/L (ref 0–55)
AST: 16 U/L (ref 5–34)
Albumin: 3.3 g/dL — ABNORMAL LOW (ref 3.5–5.0)
Alkaline Phosphatase: 55 U/L (ref 40–150)
Anion Gap: 7 mEq/L (ref 3–11)
BILIRUBIN TOTAL: 0.61 mg/dL (ref 0.20–1.20)
BUN: 13.6 mg/dL (ref 7.0–26.0)
CO2: 27 mEq/L (ref 22–29)
Calcium: 8.1 mg/dL — ABNORMAL LOW (ref 8.4–10.4)
Chloride: 109 mEq/L (ref 98–109)
Creatinine: 0.7 mg/dL (ref 0.6–1.1)
EGFR: 86 mL/min/{1.73_m2} — AB (ref >90)
GLUCOSE: 100 mg/dL (ref 70–140)
POTASSIUM: 4.1 meq/L (ref 3.5–5.1)
Sodium: 143 mEq/L (ref 136–145)
Total Protein: 6.4 g/dL (ref 6.4–8.3)

## 2014-09-27 MED ORDER — DENOSUMAB 120 MG/1.7ML ~~LOC~~ SOLN
120.0000 mg | Freq: Once | SUBCUTANEOUS | Status: AC
  Administered 2014-09-27: 120 mg via SUBCUTANEOUS
  Filled 2014-09-27: qty 1.7

## 2014-09-27 MED ORDER — WARFARIN SODIUM 4 MG PO TABS: ORAL_TABLET | ORAL | Status: DC

## 2014-09-27 NOTE — Patient Instructions (Signed)
Denosumab injection What is this medicine? DENOSUMAB (den oh sue mab) slows bone breakdown. Prolia is used to treat osteoporosis in women after menopause and in men. Xgeva is used to prevent bone fractures and other bone problems caused by cancer bone metastases. Xgeva is also used to treat giant cell tumor of the bone. This medicine may be used for other purposes; ask your health care provider or pharmacist if you have questions. COMMON BRAND NAME(S): Prolia, XGEVA What should I tell my health care provider before I take this medicine? They need to know if you have any of these conditions: -dental disease -eczema -infection or history of infections -kidney disease or on dialysis -low blood calcium or vitamin D -malabsorption syndrome -scheduled to have surgery or tooth extraction -taking medicine that contains denosumab -thyroid or parathyroid disease -an unusual reaction to denosumab, other medicines, foods, dyes, or preservatives -pregnant or trying to get pregnant -breast-feeding How should I use this medicine? This medicine is for injection under the skin. It is given by a health care professional in a hospital or clinic setting. If you are getting Prolia, a special MedGuide will be given to you by the pharmacist with each prescription and refill. Be sure to read this information carefully each time. For Prolia, talk to your pediatrician regarding the use of this medicine in children. Special care may be needed. For Xgeva, talk to your pediatrician regarding the use of this medicine in children. While this drug may be prescribed for children as young as 13 years for selected conditions, precautions do apply. Overdosage: If you think you've taken too much of this medicine contact a poison control center or emergency room at once. Overdosage: If you think you have taken too much of this medicine contact a poison control center or emergency room at once. NOTE: This medicine is only for  you. Do not share this medicine with others. What if I miss a dose? It is important not to miss your dose. Call your doctor or health care professional if you are unable to keep an appointment. What may interact with this medicine? Do not take this medicine with any of the following medications: -other medicines containing denosumab This medicine may also interact with the following medications: -medicines that suppress the immune system -medicines that treat cancer -steroid medicines like prednisone or cortisone This list may not describe all possible interactions. Give your health care provider a list of all the medicines, herbs, non-prescription drugs, or dietary supplements you use. Also tell them if you smoke, drink alcohol, or use illegal drugs. Some items may interact with your medicine. What should I watch for while using this medicine? Visit your doctor or health care professional for regular checks on your progress. Your doctor or health care professional may order blood tests and other tests to see how you are doing. Call your doctor or health care professional if you get a cold or other infection while receiving this medicine. Do not treat yourself. This medicine may decrease your body's ability to fight infection. You should make sure you get enough calcium and vitamin D while you are taking this medicine, unless your doctor tells you not to. Discuss the foods you eat and the vitamins you take with your health care professional. See your dentist regularly. Brush and floss your teeth as directed. Before you have any dental work done, tell your dentist you are receiving this medicine. Do not become pregnant while taking this medicine or for 5 months after stopping   it. Women should inform their doctor if they wish to become pregnant or think they might be pregnant. There is a potential for serious side effects to an unborn child. Talk to your health care professional or pharmacist for more  information. What side effects may I notice from receiving this medicine? Side effects that you should report to your doctor or health care professional as soon as possible: -allergic reactions like skin rash, itching or hives, swelling of the face, lips, or tongue -breathing problems -chest pain -fast, irregular heartbeat -feeling faint or lightheaded, falls -fever, chills, or any other sign of infection -muscle spasms, tightening, or twitches -numbness or tingling -skin blisters or bumps, or is dry, peels, or red -slow healing or unexplained pain in the mouth or jaw -unusual bleeding or bruising Side effects that usually do not require medical attention (Report these to your doctor or health care professional if they continue or are bothersome.): -muscle pain -stomach upset, gas This list may not describe all possible side effects. Call your doctor for medical advice about side effects. You may report side effects to FDA at 1-800-FDA-1088. Where should I keep my medicine? This medicine is only given in a clinic, doctor's office, or other health care setting and will not be stored at home. NOTE: This sheet is a summary. It may not cover all possible information. If you have questions about this medicine, talk to your doctor, pharmacist, or health care provider.  2015, Elsevier/Gold Standard. (2011-10-14 12:37:47)  

## 2014-09-27 NOTE — Progress Notes (Signed)
[place]  Telephone:([phone]) [phone] Fax:([phone]) [phone]     ID: [name] OB: 01-06-1946  MR#: [id number]  EXN#:[id number]  PCP: Curlene Labrum, MD GYN:  Gari Crown  SU:  OTHER MD: Gery Pray, Sherre Poot  CHIEF COMPLAINT: stage IV breast cancer  CURRENT TREATMENT: letrozole + palbocilcib [name]  BREAST CANCER HISTORY: From Dr. Adria Dill summary 07/14/2013::  "Complex 69 year old [name] with history of ER-positive and ER negative metachronous primary bilateral breast cancers.  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 Progression in bone while on tamoxifen September 2011. Initial complete response to monthly Faslodex injections.She developed a malignant left axillary lymph node with no clear primary in the left breast November 2011. This tumor was ER negative. HER-2 negative. While under evaluation for this lesion she developed a Carolyn lesion in the right breast which was ER positive. She underwent bilateral mastectomies 06/28/2010. She was continued on hormonal therapy with monthly Faslodex injections in view of the ER positive component of her tumor with a complete response to this hormone in bone. She underwent radiation to the left chest and axilla subsequent to the mastectomies. She developed a cutaneous recurrence on the right chest wall along the medial aspect of the right mastectomy scar, biopsy proven, initially detected on clinical exam by her radiation oncologist on 02/14/2011. She was treated with electron beam radiation.. 3000 cGy in 15 fractions through December 2012. Further progression in bone in August 2013. Palliative radiation to lumbosacral spine September through October 2013. Brief trial of Xeloda November 2013 through January 2014. Change to  tamoxifen plus [employer] February 2014 stopped for progression and 25 pound weight loss November 2014. Trial of Megace 80 mg twice a day with improved appetite but progressive rise in tumor marker and progressive low back pain with further progression on MRI of the spine done January 2015. Radiation to the lumbar spine February 2015. She started most recent salvage regimen with a combination of Ibrance plus Femara on 07/08/2013. It is too early to assess response."  The patient's breast cancer history is summarized in detailed below   INTERVAL HISTORY: [name] returns today for followup of her stage IV breast cancer. To recap, she developed a left upper lobe cavitary lesion with sputum at [facility] showing this to be positive for MRSA. The organism was sensitive to Zosyn and doxycycline and the patient received Zosyn for 3 weeks, with significant improvement. She is here today to receive a dose of 10 also lab and 2 assess whether she is ready to resume work  REVIEW OF SYSTEMS: [name] she is finally "back to baseline". She still has some chills at times, and frequently feels cold. She has problems with insomnia. She describes herself is moderately fatigued. She still has a bit of a runny nose, but no cough or phlegm production. She denies fevers. She denies shortness of breath. She had some diarrhea problems and she had stool sent for C. Difficile 2, both negative. This is now much improved. She has some stress urinary incontinence. She feels nauseated at times, and doesn't want to eat. However during today's visit she was ravenous and 8 quite a bit of cheese and crackers, which unfortunately was all we could offer her. She complains of joint pains here and there but these are not more persistent or intense than  Her usual. She  feels forgetful, but not anxious or depressed. A detailed review of systems today was otherwise noncontributory  PAST MEDICAL HISTORY: Past Medical History    Diagnosis Date  . Cancer     RBreast, Lower back , Under l Arm  . Hypertension   . GERD (gastroesophageal reflux disease)   . Breast cancer metastasized to bone 04/30/2011  . Radiation 01/15/2012    Lumbosacral spine 3500 cGy 14  fx  . Swollen R ankle 08/10/2012  . Fracture of ankle, medial malleolus, right, closed 08/10/2012  . Tubular adenoma 2011  . Hiatal hernia 2011  . Schatzki's ring   . Anorexia 03/02/2013  . Weight loss due to medication 03/02/2013    PAST SURGICAL HISTORY: Past Surgical History  Procedure Laterality Date  . Gallbladder surgery  1991  . Mastectomy  06/28/10    bilateral- Dr. Rosebud Poles, [place]  . Inguinal hernia repair  90's    right  . Parathyroidectomy  90's  . Carpal tunnel release  90's  . Oophorectomy  90's    right  . Lymph node dissection    . Breast surgery    . Cholecystectomy    . Colonoscopy  01/08/10    Dr. [name]- anal tag,hemorrhoid o/w normal rectum, pancolonic diverticula, diminutive polyp in the base of the cecum= tubular adenoma on bx. poor prep  . Esophagogastroduodenoscopy  01/08/10    Dr. [name]- normal esophagus, small hiatal hernia, antum and body erosions, pedunculated polyp between D1 and D2, duodenal diverticulum, lymphangiectasia, second portion of the duodenum.stomach bx= inflammation, duodenum bx= adenoma  . Rotator cuff repair  08/27/11    Dr. Alphonzo Cruise- [facility]- [place] [facility]  . Esophagogastroduodenoscopy (egd) with propofol N/A 11/19/2012    RMR: non-critical Schatzki's ring s/p dilation with 85 F, multiple gastric polyps, duodenal polyp s/p piecemeal snare polypectomy, gastric polypectomy, path benign  . Maloney dilation N/A 11/19/2012    Procedure: Venia Minks DILATION;  Surgeon: Daneil Dolin, MD;  Location: [facility];  Service: Endoscopy;  Laterality: N/A;  #[id number]  . Polypectomy N/A 11/19/2012    Procedure: POLYPECTOMY;  Surgeon: Daneil Dolin, MD;  Location: [facility];  Service: Endoscopy;  Laterality: N/A;  duodenal  . Esophageal biopsy N/A  11/19/2012    Procedure: BIOPSY;  Surgeon: Daneil Dolin, MD;  Location: [facility];  Service: Endoscopy;  Laterality: N/A;  esophageal    FAMILY HISTORY Family History  Problem Relation Age of Onset  . Cancer Mother     Colon  . Cancer Father     Lung, Mouth,leg   the patient's father died at the age of 87 from metastatic head and neck carcinoma. The patient's mother died of breast cancer the age of 68. The patient had 2 brothers, both with prostate cancer. She has 4 sisters, one diagnosed with breast cancer at the age of 78, another 1 recent underwent bilateral mastectomies. Another one died from colon cancer.   GYNECOLOGIC HISTORY:   menarche age 58, she is GX P0. She underwent menopause at age 9, status post HRT for 12 years. She status post right salpingo-oophorectomy.   SOCIAL HISTORY:  She works at [employer] in [place]. Her husband died at [employer] [place] from complications of surgery after a 7-1/2 month hospitalization) 2008). She lives by herself, with no pets. She tells me she works 9 hours a day 5 days a week.     ADVANCED DIRECTIVES: In place   HEALTH MAINTENANCE: History  Substance Use Topics  . Smoking status:  Former Smoker    Quit date: 11/22/1982  . Smokeless tobacco: Never Used     Comment: FQuit smoking x 32 years  . Alcohol Use: No     Colonoscopy:  PAP:  Bone density:  Lipid panel:  Allergies  Allergen Reactions  . Aspirin   . Percodan [Oxycodone-Aspirin] Nausea Only  . Codeine Nausea Only    Current Outpatient Prescriptions  Medication Sig Dispense Refill  . acetaminophen (TYLENOL) 325 MG tablet Take 2 tablets (650 mg total) by mouth every 6 (six) hours as needed for mild pain or moderate pain (headache). (Patient not taking: Reported on 09/09/2014) 60 tablet 0  . alendronate (FOSAMAX) 70 MG tablet Take 1 tablet (70 mg total) by mouth once a week. Take with a full glass of water on an empty stomach. (Patient not taking: Reported on  08/09/2014) 15 tablet 4  . dextromethorphan (DELSYM) 30 MG/5ML liquid Take 5 mLs (30 mg total) by mouth 2 (two) times daily as needed for cough. (Patient not taking: Reported on 09/09/2014) 89 mL 0  . HYDROcodone-acetaminophen (NORCO) 10-325 MG per tablet Take 1-2 tablets by mouth every 4 (four) hours as needed for moderate pain or severe pain. 360 tablet 0  . letrozole (FEMARA) 2.5 MG tablet Take 1 tablet (2.5 mg total) by mouth daily. (Patient not taking: Reported on 09/09/2014) 90 tablet 4  . lisinopril (PRINIVIL,ZESTRIL) 20 MG tablet   3  . menthol-cetylpyridinium (CEPACOL) 3 MG lozenge Take 1 lozenge (3 mg total) by mouth as needed for sore throat. (Patient not taking: Reported on 09/09/2014) 100 tablet 12  . mometasone (ELOCON) 0.1 % lotion Apply topically daily as needed. Use as directed 30 mL 5  . ondansetron (ZOFRAN-ODT) 8 MG disintegrating tablet DISSOLVE 1 TABLET ON THE TONGUE EVERY 8 HOURS AS NEEDED FOR NAUSEA 20 tablet 0  . palbociclib (IBRANCE) 125 MG capsule Take 1 capsule (125 mg total) by mouth daily. Take 21days out of 28 with Letrozole, with food (Patient not taking: Reported on 09/09/2014) 21 capsule 3  . pantoprazole (PROTONIX) 40 MG tablet Take 1 tablet (40 mg total) by mouth daily. 30 tablet 0  . Polyethyl Glycol-Propyl Glycol (SYSTANE OP) Apply 1 drop to eye daily as needed (dry eyes).    . polyethylene glycol (MIRALAX / GLYCOLAX) packet Take 17 g by mouth daily as needed for mild constipation. 14 each 0  . warfarin (COUMADIN) 4 MG tablet Take 0.5-1 tablets (2-4 mg total) by mouth daily. 19m on Mon.,Wed.,Fri., and 4457mall other days (Patient taking differently: Take 2-4 mg by mouth daily. 57m32mn Sun, Fri., and 2mg32ml other days) 90 tablet 4   No current facility-administered medications for this visit.    OBJECTIVE: Middle-aged white [name] who appears well Filed Vitals:   09/27/14 1134  BP: 141/70  Pulse: 70  Temp: 98.1 F (36.7 C)  Resp: 18     Body mass index is 25.91  kg/(m^2).    ECOG FS:0 - Asymptomatic Filed Vitals:   09/27/14 1134  BP: 141/70  Pulse: 70  Temp: 98.1 F (36.7 C)  Resp: 18   Sclerae unicteric, EOMs intact Oropharynx clearand moist No cervical or supraclavicular adenopathy Lungs no rales or rhonchi Heart regular rate and rhythm Abd soft, nontender, positive bowel sounds MSK no focal spinal tenderness, no upper extremity lymphedema Neuro: nonfocal, well oriented, appropriate affect Breasts: deferred   LAB RESULTS: Outside labs reviewed.No components found for: [id number]    Component Value Date/Time  WBC 2.4* 09/27/2014 1108   WBC 6.0 08/11/2014 0435   RBC 3.29* 09/27/2014 1108   RBC 2.99* 08/11/2014 0435   HGB 10.8* 09/27/2014 1108   HGB 10.1* 08/11/2014 0435   HCT 32.6* 09/27/2014 1108   HCT 30.8* 08/11/2014 0435   PLT 105* 09/27/2014 1108   PLT 339 08/11/2014 0435   MCV 99.1 09/27/2014 1108   MCV 103.0* 08/11/2014 0435   MCH 32.8 09/27/2014 1108   MCH 33.8 08/11/2014 0435   MCHC 33.1 09/27/2014 1108   MCHC 32.8 08/11/2014 0435   RDW 15.6* 09/27/2014 1108   RDW 13.6 08/11/2014 0435   LYMPHSABS 0.4* 09/27/2014 1108   LYMPHSABS 1.0 06/27/2010 1558   MONOABS 0.2 09/27/2014 1108   MONOABS 1.0 06/27/2010 1558   EOSABS 0.0 09/27/2014 1108   EOSABS 0.2 06/27/2010 1558   BASOSABS 0.1 09/27/2014 1108   BASOSABS 0.0 06/27/2010 1558     No results for input(s): INR in the last 168 hours.  Urinalysis    Component Value Date/Time   COLORURINE YELLOW 06/28/2010 0856   APPEARANCEUR CLEAR 06/28/2010 0856   LABSPEC 1.024 06/28/2010 0856   PHURINE 6.0 06/28/2010 0856   HGBUR NEGATIVE 06/28/2010 0856   BILIRUBINUR NEGATIVE 06/28/2010 0856   KETONESUR NEGATIVE 06/28/2010 0856   PROTEINUR NEGATIVE 06/28/2010 0856   UROBILINOGEN 0.2 06/28/2010 0856   NITRITE NEGATIVE 06/28/2010 0856   LEUKOCYTESUR  06/28/2010 0856    NEGATIVE MICROSCOPIC NOT DONE ON URINES WITH NEGATIVE PROTEIN, BLOOD, LEUKOCYTES, NITRITE, OR  GLUCOSE <1000 mg/dL.    STUDIES: Nm Pet Image Restag (ps) Skull Base To Thigh  09/07/2014   CLINICAL DATA:  Subsequent treatment strategy for breast cancer metastasized to bone.  EXAM: NUCLEAR MEDICINE PET SKULL BASE TO THIGH  TECHNIQUE: 6.7 mCi F-18 FDG was injected intravenously. Full-ring PET imaging was performed from the skull base to thigh after the radiotracer. CT data was obtained and used for attenuation correction and anatomic localization.  FASTING BLOOD GLUCOSE:  Value: 96 mg/dl  COMPARISON:  PET-CT 03/14/2014 and 12/10/2013. Chest CT 09/02/2014 and 08/05/2014.  FINDINGS: NECK  No hypermetabolic cervical lymph nodes are identified.There are no lesions of the pharyngeal mucosal space. Possible previous right thyroid resection.  CHEST  There are no hypermetabolic mediastinal, hilar or axillary lymph nodes. Stable postsurgical changes status post bilateral mastectomy and axillary node dissection. There is no abnormal chest wall activity. Extensive biapical subpleural scarring is again noted secondary to prior radiation therapy. At the site of the cavitary left upper lobe process demonstrated on CT 1 month ago, there is further contraction, now measuring 1.9 x 2.8 cm on image 17. This has associated air bronchograms and hypermetabolic activity with an SUV max of 6.0. No other abnormal pulmonary metabolic activity.  ABDOMEN/PELVIS  There is no hypermetabolic activity within the liver, adrenal glands, spleen or pancreas. There is no hypermetabolic nodal activity. There are stable cystic lesions within the liver and stable extrahepatic biliary dilatation status post cholecystectomy. There is a stable 11 mm left adrenal nodule without abnormal metabolic activity.  SKELETON  Largely treated osseous metastatic disease appears stable with multiple lytic and blastic lesions in the spine and pelvis. Within the proximal left femoral diaphysis, there is mildly increased metabolic activity compared with the prior  study (SUV max 3.6; previously 2.1). No focal lytic lesion or impending fracture identified in this area.  IMPRESSION: 1. Low-level hypermetabolic activity within the improving left upper lobe process, no longer cavitary. This most likely represents resolving infection, although  could be related to evolving post radiation necrosis. 2. No definite extra osseous metastatic disease. 3. Largely treated osseous metastatic disease. There is mildly increased activity within the proximal left femoral diaphysis without evidence of impending pathologic fracture.   Electronically Signed   By: Richardean Sale M.D.   On: 09/07/2014 13:16   ASSESSMENT: 69 y.o. [name], [name] status post right lumpectomy and axillary lymph node dissection December 2006 for a [place] N2, stage IIIA invasive ductal carcinoma, estrogen and progesterone receptor positive, with subsequent stage IV disease  (1) received adjuvant doxorubicin, docetaxel and cyclophosphamide  (2) status post adjuvant radiation  (3) on tamoxifen to September 2011 when she was noted to have metastatic disease to bone  (4) fulvestrant 02/06/2009 to 01/15/2011  (5) left axillary lymph node positive for an invasive ductal carcinoma, estrogen receptor and HER-2 negative November 2011, and a right breast recurrence for an estrogen receptor positive lesion at the same time.  (6) status post bilateral mastectomies 06/28/2010  (7) status post left chest and axillary radiation  (8) status post right chest wall recurrence October of 2012, treated with electron beam radiation completed December 2012  (9) progression in bone August 2013,  (10) status post radiation to the lumbosacral spine completed October 2013   (11) capecitabine November 2013 through January 2014  (12) tamoxifen/ everolimus February 2014, discontinued November 2014 with progression  (13) Megace 80 mg twice daily with progression January 2015  (14) Radiation to lumbar spine February  2015  (15) letrozole plus Ibrance started 07/08/2013  (16). Declines genetic testing (as of 08/17/2013)  (17) zolendronate 05/01/2009, discontinued because of pain; Xgeva started 10/08/2013, discontinued because of cost issues as of November 2015, switched to alendronate March 2016, switched back to [place] in May 2016, given every 2 or 3 months depending on the patient's visit frequency  OTHER ACTIVE PROBLEMS:  (1) chronic GI complaints, followed by Dr. Sydell Axon  (2) multiple orthopedic problems, including bilateral chronic foot pain, status post rotator cuff repair  (3) history of extensive superficial phlebitis, on chronic low dose Coumadin.  (4) LUL cavitary pneumonia diagnosed as MRSA while at [facility] April 2016, treated with Zosyn x 21 days  PLAN: Paulena is finally ready to go back to work. Her pulmonary symptoms have completely resolved. We are going to obtain a chest x-ray today to serve as a Carolyn baseline.I wrote her a "back to work" note.  She has some discomfort along the left upper hip and that area is a little bit "hot" on PET scanning. We are obtaining plain film of that area as well, and if there is a Carolyn lesion there we could consider radiation.in general her pain is well-controlled with no changes in her pain medication.  Otherwise we are continuing the letrozole and Palbociclib/Ibrance. The PET scan obtained in May shows her bone lesions, controlled except for possibly a Carolyn one in the left hip area, and no visceral lesions whatsoever.  We are continuing the denial some lab every visit here. It is very difficult for her to drive here every month so we are doing it every 2-3 months depending on how often she needs to be seen. She tolerates denial summa without any side effects that she is aware of.  She will see me again in August. She knows to call for any problems that may develop before that visit.  Chauncey Cruel, MD   09/27/2014 12:01 PM

## 2014-09-28 ENCOUNTER — Telehealth: Payer: Self-pay | Admitting: Oncology

## 2014-09-28 NOTE — Telephone Encounter (Signed)
Left message to confirm appointment for September. Mailed calendar.

## 2014-09-29 ENCOUNTER — Other Ambulatory Visit: Payer: Self-pay | Admitting: Oncology

## 2014-09-29 ENCOUNTER — Telehealth: Payer: Self-pay | Admitting: Oncology

## 2014-09-29 DIAGNOSIS — C50919 Malignant neoplasm of unspecified site of unspecified female breast: Secondary | ICD-10-CM

## 2014-09-29 NOTE — Telephone Encounter (Signed)
Lft msg for pt confirming labs/ov r/s from 09/06 to 08/17 per 06/02 POF, didn't cancel injection no notes to cancel, also sent msg to KD to schedule pt for Dr. Sondra Come per referral... Mailed schedule to pt.... KJ

## 2014-10-06 ENCOUNTER — Telehealth: Payer: Self-pay | Admitting: Oncology

## 2014-10-06 NOTE — Telephone Encounter (Signed)
Returned patient call as she requested to move up her injection to her visit on 8/17,this is ok per terri and the pharmacy,pt aware

## 2014-10-10 ENCOUNTER — Other Ambulatory Visit: Payer: Self-pay | Admitting: *Deleted

## 2014-10-10 DIAGNOSIS — C50919 Malignant neoplasm of unspecified site of unspecified female breast: Secondary | ICD-10-CM

## 2014-10-10 DIAGNOSIS — C7951 Secondary malignant neoplasm of bone: Principal | ICD-10-CM

## 2014-10-10 MED ORDER — MOMETASONE FUROATE 0.1 % EX SOLN: Freq: Every day | CUTANEOUS | Status: DC | PRN

## 2014-10-12 ENCOUNTER — Other Ambulatory Visit: Payer: Self-pay | Admitting: *Deleted

## 2014-10-12 DIAGNOSIS — C50919 Malignant neoplasm of unspecified site of unspecified female breast: Secondary | ICD-10-CM

## 2014-10-12 DIAGNOSIS — C7951 Secondary malignant neoplasm of bone: Principal | ICD-10-CM

## 2014-10-12 MED ORDER — MOMETASONE FUROATE 0.1 % EX SOLN: Freq: Every day | CUTANEOUS | Status: DC | PRN

## 2014-11-04 ENCOUNTER — Other Ambulatory Visit: Payer: Self-pay | Admitting: Oncology

## 2014-11-04 NOTE — Progress Notes (Unsigned)
I was called by a physician from the emergency room at [facility] where [facility] has presented with back pain and some difficulty walking. They obtain a repeat MRI of the spine which according to their reading shows improved bony metastases but some spondylosis.  I discussed this with the emergency room physician. There is some disjunction between the statement on the MRI and what the patient is experiencing. I would feel more comfortable if she saw a neurosurgeon today who could review the films and do a full neurologic exam.. That accordingly will be the plan.

## 2014-11-21 ENCOUNTER — Encounter: Payer: Self-pay | Admitting: Oncology

## 2014-11-21 NOTE — Progress Notes (Signed)
Per diplomat spec phar ibrance will be shipped 11/23/14 to the patient.

## 2014-11-23 ENCOUNTER — Telehealth: Payer: Self-pay | Admitting: *Deleted

## 2014-11-23 NOTE — Telephone Encounter (Signed)
Patient called wanting to cancel Xgeva injection. Stated that she had a reaction and had to go to the ED. Stated that the physician in the ED felt it was due to the [place]. Advised patient that injection was scheduled same day as her MD visit and that she could discuss this with him at that time. Patient verbalized understanding.

## 2014-11-29 ENCOUNTER — Encounter: Payer: Self-pay | Admitting: Internal Medicine

## 2014-12-14 ENCOUNTER — Ambulatory Visit: Payer: Medicare PPO

## 2014-12-14 ENCOUNTER — Telehealth: Payer: Self-pay | Admitting: Oncology

## 2014-12-14 ENCOUNTER — Other Ambulatory Visit: Payer: Self-pay | Admitting: Oncology

## 2014-12-14 ENCOUNTER — Encounter: Payer: Self-pay | Admitting: Oncology

## 2014-12-14 ENCOUNTER — Ambulatory Visit (HOSPITAL_BASED_OUTPATIENT_CLINIC_OR_DEPARTMENT_OTHER): Payer: Medicare PPO | Admitting: Oncology

## 2014-12-14 ENCOUNTER — Other Ambulatory Visit (HOSPITAL_BASED_OUTPATIENT_CLINIC_OR_DEPARTMENT_OTHER): Payer: Medicare PPO

## 2014-12-14 ENCOUNTER — Ambulatory Visit (HOSPITAL_COMMUNITY)
Admission: RE | Admit: 2014-12-14 | Discharge: 2014-12-14 | Disposition: A | Discharge status: Home / Self Care | Payer: Medicare PPO | Source: Ambulatory Visit | Attending: Oncology | Admitting: Oncology

## 2014-12-14 VITALS — BP 140/79 | HR 62 | Temp 98.2°F | Resp 18 | Ht 62.0 in | Wt 141.0 lb

## 2014-12-14 DIAGNOSIS — C7951 Secondary malignant neoplasm of bone: Secondary | ICD-10-CM | POA: Diagnosis not present

## 2014-12-14 DIAGNOSIS — C50919 Malignant neoplasm of unspecified site of unspecified female breast: Secondary | ICD-10-CM

## 2014-12-14 DIAGNOSIS — C50511 Malignant neoplasm of lower-outer quadrant of right female breast: Secondary | ICD-10-CM

## 2014-12-14 DIAGNOSIS — R0602 Shortness of breath: Secondary | ICD-10-CM

## 2014-12-14 DIAGNOSIS — I809 Phlebitis and thrombophlebitis of unspecified site: Secondary | ICD-10-CM

## 2014-12-14 DIAGNOSIS — J189 Pneumonia, unspecified organism: Secondary | ICD-10-CM | POA: Diagnosis present

## 2014-12-14 DIAGNOSIS — M79605 Pain in left leg: Secondary | ICD-10-CM

## 2014-12-14 DIAGNOSIS — C773 Secondary and unspecified malignant neoplasm of axilla and upper limb lymph nodes: Secondary | ICD-10-CM | POA: Diagnosis not present

## 2014-12-14 DIAGNOSIS — R918 Other nonspecific abnormal finding of lung field: Secondary | ICD-10-CM | POA: Insufficient documentation

## 2014-12-14 LAB — COMPREHENSIVE METABOLIC PANEL (CC13)
ALBUMIN: 3.6 g/dL (ref 3.5–5.0)
ALK PHOS: 64 U/L (ref 40–150)
ALT: 10 U/L (ref 0–55)
AST: 16 U/L (ref 5–34)
Anion Gap: 7 mEq/L (ref 3–11)
BUN: 14 mg/dL (ref 7.0–26.0)
CO2: 28 meq/L (ref 22–29)
Calcium: 8.7 mg/dL (ref 8.4–10.4)
Chloride: 109 mEq/L (ref 98–109)
Creatinine: 0.8 mg/dL (ref 0.6–1.1)
EGFR: 79 mL/min/{1.73_m2} — AB (ref >90)
GLUCOSE: 78 mg/dL (ref 70–140)
Potassium: 4.2 mEq/L (ref 3.5–5.1)
SODIUM: 144 meq/L (ref 136–145)
Total Bilirubin: 0.26 mg/dL (ref 0.20–1.20)
Total Protein: 6.6 g/dL (ref 6.4–8.3)

## 2014-12-14 MED ORDER — HYDROCODONE-ACETAMINOPHEN 10-325 MG PO TABS: 1.0000 | ORAL_TABLET | ORAL | Status: DC | PRN

## 2014-12-14 NOTE — Progress Notes (Signed)
[name]  [phone]) [phone] [phone]) [phone]     ID: [name] OB: 1945/08/08  MR#: [id number]  ZDG#:[id number]  PCP: Carolyn Labrum, MD GYN:  [name]  SU:  OTHER MD: [name], [name]  CHIEF COMPLAINT: stage IV breast cancer  CURRENT TREATMENT: letrozole + palbocilcib [name]); denosumab  BREAST CANCER HISTORY: From [name] summary 07/14/2013::  "Complex 69 year old woman with history of ER-positive and ER negative metachronous primary bilateral breast cancers.  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 Progression in bone while on tamoxifen September 2011. Initial complete response to monthly Faslodex injections.She developed a malignant left axillary lymph node with no clear primary in the left breast November 2011. This tumor was ER negative. HER-2 negative. While under evaluation for this lesion she developed a new lesion in the right breast which was ER positive. She underwent bilateral mastectomies 06/28/2010. She was continued on hormonal therapy with monthly Faslodex injections in view of the ER positive component of her tumor with a complete response to this hormone in bone. She underwent radiation to the left chest and axilla subsequent to the mastectomies. She developed a cutaneous recurrence on the right chest wall along the medial aspect of the right mastectomy scar, biopsy proven, initially detected on clinical exam by her radiation oncologist on 02/14/2011. She was treated with electron beam radiation.. 3000 cGy in 15 fractions through December 2012. Further progression in bone in August 2013. Palliative radiation to lumbosacral spine September through October 2013. Brief trial of Xeloda November 2013 through January 2014.  Change to tamoxifen plus [employer] February 2014 stopped for progression and 25 pound weight loss November 2014. Trial of Megace 80 mg twice a day with improved appetite but progressive rise in tumor marker and progressive low back pain with further progression on MRI of the spine done January 2015. Radiation to the lumbar spine February 2015. She started most recent salvage regimen with a combination of Ibrance plus Femara on 07/08/2013. It is too early to assess response."  The patient's breast cancer history is summarized in detailed below   INTERVAL HISTORY: [name] returns today for followup of her stage IV breast cancer. After her last denosumab treatment she developed significant pain in her right leg. This took her to [facility] emergency room in the middle of the night. She tells me several tests were obtained but she is not sure what they were. She was giving some morphine. The next day however the pain had resolved.  She is now working 35 hours per week. She is tolerating her hydrocodone without any side effects she is aware of except that perhaps once every couple of weeks she gets a little bit nauseated. She is not constipated. In fact she has bowel movements 2 or 3 times a night some nights. She is not aware of any dietary changes that might explain that. Overall her pain is well-controlled but she is having a little bit more pain in her left upper leg which is where she is known to have a significant lesion. There was no suggestion of impending fracture on plain films in May.  She is tolerating the Ibrance and letrozole with no side effects that she is aware of. She obtains them at no cost.  REVIEW OF SYSTEMS: [name] tells me her CA-[id number] is now "below normal". She does  have some hot flashes and some night sweats. She sleeps poorly. She describes herself is fatigued. She has some blurred vision problems, ringing in her ears, runny nose, sinus symptoms, difficulty swallowing, mouth  sores, irregular heartbeat, palpitations, ankle swelling, shortness of breath when climbing stairs, poor appetite, occasional abdominal cramps, some stress urinary incontinence, had a skin rash after denosumab which was tough to treat and finally cleared with prednisone, bruises easily, has difficulty walking, feels forgetful and anxious but not depressed. A detailed review of systems today was otherwise stable  PAST MEDICAL HISTORY: Past Medical History  Diagnosis Date  . Cancer     RBreast, Lower back , Under l Arm  . Hypertension   . GERD (gastroesophageal reflux disease)   . Breast cancer metastasized to bone 04/30/2011  . Radiation 01/15/2012    Lumbosacral spine 3500 cGy 14  fx  . Swollen R ankle 08/10/2012  . Fracture of ankle, medial malleolus, right, closed 08/10/2012  . Tubular adenoma 2011  . Hiatal hernia 2011  . Schatzki's ring   . Anorexia 03/02/2013  . Weight loss due to medication 03/02/2013    PAST SURGICAL HISTORY: Past Surgical History  Procedure Laterality Date  . Gallbladder surgery  1991  . Mastectomy  06/28/10    bilateral- [name], [name]  . Inguinal hernia repair  90's    right  . Parathyroidectomy  90's  . Carpal tunnel release  90's  . Oophorectomy  90's    right  . Lymph node dissection    . Breast surgery    . Cholecystectomy    . Colonoscopy  01/08/10    [name]- anal tag,hemorrhoid o/w normal rectum, pancolonic diverticula, diminutive polyp in the base of the cecum= tubular adenoma on bx. poor prep  . Esophagogastroduodenoscopy  01/08/10    [name]- normal esophagus, small hiatal hernia, antum and body erosions, pedunculated polyp between D1 and D2, duodenal diverticulum, lymphangiectasia, second portion of the duodenum.stomach bx= inflammation, duodenum bx= adenoma  . Rotator cuff repair  08/27/11    Dr. Alphonzo [name]- [facility]- [name]  . Esophagogastroduodenoscopy (egd) with propofol N/A 11/19/2012    RMR: non-critical Schatzki's ring s/p  dilation with 109 F, multiple gastric polyps, duodenal polyp s/p piecemeal snare polypectomy, gastric polypectomy, path benign  . Maloney dilation N/A 11/19/2012    Procedure: Venia Minks DILATION;  Surgeon: Daneil Dolin, MD;  Location: [facility];  Service: Endoscopy;  Laterality: N/A;  #[id number]  . Polypectomy N/A 11/19/2012    Procedure: POLYPECTOMY;  Surgeon: Daneil Dolin, MD;  Location: [facility];  Service: Endoscopy;  Laterality: N/A;  duodenal  . Esophageal biopsy N/A 11/19/2012    Procedure: BIOPSY;  Surgeon: Daneil Dolin, MD;  Location: [facility];  Service: Endoscopy;  Laterality: N/A;  esophageal    FAMILY HISTORY Family History  Problem Relation Age of Onset  . Cancer Mother     Colon  . Cancer Father     Lung, Mouth,leg   the patient's father died at the age of 65 from metastatic head and neck carcinoma. The patient's mother died of breast cancer the age of 81. The patient had 2 brothers, both with prostate cancer. She has 4 sisters, one diagnosed with breast cancer at the age of 73, another 1 recent underwent bilateral mastectomies. Another one died from colon cancer.   GYNECOLOGIC HISTORY:   menarche age 51, she is GX P0. She underwent menopause at age 23, status post HRT for 12 years. She status  post right salpingo-oophorectomy.   SOCIAL HISTORY:  She works at [employer] in [place]. Her husband died at [facility] [place] from complications of surgery after a 7-1/2 month hospitalization) 2008). She lives by herself, with no pets. She tells me she works 9 hours a day 5 days a week.     ADVANCED DIRECTIVES: In place   HEALTH MAINTENANCE: Social History  Substance Use Topics  . Smoking status: Former Smoker    Quit date: 11/22/1982  . Smokeless tobacco: Never Used     Comment: FQuit smoking x 32 years  . Alcohol Use: No     Colonoscopy:  PAP:  Bone density:  Lipid panel:  Allergies  Allergen Reactions  . Aspirin   . Percodan [Oxycodone-Aspirin] Nausea Only  .  Codeine Nausea Only    Current Outpatient Prescriptions  Medication Sig Dispense Refill  . HYDROcodone-acetaminophen (NORCO) 10-325 MG per tablet Take 1-2 tablets by mouth every 4 (four) hours as needed for moderate pain or severe pain. 360 tablet 0  . letrozole (FEMARA) 2.5 MG tablet Take 1 tablet (2.5 mg total) by mouth daily. (Patient not taking: Reported on 09/09/2014) 90 tablet 4  . lisinopril (PRINIVIL,ZESTRIL) 20 MG tablet   3  . mometasone (ELOCON) 0.1 % lotion Apply topically daily as needed. Use as directed 30 mL 5  . ondansetron (ZOFRAN-ODT) 8 MG disintegrating tablet DISSOLVE 1 TABLET ON THE TONGUE EVERY 8 HOURS AS NEEDED FOR NAUSEA 20 tablet 0  . palbociclib (IBRANCE) 125 MG capsule Take 1 capsule (125 mg total) by mouth daily. Take 21days out of 28 with Letrozole, with food (Patient not taking: Reported on 09/09/2014) 21 capsule 3  . pantoprazole (PROTONIX) 40 MG tablet Take 1 tablet (40 mg total) by mouth daily. 30 tablet 0  . Polyethyl Glycol-Propyl Glycol (SYSTANE OP) Apply 1 drop to eye daily as needed (dry eyes).    . warfarin (COUMADIN) 4 MG tablet 36m on Mon.and,Fri., and 2 mg all other days 90 tablet 4   No current facility-administered medications for this visit.    OBJECTIVE: Middle-aged white woman in no acute distress Filed Vitals:   12/14/14 1012  BP: 140/79  Pulse: 62  Temp: 98.2 F (36.8 C)  Resp: 18     Body mass index is 25.78 kg/(m^2).    ECOG FS:1 - Symptomatic but completely ambulatory Filed Vitals:   12/14/14 1012  BP: 140/79  Pulse: 62  Temp: 98.2 F (36.8 C)  Resp: 18   Sclerae unicteric, pupils round and equal Oropharynx clear and moist-- no thrush or other lesions No cervical or supraclavicular adenopathy Lungs no rales or rhonchi Heart regular rate and rhythm Abd soft, nontender, positive bowel sounds MSK no focal spinal tenderness, no upper extremity lymphedema; normal straight leg raise bilaterally Neuro: nonfocal, well oriented,  appropriate affect Breasts: Status post bilateral mastectomies. No evidence of chest wall recurrence   LAB RESULTS: Outside labs reviewed.No components found for: LABCA125 CBC    Component Value Date/Time   WBC 2.4* 09/27/2014 1108   WBC 6.0 08/11/2014 0435   RBC 3.29* 09/27/2014 1108   RBC 2.99* 08/11/2014 0435   HGB 10.8* 09/27/2014 1108   HGB 10.1* 08/11/2014 0435   HCT 32.6* 09/27/2014 1108   HCT 30.8* 08/11/2014 0435   PLT 105* 09/27/2014 1108   PLT 339 08/11/2014 0435   MCV 99.1 09/27/2014 1108   MCV 103.0* 08/11/2014 0435   MCH 32.8 09/27/2014 1108   MCH 33.8 08/11/2014 0435  MCHC 33.1 09/27/2014 1108   MCHC 32.8 08/11/2014 0435   RDW 15.6* 09/27/2014 1108   RDW 13.6 08/11/2014 0435   LYMPHSABS 0.4* 09/27/2014 1108   LYMPHSABS 1.0 06/27/2010 1558   MONOABS 0.2 09/27/2014 1108   MONOABS 1.0 06/27/2010 1558   EOSABS 0.0 09/27/2014 1108   EOSABS 0.2 06/27/2010 1558   BASOSABS 0.1 09/27/2014 1108   BASOSABS 0.0 06/27/2010 1558     No results for input(s): INR in the last 168 hours.  Urinalysis    Component Value Date/Time   COLORURINE YELLOW 06/28/2010 0856   APPEARANCEUR CLEAR 06/28/2010 0856   LABSPEC 1.024 06/28/2010 0856   PHURINE 6.0 06/28/2010 0856   HGBUR NEGATIVE 06/28/2010 0856   BILIRUBINUR NEGATIVE 06/28/2010 0856   KETONESUR NEGATIVE 06/28/2010 0856   PROTEINUR NEGATIVE 06/28/2010 0856   UROBILINOGEN 0.2 06/28/2010 0856   NITRITE NEGATIVE 06/28/2010 0856   LEUKOCYTESUR  06/28/2010 0856    NEGATIVE MICROSCOPIC NOT DONE ON URINES WITH NEGATIVE PROTEIN, BLOOD, LEUKOCYTES, NITRITE, OR GLUCOSE <1000 mg/dL.    STUDIES: CLINICAL DATA: Subsequent treatment strategy for breast cancer metastasized to bone.  EXAM: NUCLEAR MEDICINE PET SKULL BASE TO THIGH  TECHNIQUE: 6.7 mCi F-18 FDG was injected intravenously. Full-ring PET imaging was performed from the skull base to thigh after the radiotracer. CT data was obtained and used for attenuation  correction and anatomic localization.  FASTING BLOOD GLUCOSE: Value: 96 mg/dl  COMPARISON: PET-CT 03/14/2014 and 12/10/2013. Chest CT 09/02/2014 and 08/05/2014.  FINDINGS: NECK  No hypermetabolic cervical lymph nodes are identified.There are no lesions of the pharyngeal mucosal space. Possible previous right thyroid resection.  CHEST  There are no hypermetabolic mediastinal, hilar or axillary lymph nodes. Stable postsurgical changes status post bilateral mastectomy and axillary node dissection. There is no abnormal chest wall activity. Extensive biapical subpleural scarring is again noted secondary to prior radiation therapy. At the site of the cavitary left upper lobe process demonstrated on CT 1 month ago, there is further contraction, now measuring 1.9 x 2.8 cm on image 17. This has associated air bronchograms and hypermetabolic activity with an SUV max of 6.0. No other abnormal pulmonary metabolic activity.  ABDOMEN/PELVIS  There is no hypermetabolic activity within the liver, adrenal glands, spleen or pancreas. There is no hypermetabolic nodal activity. There are stable cystic lesions within the liver and stable extrahepatic biliary dilatation status post cholecystectomy. There is a stable 11 mm left adrenal nodule without abnormal metabolic activity.  SKELETON  Largely treated osseous metastatic disease appears stable with multiple lytic and blastic lesions in the spine and pelvis. Within the proximal left femoral diaphysis, there is mildly increased metabolic activity compared with the prior study (SUV max 3.6; previously 2.1). No focal lytic lesion or impending fracture identified in this area.  IMPRESSION: 1. Low-level hypermetabolic activity within the improving left upper lobe process, no longer cavitary. This most likely represents resolving infection, although could be related to evolving post radiation necrosis. 2. No definite extra osseous  metastatic disease. 3. Largely treated osseous metastatic disease. There is mildly increased activity within the proximal left femoral diaphysis without evidence of impending pathologic fracture.   Electronically Signed  By: Richardean Sale M.D.  On: 09/07/2014 13:1  EXAM: LEFT HIP (WITH PELVIS) 2-3 VIEWS  COMPARISON: PET scan 09/07/2014.  FINDINGS: An ill-defined lucent lesion is present within the proximal left femur corresponding with the FDG activity. The cortex is intact. No other focal lytic lesions are present. Mild degenerative changes are noted in the  lower lumbar spine.  IMPRESSION: 1. Lytic lesion within the proximal left femur corresponds with the area of FDG uptake. This represents a focal metastasis. The cortex is intact.   Electronically Signed  By: San Morelle M.D.  On: 09/27/2014 15:57   ASSESSMENT: 69 y.o. [place], [place] woman status post right lumpectomy and axillary lymph node dissection December 2006 for a [place] N2, stage IIIA invasive ductal carcinoma, estrogen and progesterone receptor positive, with subsequent stage IV disease  (1) received adjuvant doxorubicin, docetaxel and cyclophosphamide  (2) status post adjuvant radiation  (3) on tamoxifen to September 2011 when she was noted to have metastatic disease to bone  (4) fulvestrant 02/06/2009 to 01/15/2011  (5) left axillary lymph node positive for an invasive ductal carcinoma, estrogen receptor and HER-2 negative November 2011, and a right breast recurrence for an estrogen receptor positive lesion at the same time.  (6) status post bilateral mastectomies 06/28/2010  (7) status post left chest and axillary radiation  (8) status post right chest wall recurrence October of 2012, treated with electron beam radiation completed December 2012  (9) progression in bone August 2013,  (10) status post radiation to the lumbosacral spine completed October 2013   (11) capecitabine  November 2013 through January 2014  (12) tamoxifen/ everolimus February 2014, discontinued November 2014 with progression  (13) Megace 80 mg twice daily with progression January 2015  (14) Radiation to lumbar spine February 2015  (15) letrozole plus Ibrance started 07/08/2013  (16). Declines genetic testing (as of 08/17/2013)  (17) zolendronate 05/01/2009, discontinued because of pain; Xgeva started 10/08/2013, discontinued because of cost issues as of November 2015, switched to alendronate March 2016, switched back to [place] in May 2016, given every 2 or 3 months depending on the patient's visit frequency  OTHER ACTIVE PROBLEMS:  (1) chronic GI complaints, followed by Dr. Sydell Axon  (2) multiple orthopedic problems, including bilateral chronic foot pain, status post rotator cuff repair  (3) history of extensive superficial phlebitis, on chronic low dose Coumadin.  (4) LUL cavitary pneumonia diagnosed as MRSA while at [facility] April 2016, treated with Zosyn x 21 days  PLAN: Loida is tolerating her letrozole and Ibrance well. The plan is to continue until there is evidence of disease progression. She has a good understanding that the [name] is taking and 21 days and then she is off 7 days.  Today we reviewed what neutrophils are and she understands that to start the Ibrance the neutrophils must be at least 1.5. Generally her [name] labs are checked at the start of each cycle so that pretty much all she needs to be aware of at this point.  She is having a little bit more pain in her left upper leg area did she would like to see Dr. Sondra Come and consider further radiation to that area and I am arranging for that.  She has no active symptoms regarding her left upper lobe pneumonia. I am obtaining a chest x-ray on the way out today just to document were we are in that area.  Given the pain she had in her right leg and the rash she developed after her last denosumab treatment, I am  not eager to repeat that. We will discuss that further at the next visit.  Otherwise I again refilled her pain medication and arrange for her to see me again in 2 months. At this point I am very pleased with the way Marcile is doing with her metastatic breast cancer. She knows to call  for any problems that may develop before her next visit here. Chauncey Cruel, MD   12/14/2014 10:30 AM

## 2014-12-14 NOTE — Telephone Encounter (Signed)
Appointments made and avs printed for patient °

## 2014-12-14 NOTE — Addendum Note (Signed)
Addended by: Prentiss Bells on: 12/14/2014 11:39 AM   Modules accepted: Medications

## 2014-12-16 ENCOUNTER — Other Ambulatory Visit: Payer: Self-pay | Admitting: Oncology

## 2014-12-23 ENCOUNTER — Encounter: Payer: Self-pay | Admitting: Oncology

## 2014-12-23 ENCOUNTER — Telehealth: Payer: Self-pay | Admitting: *Deleted

## 2014-12-23 ENCOUNTER — Other Ambulatory Visit: Payer: Self-pay | Admitting: *Deleted

## 2014-12-23 MED ORDER — PALBOCICLIB 125 MG PO CAPS: 125.0000 mg | ORAL_CAPSULE | Freq: Every day | ORAL | Status: DC

## 2014-12-23 NOTE — Telephone Encounter (Signed)
Script obtained with MD signiture and faxed to [place]

## 2014-12-23 NOTE — Progress Notes (Signed)
Per [facility] ibrance has been processed and they will be calling the patient to set up delivery

## 2014-12-23 NOTE — Telephone Encounter (Signed)
PT. TO RESTART ON 12/27/14. PRESCRIPTION NEEDS TO BE CALLED OR FAXED TO [facility] TODAY.

## 2014-12-26 ENCOUNTER — Encounter: Payer: Self-pay | Admitting: Oncology

## 2014-12-26 NOTE — Progress Notes (Signed)
Per [facility] patient has asst with PAN 5000.00 and no copay for ibrance. I will send to medical records.

## 2014-12-27 ENCOUNTER — Encounter: Payer: Self-pay | Admitting: Oncology

## 2014-12-27 NOTE — Progress Notes (Signed)
[facility] said ibrance/letrozole was processed and being shipped to patient 12/28/14

## 2014-12-29 ENCOUNTER — Ambulatory Visit
Admission: RE | Admit: 2014-12-29 | Discharge: 2014-12-29 | Disposition: A | Discharge status: Home / Self Care | Payer: Medicare PPO | Source: Ambulatory Visit | Attending: Radiation Oncology | Admitting: Radiation Oncology

## 2014-12-29 ENCOUNTER — Telehealth: Payer: Self-pay | Admitting: *Deleted

## 2014-12-29 ENCOUNTER — Ambulatory Visit (HOSPITAL_COMMUNITY)
Admission: RE | Admit: 2014-12-29 | Discharge: 2014-12-29 | Disposition: A | Discharge status: Home / Self Care | Payer: Medicare PPO | Source: Ambulatory Visit | Attending: Radiation Oncology | Admitting: Radiation Oncology

## 2014-12-29 ENCOUNTER — Encounter: Payer: Self-pay | Admitting: Radiation Oncology

## 2014-12-29 VITALS — BP 140/86 | HR 62 | Temp 98.3°F | Resp 16 | Wt 139.4 lb

## 2014-12-29 DIAGNOSIS — Z7901 Long term (current) use of anticoagulants: Secondary | ICD-10-CM | POA: Insufficient documentation

## 2014-12-29 DIAGNOSIS — Z79899 Other long term (current) drug therapy: Secondary | ICD-10-CM | POA: Diagnosis not present

## 2014-12-29 DIAGNOSIS — C50912 Malignant neoplasm of unspecified site of left female breast: Secondary | ICD-10-CM | POA: Diagnosis not present

## 2014-12-29 DIAGNOSIS — M79605 Pain in left leg: Secondary | ICD-10-CM | POA: Diagnosis present

## 2014-12-29 DIAGNOSIS — R52 Pain, unspecified: Secondary | ICD-10-CM

## 2014-12-29 DIAGNOSIS — C7951 Secondary malignant neoplasm of bone: Principal | ICD-10-CM

## 2014-12-29 DIAGNOSIS — Z886 Allergy status to analgesic agent status: Secondary | ICD-10-CM | POA: Insufficient documentation

## 2014-12-29 DIAGNOSIS — Z853 Personal history of malignant neoplasm of breast: Secondary | ICD-10-CM | POA: Diagnosis not present

## 2014-12-29 DIAGNOSIS — M25552 Pain in left hip: Secondary | ICD-10-CM | POA: Insufficient documentation

## 2014-12-29 DIAGNOSIS — C50919 Malignant neoplasm of unspecified site of unspecified female breast: Secondary | ICD-10-CM

## 2014-12-29 NOTE — Telephone Encounter (Signed)
Left VMM that labs are fine and to continue Ibrance as she is taking it. Advised to call with any questions.

## 2014-12-29 NOTE — Progress Notes (Signed)
Please see the Nurse Progress Note in the MD Initial Consult Encounter for this patient. 

## 2014-12-29 NOTE — Progress Notes (Signed)
Radiation Oncology         [phone] ________________________________  Name: [name] MRN: [id number]  Date: 12/29/2014  DOB: Oct 14, 1945  Follow-Up Visit Note  CC: Curlene Labrum, MD  Magrinat, Virgie Dad, MD  Diagnosis:  Bilateral metachronous breast cancer    Narrative: The patient returns today for further evaluation and consideration for additional radiation therapy. Dr. [name] reported that the patient was experiencing pain in the left hip. The patient stated however, that she has been experiencing pain "in the leg, not the hip." Symptoms of severe pain started two weeks after her most recent radiation treatment. "It just got a deeper hurt to it. It never went away," is how the patient described the progression of her pain. It has spread to the left knee. She confirmed that she has not fallen or had an accident that lead to this pain. She confirms that the pain occurs "all of the time", not just upon standing. She confirms that she experiences numbness in the left leg and feet often. "The front of my legs get very numb all of the time. I have to use my gel on my legs three times a day, even at work sometimes. At night when I wake up, that's the first thing I think of is that my legs are numb as can be," the patient vocalized after confirming that she has cut down her 45 hour work week to 35 hours to attempt to alleviate her symptoms of pain. She does not report using medications to alleviate her symptoms of pain. Recent scans were reviewed with the patient. The patient projected a healthy mental status and was accompanied by her friend. She currently lives in [place] and completed an hour and half commute to attend today's radiation oncology appointment. All charts were checked and scans reviewed.                ALLERGIES:  is allergic to aspirin; percodan; and codeine.  Meds: Current Outpatient Prescriptions  Medication Sig Dispense Refill  . fluticasone (FLONASE) 50 MCG/ACT  nasal spray Place 2 sprays into both nostrils daily.  1  . HYDROcodone-acetaminophen (NORCO) 10-325 MG per tablet Take 1-2 tablets by mouth every 4 (four) hours as needed for moderate pain or severe pain. 360 tablet 0  . letrozole (FEMARA) 2.5 MG tablet Take 1 tablet (2.5 mg total) by mouth daily. 90 tablet 4  . lisinopril (PRINIVIL,ZESTRIL) 20 MG tablet   3  . mometasone (ELOCON) 0.1 % lotion APP AA D UTD  5  . ondansetron (ZOFRAN-ODT) 8 MG disintegrating tablet DISSOLVE 1 TABLET ON THE TONGUE EVERY 8 HOURS AS NEEDED FOR NAUSEA 20 tablet 0  . palbociclib (IBRANCE) 125 MG capsule Take 1 capsule (125 mg total) by mouth daily. Take 21days out of 28 with Letrozole, with food 21 capsule 3  . pantoprazole (PROTONIX) 40 MG tablet Take 1 tablet (40 mg total) by mouth daily. 30 tablet 0  . Polyethyl Glycol-Propyl Glycol (SYSTANE OP) Apply 1 drop to eye daily as needed (dry eyes).    . Vitamin D, Ergocalciferol, (DRISDOL) 50000 UNITS CAPS capsule Take 1 capsule (50,000 Units total) by mouth every 7 (seven) days. 30 capsule   . warfarin (COUMADIN) 4 MG tablet 4mg  on Mon.and,Fri., and 2 mg all other days 90 tablet 4   No current facility-administered medications for this encounter.    Physical Findings: The patient is alert and oriented x3 and is in no acute distress.  weight is 139 lb  6.4 oz (63.231 kg). Her oral temperature is 98.3 F (36.8 C). Her blood pressure is 140/86 and her pulse is 62. Her respiration is 16 and oxygen saturation is 97%.  No palpable supraclavicular or axillary adenopathy. The lungs are clear to auscultation. The heart has a regular rhythm and rate. Palpation and percussion along the spine area reveals no obvious point tenderness. Patient has some discomfort with palpation in the left mid thigh. Motor strength is 5 out of 5 in the proximal and distal muscle groups of the lower extremities. Palpation along the left tibia region reveals no suspicious areas or point tenderness area she  does complain of some generalized soreness along the left tibial region.   Lab Findings: Lab Results  Component Value Date   WBC 2.4* 09/27/2014   HGB 10.8* 09/27/2014   HCT 32.6* 09/27/2014   MCV 99.1 09/27/2014   PLT 105* 09/27/2014      Radiographic Findings: Left femur imaging pending from this afternoon.  Impression: Patient recently completed palliative radiation therapy directed at the lytic lesion in her left femur. This initially showed improvement in pain however has recurred in the patient says it is worse than prior to her initial course of treatment. Patient received a short course of therapy at that time, 20 gray in 5 fractions. Patient would likely be a candidate for additional radiation therapy to this area even her persistent pain.  Plan: We will await results of her plain x-rays this afternoon but likely we'll set her up for additional radiation therapy at the [facility] in [place].   This document serves as a record of services personally performed by Gery Pray, MD. It was created on his behalf by Lenn Cal, a trained medical scribe. The creation of this record is based on the scribe's personal observations and the provider's statements to them. This document has been checked and approved by the attending provider.   ____________________________________  Blair Promise, PhD, MD

## 2014-12-29 NOTE — Progress Notes (Signed)
Histology and Location of Primary Cancer: stage IV breast cancer   Sites of Visceral and Bony Metastatic Disease: multiple lytic and blastic lesions in the spine and pelvis,Lytic lesion within the proximal left femur    Location(s) of Symptomatic Metastases: left leg  Past/Anticipated chemotherapy by medical oncology, if any: adjuvant doxorubicin, docetaxel and cyclophosphamide, tamoxifen to September 2011, fulvestrant 02/06/2009 to 01/15/2011, capecitabine November 2013 through January 2014. Tamoxifen/ everolimus February 2014, discontinued November 2014 with progression. letrozole plus Ibrance started 07/08/2013. Megace 80 mg twice daily with progression January 2015. zolendronate 05/01/2009, discontinued because of pain; Xgeva started 10/08/2013, discontinued because of cost issues as of November 2015, switched to alendronate March 2016, switched back to [place] in May 2016, given every 2 or 3 months depending on the patient's visit frequency.  Pain on a scale of 0-10 is: 8 - left leg radiation down to her knee.  She reports the pain started to get worse 2 weeks after finishing radiation.  Ambulatory status? Walker? Wheelchair?: ambulatory  SAFETY ISSUES:  Prior radiation? Right chest wall - 3000 cGy in 15 fractions through December 2012, 01/15/2012 Lumbosacral spine 3500 cGy 14  Fx, Radiation to the lumbar spine February 2015  Pacemaker/ICD? no  Possible current pregnancy? no  Is the patient on methotrexate? no  Current Complaints / other details:  Patient is here with her friend today.  She reports having a poor appetite.   BP 140/86 mmHg  Pulse 62  Temp(Src) 98.3 F (36.8 C) (Oral)  Resp 16  Wt 139 lb 6.4 oz (63.231 kg)  SpO2 97%   Wt Readings from Last 3 Encounters:  12/29/14 139 lb 6.4 oz (63.231 kg)  12/14/14 141 lb (63.957 kg)  09/27/14 141 lb 11.2 oz (64.275 kg)

## 2015-01-03 ENCOUNTER — Other Ambulatory Visit: Payer: Medicare PPO

## 2015-01-03 ENCOUNTER — Ambulatory Visit: Payer: Medicare PPO | Admitting: Oncology

## 2015-01-03 ENCOUNTER — Ambulatory Visit: Payer: Medicare PPO

## 2015-01-04 ENCOUNTER — Encounter: Payer: Self-pay | Admitting: Skilled Nursing Facility1

## 2015-01-04 NOTE — Progress Notes (Signed)
Subjective:     Patient ID: [name], female   DOB: 02-Aug-1945, 69 y.o.   MRN: [id number]  HPI   Review of Systems     Objective:   Physical Exam To assist the pt in identifying some dietary strategies to gain some lost wt back.     Assessment:     Pt identified as being malnourished due to wt loss. Pt contacted via the telephone at [phone].    Plan:     Dietitian left a voicemail prompting the pt to contact Ernestene Kiel at [phone].

## 2015-01-10 ENCOUNTER — Ambulatory Visit: Payer: Medicare PPO | Admitting: Internal Medicine

## 2015-01-12 ENCOUNTER — Telehealth: Payer: Self-pay | Admitting: Radiation Oncology

## 2015-01-12 NOTE — Telephone Encounter (Signed)
@  [id number]  Radiation Oncology         [phone]) [phone] ________________________________  Name: [name] MRN: [id number]  Date: 01/12/2015  DOB: 12/31/1945  Telephone contact and treatment discussion  CC: Curlene Labrum, MD  No ref. provider found  Diagnosis:   Metastatic breast cancer  I  Narrative:  The patient has been having pain in both upper legs with standing and walking and in when standing up from sitting position. Patient underwent a plain x-ray of her left femur where she had recently completed radiation therapy. This showed very subtle changes of her lytic lesion, official diagnostic radiology report showed no lytic disease. Patient was setup for planning and treatment in [facility] for this area and the patient was complaining of pain along the right upper leg at that time. Diagnostic radiology reviewed the patient's plain films and suggested a bone scan. This was performed at [facility] earlier this week. This showed no active osseous disease.  Earlier this evening I spoke with [name] discussing these findings. At this time I would not recommend radiation therapy to the left or right femur given lack of x-ray changes on plain film and bone scan. Patient understands this issue. She will call if her pain worsens and possibly be scheduled for a MRI or PET scan. She is scheduled for follow-up with medical oncology next month.                             ____________________________________ Gery Pray, MD

## 2015-01-17 NOTE — Progress Notes (Signed)
Whole body scan results received from [facility] imaging.  Dr. [name] to review and it will go to be scanned in patient's records.

## 2015-01-23 ENCOUNTER — Telehealth: Payer: Self-pay | Admitting: *Deleted

## 2015-01-23 ENCOUNTER — Emergency Department (HOSPITAL_COMMUNITY)
Admission: EM | Admit: 2015-01-23 | Discharge: 2015-01-23 | Disposition: A | Discharge status: Home / Self Care | Payer: Medicare PPO | Source: Home / Self Care | Attending: Emergency Medicine | Admitting: Emergency Medicine

## 2015-01-23 ENCOUNTER — Encounter (HOSPITAL_COMMUNITY): Payer: Self-pay | Admitting: Emergency Medicine

## 2015-01-23 DIAGNOSIS — R197 Diarrhea, unspecified: Secondary | ICD-10-CM

## 2015-01-23 DIAGNOSIS — E876 Hypokalemia: Secondary | ICD-10-CM

## 2015-01-23 LAB — CBC
HCT: 33.4 % — ABNORMAL LOW (ref 36.0–46.0)
Hemoglobin: 11.4 g/dL — ABNORMAL LOW (ref 12.0–15.0)
MCH: 35.1 pg — ABNORMAL HIGH (ref 26.0–34.0)
MCHC: 34.1 g/dL (ref 30.0–36.0)
MCV: 102.8 fL — AB (ref 78.0–100.0)
PLATELETS: 172 10*3/uL (ref 150–400)
RBC: 3.25 MIL/uL — AB (ref 3.87–5.11)
RDW: 15.8 % — ABNORMAL HIGH (ref 11.5–15.5)
WBC: 2.6 10*3/uL — AB (ref 4.0–10.5)

## 2015-01-23 LAB — URINALYSIS, ROUTINE W REFLEX MICROSCOPIC
Bilirubin Urine: NEGATIVE
Glucose, UA: NEGATIVE mg/dL
KETONES UR: NEGATIVE mg/dL
LEUKOCYTES UA: NEGATIVE
Nitrite: NEGATIVE
Specific Gravity, Urine: 1.01 (ref 1.005–1.030)
UROBILINOGEN UA: 0.2 mg/dL (ref 0.0–1.0)
pH: 6 (ref 5.0–8.0)

## 2015-01-23 LAB — COMPREHENSIVE METABOLIC PANEL
ALT: 12 U/L — AB (ref 14–54)
AST: 23 U/L (ref 15–41)
Albumin: 4 g/dL (ref 3.5–5.0)
Alkaline Phosphatase: 49 U/L (ref 38–126)
Anion gap: 9 (ref 5–15)
BUN: 17 mg/dL (ref 6–20)
CALCIUM: 8 mg/dL — AB (ref 8.9–10.3)
CHLORIDE: 105 mmol/L (ref 101–111)
CO2: 25 mmol/L (ref 22–32)
CREATININE: 1.2 mg/dL — AB (ref 0.44–1.00)
GFR, EST AFRICAN AMERICAN: 51 mL/min — AB (ref >60)
GFR, EST NON AFRICAN AMERICAN: 44 mL/min — AB (ref >60)
Glucose, Bld: 115 mg/dL — ABNORMAL HIGH (ref 65–99)
Potassium: 2.9 mmol/L — ABNORMAL LOW (ref 3.5–5.1)
Sodium: 139 mmol/L (ref 135–145)
TOTAL PROTEIN: 7 g/dL (ref 6.5–8.1)
Total Bilirubin: 0.4 mg/dL (ref 0.3–1.2)

## 2015-01-23 LAB — URINE MICROSCOPIC-ADD ON

## 2015-01-23 LAB — C DIFFICILE QUICK SCREEN W PCR REFLEX
C Diff antigen: POSITIVE — AB
C Diff toxin: NEGATIVE

## 2015-01-23 LAB — LIPASE, BLOOD: LIPASE: 18 U/L — AB (ref 22–51)

## 2015-01-23 MED ORDER — POTASSIUM CHLORIDE CRYS ER 20 MEQ PO TBCR: 20.0000 meq | EXTENDED_RELEASE_TABLET | Freq: Two times a day (BID) | ORAL | Status: DC

## 2015-01-23 MED ORDER — SODIUM CHLORIDE 0.9 % IV BOLUS (SEPSIS)
500.0000 mL | Freq: Once | INTRAVENOUS | Status: AC
  Administered 2015-01-23: 500 mL via INTRAVENOUS

## 2015-01-23 MED ORDER — SODIUM CHLORIDE 0.9 % IV SOLN
INTRAVENOUS | Status: DC
  Administered 2015-01-23: 19:00:00 via INTRAVENOUS

## 2015-01-23 MED ORDER — POTASSIUM CHLORIDE CRYS ER 20 MEQ PO TBCR
40.0000 meq | EXTENDED_RELEASE_TABLET | Freq: Once | ORAL | Status: AC
  Administered 2015-01-23: 40 meq via ORAL
  Filled 2015-01-23: qty 2

## 2015-01-23 NOTE — ED Notes (Signed)
Placed a bedside commode in the room with the patient. Told her to hit the call button when she was done. Patient able to ambulate on her own.

## 2015-01-23 NOTE — Telephone Encounter (Signed)
Called home number.  "Mailbox is full."

## 2015-01-23 NOTE — Discharge Instructions (Signed)
Diarrhea °Diarrhea is frequent loose and watery bowel movements. It can cause you to feel weak and dehydrated. Dehydration can cause you to become tired and thirsty, have a dry mouth, and have decreased urination that often is dark yellow. Diarrhea is a sign of another problem, most often an infection that will not last long. In most cases, diarrhea typically lasts 2-3 days. However, it can last longer if it is a sign of something more serious. It is important to treat your diarrhea as directed by your caregiver to lessen or prevent future episodes of diarrhea. °CAUSES  °Some common causes include: °· Gastrointestinal infections caused by viruses, bacteria, or parasites. °· Food poisoning or food allergies. °· Certain medicines, such as antibiotics, chemotherapy, and laxatives. °· Artificial sweeteners and fructose. °· Digestive disorders. °HOME CARE INSTRUCTIONS °· Ensure adequate fluid intake (hydration): Have 1 cup (8 oz) of fluid for each diarrhea episode. Avoid fluids that contain simple sugars or sports drinks, fruit juices, whole milk products, and sodas. Your urine should be clear or pale yellow if you are drinking enough fluids. Hydrate with an oral rehydration solution that you can purchase at pharmacies, retail stores, and online. You can prepare an oral rehydration solution at home by mixing the following ingredients together: °·  - tsp table salt. °· ¾ tsp baking soda. °·  tsp salt substitute containing potassium chloride. °· 1  tablespoons sugar. °· 1 L (34 oz) of water. °· Certain foods and beverages may increase the speed at which food moves through the gastrointestinal (GI) tract. These foods and beverages should be avoided and include: °· Caffeinated and alcoholic beverages. °· High-fiber foods, such as raw fruits and vegetables, nuts, seeds, and whole grain breads and cereals. °· Foods and beverages sweetened with sugar alcohols, such as xylitol, sorbitol, and mannitol. °· Some foods may be well  tolerated and may help thicken stool including: °· Starchy foods, such as rice, toast, pasta, low-sugar cereal, oatmeal, grits, baked potatoes, crackers, and bagels. °· Bananas. °· Applesauce. °· Add probiotic-rich foods to help increase healthy bacteria in the GI tract, such as yogurt and fermented milk products. °· Wash your hands well after each diarrhea episode. °· Only take over-the-counter or prescription medicines as directed by your caregiver. °· Take a warm bath to relieve any burning or pain from frequent diarrhea episodes. °SEEK IMMEDIATE MEDICAL CARE IF:  °· You are unable to keep fluids down. °· You have persistent vomiting. °· You have blood in your stool, or your stools are black and tarry. °· You do not urinate in 6-8 hours, or there is only a small amount of very dark urine. °· You have abdominal pain that increases or localizes. °· You have weakness, dizziness, confusion, or light-headedness. °· You have a severe headache. °· Your diarrhea gets worse or does not get better. °· You have a fever or persistent symptoms for more than 2-3 days. °· You have a fever and your symptoms suddenly get worse. °MAKE SURE YOU:  °· Understand these instructions. °· Will watch your condition. °· Will get help right away if you are not doing well or get worse. °Document Released: 04/05/2002 Document Revised: 08/30/2013 Document Reviewed: 12/22/2011 °ExitCare® Patient Information ©2015 [employer]. This information is not intended to replace advice given to you by your health care provider. Make sure you discuss any questions you have with your health care provider. ° ° °Hypokalemia °Hypokalemia means that the amount of potassium in the blood is lower than normal. Potassium   is a chemical, called an electrolyte, that helps regulate the amount of fluid in the body. It also stimulates muscle contraction and helps nerves function properly. Most of the body's potassium is inside of cells, and only a very small amount is  in the blood. Because the amount in the blood is so small, minor changes can be life-threatening. °CAUSES °· Antibiotics. °· Diarrhea or vomiting. °· Using laxatives too much, which can cause diarrhea. °· Chronic kidney disease. °· Water pills (diuretics). °· Eating disorders (bulimia). °· Low magnesium level. °· Sweating a lot. °SIGNS AND SYMPTOMS °· Weakness. °· Constipation. °· Fatigue. °· Muscle cramps. °· Mental confusion. °· Skipped heartbeats or irregular heartbeat (palpitations). °· Tingling or numbness. °DIAGNOSIS  °Your health care provider can diagnose hypokalemia with blood tests. In addition to checking your potassium level, your health care provider may also check other lab tests. °TREATMENT °Hypokalemia can be treated with potassium supplements taken by mouth or adjustments in your current medicines. If your potassium level is very low, you may need to get potassium through a vein (IV) and be monitored in the hospital. A diet high in potassium is also helpful. Foods high in potassium are: °· Nuts, such as peanuts and pistachios. °· Seeds, such as sunflower seeds and pumpkin seeds. °· Peas, lentils, and lima beans. °· Whole grain and bran cereals and breads. °· Fresh fruit and vegetables, such as apricots, avocado, bananas, cantaloupe, kiwi, oranges, tomatoes, asparagus, and potatoes. °· Orange and tomato juices. °· Red meats. °· Fruit yogurt. °HOME CARE INSTRUCTIONS °· Take all medicines as prescribed by your health care provider. °· Maintain a healthy diet by including nutritious food, such as fruits, vegetables, nuts, whole grains, and lean meats. °· If you are taking a laxative, be sure to follow the directions on the label. °SEEK MEDICAL CARE IF: °· Your weakness gets worse. °· You feel your heart pounding or racing. °· You are vomiting or having diarrhea. °· You are diabetic and having trouble keeping your blood glucose in the normal range. °SEEK IMMEDIATE MEDICAL CARE IF: °· You have chest pain,  shortness of breath, or dizziness. °· You are vomiting or having diarrhea for more than 2 days. °· You faint. °MAKE SURE YOU:  °· Understand these instructions. °· Will watch your condition. °· Will get help right away if you are not doing well or get worse. °Document Released: 04/15/2005 Document Revised: 02/03/2013 Document Reviewed: 10/16/2012 °ExitCare® Patient Information ©2015 [employer]. This information is not intended to replace advice given to you by your health care provider. Make sure you discuss any questions you have with your health care provider. ° °

## 2015-01-23 NOTE — ED Notes (Signed)
PT c/o diarrhea, generalized weakness, hypotension and lower back pain x4 days. PT states she has cancer that has mets to the bones.

## 2015-01-23 NOTE — Telephone Encounter (Signed)
Hoyle Sauer with [facility] in [place] called for clarification of Norco prescription not dated.  Called patient's home [phone] and mobile [phone].  No answer and unable to leave messages.  Last documented prescription is 12-14-2014.  Would like to know when she received prescription and when did she take to [facility].

## 2015-01-23 NOTE — ED Notes (Signed)
Pt given fluids and crackers 

## 2015-01-23 NOTE — ED Notes (Signed)
Pt states understanding and follow up instructions.  Has appointment tomorrow with GI

## 2015-01-23 NOTE — ED Notes (Signed)
Notified Dr. Eulis Foster that pt had double vision episode today around 1530

## 2015-01-23 NOTE — Telephone Encounter (Signed)
3:13 second call from [place] at [place].  Call transferred to collaborative nurse.

## 2015-01-23 NOTE — ED Provider Notes (Signed)
CSN: [id number]     Arrival date & time 01/23/15  1651 History   First MD Initiated Contact with Patient 01/23/15 1728     Chief Complaint  Patient presents with  . Diarrhea     (Consider location/radiation/quality/duration/timing/severity/associated sxs/prior Treatment) HPI   [name] is a 69 y.o. female who is here for evaluation of weakness, increasing diarrhea, low blood pressure and malaise. Despite these problems, she was able to work, today, as a Educational psychologist. All driving to the hospital today to get her warfarin level checked, she had a transient episode of double vision. She denies focal weakness, nausea, vomiting, blurred vision, paresthesias or headache. She is taking her usual medication. She has chronic diarrhea. She plans on seeing her GI specialist, this week for further testing and treatment. There are no other known modifying factors.   Past Medical History  Diagnosis Date  . Cancer     RBreast, Lower back , Under l Arm  . Hypertension   . GERD (gastroesophageal reflux disease)   . Breast cancer metastasized to bone 04/30/2011  . Radiation 01/15/2012    Lumbosacral spine 3500 cGy 14  fx  . Swollen R ankle 08/10/2012  . Fracture of ankle, medial malleolus, right, closed 08/10/2012  . Tubular adenoma 2011  . Hiatal hernia 2011  . Schatzki's ring   . Anorexia 03/02/2013  . Weight loss due to medication 03/02/2013   Past Surgical History  Procedure Laterality Date  . Gallbladder surgery  1991  . Mastectomy  06/28/10    bilateral- Dr. Rosebud Poles, [place]  . Inguinal hernia repair  90's    right  . Parathyroidectomy  90's  . Carpal tunnel release  90's  . Oophorectomy  90's    right  . Lymph node dissection    . Breast surgery    . Cholecystectomy    . Colonoscopy  01/08/10    Dr. [name]- anal tag,hemorrhoid o/w normal rectum, pancolonic diverticula, diminutive polyp in the base of the cecum= tubular adenoma on bx. poor prep  . Esophagogastroduodenoscopy   01/08/10    Dr. [name]- normal esophagus, small hiatal hernia, antum and body erosions, pedunculated polyp between D1 and D2, duodenal diverticulum, lymphangiectasia, second portion of the duodenum.stomach bx= inflammation, duodenum bx= adenoma  . Rotator cuff repair  08/27/11    Dr. Alphonzo Cruise- [facility]- [facility]  . Esophagogastroduodenoscopy (egd) with propofol N/A 11/19/2012    RMR: non-critical Schatzki's ring s/p dilation with 85 F, multiple gastric polyps, duodenal polyp s/p piecemeal snare polypectomy, gastric polypectomy, path benign  . Maloney dilation N/A 11/19/2012    Procedure: Venia Minks DILATION;  Surgeon: Daneil Dolin, MD;  Location: [facility];  Service: Endoscopy;  Laterality: N/A;  #54  . Polypectomy N/A 11/19/2012    Procedure: POLYPECTOMY;  Surgeon: Daneil Dolin, MD;  Location: [facility];  Service: Endoscopy;  Laterality: N/A;  duodenal  . Esophageal biopsy N/A 11/19/2012    Procedure: BIOPSY;  Surgeon: Daneil Dolin, MD;  Location: [facility];  Service: Endoscopy;  Laterality: N/A;  esophageal   Family History  Problem Relation Age of Onset  . Cancer Mother     Colon  . Cancer Father     Lung, Mouth,leg   Social History  Substance Use Topics  . Smoking status: Former Smoker    Quit date: 11/22/1982  . Smokeless tobacco: Never Used     Comment: FQuit smoking x 32 years  . Alcohol Use: No   OB History  No data available     Review of Systems  All other systems reviewed and are negative.     Allergies  Aspirin; Percodan; and Codeine  Home Medications   Prior to Admission medications   Medication Sig Start Date End Date Taking? Authorizing Provider  fluticasone (FLONASE) 50 MCG/ACT nasal spray Place 2 sprays into both nostrils daily. 12/08/14   Historical Provider, MD  HYDROcodone-acetaminophen (NORCO) 10-325 MG per tablet Take 1-2 tablets by mouth every 4 (four) hours as needed for moderate pain or severe pain. 12/14/14   Chauncey Cruel, MD  letrozole [facility]) 2.5 MG tablet  Take 1 tablet (2.5 mg total) by mouth daily. 08/18/14   Chauncey Cruel, MD  lisinopril (PRINIVIL,ZESTRIL) 20 MG tablet  06/20/14   Historical Provider, MD  mometasone (ELOCON) 0.1 % lotion APP AA D UTD 10/17/14   Historical Provider, MD  ondansetron (ZOFRAN-ODT) 8 MG disintegrating tablet DISSOLVE 1 TABLET ON THE TONGUE EVERY 8 HOURS AS NEEDED FOR NAUSEA 12/16/14   Chauncey Cruel, MD  palbociclib [facility]) 125 MG capsule Take 1 capsule (125 mg total) by mouth daily. Take 21days out of 28 with Letrozole, with food 12/23/14   Chauncey Cruel, MD  pantoprazole (PROTONIX) 40 MG tablet Take 1 tablet (40 mg total) by mouth daily. 08/11/14   Reyne Dumas, MD  Polyethyl Glycol-Propyl Glycol (SYSTANE OP) Apply 1 drop to eye daily as needed (dry eyes).    Historical Provider, MD  potassium chloride SA (K-DUR,KLOR-CON) 20 MEQ tablet Take 1 tablet (20 mEq total) by mouth 2 (two) times daily. 01/23/15   Daleen Bo, MD  Vitamin D, Ergocalciferol, (DRISDOL) 50000 UNITS CAPS capsule Take 1 capsule (50,000 Units total) by mouth every 7 (seven) days. 12/14/14   Chauncey Cruel, MD  warfarin (COUMADIN) 4 MG tablet 4mg  on Mon.and,Fri., and 2 mg all other days 09/27/14   Chauncey Cruel, MD   BP 112/49 mmHg  Pulse 71  Temp(Src) 98.1 F (36.7 C) (Oral)  Resp 13  Ht 5\' 4"  (1.626 m)  Wt 132 lb (59.875 kg)  BMI 22.65 kg/m2  SpO2 94% Physical Exam  Constitutional: She is oriented to person, place, and time. She appears well-developed. No distress.  Elderly, frail  HENT:  Head: Normocephalic and atraumatic.  Right Ear: External ear normal.  Left Ear: External ear normal.  Eyes: Conjunctivae and EOM are normal. Pupils are equal, round, and reactive to light.  Neck: Normal range of motion and phonation normal. Neck supple.  Cardiovascular: Normal rate, regular rhythm and normal heart sounds.   Pulmonary/Chest: Effort normal and breath sounds normal. She exhibits no bony tenderness.  Abdominal: Soft. There is  no tenderness.  Musculoskeletal: Normal range of motion.  Neurological: She is alert and oriented to person, place, and time. No cranial nerve deficit or sensory deficit. She exhibits normal muscle tone. Coordination normal.  Skin: Skin is warm, dry and intact.  Psychiatric: She has a normal mood and affect. Her behavior is normal. Judgment and thought content normal.  Nursing note and vitals reviewed.   ED Course  Procedures (including critical care time)  Medications  sodium chloride 0.9 % bolus 500 mL (0 mLs Intravenous Stopped 01/23/15 2101)  potassium chloride SA (K-DUR,KLOR-CON) CR tablet 40 mEq (40 mEq Oral Given 01/23/15 2058)    Patient Vitals for the past 24 hrs:  BP Temp Temp src Pulse Resp SpO2  01/23/15 2118 (!) 112/49 mmHg 98.1 F (36.7 C) Oral 71 13 94 %  01/23/15 1918 106/60 mmHg 97.6 F (36.4 C) Oral 61 15 96 %  01/23/15 1900 (!) 111/54 mmHg - - 65 - 100 %    At D/C Reevaluation with update and discussion. After initial assessment and treatment, an updated evaluation reveals she feels better after IVF, and BP is improved. Findings discussed with patient. She has a GI f/u appt in the AM. WENTZ,ELLIOTT L    Labs Review Labs Reviewed  C DIFFICILE QUICK SCREEN W PCR REFLEX - Abnormal; Notable for the following:    C Diff antigen POSITIVE (*)    All other components within normal limits  COMPREHENSIVE METABOLIC PANEL - Abnormal; Notable for the following:    Potassium 2.9 (*)    Glucose, Bld 115 (*)    Creatinine, Ser 1.20 (*)    Calcium 8.0 (*)    ALT 12 (*)    GFR calc non Af Amer 44 (*)    GFR calc Af Amer 51 (*)    All other components within normal limits  CBC - Abnormal; Notable for the following:    WBC 2.6 (*)    RBC 3.25 (*)    Hemoglobin 11.4 (*)    HCT 33.4 (*)    MCV 102.8 (*)    MCH 35.1 (*)    RDW 15.8 (*)    All other components within normal limits  URINALYSIS, ROUTINE W REFLEX MICROSCOPIC (NOT AT [facility]) - Abnormal; Notable for the  following:    Hgb urine dipstick LARGE (*)    Protein, ur TRACE (*)    All other components within normal limits  LIPASE, BLOOD - Abnormal; Notable for the following:    Lipase 18 (*)    All other components within normal limits  URINE MICROSCOPIC-ADD ON - Abnormal; Notable for the following:    Squamous Epithelial / LPF FEW (*)    Bacteria, UA FEW (*)    Casts HYALINE CASTS (*)    All other components within normal limits  URINE CULTURE  STOOL CULTURE  GI PATHOGEN PANEL BY PCR, STOOL    Imaging Review No results found. I have personally reviewed and evaluated these images and lab results as part of my medical decision-making.   EKG Interpretation None      MDM   Final diagnoses:  Diarrhea  Hypokalemia    Diarrhea, nonspecific and secondary hypokalemia. Doubt active C. Diff. Infection. Doubt SBI, metabolic instability or impending vascular collapse.  Nursing Notes Reviewed/ Care Coordinated Applicable Imaging Reviewed Interpretation of Laboratory Data incorporated into ED treatment  The patient appears reasonably screened and/or stabilized for discharge and I doubt any other medical condition or other [facility] requiring further screening, evaluation, or treatment in the ED at this time prior to discharge.  Plan: Home Medications-Rx Potassium, Kaopectate for diarrhea; Home Treatments- rest, fluids; return here if the recommended treatment, does not improve the symptoms; Recommended follow up- GI in AM, PCP in 1 week for potassium check     Daleen Bo, MD 01/24/15 1816

## 2015-01-23 NOTE — Telephone Encounter (Signed)
Called mobile number.  "This person has not set up mailbox yet."

## 2015-01-24 ENCOUNTER — Inpatient Hospital Stay (HOSPITAL_COMMUNITY)
Admission: AD | Admit: 2015-01-24 | Discharge: 2015-01-28 | DRG: 371 | Disposition: A | Discharge status: Home / Self Care | Payer: Medicare PPO | Source: Ambulatory Visit | Attending: Family Medicine | Admitting: Family Medicine

## 2015-01-24 ENCOUNTER — Ambulatory Visit (INDEPENDENT_AMBULATORY_CARE_PROVIDER_SITE_OTHER): Payer: Medicare PPO | Admitting: Internal Medicine

## 2015-01-24 ENCOUNTER — Encounter: Payer: Self-pay | Admitting: Internal Medicine

## 2015-01-24 ENCOUNTER — Inpatient Hospital Stay (HOSPITAL_COMMUNITY): Payer: Medicare PPO

## 2015-01-24 VITALS — BP 127/79 | HR 75 | Temp 97.5°F | Ht 64.0 in | Wt 136.6 lb

## 2015-01-24 DIAGNOSIS — R109 Unspecified abdominal pain: Secondary | ICD-10-CM | POA: Insufficient documentation

## 2015-01-24 DIAGNOSIS — Z87891 Personal history of nicotine dependence: Secondary | ICD-10-CM | POA: Diagnosis not present

## 2015-01-24 DIAGNOSIS — E860 Dehydration: Secondary | ICD-10-CM | POA: Diagnosis present

## 2015-01-24 DIAGNOSIS — C7951 Secondary malignant neoplasm of bone: Secondary | ICD-10-CM | POA: Diagnosis present

## 2015-01-24 DIAGNOSIS — T451X5A Adverse effect of antineoplastic and immunosuppressive drugs, initial encounter: Secondary | ICD-10-CM | POA: Diagnosis present

## 2015-01-24 DIAGNOSIS — B9689 Other specified bacterial agents as the cause of diseases classified elsewhere: Secondary | ICD-10-CM | POA: Diagnosis not present

## 2015-01-24 DIAGNOSIS — A047 Enterocolitis due to Clostridium difficile: Principal | ICD-10-CM | POA: Diagnosis present

## 2015-01-24 DIAGNOSIS — Z809 Family history of malignant neoplasm, unspecified: Secondary | ICD-10-CM

## 2015-01-24 DIAGNOSIS — E43 Unspecified severe protein-calorie malnutrition: Secondary | ICD-10-CM | POA: Diagnosis present

## 2015-01-24 DIAGNOSIS — N179 Acute kidney failure, unspecified: Secondary | ICD-10-CM | POA: Diagnosis present

## 2015-01-24 DIAGNOSIS — E876 Hypokalemia: Secondary | ICD-10-CM | POA: Diagnosis present

## 2015-01-24 DIAGNOSIS — C50911 Malignant neoplasm of unspecified site of right female breast: Secondary | ICD-10-CM | POA: Diagnosis present

## 2015-01-24 DIAGNOSIS — K219 Gastro-esophageal reflux disease without esophagitis: Secondary | ICD-10-CM | POA: Diagnosis present

## 2015-01-24 DIAGNOSIS — Z923 Personal history of irradiation: Secondary | ICD-10-CM | POA: Diagnosis not present

## 2015-01-24 DIAGNOSIS — R197 Diarrhea, unspecified: Secondary | ICD-10-CM | POA: Diagnosis present

## 2015-01-24 DIAGNOSIS — C50919 Malignant neoplasm of unspecified site of unspecified female breast: Secondary | ICD-10-CM | POA: Diagnosis not present

## 2015-01-24 DIAGNOSIS — I10 Essential (primary) hypertension: Secondary | ICD-10-CM | POA: Diagnosis present

## 2015-01-24 DIAGNOSIS — R1030 Lower abdominal pain, unspecified: Secondary | ICD-10-CM

## 2015-01-24 DIAGNOSIS — Z6823 Body mass index (BMI) 23.0-23.9, adult: Secondary | ICD-10-CM | POA: Diagnosis not present

## 2015-01-24 DIAGNOSIS — D701 Agranulocytosis secondary to cancer chemotherapy: Secondary | ICD-10-CM | POA: Diagnosis present

## 2015-01-24 DIAGNOSIS — Z7901 Long term (current) use of anticoagulants: Secondary | ICD-10-CM

## 2015-01-24 DIAGNOSIS — D630 Anemia in neoplastic disease: Secondary | ICD-10-CM | POA: Diagnosis present

## 2015-01-24 DIAGNOSIS — R1084 Generalized abdominal pain: Secondary | ICD-10-CM

## 2015-01-24 DIAGNOSIS — R627 Adult failure to thrive: Secondary | ICD-10-CM | POA: Diagnosis present

## 2015-01-24 DIAGNOSIS — A0472 Enterocolitis due to Clostridium difficile, not specified as recurrent: Secondary | ICD-10-CM

## 2015-01-24 DIAGNOSIS — A498 Other bacterial infections of unspecified site: Secondary | ICD-10-CM

## 2015-01-24 LAB — CBC WITH DIFFERENTIAL/PLATELET
BASOS PCT: 3 %
Basophils Absolute: 0.1 10*3/uL (ref 0.0–0.1)
EOS ABS: 0 10*3/uL (ref 0.0–0.7)
EOS PCT: 1 %
HCT: 32.1 % — ABNORMAL LOW (ref 36.0–46.0)
HEMOGLOBIN: 11.1 g/dL — AB (ref 12.0–15.0)
LYMPHS ABS: 0.2 10*3/uL — AB (ref 0.7–4.0)
Lymphocytes Relative: 9 %
MCH: 35 pg — AB (ref 26.0–34.0)
MCHC: 34.6 g/dL (ref 30.0–36.0)
MCV: 101.3 fL — ABNORMAL HIGH (ref 78.0–100.0)
MONO ABS: 0.5 10*3/uL (ref 0.1–1.0)
MONOS PCT: 22 %
NEUTROS PCT: 65 %
Neutro Abs: 1.6 10*3/uL — ABNORMAL LOW (ref 1.7–7.7)
Platelets: 172 10*3/uL (ref 150–400)
RBC: 3.17 MIL/uL — ABNORMAL LOW (ref 3.87–5.11)
RDW: 15.5 % (ref 11.5–15.5)
WBC: 2.5 10*3/uL — ABNORMAL LOW (ref 4.0–10.5)

## 2015-01-24 LAB — COMPREHENSIVE METABOLIC PANEL
ALK PHOS: 45 U/L (ref 38–126)
ALT: 12 U/L — AB (ref 14–54)
AST: 20 U/L (ref 15–41)
Albumin: 3.9 g/dL (ref 3.5–5.0)
Anion gap: 8 (ref 5–15)
BUN: 12 mg/dL (ref 6–20)
CALCIUM: 8.2 mg/dL — AB (ref 8.9–10.3)
CHLORIDE: 110 mmol/L (ref 101–111)
CO2: 25 mmol/L (ref 22–32)
CREATININE: 0.63 mg/dL (ref 0.44–1.00)
GFR calc Af Amer: >60 mL/min (ref >60)
GFR calc non Af Amer: >60 mL/min (ref >60)
GLUCOSE: 100 mg/dL — AB (ref 65–99)
Potassium: 3.2 mmol/L — ABNORMAL LOW (ref 3.5–5.1)
SODIUM: 143 mmol/L (ref 135–145)
Total Bilirubin: 0.6 mg/dL (ref 0.3–1.2)
Total Protein: 7 g/dL (ref 6.5–8.1)

## 2015-01-24 LAB — MAGNESIUM: MAGNESIUM: 1.4 mg/dL — AB (ref 1.7–2.4)

## 2015-01-24 MED ORDER — POTASSIUM CHLORIDE CRYS ER 20 MEQ PO TBCR
20.0000 meq | EXTENDED_RELEASE_TABLET | Freq: Two times a day (BID) | ORAL | Status: DC
  Administered 2015-01-24 – 2015-01-28 (×8): 20 meq via ORAL
  Filled 2015-01-24 (×9): qty 1

## 2015-01-24 MED ORDER — HEPARIN SODIUM (PORCINE) 5000 UNIT/ML IJ SOLN
5000.0000 [IU] | Freq: Three times a day (TID) | INTRAMUSCULAR | Status: DC
  Administered 2015-01-25 – 2015-01-26 (×3): 5000 [IU] via SUBCUTANEOUS
  Filled 2015-01-24 (×4): qty 1

## 2015-01-24 MED ORDER — HYDROCODONE-ACETAMINOPHEN 10-325 MG PO TABS
1.0000 | ORAL_TABLET | ORAL | Status: DC | PRN
Start: 2015-01-24 — End: 2015-01-25
  Administered 2015-01-24 (×2): 1 via ORAL
  Administered 2015-01-25 (×2): 2 via ORAL
  Filled 2015-01-24 (×2): qty 2
  Filled 2015-01-24 (×2): qty 1

## 2015-01-24 MED ORDER — LETROZOLE 2.5 MG PO TABS
2.5000 mg | ORAL_TABLET | Freq: Every day | ORAL | Status: DC
  Filled 2015-01-24 (×3): qty 1

## 2015-01-24 MED ORDER — VANCOMYCIN 50 MG/ML ORAL SOLUTION
250.0000 mg | Freq: Four times a day (QID) | ORAL | Status: DC
  Administered 2015-01-24: 250 mg via ORAL
  Filled 2015-01-24 (×3): qty 5

## 2015-01-24 MED ORDER — LISINOPRIL 10 MG PO TABS
20.0000 mg | ORAL_TABLET | Freq: Every day | ORAL | Status: DC
  Administered 2015-01-25: 20 mg via ORAL
  Filled 2015-01-24: qty 2

## 2015-01-24 MED ORDER — SODIUM CHLORIDE 0.9 % IV SOLN
INTRAVENOUS | Status: DC
  Administered 2015-01-24 – 2015-01-25 (×2): via INTRAVENOUS

## 2015-01-24 MED ORDER — RISAQUAD PO CAPS
2.0000 | ORAL_CAPSULE | Freq: Every day | ORAL | Status: DC
  Administered 2015-01-25: 2 via ORAL
  Filled 2015-01-24: qty 2

## 2015-01-24 MED ORDER — PANTOPRAZOLE SODIUM 40 MG PO TBEC
40.0000 mg | DELAYED_RELEASE_TABLET | Freq: Every day | ORAL | Status: DC
Start: 2015-01-25 — End: 2015-01-25
  Administered 2015-01-25: 40 mg via ORAL
  Filled 2015-01-24: qty 1

## 2015-01-24 MED ORDER — ONDANSETRON 4 MG PO TBDP
8.0000 mg | ORAL_TABLET | Freq: Three times a day (TID) | ORAL | Status: DC | PRN
  Administered 2015-01-24 – 2015-01-28 (×3): 8 mg via ORAL
  Filled 2015-01-24 (×3): qty 2

## 2015-01-24 MED ORDER — VANCOMYCIN 50 MG/ML ORAL SOLUTION
ORAL | Status: AC
  Filled 2015-01-24: qty 5

## 2015-01-24 MED ORDER — SODIUM CHLORIDE 0.9 % IJ SOLN
3.0000 mL | Freq: Two times a day (BID) | INTRAMUSCULAR | Status: DC
  Administered 2015-01-25 – 2015-01-28 (×5): 3 mL via INTRAVENOUS

## 2015-01-24 NOTE — Progress Notes (Signed)
ANTIBIOTIC CONSULT NOTE-Preliminary  Pharmacy Consult for Vancomycin Oral Indication: C. Diff Diarrhea  Allergies  Allergen Reactions  . Aspirin   . Percodan [Oxycodone-Aspirin] Nausea Only  . Codeine Nausea Only    Patient Measurements: Height: 5\' 4"  (162.6 cm) Weight: 134 lb 2 oz (60.839 kg) IBW/kg (Calculated) : 54.7  Vital Signs: Temp: 98.9 F (37.2 C) (09/27 1737) BP: 138/78 mmHg (09/27 1737) Pulse Rate: 70 (09/27 1737)  Labs:  Recent Labs  01/23/15 1722 01/24/15 1921  WBC 2.6* 2.5*  HGB 11.4* 11.1*  PLT 172 172  CREATININE 1.20*  --     Estimated Creatinine Clearance: 37.1 mL/min (by C-G formula based on Cr of 1.2).  No results for input(s): VANCOTROUGH, VANCOPEAK, VANCORANDOM, GENTTROUGH, GENTPEAK, GENTRANDOM, TOBRATROUGH, TOBRAPEAK, TOBRARND, AMIKACINPEAK, AMIKACINTROU, AMIKACIN in the last 72 hours.   Microbiology: Recent Results (from the past 720 hour(s))  Urine culture     Status: None (Preliminary result)   Collection Time: 01/23/15  6:30 PM  Result Value Ref Range Status   Specimen Description PERIRECTAL  Final   Special Requests Normal  Final   Culture   Final    CULTURE REINCUBATED FOR BETTER GROWTH Performed at [facility]    Report Status PENDING  Incomplete  C difficile quick scan w PCR reflex     Status: Abnormal   Collection Time: 01/23/15  6:30 PM  Result Value Ref Range Status   C Diff antigen POSITIVE (A) NEGATIVE Final   C Diff toxin NEGATIVE NEGATIVE Final   C Diff interpretation   Final    C. difficile present, but toxin not detected. This indicates colonization. In most cases, this does not require treatment. If patient has signs and symptoms consistent with colitis, consider treatment.    Medical History: Past Medical History  Diagnosis Date  . Cancer     RBreast, Lower back , Under l Arm  . Hypertension   . GERD (gastroesophageal reflux disease)   . Breast cancer metastasized to bone 04/30/2011  . Radiation  01/15/2012    Lumbosacral spine 3500 cGy 14  fx  . Swollen R ankle 08/10/2012  . Fracture of ankle, medial malleolus, right, closed 08/10/2012  . Tubular adenoma 2011  . Hiatal hernia 2011  . Schatzki's ring   . Anorexia 03/02/2013  . Weight loss due to medication 03/02/2013   Assessment: Admitted with diarrhea.  Asked to initiate oral vancomycin for c. Diff diarrhea  Goal of Therapy:  Eradicate infection  Plan:  Preliminary review of pertinent patient information completed.  Protocol will be initiated with Vancomycin 250mg  PO QID.   Forestine Na clinical pharmacist will complete review during morning rounds to assess patient and finalize treatment regimen.  Ena Dawley, [facility] 01/24/2015,8:01 PM

## 2015-01-24 NOTE — Addendum Note (Signed)
Encounter addended by: Jacqulyn Liner, RN on: 01/24/2015 11:22 AM<BR>     Documentation filed: Charges VN

## 2015-01-24 NOTE — Progress Notes (Signed)
Primary Care Physician:  Curlene Labrum, MD Primary Gastroenterologist:  Dr. [name]  Pre-Procedure History & Physical: HPI:  [name] is a 69 y.o. female here for follow-up of GERD and a new problem of diarrhea. History of relatively long-standing metastatic breast cancer. I last saw her in December of last year. At that time, she was having leg cramps and the possibility of an idiosyncratic drug reaction between PPI and chemotherapy agents was entertained. PPI therapy was interrupted and she was started on Carafate. We have not seen her back until now.  She tells me that she was admitted to [facility] back in April with a cavitary pneumonia and subsequently transferred  to [facility]. She's been on multiple rounds of antibiotics. Since that time, she has had watery diarrhea, nausea and early satiety. She tells me she has to force herself to eat. Early Satiety noted;  11 Pound Weight Loss Based on Our Scales since December of Last Year.  She saw her PCP in [facility] yesterday. She tells me her INR was 4.4 there. She looked ill and was sent to the ED at [facility]. She was seen by Dr. Eulis Foster. Her potassium was found to be 2.9. She was leukopenic. She reportedly received IV potassium. She's not been able to take oral potassium. Was prescribed. GI pathogen panel was sent off. C. Difficile assay came back positive for antigen but negative for toxin. She reports she was discharged on oral potassium and Kaopectate last evening. He GI pathogen panel remains pending. She has been taking Lomotil and Imodium at different times. Apparently, her diarrhea has not been evaluated until last night. She states she has not contacted [place] regarding her diarrhea until she was seen today   Unfortunately, she tells me that she developed bone metastases in her left femur and has been getting bilateral thigh irradiation by Dr. Sondra Come.  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  She worked as recently as Administrator, Civil Service.  Past Medical History  Diagnosis Date  . Cancer     RBreast, Lower back , Under l Arm  . Hypertension   . GERD (gastroesophageal reflux disease)   . Breast cancer metastasized to bone 04/30/2011  . Radiation 01/15/2012    Lumbosacral spine 3500 cGy 14  fx  . Swollen R ankle 08/10/2012  . Fracture of ankle, medial malleolus, right, closed 08/10/2012  . Tubular adenoma 2011  . Hiatal hernia 2011  . Schatzki's ring   . Anorexia 03/02/2013  . Weight loss due to medication 03/02/2013    Past Surgical History  Procedure Laterality Date  . Gallbladder surgery  1991  . Mastectomy  06/28/10    bilateral- Dr. Rosebud Poles, [place]  . Inguinal hernia repair  90's    right  . Parathyroidectomy  90's  . Carpal tunnel release  90's  . Oophorectomy  90's    right  . Lymph node dissection    . Breast surgery    . Cholecystectomy    . Colonoscopy  01/08/10    Dr. [name]- anal tag,hemorrhoid o/w normal rectum, pancolonic diverticula, diminutive polyp in the base of the cecum= tubular adenoma on bx. poor prep  . Esophagogastroduodenoscopy  01/08/10    Dr. [name]- normal esophagus, small hiatal hernia, antum and body erosions, pedunculated polyp between D1 and D2, duodenal diverticulum, lymphangiectasia, second portion of the duodenum.stomach bx= inflammation, duodenum bx= adenoma  . Rotator cuff repair  08/27/11    Dr. Alphonzo Cruise- [facility]- [facility] Noss  . Esophagogastroduodenoscopy (egd) with propofol N/A 11/19/2012    RMR: non-critical Schatzki's ring s/p dilation with 47 F, multiple gastric polyps, duodenal polyp s/p piecemeal snare polypectomy, gastric polypectomy, path benign  . Maloney dilation N/A 11/19/2012    Procedure: Venia Minks DILATION;  Surgeon: Daneil Dolin, MD;  Location: [facility];  Service: Endoscopy;  Laterality: N/A;  #54  . Polypectomy N/A 11/19/2012    Procedure: POLYPECTOMY;  Surgeon: Daneil Dolin, MD;  Location: [facility];  Service:  Endoscopy;  Laterality: N/A;  duodenal  . Esophageal biopsy N/A 11/19/2012    Procedure: BIOPSY;  Surgeon: Daneil Dolin, MD;  Location: [facility];  Service: Endoscopy;  Laterality: N/A;  esophageal    Prior to Admission medications   Medication Sig Start Date End Date Taking? Authorizing Provider  fluticasone (FLONASE) 50 MCG/ACT nasal spray Place 2 sprays into both nostrils daily. 12/08/14  Yes Historical Provider, MD  HYDROcodone-acetaminophen (NORCO) 10-325 MG per tablet Take 1-2 tablets by mouth every 4 (four) hours as needed for moderate pain or severe pain. 12/14/14  Yes Chauncey Cruel, MD  letrozole [facility]) 2.5 MG tablet Take 1 tablet (2.5 mg total) by mouth daily. 08/18/14  Yes Chauncey Cruel, MD  lisinopril (PRINIVIL,ZESTRIL) 20 MG tablet  06/20/14  Yes Historical Provider, MD  mometasone (ELOCON) 0.1 % lotion APP AA D UTD 10/17/14  Yes Historical Provider, MD  ondansetron (ZOFRAN-ODT) 8 MG disintegrating tablet DISSOLVE 1 TABLET ON THE TONGUE EVERY 8 HOURS AS NEEDED FOR NAUSEA 12/16/14  Yes Chauncey Cruel, MD  palbociclib [facility]) 125 MG capsule Take 1 capsule (125 mg total) by mouth daily. Take 21days out of 28 with Letrozole, with food 12/23/14  Yes Chauncey Cruel, MD  pantoprazole (PROTONIX) 40 MG tablet Take 1 tablet (40 mg total) by mouth daily. 08/11/14  Yes Reyne Dumas, MD  Polyethyl Glycol-Propyl Glycol (SYSTANE OP) Apply 1 drop to eye daily as needed (dry eyes).   Yes Historical Provider, MD  potassium chloride SA (K-DUR,KLOR-CON) 20 MEQ tablet Take 1 tablet (20 mEq total) by mouth 2 (two) times daily. 01/23/15  Yes Daleen Bo, MD  Vitamin D, Ergocalciferol, (DRISDOL) 50000 UNITS CAPS capsule Take 1 capsule (50,000 Units total) by mouth every 7 (seven) days. 12/14/14  Yes Chauncey Cruel, MD  warfarin (COUMADIN) 4 MG tablet 4mg  on Mon.and,Fri., and 2 mg all other days 09/27/14  Yes Chauncey Cruel, MD    Allergies as of 01/24/2015 - Review Complete 01/24/2015    Allergen Reaction Noted  . Aspirin  11/13/2012  . Percodan [oxycodone-aspirin] Nausea Only   . Codeine Nausea Only     Family History  Problem Relation Age of Onset  . Cancer Mother     Colon  . Cancer Father     Lung, Mouth,leg    Social History   Social History  . Marital Status: Widowed    Spouse Name: N/A  . Number of Children: N/A  . Years of Education: N/A   Occupational History  . Not on file.   Social History Main Topics  . Smoking status: Former Smoker    Quit date: 11/22/1982  . Smokeless tobacco: Never Used     Comment: FQuit smoking x 32 years  . Alcohol Use: No  . Drug Use: No  . Sexual Activity: Not on file   Other Topics Concern  . Not on file   Social History Narrative  Review of Systems: See HPI, otherwise negative ROS  Physical Exam: BP 127/79 mmHg  Pulse 75  Temp(Src) 97.5 F (36.4 C)  Ht 5\' 4"  (1.626 m)  Wt 136 lb 9.6 oz (61.961 kg)  BMI 23.44 kg/m2 General:   She appears gaunt, significantly more ill than when I saw her last in December of last year.She is accompanied by her friend.  Blood pressure lying 140/80 and pulse 60; standing 130/84 and pulse 64 Skin:  Intact without significant lesions or rashes. Eyes:  Sclera clear, no icterus.   Conjunctiva pink.  No scleral icterus Neck:  Supple; no masses or thyromegaly. No significant cervical adenopathy. Lungs:  Clear throughout to auscultation.   No wheezes, crackles, or rhonchi. No acute distress. Heart:  Regular rate and rhythm; no murmurs, clicks, rubs,  or gallops. Abdomen: Non-distended. Hyperactive bowel sounds. Very mild diffuse tenderness to palpation diffusely.  Soft and nontender without appreciable mass or hepatosplenomegaly.  Pulses:  Normal pulses noted. Extremities:  Without clubbing or edema.  Impression:  Pleasant 69 year old lady with a rather long-standing history of metastatic breast cancer with at a least a three-month history of non-bloody watery diarrhea in  the setting of pneumonia earlier this year, Multiple antibiotic exposure. She is hypokalemic and coagulopathic. She is leukopenic / immunosuppressed.  She appears ill. Hospitalization warranted.  Clostridium difficile antigen positive but toxin negative. In this setting, she has a Clostridium difficile infection. She has vague early satiety and esophageal dysphagia of uncertain significance. She continues to be able to get fluids and her oral medications down although she has not started her potassium supplement which was recently prescribed.   Recommendations:  Hospitalization. I discussed the case with Drs. Memon and Merrill. They have kindly agreed to accept the patient for direct admit. I would anticipate her being started on oral vancomycin so long as she can tolerate. Admission labs to be drawn. IV fluid hydration, etc. Would withhold PPI therapy and anti-motility agents (has been taking Lomotil and Imodium). We will follow along while she is hospitalized. Further recommendations to follow.      Notice: This dictation was prepared with Dragon dictation along with smaller phrase technology. Any transcriptional errors that result from this process are unintentional and may not be corrected upon review.

## 2015-01-24 NOTE — Patient Instructions (Signed)
Go to the hospital for direct admission per plan

## 2015-01-24 NOTE — H&P (Signed)
[facility] History and Physical  [name] CZY:[id number] DOB: Jul 29, 1945 DOA: 01/24/2015  Referring physician: Dr Sydell Axon - GI PCP: Curlene Labrum, MD   Chief Complaint: Abd pain and diarrhea  HPI: [name] is a 69 y.o. female  Patient [facility] as a direct admission from Dr. Sydell Axon. Patient was seen in his clinic today as a part of a routine follow-up. Patient expressed concern over her current acute illness for she is being admitted. Patient states that for the last 5 days she's had progressive intermittent ongoing abdominal pain. This is generalized in nature. She had very little oral intake during this period of time she's had very little appetite. Patient states that she has very loose watery diarrhea 6-7 times per day. Stools turned blackish green today. Patient has used Imodium and Kaopectate without improvement in symptoms. [facility]  Of note patient is currently on chemotherapy ongoing metastatic disease. Patient has had multiple rounds of antibiotics since April for which she was treated for cavitary pneumonia at [facility].  Review of Systems:  Constitutional:  No weight loss, night sweats, Fevers, chills HEENT:  No headaches, Difficulty swallowing,Tooth/dental problems,Sore throat,  No sneezing, itching, ear ache, nasal congestion, post nasal drip,  Cardio-vascular:  No chest pain, Orthopnea, PND, swelling in lower extremities, anasarca, dizziness, palpitations  GI: Per HPi Resp:   No shortness of breath with exertion or at rest. No excess mucus, no productive cough, No non-productive cough, No coughing up of blood.No change in color of mucus.No wheezing.No chest wall deformity  Skin:  no rash or lesions.  GU:  no dysuria, change in color of urine, no urgency or frequency. No flank pain.  Psych:  No change in mood or affect. No depression or  anxiety. No memory loss.   Past Medical History  Diagnosis Date  . Cancer     RBreast, Lower back , Under l Arm  . Hypertension   . GERD (gastroesophageal reflux disease)   . Breast cancer metastasized to bone 04/30/2011  . Radiation 01/15/2012    Lumbosacral spine 3500 cGy 14  fx  . Swollen R ankle 08/10/2012  . Fracture of ankle, medial malleolus, right, closed 08/10/2012  . Tubular adenoma 2011  . Hiatal hernia 2011  . Schatzki's ring   . Anorexia 03/02/2013  . Weight loss due to medication 03/02/2013   Past Surgical History  Procedure Laterality Date  . Gallbladder surgery  1991  . Mastectomy  06/28/10    bilateral- Dr. Rosebud Poles, [place]  . Inguinal hernia repair  90's    right  . Parathyroidectomy  90's  . Carpal tunnel release  90's  . Oophorectomy  90's    right  . Lymph node dissection    . Breast surgery    . Cholecystectomy    . Colonoscopy  01/08/10    Dr. [name]- anal tag,hemorrhoid o/w normal rectum, pancolonic diverticula, diminutive polyp in the base of the cecum= tubular adenoma on bx. poor prep  . Esophagogastroduodenoscopy  01/08/10    Dr. [name]- normal esophagus, small hiatal hernia, antum and body erosions, pedunculated polyp between D1 and D2, duodenal diverticulum, lymphangiectasia, second portion of the duodenum.stomach bx= inflammation, duodenum bx= adenoma  . Rotator cuff repair  08/27/11    Dr. Alphonzo Cruise- [facility]- [facility] Noss  . Esophagogastroduodenoscopy (egd) with propofol N/A 11/19/2012    RMR: non-critical Schatzki's ring s/p dilation with 80 F,  multiple gastric polyps, duodenal polyp s/p piecemeal snare polypectomy, gastric polypectomy, path benign  . Maloney dilation N/A 11/19/2012    Procedure: Venia Minks DILATION;  Surgeon: Daneil Dolin, MD;  Location: [facility];  Service: Endoscopy;  Laterality: N/A;  #[id number]  . Polypectomy N/A 11/19/2012    Procedure: POLYPECTOMY;  Surgeon: Daneil Dolin, MD;  Location: [facility];  Service: Endoscopy;  Laterality: N/A;  duodenal    . Esophageal biopsy N/A 11/19/2012    Procedure: BIOPSY;  Surgeon: Daneil Dolin, MD;  Location: [facility];  Service: Endoscopy;  Laterality: N/A;  esophageal   Social History:  reports that she quit smoking about 32 years ago. She has never used smokeless tobacco. She reports that she does not drink alcohol or use illicit drugs.  Allergies  Allergen Reactions  . Aspirin   . Percodan [Oxycodone-Aspirin] Nausea Only  . Codeine Nausea Only    Family History  Problem Relation Age of Onset  . Cancer Mother     Colon  . Cancer Father     Lung, Mouth,leg     Prior to Admission medications   Medication Sig Start Date End Date Taking? Authorizing Provider  fluticasone (FLONASE) 50 MCG/ACT nasal spray Place 2 sprays into both nostrils daily. 12/08/14   Historical Provider, MD  HYDROcodone-acetaminophen (NORCO) 10-325 MG per tablet Take 1-2 tablets by mouth every 4 (four) hours as needed for moderate pain or severe pain. 12/14/14   Chauncey Cruel, MD  letrozole [facility]) 2.5 MG tablet Take 1 tablet (2.5 mg total) by mouth daily. 08/18/14   Chauncey Cruel, MD  lisinopril (PRINIVIL,ZESTRIL) 20 MG tablet  06/20/14   Historical Provider, MD  mometasone (ELOCON) 0.1 % lotion APP AA D UTD 10/17/14   Historical Provider, MD  ondansetron (ZOFRAN-ODT) 8 MG disintegrating tablet DISSOLVE 1 TABLET ON THE TONGUE EVERY 8 HOURS AS NEEDED FOR NAUSEA 12/16/14   Chauncey Cruel, MD  palbociclib [facility]) 125 MG capsule Take 1 capsule (125 mg total) by mouth daily. Take 21days out of 28 with Letrozole, with food 12/23/14   Chauncey Cruel, MD  pantoprazole (PROTONIX) 40 MG tablet Take 1 tablet (40 mg total) by mouth daily. 08/11/14   Reyne Dumas, MD  Polyethyl Glycol-Propyl Glycol (SYSTANE OP) Apply 1 drop to eye daily as needed (dry eyes).    Historical Provider, MD  potassium chloride SA (K-DUR,KLOR-CON) 20 MEQ tablet Take 1 tablet (20 mEq total) by mouth 2 (two) times daily. 01/23/15   Daleen Bo, MD   Vitamin D, Ergocalciferol, (DRISDOL) 50000 UNITS CAPS capsule Take 1 capsule (50,000 Units total) by mouth every 7 (seven) days. 12/14/14   Chauncey Cruel, MD  warfarin (COUMADIN) 4 MG tablet 4mg  on Mon.and,Fri., and 2 mg all other days 09/27/14   Chauncey Cruel, MD   Physical Exam: Filed Vitals:   01/24/15 1737  BP: 138/78  Pulse: 70  Temp: 98.9 F (37.2 C)  Resp: 20  Height: 5\' 4"  (1.626 m)  Weight: 60.839 kg (134 lb 2 oz)  SpO2: 99%    Wt Readings from Last 3 Encounters:  01/24/15 60.839 kg (134 lb 2 oz)  01/24/15 61.961 kg (136 lb 9.6 oz)  01/23/15 59.875 kg (132 lb)    General:  Appears thin and frail Eyes:  PERRL, normal lids, irises & conjunctiva ENT:  grossly normal hearing, lips & tongue Neck:  no LAD, masses or thyromegaly Cardiovascular:  RRR, II/VI systolic murmur. No LE edema. Telemetry:  SR,  no arrhythmias  Respiratory:  CTA bilaterally, no w/r/r. Normal respiratory effort. Abdomen:  soft, ntnd Skin:  no rash or induration seen on limited exam Musculoskeletal:  grossly normal tone BUE/BLE Psychiatric:  grossly normal mood and affect, speech fluent and appropriate Neurologic:  grossly non-focal.          Labs on Admission:  Basic Metabolic Panel:  Recent Labs Lab 01/23/15 1722  NA 139  K 2.9*  CL 105  CO2 25  GLUCOSE 115*  BUN 17  CREATININE 1.20*  CALCIUM 8.0*   Liver Function Tests:  Recent Labs Lab 01/23/15 1722  AST 23  ALT 12*  ALKPHOS 49  BILITOT 0.4  PROT 7.0  ALBUMIN 4.0    Recent Labs Lab 01/23/15 1722  LIPASE 18*   No results for input(s): AMMONIA in the last 168 hours. CBC:  Recent Labs Lab 01/23/15 1722  WBC 2.6*  HGB 11.4*  HCT 33.4*  MCV 102.8*  PLT 172   Cardiac Enzymes: No results for input(s): CKTOTAL, CKMB, CKMBINDEX, TROPONINI in the last 168 hours.  BNP (last 3 results) No results for input(s): BNP in the last 8760 hours.  ProBNP (last 3 results) No results for input(s): PROBNP in the last  8760 hours.  CBG: No results for input(s): GLUCAP in the last 168 hours.  Radiological Exams on Admission: No results found.   Assessment/Plan Active Problems:   GERD   Breast cancer metastasized to bone   Dehydration   Hypokalemia   AKI (acute kidney injury)   Essential hypertension    Abdominal pain and diarrhea: Pt direct admit by Dr. Sydell Axon for concern for possible Cdiff infection in neurtopenic pt. Multiple rounds of ABX since April for which pt was treated for cavitary pneumonia at [facility]. Patient is currently on oral chemotherapy. Patient's C. difficile antigen was positive but C. difficile toxin was negative 1 day ago. Patient is afebrile vital signs are stable. Likely dehydrated. Lipase nml.  - MedSurg - Oral Vanc  - f/u stool Cx - BCX - probiotic - enteric precautions - clear liquid diet and ADAT - KUB  AKI: Cr. 1.2. Baseline 0.8. Likely from poor oral intake and dehydration. - BMET in am - IVF  HypoK: K 2.9 on 01/23/15. Unable to pick up Arpelar from pharmacy since ED evaluation on day prior to admission - K - Mag - replete as necessary  Metastatic Breast Cancer: CUrretnly on radiation and oral chemotherapy. Curretnly of Ibrance cycle but on Femara. Followed at [facility]. - continue Femara - f/u outpt  HTN: - continue lisinopril  GERD: - continue protonix  Code Status: FULL DVT Prophylaxis: Hep Family Communication: None Disposition Plan: Pending improvement  MERRELL, DAVID Lenna Sciara, MD Family Medicine [facility] [email or web address] Password [email or web address]

## 2015-01-25 ENCOUNTER — Encounter (HOSPITAL_COMMUNITY): Payer: Self-pay | Admitting: *Deleted

## 2015-01-25 DIAGNOSIS — A0472 Enterocolitis due to Clostridium difficile, not specified as recurrent: Secondary | ICD-10-CM

## 2015-01-25 DIAGNOSIS — A047 Enterocolitis due to Clostridium difficile: Principal | ICD-10-CM

## 2015-01-25 DIAGNOSIS — R197 Diarrhea, unspecified: Secondary | ICD-10-CM

## 2015-01-25 DIAGNOSIS — E43 Unspecified severe protein-calorie malnutrition: Secondary | ICD-10-CM

## 2015-01-25 DIAGNOSIS — C7951 Secondary malignant neoplasm of bone: Secondary | ICD-10-CM

## 2015-01-25 DIAGNOSIS — C50919 Malignant neoplasm of unspecified site of unspecified female breast: Secondary | ICD-10-CM

## 2015-01-25 DIAGNOSIS — R109 Unspecified abdominal pain: Secondary | ICD-10-CM

## 2015-01-25 DIAGNOSIS — N179 Acute kidney failure, unspecified: Secondary | ICD-10-CM

## 2015-01-25 LAB — BASIC METABOLIC PANEL
Anion gap: 5 (ref 5–15)
BUN: 9 mg/dL (ref 6–20)
CHLORIDE: 114 mmol/L — AB (ref 101–111)
CO2: 25 mmol/L (ref 22–32)
CREATININE: 0.54 mg/dL (ref 0.44–1.00)
Calcium: 7.4 mg/dL — ABNORMAL LOW (ref 8.9–10.3)
GFR calc Af Amer: >60 mL/min (ref >60)
GFR calc non Af Amer: >60 mL/min (ref >60)
GLUCOSE: 100 mg/dL — AB (ref 65–99)
POTASSIUM: 3.6 mmol/L (ref 3.5–5.1)
SODIUM: 144 mmol/L (ref 135–145)

## 2015-01-25 LAB — CBC
HEMATOCRIT: 29.3 % — AB (ref 36.0–46.0)
HEMOGLOBIN: 10.1 g/dL — AB (ref 12.0–15.0)
MCH: 34.9 pg — AB (ref 26.0–34.0)
MCHC: 34.5 g/dL (ref 30.0–36.0)
MCV: 101.4 fL — AB (ref 78.0–100.0)
Platelets: 152 10*3/uL (ref 150–400)
RBC: 2.89 MIL/uL — AB (ref 3.87–5.11)
RDW: 15.5 % (ref 11.5–15.5)
WBC: 2.3 10*3/uL — ABNORMAL LOW (ref 4.0–10.5)

## 2015-01-25 LAB — GI PATHOGEN PANEL BY PCR, STOOL
C difficile toxin A/B: NOT DETECTED
Campylobacter by PCR: NOT DETECTED
Cryptosporidium by PCR: NOT DETECTED
E COLI (ETEC) LT/ST: NOT DETECTED
E COLI 0157 BY PCR: NOT DETECTED
E coli (STEC): NOT DETECTED
G LAMBLIA BY PCR: NOT DETECTED
NOROVIRUS G1/G2: NOT DETECTED
Rotavirus A by PCR: NOT DETECTED
SALMONELLA BY PCR: NOT DETECTED
SHIGELLA BY PCR: NOT DETECTED

## 2015-01-25 LAB — URINE CULTURE

## 2015-01-25 LAB — PROTIME-INR
INR: 2.1 — ABNORMAL HIGH (ref 0.00–1.49)
PROTHROMBIN TIME: 23.4 s — AB (ref 11.6–15.2)

## 2015-01-25 MED ORDER — SACCHAROMYCES BOULARDII 250 MG PO CAPS
250.0000 mg | ORAL_CAPSULE | Freq: Two times a day (BID) | ORAL | Status: DC
  Administered 2015-01-25 – 2015-01-28 (×6): 250 mg via ORAL
  Filled 2015-01-25 (×6): qty 1

## 2015-01-25 MED ORDER — WARFARIN SODIUM 2 MG PO TABS
2.0000 mg | ORAL_TABLET | Freq: Once | ORAL | Status: AC
  Administered 2015-01-25: 2 mg via ORAL
  Filled 2015-01-25: qty 1

## 2015-01-25 MED ORDER — BOOST / RESOURCE BREEZE PO LIQD
1.0000 | Freq: Two times a day (BID) | ORAL | Status: DC
  Administered 2015-01-25 – 2015-01-27 (×4): 1 via ORAL

## 2015-01-25 MED ORDER — VANCOMYCIN 50 MG/ML ORAL SOLUTION
125.0000 mg | Freq: Four times a day (QID) | ORAL | Status: DC
  Administered 2015-01-25 – 2015-01-28 (×14): 125 mg via ORAL
  Filled 2015-01-25 (×22): qty 2.5

## 2015-01-25 MED ORDER — LETROZOLE 2.5 MG PO TABS
2.5000 mg | ORAL_TABLET | Freq: Every day | ORAL | Status: DC
  Administered 2015-01-25 – 2015-01-26 (×2): 2.5 mg via ORAL
  Filled 2015-01-25 (×5): qty 1

## 2015-01-25 MED ORDER — HYDROCODONE-ACETAMINOPHEN 10-325 MG PO TABS
2.0000 | ORAL_TABLET | Freq: Four times a day (QID) | ORAL | Status: DC
  Administered 2015-01-25 – 2015-01-28 (×12): 2 via ORAL
  Filled 2015-01-25 (×12): qty 2

## 2015-01-25 MED ORDER — WARFARIN - PHARMACIST DOSING INPATIENT
Status: DC
Start: 2015-01-26 — End: 2015-01-28
  Administered 2015-01-26 – 2015-01-27 (×2)

## 2015-01-25 MED ORDER — HYDROMORPHONE HCL 1 MG/ML IJ SOLN: 1.0000 mg | INTRAMUSCULAR | Status: DC | PRN

## 2015-01-25 NOTE — Progress Notes (Signed)
Initial Nutrition Assessment  DOCUMENTATION CODES:  Severe malnutrition in context of acute illness/injury  INTERVENTION:  Boost Breeze po BID, each supplement provides 250 kcal and 9 grams of protein  NUTRITION DIAGNOSIS:  Inadequate oral intake related to poor appetite as evidenced by loss of 5 % bw in 3 weeks and an intake of <50% estimated needs for > 5 days. Marland Kitchen  GOAL:  Patient will meet greater than or equal to 90% of their needs  MONITOR:  PO intake, Supplement acceptance, Diet advancement, Labs, Weight trends, I & O's  REASON FOR ASSESSMENT:  Malnutrition Screening Tool    ASSESSMENT:  69 y/o female PMHx Metastatic Cancer (undergoing treatment), HTN, GERD presents with abdominal pain x5 days. Very little intake in that time. Has had 6-7 loose watery stools each day. Treated for dehydration.   Pt was feeling very sick on RD arrival and was trying to sleep. Only briefly spoke with her.   She states that for the past few weeks she has been eating "hardly anything". She reports thatshe has not had any n/v. Her main issue was diarrhea. which has slightly improved since admission. There was no food she was interested in receiving. She does not like Ensure/Boost/glucerna. She was agreeable to trying [employer]  She said she used to weigh 195 lbs. Per documentation, Pt has had ongoing weight loss  for the past 6 years, likely related to her malignancy. She had dx of anorexia and wt loss related to medications in 2014.  Recently she appears to have lost 14 lbs in the last 4 months and 7 lbs these last few weeks (pt believes she has lost more than this)   Diet Order:  Diet full liquid Room service appropriate?: Yes; Fluid consistency:: Thin  Skin:  Reviewed, no issues  Last BM:  9/28-diarrhea  Height:  Ht Readings from Last 1 Encounters:  01/24/15 5\' 4"  (1.626 m)   Weight:  Wt Readings from Last 1 Encounters:  01/24/15 134 lb 2 oz (60.839 kg)   Wt Readings from Last 10  Encounters:  01/24/15 134 lb 2 oz (60.839 kg)  01/24/15 136 lb 9.6 oz (61.961 kg)  01/23/15 132 lb (59.875 kg)  12/29/14 139 lb 6.4 oz (63.231 kg)  12/14/14 141 lb (63.957 kg)  09/27/14 141 lb 11.2 oz (64.275 kg)  09/09/14 146 lb 11.2 oz (66.543 kg)  08/09/14 135 lb (61.236 kg)  06/21/14 148 lb 3.2 oz (67.223 kg)  04/19/14 147 lb 3.2 oz (66.769 kg)  Admit weight 132 lbs.   Ideal Body Weight:  54.54 kg  BMI:  Body mass index is 23.01 kg/(m^2).  Estimated Nutritional Needs:  Kcal:  1800-2000 (30-33 kcal/kg) Protein:  72-90 (1.2-1.5 g/kg bw) Fluid:  1.8-2 liters fluid + enough to replace stool losses  EDUCATION NEEDS:  No education needs identified at this time  Burtis Junes RD, LDN Nutrition Pager: [phone] 01/25/2015 1:40 PM

## 2015-01-25 NOTE — Progress Notes (Signed)
  COURTESY NOTE: Ms Mennen has stage IV breast cancer currently well controlled on letrozole, palbiciclib and denosumab. The letrozole should be continued but the palbociclib can be held (it may lower counts), to be resumed as outpatient.  I am copying a summary of her case, below. She has a follow-up appt w me at the [place] [facility]  Please let me know if I can be of further help.  CASE SUMMARY: 69 y.o. [place], [place] woman status post right lumpectomy and axillary lymph node dissection December 2006 for a TX N2, stage IIIA invasive ductal carcinoma, estrogen and progesterone receptor positive, with subsequent stage IV disease  (1) received adjuvant doxorubicin, docetaxel and cyclophosphamide  (2) status post adjuvant radiation  (3) on tamoxifen to September 2011 when she was noted to have metastatic disease to bone  (4) fulvestrant 02/06/2009 to 01/15/2011  (5) left axillary lymph node positive for an invasive ductal carcinoma, estrogen receptor and HER-2 negative November 2011, and a right breast recurrence for an estrogen receptor positive lesion at the same time.  (6) status post bilateral mastectomies 06/28/2010  (7) status post left chest and axillary radiation  (8) status post right chest wall recurrence October of 2012, treated with electron beam radiation completed December 2012  (9) progression in bone August 2013,  (10) status post radiation to the lumbosacral spine completed October 2013   (11) capecitabine November 2013 through January 2014  (12) tamoxifen/ everolimus February 2014, discontinued November 2014 with progression  (13) Megace 80 mg twice daily with progression January 2015  (14) Radiation to lumbar spine February 2015  (15) letrozole plus Ibrance started 07/08/2013  (16). Declines genetic testing (as of 08/17/2013)  (17) zolendronate 05/01/2009, discontinued because of pain; Xgeva started 10/08/2013, discontinued because of cost issues as of  November 2015, switched to alendronate March 2016, switched back to [place] in May 2016, given every 2 or 3 months depending on the patient's visit frequency  OTHER ACTIVE PROBLEMS:  (1) chronic GI complaints, followed by Dr. Sydell Axon  (2) multiple orthopedic problems, including bilateral chronic foot pain, status post rotator cuff repair  (3) history of extensive superficial phlebitis, on chronic low dose Coumadin.  (4) LUL cavitary pneumonia diagnosed as MRSA while at [facility] April 2016, treated with Zosyn x 21 days

## 2015-01-25 NOTE — Progress Notes (Signed)
PROGRESS NOTE  [name] YFV:[id number] DOB: 05-Aug-1945 DOA: 01/24/2015 PCP: Curlene Labrum, MD  Summary: Patient is a 69 yo female with history of metastatic breast cancer, GERD, HTN and recent PNA presented with abdominal pain and diarrhea. Patient received multiple rounds of abx since April for cavitary PNA. Of note, patient is currently on chemotherapy for ongoing metastatic disease. While in the ED labs revealed AKI and hypokalemia. Patient was admitted for further evaluation.  Assessment/Plan: 1. Abdominal pain and subacute diarrhea, possible cdiff colitis. Patient had multiple rounds of abx since April for cavitary pneumonia. Currently on oral chemotherapy. C. difficile antigen was positive but C. difficile toxin was negative, but GI recommends oral vancomycin and pt seems to be clinicaly responding. Final stool cultures still pending. Afebrile. WBC 2.3. Lipase 18. 2. Dehydration in the setting of poor oral intake. Vague early satiety and esophageal dysphagia of uncertain significance. 3. AKI. Resolved, secondary to diarrhea, FTT, poor appetite. 4. Hypokalemia. Improved. 5. Leukopenia, likely secondary to chemotherapy, stable.  6. Anemia of malignancy, stable.  7. 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 Currently on radiation and oral chemotherapy. Per Dr. [name]: The letrozole should be continued but the palbociclib can be held (it may lower counts), to be resumed as outpatient. Has f/u 10/12.  8. HTN. Stable 9. GERD.  10. Severe malnutrition    Overall a bit better, hemodynamics stable. Plan to continue vancomycin as per GI.  Decrease IVF.  CBC in AM  No PPI.  Code Status: FULL DVT Prophylaxis: Hep Family Communication: None Disposition Plan: Pending improvement  Murray Hodgkins, MD  [facility]  Pager [phone] If 7PM-7AM, please contact night-coverage at [email or web address], password [facility] 01/25/2015, 7:28 AM  LOS: 1 day    Consultants:  GI  Procedures:    Antibiotics:  Oral vancomycin 9/28 >>  HPI/Subjective: Feels ok, some nausea, no vomiting, poor appetite. Diarrhea improved.  Objective: Filed Vitals:   01/24/15 1737 01/24/15 2300  BP: 138/78 114/50  Pulse: 70 65  Temp: 98.9 F (37.2 C) 98.1 F (36.7 C)  Resp: 20 20  Height: 5\' 4"  (1.626 m)   Weight: 60.839 kg (134 lb 2 oz)   SpO2: 99% 95%   No intake or output data in the 24 hours ending 01/25/15 0728   Filed Weights   01/24/15 1737  Weight: 60.839 kg (134 lb 2 oz)    Exam: Afebrile, VSS, nonhypoxic General:  Appears calm and comfortable Cardiovascular: RRR, no m/r/g. No LE edema. Respiratory: CTA bilaterally, no w/r/r. Normal respiratory effort. Abdomen: soft, ntnd Psychiatric: grossly normal mood and affect, speech fluent and appropriate  New data reviewed:  BMP unremarkable  CBC without significant change, WBC 2.3, Hgb 10.1, plts 152  Pertinent data since admission:  Abdominal XR: Bowel gas pattern unremarkable.  Pending data:  UC  Stool cultures  Scheduled Meds: . acidophilus  2 capsule Oral Daily  . heparin  5,000 Units Subcutaneous 3 times per day  . letrozole  2.5 mg Oral Daily  . lisinopril  20 mg Oral Daily  . pantoprazole  40 mg Oral Daily  . potassium chloride SA  20 mEq Oral BID  . sodium chloride  3 mL Intravenous Q12H  . vancomycin  250 mg Oral QID   Continuous Infusions: . sodium chloride 100 mL/hr at 01/24/15 1900    Principal Problem:   Diarrhea Active Problems:   GERD   Breast cancer metastasized to bone   Dehydration   Hypokalemia  AKI (acute kidney injury)   Essential hypertension   Abdominal pain   Clostridium difficile diarrhea   Protein-calorie malnutrition, severe   Time spent 20 minutes   By signing my name below, I, Rhett Bannister attest that this documentation has been prepared under the direction and in the presence of Murray Hodgkins, M.D.   Electronically  signed: Rhett Bannister  01/25/2015   I personally performed the services described in this documentation. All medical record entries made by the scribe were at my direction. I have reviewed the chart and agree that the record reflects my personal performance and is accurate and complete. Murray Hodgkins, MD

## 2015-01-25 NOTE — Care Management Note (Signed)
Case Management Note  Patient Details  Name: [name] MRN: [id number] Date of Birth: March 14, 1946  Subjective/Objective:                  Pt admitted from home with dehydration, hypokalemia. Pt lives alone and will return home at discharge. Pt is independent with ADL's.  Action/Plan: No CM needs noted.  Expected Discharge Date:  01/28/15               Expected Discharge Plan:  Home/Self Care  In-House Referral:  NA  Discharge planning Services  CM Consult  Post Acute Care Choice:  NA Choice offered to:  NA  DME Arranged:    DME Agency:     HH Arranged:    HH Agency:     Status of Service:  Completed, signed off  Medicare Important Message Given:    Date Medicare IM Given:    Medicare IM give by:    Date Additional Medicare IM Given:    Additional Medicare Important Message give by:     If discussed at [place] of Stay Meetings, dates discussed:    Additional Comments:  Joylene Draft, RN 01/25/2015, 11:19 AM

## 2015-01-25 NOTE — Progress Notes (Signed)
Subjective:  Feels a little better. No abdominal pain, vomiting. Some nausea. Some brbpr on toilet tissue. Slight improvement in number of stools.   Objective: Vital signs in last 24 hours: Temp:  [97.5 F (36.4 C)-98.9 F (37.2 C)] 98.1 F (36.7 C) (09/27 2300) Pulse Rate:  [65-75] 65 (09/27 2300) Resp:  [20] 20 (09/27 2300) BP: (114-138)/(50-79) 114/50 mmHg (09/27 2300) SpO2:  [95 %-99 %] 95 % (09/27 2300) Weight:  [134 lb 2 oz (60.839 kg)-136 lb 9.6 oz (61.961 kg)] 134 lb 2 oz (60.839 kg) (09/27 1737) Last BM Date: 01/25/15 General:   Alert,  Well-developed, well-nourished, pleasant and cooperative in NAD Head:  Normocephalic and atraumatic. Eyes:  Sclera clear, no icterus.  Abdomen:  Soft, nontender and nondistended.   without guarding, and without rebound.   Extremities:  Without clubbing, deformity or edema. Neurologic:  Alert and  oriented x4;  grossly normal neurologically. Skin:  Intact without significant lesions or rashes. Psych:  Alert and cooperative. Normal mood and affect.  Intake/Output from previous day:   Intake/Output this shift:    Lab Results: CBC  Recent Labs  01/23/15 1722 01/24/15 1921 01/25/15 0638  WBC 2.6* 2.5* 2.3*  HGB 11.4* 11.1* 10.1*  HCT 33.4* 32.1* 29.3*  MCV 102.8* 101.3* 101.4*  PLT 172 172 152   BMET  Recent Labs  01/23/15 1722 01/24/15 1921 01/25/15 0638  NA 139 143 144  K 2.9* 3.2* 3.6  CL 105 110 114*  CO2 25 25 25   GLUCOSE 115* 100* 100*  BUN 17 12 9   CREATININE 1.20* 0.63 0.54  CALCIUM 8.0* 8.2* 7.4*   LFTs  Recent Labs  01/23/15 1722 01/24/15 1921  BILITOT 0.4 0.6  ALKPHOS 49 45  AST 23 20  ALT 12* 12*  PROT 7.0 7.0  ALBUMIN 4.0 3.9    Recent Labs  01/23/15 1722  LIPASE 18*   PT/INR No results for input(s): LABPROT, INR in the last 72 hours.    Imaging Studies: Dg Abd 1 View  01/24/2015   CLINICAL DATA:  Two week history of lower abdominal pain and diarrhea; nausea  EXAM: ABDOMEN - 1 VIEW   COMPARISON:  None.  FINDINGS: There is no bowel dilatation or air-fluid level suggesting obstruction. No free air. There are small phleboliths in the pelvis.  IMPRESSION: Bowel gas pattern unremarkable.   Electronically Signed   By: Lowella Grip III M.D.   On: 01/24/2015 20:04   Dg Femur Min 2 Views Left  12/30/2014   CLINICAL DATA:  Left femur pain.  History breast cancer.  EXAM: LEFT FEMUR 2 VIEWS  COMPARISON:  None.  FINDINGS: Mild degenerative changes are present in the left knee. Hip and knee are located. There is no significant effusion. No focal lytic or blastic lesions are present.  IMPRESSION: 1. Mild degenerative changes in the left knee. 2. No evidence for metastatic disease.   Electronically Signed   By: San Morelle M.D.   On: 12/30/2014 08:20  [2 weeks]   Assessment: 69 y/o female with history of metastatic breast cancer with several month h/o non-bloody watery diarrhea in setting of pneumonia earlier this year and multiple antibiotic exposure. Her Cdiff antigen is positive but negative toxin however in this setting would recommend treatment. Other stools pending. Slight improvement on oral vanc.   Plan: 1. Appreciate Dr. Virgie Dad input.  2. F/u pending stools. 3. Continue oral vancomycin.  4. D/C pantoprazole in setting of C.diff.  5. Continue probiotics.  6.  Continue to follow closely with you.  Laureen Ochs. Bernarda Caffey [facility] [phone] 9/28/20169:08 AM     LOS: 1 day

## 2015-01-25 NOTE — Progress Notes (Signed)
ANTICOAGULATION CONSULT NOTE - Initial Consult  Pharmacy Consult for coumadin Indication: extensive superficial phlebitis  Allergies  Allergen Reactions  . Aspirin   . Percodan [Oxycodone-Aspirin] Nausea Only  . Codeine Nausea Only    Patient Measurements: Height: 5\' 4"  (162.6 cm) Weight: 134 lb 2 oz (60.839 kg) IBW/kg (Calculated) : 54.7   Vital Signs: Temp: 98.4 F (36.9 C) (09/28 1551) BP: 121/56 mmHg (09/28 1551) Pulse Rate: 55 (09/28 1551)  Labs:  Recent Labs  01/23/15 1722 01/24/15 1921 01/25/15 0638 01/25/15 1557  HGB 11.4* 11.1* 10.1*  --   HCT 33.4* 32.1* 29.3*  --   PLT 172 172 152  --   LABPROT  --   --   --  23.4*  INR  --   --   --  2.10*  CREATININE 1.20* 0.63 0.54  --     Estimated Creatinine Clearance: 55.7 mL/min (by C-G formula based on Cr of 0.54).   Medical History: Past Medical History  Diagnosis Date  . Cancer     RBreast, Lower back , Under l Arm  . Hypertension   . GERD (gastroesophageal reflux disease)   . Breast cancer metastasized to bone 04/30/2011  . Radiation 01/15/2012    Lumbosacral spine 3500 cGy 14  fx  . Swollen R ankle 08/10/2012  . Fracture of ankle, medial malleolus, right, closed 08/10/2012  . Tubular adenoma 2011  . Hiatal hernia 2011  . Schatzki's ring   . Anorexia 03/02/2013  . Weight loss due to medication 03/02/2013    Medications:  Prescriptions prior to admission  Medication Sig Dispense Refill Last Dose  . fluticasone (FLONASE) 50 MCG/ACT nasal spray Place 2 sprays into both nostrils daily as needed for allergies or rhinitis.   1 Past Week at Unknown time  . HYDROcodone-acetaminophen (NORCO) 10-325 MG per tablet Take 1-2 tablets by mouth every 4 (four) hours as needed for moderate pain or severe pain. 360 tablet 0 01/24/2015 at 1215  . letrozole (FEMARA) 2.5 MG tablet Take 1 tablet (2.5 mg total) by mouth daily. 90 tablet 4 01/22/2015 at 1400  . lisinopril (PRINIVIL,ZESTRIL) 20 MG tablet Take 20 mg by mouth  daily.   3 01/23/2015 at Unknown time  . mometasone (ELOCON) 0.1 % lotion apply topically daily as needed for itching  5 Past Week at Unknown time  . ondansetron (ZOFRAN-ODT) 8 MG disintegrating tablet DISSOLVE 1 TABLET ON THE TONGUE EVERY 8 HOURS AS NEEDED FOR NAUSEA 20 tablet 0 01/22/2015 at Unknown time  . palbociclib (IBRANCE) 125 MG capsule Take 1 capsule (125 mg total) by mouth daily. Take 21days out of 28 with Letrozole, with food 21 capsule 3 Past Week at Unknown time  . Polyethyl Glycol-Propyl Glycol (SYSTANE OP) Apply 1 drop to eye daily as needed (dry eyes).   Past Month at Unknown time  . Vitamin D, Ergocalciferol, (DRISDOL) 50000 UNITS CAPS capsule Take 50,000 Units by mouth every Thursday.  30 capsule  01/19/2015  . warfarin (COUMADIN) 4 MG tablet 4mg  on Mon.and,Fri., and 2 mg all other days (Patient taking differently: Take 4 mg by mouth daily. ) 90 tablet 4 01/22/2015 at 1400  . potassium chloride SA (K-DUR,KLOR-CON) 20 MEQ tablet Take 1 tablet (20 mEq total) by mouth 2 (two) times daily. 10 tablet 0 unknown    Assessment: 69 yo lady to continue coumadin for extensive superficial phlebitis.  IR today is 2.10.  She had some blood noted on her toilet tissue but no overt bleeding  noted. Goal of Therapy:  INR 2-2.5 Monitor platelets by anticoagulation protocol: Yes   Plan:  Coumadin 2 mg po today Daily PT/INR.   Excell Seltzer Poteet 01/25/2015,4:34 PM

## 2015-01-25 NOTE — Progress Notes (Signed)
ANTIBIOTIC CONSULT NOTE- follow up  Pharmacy Consult for Vancomycin Oral Indication: C. Diff Diarrhea  Allergies  Allergen Reactions  . Aspirin   . Percodan [Oxycodone-Aspirin] Nausea Only  . Codeine Nausea Only   Patient Measurements: Height: 5\' 4"  (162.6 cm) Weight: 134 lb 2 oz (60.839 kg) IBW/kg (Calculated) : 54.7  Vital Signs: Temp: 98.1 F (36.7 C) (09/27 2300) BP: 114/50 mmHg (09/27 2300) Pulse Rate: 65 (09/27 2300)  Labs:  Recent Labs  01/23/15 1722 01/24/15 1921 01/25/15 0638  WBC 2.6* 2.5* 2.3*  HGB 11.4* 11.1* 10.1*  PLT 172 172 152  CREATININE 1.20* 0.63 0.54   Estimated Creatinine Clearance: 55.7 mL/min (by C-G formula based on Cr of 0.54).  No results for input(s): VANCOTROUGH, VANCOPEAK, VANCORANDOM, GENTTROUGH, GENTPEAK, GENTRANDOM, TOBRATROUGH, TOBRAPEAK, TOBRARND, AMIKACINPEAK, AMIKACINTROU, AMIKACIN in the last 72 hours.   Microbiology: Recent Results (from the past 720 hour(s))  Urine culture     Status: None (Preliminary result)   Collection Time: 01/23/15  6:30 PM  Result Value Ref Range Status   Specimen Description PERIRECTAL  Final   Special Requests Normal  Final   Culture   Final    CULTURE REINCUBATED FOR BETTER GROWTH Performed at [facility]    Report Status PENDING  Incomplete  C difficile quick scan w PCR reflex     Status: Abnormal   Collection Time: 01/23/15  6:30 PM  Result Value Ref Range Status   C Diff antigen POSITIVE (A) NEGATIVE Final   C Diff toxin NEGATIVE NEGATIVE Final   C Diff interpretation   Final    C. difficile present, but toxin not detected. This indicates colonization. In most cases, this does not require treatment. If patient has signs and symptoms consistent with colitis, consider treatment.   Medical History: Past Medical History  Diagnosis Date  . Cancer     RBreast, Lower back , Under l Arm  . Hypertension   . GERD (gastroesophageal reflux disease)   . Breast cancer metastasized to bone  04/30/2011  . Radiation 01/15/2012    Lumbosacral spine 3500 cGy 14  fx  . Swollen R ankle 08/10/2012  . Fracture of ankle, medial malleolus, right, closed 08/10/2012  . Tubular adenoma 2011  . Hiatal hernia 2011  . Schatzki's ring   . Anorexia 03/02/2013  . Weight loss due to medication 03/02/2013   Assessment: Admitted with diarrhea.  Asked to initiate oral vancomycin for c. Diff diarrhea Pt received initial Vancomycin dose on admission.   Goal of Therapy:  Eradicate infection  Plan:  Vancomycin 125mg  PO 4 times daily Monitor progress and tolerance Anticipate 14 days therapy  Ena Dawley, RPH 01/25/2015,8:07 AM

## 2015-01-26 LAB — BASIC METABOLIC PANEL
Anion gap: 3 — ABNORMAL LOW (ref 5–15)
BUN: 5 mg/dL — AB (ref 6–20)
CHLORIDE: 116 mmol/L — AB (ref 101–111)
CO2: 25 mmol/L (ref 22–32)
Calcium: 7 mg/dL — ABNORMAL LOW (ref 8.9–10.3)
Creatinine, Ser: 0.54 mg/dL (ref 0.44–1.00)
GFR calc Af Amer: >60 mL/min (ref >60)
GFR calc non Af Amer: >60 mL/min (ref >60)
GLUCOSE: 97 mg/dL (ref 65–99)
POTASSIUM: 3.4 mmol/L — AB (ref 3.5–5.1)
Sodium: 144 mmol/L (ref 135–145)

## 2015-01-26 LAB — PROTIME-INR
INR: 2.04 — ABNORMAL HIGH (ref 0.00–1.49)
PROTHROMBIN TIME: 22.9 s — AB (ref 11.6–15.2)

## 2015-01-26 MED ORDER — WARFARIN SODIUM 2 MG PO TABS
2.0000 mg | ORAL_TABLET | Freq: Once | ORAL | Status: AC
  Administered 2015-01-26: 2 mg via ORAL
  Filled 2015-01-26: qty 1

## 2015-01-26 NOTE — Progress Notes (Signed)
ANTICOAGULATION CONSULT NOTE - follow up  Pharmacy Consult for coumadin Indication: extensive superficial phlebitis  Allergies  Allergen Reactions  . Aspirin   . Percodan [Oxycodone-Aspirin] Nausea Only  . Codeine Nausea Only   Patient Measurements: Height: 5\' 4"  (162.6 cm) Weight: 134 lb 2 oz (60.839 kg) IBW/kg (Calculated) : 54.7  Vital Signs: Temp: 98.7 F (37.1 C) (09/29 0549) Temp Source: Oral (09/29 0549) BP: 119/67 mmHg (09/29 0549) Pulse Rate: 60 (09/29 0549)  Labs:  Recent Labs  01/23/15 1722 01/24/15 1921 01/25/15 6295 01/25/15 1557 01/26/15 0601  HGB 11.4* 11.1* 10.1*  --   --   HCT 33.4* 32.1* 29.3*  --   --   PLT 172 172 152  --   --   LABPROT  --   --   --  23.4* 22.9*  INR  --   --   --  2.10* 2.04*  CREATININE 1.20* 0.63 0.54  --  0.54    Estimated Creatinine Clearance: 55.7 mL/min (by C-G formula based on Cr of 0.54).  Medical History: Past Medical History  Diagnosis Date  . Cancer     RBreast, Lower back , Under l Arm  . Hypertension   . GERD (gastroesophageal reflux disease)   . Breast cancer metastasized to bone 04/30/2011  . Radiation 01/15/2012    Lumbosacral spine 3500 cGy 14  fx  . Swollen R ankle 08/10/2012  . Fracture of ankle, medial malleolus, right, closed 08/10/2012  . Tubular adenoma 2011  . Hiatal hernia 2011  . Schatzki's ring   . Anorexia 03/02/2013  . Weight loss due to medication 03/02/2013   Medications:  Prescriptions prior to admission  Medication Sig Dispense Refill Last Dose  . fluticasone (FLONASE) 50 MCG/ACT nasal spray Place 2 sprays into both nostrils daily as needed for allergies or rhinitis.   1 Past Week at Unknown time  . HYDROcodone-acetaminophen (NORCO) 10-325 MG per tablet Take 1-2 tablets by mouth every 4 (four) hours as needed for moderate pain or severe pain. 360 tablet 0 01/24/2015 at 1215  . letrozole (FEMARA) 2.5 MG tablet Take 1 tablet (2.5 mg total) by mouth daily. 90 tablet 4 01/22/2015 at 1400  .  lisinopril (PRINIVIL,ZESTRIL) 20 MG tablet Take 20 mg by mouth daily.   3 01/23/2015 at Unknown time  . mometasone (ELOCON) 0.1 % lotion apply topically daily as needed for itching  5 Past Week at Unknown time  . ondansetron (ZOFRAN-ODT) 8 MG disintegrating tablet DISSOLVE 1 TABLET ON THE TONGUE EVERY 8 HOURS AS NEEDED FOR NAUSEA 20 tablet 0 01/22/2015 at Unknown time  . palbociclib (IBRANCE) 125 MG capsule Take 1 capsule (125 mg total) by mouth daily. Take 21days out of 28 with Letrozole, with food 21 capsule 3 Past Week at Unknown time  . Polyethyl Glycol-Propyl Glycol (SYSTANE OP) Apply 1 drop to eye daily as needed (dry eyes).   Past Month at Unknown time  . Vitamin D, Ergocalciferol, (DRISDOL) 50000 UNITS CAPS capsule Take 50,000 Units by mouth every Thursday.  30 capsule  01/19/2015  . warfarin (COUMADIN) 4 MG tablet 4mg  on Mon.and,Fri., and 2 mg all other days (Patient taking differently: Take 4 mg by mouth daily. ) 90 tablet 4 01/22/2015 at 1400  . potassium chloride SA (K-DUR,KLOR-CON) 20 MEQ tablet Take 1 tablet (20 mEq total) by mouth 2 (two) times daily. 10 tablet 0 unknown    Assessment: 69 yo lady to continue coumadin for extensive superficial phlebitis.  INR today is  therapeutic.  She had some blood noted on her toilet tissue but no overt bleeding noted.  Goal of Therapy:  INR 2-2.5 Monitor platelets by anticoagulation protocol: Yes   Plan:  Coumadin 2 mg po today Daily PT/INR.   [place] Crane, Scott A 01/26/2015,10:23 AM

## 2015-01-26 NOTE — Progress Notes (Signed)
PROGRESS NOTE  [name]:[id number] DOB: 1945-10-21 DOA: 01/24/2015 PCP: Curlene Labrum, MD  Summary: Patient is a 69 yo female with history of metastatic breast cancer, GERD, HTN and recent PNA presented with abdominal pain and diarrhea. Patient received multiple rounds of abx since April for cavitary PNA. Of note, patient is currently on chemotherapy for ongoing metastatic disease. While in the ED labs revealed AKI and hypokalemia. Patient was admitted for further evaluation.  Assessment/Plan: 1. Cdiff colitis with abdominal pain and diarrhea, present on admission. Improving with less diarrhea and improved oral intake. Multiple rounds of abx since April for cavitary pneumonia. Currently on oral chemotherapy. C. difficile antigen was positive but C. difficile toxin was negative.  GI following and agrees with current plan.  Final stool cultures still pending. Afebrile.  2. Dehydration in the setting of poor oral intake, improving. Vague early satiety and esophageal dysphagia of uncertain significance. 3. AKI. Resolved, secondary to diarrhea, FTT, poor appetite. 4. Hypokalemia, improving. Plan repletion. 5. Leukopenia, likely secondary to chemotherapy, stable.  6. Anemia of malignancy, stable.  7. 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 Currently on radiation and oral chemotherapy. Per Dr. [name]: The letrozole should be continued but the palbociclib can be held (it may lower counts), to be resumed as outpatient. Has f/u 10/12.  8. HTN. Stable 9. GERD.  10. Severe malnutrition, improving. Nutrition consulted.    Overall improved with less diarrhea and improved oral intake  Continue oral Vancomycin as per GI.   Anticipate discharge to SNF 9/30  Code Status: FULL DVT Prophylaxis: Heparin Family Communication: Discussed with patient who understands and has no concerns at this time. Disposition Plan: Anticipate discharge to SNF 9/30  Murray Hodgkins, MD  [facility]  Pager [phone] If 7PM-7AM, please contact night-coverage at [email or web address], password [facility] 01/26/2015, 7:01 AM  LOS: 2 days   Consultants:  GI  Hematology  Procedures:    Antibiotics:  Oral vancomycin 9/28 >>  HPI/Subjective: Feeling better today. Abdominal pain with cramping character. 2 episodes of diarrhea today. No diarrhea last night. Diarrhea less frequent, appetite improved.  No nausea or vomiting  Objective: Filed Vitals:   01/24/15 2300 01/25/15 1551 01/25/15 2101 01/26/15 0549  BP: 114/50 121/56 103/49 119/67  Pulse: 65 55 63 60  Temp: 98.1 F (36.7 C) 98.4 F (36.9 C) 98.6 F (37 C) 98.7 F (37.1 C)  TempSrc:   Oral Oral  Resp: 20 20 18 18   Height:      Weight:      SpO2: 95% 99% 97% 100%    Intake/Output Summary (Last 24 hours) at 01/26/15 0701 Last data filed at 01/25/15 1700  Gross per 24 hour  Intake    840 ml  Output      0 ml  Net    840 ml     Filed Weights   01/24/15 1737  Weight: 60.839 kg (134 lb 2 oz)    Exam: VSS, not hypoxic, afebrile General:  Appears calm and comfortable Cardiovascular: RRR, no m/r/g. No LE edema. Respiratory: CTA bilaterally, no w/r/r. Normal respiratory effort. Abdomen: soft, ntnd, positive bowel sounds Psychiatric: grossly normal mood and affect, speech fluent and appropriate  New data reviewed:  BMP unremarkable, Potassium 3.4  INR 2.04  Pertinent data since admission:  Abdominal XR: Bowel gas pattern unremarkable.  Pending data:  Stool cultures  Scheduled Meds: . feeding supplement  1 Container Oral BID BM  . heparin  5,000 Units Subcutaneous 3  times per day  . HYDROcodone-acetaminophen  2 tablet Oral 4 times per day  . letrozole  2.5 mg Oral Daily  . potassium chloride SA  20 mEq Oral BID  . saccharomyces boulardii  250 mg Oral BID  . sodium chloride  3 mL Intravenous Q12H  . vancomycin  125 mg Oral QID  . Warfarin - Pharmacist Dosing Inpatient   Does not apply Q24H    Continuous Infusions: . sodium chloride 50 mL/hr at 01/25/15 1807    Principal Problem:   Diarrhea Active Problems:   GERD   Breast cancer metastasized to bone   Dehydration   Hypokalemia   AKI (acute kidney injury)   Essential hypertension   Abdominal pain   Clostridium difficile diarrhea   Protein-calorie malnutrition, severe   Time spent 20 minutes   By signing my name below, I, Rosalie Doctor attest that this documentation has been prepared under the direction and in the presence of Murray Hodgkins, MD Electronically signed: Rosalie Doctor, Scribe.  01/26/2015  11:19 AM  I personally performed the services described in this documentation. All medical record entries made by the scribe were at my direction. I have reviewed the chart and agree that the record reflects my personal performance and is accurate and complete. Murray Hodgkins, MD

## 2015-01-26 NOTE — Progress Notes (Addendum)
Subjective:  7-8 BMs yesterday but only 2 overnight. States improved. Tolerating diet. Notes that she was not on pantoprazole on regular basis as outpatient. Does not need often. Discussed need to hold right now due to C.diff.   Objective: Vital signs in last 24 hours: Temp:  [98.4 F (36.9 C)-98.7 F (37.1 C)] 98.7 F (37.1 C) (09/29 0549) Pulse Rate:  [55-63] 60 (09/29 0549) Resp:  [18-20] 18 (09/29 0549) BP: (103-121)/(49-67) 119/67 mmHg (09/29 0549) SpO2:  [97 %-100 %] 100 % (09/29 0549) Last BM Date: 01/25/15 General:   Alert,  Well-developed, well-nourished, pleasant and cooperative in NAD Head:  Normocephalic and atraumatic. Eyes:  Sclera clear, no icterus.  Abdomen:  Soft, mild diffuse tenderness and nondistended. Normal bowel sounds, without guarding, and without rebound.   Extremities:  Without clubbing, deformity or edema. Neurologic:  Alert and  oriented x4;  grossly normal neurologically. Skin:  Intact without significant lesions or rashes. Psych:  Alert and cooperative. Normal mood and affect.  Intake/Output from previous day: 09/28 0701 - 09/29 0700 In: 840 [P.O.:840] Out: -  Intake/Output this shift:    Lab Results: CBC  Recent Labs  01/23/15 1722 01/24/15 1921 01/25/15 0638  WBC 2.6* 2.5* 2.3*  HGB 11.4* 11.1* 10.1*  HCT 33.4* 32.1* 29.3*  MCV 102.8* 101.3* 101.4*  PLT 172 172 152   BMET  Recent Labs  01/24/15 1921 01/25/15 0638 01/26/15 0601  NA 143 144 144  K 3.2* 3.6 3.4*  CL 110 114* 116*  CO2 25 25 25   GLUCOSE 100* 100* 97  BUN 12 9 5*  CREATININE 0.63 0.54 0.54  CALCIUM 8.2* 7.4* 7.0*   LFTs  Recent Labs  01/23/15 1722 01/24/15 1921  BILITOT 0.4 0.6  ALKPHOS 49 45  AST 23 20  ALT 12* 12*  PROT 7.0 7.0  ALBUMIN 4.0 3.9    Recent Labs  01/23/15 1722  LIPASE 18*   PT/INR  Recent Labs  01/25/15 1557 01/26/15 0601  LABPROT 23.4* 22.9*  INR 2.10* 2.04*   GI pathogen panel: negative.   Imaging Studies: Dg Abd 1  View  01/24/2015   CLINICAL DATA:  Two week history of lower abdominal pain and diarrhea; nausea  EXAM: ABDOMEN - 1 VIEW  COMPARISON:  None.  FINDINGS: There is no bowel dilatation or air-fluid level suggesting obstruction. No free air. There are small phleboliths in the pelvis.  IMPRESSION: Bowel gas pattern unremarkable.   Electronically Signed   By: Lowella Grip III M.D.   On: 01/24/2015 20:04   Dg Femur Min 2 Views Left  12/30/2014   CLINICAL DATA:  Left femur pain.  History breast cancer.  EXAM: LEFT FEMUR 2 VIEWS  COMPARISON:  None.  FINDINGS: Mild degenerative changes are present in the left knee. Hip and knee are located. There is no significant effusion. No focal lytic or blastic lesions are present.  IMPRESSION: 1. Mild degenerative changes in the left knee. 2. No evidence for metastatic disease.   Electronically Signed   By: San Morelle M.D.   On: 12/30/2014 08:20  [2 weeks]   Assessment: 70 y/o female with history of metastatic breast cancer with several month h/o non-bloody watery diarrhea in setting of pneumonia earlier this year and multiple antibiotic exposure. Her Cdiff antigen is positive but negative toxin however in this setting would recommend treatment. GI pathogen pending. Stool culture pending. Some improvement on oral vanc.   Plan: 1. D/C heparin as patient is on coumadin with  therapeutic INR. Discussed with pharmacy. 2. F/U pending stool culture.  3. Continue vancomycin. 4. Hold PPI in setting of C.diff. 5. Continue Florastor.   Laureen Ochs. Bernarda Caffey [facility] [phone] 9/29/20169:02 AM      LOS: 2 days    Attending note:  Patient seen and examined. She already looks much better than she did when I saw her in the office 2 days ago. Agree with above assessment and recommendations as outlined.

## 2015-01-27 DIAGNOSIS — E860 Dehydration: Secondary | ICD-10-CM

## 2015-01-27 LAB — PROTIME-INR
INR: 1.64 — AB (ref 0.00–1.49)
Prothrombin Time: 19.4 seconds — ABNORMAL HIGH (ref 11.6–15.2)

## 2015-01-27 LAB — STOOL CULTURE

## 2015-01-27 MED ORDER — WARFARIN SODIUM 2 MG PO TABS
4.0000 mg | ORAL_TABLET | Freq: Once | ORAL | Status: AC
  Administered 2015-01-27: 4 mg via ORAL
  Filled 2015-01-27: qty 2

## 2015-01-27 MED ORDER — VANCOMYCIN 50 MG/ML ORAL SOLUTION: 125.0000 mg | Freq: Four times a day (QID) | ORAL | Status: DC

## 2015-01-27 NOTE — Progress Notes (Signed)
PROGRESS NOTE  [name] NFA:[id number] DOB: October 28, 1945 DOA: 01/24/2015 PCP: Curlene Labrum, MD  Summary: Patient is a 69 yo female with history of metastatic breast cancer, GERD, HTN and recent PNA presented with abdominal pain and diarrhea. Patient received multiple rounds of abx since April for cavitary PNA. Of note, patient is currently on chemotherapy for ongoing metastatic disease. While in the ED labs revealed AKI and hypokalemia. Patient was admitted for further evaluation.  Assessment/Plan: 1. Cdiff colitis with abdominal pain and diarrhea, present on admission. Improved but not resolved. Multiple rounds of abx since April for cavitary pneumonia. Currently on oral chemotherapy. GI recommended empiric treatment for C. difficile colitis. 2. Dehydration in the setting of poor oral intake, resolved. Eating well now. Vague early satiety and esophageal dysphagia of uncertain significance. 3. AKI. Resolved, secondary to diarrhea, FTT, poor appetite. Resolved 4. Hypokalemia, resolved 5. Leukopenia, likely secondary to chemotherapy, stable.  6. Anemia of malignancy, stable.  7. 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 Currently on radiation and oral chemotherapy. Per Dr. [name]: The letrozole should be continued but the palbociclib can be held (it may lower counts), to be resumed as outpatient. Has f/u 10/12.  8. HTN. Stable 9. GERD.  10. Severe malnutrition, improved. Patient tolerating normal diet   Overall improving with toleration of diet, decreased diarrhea.  Continue oral vancomycin. Monitor stools   Anticipate discharge next 1-2 days.    Code Status: FULL DVT Prophylaxis: Coumadin Family Communication: Discussed with patient who understands and has no concerns at this time. Disposition Plan: Home 1-2 days.  Murray Hodgkins, MD  [facility]  Pager [phone] If 7PM-7AM, please contact night-coverage at [email or web address], password [facility] 01/27/2015,  8:00 AM  LOS: 3 days   Consultants:  GI  Hematology  Procedures:    Antibiotics:  Oral vancomycin 9/28 >>  HPI/Subjective: Reports headache, abdominal cramping. 4 small bowel movements today and 2 last night, primarily after eating. Tolerating solids.  Objective: Filed Vitals:   01/26/15 0549 01/26/15 1314 01/26/15 2254 01/27/15 0642  BP: 119/67 139/71 152/73 149/66  Pulse: 60 70 60 61  Temp: 98.7 F (37.1 C) 99.3 F (37.4 C) 99.5 F (37.5 C) 99.3 F (37.4 C)  TempSrc: Oral Oral Oral Oral  Resp: 18 18 18 18   Height:      Weight:      SpO2: 100% 100% 97% 100%    Intake/Output Summary (Last 24 hours) at 01/27/15 0800 Last data filed at 01/26/15 1800  Gross per 24 hour  Intake    240 ml  Output      0 ml  Net    240 ml     Filed Weights   01/24/15 1737  Weight: 60.839 kg (134 lb 2 oz)    Exam:  VSS, not hypoxic, afebrile General:  Appears comfortable, calm. Cardiovascular: Regular rate and rhythm, no murmur, rub or gallop. No lower extremity edema. Respiratory: Clear to auscultation bilaterally, no wheezes, rales or rhonchi. Normal respiratory effort. Abdomen: soft, ntnd, positive bowel sounds Psychiatric: grossly normal mood and affect, speech fluent and appropriate  New data reviewed:  INR 1.64   Pertinent data since admission:  Abdominal XR: Bowel gas pattern unremarkable.  Pending data:  Stool cultures  Scheduled Meds: . feeding supplement  1 Container Oral BID BM  . HYDROcodone-acetaminophen  2 tablet Oral 4 times per day  . letrozole  2.5 mg Oral Daily  . potassium chloride SA  20 mEq Oral BID  .  saccharomyces boulardii  250 mg Oral BID  . sodium chloride  3 mL Intravenous Q12H  . vancomycin  125 mg Oral QID  . Warfarin - Pharmacist Dosing Inpatient   Does not apply Q24H   Continuous Infusions:    Principal Problem:   Clostridium difficile diarrhea Active Problems:   GERD   Diarrhea   Breast cancer metastasized to bone    Dehydration   Hypokalemia   AKI (acute kidney injury)   Essential hypertension   Abdominal pain   Protein-calorie malnutrition, severe   Time spent: 20 minutes  By signing my name below, I, Rhett Bannister attest that this documentation has been prepared under the direction and in the presence of Murray Hodgkins, MD   Electronically signed: Rhett Bannister  01/27/2015  12:02 PM  I personally performed the services described in this documentation. All medical record entries made by the scribe were at my direction. I have reviewed the chart and agree that the record reflects my personal performance and is accurate and complete. Murray Hodgkins, MD

## 2015-01-27 NOTE — Care Management Important Message (Signed)
Important Message  Patient Details  Name: RIVEN BEEBE MRN: [id number] Date of Birth: Mar 01, 1946   Medicare Important Message Given:  Yes-second notification given    Joylene Draft, RN 01/27/2015, 10:25 AM

## 2015-01-27 NOTE — Progress Notes (Signed)
ANTICOAGULATION CONSULT NOTE - follow up  Pharmacy Consult for coumadin Indication: extensive superficial phlebitis  Allergies  Allergen Reactions  . Aspirin   . Percodan [Oxycodone-Aspirin] Nausea Only  . Codeine Nausea Only   Patient Measurements: Height: 5\' 4"  (162.6 cm) Weight: 134 lb 2 oz (60.839 kg) IBW/kg (Calculated) : 54.7  Vital Signs: Temp: 99.3 F (37.4 C) (09/30 0642) Temp Source: Oral (09/30 5465) BP: 149/66 mmHg (09/30 0642) Pulse Rate: 61 (09/30 0642)  Labs:  Recent Labs  01/24/15 1921 01/25/15 0354 01/25/15 1557 01/26/15 0601 01/27/15 0635  HGB 11.1* 10.1*  --   --   --   HCT 32.1* 29.3*  --   --   --   PLT 172 152  --   --   --   LABPROT  --   --  23.4* 22.9* 19.4*  INR  --   --  2.10* 2.04* 1.64*  CREATININE 0.63 0.54  --  0.54  --     Estimated Creatinine Clearance: 55.7 mL/min (by C-G formula based on Cr of 0.54).  Medical History: Past Medical History  Diagnosis Date  . Cancer     RBreast, Lower back , Under l Arm  . Hypertension   . GERD (gastroesophageal reflux disease)   . Breast cancer metastasized to bone 04/30/2011  . Radiation 01/15/2012    Lumbosacral spine 3500 cGy 14  fx  . Swollen R ankle 08/10/2012  . Fracture of ankle, medial malleolus, right, closed 08/10/2012  . Tubular adenoma 2011  . Hiatal hernia 2011  . Schatzki's ring   . Anorexia 03/02/2013  . Weight loss due to medication 03/02/2013   Medications:  Prescriptions prior to admission  Medication Sig Dispense Refill Last Dose  . fluticasone (FLONASE) 50 MCG/ACT nasal spray Place 2 sprays into both nostrils daily as needed for allergies or rhinitis.   1 Past Week at Unknown time  . HYDROcodone-acetaminophen (NORCO) 10-325 MG per tablet Take 1-2 tablets by mouth every 4 (four) hours as needed for moderate pain or severe pain. 360 tablet 0 01/24/2015 at 1215  . letrozole (FEMARA) 2.5 MG tablet Take 1 tablet (2.5 mg total) by mouth daily. 90 tablet 4 01/22/2015 at 1400  .  lisinopril (PRINIVIL,ZESTRIL) 20 MG tablet Take 20 mg by mouth daily.   3 01/23/2015 at Unknown time  . mometasone (ELOCON) 0.1 % lotion apply topically daily as needed for itching  5 Past Week at Unknown time  . ondansetron (ZOFRAN-ODT) 8 MG disintegrating tablet DISSOLVE 1 TABLET ON THE TONGUE EVERY 8 HOURS AS NEEDED FOR NAUSEA 20 tablet 0 01/22/2015 at Unknown time  . palbociclib (IBRANCE) 125 MG capsule Take 1 capsule (125 mg total) by mouth daily. Take 21days out of 28 with Letrozole, with food 21 capsule 3 Past Week at Unknown time  . Polyethyl Glycol-Propyl Glycol (SYSTANE OP) Apply 1 drop to eye daily as needed (dry eyes).   Past Month at Unknown time  . Vitamin D, Ergocalciferol, (DRISDOL) 50000 UNITS CAPS capsule Take 50,000 Units by mouth every Thursday.  30 capsule  01/19/2015  . warfarin (COUMADIN) 4 MG tablet 4mg  on Mon.and,Fri., and 2 mg all other days (Patient taking differently: Take 4 mg by mouth daily. ) 90 tablet 4 01/22/2015 at 1400  . potassium chloride SA (K-DUR,KLOR-CON) 20 MEQ tablet Take 1 tablet (20 mEq total) by mouth 2 (two) times daily. 10 tablet 0 unknown    Assessment: 69 yo lady to continue coumadin for extensive superficial phlebitis.  INR today is subtherapeutic.  No bleeding reported.  Goal of Therapy:  INR 2-2.5 Monitor platelets by anticoagulation protocol: Yes   Plan:  Coumadin 4 mg po today Daily PT/INR.   Seay, [place] 01/27/2015,8:36 AM

## 2015-01-28 LAB — BASIC METABOLIC PANEL
Anion gap: 5 (ref 5–15)
BUN: 6 mg/dL (ref 6–20)
CALCIUM: 7.3 mg/dL — AB (ref 8.9–10.3)
CO2: 29 mmol/L (ref 22–32)
CREATININE: 0.53 mg/dL (ref 0.44–1.00)
Chloride: 107 mmol/L (ref 101–111)
GFR calc non Af Amer: >60 mL/min (ref >60)
Glucose, Bld: 98 mg/dL (ref 65–99)
Potassium: 3.4 mmol/L — ABNORMAL LOW (ref 3.5–5.1)
SODIUM: 141 mmol/L (ref 135–145)

## 2015-01-28 LAB — PROTIME-INR
INR: 1.57 — ABNORMAL HIGH (ref 0.00–1.49)
PROTHROMBIN TIME: 18.8 s — AB (ref 11.6–15.2)

## 2015-01-28 MED ORDER — SACCHAROMYCES BOULARDII 250 MG PO CAPS: 250.0000 mg | ORAL_CAPSULE | Freq: Two times a day (BID) | ORAL | Status: DC

## 2015-01-28 MED ORDER — BOOST / RESOURCE BREEZE PO LIQD: 1.0000 | Freq: Two times a day (BID) | ORAL | Status: DC

## 2015-01-28 MED ORDER — WARFARIN SODIUM 4 MG PO TABS
4.0000 mg | ORAL_TABLET | Freq: Every day | ORAL | Status: DC
Start: 2015-01-28 — End: 2015-02-28

## 2015-01-28 MED ORDER — WARFARIN SODIUM 2 MG PO TABS: 4.0000 mg | ORAL_TABLET | Freq: Once | ORAL | Status: DC

## 2015-01-28 NOTE — Progress Notes (Signed)
ANTICOAGULATION CONSULT NOTE - follow up  Pharmacy Consult for coumadin Indication: extensive superficial phlebitis  Allergies  Allergen Reactions  . Aspirin   . Percodan [Oxycodone-Aspirin] Nausea Only  . Codeine Nausea Only   Patient Measurements: Height: 5\' 4"  (162.6 cm) Weight: 134 lb 2 oz (60.839 kg) IBW/kg (Calculated) : 54.7  Vital Signs: Temp: 98.8 F (37.1 C) (10/01 0647) Temp Source: Oral (10/01 0647) BP: 127/60 mmHg (10/01 0647) Pulse Rate: 55 (10/01 0647)  Labs:  Recent Labs  01/26/15 0601 01/27/15 0635 01/28/15 0550  LABPROT 22.9* 19.4* 18.8*  INR 2.04* 1.64* 1.57*  CREATININE 0.54  --  0.53    Estimated Creatinine Clearance: 55.7 mL/min (by C-G formula based on Cr of 0.53).  Medical History: Past Medical History  Diagnosis Date  . Cancer     RBreast, Lower back , Under l Arm  . Hypertension   . GERD (gastroesophageal reflux disease)   . Breast cancer metastasized to bone 04/30/2011  . Radiation 01/15/2012    Lumbosacral spine 3500 cGy 14  fx  . Swollen R ankle 08/10/2012  . Fracture of ankle, medial malleolus, right, closed 08/10/2012  . Tubular adenoma 2011  . Hiatal hernia 2011  . Schatzki's ring   . Anorexia 03/02/2013  . Weight loss due to medication 03/02/2013   Medications:  Prescriptions prior to admission  Medication Sig Dispense Refill Last Dose  . fluticasone (FLONASE) 50 MCG/ACT nasal spray Place 2 sprays into both nostrils daily as needed for allergies or rhinitis.   1 Past Week at Unknown time  . HYDROcodone-acetaminophen (NORCO) 10-325 MG per tablet Take 1-2 tablets by mouth every 4 (four) hours as needed for moderate pain or severe pain. 360 tablet 0 01/24/2015 at 1215  . letrozole (FEMARA) 2.5 MG tablet Take 1 tablet (2.5 mg total) by mouth daily. 90 tablet 4 01/22/2015 at 1400  . lisinopril (PRINIVIL,ZESTRIL) 20 MG tablet Take 20 mg by mouth daily.   3 01/23/2015 at Unknown time  . mometasone (ELOCON) 0.1 % lotion apply topically  daily as needed for itching  5 Past Week at Unknown time  . ondansetron (ZOFRAN-ODT) 8 MG disintegrating tablet DISSOLVE 1 TABLET ON THE TONGUE EVERY 8 HOURS AS NEEDED FOR NAUSEA 20 tablet 0 01/22/2015 at Unknown time  . palbociclib (IBRANCE) 125 MG capsule Take 1 capsule (125 mg total) by mouth daily. Take 21days out of 28 with Letrozole, with food 21 capsule 3 Past Week at Unknown time  . Polyethyl Glycol-Propyl Glycol (SYSTANE OP) Apply 1 drop to eye daily as needed (dry eyes).   Past Month at Unknown time  . Vitamin D, Ergocalciferol, (DRISDOL) 50000 UNITS CAPS capsule Take 50,000 Units by mouth every Thursday.  30 capsule  01/19/2015  . warfarin (COUMADIN) 4 MG tablet 4mg  on Mon.and,Fri., and 2 mg all other days (Patient taking differently: Take 4 mg by mouth daily. ) 90 tablet 4 01/22/2015 at 1400  . potassium chloride SA (K-DUR,KLOR-CON) 20 MEQ tablet Take 1 tablet (20 mEq total) by mouth 2 (two) times daily. 10 tablet 0 unknown    Assessment: 69 yo lady to continue coumadin for extensive superficial phlebitis.  INR today is subtherapeutic.  No bleeding reported.  Goal of Therapy:  INR 2-2.5 Monitor platelets by anticoagulation protocol: Yes   Plan:  Repeat Coumadin 4 mg po today Daily PT/INR.   [name], [name] 01/28/2015,9:26 AM

## 2015-01-28 NOTE — Progress Notes (Signed)
PROGRESS NOTE  [name] CNO:[id number] DOB: Jan 11, 1946 DOA: 01/24/2015 PCP: Curlene Labrum, MD  Summary: Patient is a 69 yo female with history of metastatic breast cancer, GERD, HTN and recent PNA presented with abdominal pain and diarrhea. Patient received multiple rounds of abx since April for cavitary PNA. Of note, patient is currently on chemotherapy for ongoing metastatic disease. While in the ED labs revealed AKI and hypokalemia. Patient was admitted for further evaluation.  Assessment/Plan: 1. Cdiff colitis with abdominal pain and diarrhea, present on admission. Nearly resolved. Multiple rounds of abx since April for cavitary pneumonia. Currently on oral chemotherapy. Stool culture negative.  2. Dehydration in the setting of poor oral intake, resolved. Eating well now.   3. AKI, secondary to diarrhea, FTT, poor appetite. Resolved 4. Hypokalemia, continue repletion. 5. Leukopenia, likely secondary to chemotherapy, stable.  6. Anemia of malignancy, stable.  7. 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 Currently on radiation and oral chemotherapy.  8. HTN. Stable 9. GERD.  10. Severe malnutrition, improved. Patient tolerating normal diet   Overall improved. Tolerating diet.  Minimal stools which are now formed.  Discharge home today, complete vancomycin by mouth. Dr. Marlene Lard office will contact patient to coordinate outpatient follow-up.   Code Status: FULL DVT Prophylaxis: Coumadin Family Communication: Discussed with patient who understands and has no concerns at this time. Disposition Plan: Discharge home today.   Murray Hodgkins, MD  [facility]  Pager [phone] If 7PM-7AM, please contact night-coverage at [email or web address], password [facility] 01/28/2015, 10:24 AM  LOS: 4 days   Consultants:  GI  Procedures:    Antibiotics:  Oral vancomycin 9/28 >>  HPI/Subjective: Feels good. Had some nausea last night but was resolved with Zofran. Ate  dinner last night and breakfast this morning without difficulty. Stool is now more formed. Has had one BM since yesterday afternoon.   Objective: Filed Vitals:   01/27/15 0642 01/27/15 1526 01/27/15 2100 01/28/15 0647  BP: 149/66 114/73 129/71 127/60  Pulse: 61 63 65 55  Temp: 99.3 F (37.4 C) 98.5 F (36.9 C) 98.1 F (36.7 C) 98.8 F (37.1 C)  TempSrc: Oral Oral Axillary Oral  Resp: 18 18  18   Height:      Weight:      SpO2: 100% 97% 99% 97%    Intake/Output Summary (Last 24 hours) at 01/28/15 1024 Last data filed at 01/28/15 0854  Gross per 24 hour  Intake    603 ml  Output      0 ml  Net    603 ml     Filed Weights   01/24/15 1737  Weight: 60.839 kg (134 lb 2 oz)    Exam:  VSS, afebrile, not hypoxic General:  Appears calm and comfortable Cardiovascular: RRR, no m/r/g. No LE edema. Respiratory: CTA bilaterally, no w/r/r. Normal respiratory effort. Abdomen: soft, ntnd, positive bowel sounds Psychiatric: grossly normal mood and affect, speech fluent and appropriate  New data reviewed:  Stool cultures negative.    Potassium 3.4, otherwise BMP unremarkable  INR 1.57  Pertinent data since admission:  Abdominal XR: Bowel gas pattern unremarkable.  Pending data:    Scheduled Meds: . feeding supplement  1 Container Oral BID BM  . HYDROcodone-acetaminophen  2 tablet Oral 4 times per day  . letrozole  2.5 mg Oral Daily  . potassium chloride SA  20 mEq Oral BID  . saccharomyces boulardii  250 mg Oral BID  . sodium chloride  3 mL Intravenous Q12H  .  vancomycin  125 mg Oral QID  . warfarin  4 mg Oral Once  . Warfarin - Pharmacist Dosing Inpatient   Does not apply Q24H   Continuous Infusions:    Principal Problem:   Clostridium difficile diarrhea Active Problems:   GERD   Diarrhea   Breast cancer metastasized to bone   Dehydration   Hypokalemia   AKI (acute kidney injury)   Essential hypertension   Abdominal pain   Protein-calorie malnutrition,  severe    By signing my name below, I, Rosalie Doctor attest that this documentation has been prepared under the direction and in the presence of Murray Hodgkins, MD Electronically signed: Rosalie Doctor, Scribe.  01/28/2015 10:14am  I personally performed the services described in this documentation. All medical record entries made by the scribe were at my direction. I have reviewed the chart and agree that the record reflects my personal performance and is accurate and complete. Murray Hodgkins, MD

## 2015-01-28 NOTE — Progress Notes (Signed)
Removed Pt IV, tolerated well. Reviewed discharge instructions with pt and answered questions at this time.

## 2015-01-28 NOTE — Discharge Summary (Signed)
Physician Discharge Summary  [name] PYP:[id number] DOB: [birth year] DOA: 01/24/2015  PCP: Curlene Labrum, MD  Admit date: 01/24/2015 Discharge date: 01/28/2015  Recommendations for Outpatient Follow-up:  1. Follow up with PCP early next week to follow-up on INR given abx use. 2. Follow up with GI - Dr. [name], for resolution of diarrhea, c.diff.   Follow-up Information    Follow up with Curlene Labrum, MD.   Why:  for INR check, recommend calling office 10/3 for appt 10/3 or 10/4   Contact information:   [name] [address] [phone]       Follow up with Manus Rudd, MD.   Specialty:  Gastroenterology   Why:  office will call you with appointment   Contact information:   [address] [place] [address] [phone]       Discharge Diagnoses:  1. Cdiff colitis with abdominal pain and diarrhea, present on admission.  2. Dehydration in the setting of poor oral intake. 3. AKI, secondary to diarrhea, FTT, poor appetite. 4. Hypokalemia. 5. Leukopenia secondary to chemotherapy. 6. Anemia secondary to malignancy. 7. 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  8. HTN. 9. GERD.  10. Severe malnutrition.  Discharge Condition: Improved Disposition: SNF  Diet recommendation: As tolerated  Filed Weights   01/24/15 1737  Weight: 60.839 kg (134 lb 2 oz)    History of present illness:  Patient is a 69 yo female with history of metastatic breast cancer, GERD, HTN and recent PNA presented with abdominal pain and diarrhea. Patient received multiple rounds of abx since April for cavitary PNA. Of note, patient is currently on chemotherapy for ongoing metastatic disease. While in the ED labs revealed AKI and hypokalemia. Patient was admitted for further evaluation.    Hospital Course:  Seen in consultation gastroenterology, given her ongoing diarrhea, failure to thrive, found weight loss and poor oral intake, it was felt that the patient  had active C. difficile colitis, she was started on empiric vancomycin with rapid improvement with near resolution of diarrhea and improved oral intake. Acute issues resolved at time of discharge she is tolerating a diet. Hospitalization was uncomplicated. She will follow up with gastroenterology as an outpatient.  Individual issues as below:  1. Cdiff colitis with abdominal pain and diarrhea, present on admission. Nearly resolved. Multiple rounds of abx since April for cavitary pneumonia. Currently on oral chemotherapy. Stool culture negative.  2. Dehydration in the setting of poor oral intake, resolved. Eating well now. Vague early satiety and esophageal dysphagia of uncertain significance. 3. AKI, secondary to diarrhea, FTT, poor appetite. Resolved 4. Hypokalemia, resolved 5. Leukopenia, likely secondary to chemotherapy, stable.  6. Anemia of malignancy, stable.  7. 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 Currently on radiation and oral chemotherapy. Per Dr. [name]: The letrozole should be continued but the palbociclib can be held (it may lower counts), to be resumed as outpatient. Has f/u 10/12.  8. HTN. Stable 9. GERD.  10. Severe malnutrition, improved. Patient tolerating normal diet   Consultants:  GI  Procedures:  none  Antibiotics:  Oral vancomycin 9/28 >>   Discharge Instructions Discharge Instructions    Activity as tolerated - No restrictions    Complete by:  As directed      Diet general    Complete by:  As directed      Discharge instructions    Complete by:  As directed   Call your physician or seek immediate  medical attention for diarrhea, inability to eat, weakness, vomiting or worsening of condition.           Current Discharge Medication List    START taking these medications   Details  feeding supplement (BOOST / RESOURCE BREEZE) LIQD Take 1 Container by mouth 2 (two) times daily between meals. Refills: 0    saccharomyces  boulardii (FLORASTOR) 250 MG capsule Take 1 capsule (250 mg total) by mouth 2 (two) times daily at 10 AM and 5 PM. Qty: 60 capsule, Refills: 0    vancomycin (VANCOCIN) 50 mg/mL oral solution Take 2.5 mLs (125 mg total) by mouth 4 (four) times daily. Qty: 120 mL, Refills: 0      CONTINUE these medications which have CHANGED   Details  warfarin (COUMADIN) 4 MG tablet Take 1 tablet (4 mg total) by mouth daily. Qty: 90 tablet, Refills: 4      CONTINUE these medications which have NOT CHANGED   Details  fluticasone (FLONASE) 50 MCG/ACT nasal spray Place 2 sprays into both nostrils daily as needed for allergies or rhinitis.  Refills: 1    HYDROcodone-acetaminophen (NORCO) 10-325 MG per tablet Take 1-2 tablets by mouth every 4 (four) hours as needed for moderate pain or severe pain. Qty: 360 tablet, Refills: 0   Associated Diagnoses: Breast cancer metastasized to bone, unspecified laterality (HCC)    letrozole (FEMARA) 2.5 MG tablet Take 1 tablet (2.5 mg total) by mouth daily. Qty: 90 tablet, Refills: 4   Associated Diagnoses: Breast cancer, unspecified laterality; Secondary malignant neoplasm of bone and bone marrow    lisinopril (PRINIVIL,ZESTRIL) 20 MG tablet Take 20 mg by mouth daily.  Refills: 3    mometasone (ELOCON) 0.1 % lotion apply topically daily as needed for itching Refills: 5    ondansetron (ZOFRAN-ODT) 8 MG disintegrating tablet DISSOLVE 1 TABLET ON THE TONGUE EVERY 8 HOURS AS NEEDED FOR NAUSEA Qty: 20 tablet, Refills: 0    palbociclib (IBRANCE) 125 MG capsule Take 1 capsule (125 mg total) by mouth daily. Take 21days out of 28 with Letrozole, with food Qty: 21 capsule, Refills: 3    Polyethyl Glycol-Propyl Glycol (SYSTANE OP) Apply 1 drop to eye daily as needed (dry eyes).    Vitamin D, Ergocalciferol, (DRISDOL) 50000 UNITS CAPS capsule Take 50,000 Units by mouth every Thursday.  Qty: 30 capsule    potassium chloride SA (K-DUR,KLOR-CON) 20 MEQ tablet Take 1 tablet  (20 mEq total) by mouth 2 (two) times daily. Qty: 10 tablet, Refills: 0       Allergies  Allergen Reactions  . Aspirin   . Percodan [Oxycodone-Aspirin] Nausea Only  . Codeine Nausea Only    The results of significant diagnostics from this hospitalization (including imaging, microbiology, ancillary and laboratory) are listed below for reference.    Significant Diagnostic Studies: Dg Abd 1 View  01/24/2015   CLINICAL DATA:  Two week history of lower abdominal pain and diarrhea; nausea  EXAM: ABDOMEN - 1 VIEW  COMPARISON:  None.  FINDINGS: There is no bowel dilatation or air-fluid level suggesting obstruction. No free air. There are small phleboliths in the pelvis.  IMPRESSION: Bowel gas pattern unremarkable.   Electronically Signed   By: Lowella Grip III M.D.   On: 01/24/2015 20:04   Microbiology: Recent Results (from the past 240 hour(s))  Urine culture     Status: None   Collection Time: 01/23/15  6:30 PM  Result Value Ref Range Status   Specimen Description URINE, CLEAN CATCH  Final   Special Requests NONE  Final   Culture   Final    MULTIPLE SPECIES PRESENT, SUGGEST RECOLLECTION Performed at [facility]    Report Status 01/25/2015 FINAL  Final  Stool culture     Status: None   Collection Time: 01/23/15  6:30 PM  Result Value Ref Range Status   Specimen Description STOOL  Final   Special Requests NONE  Final   Culture   Final    NO SALMONELLA, SHIGELLA, CAMPYLOBACTER, YERSINIA, OR E.COLI 0157:H7 ISOLATED Performed at [facility]    Report Status 01/27/2015 FINAL  Final  C difficile quick scan w PCR reflex     Status: Abnormal   Collection Time: 01/23/15  6:30 PM  Result Value Ref Range Status   C Diff antigen POSITIVE (A) NEGATIVE Final   C Diff toxin NEGATIVE NEGATIVE Final   C Diff interpretation   Final    C. difficile present, but toxin not detected. This indicates colonization. In most cases, this does not require treatment. If patient has  signs and symptoms consistent with colitis, consider treatment.     Labs: Basic Metabolic Panel:  Recent Labs Lab 01/23/15 1722 01/24/15 1921 01/25/15 0638 01/26/15 0601 01/28/15 0550  NA 139 143 144 144 141  K 2.9* 3.2* 3.6 3.4* 3.4*  CL 105 110 114* 116* 107  CO2 25 25 25 25 29   GLUCOSE 115* 100* 100* 97 98  BUN 17 12 9  5* 6  CREATININE 1.20* 0.63 0.54 0.54 0.53  CALCIUM 8.0* 8.2* 7.4* 7.0* 7.3*  MG  --  1.4*  --   --   --    Liver Function Tests:  Recent Labs Lab 01/23/15 1722 01/24/15 1921  AST 23 20  ALT 12* 12*  ALKPHOS 49 45  BILITOT 0.4 0.6  PROT 7.0 7.0  ALBUMIN 4.0 3.9    Recent Labs Lab 01/23/15 1722  LIPASE 18*    CBC:  Recent Labs Lab 01/23/15 1722 01/24/15 1921 01/25/15 0638  WBC 2.6* 2.5* 2.3*  NEUTROABS  --  1.6*  --   HGB 11.4* 11.1* 10.1*  HCT 33.4* 32.1* 29.3*  MCV 102.8* 101.3* 101.4*  PLT 172 172 152    Principal Problem:   Clostridium difficile diarrhea Active Problems:   GERD   Diarrhea   Breast cancer metastasized to bone   Dehydration   Hypokalemia   AKI (acute kidney injury)   Essential hypertension   Abdominal pain   Protein-calorie malnutrition, severe   Time coordinating discharge: 35 minutes  Signed:  Murray Hodgkins, MD [facility] 01/28/2015, 10:23 AM  By signing my name below, I, Rosalie Doctor attest that this documentation has been prepared under the direction and in the presence of Murray Hodgkins, MD Electronically signed: Rosalie Doctor, Scribe.  01/28/2015 10:14am   I personally performed the services described in this documentation. All medical record entries made by the scribe were at my direction. I have reviewed the chart and agree that the record reflects my personal performance and is accurate and complete. Murray Hodgkins, MD

## 2015-01-30 ENCOUNTER — Telehealth: Payer: Self-pay | Admitting: Internal Medicine

## 2015-01-30 NOTE — Telephone Encounter (Signed)
Sometime in the next 2 weeks should be okay as long as diarrhea has gotten better

## 2015-01-30 NOTE — Telephone Encounter (Signed)
Routing to RMR. 

## 2015-01-30 NOTE — Telephone Encounter (Signed)
Pt called this afternoon saying she had been in the hospital for 9 days recently and RMR wanted her to be seen by him within 10 days.  I offered her OV with RMR for tomorrow (Tuesday), but she already had another appointment. I offered her the next available on Oct 25, but she said that was too far out and she said RMR wanted her seen within 10 days. I told her that I would have to get in touch with him to see what he wants to do and we would get back with her. She agreed.  Please advise. [phone]

## 2015-01-30 NOTE — Telephone Encounter (Signed)
Pt is aware of OV on 10/25 at 4pm with RMR

## 2015-02-08 ENCOUNTER — Ambulatory Visit (HOSPITAL_BASED_OUTPATIENT_CLINIC_OR_DEPARTMENT_OTHER): Payer: Medicare PPO | Admitting: Oncology

## 2015-02-08 ENCOUNTER — Other Ambulatory Visit (HOSPITAL_BASED_OUTPATIENT_CLINIC_OR_DEPARTMENT_OTHER): Payer: Medicare PPO

## 2015-02-08 VITALS — BP 135/75 | HR 67 | Temp 99.0°F | Resp 18 | Ht 64.0 in | Wt 139.4 lb

## 2015-02-08 DIAGNOSIS — Z8672 Personal history of thrombophlebitis: Secondary | ICD-10-CM

## 2015-02-08 DIAGNOSIS — C50919 Malignant neoplasm of unspecified site of unspecified female breast: Secondary | ICD-10-CM

## 2015-02-08 DIAGNOSIS — C7951 Secondary malignant neoplasm of bone: Secondary | ICD-10-CM | POA: Diagnosis not present

## 2015-02-08 DIAGNOSIS — Z79811 Long term (current) use of aromatase inhibitors: Secondary | ICD-10-CM

## 2015-02-08 DIAGNOSIS — J984 Other disorders of lung: Secondary | ICD-10-CM

## 2015-02-08 DIAGNOSIS — C773 Secondary and unspecified malignant neoplasm of axilla and upper limb lymph nodes: Secondary | ICD-10-CM | POA: Diagnosis not present

## 2015-02-08 DIAGNOSIS — C50511 Malignant neoplasm of lower-outer quadrant of right female breast: Secondary | ICD-10-CM | POA: Diagnosis not present

## 2015-02-08 DIAGNOSIS — A0472 Enterocolitis due to Clostridium difficile, not specified as recurrent: Secondary | ICD-10-CM

## 2015-02-08 LAB — COMPREHENSIVE METABOLIC PANEL (CC13)
ALBUMIN: 3.6 g/dL (ref 3.5–5.0)
ALK PHOS: 68 U/L (ref 40–150)
ALT: 13 U/L (ref 0–55)
ANION GAP: 7 meq/L (ref 3–11)
AST: 20 U/L (ref 5–34)
BUN: 22.8 mg/dL (ref 7.0–26.0)
CALCIUM: 8.9 mg/dL (ref 8.4–10.4)
CHLORIDE: 108 meq/L (ref 98–109)
CO2: 27 mEq/L (ref 22–29)
Creatinine: 0.9 mg/dL (ref 0.6–1.1)
EGFR: 62 mL/min/{1.73_m2} — AB (ref >90)
Glucose: 89 mg/dl (ref 70–140)
POTASSIUM: 3.9 meq/L (ref 3.5–5.1)
Sodium: 142 mEq/L (ref 136–145)
Total Bilirubin: 0.38 mg/dL (ref 0.20–1.20)
Total Protein: 6.7 g/dL (ref 6.4–8.3)

## 2015-02-08 MED ORDER — HYDROCODONE-ACETAMINOPHEN 10-325 MG PO TABS: 1.0000 | ORAL_TABLET | ORAL | Status: DC | PRN

## 2015-02-08 MED ORDER — ONDANSETRON 8 MG PO TBDP: 8.0000 mg | ORAL_TABLET | Freq: Three times a day (TID) | ORAL | Status: DC | PRN

## 2015-02-08 NOTE — Progress Notes (Signed)
[place]  [phone] [phone]) [phone]     ID: [name] OB: 11-Oct-1945  MR#: [id number]  MIW#:[id number]  PCP: Carolyn Labrum, MD GYN:  [name]  SU:  OTHER MD: [name], [name]  CHIEF COMPLAINT: stage IV breast cancer  CURRENT TREATMENT: letrozole + palbocilcib [name]); denosumab  BREAST CANCER HISTORY: From [name] summary 07/14/2013::  "Complex 69 year old woman with history of ER-positive and ER negative metachronous primary bilateral breast cancers.  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 Progression in bone while on tamoxifen September 2011. Initial complete response to monthly Faslodex injections.She developed a malignant left axillary lymph node with no clear primary in the left breast November 2011. This tumor was ER negative. HER-2 negative. While under evaluation for this lesion she developed a new lesion in the right breast which was ER positive. She underwent bilateral mastectomies 06/28/2010. She was continued on hormonal therapy with monthly Faslodex injections in view of the ER positive component of her tumor with a complete response to this hormone in bone. She underwent radiation to the left chest and axilla subsequent to the mastectomies. She developed a cutaneous recurrence on the right chest wall along the medial aspect of the right mastectomy scar, biopsy proven, initially detected on clinical exam by her radiation oncologist on 02/14/2011. She was treated with electron beam radiation.. 3000 cGy in 15 fractions through December 2012. Further progression in bone in August 2013. Palliative radiation to lumbosacral spine September through October 2013. Brief trial of Xeloda November 2013 through January 2014.  Change to tamoxifen plus [employer] February 2014 stopped for progression and 25 pound weight loss November 2014. Trial of Megace 80 mg twice a day with improved appetite but progressive rise in tumor marker and progressive low back pain with further progression on MRI of the spine done January 2015. Radiation to the lumbar spine February 2015. She started most recent salvage regimen with a combination of Ibrance plus Femara on 07/08/2013. It is too early to assess response."  The patient's breast cancer history is summarized in detailed below   INTERVAL HISTORY: [name] returns today for followup of her stage IV breast cancer Accompanied by a friend. Since her last visit here she was admitted to [name] with severe dehydration, hypokalemia, and diarrhea. She was C. Difficile antigen positive but toxin negative, suggesting colonization , but given the clinical picture she was appropriately treated with oral vancomycin 4 times a day starting September 27, last dose yesterday. [name] in fact her diarrhea has greatly improved. She had 4 non-diarrheal bowel movements yesterday    her  Letrozole and Ibrance were briefly interrupted , but she resumed her [name] October 2. She continues to tolerate that well , with no unusual fatigue and no significant drop in her neutrophil count. She is also on letrozole , with good tolerance. ,   REVIEW OF SYSTEMS: [name] missed work for about 2 weeks but went back yesterday. She is a bit fatigued but " I can do it". She continues to have significant bone pain and she takes hydrocodone one or 2 tablets every 4 hours or so. She complains of chills, night sweats, and insomnia. She has lost quite a bit of weight, down 232 late September, but is now back 239 and hopefully will continue to K weight. She complains  of blurred vision, ringing in her ears, runny nose, palpitations, ankle swelling and his sensation of discomfort in the lower sternal area. She short of breath  when walking up stairs. She has a poor appetite and nothing taste good. She would prefer something salty to something sweet. She still has abdominal cramps on and off. Aside from the bone pain she has back and joint pain which is also chronic and she feels weak and forgetful and she has some numbness problems. She denies fever, bleeding, or rash. That been no unusual headaches, visual changes. A detailed review of systems was otherwise stable   PAST MEDICAL HISTORY: Past Medical History  Diagnosis Date  . Cancer     RBreast, Lower back , Under l Arm  . Hypertension   . GERD (gastroesophageal reflux disease)   . Breast cancer metastasized to bone 04/30/2011  . Radiation 01/15/2012    Lumbosacral spine 3500 cGy 14  fx  . Swollen Carolyn ankle 08/10/2012  . Fracture of ankle, medial malleolus, right, closed 08/10/2012  . Tubular adenoma 2011  . Hiatal hernia 2011  . Schatzki's ring   . Anorexia 03/02/2013  . Weight loss due to medication 03/02/2013    PAST SURGICAL HISTORY: Past Surgical History  Procedure Laterality Date  . Gallbladder surgery  1991  . Mastectomy  06/28/10    bilateral- [name], [name]  . Inguinal hernia repair  90's    right  . Parathyroidectomy  90's  . Carpal tunnel release  90's  . Oophorectomy  90's    right  . Lymph node dissection    . Breast surgery    . Cholecystectomy    . Colonoscopy  01/08/10    Dr. Gala [name]- anal tag,hemorrhoid o/w normal rectum, pancolonic diverticula, diminutive polyp in the base of the cecum= tubular adenoma on bx. poor prep  . Esophagogastroduodenoscopy  01/08/10    Dr. Gala [name]- normal esophagus, small hiatal hernia, antum and body erosions, pedunculated polyp between D1 and D2, duodenal diverticulum, lymphangiectasia, second portion of the duodenum.stomach bx= inflammation, duodenum bx= adenoma  . Rotator cuff repair  08/27/11    Dr. Alphonzo [name]- [name]  . Esophagogastroduodenoscopy (egd) with propofol N/A 11/19/2012    RMR:  non-critical Schatzki's ring s/p dilation with 80 F, multiple gastric polyps, duodenal polyp s/p piecemeal snare polypectomy, gastric polypectomy, path benign  . Maloney dilation N/A 11/19/2012    Procedure: Venia Minks DILATION;  Surgeon: Daneil Dolin, MD;  Location: [facility];  Service: Endoscopy;  Laterality: N/A;  #[id number]  . Polypectomy N/A 11/19/2012    Procedure: POLYPECTOMY;  Surgeon: Daneil Dolin, MD;  Location: [facility];  Service: Endoscopy;  Laterality: N/A;  duodenal  . Esophageal biopsy N/A 11/19/2012    Procedure: BIOPSY;  Surgeon: Daneil Dolin, MD;  Location: [facility];  Service: Endoscopy;  Laterality: N/A;  esophageal    FAMILY HISTORY Family History  Problem Relation Age of Onset  . Cancer Mother     Colon  . Cancer Father     Lung, Mouth,leg   the patient's father died at the age of 56 from metastatic head and neck carcinoma. The patient's mother died of breast cancer the age of 78. The patient had 2 brothers, both with prostate cancer. She has 4 sisters, one diagnosed with breast cancer at the age of 24, another 1 recent underwent bilateral mastectomies. Another one died from colon cancer.   GYNECOLOGIC HISTORY:   menarche age 39, she is GX P0. She  underwent menopause at age 14, status post HRT for 12 years. She status post right salpingo-oophorectomy.   SOCIAL HISTORY:  She works at [employer] in [place]. Her husband died at [facility] [place] from complications of surgery after a 7-1/2 month hospitalization) 2008). She lives by herself, with no pets. She tells me she works 9 hours a day 5 days a week.     ADVANCED DIRECTIVES: In place   HEALTH MAINTENANCE: Social History  Substance Use Topics  . Smoking status: Former Smoker    Quit date: 11/22/1982  . Smokeless tobacco: Never Used     Comment: FQuit smoking x 32 years  . Alcohol Use: No     Colonoscopy:  PAP:  Bone density:  Lipid panel:  Allergies  Allergen Reactions  . Aspirin   . Percodan  [Oxycodone-Aspirin] Nausea Only  . Codeine Nausea Only    Current Outpatient Prescriptions  Medication Sig Dispense Refill  . feeding supplement (BOOST / RESOURCE BREEZE) LIQD Take 1 Container by mouth 2 (two) times daily between meals.  0  . fluticasone (FLONASE) 50 MCG/ACT nasal spray Place 2 sprays into both nostrils daily as needed for allergies or rhinitis.   1  . HYDROcodone-acetaminophen (NORCO) 10-325 MG per tablet Take 1-2 tablets by mouth every 4 (four) hours as needed for moderate pain or severe pain. 360 tablet 0  . letrozole (FEMARA) 2.5 MG tablet Take 1 tablet (2.5 mg total) by mouth daily. 90 tablet 4  . lisinopril (PRINIVIL,ZESTRIL) 20 MG tablet Take 20 mg by mouth daily.   3  . mometasone (ELOCON) 0.1 % lotion apply topically daily as needed for itching  5  . ondansetron (ZOFRAN-ODT) 8 MG disintegrating tablet DISSOLVE 1 TABLET ON THE TONGUE EVERY 8 HOURS AS NEEDED FOR NAUSEA 20 tablet 0  . palbociclib (IBRANCE) 125 MG capsule Take 1 capsule (125 mg total) by mouth daily. Take 21days out of 28 with Letrozole, with food 21 capsule 3  . Polyethyl Glycol-Propyl Glycol (SYSTANE OP) Apply 1 drop to eye daily as needed (dry eyes).    . potassium chloride SA (K-DUR,KLOR-CON) 20 MEQ tablet Take 1 tablet (20 mEq total) by mouth 2 (two) times daily. 10 tablet 0  . saccharomyces boulardii (FLORASTOR) 250 MG capsule Take 1 capsule (250 mg total) by mouth 2 (two) times daily at 10 AM and 5 PM. 60 capsule 0  . vancomycin (VANCOCIN) 50 mg/mL oral solution Take 2.5 mLs (125 mg total) by mouth 4 (four) times daily. 120 mL 0  . Vitamin D, Ergocalciferol, (DRISDOL) 50000 UNITS CAPS capsule Take 50,000 Units by mouth every Thursday.  30 capsule   . warfarin (COUMADIN) 4 MG tablet Take 1 tablet (4 mg total) by mouth daily. 90 tablet 4   No current facility-administered medications for this visit.    OBJECTIVE: Middle-aged white woman who appears stated age 59 Vitals:   02/08/15 1534  BP:  135/75  Pulse: 67  Temp: 99 F (37.2 C)  Resp: 18     Body mass index is 23.92 kg/(m^2).    ECOG FS:1 - Symptomatic but completely ambulatory Filed Vitals:   02/08/15 1534  BP: 135/75  Pulse: 67  Temp: 99 F (37.2 C)  Resp: 18   Pulse OX at rest = 95%  Sclerae unicteric, EOMs intact Oropharynx clear and moist No cervical or supraclavicular adenopathy Lungs no rales or rhonchi Heart regular rate and rhythm Abd soft, nontender, positive bowel sounds, no masses palpated MSK no  focal spinal tenderness, no upper extremity lymphedema; palpation of the sternum shows no tenderness Neuro: nonfocal, well oriented, appropriate affect Breasts: Status post bilateral mastectomies. There is no evidence of chest wall recurrence. Both axillae are benign.   LAB RESULTS: Outside labs reviewed.No components found for: LABCA125 CBC    Component Value Date/Time   WBC 2.3* 01/25/2015 0638   WBC 2.4* 09/27/2014 1108   RBC 2.89* 01/25/2015 0638   RBC 3.29* 09/27/2014 1108   HGB 10.1* 01/25/2015 0638   HGB 10.8* 09/27/2014 1108   HCT 29.3* 01/25/2015 0638   HCT 32.6* 09/27/2014 1108   PLT 152 01/25/2015 0638   PLT 105* 09/27/2014 1108   MCV 101.4* 01/25/2015 0638   MCV 99.1 09/27/2014 1108   MCH 34.9* 01/25/2015 0638   MCH 32.8 09/27/2014 1108   MCHC 34.5 01/25/2015 0638   MCHC 33.1 09/27/2014 1108   RDW 15.5 01/25/2015 0638   RDW 15.6* 09/27/2014 1108   LYMPHSABS 0.2* 01/24/2015 1921   LYMPHSABS 0.4* 09/27/2014 1108   MONOABS 0.5 01/24/2015 1921   MONOABS 0.2 09/27/2014 1108   EOSABS 0.0 01/24/2015 1921   EOSABS 0.0 09/27/2014 1108   BASOSABS 0.1 01/24/2015 1921   BASOSABS 0.1 09/27/2014 1108     No results for input(s): INR in the last 168 hours.  Urinalysis    Component Value Date/Time   COLORURINE YELLOW 01/23/2015 1830   APPEARANCEUR CLEAR 01/23/2015 1830   LABSPEC 1.010 01/23/2015 1830   PHURINE 6.0 01/23/2015 1830   GLUCOSEU NEGATIVE 01/23/2015 1830   HGBUR LARGE*  01/23/2015 1830   BILIRUBINUR NEGATIVE 01/23/2015 [place] 01/23/2015 1830   PROTEINUR TRACE* 01/23/2015 1830   UROBILINOGEN 0.2 01/23/2015 1830   NITRITE NEGATIVE 01/23/2015 1830   LEUKOCYTESUR NEGATIVE 01/23/2015 1830    STUDIES: Dg Abd 1 View  01/24/2015  CLINICAL DATA:  Two week history of lower abdominal pain and diarrhea; nausea EXAM: ABDOMEN - 1 VIEW COMPARISON:  None. FINDINGS: There is no bowel dilatation or air-fluid level suggesting obstruction. No free air. There are small phleboliths in the pelvis. IMPRESSION: Bowel gas pattern unremarkable. Electronically Signed   By: Lowella Grip III M.D.   On: 01/24/2015 20:04    ASSESSMENT: 69 y.o. [place], [place] woman status post right lumpectomy and axillary lymph node dissection December 2006 for a TX N2, stage IIIA invasive ductal carcinoma, estrogen and progesterone receptor positive, with subsequent stage IV disease  (1) received adjuvant doxorubicin, docetaxel and cyclophosphamide  (2) status post adjuvant radiation  (3) on tamoxifen to September 2011 when she was noted to have metastatic disease to bone  (4) fulvestrant 02/06/2009 to 01/15/2011  (5) left axillary lymph node positive for an invasive ductal carcinoma, estrogen receptor and HER-2 negative November 2011, and a right breast recurrence for an estrogen receptor positive lesion at the same time.  (6) status post bilateral mastectomies 06/28/2010  (7) status post left chest and axillary radiation  (8) status post right chest wall recurrence October of 2012, treated with electron beam radiation completed December 2012  (9) progression in bone August 2013,  (10) status post radiation to the lumbosacral spine completed October 2013   (11) capecitabine November 2013 through January 2014  (12) tamoxifen/ everolimus February 2014, discontinued November 2014 with progression  (13) Megace 80 mg twice daily with progression January 2015  (14)  Radiation to lumbar spine February 2015  (15) letrozole plus Ibrance started 07/08/2013  (16). Declines genetic testing (as of 08/17/2013)  (17)  zolendronate 05/01/2009, discontinued because of pain; Xgeva started 10/08/2013, discontinued because of cost issues as of November 2015, switched to alendronate March 2016, switched back to [place] in May 2016, given every 2 or 3 months depending on the patient's visit frequency  OTHER ACTIVE PROBLEMS:  (1) chronic GI complaints, followed by Dr. Sydell Axon  (a) treated for c difficile colitis Sept 2016 (stool Ag positive, toxin negative 01/23/2015), received oral vancomycin started 01/24/2015, stopped 02/07/2015  (2) multiple orthopedic problems, including bilateral chronic foot pain, history of rotator cuff repair  (3) history of extensive superficial phlebitis, on chronic low dose Coumadin.  (4) LUL cavitary pneumonia diagnosed as MRSA while at [facility] April 2016, treated with Zosyn x 21 days  PLAN: Hanny is recovering from her C. difficile colitis. She is aware that this may recur and she will call us if she develops more severe diarrhea. However she tells me she has not had a stable bowel pattern for several months now. She will have several bowel movements in a day and then no bowel movements for 2 or 3 days. At present however there is no diarrhea.  I think the substernal discomfort she is experiencing is going to be due to her having stopped the Protonix (it was appropriately discontinued during the C. difficile treatment). I suggested she started again. Nevertheless I put her in for a chest x-ray today.  She gets her INR checked regularly through Dr. Lizbeth Bark office and also sees Dr. Sydell Axon on a regular basis. We are checking her neutrophil count on a monthly basis and so long as a INR status above 1.5 no changes in her Ibrance dose will need to be made.  Today I refilled her medications and specifically wrote her for 380  hydrocodone/Tylenol, which is about what she takes every month. I wrote her to additional identical prescriptions with the same date so she does not have to come here to pick up her prescriptions or weight on the mail for them to get to her. She will let me know if this new system does not work  Otherwise she is going to see me again in January. Before that visit we will repeat a bone scan and chest x-ray. She knows to call for any problems that may develop before that visit. Chauncey Cruel, MD   02/08/2015 3:37 PM

## 2015-02-09 ENCOUNTER — Other Ambulatory Visit: Payer: Self-pay | Admitting: Oncology

## 2015-02-09 ENCOUNTER — Telehealth: Payer: Self-pay | Admitting: Oncology

## 2015-02-09 DIAGNOSIS — C50011 Malignant neoplasm of nipple and areola, right female breast: Secondary | ICD-10-CM

## 2015-02-09 NOTE — Telephone Encounter (Signed)
Appointments made and avs mailed to patient °

## 2015-02-16 ENCOUNTER — Telehealth: Payer: Self-pay | Admitting: Internal Medicine

## 2015-02-16 ENCOUNTER — Emergency Department (HOSPITAL_COMMUNITY)
Admission: EM | Admit: 2015-02-16 | Discharge: 2015-02-16 | Disposition: A | Discharge status: Home / Self Care | Payer: Medicare PPO | Attending: Emergency Medicine | Admitting: Emergency Medicine

## 2015-02-16 ENCOUNTER — Encounter (HOSPITAL_COMMUNITY): Payer: Self-pay | Admitting: Emergency Medicine

## 2015-02-16 DIAGNOSIS — I10 Essential (primary) hypertension: Secondary | ICD-10-CM | POA: Diagnosis not present

## 2015-02-16 DIAGNOSIS — R1110 Vomiting, unspecified: Secondary | ICD-10-CM | POA: Insufficient documentation

## 2015-02-16 DIAGNOSIS — E876 Hypokalemia: Secondary | ICD-10-CM

## 2015-02-16 DIAGNOSIS — R197 Diarrhea, unspecified: Secondary | ICD-10-CM | POA: Insufficient documentation

## 2015-02-16 DIAGNOSIS — Z9889 Other specified postprocedural states: Secondary | ICD-10-CM | POA: Insufficient documentation

## 2015-02-16 DIAGNOSIS — R1084 Generalized abdominal pain: Secondary | ICD-10-CM | POA: Insufficient documentation

## 2015-02-16 DIAGNOSIS — Z7901 Long term (current) use of anticoagulants: Secondary | ICD-10-CM | POA: Insufficient documentation

## 2015-02-16 DIAGNOSIS — Z79899 Other long term (current) drug therapy: Secondary | ICD-10-CM | POA: Insufficient documentation

## 2015-02-16 DIAGNOSIS — Z8781 Personal history of (healed) traumatic fracture: Secondary | ICD-10-CM | POA: Diagnosis not present

## 2015-02-16 DIAGNOSIS — K219 Gastro-esophageal reflux disease without esophagitis: Secondary | ICD-10-CM | POA: Insufficient documentation

## 2015-02-16 DIAGNOSIS — Z853 Personal history of malignant neoplasm of breast: Secondary | ICD-10-CM | POA: Diagnosis not present

## 2015-02-16 DIAGNOSIS — Z86018 Personal history of other benign neoplasm: Secondary | ICD-10-CM | POA: Diagnosis not present

## 2015-02-16 DIAGNOSIS — Z87738 Personal history of other specified (corrected) congenital malformations of digestive system: Secondary | ICD-10-CM | POA: Insufficient documentation

## 2015-02-16 DIAGNOSIS — E860 Dehydration: Secondary | ICD-10-CM | POA: Diagnosis not present

## 2015-02-16 DIAGNOSIS — Z87891 Personal history of nicotine dependence: Secondary | ICD-10-CM | POA: Diagnosis not present

## 2015-02-16 LAB — C DIFFICILE QUICK SCREEN W PCR REFLEX
C DIFFICILE (CDIFF) INTERP: NEGATIVE
C Diff antigen: NEGATIVE
C Diff toxin: NEGATIVE

## 2015-02-16 LAB — CBC WITH DIFFERENTIAL/PLATELET
BASOS ABS: 0 10*3/uL (ref 0.0–0.1)
Basophils Relative: 1 %
Eosinophils Absolute: 0.1 10*3/uL (ref 0.0–0.7)
Eosinophils Relative: 3 %
HEMATOCRIT: 31.2 % — AB (ref 36.0–46.0)
HEMOGLOBIN: 10.5 g/dL — AB (ref 12.0–15.0)
LYMPHS PCT: 11 %
Lymphs Abs: 0.3 10*3/uL — ABNORMAL LOW (ref 0.7–4.0)
MCH: 34.9 pg — ABNORMAL HIGH (ref 26.0–34.0)
MCHC: 33.7 g/dL (ref 30.0–36.0)
MCV: 103.7 fL — AB (ref 78.0–100.0)
MONO ABS: 0.2 10*3/uL (ref 0.1–1.0)
Monocytes Relative: 8 %
NEUTROS ABS: 2 10*3/uL (ref 1.7–7.7)
Neutrophils Relative %: 77 %
Platelets: 139 10*3/uL — ABNORMAL LOW (ref 150–400)
RBC: 3.01 MIL/uL — AB (ref 3.87–5.11)
RDW: 14.9 % (ref 11.5–15.5)
WBC: 2.6 10*3/uL — AB (ref 4.0–10.5)

## 2015-02-16 LAB — BASIC METABOLIC PANEL
ANION GAP: 7 (ref 5–15)
BUN: 15 mg/dL (ref 6–20)
CO2: 26 mmol/L (ref 22–32)
Calcium: 8.2 mg/dL — ABNORMAL LOW (ref 8.9–10.3)
Chloride: 108 mmol/L (ref 101–111)
Creatinine, Ser: 0.86 mg/dL (ref 0.44–1.00)
GFR calc Af Amer: >60 mL/min (ref >60)
GLUCOSE: 95 mg/dL (ref 65–99)
POTASSIUM: 3.2 mmol/L — AB (ref 3.5–5.1)
Sodium: 141 mmol/L (ref 135–145)

## 2015-02-16 MED ORDER — VANCOMYCIN HCL 125 MG PO CAPS: 125.0000 mg | ORAL_CAPSULE | Freq: Four times a day (QID) | ORAL | Status: DC

## 2015-02-16 MED ORDER — MORPHINE SULFATE (PF) 4 MG/ML IV SOLN
4.0000 mg | Freq: Once | INTRAVENOUS | Status: AC
  Administered 2015-02-16: 4 mg via INTRAVENOUS
  Filled 2015-02-16: qty 1

## 2015-02-16 MED ORDER — POTASSIUM CHLORIDE CRYS ER 20 MEQ PO TBCR
40.0000 meq | EXTENDED_RELEASE_TABLET | Freq: Once | ORAL | Status: AC
  Administered 2015-02-16: 40 meq via ORAL
  Filled 2015-02-16: qty 2

## 2015-02-16 MED ORDER — POTASSIUM CHLORIDE 10 MEQ/100ML IV SOLN
10.0000 meq | Freq: Once | INTRAVENOUS | Status: AC
  Administered 2015-02-16: 10 meq via INTRAVENOUS
  Filled 2015-02-16: qty 100

## 2015-02-16 MED ORDER — ONDANSETRON HCL 4 MG/2ML IJ SOLN
4.0000 mg | Freq: Once | INTRAMUSCULAR | Status: AC
  Administered 2015-02-16: 4 mg via INTRAVENOUS
  Filled 2015-02-16: qty 2

## 2015-02-16 MED ORDER — SODIUM CHLORIDE 0.9 % IV BOLUS (SEPSIS)
1000.0000 mL | Freq: Once | INTRAVENOUS | Status: AC
  Administered 2015-02-16: 1000 mL via INTRAVENOUS

## 2015-02-16 NOTE — Telephone Encounter (Signed)
Pt called at 1207pm saying that she was in the hospital recently and RMR is taking care of her and has upcoming OV on 10/25. Today she said that she is having similar problems of Cdiff and didn't know what to do. I told her that I would see if the nurse was available and she said she was at work and I needed to hurry. JL is aware

## 2015-02-16 NOTE — Telephone Encounter (Signed)
She needs to go back on vancomycin 125 mg orally 4 times a day -start now . She needs this therapy for 2 weeks and then I'll decide on a taper. She needs to be seen in the office week after next before she runs out of this vancomycin treatment. I will customize a tapering regimen at that time.

## 2015-02-16 NOTE — Telephone Encounter (Signed)
I called the pts home number- NA, cell number- NA and work number, but she has already left.

## 2015-02-16 NOTE — ED Notes (Signed)
Per Pt's request, vancomycin prescription called in to [facility] as written.

## 2015-02-16 NOTE — Telephone Encounter (Signed)
PATIENT HAS APPT ON 10/25 WITH RMR

## 2015-02-16 NOTE — ED Notes (Signed)
Discharge instructions given to pt - Pt informed that prescription was called\transfered   through to [facility] as per her prefererance ( not [facility]) . Pt stated she will fu with her gastroenterologist as already scheduled . Ambulated off unit with friends

## 2015-02-16 NOTE — Telephone Encounter (Signed)
Pt is currently in the ED.

## 2015-02-16 NOTE — Telephone Encounter (Signed)
[name] spoke with the pt- she is having a lot of diarrhea. Stool is running down her leg. She is having nausea and vomiting and abd pain. She has been doing this for 2 days. We advised the pt that with her medical history and her recent admission that she needed to go to the ED. Pt said she was an hour away and she was at work and she couldn't go right now and hung up on [place].

## 2015-02-16 NOTE — Telephone Encounter (Signed)
Per RMR- send rx to the pts pharmacy. Ginger called ED and they asked pt which one did she want it to go to. She chose [name]. rx has been sent.  Pt needs ov with RMR

## 2015-02-16 NOTE — Telephone Encounter (Signed)
I have called the pts home number 2x, her cell number 2x and her work number 3x. No answer, no voicemail. I also need to know which pharmacy she uses because she has 3 listed in epic.

## 2015-02-16 NOTE — ED Notes (Signed)
Pt recently hospitalized and treated for c-diff. Presents today with diarrhea and emesis since Tues.

## 2015-02-16 NOTE — Addendum Note (Signed)
Addended by: Claudina Lick on: 02/16/2015 03:54 PM   Modules accepted: Orders

## 2015-02-16 NOTE — ED Provider Notes (Signed)
CSN: [id number]     Arrival date & time 02/16/15  1250 History   First MD Initiated Contact with Patient 02/16/15 1351     Chief Complaint  Patient presents with  . Diarrhea    Patient is a 69 y.o. female presenting with diarrhea. The history is provided by the patient.  Diarrhea Quality:  Watery Severity:  Moderate Onset quality:  Gradual Number of episodes:  Multiple Duration:  2 days Timing:  Intermittent Progression:  Worsening Relieved by:  Nothing Worsened by:  Nothing tried Associated symptoms: abdominal pain, chills and vomiting   Associated symptoms: no fever   Risk factors comment:  Previous h/o c-dif pt reports multiple episodes of diarrhea for past 2 days She did have some vomiting but this has improved No blood in vomit/stool No fever She reports abdominal cramping   Past Medical History  Diagnosis Date  . Cancer (HCC)     RBreast, Lower back , Under l Arm  . Hypertension   . GERD (gastroesophageal reflux disease)   . Breast cancer metastasized to bone ([place]) 04/30/2011  . Radiation 01/15/2012    Lumbosacral spine 3500 cGy 14  fx  . Swollen R ankle 08/10/2012  . Fracture of ankle, medial malleolus, right, closed 08/10/2012  . Tubular adenoma 2011  . Hiatal hernia 2011  . Schatzki's ring   . Anorexia 03/02/2013  . Weight loss due to medication 03/02/2013   Past Surgical History  Procedure Laterality Date  . Gallbladder surgery  1991  . Mastectomy  06/28/10    bilateral- Dr. Rosebud Poles, [place]  . Inguinal hernia repair  90's    right  . Parathyroidectomy  90's  . Carpal tunnel release  90's  . Oophorectomy  90's    right  . Lymph node dissection    . Breast surgery    . Cholecystectomy    . Colonoscopy  01/08/10    Dr. [name]- anal tag,hemorrhoid o/w normal rectum, pancolonic diverticula, diminutive polyp in the base of the cecum= tubular adenoma on bx. poor prep  . Esophagogastroduodenoscopy  01/08/10    Dr. [name]- normal esophagus, small hiatal  hernia, antum and body erosions, pedunculated polyp between D1 and D2, duodenal diverticulum, lymphangiectasia, second portion of the duodenum.stomach bx= inflammation, duodenum bx= adenoma  . Rotator cuff repair  08/27/11    Dr. Alphonzo Cruise- [facility]- [facility]  . Esophagogastroduodenoscopy (egd) with propofol N/A 11/19/2012    RMR: non-critical Schatzki's ring s/p dilation with 50 F, multiple gastric polyps, duodenal polyp s/p piecemeal snare polypectomy, gastric polypectomy, path benign  . Maloney dilation N/A 11/19/2012    Procedure: Venia Minks DILATION;  Surgeon: Daneil Dolin, MD;  Location: [facility];  Service: Endoscopy;  Laterality: N/A;  #54  . Polypectomy N/A 11/19/2012    Procedure: POLYPECTOMY;  Surgeon: Daneil Dolin, MD;  Location: [facility];  Service: Endoscopy;  Laterality: N/A;  duodenal  . Esophageal biopsy N/A 11/19/2012    Procedure: BIOPSY;  Surgeon: Daneil Dolin, MD;  Location: [facility];  Service: Endoscopy;  Laterality: N/A;  esophageal   Family History  Problem Relation Age of Onset  . Cancer Mother     Colon  . Cancer Father     Lung, Mouth,leg   Social History  Substance Use Topics  . Smoking status: Former Smoker    Quit date: 11/22/1982  . Smokeless tobacco: Never Used     Comment: FQuit smoking x 32 years  . Alcohol Use: No   OB History  No data available     Review of Systems  Constitutional: Positive for chills. Negative for fever.  Gastrointestinal: Positive for vomiting, abdominal pain and diarrhea.  All other systems reviewed and are negative.     Allergies  Aspirin; Percodan; and Codeine  Home Medications   Prior to Admission medications   Medication Sig Start Date End Date Taking? Authorizing Provider  acidophilus (RISAQUAD) CAPS capsule Take 1 capsule by mouth daily.   Yes Historical Provider, MD  feeding supplement (BOOST / RESOURCE BREEZE) LIQD Take 1 Container by mouth 2 (two) times daily between meals. 01/28/15  Yes Samuella Cota, MD  fluticasone  [facility]) 50 MCG/ACT nasal spray Place 2 sprays into both nostrils daily as needed for allergies or rhinitis.  12/08/14  Yes Historical Provider, MD  HYDROcodone-acetaminophen (NORCO) 10-325 MG tablet Take 1-2 tablets by mouth every 4 (four) hours as needed for moderate pain or severe pain. 02/08/15  Yes Chauncey Cruel, MD  letrozole [facility]) 2.5 MG tablet Take 1 tablet (2.5 mg total) by mouth daily. 08/18/14  Yes Chauncey Cruel, MD  lisinopril (PRINIVIL,ZESTRIL) 20 MG tablet Take 10 mg by mouth daily. 06/20/14  Yes Historical Provider, MD  mometasone (ELOCON) 0.1 % lotion apply topically daily as needed for itching 10/17/14  Yes Historical Provider, MD  ondansetron (ZOFRAN-ODT) 8 MG disintegrating tablet DISSOLVE 1 TABLET ON THE TONGUE EVERY 8 HOURS AS NEEDED FOR NAUSEA 02/09/15  Yes Chauncey Cruel, MD  palbociclib [facility]) 125 MG capsule Take 1 capsule (125 mg total) by mouth daily. Take 21days out of 28 with Letrozole, with food 12/23/14  Yes Chauncey Cruel, MD  pantoprazole (PROTONIX) 40 MG tablet Take 40 mg by mouth daily.   Yes Historical Provider, MD  Polyethyl Glycol-Propyl Glycol (SYSTANE OP) Apply 1 drop to eye daily as needed (dry eyes).   Yes Historical Provider, MD  warfarin (COUMADIN) 4 MG tablet Take 1 tablet (4 mg total) by mouth daily. 01/28/15  Yes Samuella Cota, MD   BP 119/53 mmHg  Pulse 75  Temp(Src) 98.2 F (36.8 C) (Oral)  Resp 16  Ht 5\' 2"  (1.575 m)  Wt 130 lb (58.968 kg)  BMI 23.77 kg/m2  SpO2 99% Physical Exam CONSTITUTIONAL: Well developed/well nourished HEAD: Normocephalic/atraumatic EYES: EOMI/PERRL ENMT: Mucous membranes dry NECK: supple no meningeal signs SPINE/BACK:entire spine nontender CV: S1/S2 noted, no murmurs/rubs/gallops noted LUNGS: Lungs are clear to auscultation bilaterally, no apparent distress ABDOMEN: soft, diffuse mild tenderness, hyperactive bowel sounds, no rebound or guarding GU:no cva tenderness NEURO: Pt is  awake/alert/appropriate, moves all extremitiesx4.  No facial droop.   EXTREMITIES: pulses normal/equal, full ROM SKIN: warm, color normal PSYCH: no abnormalities of mood noted, alert and oriented to situation  ED Course  Procedures  2:56 PM Pt with h/o cdif colitis, now with recurrent diarrhea Will rehydrate, give nausea/pain meds and reassess 3:31 PM D/w dr [name] We reviewed labs Pt with extensive h/o diarrhea and also cdif He will call in oral vancomycin for homegoing Recommends imodium TID Give IV KCL today If she can tolerate PO, can f/u next week if not improving with diarrhea Pt is nontoxic in appearance at this time. He requests her to stop PPI for now 4:02 PM Pt stable No acute distress She is comfortable with plan to replenish potassium, PO challenge and if tolerate PO will go home Will see dr [name] next week (she reports appt on 10/25) We discussed strict return precautions  Labs Review Labs Reviewed  BASIC METABOLIC PANEL - Abnormal; Notable for the following:    Potassium 3.2 (*)    Calcium 8.2 (*)    All other components within normal limits  CBC WITH DIFFERENTIAL/PLATELET - Abnormal; Notable for the following:    WBC 2.6 (*)    RBC 3.01 (*)    Hemoglobin 10.5 (*)    HCT 31.2 (*)    MCV 103.7 (*)    MCH 34.9 (*)    Platelets 139 (*)    Lymphs Abs 0.3 (*)    All other components within normal limits  C DIFFICILE QUICK SCREEN W PCR REFLEX    I have personally reviewed and evaluated these  lab results as part of my medical decision-making.    MDM   Final diagnoses:  Diarrhea, unspecified type  Dehydration  Hypokalemia    Nursing notes including past medical history and social history reviewed and considered in documentation Labs/vital reviewed myself and considered during evaluation Previous records reviewed and considered     Ripley Fraise, MD 02/16/15 [phone]

## 2015-02-21 ENCOUNTER — Ambulatory Visit (INDEPENDENT_AMBULATORY_CARE_PROVIDER_SITE_OTHER): Payer: Medicare PPO | Admitting: Internal Medicine

## 2015-02-21 ENCOUNTER — Encounter: Payer: Self-pay | Admitting: Internal Medicine

## 2015-02-21 ENCOUNTER — Telehealth: Payer: Self-pay

## 2015-02-21 VITALS — BP 131/65 | HR 65 | Temp 97.4°F | Ht 62.0 in | Wt 138.4 lb

## 2015-02-21 DIAGNOSIS — B9689 Other specified bacterial agents as the cause of diseases classified elsewhere: Secondary | ICD-10-CM

## 2015-02-21 DIAGNOSIS — K219 Gastro-esophageal reflux disease without esophagitis: Secondary | ICD-10-CM

## 2015-02-21 DIAGNOSIS — A047 Enterocolitis due to Clostridium difficile: Secondary | ICD-10-CM

## 2015-02-21 DIAGNOSIS — A498 Other bacterial infections of unspecified site: Secondary | ICD-10-CM

## 2015-02-21 NOTE — Telephone Encounter (Signed)
Per Dr. [name] I called prescriptions of Vancomycin and Levsin to Lanny Hurst at [facility].  Pt only had 8 tablets left of the Vanco 125 mg so I called in for 12 days at 4x daily, 3x daily x 7 days , 2x daily for 7 days and then once daily for 7 days then pt is to stop.   Also, Levsin 0.125 mg SL tablets, one under the tongue before bedtime and after meals as needed for abdominal pain and loose stools. #60 and no refills.

## 2015-02-21 NOTE — Patient Instructions (Signed)
Vancomycin 125 mg 4x daily x 2ks; then 3x daily x 7 days; then 2x daily x 7 days; then 1x daily x 7 days - then stop  Levsin 0.125 mg SL tablets  - one under the tongue before bedtime and after meals as needed for abdominal pain and loose stools  Information on CDiff provided  Office visit with me in 1 month.

## 2015-02-22 NOTE — Progress Notes (Signed)
Primary Care Physician:  Curlene Labrum, MD Primary Gastroenterologist:  Dr. [name]  Pre-Procedure History & Physical: HPI:  [name] is a 69 y.o. female here for follow-up recent protracted diarrheal illness felt to be due to Clostridium difficile. She was briefly hospitalized with rapid improvement with oral vancomycin. She just completed a course of oral vancomycin last week and within 2-3 days of completion of therapy she developed recurrent nonbloody diarrhea and incontinence. She called the office and we empirically reinstituted a course of vancomycin - to be tapered slowly for a protracted period of time. She rapidly began to feel bad and had incontinent episodes at work. Presented to the ED where she was given fluids and potassium supplementation. We had already called in a refill on vancomycin. Of note, her Clostridium difficile assay came back negative last week. She has been on oral vancomycin for 5 days and has, again, rapidly improved. Still having some abdominal cramps 3-4 loose stools daily - but no incontinence, being able to function-back at work. She continues to take chemotherapy. She has not had any fever or chills.  Has not been taking Protonix at prior direction. Does take a probiotic.  Past Medical History  Diagnosis Date  . Cancer (HCC)     RBreast, Lower back , Under l Arm  . Hypertension   . GERD (gastroesophageal reflux disease)   . Breast cancer metastasized to bone ([place]) 04/30/2011  . Radiation 01/15/2012    Lumbosacral spine 3500 cGy 14  fx  . Swollen R ankle 08/10/2012  . Fracture of ankle, medial malleolus, right, closed 08/10/2012  . Tubular adenoma 2011  . Hiatal hernia 2011  . Schatzki's ring   . Anorexia 03/02/2013  . Weight loss due to medication 03/02/2013    Past Surgical History  Procedure Laterality Date  . Gallbladder surgery  1991  . Mastectomy  06/28/10    bilateral- Dr. Rosebud Poles, [place]  . Inguinal hernia repair  90's   right  . Parathyroidectomy  90's  . Carpal tunnel release  90's  . Oophorectomy  90's    right  . Lymph node dissection    . Breast surgery    . Cholecystectomy    . Colonoscopy  01/08/10    Dr. [name]- anal tag,hemorrhoid o/w normal rectum, pancolonic diverticula, diminutive polyp in the base of the cecum= tubular adenoma on bx. poor prep  . Esophagogastroduodenoscopy  01/08/10    Dr. [name]- normal esophagus, small hiatal hernia, antum and body erosions, pedunculated polyp between D1 and D2, duodenal diverticulum, lymphangiectasia, second portion of the duodenum.stomach bx= inflammation, duodenum bx= adenoma  . Rotator cuff repair  08/27/11    Dr. Alphonzo Cruise- [facility]- [facility]  . Esophagogastroduodenoscopy (egd) with propofol N/A 11/19/2012    RMR: non-critical Schatzki's ring s/p dilation with 30 F, multiple gastric polyps, duodenal polyp s/p piecemeal snare polypectomy, gastric polypectomy, path benign  . Maloney dilation N/A 11/19/2012    Procedure: Venia Minks DILATION;  Surgeon: Daneil Dolin, MD;  Location: [facility];  Service: Endoscopy;  Laterality: N/A;  #54  . Polypectomy N/A 11/19/2012    Procedure: POLYPECTOMY;  Surgeon: Daneil Dolin, MD;  Location: [facility];  Service: Endoscopy;  Laterality: N/A;  duodenal  . Esophageal biopsy N/A 11/19/2012    Procedure: BIOPSY;  Surgeon: Daneil Dolin, MD;  Location: [facility];  Service: Endoscopy;  Laterality: N/A;  esophageal    Prior to Admission medications   Medication Sig Start Date End Date Taking?  Authorizing Provider  acidophilus (RISAQUAD) CAPS capsule Take 1 capsule by mouth daily.   Yes Historical Provider, MD  feeding supplement (BOOST / RESOURCE BREEZE) LIQD Take 1 Container by mouth 2 (two) times daily between meals. 01/28/15  Yes Samuella Cota, MD  fluticasone [facility]) 50 MCG/ACT nasal spray Place 2 sprays into both nostrils daily as needed for allergies or rhinitis.  12/08/14  Yes Historical Provider, MD  HYDROcodone-acetaminophen (NORCO)  10-325 MG tablet Take 1-2 tablets by mouth every 4 (four) hours as needed for moderate pain or severe pain. 02/08/15  Yes Chauncey Cruel, MD  letrozole [facility]) 2.5 MG tablet Take 1 tablet (2.5 mg total) by mouth daily. 08/18/14  Yes Chauncey Cruel, MD  lisinopril (PRINIVIL,ZESTRIL) 20 MG tablet Take 10 mg by mouth daily. 06/20/14  Yes Historical Provider, MD  mometasone (ELOCON) 0.1 % lotion apply topically daily as needed for itching 10/17/14  Yes Historical Provider, MD  ondansetron (ZOFRAN-ODT) 8 MG disintegrating tablet DISSOLVE 1 TABLET ON THE TONGUE EVERY 8 HOURS AS NEEDED FOR NAUSEA 02/09/15  Yes Chauncey Cruel, MD  palbociclib [facility]) 125 MG capsule Take 1 capsule (125 mg total) by mouth daily. Take 21days out of 28 with Letrozole, with food 12/23/14  Yes Chauncey Cruel, MD  Polyethyl Glycol-Propyl Glycol (SYSTANE OP) Apply 1 drop to eye daily as needed (dry eyes).   Yes Historical Provider, MD  vancomycin (VANCOCIN HCL) 125 MG capsule Take 1 capsule (125 mg total) by mouth 4 (four) times daily. 02/16/15  Yes Daneil Dolin, MD  warfarin (COUMADIN) 4 MG tablet Take 1 tablet (4 mg total) by mouth daily. 01/28/15  Yes Samuella Cota, MD    Allergies as of 02/21/2015 - Review Complete 02/21/2015  Allergen Reaction Noted  . Aspirin  11/13/2012  . Percodan [oxycodone-aspirin] Nausea Only   . Codeine Nausea Only     Family History  Problem Relation Age of Onset  . Cancer Mother     Colon  . Cancer Father     Lung, Mouth,leg    Social History   Social History  . Marital Status: Widowed    Spouse Name: N/A  . Number of Children: N/A  . Years of Education: N/A   Occupational History  . Not on file.   Social History Main Topics  . Smoking status: Former Smoker    Quit date: 11/22/1982  . Smokeless tobacco: Never Used     Comment: FQuit smoking x 32 years  . Alcohol Use: No  . Drug Use: No  . Sexual Activity: Not Currently   Other Topics Concern  . Not on file    Social History Narrative    Review of Systems: See HPI, otherwise negative ROS  Physical Exam: BP 131/65 mmHg  Pulse 65  Temp(Src) 97.4 F (36.3 C) (Oral)  Ht 5\' 2"  (1.575 m)  Wt 138 lb 6.4 oz (62.778 kg)  BMI 25.31 kg/m2 General:   Much improved appearing over that seen when she was in the hospital recently.  NAD Skin:  Intact without significant lesions or rashes. Eyes:  Sclera clear, no icterus.   Conjunctiva pink. Ears:  Normal auditory acuity. Nose:  No deformity, discharge,  or lesions. Mouth:  No deformity or lesions. Neck:  Supple; no masses or thyromegaly. No significant cervical adenopathy. Lungs:  Clear throughout to auscultation.   No wheezes, crackles, or rhonchi. No acute distress. Heart:  Regular rate and rhythm; no murmurs, clicks, rubs,  or gallops.  Abdomen: Non-distended, Somewhat hyper active bowel sounds.  Soft with very minimal diffuse tenderness to palpation. No appreciable mass or hepatosplenomegaly.  Pulses:  Normal pulses noted. Extremities:  Without clubbing or edema.   Impression:  Very pleasant lady with a very difficult situation with recurrent, metastatic breast cancer, ongoing chemotherapy recently diagnosed with Clostridium difficile infection in the setting of protracted diarrhea. She has recurrent Clostridium difficile infection, clinically, in spite of a recent negative Clostridium difficile toxin assay. High risk of recurrence in this setting anticipated.  GERD stable without PPI therapy at this time.   Recommendations:  Vancomycin 125 mg 4x daily x 2ks; then 3x daily x 7 days; then 2x daily x 7 days; then 1x daily x 7 days - then stop  Levsin 0.125 mg SL tablets  - one under the tongue before bedtime and after meals as needed for abdominal pain and loose stools  Information on CDiff provided  Avoid Protonix and PPIs along with probiotic therapy for now. May use H2 blocker/and antacids as needed for occasional GERD  Possibility of  additional recurrences reviewed. The possibility of additional treatment with Xifaxan reviewed. At would be a little leery of fecal transplantation in this immunosuppressed lady.  Office visit with me in 1 month.     Notice: This dictation was prepared with Dragon dictation along with smaller phrase technology. Any transcriptional errors that result from this process are unintentional and may not be corrected upon review.

## 2015-02-22 NOTE — Telephone Encounter (Signed)
Great.  Thanks

## 2015-02-23 ENCOUNTER — Encounter: Payer: Self-pay | Admitting: Oncology

## 2015-02-23 NOTE — Progress Notes (Signed)
Per diplomat ibrance will be shipped 02/24/15

## 2015-02-27 ENCOUNTER — Other Ambulatory Visit: Payer: Self-pay | Admitting: *Deleted

## 2015-02-28 ENCOUNTER — Other Ambulatory Visit: Payer: Self-pay | Admitting: *Deleted

## 2015-02-28 MED ORDER — WARFARIN SODIUM 4 MG PO TABS: 4.0000 mg | ORAL_TABLET | Freq: Every day | ORAL | Status: AC

## 2015-03-01 ENCOUNTER — Encounter: Payer: Self-pay | Admitting: Oncology

## 2015-03-01 NOTE — Progress Notes (Signed)
Pt is approved w/ Patient [name] for $5000 for her chemo drugs for 12 months from 12/24/14 and will go back 6 months from date of letter. $8350 is a guaranteed award and the remaining award balance of $3350 is accessible on a [employer] basis. I forwarded copy of letter and proof of expenditure form to Arline Asp in the billing dept.

## 2015-03-16 ENCOUNTER — Telehealth: Payer: Self-pay | Admitting: *Deleted

## 2015-03-16 NOTE — Telephone Encounter (Signed)
Voicemail: "[name] with [facility] for Dr. Pleas Koch.  Calling to check up on vaccines for her." Called office to verify a date of birth.  Carla RN confirmed and asked if patient may receive Flu and Pneumovax vaccines  Verbal order received and read back from Dr. [name] for these vaccines to be administered.  Order given to [employer] at this time.  Patient returns to their office 03-24-2015 for lab and they will administer at this time.

## 2015-03-24 NOTE — Telephone Encounter (Signed)
Today's vaccines documented.

## 2015-03-28 ENCOUNTER — Encounter: Payer: Self-pay | Admitting: Internal Medicine

## 2015-03-28 ENCOUNTER — Ambulatory Visit (INDEPENDENT_AMBULATORY_CARE_PROVIDER_SITE_OTHER): Payer: Medicare PPO | Admitting: Internal Medicine

## 2015-03-28 VITALS — BP 139/68 | HR 83 | Temp 97.5°F | Ht 62.0 in | Wt 140.2 lb

## 2015-03-28 DIAGNOSIS — B9689 Other specified bacterial agents as the cause of diseases classified elsewhere: Secondary | ICD-10-CM | POA: Diagnosis not present

## 2015-03-28 DIAGNOSIS — K219 Gastro-esophageal reflux disease without esophagitis: Secondary | ICD-10-CM

## 2015-03-28 DIAGNOSIS — A498 Other bacterial infections of unspecified site: Secondary | ICD-10-CM

## 2015-03-28 MED ORDER — HYOSCYAMINE SULFATE 0.125 MG SL SUBL: 0.1250 mg | SUBLINGUAL_TABLET | Freq: Three times a day (TID) | SUBLINGUAL | Status: DC

## 2015-03-28 NOTE — Patient Instructions (Signed)
Finish out Vancomycin treatment  Use Levsin 0.125 mg under the tongue before meals and at bedtime as needed for occasional diarrhea and abdominal cramps  GERD information  May use Rolaids for occasional heartburn / indigestion  Office visit in 3 months  If recurrent diarrhea lasting more than 48 hours please call me

## 2015-03-28 NOTE — Progress Notes (Signed)
Primary Care Physician:  Curlene Labrum, MD Primary Gastroenterologist:  Dr. [name]  Pre-Procedure History & Physical: HPI:  [name] is a 69 y.o. female here for followup of treatment for recurrent Clostridium difficile infection. She is to finish a protracted course of vancomycin taper tomorrow. Diarrhea resolved. She has on the order of 3 formed bowel movements daily;  she rarely has a  loose stool. No incontinence. No abdominal pain. No rectal bleeding. She's not had any nausea or vomiting. She's actually gained 2 pounds since her last visit. Reflux symptoms are fairly quiescent currently.   At this time, only taking a couple of Rolaids weekly without any other specific ongoing acid suppression therapy.  No dysphagia.  Only takes Imodium for occasional diarrhea. Never got Levsin prescription filled.  She continues to work 35 hours weekly at [employer].Marland Kitchen  Past Medical History  Diagnosis Date  . Cancer (HCC)     RBreast, Lower back , Under l Arm  . Hypertension   . GERD (gastroesophageal reflux disease)   . Breast cancer metastasized to bone ([place]) 04/30/2011  . Radiation 01/15/2012    Lumbosacral spine 3500 cGy 14  fx  . Swollen R ankle 08/10/2012  . Fracture of ankle, medial malleolus, right, closed 08/10/2012  . Tubular adenoma 2011  . Hiatal hernia 2011  . Schatzki's ring   . Anorexia 03/02/2013  . Weight loss due to medication 03/02/2013    Past Surgical History  Procedure Laterality Date  . Gallbladder surgery  1991  . Mastectomy  06/28/10    bilateral- Dr. Rosebud Poles, [place]  . Inguinal hernia repair  90's    right  . Parathyroidectomy  90's  . Carpal tunnel release  90's  . Oophorectomy  90's    right  . Lymph node dissection    . Breast surgery    . Cholecystectomy    . Colonoscopy  01/08/10    Dr. [name]- anal tag,hemorrhoid o/w normal rectum, pancolonic diverticula, diminutive polyp in the base of the cecum= tubular adenoma on bx. poor prep  .  Esophagogastroduodenoscopy  01/08/10    Dr. [name]- normal esophagus, small hiatal hernia, antum and body erosions, pedunculated polyp between D1 and D2, duodenal diverticulum, lymphangiectasia, second portion of the duodenum.stomach bx= inflammation, duodenum bx= adenoma  . Rotator cuff repair  08/27/11    Dr. Alphonzo Cruise- [facility]- [facility] Noss  . Esophagogastroduodenoscopy (egd) with propofol N/A 11/19/2012    RMR: non-critical Schatzki's ring s/p dilation with 81 F, multiple gastric polyps, duodenal polyp s/p piecemeal snare polypectomy, gastric polypectomy, path benign  . Maloney dilation N/A 11/19/2012    Procedure: Venia Minks DILATION;  Surgeon: Daneil Dolin, MD;  Location: [facility];  Service: Endoscopy;  Laterality: N/A;  #54  . Polypectomy N/A 11/19/2012    Procedure: POLYPECTOMY;  Surgeon: Daneil Dolin, MD;  Location: [facility];  Service: Endoscopy;  Laterality: N/A;  duodenal  . Esophageal biopsy N/A 11/19/2012    Procedure: BIOPSY;  Surgeon: Daneil Dolin, MD;  Location: [facility];  Service: Endoscopy;  Laterality: N/A;  esophageal    Prior to Admission medications   Medication Sig Start Date End Date Taking? Authorizing Provider  acidophilus (RISAQUAD) CAPS capsule Take 1 capsule by mouth daily.   Yes Historical Provider, MD  cholecalciferol (VITAMIN D) 1000 UNITS tablet Take 2,000 Units by mouth daily.   Yes Historical Provider, MD  feeding supplement (BOOST / RESOURCE BREEZE) LIQD Take 1 Container by mouth 2 (two) times  daily between meals. 01/28/15  Yes Samuella Cota, MD  fluticasone [facility]) 50 MCG/ACT nasal spray Place 2 sprays into both nostrils daily as needed for allergies or rhinitis.  12/08/14  Yes Historical Provider, MD  HYDROcodone-acetaminophen (NORCO) 10-325 MG tablet Take 1-2 tablets by mouth every 4 (four) hours as needed for moderate pain or severe pain. 02/08/15  Yes Chauncey Cruel, MD  letrozole [facility]) 2.5 MG tablet Take 1 tablet (2.5 mg total) by mouth daily. 08/18/14  Yes Chauncey Cruel, MD  lisinopril (PRINIVIL,ZESTRIL) 20 MG tablet Take 10 mg by mouth daily. 06/20/14  Yes Historical Provider, MD  mometasone (ELOCON) 0.1 % lotion apply topically daily as needed for itching 10/17/14  Yes Historical Provider, MD  ondansetron (ZOFRAN-ODT) 8 MG disintegrating tablet DISSOLVE 1 TABLET ON THE TONGUE EVERY 8 HOURS AS NEEDED FOR NAUSEA 02/09/15  Yes Chauncey Cruel, MD  palbociclib [facility]) 125 MG capsule Take 1 capsule (125 mg total) by mouth daily. Take 21days out of 28 with Letrozole, with food 12/23/14  Yes Chauncey Cruel, MD  Polyethyl Glycol-Propyl Glycol (SYSTANE OP) Apply 1 drop to eye daily as needed (dry eyes).   Yes Historical Provider, MD  vancomycin (VANCOCIN HCL) 125 MG capsule Take 1 capsule (125 mg total) by mouth 4 (four) times daily. 02/16/15  Yes Daneil Dolin, MD  warfarin (COUMADIN) 4 MG tablet Take 1 tablet (4 mg total) by mouth daily. 02/28/15  Yes Chauncey Cruel, MD  hyoscyamine (LEVSIN/SL) 0.125 MG SL tablet Place 1 tablet (0.125 mg total) under the tongue 4 (four) times daily -  before meals and at bedtime. Prn diarrhea and abdominal cramping. 03/28/15   Daneil Dolin, MD    Allergies as of 03/28/2015 - Review Complete 03/28/2015  Allergen Reaction Noted  . Aspirin  11/13/2012  . Percodan [oxycodone-aspirin] Nausea Only   . Codeine Nausea Only     Family History  Problem Relation Age of Onset  . Cancer Mother     Colon  . Cancer Father     Lung, Mouth,leg    Social History   Social History  . Marital Status: Widowed    Spouse Name: N/A  . Number of Children: N/A  . Years of Education: N/A   Occupational History  . Not on file.   Social History Main Topics  . Smoking status: Former Smoker    Quit date: 11/22/1982  . Smokeless tobacco: Never Used     Comment: FQuit smoking x 32 years  . Alcohol Use: No  . Drug Use: No  . Sexual Activity: Not Currently   Other Topics Concern  . Not on file   Social History Narrative     Review of Systems: See HPI, otherwise negative ROS  Physical Exam: BP 139/68 mmHg  Pulse 83  Temp(Src) 97.5 F (36.4 C) (Oral)  Ht 5\' 2"  (1.575 m)  Wt 140 lb 3.2 oz (63.594 kg)  BMI 25.64 kg/m2 General:   Alert, chronically ill pleasant and cooperative in NAD Skin:  Intact without significant lesions or rashes.   No scleral icterus. Neck:  Supple; no masses or thyromegaly. No significant cervical adenopathy. Lungs:  Clear throughout to auscultation.   No wheezes, crackles, or rhonchi. No acute distress. Heart:  Regular rate and rhythm; no murmurs, clicks, rubs,  or gallops. Abdomen: Non-distended, normal bowel sounds.  Soft and nontender without appreciable mass or hepatosplenomegaly.  Pulses:  Normal pulses noted. Extremities:  Without clubbing or edema.  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 Diarrhea has essentially resolved. She'll be finishing up a tapering regimen of vancomycin tomorrow.  I discussed the potential for recurrence/relapse. I also discussed the importance of notifying us if she has 48 hours of incessant diarrhea. The potential for post infectious irritable bowel bowel syndrome-diarrhea predominant also reviewed.  GERD symptoms are infrequent without any alarm features. She takes only occasional and acids.   Recommendations:  Finish out Vancomycin treatment  Use Levsin 0.125 mg under the tongue before meals and at bedtime as needed for occasional diarrhea and abdominal cramps  GERD information  May use Rolaids for occasional heartburn / indigestion  Office visit in 3 months  If recurrent diarrhea lasting more than 48 hours please call me      Notice: This dictation was prepared with Dragon dictation along with smaller phrase technology. Any transcriptional errors that  result from this process are unintentional and may not be corrected upon review.

## 2015-04-14 ENCOUNTER — Other Ambulatory Visit: Payer: Self-pay | Admitting: *Deleted

## 2015-04-18 ENCOUNTER — Other Ambulatory Visit: Payer: Self-pay

## 2015-04-18 ENCOUNTER — Telehealth: Payer: Self-pay

## 2015-04-18 DIAGNOSIS — C7951 Secondary malignant neoplasm of bone: Principal | ICD-10-CM

## 2015-04-18 DIAGNOSIS — C50919 Malignant neoplasm of unspecified site of unspecified female breast: Secondary | ICD-10-CM

## 2015-04-18 MED ORDER — PALBOCICLIB 125 MG PO CAPS: 125.0000 mg | ORAL_CAPSULE | Freq: Every day | ORAL | Status: DC

## 2015-04-18 NOTE — Telephone Encounter (Signed)
Doesn't necessarily sound like recurrent C. Difficile. Recommend maintain hydration. Observe for diarrhea. Let's touch base with her in the afternoon tomorrow and see how her bowel function has been.  Continue Levsin and Zofran as needed

## 2015-04-18 NOTE — Telephone Encounter (Signed)
Pt is aware of recommendations

## 2015-04-18 NOTE — Telephone Encounter (Signed)
Pt called- last Thursday she had diarrhea and vomiting for 9 hours, she got better and didn't have any more episodes all weekend. This morning she got up and is having abd pain and nausea. She has not vomited today. No fever. She is taking levsin about three times a day and has taken zofran this morning. Both medications seem to be helping. She said she was told to call if she had any more problems and wanted to let RMR know what was going on. Pt is working today until [employer].  Pt is concerned and wanted to make sure she was doing everything correctly and to see if there is anything else she needs to do.

## 2015-04-19 NOTE — Telephone Encounter (Signed)
Pt called back today, she said she was feeling much better than she was yesterday. No nausea, no pain, no diarrhea, no vomiting. She is not sure what was going on yesterday and last Thursday. I asked pt to start keeping a food diary to see if her symptoms come back if they do maybe it is caused by the food she is eating. She said she would try it and will let us know.

## 2015-04-19 NOTE — Telephone Encounter (Signed)
Follow up noted.

## 2015-04-30 ENCOUNTER — Emergency Department (HOSPITAL_COMMUNITY)
Admission: EM | Admit: 2015-04-30 | Discharge: 2015-04-30 | Disposition: A | Discharge status: Home / Self Care | Payer: Medicare PPO | Attending: Emergency Medicine | Admitting: Emergency Medicine

## 2015-04-30 ENCOUNTER — Emergency Department (HOSPITAL_COMMUNITY): Payer: Medicare PPO

## 2015-04-30 ENCOUNTER — Encounter (HOSPITAL_COMMUNITY): Payer: Self-pay | Admitting: Emergency Medicine

## 2015-04-30 DIAGNOSIS — I10 Essential (primary) hypertension: Secondary | ICD-10-CM | POA: Insufficient documentation

## 2015-04-30 DIAGNOSIS — Z8619 Personal history of other infectious and parasitic diseases: Secondary | ICD-10-CM | POA: Insufficient documentation

## 2015-04-30 DIAGNOSIS — Z8781 Personal history of (healed) traumatic fracture: Secondary | ICD-10-CM | POA: Insufficient documentation

## 2015-04-30 DIAGNOSIS — Z8701 Personal history of pneumonia (recurrent): Secondary | ICD-10-CM | POA: Diagnosis not present

## 2015-04-30 DIAGNOSIS — Z862 Personal history of diseases of the blood and blood-forming organs and certain disorders involving the immune mechanism: Secondary | ICD-10-CM | POA: Insufficient documentation

## 2015-04-30 DIAGNOSIS — M549 Dorsalgia, unspecified: Secondary | ICD-10-CM

## 2015-04-30 DIAGNOSIS — M546 Pain in thoracic spine: Secondary | ICD-10-CM | POA: Diagnosis not present

## 2015-04-30 DIAGNOSIS — Z87891 Personal history of nicotine dependence: Secondary | ICD-10-CM | POA: Diagnosis not present

## 2015-04-30 DIAGNOSIS — Z7901 Long term (current) use of anticoagulants: Secondary | ICD-10-CM | POA: Diagnosis not present

## 2015-04-30 DIAGNOSIS — Z760 Encounter for issue of repeat prescription: Secondary | ICD-10-CM | POA: Diagnosis not present

## 2015-04-30 DIAGNOSIS — Z79899 Other long term (current) drug therapy: Secondary | ICD-10-CM | POA: Insufficient documentation

## 2015-04-30 DIAGNOSIS — Z853 Personal history of malignant neoplasm of breast: Secondary | ICD-10-CM | POA: Diagnosis not present

## 2015-04-30 DIAGNOSIS — Q394 Esophageal web: Secondary | ICD-10-CM | POA: Insufficient documentation

## 2015-04-30 DIAGNOSIS — Z8719 Personal history of other diseases of the digestive system: Secondary | ICD-10-CM | POA: Insufficient documentation

## 2015-04-30 HISTORY — DX: Pneumonia, unspecified organism: J18.9

## 2015-04-30 HISTORY — DX: Enterocolitis due to Clostridium difficile, not specified as recurrent: A04.72

## 2015-04-30 MED ORDER — CYCLOBENZAPRINE HCL 10 MG PO TABS: 10.0000 mg | ORAL_TABLET | Freq: Two times a day (BID) | ORAL | Status: DC | PRN

## 2015-04-30 MED ORDER — KETOROLAC TROMETHAMINE 60 MG/2ML IM SOLN
60.0000 mg | Freq: Once | INTRAMUSCULAR | Status: AC
  Administered 2015-04-30: 60 mg via INTRAMUSCULAR
  Filled 2015-04-30: qty 2

## 2015-04-30 MED ORDER — CYCLOBENZAPRINE HCL 10 MG PO TABS
5.0000 mg | ORAL_TABLET | Freq: Once | ORAL | Status: AC
  Administered 2015-04-30: 5 mg via ORAL
  Filled 2015-04-30: qty 1

## 2015-04-30 NOTE — ED Provider Notes (Signed)
CSN: [id number]     Arrival date & time 04/30/15  1259 History   First MD Initiated Contact with Patient 04/30/15 1403     Chief Complaint  Patient presents with  . Back Pain     (Consider location/radiation/quality/duration/timing/severity/associated sxs/prior Treatment) HPI..... Intermittent sharp spasm-like pain in mid left back area since last night. Symptoms are worse with deep breath. Patient has history of metastatic breast cancer. No fever, chills, sputum production, chest pain, dyspnea. Recent bone scan on 01/01/2015 reveals no metastatic osseous findings. Severity of pain is moderate.  Past Medical History  Diagnosis Date  . Cancer (HCC)     RBreast, Lower back , Under l Arm  . Hypertension   . GERD (gastroesophageal reflux disease)   . Breast cancer metastasized to bone ([place]) 04/30/2011  . Radiation 01/15/2012    Lumbosacral spine 3500 cGy 14  fx  . Swollen R ankle 08/10/2012  . Fracture of ankle, medial malleolus, right, closed 08/10/2012  . Tubular adenoma 2011  . Hiatal hernia 2011  . Schatzki's ring   . Anorexia 03/02/2013  . Weight loss due to medication 03/02/2013  . C. difficile colitis   . Pneumonia    Past Surgical History  Procedure Laterality Date  . Gallbladder surgery  1991  . Mastectomy  06/28/10    bilateral- Dr. Rosebud Poles, [place]  . Inguinal hernia repair  90's    right  . Parathyroidectomy  90's  . Carpal tunnel release  90's  . Oophorectomy  90's    right  . Lymph node dissection    . Breast surgery    . Cholecystectomy    . Colonoscopy  01/08/10    Dr. [name]- anal tag,hemorrhoid o/w normal rectum, pancolonic diverticula, diminutive polyp in the base of the cecum= tubular adenoma on bx. poor prep  . Esophagogastroduodenoscopy  01/08/10    Dr. [name]- normal esophagus, small hiatal hernia, antum and body erosions, pedunculated polyp between D1 and D2, duodenal diverticulum, lymphangiectasia, second portion of the duodenum.stomach bx=  inflammation, duodenum bx= adenoma  . Rotator cuff repair  08/27/11    Dr. Alphonzo Cruise- [facility]- [facility] Noss  . Esophagogastroduodenoscopy (egd) with propofol N/A 11/19/2012    RMR: non-critical Schatzki's ring s/p dilation with 14 F, multiple gastric polyps, duodenal polyp s/p piecemeal snare polypectomy, gastric polypectomy, path benign  . Maloney dilation N/A 11/19/2012    Procedure: Venia Minks DILATION;  Surgeon: Daneil Dolin, MD;  Location: [facility];  Service: Endoscopy;  Laterality: N/A;  #54  . Polypectomy N/A 11/19/2012    Procedure: POLYPECTOMY;  Surgeon: Daneil Dolin, MD;  Location: [facility];  Service: Endoscopy;  Laterality: N/A;  duodenal  . Esophageal biopsy N/A 11/19/2012    Procedure: BIOPSY;  Surgeon: Daneil Dolin, MD;  Location: [facility];  Service: Endoscopy;  Laterality: N/A;  esophageal   Family History  Problem Relation Age of Onset  . Cancer Mother     Colon  . Cancer Father     Lung, Mouth,leg   Social History  Substance Use Topics  . Smoking status: Former Smoker    Quit date: 11/22/1982  . Smokeless tobacco: Never Used     Comment: FQuit smoking x 32 years  . Alcohol Use: No   OB History    No data available     Review of Systems  All other systems reviewed and are negative.     Allergies  Aspirin; Percodan; and Codeine  Home Medications   Prior to Admission medications  Medication Sig Start Date End Date Taking? Authorizing Provider  acidophilus (RISAQUAD) CAPS capsule Take 1 capsule by mouth daily.   Yes Historical Provider, MD  cholecalciferol (VITAMIN D) 1000 UNITS tablet Take 2,000 Units by mouth daily.   Yes Historical Provider, MD  fluticasone (FLONASE) 50 MCG/ACT nasal spray Place 2 sprays into both nostrils daily as needed for allergies or rhinitis.  12/08/14  Yes Historical Provider, MD  HYDROcodone-acetaminophen (NORCO) 10-325 MG tablet Take 1-2 tablets by mouth every 4 (four) hours as needed for moderate pain or severe pain. 02/08/15  Yes Chauncey Cruel, MD  hyoscyamine (LEVSIN/SL) 0.125 MG SL tablet Place 1 tablet (0.125 mg total) under the tongue 4 (four) times daily -  before meals and at bedtime. Prn diarrhea and abdominal cramping. 03/28/15  Yes Daneil Dolin, MD  letrozole [facility]) 2.5 MG tablet Take 1 tablet (2.5 mg total) by mouth daily. 08/18/14  Yes Chauncey Cruel, MD  lisinopril (PRINIVIL,ZESTRIL) 20 MG tablet Take 10 mg by mouth daily. 06/20/14  Yes Historical Provider, MD  mometasone (ELOCON) 0.1 % lotion apply topically daily as needed for itching 10/17/14  Yes Historical Provider, MD  ondansetron (ZOFRAN-ODT) 8 MG disintegrating tablet DISSOLVE 1 TABLET ON THE TONGUE EVERY 8 HOURS AS NEEDED FOR NAUSEA 02/09/15  Yes Chauncey Cruel, MD  palbociclib [facility]) 125 MG capsule Take 1 capsule (125 mg total) by mouth daily. Take 21days out of 28 with Letrozole, with food 04/18/15  Yes Chauncey Cruel, MD  Polyethyl Glycol-Propyl Glycol (SYSTANE OP) Apply 1 drop to eye daily as needed (dry eyes).   Yes Historical Provider, MD  warfarin (COUMADIN) 4 MG tablet Take 1 tablet (4 mg total) by mouth daily. Patient taking differently: Take 2-4 mg by mouth See admin instructions. Take 2 mg on Monday, Wednesday and Friday.  Take 4 mg all other days. 02/28/15  Yes Chauncey Cruel, MD  cyclobenzaprine (FLEXERIL) 10 MG tablet Take 1 tablet (10 mg total) by mouth 2 (two) times daily as needed for muscle spasms. 04/30/15   Nat Christen, MD  feeding supplement (BOOST / RESOURCE BREEZE) LIQD Take 1 Container by mouth 2 (two) times daily between meals. 01/28/15   Samuella Cota, MD  vancomycin (VANCOCIN HCL) 125 MG capsule Take 1 capsule (125 mg total) by mouth 4 (four) times daily. Patient not taking: Reported on 04/30/2015 02/16/15   Daneil Dolin, MD   BP 143/77 mmHg  Pulse 61  Temp(Src) 98.4 F (36.9 C) (Oral)  Resp 18  Ht 5\' 4"  (1.626 m)  Wt 135 lb (61.236 kg)  BMI 23.16 kg/m2  SpO2 96% Physical Exam  Constitutional: She is oriented  to person, place, and time. She appears well-developed and well-nourished.  HENT:  Head: Normocephalic and atraumatic.  Eyes: Conjunctivae and EOM are normal. Pupils are equal, round, and reactive to light.  Neck: Normal range of motion. Neck supple.  Cardiovascular: Normal rate and regular rhythm.   Pulmonary/Chest: Effort normal and breath sounds normal.  Abdominal: Soft. Bowel sounds are normal.  Musculoskeletal:  Muscular tenderness left of the thoracic spine at approximately T10  Neurological: She is alert and oriented to person, place, and time.  Skin: Skin is warm and dry.  Psychiatric: She has a normal mood and affect. Her behavior is normal.  Nursing note and vitals reviewed.   ED Course  Procedures (including critical care time) Labs Review Labs Reviewed - No data to display  Imaging Review Dg Chest 2  View  04/30/2015  CLINICAL DATA:  Initial encounter for lower left thoracic pain that started last night. EXAM: CHEST  2 VIEW COMPARISON:  12/14/2014 FINDINGS: Lungs are hyperexpanded with underlying chronic interstitial changes. Surgical clips overlie the axillary regions bilaterally. No focal airspace consolidation or pulmonary edema. Biapical pleural-parenchymal opacity is stable, compatible with scarring. The cardiopericardial silhouette is within normal limits for size. The visualized bony structures of the thorax are intact. IMPRESSION: No acute cardiopulmonary findings. Electronically Signed   By: Misty Febus M.D.   On: 04/30/2015 13:37   Dg Thoracic Spine 2 View  04/30/2015  CLINICAL DATA:  Initial encounter for Left back pain at T-10 level, spasm and pain , no injury EXAM: THORACIC SPINE 2 VIEWS COMPARISON:  Chest radiograph of 04/30/2015. FINDINGS: Surgical clips which project over the right side of the chest. Surgical changes also project over the lower left hemi thorax. Mild convex right thoracic spine curvature. Frontal radiograph demonstrates normal paraspinous  contours. The lateral view images from approximately the top of T3 through the bottom of L2. Maintenance of vertebral body height and alignment across these levels. Advanced lower thoracic spondylosis, with endplate osteophytes. No focal osseous lesion. Swimmer's view to does not well evaluate the cervical thoracic junction. IMPRESSION: Suboptimal evaluation of the upper thoracic spine. Lower thoracic spondylosis, without acute finding from T3 inferiorly. Electronically Signed   By: Abigail Miyamoto M.D.   On: 04/30/2015 15:21   I have personally reviewed and evaluated these images and lab results as part of my medical decision-making.   EKG Interpretation None      MDM   Final diagnoses:  Mid back pain    Patient is in no respiratory distress. Chest x-ray and thoracic spine films show no acute abnormalities. IM Toradol 60 mg given. Prescription for Flexeril 10 mg.    Nat Christen, MD 04/30/15 1547

## 2015-04-30 NOTE — ED Notes (Signed)
Pt reports lower LT thoracic pain that began last night. Denies SOB or cough. Pt reports pain increases with movement.

## 2015-04-30 NOTE — Discharge Instructions (Signed)
X-ray showed no acute findings. Prescription for muscle relaxer. Continue take your pain pills as needed.

## 2015-05-11 ENCOUNTER — Other Ambulatory Visit (HOSPITAL_BASED_OUTPATIENT_CLINIC_OR_DEPARTMENT_OTHER): Payer: Medicare PPO

## 2015-05-11 ENCOUNTER — Ambulatory Visit (HOSPITAL_BASED_OUTPATIENT_CLINIC_OR_DEPARTMENT_OTHER): Payer: Medicare PPO | Admitting: Oncology

## 2015-05-11 ENCOUNTER — Telehealth: Payer: Self-pay | Admitting: Oncology

## 2015-05-11 VITALS — BP 150/71 | HR 71 | Temp 98.0°F | Resp 17 | Ht 64.0 in | Wt 135.5 lb

## 2015-05-11 DIAGNOSIS — Z86718 Personal history of other venous thrombosis and embolism: Secondary | ICD-10-CM

## 2015-05-11 DIAGNOSIS — C773 Secondary and unspecified malignant neoplasm of axilla and upper limb lymph nodes: Secondary | ICD-10-CM | POA: Diagnosis not present

## 2015-05-11 DIAGNOSIS — C7951 Secondary malignant neoplasm of bone: Secondary | ICD-10-CM | POA: Diagnosis not present

## 2015-05-11 DIAGNOSIS — J984 Other disorders of lung: Secondary | ICD-10-CM

## 2015-05-11 DIAGNOSIS — C50919 Malignant neoplasm of unspecified site of unspecified female breast: Secondary | ICD-10-CM

## 2015-05-11 DIAGNOSIS — C44501 Unspecified malignant neoplasm of skin of breast: Secondary | ICD-10-CM

## 2015-05-11 DIAGNOSIS — Z7901 Long term (current) use of anticoagulants: Secondary | ICD-10-CM

## 2015-05-11 DIAGNOSIS — C50811 Malignant neoplasm of overlapping sites of right female breast: Secondary | ICD-10-CM

## 2015-05-11 DIAGNOSIS — G4700 Insomnia, unspecified: Secondary | ICD-10-CM | POA: Diagnosis not present

## 2015-05-11 DIAGNOSIS — Z8672 Personal history of thrombophlebitis: Secondary | ICD-10-CM

## 2015-05-11 DIAGNOSIS — N393 Stress incontinence (female) (male): Secondary | ICD-10-CM

## 2015-05-11 DIAGNOSIS — C50112 Malignant neoplasm of central portion of left female breast: Secondary | ICD-10-CM

## 2015-05-11 DIAGNOSIS — C50912 Malignant neoplasm of unspecified site of left female breast: Secondary | ICD-10-CM

## 2015-05-11 DIAGNOSIS — C50812 Malignant neoplasm of overlapping sites of left female breast: Secondary | ICD-10-CM

## 2015-05-11 DIAGNOSIS — C50911 Malignant neoplasm of unspecified site of right female breast: Secondary | ICD-10-CM | POA: Insufficient documentation

## 2015-05-11 LAB — COMPREHENSIVE METABOLIC PANEL
ALBUMIN: 3.6 g/dL (ref 3.5–5.0)
ALK PHOS: 63 U/L (ref 40–150)
ALT: 9 U/L (ref 0–55)
AST: 16 U/L (ref 5–34)
Anion Gap: 8 mEq/L (ref 3–11)
BUN: 20.6 mg/dL (ref 7.0–26.0)
CHLORIDE: 108 meq/L (ref 98–109)
CO2: 26 meq/L (ref 22–29)
Calcium: 9.5 mg/dL (ref 8.4–10.4)
Creatinine: 0.8 mg/dL (ref 0.6–1.1)
EGFR: 70 mL/min/{1.73_m2} — AB (ref >90)
GLUCOSE: 97 mg/dL (ref 70–140)
POTASSIUM: 4.1 meq/L (ref 3.5–5.1)
SODIUM: 142 meq/L (ref 136–145)
Total Bilirubin: 0.44 mg/dL (ref 0.20–1.20)
Total Protein: 6.9 g/dL (ref 6.4–8.3)

## 2015-05-11 LAB — CBC WITH DIFFERENTIAL/PLATELET
BASO%: 2.2 % — ABNORMAL HIGH (ref 0.0–2.0)
BASOS ABS: 0.1 10*3/uL (ref 0.0–0.1)
EOS ABS: 0 10*3/uL (ref 0.0–0.5)
EOS%: 0.8 % (ref 0.0–7.0)
HCT: 32.5 % — ABNORMAL LOW (ref 34.8–46.6)
HGB: 10.9 g/dL — ABNORMAL LOW (ref 11.6–15.9)
LYMPH%: 11 % — AB (ref 14.0–49.7)
MCH: 34.1 pg — AB (ref 25.1–34.0)
MCHC: 33.5 g/dL (ref 31.5–36.0)
MCV: 101.6 fL — AB (ref 79.5–101.0)
MONO#: 0.3 10*3/uL (ref 0.1–0.9)
MONO%: 10.5 % (ref 0.0–14.0)
NEUT#: 2.2 10*3/uL (ref 1.5–6.5)
NEUT%: 75.5 % (ref 38.4–76.8)
Platelets: 227 10*3/uL (ref 145–400)
RBC: 3.2 10*6/uL — AB (ref 3.70–5.45)
RDW: 17.2 % — ABNORMAL HIGH (ref 11.2–14.5)
WBC: 2.9 10*3/uL — ABNORMAL LOW (ref 3.9–10.3)
lymph#: 0.3 10*3/uL — ABNORMAL LOW (ref 0.9–3.3)

## 2015-05-11 LAB — PROTIME-INR
INR: 1.7 — ABNORMAL LOW (ref 2.00–3.50)
Protime: 20.4 Seconds — ABNORMAL HIGH (ref 10.6–13.4)

## 2015-05-11 MED ORDER — HYDROCODONE-ACETAMINOPHEN 10-325 MG PO TABS: 1.0000 | ORAL_TABLET | ORAL | Status: DC | PRN

## 2015-05-11 MED ORDER — LETROZOLE 2.5 MG PO TABS: 2.5000 mg | ORAL_TABLET | Freq: Every day | ORAL | Status: DC

## 2015-05-11 MED ORDER — IBANDRONATE SODIUM 150 MG PO TABS: 150.0000 mg | ORAL_TABLET | ORAL | Status: DC

## 2015-05-11 NOTE — Progress Notes (Signed)
[name]  [phone] [phone]     ID: [name] OB: 08-Sep-1945  MR#: [id number]  PVV#:[id number]  PCP: Curlene Labrum, MD GYN:  Gari Crown  SU:  OTHER MD: Gery Pray, Sherre Poot  CHIEF COMPLAINT: stage IV breast cancer  CURRENT TREATMENT: letrozole + palbocilcib [name]; denosumab  BREAST CANCER HISTORY: From Dr. Adria Dill summary 07/14/2013::  "Complex 70 year old woman with history of ER-positive and ER negative metachronous primary bilateral breast cancers.  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 Progression in bone while on tamoxifen September 2011. Initial complete response to monthly Faslodex injections.She developed a malignant left axillary lymph node with no clear primary in the left breast November 2011. This tumor was ER negative. HER-2 negative. While under evaluation for this lesion she developed a new lesion in the right breast which was ER positive. She underwent bilateral mastectomies 06/28/2010. She was continued on hormonal therapy with monthly Faslodex injections in view of the ER positive component of her tumor with a complete response to this hormone in bone. She underwent radiation to the left chest and axilla subsequent to the mastectomies. She developed a cutaneous recurrence on the right chest wall along the medial aspect of the right mastectomy scar, biopsy proven, initially detected on clinical exam by her radiation oncologist on 02/14/2011. She was treated with electron beam radiation.. 3000 cGy in 15 fractions through December 2012. Further progression in bone in August 2013. Palliative radiation to lumbosacral spine September through October 2013. Brief trial of Xeloda November 2013 through January 2014.  Change to tamoxifen plus [employer] February 2014 stopped for progression and 25 pound weight loss November 2014. Trial of Megace 80 mg twice a day with improved appetite but progressive rise in tumor marker and progressive low back pain with further progression on MRI of the spine done January 2015. Radiation to the lumbar spine February 2015. She started most recent salvage regimen with a combination of Ibrance plus Femara on 07/08/2013. It is too early to assess response."  The patient's breast cancer history is summarized in detailed below   INTERVAL HISTORY: [name] returns today for followup of her estrogen receptor positive metastatic breast cancer. She continues on letrozole and Palbociclib. She tolerates the letrozole with occasional hot flashes, no vaginal dryness. She does terrific with the Palbociclib, with no side effects that she is aware of.  REVIEW OF SYSTEMS: [name] Developed back spasms on 04/30/2015 which took her to the emergency room. Plain spine films and chest x-ray were unremarkable and in particular the prior left lung cavitary lesion was not seen. She was treated with Flexeril. Approximately a week later she had a left lower leg spasm which made her screen, she says. That has not recurred. -- she is now 35 hours a week instead of 40 because she saw a bear in her backyard and she does not like the idea of leaving so early in the morning in the dark with very sent Elijio Miles all around. She still has problems with insomnia. She feels tired. She has pains in her chest legs and back which are not more intense or persistent than before. She complains of blurred vision which is also not a new problem. She has seasonal allergies. Food does not taste good and she has no appetite she says. However her weight is stable. Stools  are very variable, occasionally light-colored. She does have some heartburn. She has stress urinary incontinence. She feels anxious but not depressed. A detailed review of  systems today was otherwise noncontributory  PAST MEDICAL HISTORY: Past Medical History  Diagnosis Date  . Cancer (HCC)     RBreast, Lower back , Under l Arm  . Hypertension   . GERD (gastroesophageal reflux disease)   . Breast cancer metastasized to bone ([place]) 04/30/2011  . Radiation 01/15/2012    Lumbosacral spine 3500 cGy 14  fx  . Swollen R ankle 08/10/2012  . Fracture of ankle, medial malleolus, right, closed 08/10/2012  . Tubular adenoma 2011  . Hiatal hernia 2011  . Schatzki's ring   . Anorexia 03/02/2013  . Weight loss due to medication 03/02/2013  . C. difficile colitis   . Pneumonia     PAST SURGICAL HISTORY: Past Surgical History  Procedure Laterality Date  . Gallbladder surgery  1991  . Mastectomy  06/28/10    bilateral- Dr. Rosebud Poles, [place]  . Inguinal hernia repair  90's    right  . Parathyroidectomy  90's  . Carpal tunnel release  90's  . Oophorectomy  90's    right  . Lymph node dissection    . Breast surgery    . Cholecystectomy    . Colonoscopy  01/08/10    Dr. [name]- anal tag,hemorrhoid o/w normal rectum, pancolonic diverticula, diminutive polyp in the base of the cecum= tubular adenoma on bx. poor prep  . Esophagogastroduodenoscopy  01/08/10    Dr. [name]- normal esophagus, small hiatal hernia, antum and body erosions, pedunculated polyp between D1 and D2, duodenal diverticulum, lymphangiectasia, second portion of the duodenum.stomach bx= inflammation, duodenum bx= adenoma  . Rotator cuff repair  08/27/11    Dr. Alphonzo Cruise- [facility]- [place] [facility]  . Esophagogastroduodenoscopy (egd) with propofol N/A 11/19/2012    RMR: non-critical Schatzki's ring s/p dilation with 23 F, multiple gastric polyps, duodenal polyp s/p piecemeal snare polypectomy, gastric polypectomy, path benign  . Maloney dilation N/A 11/19/2012    Procedure: Venia Minks DILATION;  Surgeon: Daneil Dolin, MD;  Location: [facility];  Service: Endoscopy;  Laterality: N/A;  #[id number]  . Polypectomy N/A 11/19/2012     Procedure: POLYPECTOMY;  Surgeon: Daneil Dolin, MD;  Location: [facility];  Service: Endoscopy;  Laterality: N/A;  duodenal  . Esophageal biopsy N/A 11/19/2012    Procedure: BIOPSY;  Surgeon: Daneil Dolin, MD;  Location: [facility];  Service: Endoscopy;  Laterality: N/A;  esophageal    FAMILY HISTORY Family History  Problem Relation Age of Onset  . Cancer Mother     Colon  . Cancer Father     Lung, Mouth,leg   the patient's father died at the age of 43 from metastatic head and neck carcinoma. The patient's mother died of breast cancer the age of 65. The patient had 2 brothers, both with prostate cancer. She has 4 sisters, one diagnosed with breast cancer at the age of 66, another 1 recent underwent bilateral mastectomies. Another one died from colon cancer.   GYNECOLOGIC HISTORY:   menarche age 59, she is GX P0. She underwent menopause at age 32, status post HRT for 12 years. She status post right salpingo-oophorectomy.   SOCIAL HISTORY:  She works at [employer] in [place]. Her husband died at [facility] [place] from complications of surgery after a 7-1/2 month hospitalization) 2008). She lives by herself, with no pets. She tells me she works 9 hours a day  5 days a week.     ADVANCED DIRECTIVES: In place   HEALTH MAINTENANCE: Social History  Substance Use Topics  . Smoking status: Former Smoker    Quit date: 11/22/1982  . Smokeless tobacco: Never Used     Comment: FQuit smoking x 32 years  . Alcohol Use: No     Colonoscopy:  PAP:  Bone density:  Lipid panel:  Allergies  Allergen Reactions  . Aspirin   . Percodan [Oxycodone-Aspirin] Nausea Only  . Codeine Nausea Only    Current Outpatient Prescriptions  Medication Sig Dispense Refill  . acidophilus (RISAQUAD) CAPS capsule Take 1 capsule by mouth daily.    . cholecalciferol (VITAMIN D) 1000 UNITS tablet Take 2,000 Units by mouth daily.    . cyclobenzaprine (FLEXERIL) 10 MG tablet Take 1 tablet (10 mg total) by  mouth 2 (two) times daily as needed for muscle spasms. 20 tablet 0  . feeding supplement (BOOST / RESOURCE BREEZE) LIQD Take 1 Container by mouth 2 (two) times daily between meals.  0  . fluticasone (FLONASE) 50 MCG/ACT nasal spray Place 2 sprays into both nostrils daily as needed for allergies or rhinitis.   1  . HYDROcodone-acetaminophen (NORCO) 10-325 MG tablet Take 1-2 tablets by mouth every 4 (four) hours as needed for moderate pain or severe pain. 360 tablet 0  . hyoscyamine (LEVSIN/SL) 0.125 MG SL tablet Place 1 tablet (0.125 mg total) under the tongue 4 (four) times daily -  before meals and at bedtime. Prn diarrhea and abdominal cramping. 60 tablet 3  . letrozole (FEMARA) 2.5 MG tablet Take 1 tablet (2.5 mg total) by mouth daily. 90 tablet 4  . lisinopril (PRINIVIL,ZESTRIL) 20 MG tablet Take 10 mg by mouth daily.  3  . mometasone (ELOCON) 0.1 % lotion apply topically daily as needed for itching  5  . ondansetron (ZOFRAN-ODT) 8 MG disintegrating tablet DISSOLVE 1 TABLET ON THE TONGUE EVERY 8 HOURS AS NEEDED FOR NAUSEA 20 tablet 4  . palbociclib (IBRANCE) 125 MG capsule Take 1 capsule (125 mg total) by mouth daily. Take 21days out of 28 with Letrozole, with food 21 capsule 3  . Polyethyl Glycol-Propyl Glycol (SYSTANE OP) Apply 1 drop to eye daily as needed (dry eyes).    . vancomycin (VANCOCIN HCL) 125 MG capsule Take 1 capsule (125 mg total) by mouth 4 (four) times daily. (Patient not taking: Reported on 04/30/2015) 56 capsule 0  . warfarin (COUMADIN) 4 MG tablet Take 1 tablet (4 mg total) by mouth daily. (Patient taking differently: Take 2-4 mg by mouth See admin instructions. Take 2 mg on Monday, Wednesday and Friday.  Take 4 mg all other days.) 90 tablet 4   No current facility-administered medications for this visit.    OBJECTIVE: Middle-aged white woman in no acute distress Filed Vitals:   05/11/15 1303  BP: 150/71  Pulse: 71  Temp: 98 F (36.7 C)  Resp: 17     Body mass index is  23.25 kg/(m^2).    ECOG FS:1 - Symptomatic but completely ambulatory Filed Vitals:   05/11/15 1303  BP: 150/71  Pulse: 71  Temp: 98 F (36.7 C)  Resp: 17   Sclerae unicteric, pupils round and equal Oropharynx clear and moist-- no thrush or other lesions No cervical or supraclavicular adenopathy Lungs no rales or rhonchi Heart regular rate and rhythm Abd soft, nontender, positive bowel sounds MSK  Mild scoliosis butno focal spinal tenderness, no upper extremity lymphedema Neuro: nonfocal, well oriented, feisty affect  Breasts:  Status post bilateral mastectomies and radiation. There is no evidence of chest wall recurrence. Both axillae are benign.   LAB RESULTS: Outside labs reviewed.No components found for: LABCA125 CBC    Component Value Date/Time   WBC 2.9* 05/11/2015 1243   WBC 2.6* 02/16/2015 1431   RBC 3.20* 05/11/2015 1243   RBC 3.01* 02/16/2015 1431   HGB 10.9* 05/11/2015 1243   HGB 10.5* 02/16/2015 1431   HCT 32.5* 05/11/2015 1243   HCT 31.2* 02/16/2015 1431   PLT 227 05/11/2015 1243   PLT 139* 02/16/2015 1431   MCV 101.6* 05/11/2015 1243   MCV 103.7* 02/16/2015 1431   MCH 34.1* 05/11/2015 1243   MCH 34.9* 02/16/2015 1431   MCHC 33.5 05/11/2015 1243   MCHC 33.7 02/16/2015 1431   RDW 17.2* 05/11/2015 1243   RDW 14.9 02/16/2015 1431   LYMPHSABS 0.3* 05/11/2015 1243   LYMPHSABS 0.3* 02/16/2015 1431   MONOABS 0.3 05/11/2015 1243   MONOABS 0.2 02/16/2015 1431   EOSABS 0.0 05/11/2015 1243   EOSABS 0.1 02/16/2015 1431   BASOSABS 0.1 05/11/2015 1243   BASOSABS 0.0 02/16/2015 1431      Recent Labs Lab 05/11/15 1243  INR 1.70*    Urinalysis    Component Value Date/Time   COLORURINE YELLOW 01/23/2015 1830   APPEARANCEUR CLEAR 01/23/2015 1830   LABSPEC 1.010 01/23/2015 1830   PHURINE 6.0 01/23/2015 1830   GLUCOSEU NEGATIVE 01/23/2015 1830   HGBUR LARGE* 01/23/2015 1830   BILIRUBINUR NEGATIVE 01/23/2015 [place] 01/23/2015 1830    PROTEINUR TRACE* 01/23/2015 1830   UROBILINOGEN 0.2 01/23/2015 1830   NITRITE NEGATIVE 01/23/2015 1830   LEUKOCYTESUR NEGATIVE 01/23/2015 1830    STUDIES: Dg Chest 2 View  04/30/2015  CLINICAL DATA:  Initial encounter for lower left thoracic pain that started last night. EXAM: CHEST  2 VIEW COMPARISON:  12/14/2014 FINDINGS: Lungs are hyperexpanded with underlying chronic interstitial changes. Surgical clips overlie the axillary regions bilaterally. No focal airspace consolidation or pulmonary edema. Biapical pleural-parenchymal opacity is stable, compatible with scarring. The cardiopericardial silhouette is within normal limits for size. The visualized bony structures of the thorax are intact. IMPRESSION: No acute cardiopulmonary findings. Electronically Signed   By: Misty Coronado M.D.   On: 04/30/2015 13:37   Dg Thoracic Spine 2 View  04/30/2015  CLINICAL DATA:  Initial encounter for Left back pain at T-10 level, spasm and pain , no injury EXAM: THORACIC SPINE 2 VIEWS COMPARISON:  Chest radiograph of 04/30/2015. FINDINGS: Surgical clips which project over the right side of the chest. Surgical changes also project over the lower left hemi thorax. Mild convex right thoracic spine curvature. Frontal radiograph demonstrates normal paraspinous contours. The lateral view images from approximately the top of T3 through the bottom of L2. Maintenance of vertebral body height and alignment across these levels. Advanced lower thoracic spondylosis, with endplate osteophytes. No focal osseous lesion. Swimmer's view to does not well evaluate the cervical thoracic junction. IMPRESSION: Suboptimal evaluation of the upper thoracic spine. Lower thoracic spondylosis, without acute finding from T3 inferiorly. Electronically Signed   By: Abigail Miyamoto M.D.   On: 04/30/2015 15:21    ASSESSMENT: 70 y.o. [place], [place] woman status post right lumpectomy and axillary lymph node dissection December 2006 for a [place] N2, stage IIIA  invasive ductal carcinoma, estrogen and progesterone receptor positive, with subsequent stage IV disease  (1) received adjuvant doxorubicin, docetaxel and cyclophosphamide  (2) status post adjuvant radiation  (3) on tamoxifen  to September 2011 when she was noted to have metastatic disease to bone  (4) fulvestrant 02/06/2009 to 01/15/2011  (5) left axillary lymph node positive for an invasive ductal carcinoma, estrogen receptor and HER-2 negative November 2011, and a right breast recurrence for an estrogen receptor positive lesion at the same time.  (6) status post bilateral mastectomies 06/28/2010  (7) status post left chest and axillary radiation  (8) status post right chest wall recurrence October of 2012, treated with electron beam radiation completed December 2012  (9) progression in bone August 2013,  (10) status post radiation to the lumbosacral spine completed October 2013   (11) capecitabine November 2013 through January 2014  (12) tamoxifen/ everolimus February 2014, discontinued November 2014 with progression  (13) Megace 80 mg twice daily with progression January 2015  (14) Radiation to lumbar spine February 2015  (15) letrozole plus Ibrance started 07/08/2013  (16). Declines genetic testing (as of 08/17/2013)  (17) zolendronate 05/01/2009, discontinued because of pain; Xgeva started 10/08/2013, discontinued because of cost issues as of November 2015, switched to alendronate March 2016, switched back to [place] but stopped after the May 2016 dose because of side effects  OTHER ACTIVE PROBLEMS:  (1) chronic GI complaints, followed by Dr. Sydell Axon  (a) treated for c difficile colitis Sept 2016 (stool Ag positive, toxin negative 01/23/2015), received oral vancomycin started 01/24/2015, stopped 02/07/2015  (2) multiple orthopedic problems, including bilateral chronic foot pain, history of rotator cuff repair  (3) history of extensive superficial phlebitis, on chronic low  dose Coumadin.  (4) LUL cavitary pneumonia diagnosed as MRSA while at [facility] April 2016, treated with Zosyn x 21 days  PLAN: Jenaya continues to do remarkably well and clinically I don't think any of her problems are explained by her metastatic breast cancer.  Her chronic pain problems are also well-controlled on a stable dose of narcotics.  I am concerned that she has not been on Denosumab or a bisphosphonate since May. She had some side effects to the May Denosumab shot which I think possibly were unrelated. In any case she is unwilling to go back on that medication.  She did okay with alendronate except for the fact that she heated taking it weekly. I have started her on ibandronate. She will take on the first day of each month. I gave her written instructions reminding her on how to take those medications to prevent reflux problems   she is going to return to see me again in 2 months for exam and labs and then 4 months from now she will have a restaging PET scan. Otherwise the plan is to continue the current treatment until there is evidence of disease progression Leocadia Idleman C, MD   05/11/2015 1:15 PM

## 2015-05-11 NOTE — Telephone Encounter (Signed)
Gv pt appts for 3/1 + 5/10. Advised c-sched will call for Pet scan.

## 2015-05-23 ENCOUNTER — Encounter: Payer: Self-pay | Admitting: Internal Medicine

## 2015-05-23 ENCOUNTER — Encounter: Payer: Self-pay | Admitting: Oncology

## 2015-05-23 NOTE — Progress Notes (Signed)
I placed pfzer forms for dr. Ron Agee for ibrance asst

## 2015-05-25 ENCOUNTER — Encounter: Payer: Self-pay | Admitting: Oncology

## 2015-05-25 ENCOUNTER — Telehealth: Payer: Self-pay | Admitting: *Deleted

## 2015-05-25 NOTE — Progress Notes (Signed)
Patient came in to sign apps for ibrance and letrozole . I made copy for and faxed both to [facility] for her.

## 2015-05-30 NOTE — Telephone Encounter (Signed)
NO ENTRY 

## 2015-06-03 ENCOUNTER — Other Ambulatory Visit: Payer: Self-pay | Admitting: Internal Medicine

## 2015-06-09 ENCOUNTER — Encounter: Payer: Self-pay | Admitting: Oncology

## 2015-06-09 NOTE — Progress Notes (Signed)
refaxed forms to [phone] and [employer] [id number] for ibrance and letozole asst.

## 2015-06-12 ENCOUNTER — Other Ambulatory Visit: Payer: Self-pay

## 2015-06-12 DIAGNOSIS — C50919 Malignant neoplasm of unspecified site of unspecified female breast: Secondary | ICD-10-CM

## 2015-06-12 DIAGNOSIS — C7951 Secondary malignant neoplasm of bone: Principal | ICD-10-CM

## 2015-06-13 ENCOUNTER — Other Ambulatory Visit: Payer: Self-pay

## 2015-06-13 ENCOUNTER — Encounter: Payer: Self-pay | Admitting: Oncology

## 2015-06-13 DIAGNOSIS — C7951 Secondary malignant neoplasm of bone: Principal | ICD-10-CM

## 2015-06-13 DIAGNOSIS — C50919 Malignant neoplasm of unspecified site of unspecified female breast: Secondary | ICD-10-CM

## 2015-06-13 MED ORDER — PALBOCICLIB 125 MG PO CAPS: 125.0000 mg | ORAL_CAPSULE | Freq: Every day | ORAL | Status: DC

## 2015-06-13 NOTE — Progress Notes (Signed)
Per [employer] letrozole will be shipped 06/15/15 copay 3.08. I sent to medical records

## 2015-06-13 NOTE — Telephone Encounter (Signed)
Merry Proud from [employer] called stating that patient was approved for assistance with her [name].  Merry Proud is asking for a prescription and cover sheet- faxed to [phone].  Writer faxed the requested information and received a transmission log back-ok.  [employer] to ship medication directly to the patient. LVM for patient to let her know that the medication was approved for assistance with [facility] and to call with questions or when the medication arrives.

## 2015-06-16 ENCOUNTER — Encounter: Payer: Self-pay | Admitting: Oncology

## 2015-06-16 NOTE — Progress Notes (Signed)
Per [employer] is approved until 04/28/16. Sent to medical records

## 2015-06-20 ENCOUNTER — Ambulatory Visit (INDEPENDENT_AMBULATORY_CARE_PROVIDER_SITE_OTHER): Payer: Medicare PPO | Admitting: Internal Medicine

## 2015-06-20 ENCOUNTER — Encounter: Payer: Self-pay | Admitting: Internal Medicine

## 2015-06-20 VITALS — BP 106/62 | HR 80 | Temp 98.4°F | Ht 62.0 in | Wt 134.0 lb

## 2015-06-20 DIAGNOSIS — R194 Change in bowel habit: Secondary | ICD-10-CM | POA: Diagnosis not present

## 2015-06-20 DIAGNOSIS — Z8619 Personal history of other infectious and parasitic diseases: Secondary | ICD-10-CM | POA: Diagnosis not present

## 2015-06-20 NOTE — Patient Instructions (Signed)
Continue Anaspaz before meals and a bedtime - up to 4 x daily  Once I have spoken to your oncologist, I'll be back in touch regarding recommendations on x-rays  Further recommendations to follow

## 2015-06-20 NOTE — Progress Notes (Signed)
Primary Care Physician:  Curlene Labrum, MD Primary Gastroenterologist:  Dr. [name]  Pre-Procedure History & Physical: HPI:  [name] is a 70 y.o. female here for follow-up of metastatic breast cancer.    History of known bone metastasis. She has lost 6 pounds since her last visit. She denies nausea or vomiting or reflux symptoms; no odynophagia or dysphagia. Has loose stools intermittently - about 3 times weekly relatively normal bowel function at other times. She's had occasional bouts of nocturnal incontinence. She does not take a nighttime dose of Anaspaz although Naoma Diener is felt to have helped tremendously at other times. She also takes Flexeril for back "spasms". C. Difficile infection within the past year.    Patient now relates a week and a half history of new lower back pain radiating into her left pelvis. She states she has numbness and tingling in her left leg from time to time. She denies weakness. Does not have any urinary tract symptoms. 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 She continues on Boniva for osteoporosis. Past Medical History  Diagnosis Date  . Cancer (HCC)     RBreast, Lower back , Under l Arm  . Hypertension   . GERD (gastroesophageal reflux disease)   . Breast cancer metastasized to bone ([facility]) 04/30/2011  . Radiation 01/15/2012    Lumbosacral spine 3500 cGy 14  fx  . Swollen R ankle 08/10/2012  . Fracture of ankle, medial malleolus, right, closed 08/10/2012  . Tubular adenoma 2011  . Hiatal hernia 2011  . Schatzki's ring   . Anorexia 03/02/2013  . Weight loss due to medication 03/02/2013  . C. difficile colitis   . Pneumonia     Past Surgical History  Procedure Laterality Date  . Gallbladder surgery  1991  . Mastectomy  06/28/10    bilateral- Dr. Rosebud Poles, [place]  . Inguinal hernia repair  90's    right  . Parathyroidectomy  90's  . Carpal tunnel release  90's  . Oophorectomy  90's    right  . Lymph node dissection    .  Breast surgery    . Cholecystectomy    . Colonoscopy  01/08/10    Dr. [name]- anal tag,hemorrhoid o/w normal rectum, pancolonic diverticula, diminutive polyp in the base of the cecum= tubular adenoma on bx. poor prep  . Esophagogastroduodenoscopy  01/08/10    Dr. [name]- normal esophagus, small hiatal hernia, antum and body erosions, pedunculated polyp between D1 and D2, duodenal diverticulum, lymphangiectasia, second portion of the duodenum.stomach bx= inflammation, duodenum bx= adenoma  . Rotator cuff repair  08/27/11    Dr. Alphonzo Cruise- [facility]- [facility] [facility]  . Esophagogastroduodenoscopy (egd) with propofol N/A 11/19/2012    RMR: non-critical Schatzki's ring s/p dilation with 77 F, multiple gastric polyps, duodenal polyp s/p piecemeal snare polypectomy, gastric polypectomy, path benign  . Maloney dilation N/A 11/19/2012    Procedure: Venia Minks DILATION;  Surgeon: Daneil Dolin, MD;  Location: [facility];  Service: Endoscopy;  Laterality: N/A;  #54  . Polypectomy N/A 11/19/2012    Procedure: POLYPECTOMY;  Surgeon: Daneil Dolin, MD;  Location: [facility];  Service: Endoscopy;  Laterality: N/A;  duodenal  . Biopsy N/A 11/19/2012    Procedure: BIOPSY;  Surgeon: Daneil Dolin, MD;  Location: [facility];  Service: Endoscopy;  Laterality: N/A;  esophageal    Prior to Admission medications   Medication Sig Start Date End Date Taking? Authorizing Provider  acidophilus (RISAQUAD) CAPS capsule Take 1 capsule by  mouth daily.    Historical Provider, MD  cholecalciferol (VITAMIN D) 1000 UNITS tablet Take 2,000 Units by mouth daily.    Historical Provider, MD  cyclobenzaprine (FLEXERIL) 10 MG tablet Take 1 tablet (10 mg total) by mouth 2 (two) times daily as needed for muscle spasms. 04/30/15   Nat Christen, MD  feeding supplement (BOOST / RESOURCE BREEZE) LIQD Take 1 Container by mouth 2 (two) times daily between meals. 01/28/15   Samuella Cota, MD  fluticasone [facility]) 50 MCG/ACT nasal spray Place 2 sprays into both nostrils daily as  needed for allergies or rhinitis.  12/08/14   Historical Provider, MD  HYDROcodone-acetaminophen (NORCO) 10-325 MG tablet Take 1-2 tablets by mouth every 4 (four) hours as needed for moderate pain or severe pain. 05/11/15   Chauncey Cruel, MD  hyoscyamine (ANASPAZ) 0.125 MG TBDP disintergrating tablet DISSOLVE 1 TABLET(0.125 MG) UNDER THE TONGUE FOUR TIMES DAILY BEFORE MEALS AND AT BEDTIME AS NEEDED FOR DIARRHEA OR ABDOMINAL CRAMPS 06/06/15   Orvil Feil, NP  ibandronate (BONIVA) 150 MG tablet Take 1 tablet (150 mg total) by mouth every 30 (thirty) days. Take in the morning with a full glass of water, on an empty stomach, and do not take anything else by mouth or lie down for the next 30 min. 05/11/15   Chauncey Cruel, MD  letrozole [facility]) 2.5 MG tablet Take 1 tablet (2.5 mg total) by mouth daily. 05/11/15   Chauncey Cruel, MD  lisinopril (PRINIVIL,ZESTRIL) 20 MG tablet Take 10 mg by mouth daily. 06/20/14   Historical Provider, MD  mometasone (ELOCON) 0.1 % lotion apply topically daily as needed for itching 10/17/14   Historical Provider, MD  ondansetron (ZOFRAN-ODT) 8 MG disintegrating tablet DISSOLVE 1 TABLET ON THE TONGUE EVERY 8 HOURS AS NEEDED FOR NAUSEA 02/09/15   Chauncey Cruel, MD  palbociclib [facility]) 125 MG capsule Take 1 capsule (125 mg total) by mouth daily. Take 21days out of 28 with Letrozole, with food 06/13/15   Chauncey Cruel, MD  Polyethyl Glycol-Propyl Glycol (SYSTANE OP) Apply 1 drop to eye daily as needed (dry eyes).    Historical Provider, MD  warfarin (COUMADIN) 4 MG tablet Take 1 tablet (4 mg total) by mouth daily. Patient taking differently: Take 2-4 mg by mouth See admin instructions. Take 2 mg on Monday, Wednesday and Friday.  Take 4 mg all other days. 02/28/15   Chauncey Cruel, MD    Allergies as of 06/20/2015 - Review Complete 06/20/2015  Allergen Reaction Noted  . Aspirin  11/13/2012  . Percodan [oxycodone-aspirin] Nausea Only   . Codeine Nausea Only      Family History  Problem Relation Age of Onset  . Cancer Mother     Colon  . Cancer Father     Lung, Mouth,leg    Social History   Social History  . Marital Status: Widowed    Spouse Name: N/A  . Number of Children: N/A  . Years of Education: N/A   Occupational History  . Not on file.   Social History Main Topics  . Smoking status: Former Smoker    Quit date: 11/22/1982  . Smokeless tobacco: Never Used     Comment: Quit smoking x 32 years  . Alcohol Use: No  . Drug Use: No  . Sexual Activity: Not Currently   Other Topics Concern  . Not on file   Social History Narrative    Review of Systems: See HPI, otherwise negative ROS  Physical Exam: BP 106/62 mmHg  Pulse 80  Temp(Src) 98.4 F (36.9 C) (Oral)  Ht 5\' 2"  (1.575 m)  Wt 134 lb (60.782 kg)  BMI 24.50 kg/m2 General:   Alert, well groomed chronically ill-appearing, pleasant and cooperative in NAD Skin:  Intact without significant lesions or rashes. Eyes:  Sclera clear, no icterus.   Conjunctiva pink. Lungs:  Clear throughout to auscultation.   No wheezes, crackles, or rhonchi. No acute distress. Heart:  Regular rate and rhythm; no murmurs, clicks, rubs,  or gallops. Abdomen: Non-distended, normal bowel sounds.  Soft and nontender without appreciable mass or hepatosplenomegaly.  Pulses:  Normal pulses noted. Extremities:  Without clubbing or edema.  She is able to lift her right leg better than her left leg Back:   No deformity no particular focal tenderness to percussion over the spine.  No CVA tenderness.   Impression:  Unfortunate 70 year old lady with metastatic breast cancer to bone on oral chemotherapy now a week and half history of left-sided back pain radiating interim left lower quadrant. She notes some tingling and numbness in her leg from time to time. I really don't get any history of any urinary tract symptoms. Diarrhea his improved dramatically. He is only intermittent in nature now.  Likely due  to recurrent Clostridium difficile infection.  She has imaging discuss scheduled in the future with a bone scan and he had scan. I feel that she needs imaging more urgently. Even perhaps an MRI of the spine.                           Recommendations:     Continue Anaspaz before meals and a bedtime - up to 4 x daily  Once I have spoken to the oncologist, I'll be back in touch regarding recommendations on x-rays  Further recommendations to follow     Notice: This dictation was prepared with Dragon dictation along with smaller phrase technology. Any transcriptional errors that result from this process are unintentional and may not be corrected upon review.

## 2015-06-21 ENCOUNTER — Telehealth: Payer: Self-pay | Admitting: Internal Medicine

## 2015-06-21 ENCOUNTER — Other Ambulatory Visit: Payer: Self-pay | Admitting: Oncology

## 2015-06-21 ENCOUNTER — Other Ambulatory Visit: Payer: Self-pay

## 2015-06-21 DIAGNOSIS — M545 Low back pain: Secondary | ICD-10-CM

## 2015-06-21 NOTE — Telephone Encounter (Signed)
Per [facility] online MR lumbar spine is still pending Case # [id number]

## 2015-06-21 NOTE — Telephone Encounter (Signed)
Discussed new back symptoms with Dr. [name] via telephone this morning. He states patient has really been doing remarkably well from a standpoint of her cancer lately. He fully agrees with proceeding with an MRI to further evaluate symptoms. Hopefully, just a degenerative degenerative disc issue, etc. not something else. Please order an MRI of the thoracic and lumbar spine.

## 2015-06-21 NOTE — Telephone Encounter (Signed)
Routing to the clinical pool to be scheduled.

## 2015-06-21 NOTE — Progress Notes (Unsigned)
I was called today by Dr. Verlene Mayer. He tells me from a GI point of view she returns doing fine but for about 10 days she's had worsening pain in her lower back radiating to her left pelvis and a little bit down her left leg. He wanted to know how best to work this and I suggested an MRI. He will keep me posted on results.

## 2015-06-21 NOTE — Telephone Encounter (Signed)
Faxed clinicals to [facility]

## 2015-06-21 NOTE — Telephone Encounter (Signed)
Per [facility] MR for chest is approved ([id number])

## 2015-06-22 ENCOUNTER — Telehealth: Payer: Self-pay | Admitting: Internal Medicine

## 2015-06-22 MED ORDER — CYCLOBENZAPRINE HCL 10 MG PO TABS: 10.0000 mg | ORAL_TABLET | Freq: Two times a day (BID) | ORAL | Status: DC | PRN

## 2015-06-22 NOTE — Telephone Encounter (Signed)
MRI lumbar/spine and MRI Chest approved per [facility] PA # is [id number]. PA is for both procedures  Called pt and [facility] regarding appointment on 07/05/2015 @ 2:00pm

## 2015-06-22 NOTE — Telephone Encounter (Signed)
Routing to RMR. 

## 2015-06-22 NOTE — Telephone Encounter (Signed)
Please call patient, [phone] work number until 3.  Was given flexaril in the hospital and wants to know if rmr can call her in some until she can get the MRI done and find out what is going on with her back.

## 2015-06-22 NOTE — Addendum Note (Signed)
Addended by: Claudina Lick on: 06/22/2015 02:09 PM   Modules accepted: Orders

## 2015-06-22 NOTE — Telephone Encounter (Signed)
Okay; may call in prescription for Flexeril 10 mg tablets. Directions are  (1) -10 mg tablet twice daily as needed. May dispense 60 with one refill

## 2015-06-22 NOTE — Telephone Encounter (Signed)
rx has been sent in. Pt is aware.  

## 2015-06-22 NOTE — Telephone Encounter (Signed)
Insurance is still pending 

## 2015-06-28 ENCOUNTER — Ambulatory Visit (HOSPITAL_BASED_OUTPATIENT_CLINIC_OR_DEPARTMENT_OTHER): Payer: Medicare PPO | Admitting: Oncology

## 2015-06-28 ENCOUNTER — Other Ambulatory Visit (HOSPITAL_BASED_OUTPATIENT_CLINIC_OR_DEPARTMENT_OTHER): Payer: Medicare PPO

## 2015-06-28 ENCOUNTER — Ambulatory Visit (HOSPITAL_COMMUNITY)
Admission: RE | Admit: 2015-06-28 | Discharge: 2015-06-28 | Disposition: A | Discharge status: Home / Self Care | Payer: Medicare PPO | Source: Ambulatory Visit | Attending: Oncology | Admitting: Oncology

## 2015-06-28 VITALS — BP 145/89 | HR 75 | Temp 98.1°F | Resp 18 | Ht 62.0 in | Wt 131.2 lb

## 2015-06-28 DIAGNOSIS — C44501 Unspecified malignant neoplasm of skin of breast: Secondary | ICD-10-CM | POA: Diagnosis not present

## 2015-06-28 DIAGNOSIS — M438X4 Other specified deforming dorsopathies, thoracic region: Secondary | ICD-10-CM | POA: Insufficient documentation

## 2015-06-28 DIAGNOSIS — M5442 Lumbago with sciatica, left side: Secondary | ICD-10-CM

## 2015-06-28 DIAGNOSIS — S8251XA Displaced fracture of medial malleolus of right tibia, initial encounter for closed fracture: Secondary | ICD-10-CM

## 2015-06-28 DIAGNOSIS — M5441 Lumbago with sciatica, right side: Secondary | ICD-10-CM

## 2015-06-28 DIAGNOSIS — C7951 Secondary malignant neoplasm of bone: Secondary | ICD-10-CM

## 2015-06-28 DIAGNOSIS — M545 Low back pain: Secondary | ICD-10-CM | POA: Diagnosis not present

## 2015-06-28 DIAGNOSIS — C50912 Malignant neoplasm of unspecified site of left female breast: Secondary | ICD-10-CM

## 2015-06-28 DIAGNOSIS — C50911 Malignant neoplasm of unspecified site of right female breast: Secondary | ICD-10-CM

## 2015-06-28 DIAGNOSIS — N393 Stress incontinence (female) (male): Secondary | ICD-10-CM

## 2015-06-28 DIAGNOSIS — M5136 Other intervertebral disc degeneration, lumbar region: Secondary | ICD-10-CM

## 2015-06-28 DIAGNOSIS — C50811 Malignant neoplasm of overlapping sites of right female breast: Secondary | ICD-10-CM

## 2015-06-28 DIAGNOSIS — C773 Secondary and unspecified malignant neoplasm of axilla and upper limb lymph nodes: Secondary | ICD-10-CM | POA: Diagnosis not present

## 2015-06-28 DIAGNOSIS — C50919 Malignant neoplasm of unspecified site of unspecified female breast: Secondary | ICD-10-CM

## 2015-06-28 DIAGNOSIS — M47814 Spondylosis without myelopathy or radiculopathy, thoracic region: Secondary | ICD-10-CM | POA: Insufficient documentation

## 2015-06-28 DIAGNOSIS — G893 Neoplasm related pain (acute) (chronic): Secondary | ICD-10-CM

## 2015-06-28 DIAGNOSIS — M5134 Other intervertebral disc degeneration, thoracic region: Secondary | ICD-10-CM

## 2015-06-28 DIAGNOSIS — Z86711 Personal history of pulmonary embolism: Secondary | ICD-10-CM

## 2015-06-28 DIAGNOSIS — J984 Other disorders of lung: Secondary | ICD-10-CM

## 2015-06-28 DIAGNOSIS — Z8672 Personal history of thrombophlebitis: Secondary | ICD-10-CM

## 2015-06-28 LAB — CBC WITH DIFFERENTIAL/PLATELET
BASO%: 2.6 % — AB (ref 0.0–2.0)
Basophils Absolute: 0.1 10*3/uL (ref 0.0–0.1)
EOS%: 0.6 % (ref 0.0–7.0)
Eosinophils Absolute: 0 10*3/uL (ref 0.0–0.5)
HCT: 32.6 % — ABNORMAL LOW (ref 34.8–46.6)
HGB: 11 g/dL — ABNORMAL LOW (ref 11.6–15.9)
LYMPH%: 10.1 % — AB (ref 14.0–49.7)
MCH: 34.4 pg — ABNORMAL HIGH (ref 25.1–34.0)
MCHC: 33.7 g/dL (ref 31.5–36.0)
MCV: 102 fL — AB (ref 79.5–101.0)
MONO#: 0.3 10*3/uL (ref 0.1–0.9)
MONO%: 8.8 % (ref 0.0–14.0)
NEUT%: 77.9 % — AB (ref 38.4–76.8)
NEUTROS ABS: 2.5 10*3/uL (ref 1.5–6.5)
Platelets: 369 10*3/uL (ref 145–400)
RBC: 3.19 10*6/uL — AB (ref 3.70–5.45)
RDW: 16.7 % — ABNORMAL HIGH (ref 11.2–14.5)
WBC: 3.2 10*3/uL — AB (ref 3.9–10.3)
lymph#: 0.3 10*3/uL — ABNORMAL LOW (ref 0.9–3.3)

## 2015-06-28 LAB — COMPREHENSIVE METABOLIC PANEL
ALT: 14 U/L (ref 0–55)
ANION GAP: 9 meq/L (ref 3–11)
AST: 22 U/L (ref 5–34)
Albumin: 3.4 g/dL — ABNORMAL LOW (ref 3.5–5.0)
Alkaline Phosphatase: 134 U/L (ref 40–150)
BUN: 17 mg/dL (ref 7.0–26.0)
CHLORIDE: 104 meq/L (ref 98–109)
CO2: 28 meq/L (ref 22–29)
Calcium: 9.4 mg/dL (ref 8.4–10.4)
Creatinine: 0.9 mg/dL (ref 0.6–1.1)
EGFR: 66 mL/min/{1.73_m2} — AB (ref >90)
Glucose: 94 mg/dl (ref 70–140)
POTASSIUM: 4.2 meq/L (ref 3.5–5.1)
SODIUM: 141 meq/L (ref 136–145)
Total Bilirubin: 0.44 mg/dL (ref 0.20–1.20)
Total Protein: 6.9 g/dL (ref 6.4–8.3)

## 2015-06-28 LAB — PROTIME-INR
INR: 4.2 — AB (ref 2.00–3.50)
Protime: 50.4 Seconds — ABNORMAL HIGH (ref 10.6–13.4)

## 2015-06-28 NOTE — Progress Notes (Signed)
[place]  [phone] [phone]     ID: [name] OB: [birth year]  MR#: [id number]  XTK#:[id number]  PCP: Curlene Labrum, MD GYN:  Gari Crown  SU:  OTHER MD: Gery Pray, Sherre Poot  CHIEF COMPLAINT: stage IV breast cancer  CURRENT TREATMENT: letrozole + palbocilcib [name]; denosumab  BREAST CANCER HISTORY: From Dr. Adria Dill summary 07/14/2013::  "Complex 70 year old woman with history of ER-positive and ER negative metachronous primary bilateral breast cancers.  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 Progression in bone while on tamoxifen September 2011. Initial complete response to monthly Faslodex injections.She developed a malignant left axillary lymph node with no clear primary in the left breast November 2011. This tumor was ER negative. HER-2 negative. While under evaluation for this lesion she developed a new lesion in the right breast which was ER positive. She underwent bilateral mastectomies 06/28/2010. She was continued on hormonal therapy with monthly Faslodex injections in view of the ER positive component of her tumor with a complete response to this hormone in bone. She underwent radiation to the left chest and axilla subsequent to the mastectomies. She developed a cutaneous recurrence on the right chest wall along the medial aspect of the right mastectomy scar, biopsy proven, initially detected on clinical exam by her radiation oncologist on 02/14/2011. She was treated with electron beam radiation.. 3000 cGy in 15 fractions through December 2012. Further progression in bone in August 2013. Palliative radiation to lumbosacral spine September through October 2013. Brief trial of Xeloda November 2013 through January 2014.  Change to tamoxifen plus [employer] February 2014 stopped for progression and 25 pound weight loss November 2014. Trial of Megace 80 mg twice a day with improved appetite but progressive rise in tumor marker and progressive low back pain with further progression on MRI of the spine done January 2015. Radiation to the lumbar spine February 2015. She started most recent salvage regimen with a combination of Ibrance plus Femara on 07/08/2013. It is too early to assess response."  The patient's breast cancer history is summarized in detailed below   INTERVAL HISTORY: I was called on November 22 by Dr. Sydell Axon told me that [name] was having significant back pain, radiating down the left leg. He is managing her diarrhea, which is greatly improved. He set her up for an MRI and I asked her to come in today to see me, thinking the MRI would have been done by now, but actually it could not be scheduled until 07/05/2015. She is here nevertheless today because of the continuing problems with pain. This is primarily lower back radiating down her left leg. It is worse with certain positions and movements. Currently she is taking hydrocodone 4 times a day, 2 tablets at a time, with occasional Tylenol in addition for this.  She is tolerating the letrozole and palbociclib without any new or worsening symptoms.  REVIEW OF SYSTEMS: [name] tells me her diarrhea is much better. Recall she had C. difficile while in a recent hospitalization. She says that that has resolved, but perhaps once a week she will have nighttime problems with bowels. She will have to get up 2 or 3 times and the bowel movements will be very. Aside from that she continues on warfarin, with no evidence of bleeding, although she bruises easily. She has night sweats. She feels  very tired, but even with a horrible pain she is having now she continues to work. She has blurred vision at times. She complains of palpitations. Her ankles swell. She sleeps on  multiple pillows. Her appetite is poor. She has some stress urinary incontinence. She feels weak and forgetful and anxious, but not depressed. A detailed review of systems today was otherwise stable  PAST MEDICAL HISTORY: Past Medical History  Diagnosis Date  . Cancer (HCC)     RBreast, Lower back , Under l Arm  . Hypertension   . GERD (gastroesophageal reflux disease)   . Breast cancer metastasized to bone ([place]) 04/30/2011  . Radiation 01/15/2012    Lumbosacral spine 3500 cGy 14  fx  . Swollen R ankle 08/10/2012  . Fracture of ankle, medial malleolus, right, closed 08/10/2012  . Tubular adenoma 2011  . Hiatal hernia 2011  . Schatzki's ring   . Anorexia 03/02/2013  . Weight loss due to medication 03/02/2013  . C. difficile colitis   . Pneumonia     PAST SURGICAL HISTORY: Past Surgical History  Procedure Laterality Date  . Gallbladder surgery  1991  . Mastectomy  06/28/10    bilateral- Dr. Rosebud Poles, [place]  . Inguinal hernia repair  90's    right  . Parathyroidectomy  90's  . Carpal tunnel release  90's  . Oophorectomy  90's    right  . Lymph node dissection    . Breast surgery    . Cholecystectomy    . Colonoscopy  01/08/10    Dr. [name]- anal tag,hemorrhoid o/w normal rectum, pancolonic diverticula, diminutive polyp in the base of the cecum= tubular adenoma on bx. poor prep  . Esophagogastroduodenoscopy  01/08/10    Dr. [name]- normal esophagus, small hiatal hernia, antum and body erosions, pedunculated polyp between D1 and D2, duodenal diverticulum, lymphangiectasia, second portion of the duodenum.stomach bx= inflammation, duodenum bx= adenoma  . Rotator cuff repair  08/27/11    Dr. Alphonzo Cruise- [facility]- [place] [facility]  . Esophagogastroduodenoscopy (egd) with propofol N/A 11/19/2012    RMR: non-critical Schatzki's ring s/p dilation with 15 F, multiple gastric polyps, duodenal polyp s/p piecemeal snare polypectomy, gastric polypectomy, path benign  . Maloney dilation N/A 11/19/2012     Procedure: Venia Minks DILATION;  Surgeon: Daneil Dolin, MD;  Location: [facility];  Service: Endoscopy;  Laterality: N/A;  #[id number]  . Polypectomy N/A 11/19/2012    Procedure: POLYPECTOMY;  Surgeon: Daneil Dolin, MD;  Location: [facility];  Service: Endoscopy;  Laterality: N/A;  duodenal  . Biopsy N/A 11/19/2012    Procedure: BIOPSY;  Surgeon: Daneil Dolin, MD;  Location: [facility];  Service: Endoscopy;  Laterality: N/A;  esophageal    FAMILY HISTORY Family History  Problem Relation Age of Onset  . Cancer Mother     Colon  . Cancer Father     Lung, Mouth,leg   the patient's father died at the age of 55 from metastatic head and neck carcinoma. The patient's mother died of breast cancer the age of 16. The patient had 2 brothers, both with prostate cancer. She has 4 sisters, one diagnosed with breast cancer at the age of 77, another 1 recent underwent bilateral mastectomies. Another one died from colon cancer.   GYNECOLOGIC HISTORY:   menarche age 21, she is GX P0. She underwent menopause at age 62, status post HRT for 12 years. She status post right salpingo-oophorectomy.   SOCIAL HISTORY:  She works at [employer] in [place].  Her husband died at [facility] [place] from complications of surgery after a 7-1/2 month hospitalization) 2008). She lives by herself, with no pets. She tells me she works 9 hours a day 5 days a week.     ADVANCED DIRECTIVES: In place   HEALTH MAINTENANCE: Social History  Substance Use Topics  . Smoking status: Former Smoker    Quit date: 11/22/1982  . Smokeless tobacco: Never Used     Comment: Quit smoking x 32 years  . Alcohol Use: No     Colonoscopy:  PAP:  Bone density:  Lipid panel:  Allergies  Allergen Reactions  . Aspirin   . Percodan [Oxycodone-Aspirin] Nausea Only  . Codeine Nausea Only    Current Outpatient Prescriptions  Medication Sig Dispense Refill  . acidophilus (RISAQUAD) CAPS capsule Take 1 capsule by mouth daily. Reported on  06/20/2015    . cholecalciferol (VITAMIN D) 1000 UNITS tablet Take 1,000 Units by mouth daily.     . cyclobenzaprine (FLEXERIL) 10 MG tablet Take 1 tablet (10 mg total) by mouth 2 (two) times daily as needed for muscle spasms. 20 tablet 0  . cyclobenzaprine (FLEXERIL) 10 MG tablet Take 1 tablet (10 mg total) by mouth 2 (two) times daily as needed for muscle spasms. 60 tablet 1  . feeding supplement (BOOST / RESOURCE BREEZE) LIQD Take 1 Container by mouth 2 (two) times daily between meals. (Patient taking differently: Take 1 Container by mouth 2 (two) times daily between meals. Drinks one when she can't eat)  0  . fluticasone (FLONASE) 50 MCG/ACT nasal spray Place 2 sprays into both nostrils daily as needed for allergies or rhinitis. Reported on 06/20/2015  1  . HYDROcodone-acetaminophen (NORCO) 10-325 MG tablet Take 1-2 tablets by mouth every 4 (four) hours as needed for moderate pain or severe pain. 360 tablet 0  . hyoscyamine (ANASPAZ) 0.125 MG TBDP disintergrating tablet DISSOLVE 1 TABLET(0.125 MG) UNDER THE TONGUE FOUR TIMES DAILY BEFORE MEALS AND AT BEDTIME AS NEEDED FOR DIARRHEA OR ABDOMINAL CRAMPS 360 tablet 3  . ibandronate (BONIVA) 150 MG tablet Take 1 tablet (150 mg total) by mouth every 30 (thirty) days. Take in the morning with a full glass of water, on an empty stomach, and do not take anything else by mouth or lie down for the next 30 min. (Patient not taking: Reported on 06/20/2015) 3 tablet 4  . letrozole (FEMARA) 2.5 MG tablet Take 1 tablet (2.5 mg total) by mouth daily. 90 tablet 4  . lisinopril (PRINIVIL,ZESTRIL) 20 MG tablet Take 10 mg by mouth daily.  3  . mometasone (ELOCON) 0.1 % lotion apply topically daily as needed for itching  5  . morphine (MSIR) 15 MG tablet Take 15 mg by mouth. Once every 12 hours as needed    . ondansetron (ZOFRAN-ODT) 8 MG disintegrating tablet DISSOLVE 1 TABLET ON THE TONGUE EVERY 8 HOURS AS NEEDED FOR NAUSEA 20 tablet 4  . palbociclib (IBRANCE) 125 MG  capsule Take 1 capsule (125 mg total) by mouth daily. Take 21days out of 28 with Letrozole, with food 21 capsule 3  . Polyethyl Glycol-Propyl Glycol (SYSTANE OP) Apply 1 drop to eye daily as needed (dry eyes).    . warfarin (COUMADIN) 4 MG tablet Take 1 tablet (4 mg total) by mouth daily. (Patient taking differently: Take 2-4 mg by mouth See admin instructions. Take 2 mg on Monday, Wednesday and Friday.  Take 4 mg all other days.) 90 tablet 4   No current facility-administered medications  for this visit.    OBJECTIVE: Middle-aged white woman who moves with difficulty secondary to back pain Filed Vitals:   06/28/15 0959  BP: 145/89  Pulse: 75  Temp: 98.1 F (36.7 C)  Resp: 18     Body mass index is 23.99 kg/(m^2).    ECOG FS:2 - Symptomatic, <50% confined to bed Filed Vitals:   06/28/15 0959  BP: 145/89  Pulse: 75  Temp: 98.1 F (36.7 C)  Resp: 18   Sclerae unicteric, EOMs intact Oropharynx clear, dentition in good repair No cervical or supraclavicular adenopathy Lungs no rales or rhonchi Heart regular rate and rhythm Abd soft, nontender, positive bowel sounds MSK mild to moderate tenderness over the upper lumbar area; normal straight leg raising bilaterally Neuro: nonfocal, and specifically 5 over 5 dorsiflexion bilaterally; well oriented, appropriate affect Breasts: Deferred   LAB RESULTS: Outside labs reviewed.No components found for: LABCA125 CBC    Component Value Date/Time   WBC 3.2* 06/28/2015 0857   WBC 2.6* 02/16/2015 1431   RBC 3.19* 06/28/2015 0857   RBC 3.01* 02/16/2015 1431   HGB 11.0* 06/28/2015 0857   HGB 10.5* 02/16/2015 1431   HCT 32.6* 06/28/2015 0857   HCT 31.2* 02/16/2015 1431   PLT 369 06/28/2015 0857   PLT 139* 02/16/2015 1431   MCV 102.0* 06/28/2015 0857   MCV 103.7* 02/16/2015 1431   MCH 34.4* 06/28/2015 0857   MCH 34.9* 02/16/2015 1431   MCHC 33.7 06/28/2015 0857   MCHC 33.7 02/16/2015 1431   RDW 16.7* 06/28/2015 0857   RDW 14.9  02/16/2015 1431   LYMPHSABS 0.3* 06/28/2015 0857   LYMPHSABS 0.3* 02/16/2015 1431   MONOABS 0.3 06/28/2015 0857   MONOABS 0.2 02/16/2015 1431   EOSABS 0.0 06/28/2015 0857   EOSABS 0.1 02/16/2015 1431   BASOSABS 0.1 06/28/2015 0857   BASOSABS 0.0 02/16/2015 1431      Recent Labs Lab 06/28/15 0857  INR 4.20*    Urinalysis    Component Value Date/Time   COLORURINE YELLOW 01/23/2015 1830   APPEARANCEUR CLEAR 01/23/2015 1830   LABSPEC 1.010 01/23/2015 1830   PHURINE 6.0 01/23/2015 1830   GLUCOSEU NEGATIVE 01/23/2015 1830   HGBUR LARGE* 01/23/2015 1830   BILIRUBINUR NEGATIVE 01/23/2015 [place] 01/23/2015 1830   PROTEINUR TRACE* 01/23/2015 1830   UROBILINOGEN 0.2 01/23/2015 1830   NITRITE NEGATIVE 01/23/2015 1830   LEUKOCYTESUR NEGATIVE 01/23/2015 1830    STUDIES: Dg Thoracic Spine 2 View  06/28/2015  CLINICAL DATA:  Low back pain with radiation to left side. EXAM: THORACIC SPINE 2 VIEWS COMPARISON:  04/30/2015. FINDINGS: Diffuse multilevel degenerative change thoracic spine. No acute abnormality identified. No focal bony abnormality. Surgical clips noted over the anterior chest. IMPRESSION: Diffuse multilevel prominent degenerative change. Mild scoliosis concave left. No acute abnormality. Electronically Signed   By: Marcello Moores  Register   On: 06/28/2015 12:11   Dg Lumbar Spine 2-3 Views  06/28/2015  CLINICAL DATA:  Worsening mid and lower back pain, left side pain for about 3 weeks, no known injury EXAM: LUMBAR SPINE - 2-3 VIEW COMPARISON:  Lumbar spine MRI   11/04/2014 FINDINGS: Three views of lumbar spine submitted. Minimal lower lumbar levoscoliosis. Alignment is preserved. Again noted significant disc space flattening at L4-L5 level. Stable disc space flattening at L5-S1 level. Poorly visualized lytic lesions in L4 and L2 vertebral body are stable. Facet degenerative changes L4 and L5 level again noted. There is subtle compression deformity upper endplate of J69  vertebral body. Subtle  pathologic fracture cannot be excluded. Clinical correlation is necessary. Further correlation with MRI could be performed as clinically warranted. IMPRESSION: Minimal lower lumbar levoscoliosis. Again noted significant disc space flattening at L4-L5 level. Stable disc space flattening at L5-S1 level. Poorly visualized lytic lesions in L4 and L2 vertebral body are stable. Facet degenerative changes L4 and L5 level again noted. There is subtle compression deformity upper endplate of B14 vertebral body. Subtle pathologic fracture cannot be excluded. Clinical correlation is necessary. Further correlation with MRI could be performed as clinically warranted. Electronically Signed   By: Lahoma Crocker M.D.   On: 06/28/2015 12:17    ASSESSMENT: 70 y.o. [name], [place] woman status post right lumpectomy and axillary lymph node dissection December 2006 for a TX N2, stage IIIA invasive ductal carcinoma, estrogen and progesterone receptor positive, with subsequent stage IV disease  (1) received adjuvant doxorubicin, docetaxel and cyclophosphamide  (2) status post adjuvant radiation  (3) on tamoxifen to September 2011 when she was noted to have metastatic disease to bone  (4) fulvestrant 02/06/2009 to 01/15/2011  (5) left axillary lymph node positive for an invasive ductal carcinoma, estrogen receptor and HER-2 negative November 2011, and a right breast recurrence for an estrogen receptor positive lesion at the same time.  (6) status post bilateral mastectomies 06/28/2010  (7) status post left chest and axillary radiation  (8) status post right chest wall recurrence October of 2012, treated with electron beam radiation completed December 2012  (9) progression in bone August 2013,  (10) status post radiation to the lumbosacral spine completed October 2013   (11) capecitabine November 2013 through January 2014  (12) tamoxifen/ everolimus February 2014, discontinued November 2014 with  progression  (13) Megace 80 mg twice daily with progression January 2015  (14) Radiation to lumbar spine February 2015  (15) letrozole plus Ibrance started 07/08/2013  (16). Declines genetic testing (as of 08/17/2013)  (17) zolendronate 05/01/2009, discontinued because of pain; Xgeva started 10/08/2013, discontinued because of cost issues as of November 2015, switched to alendronate March 2016, switched back to [place] but stopped after the May 2016 dose because of side effects, on ibandronate [125 mg/ day 21 days on, 7 days off]currently  OTHER ACTIVE PROBLEMS:  (1) chronic GI complaints, followed by Dr. Sydell Axon  (a) treated for c difficile colitis Sept 2016 (stool Ag positive, toxin negative 01/23/2015), received oral vancomycin started 01/24/2015, stopped 02/07/2015  (2) multiple orthopedic problems, including bilateral chronic foot pain, history of rotator cuff repair  (3) history of extensive superficial phlebitis, on chronic low dose Coumadin.  (4) LUL cavitary pneumonia diagnosed as MRSA while at [facility] April 2016, treated with Zosyn x 21 days  PLAN: [place]'s thoracic and lumbar spine films today suggest a new partial compression fracture at T12. Everything else is stable. Certainly we see significant degenerative disc disease and a couple of small spots of cancer, but all that is unchanged from baseline.  At this point I think we need to proceed to MRI and depending on those results she may proved to be a candidate for kyphoplasty. That test is a reschedule for March 8. I will call her with those results.  I did write her a new prescription for hydrocodone/APAP. She knows to make sure not to run out before a weekend since we would be unable to refill that for her until the following Monday if that happened.  Her INR is supratherapeutic. I have asked her to cut her dose down to 2 mg daily. She  will have that rechecked on March 8 as well.  Otherwise we are continuing the  palbociclib at the current dose. She is taking at exactly as prescribed. She is also tolerating the ibandronate and letrozole with minimal side effects.  She will see me again in May, but we may need to work her in earlier depending on how she does as far as the back pain is concerned.   Chauncey Cruel, MD   06/28/2015 1:22 PM

## 2015-06-28 NOTE — Progress Notes (Signed)
INR today- 4.2 Patient was seen in clinic today by Dr. [name]. Per Dr. [name] patient to take 2 mg per day and check INR in one week.

## 2015-06-29 ENCOUNTER — Telehealth: Payer: Self-pay | Admitting: Oncology

## 2015-06-29 NOTE — Telephone Encounter (Signed)
Patient called back to say that she gets her MD where she lives to do her INR check and they will fax GM the results

## 2015-06-29 NOTE — Telephone Encounter (Signed)
Left message for patient to call office to schedule an INR appt per 3/1 pof

## 2015-07-05 ENCOUNTER — Ambulatory Visit (HOSPITAL_COMMUNITY)
Admission: RE | Admit: 2015-07-05 | Discharge: 2015-07-05 | Disposition: A | Discharge status: Home / Self Care | Payer: Medicare PPO | Source: Ambulatory Visit | Attending: Internal Medicine | Admitting: Internal Medicine

## 2015-07-05 ENCOUNTER — Encounter: Payer: Self-pay | Admitting: Oncology

## 2015-07-05 DIAGNOSIS — M899 Disorder of bone, unspecified: Secondary | ICD-10-CM | POA: Diagnosis not present

## 2015-07-05 DIAGNOSIS — C801 Malignant (primary) neoplasm, unspecified: Secondary | ICD-10-CM | POA: Insufficient documentation

## 2015-07-05 DIAGNOSIS — M5126 Other intervertebral disc displacement, lumbar region: Secondary | ICD-10-CM | POA: Diagnosis not present

## 2015-07-05 DIAGNOSIS — M4690 Unspecified inflammatory spondylopathy, site unspecified: Secondary | ICD-10-CM | POA: Insufficient documentation

## 2015-07-05 DIAGNOSIS — C7951 Secondary malignant neoplasm of bone: Secondary | ICD-10-CM | POA: Diagnosis not present

## 2015-07-05 DIAGNOSIS — M545 Low back pain: Secondary | ICD-10-CM | POA: Diagnosis present

## 2015-07-05 DIAGNOSIS — M4806 Spinal stenosis, lumbar region: Secondary | ICD-10-CM | POA: Insufficient documentation

## 2015-07-05 MED ORDER — GADOBENATE DIMEGLUMINE 529 MG/ML IV SOLN
10.0000 mL | Freq: Once | INTRAVENOUS | Status: AC | PRN
  Administered 2015-07-05: 10 mL via INTRAVENOUS

## 2015-07-05 NOTE — Progress Notes (Signed)
Per [facility]... Letrozole will be shipped 07/10/15

## 2015-07-06 ENCOUNTER — Encounter: Payer: Self-pay | Admitting: Oncology

## 2015-07-06 NOTE — Progress Notes (Signed)
Per [employer] ibrance was shipped 07/05/15

## 2015-07-20 NOTE — Progress Notes (Signed)
Received outside labs - placed in MD's basket for review.

## 2015-08-01 ENCOUNTER — Encounter: Payer: Self-pay | Admitting: Oncology

## 2015-08-01 NOTE — Progress Notes (Signed)
ibrance was shipped 08/01/15 per [employer]

## 2015-08-09 ENCOUNTER — Encounter: Payer: Self-pay | Admitting: *Deleted

## 2015-08-09 ENCOUNTER — Telehealth: Payer: Self-pay | Admitting: *Deleted

## 2015-08-09 NOTE — Telephone Encounter (Signed)
Pt left message stating " I was in the ER at [facility] this weekend and was told some very devasting news and was told to call to be seen by my oncologist before my scheduled appointment

## 2015-08-10 ENCOUNTER — Other Ambulatory Visit: Payer: Self-pay | Admitting: *Deleted

## 2015-08-10 ENCOUNTER — Telehealth: Payer: Self-pay | Admitting: *Deleted

## 2015-08-10 ENCOUNTER — Other Ambulatory Visit: Payer: Self-pay | Admitting: Oncology

## 2015-08-10 DIAGNOSIS — C50911 Malignant neoplasm of unspecified site of right female breast: Secondary | ICD-10-CM

## 2015-08-10 DIAGNOSIS — R9089 Other abnormal findings on diagnostic imaging of central nervous system: Secondary | ICD-10-CM

## 2015-08-10 DIAGNOSIS — C50919 Malignant neoplasm of unspecified site of unspecified female breast: Secondary | ICD-10-CM

## 2015-08-10 DIAGNOSIS — C7951 Secondary malignant neoplasm of bone: Principal | ICD-10-CM

## 2015-08-10 DIAGNOSIS — C50912 Malignant neoplasm of unspecified site of left female breast: Secondary | ICD-10-CM

## 2015-08-10 DIAGNOSIS — R404 Transient alteration of awareness: Secondary | ICD-10-CM

## 2015-08-10 NOTE — Telephone Encounter (Signed)
Records received per [facility] and given to MD

## 2015-08-10 NOTE — Progress Notes (Unsigned)
[name] called yesterday to tell as she had had a strange feeling in her neck and spasm was all the way down to her waist with nausea and vomiting which took her to [facility]. She says she goes to [facility] because his much farther to go to [facility] even though she understands we are not on the electronic medical record for [facility] hospital at any rate she says that it some CTs of the head and blood cultures and gave her a shot. She says they found spots on her skull. They told her that she needed to see her medical oncologist immediately.  The symptoms appear to have resolved and she is planning to get back to work today.  We discussed the fact that spots in her skull are likely other spots in bones elsewhere and there treated the same. Nevertheless we have not imaged dose at least not recently. She agreed to an MRI of the brain but she does not want that done until May 4 when she is already scheduled to have a PET scan and therefore already scheduled to miss work. She already has an appointment with me for May 10.  She knows to call for any other problems that may develop before then.

## 2015-08-10 NOTE — Telephone Encounter (Signed)
This RN returned call to pt per her VM stating ER visit to [facility] this past weekend- obtained VM- general message left to return call to this RN.  This RN called [facility] for medical records and was sent to an automated VM- request left for above.  Call returned to this RN's VM - stating direct number for request as [phone] or may fax request on MD letter head to [phone].  This RN faxed above request on letter head to above number.

## 2015-08-11 ENCOUNTER — Other Ambulatory Visit: Payer: Self-pay | Admitting: *Deleted

## 2015-08-11 DIAGNOSIS — C50112 Malignant neoplasm of central portion of left female breast: Secondary | ICD-10-CM

## 2015-08-11 DIAGNOSIS — C50919 Malignant neoplasm of unspecified site of unspecified female breast: Secondary | ICD-10-CM

## 2015-08-15 ENCOUNTER — Other Ambulatory Visit: Payer: Self-pay

## 2015-08-23 ENCOUNTER — Other Ambulatory Visit: Payer: Self-pay | Admitting: *Deleted

## 2015-08-23 DIAGNOSIS — C50919 Malignant neoplasm of unspecified site of unspecified female breast: Secondary | ICD-10-CM

## 2015-08-23 DIAGNOSIS — C50112 Malignant neoplasm of central portion of left female breast: Secondary | ICD-10-CM

## 2015-08-23 MED ORDER — LETROZOLE 2.5 MG PO TABS: 2.5000 mg | ORAL_TABLET | Freq: Every day | ORAL | Status: DC

## 2015-08-23 NOTE — Telephone Encounter (Signed)
No entry 

## 2015-08-24 ENCOUNTER — Telehealth: Payer: Self-pay | Admitting: *Deleted

## 2015-08-24 ENCOUNTER — Other Ambulatory Visit: Payer: Self-pay | Admitting: *Deleted

## 2015-08-24 ENCOUNTER — Inpatient Hospital Stay (HOSPITAL_COMMUNITY): Admission: RE | Admit: 2015-08-24 | Payer: Medicare PPO | Source: Ambulatory Visit

## 2015-08-24 DIAGNOSIS — C7951 Secondary malignant neoplasm of bone: Secondary | ICD-10-CM

## 2015-08-24 DIAGNOSIS — R202 Paresthesia of skin: Secondary | ICD-10-CM

## 2015-08-24 DIAGNOSIS — C50919 Malignant neoplasm of unspecified site of unspecified female breast: Secondary | ICD-10-CM

## 2015-08-24 DIAGNOSIS — G893 Neoplasm related pain (acute) (chronic): Secondary | ICD-10-CM

## 2015-08-24 MED ORDER — DEXAMETHASONE 4 MG PO TABS: 8.0000 mg | ORAL_TABLET | Freq: Two times a day (BID) | ORAL | Status: DC

## 2015-08-24 NOTE — Telephone Encounter (Signed)
This RN returned call to pt per message.  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  The above discussed with pt - she states she cannot leave work today until 3 pm- and then she does not drive after dark nor " have anyone to drive me too late"  " [place]  Per central scheduling- next available MRI is for 5 pm tomorrow- 4/28  Per MD pt may need to proceed to the ER for evaluation- pt should start on decadron today.  This RN sent order for decadron to confirmed pharmacy of [facility] in [place].  MRI appointment is pending at this time per need to pre authorize per Tasha in MRI at [facility].

## 2015-08-25 ENCOUNTER — Emergency Department (HOSPITAL_COMMUNITY)
Admission: EM | Admit: 2015-08-25 | Discharge: 2015-08-25 | Disposition: A | Discharge status: Home / Self Care | Payer: Medicare PPO | Attending: Emergency Medicine | Admitting: Emergency Medicine

## 2015-08-25 ENCOUNTER — Emergency Department (HOSPITAL_COMMUNITY): Payer: Medicare PPO

## 2015-08-25 ENCOUNTER — Encounter (HOSPITAL_COMMUNITY): Payer: Self-pay | Admitting: Emergency Medicine

## 2015-08-25 DIAGNOSIS — Z79891 Long term (current) use of opiate analgesic: Secondary | ICD-10-CM | POA: Diagnosis not present

## 2015-08-25 DIAGNOSIS — Z7952 Long term (current) use of systemic steroids: Secondary | ICD-10-CM | POA: Diagnosis not present

## 2015-08-25 DIAGNOSIS — Z853 Personal history of malignant neoplasm of breast: Secondary | ICD-10-CM | POA: Insufficient documentation

## 2015-08-25 DIAGNOSIS — K219 Gastro-esophageal reflux disease without esophagitis: Secondary | ICD-10-CM | POA: Diagnosis not present

## 2015-08-25 DIAGNOSIS — Z9013 Acquired absence of bilateral breasts and nipples: Secondary | ICD-10-CM | POA: Insufficient documentation

## 2015-08-25 DIAGNOSIS — Z87891 Personal history of nicotine dependence: Secondary | ICD-10-CM | POA: Insufficient documentation

## 2015-08-25 DIAGNOSIS — Z79899 Other long term (current) drug therapy: Secondary | ICD-10-CM | POA: Diagnosis not present

## 2015-08-25 DIAGNOSIS — Z7951 Long term (current) use of inhaled steroids: Secondary | ICD-10-CM | POA: Insufficient documentation

## 2015-08-25 DIAGNOSIS — R51 Headache: Secondary | ICD-10-CM | POA: Diagnosis not present

## 2015-08-25 DIAGNOSIS — Z85830 Personal history of malignant neoplasm of bone: Secondary | ICD-10-CM | POA: Insufficient documentation

## 2015-08-25 DIAGNOSIS — M549 Dorsalgia, unspecified: Secondary | ICD-10-CM | POA: Insufficient documentation

## 2015-08-25 DIAGNOSIS — I10 Essential (primary) hypertension: Secondary | ICD-10-CM | POA: Insufficient documentation

## 2015-08-25 DIAGNOSIS — Z85038 Personal history of other malignant neoplasm of large intestine: Secondary | ICD-10-CM | POA: Diagnosis not present

## 2015-08-25 DIAGNOSIS — Z7901 Long term (current) use of anticoagulants: Secondary | ICD-10-CM | POA: Insufficient documentation

## 2015-08-25 HISTORY — DX: Secondary malignant neoplasm of unspecified site: C79.9

## 2015-08-25 LAB — CBC WITH DIFFERENTIAL/PLATELET
BASOS ABS: 0 10*3/uL (ref 0.0–0.1)
Basophils Relative: 0 %
EOS PCT: 0 %
Eosinophils Absolute: 0 10*3/uL (ref 0.0–0.7)
HEMATOCRIT: 27.4 % — AB (ref 36.0–46.0)
Hemoglobin: 9.1 g/dL — ABNORMAL LOW (ref 12.0–15.0)
LYMPHS ABS: 0.2 10*3/uL — AB (ref 0.7–4.0)
LYMPHS PCT: 3 %
MCH: 34.2 pg — AB (ref 26.0–34.0)
MCHC: 33.2 g/dL (ref 30.0–36.0)
MCV: 103 fL — AB (ref 78.0–100.0)
MONO ABS: 0.1 10*3/uL (ref 0.1–1.0)
MONOS PCT: 1 %
NEUTROS ABS: 4.7 10*3/uL (ref 1.7–7.7)
Neutrophils Relative %: 96 %
PLATELETS: 180 10*3/uL (ref 150–400)
RBC: 2.66 MIL/uL — ABNORMAL LOW (ref 3.87–5.11)
RDW: 16.1 % — ABNORMAL HIGH (ref 11.5–15.5)
WBC: 4.9 10*3/uL (ref 4.0–10.5)

## 2015-08-25 LAB — COMPREHENSIVE METABOLIC PANEL
ALT: 17 U/L (ref 14–54)
ANION GAP: 12 (ref 5–15)
AST: 24 U/L (ref 15–41)
Albumin: 2.9 g/dL — ABNORMAL LOW (ref 3.5–5.0)
Alkaline Phosphatase: 94 U/L (ref 38–126)
BUN: 35 mg/dL — ABNORMAL HIGH (ref 6–20)
CALCIUM: 8.8 mg/dL — AB (ref 8.9–10.3)
CHLORIDE: 103 mmol/L (ref 101–111)
CO2: 26 mmol/L (ref 22–32)
Creatinine, Ser: 0.97 mg/dL (ref 0.44–1.00)
GFR calc non Af Amer: 57 mL/min — ABNORMAL LOW (ref >60)
Glucose, Bld: 123 mg/dL — ABNORMAL HIGH (ref 65–99)
Potassium: 4.1 mmol/L (ref 3.5–5.1)
SODIUM: 141 mmol/L (ref 135–145)
Total Bilirubin: 0.2 mg/dL — ABNORMAL LOW (ref 0.3–1.2)
Total Protein: 6.8 g/dL (ref 6.5–8.1)

## 2015-08-25 LAB — PROTIME-INR
INR: 2.62 — AB (ref 0.00–1.49)
PROTHROMBIN TIME: 27.7 s — AB (ref 11.6–15.2)

## 2015-08-25 MED ORDER — HYDROMORPHONE HCL 1 MG/ML IJ SOLN
1.0000 mg | Freq: Once | INTRAMUSCULAR | Status: AC
  Administered 2015-08-25: 1 mg via INTRAVENOUS
  Filled 2015-08-25: qty 1

## 2015-08-25 MED ORDER — SODIUM CHLORIDE 0.9 % IV BOLUS (SEPSIS)
1000.0000 mL | Freq: Once | INTRAVENOUS | Status: AC
  Administered 2015-08-25: 1000 mL via INTRAVENOUS

## 2015-08-25 MED ORDER — OXYCODONE-ACETAMINOPHEN 10-325 MG PO TABS: 1.0000 | ORAL_TABLET | Freq: Three times a day (TID) | ORAL | Status: DC | PRN

## 2015-08-25 MED ORDER — GADOBENATE DIMEGLUMINE 529 MG/ML IV SOLN
11.0000 mL | Freq: Once | INTRAVENOUS | Status: AC | PRN
  Administered 2015-08-25: 11 mL via INTRAVENOUS

## 2015-08-25 MED ORDER — METHYLPREDNISOLONE SODIUM SUCC 125 MG IJ SOLR
125.0000 mg | Freq: Once | INTRAMUSCULAR | Status: AC
  Administered 2015-08-25: 125 mg via INTRAVENOUS
  Filled 2015-08-25: qty 2

## 2015-08-25 MED ORDER — ONDANSETRON HCL 4 MG/2ML IJ SOLN
4.0000 mg | Freq: Once | INTRAMUSCULAR | Status: AC
  Administered 2015-08-25: 4 mg via INTRAVENOUS
  Filled 2015-08-25: qty 2

## 2015-08-25 NOTE — ED Provider Notes (Signed)
5:27 PM Assumed care at change of shift with imaging pending. Results as below. Bony mets likely etiology of symptoms. Will change hydrocodone to oxycodone. May help with symptoms. Has establish oncology care.   [name] Wo Contrast  08/25/2015  CLINICAL DATA:  Ongoing headache. Arm pain and pain beneath the sternum and ribs for 2 weeks. Right leg pain. Metastatic breast cancer. EXAM: MRI HEAD WITHOUT AND WITH CONTRAST; MRI CERVICAL SPINE WITHOUT AND WITH CONTRAST; MRI LUMBAR SPINE WITHOUT AND WITH CONTRAST; MRI THORACIC SPINE WITHOUT AND WITH CONTRAST TECHNIQUE: Multiplanar, multiecho pulse sequences of the brain and surrounding structures were obtained according to standard protocol without and with intravenous contrast; Multiplanar and multiecho pulse sequences of the cervical spine, to include the craniocervical junction and cervicothoracic junction, were obtained according to standard protocol without and with intravenous contrast.; Multiplanar and multiecho pulse sequences of the lumbar spine were obtained without and with intravenous contrast.; Multiplanar and multiecho pulse sequences of the thoracic spine were obtained without and with intravenous contrast. CONTRAST:  100mL MULTIHANCE GADOBENATE DIMEGLUMINE 529 MG/ML IV SOLN COMPARISON:  Head CT 08/05/2015 and MRI 02/14/2009. Cervical spine MRI 05/14/2013. Thoracic and lumbar spine MRI 07/05/2015. FINDINGS: Carolyn HEAD FINDINGS: There is no evidence of acute infarct, intracranial hemorrhage, mass, midline shift, or extra-axial fluid collection. There is mild cerebral atrophy. Small foci of T2 hyperintensity in the cerebral white matter bilaterally, stable to slightly increased from 2010 and nonspecific but compatible with mild-to-moderate chronic small vessel ischemic disease. No enhancing brain lesions are identified. There is at most minimally prominent dural enhancement without nodularity. Small enhancing lesions are present throughout the skull,  progressive from 2010 (for example 3 cm left parietal lesion on image 28 of series 41). There is involvement of the clivus. Prior bilateral cataract extraction is noted. There is a trace left mastoid effusion. No significant paranasal sinus inflammatory disease. Major intracranial vascular flow voids are preserved. Carolyn CERVICAL SPINE FINDINGS: Cervical spine straightening is unchanged. There is no significant listhesis. There is progressive osseous metastatic disease throughout the cervical spine since the 2015 MRI. There is now complete marrow replacement involving the C4 vertebral body with patchy enhancement including a 1 cm focus posteriorly. There is also a left-sided posterior element involvement at C4 which is new. No epidural tumor or spinal cord compression is identified. No cervical spinal cord signal abnormality is identified within limitations of motion artifact. Diffuse cervical disc degeneration is again seen. There is mild neural foraminal stenosis on the right at C3-4, on the left at C4-5, and likely bilaterally at C5-6 and C6-7. This is overall similar to the prior MRI. No significant spinal stenosis is seen. The paraspinal soft tissues are unremarkable. Carolyn THORACIC SPINE FINDINGS: Diffuse osseous metastatic disease is again seen throughout the vertebral bodies and posterior elements of the thoracic spine. There has overall been mild progression from the 07/05/2015 thoracic spine MRI. There is now near complete involvement of the T10 vertebral body. There is mildly progressive marrow replacement involving multiple mid thoracic vertebral bodies as well as T1 and T2. The T3 vertebral body remains completely involved. There is a mild T12 superior endplate compression fracture which demonstrates slightly progressive height loss compared to the prior study. There is slightly prominent ventral epidural enhancement in the mid thoracic spine from T5 -T8, most conspicuous and mildly nodular in a left  paracentral location at the T8 superior endplate level (series 37, image 33). This may reflect very small volume epidural tumor, prominent epidural venous  plexus, or a combination of both. This has likely not significantly changed from the prior thoracic spine MRI, although there is much more motion artifact on axial sequences on that study. The spinal cord is normal in caliber and signal. There is no spinal cord compression. Multilevel disc degeneration is again seen with disc bulging and small disc protrusions without significant spinal canal or neural foraminal stenosis. Small left adrenal nodule and mild left upper pole renal caliectasis or cysts are partially visualized, grossly similar to the 09/07/2014 PET-CT. Carolyn LUMBAR SPINE FINDINGS: Vertebral alignment is unchanged, with trace anterolisthesis again seen of L3 on L4. Osseous lesions throughout the lumbar spine do not appear significantly changed from the prior lumbar spine MRI, with the largest lesion in the L1 vertebral body. S3 level sacral osseous metastatic disease is partially visualized and grossly similar to the prior study. 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 Lumbar vertebral body heights are preserved. There is diffuse lumbar disc desiccation. Severe disc space height loss at L4-5 is unchanged. Multilevel disc and facet degeneration does not appear significantly changed, with mild right foraminal stenosis again seen at L3-4. There is no spinal stenosis. No epidural tumor is identified. Conus medullaris is normal in signal and terminates at L1. IMPRESSION: 1. No acute intracranial abnormality. No evidence of brain metastases. 2. Numerous osseous skull metastases, progressed from 2010. 3. Widespread spinal osseous metastatic disease, with interval progression in the cervical and thoracic spine. No sizable epidural tumor identified. Slightly prominent ventral epidural enhancement in the mid thoracic spine is favored to mostly reflect  epidural venous plexus, however trace epidural tumor is possible particularly at T8. 4. No spinal stenosis. 5. Mild T12 superior endplate compression fracture with slightly progressive height loss from 07/05/2015. Electronically Signed   By: Logan Bores M.D.   On: 08/25/2015 17:08   [facility] Cervical Spine W Wo Contrast  08/25/2015  CLINICAL DATA:  Ongoing headache. Arm pain and pain beneath the sternum and ribs for 2 weeks. Right leg pain. Metastatic breast cancer. EXAM: MRI HEAD WITHOUT AND WITH CONTRAST; MRI CERVICAL SPINE WITHOUT AND WITH CONTRAST; MRI LUMBAR SPINE WITHOUT AND WITH CONTRAST; MRI THORACIC SPINE WITHOUT AND WITH CONTRAST TECHNIQUE: Multiplanar, multiecho pulse sequences of the brain and surrounding structures were obtained according to standard protocol without and with intravenous contrast; Multiplanar and multiecho pulse sequences of the cervical spine, to include the craniocervical junction and cervicothoracic junction, were obtained according to standard protocol without and with intravenous contrast.; Multiplanar and multiecho pulse sequences of the lumbar spine were obtained without and with intravenous contrast.; Multiplanar and multiecho pulse sequences of the thoracic spine were obtained without and with intravenous contrast. CONTRAST:  58mL MULTIHANCE GADOBENATE DIMEGLUMINE 529 MG/ML IV SOLN COMPARISON:  Head CT 08/05/2015 and MRI 02/14/2009. Cervical spine MRI 05/14/2013. Thoracic and lumbar spine MRI 07/05/2015. FINDINGS: Carolyn HEAD FINDINGS: There is no evidence of acute infarct, intracranial hemorrhage, mass, midline shift, or extra-axial fluid collection. There is mild cerebral atrophy. Small foci of T2 hyperintensity in the cerebral white matter bilaterally, stable to slightly increased from 2010 and nonspecific but compatible with mild-to-moderate chronic small vessel ischemic disease. No enhancing brain lesions are identified. There is at most minimally prominent dural enhancement  without nodularity. Small enhancing lesions are present throughout the skull, progressive from 2010 (for example 3 cm left parietal lesion on image 28 of series 41). There is involvement of the clivus. Prior bilateral cataract extraction is noted. There is a trace left mastoid effusion. No  significant paranasal sinus inflammatory disease. Major intracranial vascular flow voids are preserved. Carolyn CERVICAL SPINE FINDINGS: Cervical spine straightening is unchanged. There is no significant listhesis. There is progressive osseous metastatic disease throughout the cervical spine since the 2015 MRI. There is now complete marrow replacement involving the C4 vertebral body with patchy enhancement including a 1 cm focus posteriorly. There is also a left-sided posterior element involvement at C4 which is new. No epidural tumor or spinal cord compression is identified. No cervical spinal cord signal abnormality is identified within limitations of motion artifact. Diffuse cervical disc degeneration is again seen. There is mild neural foraminal stenosis on the right at C3-4, on the left at C4-5, and likely bilaterally at C5-6 and C6-7. This is overall similar to the prior MRI. No significant spinal stenosis is seen. The paraspinal soft tissues are unremarkable. Carolyn THORACIC SPINE FINDINGS: Diffuse osseous metastatic disease is again seen throughout the vertebral bodies and posterior elements of the thoracic spine. There has overall been mild progression from the 07/05/2015 thoracic spine MRI. There is now near complete involvement of the T10 vertebral body. There is mildly progressive marrow replacement involving multiple mid thoracic vertebral bodies as well as T1 and T2. The T3 vertebral body remains completely involved. There is a mild T12 superior endplate compression fracture which demonstrates slightly progressive height loss compared to the prior study. There is slightly prominent ventral epidural enhancement in the mid  thoracic spine from T5 -T8, most conspicuous and mildly nodular in a left paracentral location at the T8 superior endplate level (series 37, image 33). This may reflect very small volume epidural tumor, prominent epidural venous plexus, or a combination of both. This has likely not significantly changed from the prior thoracic spine MRI, although there is much more motion artifact on axial sequences on that study. The spinal cord is normal in caliber and signal. There is no spinal cord compression. Multilevel disc degeneration is again seen with disc bulging and small disc protrusions without significant spinal canal or neural foraminal stenosis. Small left adrenal nodule and mild left upper pole renal caliectasis or cysts are partially visualized, grossly similar to the 09/07/2014 PET-CT. Carolyn LUMBAR SPINE FINDINGS: Vertebral alignment is unchanged, with trace anterolisthesis again seen of L3 on L4. Osseous lesions throughout the lumbar spine do not appear significantly changed from the prior lumbar spine MRI, with the largest lesion in the L1 vertebral body. S3 level sacral osseous metastatic disease is partially visualized and grossly similar to the prior study. 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 Lumbar vertebral body heights are preserved. There is diffuse lumbar disc desiccation. Severe disc space height loss at L4-5 is unchanged. Multilevel disc and facet degeneration does not appear significantly changed, with mild right foraminal stenosis again seen at L3-4. There is no spinal stenosis. No epidural tumor is identified. Conus medullaris is normal in signal and terminates at L1. IMPRESSION: 1. No acute intracranial abnormality. No evidence of brain metastases. 2. Numerous osseous skull metastases, progressed from 2010. 3. Widespread spinal osseous metastatic disease, with interval progression in the cervical and thoracic spine. No sizable epidural tumor identified. Slightly prominent ventral  epidural enhancement in the mid thoracic spine is favored to mostly reflect epidural venous plexus, however trace epidural tumor is possible particularly at T8. 4. No spinal stenosis. 5. Mild T12 superior endplate compression fracture with slightly progressive height loss from 07/05/2015. Electronically Signed   By: Logan Bores M.D.   On: 08/25/2015 17:08   [facility] Thoracic  Spine W Wo Contrast  08/25/2015  CLINICAL DATA:  Ongoing headache. Arm pain and pain beneath the sternum and ribs for 2 weeks. Right leg pain. Metastatic breast cancer. EXAM: MRI HEAD WITHOUT AND WITH CONTRAST; MRI CERVICAL SPINE WITHOUT AND WITH CONTRAST; MRI LUMBAR SPINE WITHOUT AND WITH CONTRAST; MRI THORACIC SPINE WITHOUT AND WITH CONTRAST TECHNIQUE: Multiplanar, multiecho pulse sequences of the brain and surrounding structures were obtained according to standard protocol without and with intravenous contrast; Multiplanar and multiecho pulse sequences of the cervical spine, to include the craniocervical junction and cervicothoracic junction, were obtained according to standard protocol without and with intravenous contrast.; Multiplanar and multiecho pulse sequences of the lumbar spine were obtained without and with intravenous contrast.; Multiplanar and multiecho pulse sequences of the thoracic spine were obtained without and with intravenous contrast. CONTRAST:  55mL MULTIHANCE GADOBENATE DIMEGLUMINE 529 MG/ML IV SOLN COMPARISON:  Head CT 08/05/2015 and MRI 02/14/2009. Cervical spine MRI 05/14/2013. Thoracic and lumbar spine MRI 07/05/2015. FINDINGS: Carolyn HEAD FINDINGS: There is no evidence of acute infarct, intracranial hemorrhage, mass, midline shift, or extra-axial fluid collection. There is mild cerebral atrophy. Small foci of T2 hyperintensity in the cerebral white matter bilaterally, stable to slightly increased from 2010 and nonspecific but compatible with mild-to-moderate chronic small vessel ischemic disease. No enhancing brain  lesions are identified. There is at most minimally prominent dural enhancement without nodularity. Small enhancing lesions are present throughout the skull, progressive from 2010 (for example 3 cm left parietal lesion on image 28 of series 41). There is involvement of the clivus. Prior bilateral cataract extraction is noted. There is a trace left mastoid effusion. No significant paranasal sinus inflammatory disease. Major intracranial vascular flow voids are preserved. Carolyn CERVICAL SPINE FINDINGS: Cervical spine straightening is unchanged. There is no significant listhesis. There is progressive osseous metastatic disease throughout the cervical spine since the 2015 MRI. There is now complete marrow replacement involving the C4 vertebral body with patchy enhancement including a 1 cm focus posteriorly. There is also a left-sided posterior element involvement at C4 which is new. No epidural tumor or spinal cord compression is identified. No cervical spinal cord signal abnormality is identified within limitations of motion artifact. Diffuse cervical disc degeneration is again seen. There is mild neural foraminal stenosis on the right at C3-4, on the left at C4-5, and likely bilaterally at C5-6 and C6-7. This is overall similar to the prior MRI. No significant spinal stenosis is seen. The paraspinal soft tissues are unremarkable. Carolyn THORACIC SPINE FINDINGS: Diffuse osseous metastatic disease is again seen throughout the vertebral bodies and posterior elements of the thoracic spine. There has overall been mild progression from the 07/05/2015 thoracic spine MRI. There is now near complete involvement of the T10 vertebral body. There is mildly progressive marrow replacement involving multiple mid thoracic vertebral bodies as well as T1 and T2. The T3 vertebral body remains completely involved. There is a mild T12 superior endplate compression fracture which demonstrates slightly progressive height loss compared to the prior  study. There is slightly prominent ventral epidural enhancement in the mid thoracic spine from T5 -T8, most conspicuous and mildly nodular in a left paracentral location at the T8 superior endplate level (series 37, image 33). This may reflect very small volume epidural tumor, prominent epidural venous plexus, or a combination of both. This has likely not significantly changed from the prior thoracic spine MRI, although there is much more motion artifact on axial sequences on that study. The spinal cord is normal  in caliber and signal. There is no spinal cord compression. Multilevel disc degeneration is again seen with disc bulging and small disc protrusions without significant spinal canal or neural foraminal stenosis. Small left adrenal nodule and mild left upper pole renal caliectasis or cysts are partially visualized, grossly similar to the 09/07/2014 PET-CT. Carolyn LUMBAR SPINE FINDINGS: Vertebral alignment is unchanged, with trace anterolisthesis again seen of L3 on L4. Osseous lesions throughout the lumbar spine do not appear significantly changed from the prior lumbar spine MRI, with the largest lesion in the L1 vertebral body. S3 level sacral osseous metastatic disease is partially visualized and grossly similar to the prior study. 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 Lumbar vertebral body heights are preserved. There is diffuse lumbar disc desiccation. Severe disc space height loss at L4-5 is unchanged. Multilevel disc and facet degeneration does not appear significantly changed, with mild right foraminal stenosis again seen at L3-4. There is no spinal stenosis. No epidural tumor is identified. Conus medullaris is normal in signal and terminates at L1. IMPRESSION: 1. No acute intracranial abnormality. No evidence of brain metastases. 2. Numerous osseous skull metastases, progressed from 2010. 3. Widespread spinal osseous metastatic disease, with interval progression in the cervical and thoracic  spine. No sizable epidural tumor identified. Slightly prominent ventral epidural enhancement in the mid thoracic spine is favored to mostly reflect epidural venous plexus, however trace epidural tumor is possible particularly at T8. 4. No spinal stenosis. 5. Mild T12 superior endplate compression fracture with slightly progressive height loss from 07/05/2015. Electronically Signed   By: Logan Bores M.D.   On: 08/25/2015 17:08   [facility] Lumbar Spine W Wo Contrast  08/25/2015  CLINICAL DATA:  Ongoing headache. Arm pain and pain beneath the sternum and ribs for 2 weeks. Right leg pain. Metastatic breast cancer. EXAM: MRI HEAD WITHOUT AND WITH CONTRAST; MRI CERVICAL SPINE WITHOUT AND WITH CONTRAST; MRI LUMBAR SPINE WITHOUT AND WITH CONTRAST; MRI THORACIC SPINE WITHOUT AND WITH CONTRAST TECHNIQUE: Multiplanar, multiecho pulse sequences of the brain and surrounding structures were obtained according to standard protocol without and with intravenous contrast; Multiplanar and multiecho pulse sequences of the cervical spine, to include the craniocervical junction and cervicothoracic junction, were obtained according to standard protocol without and with intravenous contrast.; Multiplanar and multiecho pulse sequences of the lumbar spine were obtained without and with intravenous contrast.; Multiplanar and multiecho pulse sequences of the thoracic spine were obtained without and with intravenous contrast. CONTRAST:  37mL MULTIHANCE GADOBENATE DIMEGLUMINE 529 MG/ML IV SOLN COMPARISON:  Head CT 08/05/2015 and MRI 02/14/2009. Cervical spine MRI 05/14/2013. Thoracic and lumbar spine MRI 07/05/2015. FINDINGS: Carolyn HEAD FINDINGS: There is no evidence of acute infarct, intracranial hemorrhage, mass, midline shift, or extra-axial fluid collection. There is mild cerebral atrophy. Small foci of T2 hyperintensity in the cerebral white matter bilaterally, stable to slightly increased from 2010 and nonspecific but compatible with  mild-to-moderate chronic small vessel ischemic disease. No enhancing brain lesions are identified. There is at most minimally prominent dural enhancement without nodularity. Small enhancing lesions are present throughout the skull, progressive from 2010 (for example 3 cm left parietal lesion on image 28 of series 41). There is involvement of the clivus. Prior bilateral cataract extraction is noted. There is a trace left mastoid effusion. No significant paranasal sinus inflammatory disease. Major intracranial vascular flow voids are preserved. Carolyn CERVICAL SPINE FINDINGS: Cervical spine straightening is unchanged. There is no significant listhesis. There is progressive osseous metastatic disease throughout the cervical spine  since the 2015 MRI. There is now complete marrow replacement involving the C4 vertebral body with patchy enhancement including a 1 cm focus posteriorly. There is also a left-sided posterior element involvement at C4 which is new. No epidural tumor or spinal cord compression is identified. No cervical spinal cord signal abnormality is identified within limitations of motion artifact. Diffuse cervical disc degeneration is again seen. There is mild neural foraminal stenosis on the right at C3-4, on the left at C4-5, and likely bilaterally at C5-6 and C6-7. This is overall similar to the prior MRI. No significant spinal stenosis is seen. The paraspinal soft tissues are unremarkable. Carolyn THORACIC SPINE FINDINGS: Diffuse osseous metastatic disease is again seen throughout the vertebral bodies and posterior elements of the thoracic spine. There has overall been mild progression from the 07/05/2015 thoracic spine MRI. There is now near complete involvement of the T10 vertebral body. There is mildly progressive marrow replacement involving multiple mid thoracic vertebral bodies as well as T1 and T2. The T3 vertebral body remains completely involved. There is a mild T12 superior endplate compression fracture  which demonstrates slightly progressive height loss compared to the prior study. There is slightly prominent ventral epidural enhancement in the mid thoracic spine from T5 -T8, most conspicuous and mildly nodular in a left paracentral location at the T8 superior endplate level (series 37, image 33). This may reflect very small volume epidural tumor, prominent epidural venous plexus, or a combination of both. This has likely not significantly changed from the prior thoracic spine MRI, although there is much more motion artifact on axial sequences on that study. The spinal cord is normal in caliber and signal. There is no spinal cord compression. Multilevel disc degeneration is again seen with disc bulging and small disc protrusions without significant spinal canal or neural foraminal stenosis. Small left adrenal nodule and mild left upper pole renal caliectasis or cysts are partially visualized, grossly similar to the 09/07/2014 PET-CT. Carolyn LUMBAR SPINE FINDINGS: Vertebral alignment is unchanged, with trace anterolisthesis again seen of L3 on L4. Osseous lesions throughout the lumbar spine do not appear significantly changed from the prior lumbar spine MRI, with the largest lesion in the L1 vertebral body. S3 level sacral osseous metastatic disease is partially visualized and grossly similar to the prior study. 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 Lumbar vertebral body heights are preserved. There is diffuse lumbar disc desiccation. Severe disc space height loss at L4-5 is unchanged. Multilevel disc and facet degeneration does not appear significantly changed, with mild right foraminal stenosis again seen at L3-4. There is no spinal stenosis. No epidural tumor is identified. Conus medullaris is normal in signal and terminates at L1. IMPRESSION: 1. No acute intracranial abnormality. No evidence of brain metastases. 2. Numerous osseous skull metastases, progressed from 2010. 3. Widespread spinal osseous  metastatic disease, with interval progression in the cervical and thoracic spine. No sizable epidural tumor identified. Slightly prominent ventral epidural enhancement in the mid thoracic spine is favored to mostly reflect epidural venous plexus, however trace epidural tumor is possible particularly at T8. 4. No spinal stenosis. 5. Mild T12 superior endplate compression fracture with slightly progressive height loss from 07/05/2015. Electronically Signed   By: Logan Bores M.D.   On: 08/25/2015 17:08    Virgel Manifold, MD 08/25/15 1730

## 2015-08-25 NOTE — ED Notes (Addendum)
Pt reports ongoing HA, arm pain and pain underneath breastbone radiating to ribs for 2 weeks. Was told she had a lesion on her vertbra as well. Is being followed by Cancer center. Also reports R leg pain that felt better after steroid given by PCP. Pt told she needed to have an MRI due to ongoing symptoms. First available MRI was at 5pm today. Was told she could go to ED for earlier MRI.

## 2015-08-25 NOTE — ED Notes (Signed)
MD at bedside. 

## 2015-08-25 NOTE — ED Notes (Signed)
Rn will collect blood.

## 2015-08-25 NOTE — ED Notes (Signed)
Bed: [id number] Expected date:  Expected time:  Means of arrival:  Comments: Pt in MRI

## 2015-08-25 NOTE — ED Notes (Signed)
Pt transported to MRI 

## 2015-08-25 NOTE — ED Provider Notes (Signed)
CSN: [id number]     Arrival date & time 08/25/15  [id number] History   First MD Initiated Contact with Patient 08/25/15 1002     No chief complaint on file.    (Consider location/radiation/quality/duration/timing/severity/associated sxs/prior Treatment) Patient is a 70 y.o. female presenting with headaches.  Headache Pain location:  L parietal and L temporal Quality:  Sharp and stabbing Radiates to:  Does not radiate Severity currently:  6/10 Onset quality:  Gradual Duration:  8 weeks Timing:  Constant Progression:  Worsening Chronicity:  Chronic Relieved by:  None tried Worsened by:  Nothing Ineffective treatments:  None tried Associated symptoms: no cough, no eye pain and no fever     Past Medical History  Diagnosis Date  . Cancer (HCC)     RBreast, Lower back , Under l Arm  . Hypertension   . GERD (gastroesophageal reflux disease)   . Breast cancer metastasized to bone ([place]) 04/30/2011  . Radiation 01/15/2012    Lumbosacral spine 3500 cGy 14  fx  . Swollen R ankle 08/10/2012  . Fracture of ankle, medial malleolus, right, closed 08/10/2012  . Tubular adenoma 2011  . Hiatal hernia 2011  . Schatzki's ring   . Anorexia 03/02/2013  . Weight loss due to medication 03/02/2013  . C. difficile colitis   . Pneumonia    Past Surgical History  Procedure Laterality Date  . Gallbladder surgery  1991  . Mastectomy  06/28/10    bilateral- Dr. Rosebud Poles, [place]  . Inguinal hernia repair  90's    right  . Parathyroidectomy  90's  . Carpal tunnel release  90's  . Oophorectomy  90's    right  . Lymph node dissection    . Breast surgery    . Cholecystectomy    . Colonoscopy  01/08/10    Dr. [name]- anal tag,hemorrhoid o/w normal rectum, pancolonic diverticula, diminutive polyp in the base of the cecum= tubular adenoma on bx. poor prep  . Esophagogastroduodenoscopy  01/08/10    Dr. [name]- normal esophagus, small hiatal hernia, antum and body erosions, pedunculated polyp between D1  and D2, duodenal diverticulum, lymphangiectasia, second portion of the duodenum.stomach bx= inflammation, duodenum bx= adenoma  . Rotator cuff repair  08/27/11    Dr. Alphonzo Cruise- [facility]- [place] [facility]  . Esophagogastroduodenoscopy (egd) with propofol N/A 11/19/2012    RMR: non-critical Schatzki's ring s/p dilation with 31 F, multiple gastric polyps, duodenal polyp s/p piecemeal snare polypectomy, gastric polypectomy, path benign  . Maloney dilation N/A 11/19/2012    Procedure: Venia Minks DILATION;  Surgeon: Daneil Dolin, MD;  Location: [facility];  Service: Endoscopy;  Laterality: N/A;  #54  . Polypectomy N/A 11/19/2012    Procedure: POLYPECTOMY;  Surgeon: Daneil Dolin, MD;  Location: [facility];  Service: Endoscopy;  Laterality: N/A;  duodenal  . Biopsy N/A 11/19/2012    Procedure: BIOPSY;  Surgeon: Daneil Dolin, MD;  Location: [facility];  Service: Endoscopy;  Laterality: N/A;  esophageal   Family History  Problem Relation Age of Onset  . Cancer Mother     Colon  . Cancer Father     Lung, Mouth,leg   Social History  Substance Use Topics  . Smoking status: Former Smoker    Quit date: 11/22/1982  . Smokeless tobacco: Never Used     Comment: Quit smoking x 32 years  . Alcohol Use: No   OB History    No data available     Review of Systems  Constitutional: Negative for fever  and chills.  Eyes: Negative for pain.  Respiratory: Negative for cough and shortness of breath.   Endocrine: Negative for polydipsia and polyuria.  Genitourinary: Negative for flank pain and enuresis.  Neurological: Positive for headaches.       Head pain.   All other systems reviewed and are negative.     Allergies  Aspirin; Percodan; and Codeine  Home Medications   Prior to Admission medications   Medication Sig Start Date End Date Taking? Authorizing Provider  acidophilus (RISAQUAD) CAPS capsule Take 1 capsule by mouth daily. Reported on 06/20/2015    Historical Provider, MD  cholecalciferol (VITAMIN D) 1000 UNITS tablet  Take 1,000 Units by mouth daily.     Historical Provider, MD  cyclobenzaprine (FLEXERIL) 10 MG tablet Take 1 tablet (10 mg total) by mouth 2 (two) times daily as needed for muscle spasms. 04/30/15   Nat Christen, MD  cyclobenzaprine (FLEXERIL) 10 MG tablet Take 1 tablet (10 mg total) by mouth 2 (two) times daily as needed for muscle spasms. 06/22/15   Daneil Dolin, MD  dexamethasone (DECADRON) 4 MG tablet Take 2 tablets (8 mg total) by mouth 2 (two) times daily with a meal. 08/24/15   Chauncey Cruel, MD  feeding supplement (BOOST / RESOURCE BREEZE) LIQD Take 1 Container by mouth 2 (two) times daily between meals. Patient taking differently: Take 1 Container by mouth 2 (two) times daily between meals. Drinks one when she can't eat 01/28/15   Samuella Cota, MD  fluticasone [facility]) 50 MCG/ACT nasal spray Place 2 sprays into both nostrils daily as needed for allergies or rhinitis. Reported on 06/20/2015 12/08/14   Historical Provider, MD  HYDROcodone-acetaminophen (NORCO) 10-325 MG tablet Take 1-2 tablets by mouth every 4 (four) hours as needed for moderate pain or severe pain. 05/11/15   Chauncey Cruel, MD  hyoscyamine (ANASPAZ) 0.125 MG TBDP disintergrating tablet DISSOLVE 1 TABLET(0.125 MG) UNDER THE TONGUE FOUR TIMES DAILY BEFORE MEALS AND AT BEDTIME AS NEEDED FOR DIARRHEA OR ABDOMINAL CRAMPS 06/06/15   Orvil Feil, NP  ibandronate (BONIVA) 150 MG tablet Take 1 tablet (150 mg total) by mouth every 30 (thirty) days. Take in the morning with a full glass of water, on an empty stomach, and do not take anything else by mouth or lie down for the next 30 min. Patient not taking: Reported on 06/20/2015 05/11/15   Chauncey Cruel, MD  letrozole [facility]) 2.5 MG tablet Take 1 tablet (2.5 mg total) by mouth daily. 08/23/15   Chauncey Cruel, MD  lisinopril (PRINIVIL,ZESTRIL) 20 MG tablet Take 10 mg by mouth daily. 06/20/14   Historical Provider, MD  mometasone (ELOCON) 0.1 % lotion apply topically daily as  needed for itching 10/17/14   Historical Provider, MD  morphine (MSIR) 15 MG tablet Take 15 mg by mouth. Once every 12 hours as needed    Historical Provider, MD  ondansetron (ZOFRAN-ODT) 8 MG disintegrating tablet DISSOLVE 1 TABLET ON THE TONGUE EVERY 8 HOURS AS NEEDED FOR NAUSEA 02/09/15   Chauncey Cruel, MD  palbociclib [facility]) 125 MG capsule Take 1 capsule (125 mg total) by mouth daily. Take 21days out of 28 with Letrozole, with food 06/13/15   Chauncey Cruel, MD  Polyethyl Glycol-Propyl Glycol (SYSTANE OP) Apply 1 drop to eye daily as needed (dry eyes).    Historical Provider, MD  warfarin (COUMADIN) 4 MG tablet Take 1 tablet (4 mg total) by mouth daily. Patient taking differently: Take 2-4 mg by  mouth See admin instructions. Take 2 mg on Monday, Wednesday and Friday.  Take 4 mg all other days. 02/28/15   Chauncey Cruel, MD   There were no vitals taken for this visit. Physical Exam  Constitutional: She appears well-developed and well-nourished.  HENT:  Head: Normocephalic and atraumatic.  Neck: Normal range of motion.  Cardiovascular: Normal rate and regular rhythm.   Pulmonary/Chest: No stridor. No respiratory distress.  Abdominal: She exhibits no distension.  Neurological: She is alert.  No altered mental status, able to give full seemingly accurate history.  Face is symmetric, EOM's intact, pupils equal and reactive, vision intact, tongue and uvula midline without deviation Upper and Lower extremity motor 5/5, intact pain perception in distal extremities but decreased light touch in RLE, 1+ reflexes in biceps, patella and achilles tendons. Finger to nose normal, heel to shin normal. Walks without assistance or evident ataxia.    Nursing note and vitals reviewed.   ED Course  Procedures (including critical care time) Labs Review Labs Reviewed - No data to display  Imaging Review No results found. I have personally reviewed and evaluated these images and lab results as  part of my medical decision-making.   EKG Interpretation None      MDM   Final diagnoses:  None    H/o cancer, worsening head pain, new radiculopathic symptoms to include numbness in right leg and paresthesias/numbness in LUE. Concern for spinal cord compression or brain metastasis so MRI will be done for both. Pain control in the mean time.   Car transferred pending MRI results, patient updated prior to leaving.   Merrily Pew, MD 08/26/15 [phone]

## 2015-08-28 ENCOUNTER — Other Ambulatory Visit: Payer: Self-pay | Admitting: Oncology

## 2015-08-29 ENCOUNTER — Other Ambulatory Visit: Payer: Self-pay | Admitting: *Deleted

## 2015-08-29 DIAGNOSIS — C50919 Malignant neoplasm of unspecified site of unspecified female breast: Secondary | ICD-10-CM

## 2015-08-29 DIAGNOSIS — C7951 Secondary malignant neoplasm of bone: Principal | ICD-10-CM

## 2015-08-29 MED ORDER — DEXAMETHASONE 4 MG PO TABS: 4.0000 mg | ORAL_TABLET | Freq: Two times a day (BID) | ORAL | Status: DC

## 2015-08-29 NOTE — Telephone Encounter (Signed)
This RN per MD review of films from ER visit as well as per pt her primary area of discomfort is more in her skull lesions. She has noticed improvement in pain control and function since starting the decadron.  Per MD referral can be made to known rad onc MD - Dr Sondra Come , ideally on same day as appointment with him 5/10 for pt convenience.  Decadron dose to be tapered to 1 tab bid ( was on 2 bid ).  This RN will contact pt with above.

## 2015-08-30 ENCOUNTER — Telehealth: Payer: Self-pay | Admitting: Radiation Oncology

## 2015-08-30 NOTE — Telephone Encounter (Signed)
Left message for patient to let me know which location she prefers to attend for her consult

## 2015-08-31 ENCOUNTER — Ambulatory Visit (HOSPITAL_COMMUNITY): Payer: Medicare PPO

## 2015-08-31 ENCOUNTER — Ambulatory Visit (HOSPITAL_COMMUNITY): Admission: RE | Admit: 2015-08-31 | Payer: Medicare PPO | Source: Ambulatory Visit

## 2015-08-31 ENCOUNTER — Encounter (HOSPITAL_COMMUNITY)
Admission: RE | Admit: 2015-08-31 | Discharge: 2015-08-31 | Disposition: A | Discharge status: Home / Self Care | Payer: Medicare PPO | Source: Ambulatory Visit | Attending: Oncology | Admitting: Oncology

## 2015-08-31 DIAGNOSIS — C50919 Malignant neoplasm of unspecified site of unspecified female breast: Secondary | ICD-10-CM

## 2015-08-31 DIAGNOSIS — C50811 Malignant neoplasm of overlapping sites of right female breast: Secondary | ICD-10-CM | POA: Diagnosis present

## 2015-08-31 DIAGNOSIS — C50112 Malignant neoplasm of central portion of left female breast: Secondary | ICD-10-CM | POA: Diagnosis present

## 2015-08-31 DIAGNOSIS — C50812 Malignant neoplasm of overlapping sites of left female breast: Secondary | ICD-10-CM | POA: Insufficient documentation

## 2015-08-31 DIAGNOSIS — C7951 Secondary malignant neoplasm of bone: Secondary | ICD-10-CM | POA: Insufficient documentation

## 2015-08-31 LAB — GLUCOSE, CAPILLARY: Glucose-Capillary: 103 mg/dL — ABNORMAL HIGH (ref 65–99)

## 2015-08-31 MED ORDER — FLUDEOXYGLUCOSE F - 18 (FDG) INJECTION
6.3000 | Freq: Once | INTRAVENOUS | Status: AC | PRN
  Administered 2015-08-31: 6.3 via INTRAVENOUS

## 2015-09-05 NOTE — Progress Notes (Signed)
[place]  [phone] [phone]     ID: [name] OB: [birth year]  MR#: [id number]  PYP#:[id number]  PCP: Curlene Labrum, MD GYN:  Gari Crown  SU:  OTHER MD: Gery Pray, Sherre Poot  CHIEF COMPLAINT: stage IV breast cancer  CURRENT TREATMENT: letrozole + palbocilcib [name]); denosumab  BREAST CANCER HISTORY: From Dr. Adria Dill summary 07/14/2013::  "Complex 70 year old woman with history of ER-positive and ER negative metachronous primary bilateral breast cancers.  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 Progression in bone while on tamoxifen September 2011. Initial complete response to monthly Faslodex injections.She developed a malignant left axillary lymph node with no clear primary in the left breast November 2011. This tumor was ER negative. HER-2 negative. While under evaluation for this lesion she developed a new lesion in the right breast which was ER positive. She underwent bilateral mastectomies 06/28/2010. She was continued on hormonal therapy with monthly Faslodex injections in view of the ER positive component of her tumor with a complete response to this hormone in bone. She underwent radiation to the left chest and axilla subsequent to the mastectomies. She developed a cutaneous recurrence on the right chest wall along the medial aspect of the right mastectomy scar, biopsy proven, initially detected on clinical exam by her radiation oncologist on 02/14/2011. She was treated with electron beam radiation.. 3000 cGy in 15 fractions through December 2012. Further progression in bone in August 2013. Palliative radiation to lumbosacral spine September through October 2013. Brief trial of Xeloda November 2013 through January 2014.  Change to tamoxifen plus [employer] February 2014 stopped for progression and 25 pound weight loss November 2014. Trial of Megace 80 mg twice a day with improved appetite but progressive rise in tumor marker and progressive low back pain with further progression on MRI of the spine done January 2015. Radiation to the lumbar spine February 2015. She started most recent salvage regimen with a combination of Ibrance plus Femara on 07/08/2013. It is too early to assess response."  The patient's breast cancer history is summarized in detailed below   INTERVAL HISTORY: [name] returns today for follow-up of her metastatic estrogen receptor positive breast cancer, accompanied by her friend (whom she calls her "mom"). Sherryhas been having more pain problems and on 08/25/2015 she was restaged with MRIs of the cervical, thoracic and lumbar spines as well as a brain MRI with and without contrast. The brain MRI showed no central nervous system involvement. However there were many skull metastases which appear to have progressed as compared to 2010 (7 years ago). MRI of the spine showed progression in the cervical spine as compared to 2015 with now complete marrow replacement of C4 but no epidural tumor or spinal cord compression. There is also significant degenerative disease without spinal stenosis.  In the thoracic spine there has been some progression as compared to March 2017. There was complete involvement of T10 and progressive marrow replacement of T1-T2. There is also further loss of height of the T12 superior endplate compression fracture previously noted. There was ventral epidural enhancement from T5-T8 not changed significantly since the prior scan. There was no evidence of spinal cord compression. Again significant degenerative disease was noted. MRI of the lumbar spine was basically stable.  [name] has tolerated the letrozole and palbociclib well, but the cancer is progressing through  these agents and were  going to have to make a change.Marland Kitchen  REVIEW OF SYSTEMS: Her pain is moderately well controlled on hydrocodone, which she takes 4 or 5 times a day, and morphine 15 mg tablets of which she takes one half at night. She does not take oxycodone. She has stopped taking the Flexeril. She is mildly constipated but handles that "okay" with stool softeners. She continues to work full-time. She worries about a tremor in particularly worse at night affect her work. Her fingertips and nails are "worn out". She has a sore throat. She has intermittent fevers. She feels forgetful and anxious but denies depression. She continues on Coumadin, with no evidence of bleeding or unusual bruising A detailed review of systems today was otherwise stable.  PAST MEDICAL HISTORY: Past Medical History  Diagnosis Date  . Cancer (HCC)     RBreast, Lower back , Under l Arm  . Hypertension   . GERD (gastroesophageal reflux disease)   . Breast cancer metastasized to bone ([facility]) 04/30/2011  . Radiation 01/15/2012    Lumbosacral spine 3500 cGy 14  fx  . Swollen R ankle 08/10/2012  . Fracture of ankle, medial malleolus, right, closed 08/10/2012  . Tubular adenoma 2011  . Hiatal hernia 2011  . Schatzki's ring   . Anorexia 03/02/2013  . Weight loss due to medication 03/02/2013  . C. difficile colitis   . Pneumonia   . 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 Gallbladder surgery  1991  . Mastectomy  06/28/10    bilateral- Dr. Rosebud Poles, [place]  . Inguinal hernia repair  90's    right  . Parathyroidectomy  90's  . Carpal tunnel release  90's  . Oophorectomy  90's    right  . Lymph node dissection    . Breast surgery    . Cholecystectomy    . Colonoscopy  01/08/10    Dr. [name]- anal tag,hemorrhoid o/w normal rectum, pancolonic diverticula, diminutive polyp in the base of the cecum= tubular adenoma on bx. poor prep  . Esophagogastroduodenoscopy  01/08/10     Dr. [name]- normal esophagus, small hiatal hernia, antum and body erosions, pedunculated polyp between D1 and D2, duodenal diverticulum, lymphangiectasia, second portion of the duodenum.stomach bx= inflammation, duodenum bx= adenoma  . Rotator cuff repair  08/27/11    Dr. Alphonzo Cruise- [facility]- [place] [facility]  . Esophagogastroduodenoscopy (egd) with propofol N/A 11/19/2012    RMR: non-critical Schatzki's ring s/p dilation with 19 F, multiple gastric polyps, duodenal polyp s/p piecemeal snare polypectomy, gastric polypectomy, path benign  . Maloney dilation N/A 11/19/2012    Procedure: Venia Minks DILATION;  Surgeon: Daneil Dolin, MD;  Location: [facility];  Service: Endoscopy;  Laterality: N/A;  #[id number]  . Polypectomy N/A 11/19/2012    Procedure: POLYPECTOMY;  Surgeon: Daneil Dolin, MD;  Location: [facility];  Service: Endoscopy;  Laterality: N/A;  duodenal  . Biopsy N/A 11/19/2012    Procedure: BIOPSY;  Surgeon: Daneil Dolin, MD;  Location: [facility];  Service: Endoscopy;  Laterality: N/A;  esophageal    FAMILY HISTORY Family History  Problem Relation Age of Onset  . Cancer Mother     Colon  . Cancer Father     Lung, Mouth,leg   the patient's father died at the age of 29 from metastatic head and neck carcinoma. The patient's mother died of breast cancer the age of 17. The patient  had 2 brothers, both with prostate cancer. She has 4 sisters, one diagnosed with breast cancer at the age of 15, another 1 recent underwent bilateral mastectomies. Another one died from colon cancer.   GYNECOLOGIC HISTORY:   menarche age 58, she is GX P0. She underwent menopause at age 54, status post HRT for 12 years. She status post right salpingo-oophorectomy.   SOCIAL HISTORY:  She works at [employer] in [place]. Her husband died at [facility] [place] from complications of surgery after a 7-1/2 month hospitalization) 2008). She lives by herself, with no pets. She tells me she works 9 hours a day 5 days a week.     ADVANCED  DIRECTIVES: In place   HEALTH MAINTENANCE: Social History  Substance Use Topics  . Smoking status: Former Smoker    Quit date: 11/22/1982  . Smokeless tobacco: Never Used     Comment: Quit smoking x 32 years  . Alcohol Use: No     Colonoscopy:  PAP:  Bone density:  Lipid panel:  Allergies  Allergen Reactions  . Aspirin   . Percodan [Oxycodone-Aspirin] Nausea Only  . Codeine Nausea Only    Current Outpatient Prescriptions  Medication Sig Dispense Refill  . carvedilol (COREG) 3.125 MG tablet Take 1 tablet (3.125 mg total) by mouth 2 (two) times daily with a meal. 60 tablet 3  . dexamethasone (DECADRON) 4 MG tablet Take 1 tablet (4 mg total) by mouth 2 (two) times daily with a meal. 20 tablet 0  . feeding supplement (BOOST / RESOURCE BREEZE) LIQD Take 1 Container by mouth 2 (two) times daily between meals.  0  . HYDROcodone-acetaminophen (NORCO) 10-325 MG tablet Take 1-2 tablets by mouth every 4 (four) hours as needed for moderate pain or severe pain. 360 tablet 0  . hyoscyamine (ANASPAZ) 0.125 MG TBDP disintergrating tablet DISSOLVE 1 TABLET(0.125 MG) UNDER THE TONGUE FOUR TIMES DAILY BEFORE MEALS AND AT BEDTIME AS NEEDED FOR DIARRHEA OR ABDOMINAL CRAMPS 360 tablet 3  . lisinopril (PRINIVIL,ZESTRIL) 20 MG tablet Take 10 mg by mouth every evening.   3  . loperamide (IMODIUM A-D) 2 MG tablet Take 2 mg by mouth 4 (four) times daily as needed for diarrhea or loose stools.    . mometasone (ELOCON) 0.1 % lotion apply topically daily as needed for pain  5  . morphine (MSIR) 15 MG tablet Take 1 tablet (15 mg total) by mouth every 12 (twelve) hours as needed for severe pain. Once every 12 hours as needed 60 tablet 0  . ondansetron (ZOFRAN-ODT) 8 MG disintegrating tablet DISSOLVE 1 TABLET ON THE TONGUE EVERY 8 HOURS AS NEEDED FOR NAUSEA 20 tablet 4  . Polyethyl Glycol-Propyl Glycol (SYSTANE OP) Apply 1 drop to eye daily as needed (dry eyes).    . warfarin (COUMADIN) 4 MG tablet Take 1  tablet (4 mg total) by mouth daily. (Patient taking differently: Take 2-4 mg by mouth daily. Take 2 mg on Mon thru Thurs & Take 4 mg on Fri, Sat, Sun) 90 tablet 4   No current facility-administered medications for this visit.    OBJECTIVE: Middle-aged white woman who Appears stated age 57 Vitals:   09/06/15 1040  BP: 134/79  Pulse: 92  Temp: 98 F (36.7 C)  Resp: 18     Body mass index is 23.71 kg/(m^2).    ECOG FS:1 - Symptomatic but completely ambulatory Filed Vitals:   09/06/15 1040  BP: 134/79  Pulse: 92  Temp: 98 F (36.7 C)  Resp: 18   Sclerae unicteric, pupils round and equal Oropharynx clear and moist-- no thrush or other lesions No cervical or supraclavicular adenopathy Lungs no rales or rhonchi Heart regular rate and rhythm Abd soft, nontender, positive bowel sounds MSK no focal spinal tenderness, no upper extremity lymphedema Neuro: nonfocal, well oriented, agitated affect Breasts: Deferred  LAB RESULTS: CBC    Component Value Date/Time   WBC 7.1 09/06/2015 1023   WBC 4.9 Sep 15, 2015 1153   RBC 3.02* 09/06/2015 1023   RBC 2.66* 09-15-2015 1153   HGB 10.6* 09/06/2015 1023   HGB 9.1* 09/15/15 1153   HCT 32.2* 09/06/2015 1023   HCT 27.4* 09/15/2015 1153   PLT 245 09/06/2015 1023   PLT 180 Sep 15, 2015 1153   MCV 106.8* 09/06/2015 1023   MCV 103.0* 2015/09/15 1153   MCH 35.2* 09/06/2015 1023   MCH 34.2* 09/15/15 1153   MCHC 33.0 09/06/2015 1023   MCHC 33.2 2015-09-15 1153   RDW 18.8* 09/06/2015 1023   RDW 16.1* Sep 15, 2015 1153   LYMPHSABS 0.1* 09/06/2015 1023   LYMPHSABS 0.2* 15-Sep-2015 1153   MONOABS 0.7 09/06/2015 1023   MONOABS 0.1 2015/09/15 1153   EOSABS 0.0 09/06/2015 1023   EOSABS 0.0 09-15-15 1153   BASOSABS 0.0 09/06/2015 1023   BASOSABS 0.0 Sep 15, 2015 1153      Recent Labs Lab 09/06/15 1049  INR 9.01*    Urinalysis    Component Value Date/Time   COLORURINE YELLOW 01/23/2015 [place] 01/23/2015 1830    LABSPEC 1.010 01/23/2015 1830   PHURINE 6.0 01/23/2015 1830   GLUCOSEU NEGATIVE 01/23/2015 1830   HGBUR LARGE* 01/23/2015 1830   BILIRUBINUR NEGATIVE 01/23/2015 [place] 01/23/2015 1830   PROTEINUR TRACE* 01/23/2015 1830   UROBILINOGEN 0.2 01/23/2015 1830   NITRITE NEGATIVE 01/23/2015 1830   LEUKOCYTESUR NEGATIVE 01/23/2015 1830    STUDIES: Mr Jeri Cos Wo Contrast  2015-09-15  CLINICAL DATA:  Ongoing headache. Arm pain and pain beneath the sternum and ribs for 2 weeks. Right leg pain. Metastatic breast cancer. EXAM: MRI HEAD WITHOUT AND WITH CONTRAST; MRI CERVICAL SPINE WITHOUT AND WITH CONTRAST; MRI LUMBAR SPINE WITHOUT AND WITH CONTRAST; MRI THORACIC SPINE WITHOUT AND WITH CONTRAST TECHNIQUE: Multiplanar, multiecho pulse sequences of the brain and surrounding structures were obtained according to standard protocol without and with intravenous contrast; Multiplanar and multiecho pulse sequences of the cervical spine, to include the craniocervical junction and cervicothoracic junction, were obtained according to standard protocol without and with intravenous contrast.; Multiplanar and multiecho pulse sequences of the lumbar spine were obtained without and with intravenous contrast.; Multiplanar and multiecho pulse sequences of the thoracic spine were obtained without and with intravenous contrast. CONTRAST:  39m MULTIHANCE GADOBENATE DIMEGLUMINE 529 MG/ML IV SOLN COMPARISON:  Head CT 08/05/2015 and MRI 02/14/2009. Cervical spine MRI 05/14/2013. Thoracic and lumbar spine MRI 07/05/2015. FINDINGS: MR HEAD FINDINGS: There is no evidence of acute infarct, intracranial hemorrhage, mass, midline shift, or extra-axial fluid collection. There is mild cerebral atrophy. Small foci of T2 hyperintensity in the cerebral white matter bilaterally, stable to slightly increased from 2010 and nonspecific but compatible with mild-to-moderate chronic small vessel ischemic disease. No enhancing brain  lesions are identified. There is at most minimally prominent dural enhancement without nodularity. Small enhancing lesions are present throughout the skull, progressive from 2010 (for example 3 cm left parietal lesion on image 28 of series 41). There is involvement of the clivus. Prior bilateral cataract extraction is noted. There is  a trace left mastoid effusion. No significant paranasal sinus inflammatory disease. Major intracranial vascular flow voids are preserved. MR CERVICAL SPINE FINDINGS: Cervical spine straightening is unchanged. There is no significant listhesis. There is progressive osseous metastatic disease throughout the cervical spine since the 2015 MRI. There is now complete marrow replacement involving the C4 vertebral body with patchy enhancement including a 1 cm focus posteriorly. There is also a left-sided posterior element involvement at C4 which is new. No epidural tumor or spinal cord compression is identified. No cervical spinal cord signal abnormality is identified within limitations of motion artifact. Diffuse cervical disc degeneration is again seen. There is mild neural foraminal stenosis on the right at C3-4, on the left at C4-5, and likely bilaterally at C5-6 and C6-7. This is overall similar to the prior MRI. No significant spinal stenosis is seen. The paraspinal soft tissues are unremarkable. MR THORACIC SPINE FINDINGS: Diffuse osseous metastatic disease is again seen throughout the vertebral bodies and posterior elements of the thoracic spine. There has overall been mild progression from the 07/05/2015 thoracic spine MRI. There is now near complete involvement of the T10 vertebral body. There is mildly progressive marrow replacement involving multiple mid thoracic vertebral bodies as well as T1 and T2. The T3 vertebral body remains completely involved. There is a mild T12 superior endplate compression fracture which demonstrates slightly progressive height loss compared to the prior  study. There is slightly prominent ventral epidural enhancement in the mid thoracic spine from T5 -T8, most conspicuous and mildly nodular in a left paracentral location at the T8 superior endplate level (series 37, image 33). This may reflect very small volume epidural tumor, prominent epidural venous plexus, or a combination of both. This has likely not significantly changed from the prior thoracic spine MRI, although there is much more motion artifact on axial sequences on that study. The spinal cord is normal in caliber and signal. There is no spinal cord compression. Multilevel disc degeneration is again seen with disc bulging and small disc protrusions without significant spinal canal or neural foraminal stenosis. Small left adrenal nodule and mild left upper pole renal caliectasis or cysts are partially visualized, grossly similar to the 09/07/2014 PET-CT. MR LUMBAR SPINE FINDINGS: Vertebral alignment is unchanged, with trace anterolisthesis again seen of L3 on L4. Osseous lesions throughout the lumbar spine do not appear significantly changed from the prior lumbar spine MRI, with the largest lesion in the L1 vertebral body. S3 level sacral osseous metastatic disease is partially visualized and grossly similar to the prior study. 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 Lumbar vertebral body heights are preserved. There is diffuse lumbar disc desiccation. Severe disc space height loss at L4-5 is unchanged. Multilevel disc and facet degeneration does not appear significantly changed, with mild right foraminal stenosis again seen at L3-4. There is no spinal stenosis. No epidural tumor is identified. Conus medullaris is normal in signal and terminates at L1. IMPRESSION: 1. No acute intracranial abnormality. No evidence of brain metastases. 2. Numerous osseous skull metastases, progressed from 2010. 3. Widespread spinal osseous metastatic disease, with interval progression in the cervical and thoracic  spine. No sizable epidural tumor identified. Slightly prominent ventral epidural enhancement in the mid thoracic spine is favored to mostly reflect epidural venous plexus, however trace epidural tumor is possible particularly at T8. 4. No spinal stenosis. 5. Mild T12 superior endplate compression fracture with slightly progressive height loss from 07/05/2015. Electronically Signed   By: Logan Bores M.D.   On:  08/25/2015 17:08   Mr Cervical Spine W Wo Contrast  08/25/2015  CLINICAL DATA:  Ongoing headache. Arm pain and pain beneath the sternum and ribs for 2 weeks. Right leg pain. Metastatic breast cancer. EXAM: MRI HEAD WITHOUT AND WITH CONTRAST; MRI CERVICAL SPINE WITHOUT AND WITH CONTRAST; MRI LUMBAR SPINE WITHOUT AND WITH CONTRAST; MRI THORACIC SPINE WITHOUT AND WITH CONTRAST TECHNIQUE: Multiplanar, multiecho pulse sequences of the brain and surrounding structures were obtained according to standard protocol without and with intravenous contrast; Multiplanar and multiecho pulse sequences of the cervical spine, to include the craniocervical junction and cervicothoracic junction, were obtained according to standard protocol without and with intravenous contrast.; Multiplanar and multiecho pulse sequences of the lumbar spine were obtained without and with intravenous contrast.; Multiplanar and multiecho pulse sequences of the thoracic spine were obtained without and with intravenous contrast. CONTRAST:  28m MULTIHANCE GADOBENATE DIMEGLUMINE 529 MG/ML IV SOLN COMPARISON:  Head CT 08/05/2015 and MRI 02/14/2009. Cervical spine MRI 05/14/2013. Thoracic and lumbar spine MRI 07/05/2015. FINDINGS: MR HEAD FINDINGS: There is no evidence of acute infarct, intracranial hemorrhage, mass, midline shift, or extra-axial fluid collection. There is mild cerebral atrophy. Small foci of T2 hyperintensity in the cerebral white matter bilaterally, stable to slightly increased from 2010 and nonspecific but compatible with  mild-to-moderate chronic small vessel ischemic disease. No enhancing brain lesions are identified. There is at most minimally prominent dural enhancement without nodularity. Small enhancing lesions are present throughout the skull, progressive from 2010 (for example 3 cm left parietal lesion on image 28 of series 41). There is involvement of the clivus. Prior bilateral cataract extraction is noted. There is a trace left mastoid effusion. No significant paranasal sinus inflammatory disease. Major intracranial vascular flow voids are preserved. MR CERVICAL SPINE FINDINGS: Cervical spine straightening is unchanged. There is no significant listhesis. There is progressive osseous metastatic disease throughout the cervical spine since the 2015 MRI. There is now complete marrow replacement involving the C4 vertebral body with patchy enhancement including a 1 cm focus posteriorly. There is also a left-sided posterior element involvement at C4 which is new. No epidural tumor or spinal cord compression is identified. No cervical spinal cord signal abnormality is identified within limitations of motion artifact. Diffuse cervical disc degeneration is again seen. There is mild neural foraminal stenosis on the right at C3-4, on the left at C4-5, and likely bilaterally at C5-6 and C6-7. This is overall similar to the prior MRI. No significant spinal stenosis is seen. The paraspinal soft tissues are unremarkable. MR THORACIC SPINE FINDINGS: Diffuse osseous metastatic disease is again seen throughout the vertebral bodies and posterior elements of the thoracic spine. There has overall been mild progression from the 07/05/2015 thoracic spine MRI. There is now near complete involvement of the T10 vertebral body. There is mildly progressive marrow replacement involving multiple mid thoracic vertebral bodies as well as T1 and T2. The T3 vertebral body remains completely involved. There is a mild T12 superior endplate compression fracture  which demonstrates slightly progressive height loss compared to the prior study. There is slightly prominent ventral epidural enhancement in the mid thoracic spine from T5 -T8, most conspicuous and mildly nodular in a left paracentral location at the T8 superior endplate level (series 37, image 33). This may reflect very small volume epidural tumor, prominent epidural venous plexus, or a combination of both. This has likely not significantly changed from the prior thoracic spine MRI, although there is much more motion artifact on axial sequences on that  study. The spinal cord is normal in caliber and signal. There is no spinal cord compression. Multilevel disc degeneration is again seen with disc bulging and small disc protrusions without significant spinal canal or neural foraminal stenosis. Small left adrenal nodule and mild left upper pole renal caliectasis or cysts are partially visualized, grossly similar to the 09/07/2014 PET-CT. MR LUMBAR SPINE FINDINGS: Vertebral alignment is unchanged, with trace anterolisthesis again seen of L3 on L4. Osseous lesions throughout the lumbar spine do not appear significantly changed from the prior lumbar spine MRI, with the largest lesion in the L1 vertebral body. S3 level sacral osseous metastatic disease is partially visualized and grossly similar to the prior study. 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 Lumbar vertebral body heights are preserved. There is diffuse lumbar disc desiccation. Severe disc space height loss at L4-5 is unchanged. Multilevel disc and facet degeneration does not appear significantly changed, with mild right foraminal stenosis again seen at L3-4. There is no spinal stenosis. No epidural tumor is identified. Conus medullaris is normal in signal and terminates at L1. IMPRESSION: 1. No acute intracranial abnormality. No evidence of brain metastases. 2. Numerous osseous skull metastases, progressed from 2010. 3. Widespread spinal osseous  metastatic disease, with interval progression in the cervical and thoracic spine. No sizable epidural tumor identified. Slightly prominent ventral epidural enhancement in the mid thoracic spine is favored to mostly reflect epidural venous plexus, however trace epidural tumor is possible particularly at T8. 4. No spinal stenosis. 5. Mild T12 superior endplate compression fracture with slightly progressive height loss from 07/05/2015. Electronically Signed   By: Sebastian Ache M.D.   On: 08/25/2015 17:08   Mr Thoracic Spine W Wo Contrast  08/25/2015  CLINICAL DATA:  Ongoing headache. Arm pain and pain beneath the sternum and ribs for 2 weeks. Right leg pain. Metastatic breast cancer. EXAM: MRI HEAD WITHOUT AND WITH CONTRAST; MRI CERVICAL SPINE WITHOUT AND WITH CONTRAST; MRI LUMBAR SPINE WITHOUT AND WITH CONTRAST; MRI THORACIC SPINE WITHOUT AND WITH CONTRAST TECHNIQUE: Multiplanar, multiecho pulse sequences of the brain and surrounding structures were obtained according to standard protocol without and with intravenous contrast; Multiplanar and multiecho pulse sequences of the cervical spine, to include the craniocervical junction and cervicothoracic junction, were obtained according to standard protocol without and with intravenous contrast.; Multiplanar and multiecho pulse sequences of the lumbar spine were obtained without and with intravenous contrast.; Multiplanar and multiecho pulse sequences of the thoracic spine were obtained without and with intravenous contrast. CONTRAST:  17mL MULTIHANCE GADOBENATE DIMEGLUMINE 529 MG/ML IV SOLN COMPARISON:  Head CT 08/05/2015 and MRI 02/14/2009. Cervical spine MRI 05/14/2013. Thoracic and lumbar spine MRI 07/05/2015. FINDINGS: MR HEAD FINDINGS: There is no evidence of acute infarct, intracranial hemorrhage, mass, midline shift, or extra-axial fluid collection. There is mild cerebral atrophy. Small foci of T2 hyperintensity in the cerebral white matter bilaterally, stable to  slightly increased from 2010 and nonspecific but compatible with mild-to-moderate chronic small vessel ischemic disease. No enhancing brain lesions are identified. There is at most minimally prominent dural enhancement without nodularity. Small enhancing lesions are present throughout the skull, progressive from 2010 (for example 3 cm left parietal lesion on image 28 of series 41). There is involvement of the clivus. Prior bilateral cataract extraction is noted. There is a trace left mastoid effusion. No significant paranasal sinus inflammatory disease. Major intracranial vascular flow voids are preserved. MR CERVICAL SPINE FINDINGS: Cervical spine straightening is unchanged. There is no significant listhesis. There is progressive osseous  metastatic disease throughout the cervical spine since the 2015 MRI. There is now complete marrow replacement involving the C4 vertebral body with patchy enhancement including a 1 cm focus posteriorly. There is also a left-sided posterior element involvement at C4 which is new. No epidural tumor or spinal cord compression is identified. No cervical spinal cord signal abnormality is identified within limitations of motion artifact. Diffuse cervical disc degeneration is again seen. There is mild neural foraminal stenosis on the right at C3-4, on the left at C4-5, and likely bilaterally at C5-6 and C6-7. This is overall similar to the prior MRI. No significant spinal stenosis is seen. The paraspinal soft tissues are unremarkable. MR THORACIC SPINE FINDINGS: Diffuse osseous metastatic disease is again seen throughout the vertebral bodies and posterior elements of the thoracic spine. There has overall been mild progression from the 07/05/2015 thoracic spine MRI. There is now near complete involvement of the T10 vertebral body. There is mildly progressive marrow replacement involving multiple mid thoracic vertebral bodies as well as T1 and T2. The T3 vertebral body remains completely  involved. There is a mild T12 superior endplate compression fracture which demonstrates slightly progressive height loss compared to the prior study. There is slightly prominent ventral epidural enhancement in the mid thoracic spine from T5 -T8, most conspicuous and mildly nodular in a left paracentral location at the T8 superior endplate level (series 37, image 33). This may reflect very small volume epidural tumor, prominent epidural venous plexus, or a combination of both. This has likely not significantly changed from the prior thoracic spine MRI, although there is much more motion artifact on axial sequences on that study. The spinal cord is normal in caliber and signal. There is no spinal cord compression. Multilevel disc degeneration is again seen with disc bulging and small disc protrusions without significant spinal canal or neural foraminal stenosis. Small left adrenal nodule and mild left upper pole renal caliectasis or cysts are partially visualized, grossly similar to the 09/07/2014 PET-CT. MR LUMBAR SPINE FINDINGS: Vertebral alignment is unchanged, with trace anterolisthesis again seen of L3 on L4. Osseous lesions throughout the lumbar spine do not appear significantly changed from the prior lumbar spine MRI, with the largest lesion in the L1 vertebral body. S3 level sacral osseous metastatic disease is partially visualized and grossly similar to the prior study. 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 Lumbar vertebral body heights are preserved. There is diffuse lumbar disc desiccation. Severe disc space height loss at L4-5 is unchanged. Multilevel disc and facet degeneration does not appear significantly changed, with mild right foraminal stenosis again seen at L3-4. There is no spinal stenosis. No epidural tumor is identified. Conus medullaris is normal in signal and terminates at L1. IMPRESSION: 1. No acute intracranial abnormality. No evidence of brain metastases. 2. Numerous osseous  skull metastases, progressed from 2010. 3. Widespread spinal osseous metastatic disease, with interval progression in the cervical and thoracic spine. No sizable epidural tumor identified. Slightly prominent ventral epidural enhancement in the mid thoracic spine is favored to mostly reflect epidural venous plexus, however trace epidural tumor is possible particularly at T8. 4. No spinal stenosis. 5. Mild T12 superior endplate compression fracture with slightly progressive height loss from 07/05/2015. Electronically Signed   By: Logan Bores M.D.   On: 08/25/2015 17:08   Mr Lumbar Spine W Wo Contrast  08/25/2015  CLINICAL DATA:  Ongoing headache. Arm pain and pain beneath the sternum and ribs for 2 weeks. Right leg pain. Metastatic breast cancer.  EXAM: MRI HEAD WITHOUT AND WITH CONTRAST; MRI CERVICAL SPINE WITHOUT AND WITH CONTRAST; MRI LUMBAR SPINE WITHOUT AND WITH CONTRAST; MRI THORACIC SPINE WITHOUT AND WITH CONTRAST TECHNIQUE: Multiplanar, multiecho pulse sequences of the brain and surrounding structures were obtained according to standard protocol without and with intravenous contrast; Multiplanar and multiecho pulse sequences of the cervical spine, to include the craniocervical junction and cervicothoracic junction, were obtained according to standard protocol without and with intravenous contrast.; Multiplanar and multiecho pulse sequences of the lumbar spine were obtained without and with intravenous contrast.; Multiplanar and multiecho pulse sequences of the thoracic spine were obtained without and with intravenous contrast. CONTRAST:  73m MULTIHANCE GADOBENATE DIMEGLUMINE 529 MG/ML IV SOLN COMPARISON:  Head CT 08/05/2015 and MRI 02/14/2009. Cervical spine MRI 05/14/2013. Thoracic and lumbar spine MRI 07/05/2015. FINDINGS: MR HEAD FINDINGS: There is no evidence of acute infarct, intracranial hemorrhage, mass, midline shift, or extra-axial fluid collection. There is mild cerebral atrophy. Small foci of T2  hyperintensity in the cerebral white matter bilaterally, stable to slightly increased from 2010 and nonspecific but compatible with mild-to-moderate chronic small vessel ischemic disease. No enhancing brain lesions are identified. There is at most minimally prominent dural enhancement without nodularity. Small enhancing lesions are present throughout the skull, progressive from 2010 (for example 3 cm left parietal lesion on image 28 of series 41). There is involvement of the clivus. Prior bilateral cataract extraction is noted. There is a trace left mastoid effusion. No significant paranasal sinus inflammatory disease. Major intracranial vascular flow voids are preserved. MR CERVICAL SPINE FINDINGS: Cervical spine straightening is unchanged. There is no significant listhesis. There is progressive osseous metastatic disease throughout the cervical spine since the 2015 MRI. There is now complete marrow replacement involving the C4 vertebral body with patchy enhancement including a 1 cm focus posteriorly. There is also a left-sided posterior element involvement at C4 which is new. No epidural tumor or spinal cord compression is identified. No cervical spinal cord signal abnormality is identified within limitations of motion artifact. Diffuse cervical disc degeneration is again seen. There is mild neural foraminal stenosis on the right at C3-4, on the left at C4-5, and likely bilaterally at C5-6 and C6-7. This is overall similar to the prior MRI. No significant spinal stenosis is seen. The paraspinal soft tissues are unremarkable. MR THORACIC SPINE FINDINGS: Diffuse osseous metastatic disease is again seen throughout the vertebral bodies and posterior elements of the thoracic spine. There has overall been mild progression from the 07/05/2015 thoracic spine MRI. There is now near complete involvement of the T10 vertebral body. There is mildly progressive marrow replacement involving multiple mid thoracic vertebral bodies  as well as T1 and T2. The T3 vertebral body remains completely involved. There is a mild T12 superior endplate compression fracture which demonstrates slightly progressive height loss compared to the prior study. There is slightly prominent ventral epidural enhancement in the mid thoracic spine from T5 -T8, most conspicuous and mildly nodular in a left paracentral location at the T8 superior endplate level (series 37, image 33). This may reflect very small volume epidural tumor, prominent epidural venous plexus, or a combination of both. This has likely not significantly changed from the prior thoracic spine MRI, although there is much more motion artifact on axial sequences on that study. The spinal cord is normal in caliber and signal. There is no spinal cord compression. Multilevel disc degeneration is again seen with disc bulging and small disc protrusions without significant spinal canal or neural foraminal  stenosis. Small left adrenal nodule and mild left upper pole renal caliectasis or cysts are partially visualized, grossly similar to the 09/07/2014 PET-CT. MR LUMBAR SPINE FINDINGS: Vertebral alignment is unchanged, with trace anterolisthesis again seen of L3 on L4. Osseous lesions throughout the lumbar spine do not appear significantly changed from the prior lumbar spine MRI, with the largest lesion in the L1 vertebral body. S3 level sacral osseous metastatic disease is partially visualized and grossly similar to the prior study. 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 Lumbar vertebral body heights are preserved. There is diffuse lumbar disc desiccation. Severe disc space height loss at L4-5 is unchanged. Multilevel disc and facet degeneration does not appear significantly changed, with mild right foraminal stenosis again seen at L3-4. There is no spinal stenosis. No epidural tumor is identified. Conus medullaris is normal in signal and terminates at L1. IMPRESSION: 1. No acute intracranial  abnormality. No evidence of brain metastases. 2. Numerous osseous skull metastases, progressed from 2010. 3. Widespread spinal osseous metastatic disease, with interval progression in the cervical and thoracic spine. No sizable epidural tumor identified. Slightly prominent ventral epidural enhancement in the mid thoracic spine is favored to mostly reflect epidural venous plexus, however trace epidural tumor is possible particularly at T8. 4. No spinal stenosis. 5. Mild T12 superior endplate compression fracture with slightly progressive height loss from 07/05/2015. Electronically Signed   By: Logan Bores M.D.   On: 08/25/2015 17:08   Nm Pet Image Restag (ps) Skull Base To Thigh  08/31/2015  CLINICAL DATA:  Subsequent treatment strategy for metastatic breast carcinoma to bone. EXAM: NUCLEAR MEDICINE PET SKULL BASE TO THIGH TECHNIQUE: 6.3 mCi F-18 FDG was injected intravenously. Full-ring PET imaging was performed from the skull base to thigh after the radiotracer. CT data was obtained and used for attenuation correction and anatomic localization. FASTING BLOOD GLUCOSE:  Value: 103 mg/dl COMPARISON:  09/07/2014 FINDINGS: NECK No hypermetabolic lymph nodes in the neck. CHEST No hypermetabolic mediastinal or hilar nodes. No suspicious pulmonary nodules on the CT scan. Biapical pleural- parenchymal scarring appears stable. Previously seen area of hypermetabolic airspace disease in the left upper lobe has nearly completely resolved, consistent with resolving infectious or inflammatory process. ABDOMEN/PELVIS No abnormal hypermetabolic activity within the liver, pancreas, adrenal glands, or spleen. No hypermetabolic lymph nodes in the abdomen or pelvis. Tiny left hepatic lobe cyst remains stable. Prior cholecystectomy noted. 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 11 mm left adrenal nodule remains stable and shows no metabolic activity, consistent with benign adrenal adenoma. Colonic diverticulosis noted, without  evidence of diverticulitis. SKELETON Mixed lytic and sclerotic bone metastases again seen within spine, pelvis, sternum, and proximal left femur. These appear stable and show no significant change in low-grade metabolic activity, consistent with largely treated osseous metastatic disease. IMPRESSION: Near complete resolution of hypermetabolic airspace disease in left upper lobe, consistent with resolving infectious or inflammatory process. No significant change in appearance of largely treated osseous metastatic disease. No new or progressive metastatic disease identified. Electronically Signed   By: Earle Gell M.D.   On: 08/31/2015 12:48    ASSESSMENT: 70 y.o. [place], [place] woman status post right lumpectomy and axillary lymph node dissection December 2006 for a [place] N2, stage IIIA invasive ductal carcinoma, estrogen and progesterone receptor positive, with subsequent stage IV disease  (1) received adjuvant doxorubicin, docetaxel and cyclophosphamide  (2) status post adjuvant radiation  (3) on tamoxifen to September 2011 when she was noted to have metastatic disease to bone  (4)  fulvestrant 02/06/2009 to 01/15/2011  (5) left axillary lymph node positive for an invasive ductal carcinoma, estrogen receptor and HER-2 negative November 2011, and a right breast recurrence for an estrogen receptor positive lesion at the same time.  (6) status post bilateral mastectomies 06/28/2010  (7) status post left chest and axillary radiation  (8) status post right chest wall recurrence October of 2012, treated with electron beam radiation completed December 2012  (9) progression in bone August 2013,  (10) status post radiation to the lumbosacral spine completed October 2013   (11) capecitabine November 2013 through January 2014  (12) tamoxifen/ everolimus February 2014, discontinued November 2014 with progression  (13) Megace 80 mg twice daily with progression January 2015  (14) Radiation to lumbar spine  February 2015  (15) letrozole plus Ibrance started 07/08/2013, discontinued May 2017 with progression  (16). Declines genetic testing (as of 08/17/2013)  (17) zolendronate 05/01/2009, discontinued because of pain; Xgeva started 10/08/2013, discontinued because of cost issues as of November 2015, switched to alendronate March 2016, switched back to [place] but stopped after the May 2016 dose because of side effects, on ibandronate [125 mg/ day 21 days on, 7 days off] temporarily, denosumab to be resumed 10/02/2015  (18) cyclophosphamide, methotrexate, and fluorouracil to be started 10/02/2015, repeated every 21 days.  OTHER ACTIVE PROBLEMS:  (1) chronic GI complaints, followed by Dr. Sydell Axon  (a) treated for c difficile colitis Sept 2016 (stool Ag positive, toxin negative 01/23/2015), received oral vancomycin started 01/24/2015, stopped 02/07/2015  (2) multiple orthopedic problems, including bilateral chronic foot pain, history of rotator cuff repair  (3) history of extensive superficial phlebitis, on chronic low dose Coumadin.  (4) LUL cavitary pneumonia diagnosed as MRSA while at [facility] April 2016, treated with Zosyn x 21 days  PLAN: We spent approximately 50 minutes going over [name]'s complex situation today. [name]'s cancer has finally learned to get around her current treatment and we are stopping the palbociclib and letrozole.  We went through the 3 questions we always goes through when dealing with stage IV breast cancer. The first question is whether to treat or go for comfort care. She very much wants to be treated and aggressively if necessary.  We then discussed the second question, whether to go for other anti-estrogens or chemotherapy. At this point I feel we have run through pretty much all the anti-estrogens, and while we can recycle them I think the chance of getting a significant response for them is low. Accordingly and suggesting we go with chemotherapy.  The  third question is, which specific agents to use. If she lived in [place] I would probably suggest [place], which is generally well-tolerated, but this is a day one and day 8 drug. I think is more sensible to try a 21 day treatment given the distance she has to, and so we're going to give CMF a try.  Today we discussed the possible toxicities, side effects and complications of this combination. She is going to receive the first treatment on June 5. We are going to also resume the denosumab, which will help her bones, and this will be given every 6 weeks, with every other CMF treatment.  She will need a port and this is being operational lysed, hopefully next week. I have asked her to stop her Coumadin for 3 days before the procedure.  I generally check lab work day 8 after a CMF treatment. This could be done through her local physician, and we will review that when  she returns to see me on the day of treatment  She is also scheduled to meet with Dr. Sondra Come next week to consider palliative radiation to some of the skull lesions, which are particular concern.  I don't think her tremor is disabling at this point but we are going to try low-dose Coreg to see if that helps. As far as her pain is concerned we are continuing the current combination of hydrocodone/Tylenol and MSIR, which is working well for her. She was again encouraged to make sure to have soft bowel movements at least every other day. Also she needs to taper off her Decadron. We will start that next week.  [name] was very agitated today. I was pleased that she brought a friend with her, but I'm concerned that she may not have the support she really needs locally. We have asked her to call us for any problems that may develop before her next visit here.   Chauncey Cruel, MD   09/06/2015 7:35 PM

## 2015-09-06 ENCOUNTER — Encounter: Payer: Self-pay | Admitting: *Deleted

## 2015-09-06 ENCOUNTER — Ambulatory Visit (HOSPITAL_BASED_OUTPATIENT_CLINIC_OR_DEPARTMENT_OTHER): Payer: Medicare PPO | Admitting: Oncology

## 2015-09-06 ENCOUNTER — Other Ambulatory Visit: Payer: Self-pay | Admitting: *Deleted

## 2015-09-06 ENCOUNTER — Other Ambulatory Visit (HOSPITAL_BASED_OUTPATIENT_CLINIC_OR_DEPARTMENT_OTHER): Payer: Medicare PPO

## 2015-09-06 ENCOUNTER — Other Ambulatory Visit (HOSPITAL_COMMUNITY)
Admission: RE | Admit: 2015-09-06 | Discharge: 2015-09-06 | Disposition: A | Discharge status: Home / Self Care | Payer: Medicare PPO | Source: Ambulatory Visit | Attending: Oncology | Admitting: Oncology

## 2015-09-06 ENCOUNTER — Telehealth: Payer: Self-pay | Admitting: Oncology

## 2015-09-06 VITALS — BP 134/79 | HR 92 | Temp 98.0°F | Resp 18 | Ht 62.5 in | Wt 131.8 lb

## 2015-09-06 DIAGNOSIS — Z8672 Personal history of thrombophlebitis: Secondary | ICD-10-CM | POA: Diagnosis present

## 2015-09-06 DIAGNOSIS — C44501 Unspecified malignant neoplasm of skin of breast: Secondary | ICD-10-CM

## 2015-09-06 DIAGNOSIS — C7951 Secondary malignant neoplasm of bone: Principal | ICD-10-CM

## 2015-09-06 DIAGNOSIS — C50911 Malignant neoplasm of unspecified site of right female breast: Secondary | ICD-10-CM

## 2015-09-06 DIAGNOSIS — C50919 Malignant neoplasm of unspecified site of unspecified female breast: Secondary | ICD-10-CM

## 2015-09-06 DIAGNOSIS — Z86711 Personal history of pulmonary embolism: Secondary | ICD-10-CM | POA: Diagnosis not present

## 2015-09-06 DIAGNOSIS — J984 Other disorders of lung: Secondary | ICD-10-CM

## 2015-09-06 DIAGNOSIS — C50811 Malignant neoplasm of overlapping sites of right female breast: Secondary | ICD-10-CM

## 2015-09-06 DIAGNOSIS — C50912 Malignant neoplasm of unspecified site of left female breast: Secondary | ICD-10-CM

## 2015-09-06 DIAGNOSIS — C773 Secondary and unspecified malignant neoplasm of axilla and upper limb lymph nodes: Secondary | ICD-10-CM

## 2015-09-06 LAB — CBC WITH DIFFERENTIAL/PLATELET
BASO%: 0.2 % (ref 0.0–2.0)
Basophils Absolute: 0 10*3/uL (ref 0.0–0.1)
EOS ABS: 0 10*3/uL (ref 0.0–0.5)
EOS%: 0 % (ref 0.0–7.0)
HCT: 32.2 % — ABNORMAL LOW (ref 34.8–46.6)
HEMOGLOBIN: 10.6 g/dL — AB (ref 11.6–15.9)
LYMPH%: 2 % — AB (ref 14.0–49.7)
MCH: 35.2 pg — ABNORMAL HIGH (ref 25.1–34.0)
MCHC: 33 g/dL (ref 31.5–36.0)
MCV: 106.8 fL — AB (ref 79.5–101.0)
MONO#: 0.7 10*3/uL (ref 0.1–0.9)
MONO%: 9.3 % (ref 0.0–14.0)
NEUT%: 88.5 % — ABNORMAL HIGH (ref 38.4–76.8)
NEUTROS ABS: 6.3 10*3/uL (ref 1.5–6.5)
Platelets: 245 10*3/uL (ref 145–400)
RBC: 3.02 10*6/uL — AB (ref 3.70–5.45)
RDW: 18.8 % — AB (ref 11.2–14.5)
WBC: 7.1 10*3/uL (ref 3.9–10.3)
lymph#: 0.1 10*3/uL — ABNORMAL LOW (ref 0.9–3.3)

## 2015-09-06 LAB — COMPREHENSIVE METABOLIC PANEL
ALBUMIN: 2.8 g/dL — AB (ref 3.5–5.0)
ALT: 23 U/L (ref 0–55)
AST: 16 U/L (ref 5–34)
Alkaline Phosphatase: 161 U/L — ABNORMAL HIGH (ref 40–150)
Anion Gap: 9 mEq/L (ref 3–11)
BILIRUBIN TOTAL: 0.54 mg/dL (ref 0.20–1.20)
BUN: 35 mg/dL — AB (ref 7.0–26.0)
CO2: 28 meq/L (ref 22–29)
Calcium: 9 mg/dL (ref 8.4–10.4)
Chloride: 104 mEq/L (ref 98–109)
Creatinine: 0.9 mg/dL (ref 0.6–1.1)
EGFR: 66 mL/min/{1.73_m2} — AB (ref >90)
GLUCOSE: 128 mg/dL (ref 70–140)
POTASSIUM: 4.3 meq/L (ref 3.5–5.1)
SODIUM: 142 meq/L (ref 136–145)
TOTAL PROTEIN: 6.4 g/dL (ref 6.4–8.3)

## 2015-09-06 LAB — PROTIME-INR
INR: 9.01 (ref 0.00–1.49)
PROTHROMBIN TIME: 70 s — AB (ref 11.6–15.2)

## 2015-09-06 MED ORDER — MORPHINE SULFATE 15 MG PO TABS: 15.0000 mg | ORAL_TABLET | Freq: Two times a day (BID) | ORAL | Status: DC | PRN

## 2015-09-06 MED ORDER — CARVEDILOL 3.125 MG PO TABS: 3.1250 mg | ORAL_TABLET | Freq: Two times a day (BID) | ORAL | Status: DC

## 2015-09-06 MED ORDER — HYDROCODONE-ACETAMINOPHEN 10-325 MG PO TABS: 1.0000 | ORAL_TABLET | ORAL | Status: DC | PRN

## 2015-09-06 NOTE — Progress Notes (Unsigned)
Critical PT/INR=70.0/9.01 given to Val (RN) 09/06/15 at 1155 am by MKerr ([facility])

## 2015-09-06 NOTE — Telephone Encounter (Signed)
appt made and avs printed °

## 2015-09-07 ENCOUNTER — Other Ambulatory Visit: Payer: Self-pay | Admitting: *Deleted

## 2015-09-07 DIAGNOSIS — C50919 Malignant neoplasm of unspecified site of unspecified female breast: Secondary | ICD-10-CM

## 2015-09-07 DIAGNOSIS — C7951 Secondary malignant neoplasm of bone: Principal | ICD-10-CM

## 2015-09-07 MED ORDER — DEXAMETHASONE 4 MG PO TABS: ORAL_TABLET | ORAL | Status: DC

## 2015-09-07 NOTE — Telephone Encounter (Signed)
This RN spoke with pt and verified current decadron dose is 4 mg bid.  Informed pt above will need to be tapered to stop- new prescription with tapering instructions sent to pharmacy.

## 2015-09-08 ENCOUNTER — Other Ambulatory Visit: Payer: Self-pay | Admitting: *Deleted

## 2015-09-08 ENCOUNTER — Telehealth: Payer: Self-pay | Admitting: *Deleted

## 2015-09-08 NOTE — Telephone Encounter (Signed)
INR 9  Pt to hold coumadin at this time until post port insertion on 5/18- communicated to pt per visit with MD.  This RN called and faxed order for INR to be drawn at [facility] for lab on 5/18.

## 2015-09-12 ENCOUNTER — Other Ambulatory Visit: Payer: Self-pay | Admitting: General Surgery

## 2015-09-12 ENCOUNTER — Other Ambulatory Visit: Payer: Self-pay | Admitting: Radiology

## 2015-09-13 ENCOUNTER — Other Ambulatory Visit: Payer: Self-pay | Admitting: Radiology

## 2015-09-13 ENCOUNTER — Other Ambulatory Visit: Payer: Self-pay | Admitting: General Surgery

## 2015-09-13 ENCOUNTER — Ambulatory Visit (HOSPITAL_COMMUNITY): Payer: Medicare PPO

## 2015-09-13 ENCOUNTER — Other Ambulatory Visit (HOSPITAL_COMMUNITY): Payer: Medicare PPO

## 2015-09-14 ENCOUNTER — Inpatient Hospital Stay (HOSPITAL_COMMUNITY)
Admission: AD | Admit: 2015-09-14 | Discharge: 2015-09-22 | DRG: 871 | Disposition: A | Discharge status: Skilled Nursing Facility | Payer: Medicare PPO | Source: Other Acute Inpatient Hospital | Attending: Internal Medicine | Admitting: Internal Medicine

## 2015-09-14 ENCOUNTER — Encounter (HOSPITAL_COMMUNITY): Payer: Self-pay | Admitting: *Deleted

## 2015-09-14 ENCOUNTER — Other Ambulatory Visit (HOSPITAL_COMMUNITY): Payer: Medicare PPO

## 2015-09-14 ENCOUNTER — Ambulatory Visit (HOSPITAL_COMMUNITY): Payer: Medicare PPO

## 2015-09-14 DIAGNOSIS — E43 Unspecified severe protein-calorie malnutrition: Secondary | ICD-10-CM | POA: Diagnosis not present

## 2015-09-14 DIAGNOSIS — Z87891 Personal history of nicotine dependence: Secondary | ICD-10-CM

## 2015-09-14 DIAGNOSIS — C773 Secondary and unspecified malignant neoplasm of axilla and upper limb lymph nodes: Secondary | ICD-10-CM | POA: Diagnosis not present

## 2015-09-14 DIAGNOSIS — Z8672 Personal history of thrombophlebitis: Secondary | ICD-10-CM

## 2015-09-14 DIAGNOSIS — R0781 Pleurodynia: Secondary | ICD-10-CM | POA: Diagnosis not present

## 2015-09-14 DIAGNOSIS — R0602 Shortness of breath: Secondary | ICD-10-CM

## 2015-09-14 DIAGNOSIS — C50011 Malignant neoplasm of nipple and areola, right female breast: Secondary | ICD-10-CM

## 2015-09-14 DIAGNOSIS — Z886 Allergy status to analgesic agent status: Secondary | ICD-10-CM | POA: Diagnosis not present

## 2015-09-14 DIAGNOSIS — Z923 Personal history of irradiation: Secondary | ICD-10-CM

## 2015-09-14 DIAGNOSIS — E860 Dehydration: Secondary | ICD-10-CM | POA: Diagnosis present

## 2015-09-14 DIAGNOSIS — R6510 Systemic inflammatory response syndrome (SIRS) of non-infectious origin without acute organ dysfunction: Secondary | ICD-10-CM | POA: Diagnosis present

## 2015-09-14 DIAGNOSIS — C50919 Malignant neoplasm of unspecified site of unspecified female breast: Secondary | ICD-10-CM | POA: Diagnosis present

## 2015-09-14 DIAGNOSIS — Z79899 Other long term (current) drug therapy: Secondary | ICD-10-CM | POA: Diagnosis not present

## 2015-09-14 DIAGNOSIS — Z8701 Personal history of pneumonia (recurrent): Secondary | ICD-10-CM | POA: Diagnosis not present

## 2015-09-14 DIAGNOSIS — Z9049 Acquired absence of other specified parts of digestive tract: Secondary | ICD-10-CM | POA: Diagnosis not present

## 2015-09-14 DIAGNOSIS — Z6824 Body mass index (BMI) 24.0-24.9, adult: Secondary | ICD-10-CM

## 2015-09-14 DIAGNOSIS — Y95 Nosocomial condition: Secondary | ICD-10-CM | POA: Diagnosis present

## 2015-09-14 DIAGNOSIS — I10 Essential (primary) hypertension: Secondary | ICD-10-CM | POA: Diagnosis present

## 2015-09-14 DIAGNOSIS — R945 Abnormal results of liver function studies: Secondary | ICD-10-CM

## 2015-09-14 DIAGNOSIS — Z515 Encounter for palliative care: Secondary | ICD-10-CM | POA: Insufficient documentation

## 2015-09-14 DIAGNOSIS — Z7901 Long term (current) use of anticoagulants: Secondary | ICD-10-CM

## 2015-09-14 DIAGNOSIS — Z7952 Long term (current) use of systemic steroids: Secondary | ICD-10-CM

## 2015-09-14 DIAGNOSIS — A4101 Sepsis due to Methicillin susceptible Staphylococcus aureus: Secondary | ICD-10-CM | POA: Insufficient documentation

## 2015-09-14 DIAGNOSIS — Z79810 Long term (current) use of selective estrogen receptor modulators (SERMs): Secondary | ICD-10-CM

## 2015-09-14 DIAGNOSIS — R630 Anorexia: Secondary | ICD-10-CM | POA: Diagnosis not present

## 2015-09-14 DIAGNOSIS — J189 Pneumonia, unspecified organism: Secondary | ICD-10-CM | POA: Diagnosis present

## 2015-09-14 DIAGNOSIS — K30 Functional dyspepsia: Secondary | ICD-10-CM | POA: Diagnosis present

## 2015-09-14 DIAGNOSIS — Z170 Estrogen receptor positive status [ER+]: Secondary | ICD-10-CM | POA: Diagnosis not present

## 2015-09-14 DIAGNOSIS — R627 Adult failure to thrive: Secondary | ICD-10-CM | POA: Diagnosis present

## 2015-09-14 DIAGNOSIS — Z885 Allergy status to narcotic agent status: Secondary | ICD-10-CM | POA: Diagnosis not present

## 2015-09-14 DIAGNOSIS — I76 Septic arterial embolism: Secondary | ICD-10-CM | POA: Diagnosis present

## 2015-09-14 DIAGNOSIS — G893 Neoplasm related pain (acute) (chronic): Secondary | ICD-10-CM | POA: Diagnosis not present

## 2015-09-14 DIAGNOSIS — K219 Gastro-esophageal reflux disease without esophagitis: Secondary | ICD-10-CM | POA: Diagnosis present

## 2015-09-14 DIAGNOSIS — Z801 Family history of malignant neoplasm of trachea, bronchus and lung: Secondary | ICD-10-CM

## 2015-09-14 DIAGNOSIS — Z9013 Acquired absence of bilateral breasts and nipples: Secondary | ICD-10-CM | POA: Diagnosis not present

## 2015-09-14 DIAGNOSIS — R1084 Generalized abdominal pain: Secondary | ICD-10-CM | POA: Insufficient documentation

## 2015-09-14 DIAGNOSIS — B9562 Methicillin resistant Staphylococcus aureus infection as the cause of diseases classified elsewhere: Secondary | ICD-10-CM | POA: Diagnosis not present

## 2015-09-14 DIAGNOSIS — C44501 Unspecified malignant neoplasm of skin of breast: Secondary | ICD-10-CM | POA: Diagnosis not present

## 2015-09-14 DIAGNOSIS — D638 Anemia in other chronic diseases classified elsewhere: Secondary | ICD-10-CM | POA: Diagnosis present

## 2015-09-14 DIAGNOSIS — R7881 Bacteremia: Secondary | ICD-10-CM | POA: Diagnosis not present

## 2015-09-14 DIAGNOSIS — M79673 Pain in unspecified foot: Secondary | ICD-10-CM | POA: Diagnosis present

## 2015-09-14 DIAGNOSIS — Z803 Family history of malignant neoplasm of breast: Secondary | ICD-10-CM

## 2015-09-14 DIAGNOSIS — R7989 Other specified abnormal findings of blood chemistry: Secondary | ICD-10-CM | POA: Diagnosis present

## 2015-09-14 DIAGNOSIS — C7951 Secondary malignant neoplasm of bone: Secondary | ICD-10-CM | POA: Diagnosis present

## 2015-09-14 DIAGNOSIS — Z800 Family history of malignant neoplasm of digestive organs: Secondary | ICD-10-CM

## 2015-09-14 DIAGNOSIS — E876 Hypokalemia: Secondary | ICD-10-CM | POA: Diagnosis present

## 2015-09-14 DIAGNOSIS — R509 Fever, unspecified: Secondary | ICD-10-CM | POA: Diagnosis present

## 2015-09-14 DIAGNOSIS — A4102 Sepsis due to Methicillin resistant Staphylococcus aureus: Secondary | ICD-10-CM | POA: Diagnosis not present

## 2015-09-14 LAB — URINALYSIS, ROUTINE W REFLEX MICROSCOPIC
Bilirubin Urine: NEGATIVE
GLUCOSE, UA: NEGATIVE mg/dL
KETONES UR: NEGATIVE mg/dL
LEUKOCYTES UA: NEGATIVE
Nitrite: POSITIVE — AB
PH: 5.5 (ref 5.0–8.0)
Protein, ur: 30 mg/dL — AB
SPECIFIC GRAVITY, URINE: 1.013 (ref 1.005–1.030)

## 2015-09-14 LAB — URINE MICROSCOPIC-ADD ON

## 2015-09-14 LAB — STREP PNEUMONIAE URINARY ANTIGEN: STREP PNEUMO URINARY ANTIGEN: NEGATIVE

## 2015-09-14 LAB — PROTIME-INR
INR: 1.9 — AB (ref 0.00–1.49)
Prothrombin Time: 21.1 seconds — ABNORMAL HIGH (ref 11.6–15.2)

## 2015-09-14 MED ORDER — DEXTROSE 5 % IV SOLN
2.0000 g | Freq: Once | INTRAVENOUS | Status: AC
  Administered 2015-09-14: 2 g via INTRAVENOUS
  Filled 2015-09-14: qty 2

## 2015-09-14 MED ORDER — MORPHINE SULFATE (PF) 2 MG/ML IV SOLN
1.0000 mg | INTRAVENOUS | Status: DC | PRN
  Administered 2015-09-14 – 2015-09-15 (×2): 1 mg via INTRAVENOUS
  Filled 2015-09-14 (×5): qty 1

## 2015-09-14 MED ORDER — WARFARIN SODIUM 2 MG PO TABS
2.0000 mg | ORAL_TABLET | Freq: Once | ORAL | Status: AC
  Administered 2015-09-14: 2 mg via ORAL
  Filled 2015-09-14: qty 1

## 2015-09-14 MED ORDER — VANCOMYCIN HCL 500 MG IV SOLR
500.0000 mg | Freq: Two times a day (BID) | INTRAVENOUS | Status: DC
  Administered 2015-09-15 – 2015-09-17 (×5): 500 mg via INTRAVENOUS
  Filled 2015-09-14 (×5): qty 500

## 2015-09-14 MED ORDER — HYDROCODONE-ACETAMINOPHEN 10-325 MG PO TABS
1.0000 | ORAL_TABLET | Freq: Four times a day (QID) | ORAL | Status: DC | PRN
  Administered 2015-09-14 – 2015-09-15 (×3): 2 via ORAL
  Filled 2015-09-14 (×3): qty 2

## 2015-09-14 MED ORDER — MORPHINE SULFATE 15 MG PO TABS
15.0000 mg | ORAL_TABLET | Freq: Two times a day (BID) | ORAL | Status: DC | PRN
  Administered 2015-09-15: 15 mg via ORAL
  Filled 2015-09-14: qty 1

## 2015-09-14 MED ORDER — WARFARIN - PHARMACIST DOSING INPATIENT: Freq: Every day | Status: DC

## 2015-09-14 MED ORDER — POLYVINYL ALCOHOL 1.4 % OP SOLN
1.0000 [drp] | Freq: Every day | OPHTHALMIC | Status: DC | PRN
  Filled 2015-09-14: qty 15

## 2015-09-14 MED ORDER — VANCOMYCIN HCL 10 G IV SOLR
1250.0000 mg | Freq: Once | INTRAVENOUS | Status: AC
  Administered 2015-09-14: 1250 mg via INTRAVENOUS
  Filled 2015-09-14: qty 1250

## 2015-09-14 MED ORDER — BOOST / RESOURCE BREEZE PO LIQD
1.0000 | Freq: Two times a day (BID) | ORAL | Status: DC
  Administered 2015-09-18 (×2): 1 via ORAL

## 2015-09-14 MED ORDER — DEXTROSE 5 % IV SOLN
1.0000 g | Freq: Two times a day (BID) | INTRAVENOUS | Status: DC
  Administered 2015-09-15 – 2015-09-19 (×9): 1 g via INTRAVENOUS
  Filled 2015-09-14 (×10): qty 1

## 2015-09-14 MED ORDER — SODIUM CHLORIDE 0.9 % IV SOLN
INTRAVENOUS | Status: DC
  Administered 2015-09-14: 19:00:00 via INTRAVENOUS

## 2015-09-14 NOTE — H&P (Signed)
HISTORY AND PHYSICAL       PATIENT DETAILS Name: [name] Age: 70 y.o. Sex: female Date of Birth: [birth year] Admit Date: 09/14/2015 [id number] E, MD  Patient coming from: [place]:  Fever with chills, cough, worsening weakness since this morning  HPI: [name] is a 70 y.o. 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 She would not ablate on own. She was also slightly confused. She was subsequently taken by friends to [facility] emergency room, where further evaluation revealed pneumonia. She was also found to have fever up 100.7 and initial systolic blood pressure in the 80s, she was given IV fluids with subsequent improvement in her blood pressure. She was subsequently transferred to [facility] for further evaluation and treatment.  Patient has had metastatic breast cancer and is followed closely by Dr. [name] at the cancer center, recently she was on palbociclib and letrozole which has now been stopped, with plans to start cyclophosphamide, methotrexate, and fluorouracil on 10/02/2015. She was scheduled to get a Port-A-Cath earlier this morning, and also scheduled to see radiation oncology doctor,  for palliative radiation.  Patient denies any headache, chest pain, shortness of breath, vomiting or abdominal pain. She has chronic intermittent diarrhea. She also has some ongoing nausea.  She's had significant loss of appetite over the past few weeks.   Lives at: Home/ Mobility: Independent Chronic Indwelling Foley:no   REVIEW OF SYSTEMS:  Constitutional:   No  night sweats  HEENT:    No headaches,  Dysphagia,Tooth/dental problems,Sore throat,   Cardio-vascular: No chest pain,Orthopnea, PND,lower extremity edema, anasarca, palpitations  GI:  No heartburn, indigestion, abdominal pain,  vomiting,  melena or hematochezia  Resp: No shortness of breath,hemoptysis,plueritic chest pain.   Skin:  No rash or lesions.  GU:  No dysuria, change in color of urine, no urgency or frequency.  No flank pain.  Musculoskeletal: No joint pain or swelling.  No decreased range of motion.  No back pain.  Endocrine: No heat intolerance, no cold intolerance, no polyuria, no polydipsia  Psych: No change in mood or affect. No depression or anxiety.  No memory loss.   ALLERGIES:   Allergies  Allergen Reactions  . Aspirin   . Percodan [Oxycodone-Aspirin] Nausea Only  . Codeine Nausea Only    PAST MEDICAL HISTORY: Past Medical History  Diagnosis Date  . Cancer (HCC)     RBreast, Lower back , Under l Arm  . Hypertension   . GERD (gastroesophageal reflux disease)   . Breast cancer metastasized to bone ([place]) 04/30/2011  . Radiation 01/15/2012    Lumbosacral spine 3500 cGy 14  fx  . Swollen R ankle 08/10/2012  . Fracture of ankle, medial malleolus, right, closed 08/10/2012  . Tubular adenoma 2011  . Hiatal hernia 2011  . Schatzki's ring   . Anorexia 03/02/2013  . Weight loss due to medication 03/02/2013  . C. difficile colitis   . Pneumonia   . 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 Gallbladder surgery  1991  . Mastectomy  06/28/10    bilateral- Dr. Rosebud Poles,  [place]  . Inguinal hernia repair  90's    right  . Parathyroidectomy  90's  . Carpal tunnel release  90's  . Oophorectomy  90's    right  . Lymph node dissection    . Breast surgery    . Cholecystectomy    . Colonoscopy  01/08/10    Dr. [name]- anal tag,hemorrhoid o/w normal rectum, pancolonic diverticula, diminutive polyp in the base of the cecum=  tubular adenoma on bx. poor prep  . Esophagogastroduodenoscopy  01/08/10    Dr. [name]- normal esophagus, small hiatal hernia, antum and body erosions, pedunculated polyp between D1 and D2, duodenal diverticulum, lymphangiectasia, second portion of the duodenum.stomach bx= inflammation, duodenum bx= adenoma  . Rotator cuff repair  08/27/11    Dr. Alphonzo Cruise- [facility]- [place] [facility]  . Esophagogastroduodenoscopy (egd) with propofol N/A 11/19/2012    RMR: non-critical Schatzki's ring s/p dilation with 47 F, multiple gastric polyps, duodenal polyp s/p piecemeal snare polypectomy, gastric polypectomy, path benign  . Maloney dilation N/A 11/19/2012    Procedure: Venia Minks DILATION;  Surgeon: Daneil Dolin, MD;  Location: [facility];  Service: Endoscopy;  Laterality: N/A;  #[id number]  . Polypectomy N/A 11/19/2012    Procedure: POLYPECTOMY;  Surgeon: Daneil Dolin, MD;  Location: [facility];  Service: Endoscopy;  Laterality: N/A;  duodenal  . Biopsy N/A 11/19/2012    Procedure: BIOPSY;  Surgeon: Daneil Dolin, MD;  Location: [facility];  Service: Endoscopy;  Laterality: N/A;  esophageal    MEDICATIONS AT HOME: Prior to Admission medications   Medication Sig Start Date End Date Taking? Authorizing Provider  carvedilol (COREG) 3.125 MG tablet Take 1 tablet (3.125 mg total) by mouth 2 (two) times daily with a meal. 09/06/15   Chauncey Cruel, MD  dexamethasone (DECADRON) 4 MG tablet Taper dose of 4 mg daily until 5/18 then decrease to 2 mg daily until 5/25 then decrease to 2 mg every other day until 6/1 and then may stop 09/07/15   Chauncey Cruel, MD  feeding supplement (BOOST / RESOURCE BREEZE) LIQD Take 1 Container by mouth 2 (two) times daily between meals. 01/28/15   Samuella Cota, MD  HYDROcodone-acetaminophen [facility]) 10-325 MG tablet Take 1-2 tablets by mouth every 4 (four) hours as needed for moderate pain or severe pain. 09/06/15   Chauncey Cruel, MD  hyoscyamine (ANASPAZ) 0.125 MG TBDP disintergrating tablet DISSOLVE 1  TABLET(0.125 MG) UNDER THE TONGUE FOUR TIMES DAILY BEFORE MEALS AND AT BEDTIME AS NEEDED FOR DIARRHEA OR ABDOMINAL CRAMPS 06/06/15   Orvil Feil, NP  lisinopril (PRINIVIL,ZESTRIL) 20 MG tablet Take 10 mg by mouth every evening.  06/20/14   Historical Provider, MD  loperamide (IMODIUM A-D) 2 MG tablet Take 2 mg by mouth 4 (four) times daily as needed for diarrhea or loose stools.    Historical Provider, MD  mometasone (ELOCON) 0.1 % lotion apply topically daily as needed for pain 10/17/14   Historical Provider, MD  morphine (MSIR) 15 MG tablet Take 1 tablet (15 mg total) by mouth every 12 (twelve) hours as needed for severe pain. Once every 12 hours as needed 09/06/15   Chauncey Cruel, MD  ondansetron (ZOFRAN-ODT) 8 MG disintegrating tablet DISSOLVE 1 TABLET ON THE TONGUE EVERY 8 HOURS AS NEEDED FOR NAUSEA 02/09/15   Chauncey Cruel, MD  Polyethyl Glycol-Propyl Glycol (SYSTANE OP) Apply 1 drop to eye daily as needed (dry eyes).    Historical Provider, MD  warfarin (COUMADIN) 4 MG tablet Take  1 tablet (4 mg total) by mouth daily. Patient taking differently: Take 2-4 mg by mouth daily. Take 2 mg on Mon thru Thurs & Take 4 mg on Fri, Sat, Sun 02/28/15   Chauncey Cruel, MD    FAMILY HISTORY: Family History  Problem Relation Age of Onset  . Cancer Mother     Colon  . Cancer Father     Lung, Mouth,leg    SOCIAL HISTORY:  reports that she quit smoking about 32 years ago. She has never used smokeless tobacco. She reports that she does not drink alcohol or use illicit drugs.  PHYSICAL EXAM: Blood pressure 133/78, pulse 97, temperature 99.8 F (37.7 C), temperature source Oral, resp. rate 22, height 5\' 2"  (1.575 m), weight 59.648 kg (131 lb 8 oz), SpO2 98 %.  General appearance :Awake, alert, not in any distress. Speech Clear. Looks chronically sick. HEENT: Atraumatic and Normocephalic, pupils equally reactive to light and accomodation Neck: supple, no JVD. No cervical lymphadenopathy.    Chest:Good air entry bilaterally, no added sounds  CVS: S1 S2 regular, no murmurs.  Abdomen: Bowel sounds present, Non tender and not distended with no gaurding, rigidity or rebound. Extremities: B/L Lower Ext shows no edema, both legs are warm to touch.+ Varicose veins Neurology:  Non focal Skin:No Rash Wounds:N/A  LABS ON ADMISSION:  I have personally reviewed following labs and imaging studies  CBC: No results for input(s): WBC, NEUTROABS, HGB, HCT, MCV, PLT in the last 168 hours.  Basic Metabolic Panel: No results for input(s): NA, K, CL, CO2, GLUCOSE, BUN, CREATININE, CALCIUM, MG, PHOS in the last 168 hours.  GFR: Estimated Creatinine Clearance: 45.3 mL/min (by C-G formula based on Cr of 0.9).  Liver Function Tests: No results for input(s): AST, ALT, ALKPHOS, BILITOT, PROT, ALBUMIN in the last 168 hours. No results for input(s): LIPASE, AMYLASE in the last 168 hours. No results for input(s): AMMONIA in the last 168 hours.  Coagulation Profile: No results for input(s): INR, PROTIME in the last 168 hours.  Cardiac Enzymes: No results for input(s): CKTOTAL, CKMB, CKMBINDEX, TROPONINI in the last 168 hours.  BNP (last 3 results) No results for input(s): PROBNP in the last 8760 hours.  HbA1C: No results for input(s): HGBA1C in the last 72 hours.  CBG: No results for input(s): GLUCAP in the last 168 hours.  Lipid Profile: No results for input(s): CHOL, HDL, LDLCALC, TRIG, CHOLHDL, LDLDIRECT in the last 72 hours.  Thyroid Function Tests: No results for input(s): TSH, T4TOTAL, FREET4, T3FREE, THYROIDAB in the last 72 hours.  Anemia Panel: No results for input(s): VITAMINB12, FOLATE, FERRITIN, TIBC, IRON, RETICCTPCT in the last 72 hours.  Urine analysis:    Component Value Date/Time   COLORURINE YELLOW 01/23/2015 1830   APPEARANCEUR CLEAR 01/23/2015 1830   LABSPEC 1.010 01/23/2015 1830   PHURINE 6.0 01/23/2015 1830   GLUCOSEU NEGATIVE 01/23/2015 1830   HGBUR  LARGE* 01/23/2015 1830   BILIRUBINUR NEGATIVE 01/23/2015 [place] 01/23/2015 1830   PROTEINUR TRACE* 01/23/2015 1830   UROBILINOGEN 0.2 01/23/2015 1830   NITRITE NEGATIVE 01/23/2015 1830   LEUKOCYTESUR NEGATIVE 01/23/2015 1830    Sepsis Labs: Lactic Acid, Venous No results found for: [place]   Microbiology: No results found for this or any previous visit (from the past 240 hour(s)).    RADIOLOGIC STUDIES ON ADMISSION: No results found.   ASSESSMENT AND PLAN: Present on Admission:  . SIR's due HCAP (healthcare-associated pneumonia): Admit to telemetry, given her immunocompromised  state and the fact that she was very recently on chemotherapy-will start intravenous vancomycin and cefepime. Blood cultures will be obtained, clinical course will be followed. She also had blood cultures drawn at [facility] which may need to be followed as well. She has been on a tapering dose of Decadron, we will place on stress dosing for a few days before resuming her usual taper.  . Anemia: Likely secondary to chronic disease, no evidence of blood loss. Repeat CBC in a.m.   . Breast cancer metastasized to bone: Patient has metastatic breast cancer, which has metastasized to the bone-she complains of a lot of "bone pain"-she unfortunately refused bisphosphonates in the past. For now we will manage with steroids, as needed narcotics. We will notify oncology of patient's admission.   . Anorexia: Likely related to underlying malignancy, supportive care. Nutrition eval.   . Essential hypertension: Hold both Coreg and lisinopril-patient was initially hypotensive on presentation-currently her blood pressure appears stable. Will resume when able.   Marland Kitchen History of extensive superficial venous thrombosis: Maintained on home within-her most recent INR was supratherapeutic-Coumadin has been held for the past 2 days-resume Coumadin per pharmacy.  . Protein-calorie malnutrition, severe:  Continue supplements, obtain nutrition evaluation.  . Failure to thrive syndrome: Likely secondary to underlying malignancy, obtain nutrition eval and PT evaluation. Currently she remains a full code, depending on her clinical course-may need to consult palliative care.  Further plan will depend as patient's clinical course evolves and further radiologic and laboratory data become available. Patient will be monitored closely.  Above noted plan was discussed with patient/family friend face to face at bedside, they were in agreement.   CONSULTS: None  DVT Prophylaxis: Warfarin  Code Status: Full Code  Disposition Plan:  Discharge back home vs SNF possibly in 3-4 days, depending on clinical course  Admission status: Inpatient going to tele  Total time spent  55 minutes.Greater than 50% of this time was spent in counseling, explanation of diagnosis, planning of further management, and coordination of care.  [facility] Pager [phone]  If 7PM-7AM, please contact night-coverage [email or web address] Password [facility] 09/14/2015, 6:13 PM

## 2015-09-14 NOTE — Progress Notes (Signed)
Pharmacy Antibiotic Follow-up Note  [name] is a 70 y.o. year-old female admitted on 09/14/2015.  The patient is currently on day 1/8 of Vancomycin & Cefepime for HCAP.  Assessment/Plan: Vancomycin 1250mg  x1, then 500mg  IV every 12 hours.  Goal trough 15-20 mcg/mL.  Cefepime 2gm x1, then 1gm q12  Temp (24hrs), Avg:99.8 F (37.7 C), Min:99.8 F (37.7 C), Max:99.8 F (37.7 C)  No results for input(s): WBC in the last 168 hours.  Invalid input(s):  CREATININE No results for input(s): CREATININE in the last 168 hours. Estimated Creatinine Clearance: 45.3 mL/min (by C-G formula based on Cr of 0.9).    Allergies  Allergen Reactions  . Aspirin   . Percodan [Oxycodone-Aspirin] Nausea Only  . Codeine Nausea Only   Antimicrobials this admission: 5/18 Vancomycin >>  5/18 Cefepime >>   Levels/dose changes this admission:  Microbiology results: Sputum, blood cultures planned Legionella, Strep to be ordered  Thank you for allowing pharmacy to be a part of this patient's care.  Minda Ditto PharmD 09/14/2015 6:25 PM

## 2015-09-14 NOTE — Progress Notes (Signed)
ANTICOAGULATION CONSULT NOTE - Initial Consult  Pharmacy Consult for Warfarin Indication: thrombophlebitis  Allergies  Allergen Reactions  . Aspirin   . Percodan [Oxycodone-Aspirin] Nausea Only  . Codeine Nausea Only   Patient Measurements: Height: 5\' 2"  (157.5 cm) Weight: 131 lb 8 oz (59.648 kg) IBW/kg (Calculated) : 50.1  Vital Signs: Temp: 99.8 F (37.7 C) (05/18 1730) Temp Source: Oral (05/18 1730) BP: 133/78 mmHg (05/18 1730) Pulse Rate: 97 (05/18 1730)  Labs: No results for input(s): HGB, HCT, PLT, APTT, LABPROT, INR, HEPARINUNFRC, HEPRLOWMOCWT, CREATININE, CKTOTAL, CKMB, TROPONINI in the last 72 hours.  Estimated Creatinine Clearance: 45.3 mL/min (by C-G formula based on Cr of 0.9).  Medical History: Past Medical History  Diagnosis Date  . Cancer (HCC)     RBreast, Lower back , Under l Arm  . Hypertension   . GERD (gastroesophageal reflux disease)   . Breast cancer metastasized to bone ([place]) 04/30/2011  . Radiation 01/15/2012    Lumbosacral spine 3500 cGy 14  fx  . Swollen R ankle 08/10/2012  . Fracture of ankle, medial malleolus, right, closed 08/10/2012  . Tubular adenoma 2011  . Hiatal hernia 2011  . Schatzki's ring   . Anorexia 03/02/2013  . Weight loss due to medication 03/02/2013  . C. difficile colitis   . Pneumonia   . 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 [START ON 09/15/2015] ceFEPime (MAXIPIME) IV  1 g Intravenous Q12H  . [START ON 09/15/2015] feeding supplement  1 Container Oral BID BM  . vancomycin  1,250 mg Intravenous Once  . [START ON 09/15/2015] vancomycin  500 mg Intravenous Q12H   Assessment: 14 yoF with onset of fever, chills, cough and worsening weakness.  Hx of Breast Ca, recent Palbociclib, Letrozole, Prolia; planning chemo in June, was scheduled for [facility] placement today 5/18. Patient taken originally to [facility], possible blood cx obtained there. PTA Warfarin for massive superficial thrombophlebitis, home  dose 2mg  M,Tu,W,Th, 4mg  F,SA,Su; last dose 5/10 - was held for planned [facility] placement 5/18. Last INR 5/10 was greatly elevated at 9.01.  Admit INR 1.90  Goal of Therapy:  INR 2-3 Monitor platelets by anticoagulation protocol: Yes   Plan:   Warfarin 2mg  tonight  Daily Protime/INR  Monitor CBC, sign/sx of bleed  Minda Ditto PharmD Pager [phone] 09/14/2015, 8:16 PM

## 2015-09-15 ENCOUNTER — Inpatient Hospital Stay (HOSPITAL_COMMUNITY): Payer: Medicare PPO

## 2015-09-15 DIAGNOSIS — R630 Anorexia: Secondary | ICD-10-CM

## 2015-09-15 DIAGNOSIS — R509 Fever, unspecified: Secondary | ICD-10-CM

## 2015-09-15 LAB — COMPREHENSIVE METABOLIC PANEL
ALT: 69 U/L — ABNORMAL HIGH (ref 14–54)
AST: 70 U/L — AB (ref 15–41)
Albumin: 2.1 g/dL — ABNORMAL LOW (ref 3.5–5.0)
Alkaline Phosphatase: 153 U/L — ABNORMAL HIGH (ref 38–126)
Anion gap: 8 (ref 5–15)
BILIRUBIN TOTAL: 1.4 mg/dL — AB (ref 0.3–1.2)
BUN: 25 mg/dL — AB (ref 6–20)
CO2: 24 mmol/L (ref 22–32)
Calcium: 7.8 mg/dL — ABNORMAL LOW (ref 8.9–10.3)
Chloride: 109 mmol/L (ref 101–111)
Creatinine, Ser: 0.76 mg/dL (ref 0.44–1.00)
Glucose, Bld: 75 mg/dL (ref 65–99)
POTASSIUM: 3.3 mmol/L — AB (ref 3.5–5.1)
Sodium: 141 mmol/L (ref 135–145)
TOTAL PROTEIN: 5.3 g/dL — AB (ref 6.5–8.1)

## 2015-09-15 LAB — ECHOCARDIOGRAM COMPLETE
HEIGHTINCHES: 62 in
Weight: 2104 oz

## 2015-09-15 LAB — CBC
HEMATOCRIT: 26.7 % — AB (ref 36.0–46.0)
Hemoglobin: 8.7 g/dL — ABNORMAL LOW (ref 12.0–15.0)
MCH: 33.5 pg (ref 26.0–34.0)
MCHC: 32.6 g/dL (ref 30.0–36.0)
MCV: 102.7 fL — AB (ref 78.0–100.0)
Platelets: 112 10*3/uL — ABNORMAL LOW (ref 150–400)
RBC: 2.6 MIL/uL — ABNORMAL LOW (ref 3.87–5.11)
RDW: 17 % — AB (ref 11.5–15.5)
WBC: 5.9 10*3/uL (ref 4.0–10.5)

## 2015-09-15 LAB — PROTIME-INR
INR: 2.54 — ABNORMAL HIGH (ref 0.00–1.49)
PROTHROMBIN TIME: 26.2 s — AB (ref 11.6–15.2)

## 2015-09-15 LAB — HIV ANTIBODY (ROUTINE TESTING W REFLEX): HIV SCREEN 4TH GENERATION: NONREACTIVE

## 2015-09-15 MED ORDER — MAGIC MOUTHWASH W/LIDOCAINE
10.0000 mL | Freq: Three times a day (TID) | ORAL | Status: DC | PRN
  Administered 2015-09-15: 10 mL via ORAL
  Filled 2015-09-15 (×2): qty 10

## 2015-09-15 MED ORDER — WARFARIN SODIUM 1 MG PO TABS
1.0000 mg | ORAL_TABLET | Freq: Once | ORAL | Status: AC
Start: 2015-09-15 — End: 2015-09-15
  Administered 2015-09-15: 1 mg via ORAL
  Filled 2015-09-15: qty 1

## 2015-09-15 MED ORDER — POTASSIUM CHLORIDE CRYS ER 20 MEQ PO TBCR
40.0000 meq | EXTENDED_RELEASE_TABLET | Freq: Once | ORAL | Status: AC
  Administered 2015-09-15: 40 meq via ORAL
  Filled 2015-09-15: qty 2

## 2015-09-15 NOTE — Care Management Note (Signed)
Case Management Note  Patient Details  Name: [name] MRN: [id number] Date of Birth: [birth year]  Subjective/Objective:  70 y/o f admitted w/HCAP. Hx: Breast Ca. From home alone. PT cons-await recc.                  Action/Plan:d/c plan home.   Expected Discharge Date:   (unknown)               Expected Discharge Plan:  [place]  In-House Referral:     Discharge planning Services  CM Consult  Post Acute Care Choice:    Choice offered to:     DME Arranged:    DME Agency:     HH Arranged:    HH Agency:     Status of Service:  In process, will continue to follow  Medicare Important Message Given:    Date Medicare IM Given:    Medicare IM give by:    Date Additional Medicare IM Given:    Additional Medicare Important Message give by:     If discussed at [place] of Stay Meetings, dates discussed:    Additional Comments:  Dessa Phi, RN 09/15/2015, 2:11 PM

## 2015-09-15 NOTE — Progress Notes (Signed)
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  GOAL:   Patient will meet greater than or equal to 90% of their needs  MONITOR:   PO intake, I & O's, Labs, Weight trends  REASON FOR ASSESSMENT:   Malnutrition Screening Tool, Consult Assessment of nutrition requirement/status  ASSESSMENT:   [name] is a 70 y.o. female with medical history significant of metastatic breast cancer, ongoing failure to thrive syndrome-history of chronic massive superficial thrombophlebitis on chronic low-dose Coumadin was in her usual state of health until early this morning, when she woke up, she was found to have fever with chills,   Spoke with [name] at bedside. She endorses poor appetite since onset of cancer, treatment. 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 She states she will eat something like a grilled cheese for dinner and a jimmy deans biscuit for breakfast. PO intake is very likely not meeting increased needs.  Within the past week, she states that she has eaten only a small amount of oatmeal.  Still experiencing some nausea. Denies chewing/swallowing problems -> was seen by SLP this morning and cleared for regular diet.  She endorses a normal wt of 192#  A few months ago. A thorough chart review has found she was 179# as of 11/2009. She was on megace and saw a solid weight gain of 10# as recent as 05/2015. May benefit her to receive megace again.  Her current weight has fluctuated, but  has been relatively stable for approximately 6 months.  Nutrition-Focused physical exam completed. Findings are severe fat depletion, moderate-severe muscle depletion, and no edema.   Labs and medications reviewed: Ca 7.8; Mg 1.4; K 3.3;  Diet Order:  Diet Heart Room service appropriate?: Yes; Fluid consistency:: Thin  Skin:  Reviewed, no issues (MSAD -> Buttocks)  Last BM:  5/19  Height:   Ht Readings from Last 1 Encounters:  09/14/15 5\' 2"  (1.575 m)    Weight:   Wt Readings from Last 1 Encounters:  09/14/15 131 lb 8 oz (59.648 kg)    Ideal Body Weight:  50 kg  BMI:  Body mass index is 24.05 kg/(m^2).  Estimated Nutritional Needs:   Kcal:  1800-2100 calories  Protein:  70-90 grams  Fluid:  >/= 1.8L  EDUCATION NEEDS:   No education needs identified at this time  Carolyn Anis. Angy Swearengin, MS, RD LDN Inpatient Clinical Dietitian Pager [phone]

## 2015-09-15 NOTE — Progress Notes (Addendum)
PROGRESS NOTE        PATIENT DETAILS Name: [name] Age: 70 y.o. Sex: female Date of Birth: 08-03-1945 Admit Date: 09/14/2015 Admitting Physician Evalee Mutton Kristeen Mans, MD CG:[id number] E, MD  Brief Narrative: Patient is a 70 y.o. female with history of metastatic breast cancer, admitted with healthcare associated pneumonia.  Subjective: Fever curve Better-she is not having chills like yesterday. Her main complaint today is generalized bone pain and weakness.  Assessment/Plan: Principal Problem: SIR's secondary to HCAP (healthcare-associated pneumonia): Clinically somewhat improved-fever curve better, main issues continue to be deconditioning and generalized weakness. Continue empiric vancomycin and cefepime. Urine streptococcal antigen negative, Legionella antigen pending. Blood cultures pending, will need to follow blood cultures done at [facility] as well. Continue to follow clinical course, will need to be mobilized by physical therapy.  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 Unfortunately, her deconditioning has only worsened due to acute illness and pneumonia. PT evaluation completed, recommendations are for SNF.  Mildly elevated LFTs: Could be from acute infection, but given history of malignancy-liver metastases is a cause of concern. RUQ ultrasound no obvious metastatic lesions. Recent PET scan on 08/31/15 did not show any major hepatic lesions as well. For now continue to follow.  Hypokalemia:replete and recheck  Anemia: Likely secondary to chronic disease-worsened by acute illness, no evidence of blood loss. Follow CBC  HTN: Continue to hold both Coreg and lisinopril-but pressure currently controlled without antihypertensives.  History of  extensive superficial venous thrombosis: Maintained on coumadin at home-continue per pharmacy  History of metastatic breast cancer: Recent PET scan on 08/31/15 showed numerous osseous mets-followed by Dr [name] (on vacation currently). Plans were to get a Porta cath on 5/18 and to start cyclophosphamide, methotrexate, and fluorouracil on 10/02/2015.Obviously with fever/PNA-plans for chemo and port placement will need to be postponed. She does exhibit failure to thrive synd, hopefully with IV antibiotics and other supportive measures she will improve, otherwise may need to involve palliative care at some point. Will d/w Dr [name] early next week.  Anorexia: Likely secondary to  underlying malignancy worsened by pneumonia. Continue IV Abx, if no improvement, will start Marinol  Protein-calorie malnutrition, severe: Continue supplements, await nutrition evaluation.  DVT Prophylaxis: Full dose anticoagulation Coumadin  Code Status: Full code or DNR  Family Communication: None at bedside  Disposition Plan: Remain inpatient-SNF on discharge-sometime next week  Antimicrobial agents: IV Vancomycin 5/18>> IV Cefepime 5/18>>  Procedures: Echo-pending  CONSULTS:  None  Time spent: 25 minutes-Greater than 50% of this time was spent in counseling, explanation of diagnosis, planning of further management, and coordination of care.  MEDICATIONS: Anti-infectives    Start     Dose/Rate Route Frequency Ordered Stop   09/15/15 0800  vancomycin (VANCOCIN) 500 mg in sodium chloride 0.9 % 100 mL IVPB     500 mg 100 mL/hr over 60 Minutes Intravenous Every 12 hours 09/14/15 1838 09/22/15 1959   09/15/15 0600  ceFEPIme (MAXIPIME) 1 g in dextrose 5 % 50 mL IVPB     1 g 100 mL/hr over 30 Minutes Intravenous Every 12 hours 09/14/15 1819 09/22/15 1759   09/14/15 2000  vancomycin (VANCOCIN) 1,250 mg in sodium chloride 0.9 % 250 mL IVPB  1,250 mg 166.7 mL/hr over 90 Minutes Intravenous  Once  09/14/15 1836 09/15/15 0026   09/14/15 1845  ceFEPIme (MAXIPIME) 2 g in dextrose 5 % 50 mL IVPB     2 g 100 mL/hr over 30 Minutes Intravenous  Once 09/14/15 1831 09/14/15 1918      Scheduled Meds: . ceFEPime (MAXIPIME) IV  1 g Intravenous Q12H  . feeding supplement  1 Container Oral BID BM  . vancomycin  500 mg Intravenous Q12H  . warfarin  1 mg Oral ONCE-1800  . Warfarin - Pharmacist Dosing Inpatient   Does not apply q1800   Continuous Infusions:  PRN Meds:.HYDROcodone-acetaminophen, magic mouthwash w/lidocaine, morphine, morphine injection, polyvinyl alcohol   PHYSICAL EXAM: Vital signs: Filed Vitals:   09/15/15 0055 09/15/15 0348 09/15/15 0601 09/15/15 1345  BP:   130/67 97/56  Pulse:   74 95  Temp: 100.6 F (38.1 C) 98.6 F (37 C) 98 F (36.7 C) 98 F (36.7 C)  TempSrc: Axillary Oral Oral Oral  Resp:   20 20  Height:      Weight:      SpO2:   98% 97%   Filed Weights   09/14/15 1730  Weight: 59.648 kg (131 lb 8 oz)   Body mass index is 24.05 kg/(m^2).   Gen Exam: Awake and alert with clear speech.Chronically sick looking. Neck: Supple, No JVD.   Chest: B/L Clear.   CVS: S1 S2 Regular, no murmurs.  Abdomen: soft, BS +, non tender, non distended.  Extremities: no edema, lower extremities warm to touch. Neurologic: Non Focal.   Skin: No Rash or lesions   Wounds: N/A.    LABORATORY DATA: CBC:  Recent Labs Lab 09/15/15 0521  WBC 5.9  HGB 8.7*  HCT 26.7*  MCV 102.7*  PLT 112*    Basic Metabolic Panel:  Recent Labs Lab 09/15/15 0521  NA 141  K 3.3*  CL 109  CO2 24  GLUCOSE 75  BUN 25*  CREATININE 0.76  CALCIUM 7.8*    GFR: Estimated Creatinine Clearance: 51 mL/min (by C-G formula based on Cr of 0.76).  Liver Function Tests:  Recent Labs Lab 09/15/15 0521  AST 70*  ALT 69*  ALKPHOS 153*  BILITOT 1.4*  PROT 5.3*  ALBUMIN 2.1*   No results for input(s): LIPASE, AMYLASE in the last 168 hours. No results for input(s): AMMONIA in  the last 168 hours.  Coagulation Profile:  Recent Labs Lab 09/14/15 1904 09/15/15 0521  INR 1.90* 2.54*    Cardiac Enzymes: No results for input(s): CKTOTAL, CKMB, CKMBINDEX, TROPONINI in the last 168 hours.  BNP (last 3 results) No results for input(s): PROBNP in the last 8760 hours.  HbA1C: No results for input(s): HGBA1C in the last 72 hours.  CBG: No results for input(s): GLUCAP in the last 168 hours.  Lipid Profile: No results for input(s): CHOL, HDL, LDLCALC, TRIG, CHOLHDL, LDLDIRECT in the last 72 hours.  Thyroid Function Tests: No results for input(s): TSH, T4TOTAL, FREET4, T3FREE, THYROIDAB in the last 72 hours.  Anemia Panel: No results for input(s): VITAMINB12, FOLATE, FERRITIN, TIBC, IRON, RETICCTPCT in the last 72 hours.  Urine analysis:    Component Value Date/Time   COLORURINE YELLOW 09/14/2015 1834   APPEARANCEUR CLOUDY* 09/14/2015 1834   LABSPEC 1.013 09/14/2015 1834   PHURINE 5.5 09/14/2015 1834   GLUCOSEU NEGATIVE 09/14/2015 1834   HGBUR LARGE* 09/14/2015 1834   BILIRUBINUR NEGATIVE 09/14/2015 [place] 09/14/2015 1834   PROTEINUR  30* 09/14/2015 1834   UROBILINOGEN 0.2 01/23/2015 1830   NITRITE POSITIVE* 09/14/2015 1834   LEUKOCYTESUR NEGATIVE 09/14/2015 1834    Sepsis Labs: Lactic Acid, Venous No results found for: LATICACIDVEN  MICROBIOLOGY: No results found for this or any previous visit (from the past 240 hour(s)).  RADIOLOGY STUDIES/RESULTS: Mr Kizzie Fantasia Contrast  08/25/2015  CLINICAL DATA:  Ongoing headache. Arm pain and pain beneath the sternum and ribs for 2 weeks. Right leg pain. Metastatic breast cancer. EXAM: MRI HEAD WITHOUT AND WITH CONTRAST; MRI CERVICAL SPINE WITHOUT AND WITH CONTRAST; MRI LUMBAR SPINE WITHOUT AND WITH CONTRAST; MRI THORACIC SPINE WITHOUT AND WITH CONTRAST TECHNIQUE: Multiplanar, multiecho pulse sequences of the brain and surrounding structures were obtained according to standard protocol  without and with intravenous contrast; Multiplanar and multiecho pulse sequences of the cervical spine, to include the craniocervical junction and cervicothoracic junction, were obtained according to standard protocol without and with intravenous contrast.; Multiplanar and multiecho pulse sequences of the lumbar spine were obtained without and with intravenous contrast.; Multiplanar and multiecho pulse sequences of the thoracic spine were obtained without and with intravenous contrast. CONTRAST:  30mL MULTIHANCE GADOBENATE DIMEGLUMINE 529 MG/ML IV SOLN COMPARISON:  Head CT 08/05/2015 and MRI 02/14/2009. Cervical spine MRI 05/14/2013. Thoracic and lumbar spine MRI 07/05/2015. FINDINGS: MR HEAD FINDINGS: There is no evidence of acute infarct, intracranial hemorrhage, mass, midline shift, or extra-axial fluid collection. There is mild cerebral atrophy. Small foci of T2 hyperintensity in the cerebral white matter bilaterally, stable to slightly increased from 2010 and nonspecific but compatible with mild-to-moderate chronic small vessel ischemic disease. No enhancing brain lesions are identified. There is at most minimally prominent dural enhancement without nodularity. Small enhancing lesions are present throughout the skull, progressive from 2010 (for example 3 cm left parietal lesion on image 28 of series 41). There is involvement of the clivus. Prior bilateral cataract extraction is noted. There is a trace left mastoid effusion. No significant paranasal sinus inflammatory disease. Major intracranial vascular flow voids are preserved. MR CERVICAL SPINE FINDINGS: Cervical spine straightening is unchanged. There is no significant listhesis. There is progressive osseous metastatic disease throughout the cervical spine since the 2015 MRI. There is now complete marrow replacement involving the C4 vertebral body with patchy enhancement including a 1 cm focus posteriorly. There is also a left-sided posterior element  involvement at C4 which is new. No epidural tumor or spinal cord compression is identified. No cervical spinal cord signal abnormality is identified within limitations of motion artifact. Diffuse cervical disc degeneration is again seen. There is mild neural foraminal stenosis on the right at C3-4, on the left at C4-5, and likely bilaterally at C5-6 and C6-7. This is overall similar to the prior MRI. No significant spinal stenosis is seen. The paraspinal soft tissues are unremarkable. MR THORACIC SPINE FINDINGS: Diffuse osseous metastatic disease is again seen throughout the vertebral bodies and posterior elements of the thoracic spine. There has overall been mild progression from the 07/05/2015 thoracic spine MRI. There is now near complete involvement of the T10 vertebral body. There is mildly progressive marrow replacement involving multiple mid thoracic vertebral bodies as well as T1 and T2. The T3 vertebral body remains completely involved. There is a mild T12 superior endplate compression fracture which demonstrates slightly progressive height loss compared to the prior study. There is slightly prominent ventral epidural enhancement in the mid thoracic spine from T5 -T8, most conspicuous and mildly nodular in a left paracentral location at the T8  superior endplate level (series 37, image 33). This may reflect very small volume epidural tumor, prominent epidural venous plexus, or a combination of both. This has likely not significantly changed from the prior thoracic spine MRI, although there is much more motion artifact on axial sequences on that study. The spinal cord is normal in caliber and signal. There is no spinal cord compression. Multilevel disc degeneration is again seen with disc bulging and small disc protrusions without significant spinal canal or neural foraminal stenosis. Small left adrenal nodule and mild left upper pole renal caliectasis or cysts are partially visualized, grossly similar to the  09/07/2014 PET-CT. MR LUMBAR SPINE FINDINGS: Vertebral alignment is unchanged, with trace anterolisthesis again seen of L3 on L4. Osseous lesions throughout the lumbar spine do not appear significantly changed from the prior lumbar spine MRI, with the largest lesion in the L1 vertebral body. S3 level sacral osseous metastatic disease is partially visualized and grossly similar to the prior study. 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 Lumbar vertebral body heights are preserved. There is diffuse lumbar disc desiccation. Severe disc space height loss at L4-5 is unchanged. Multilevel disc and facet degeneration does not appear significantly changed, with mild right foraminal stenosis again seen at L3-4. There is no spinal stenosis. No epidural tumor is identified. Conus medullaris is normal in signal and terminates at L1. IMPRESSION: 1. No acute intracranial abnormality. No evidence of brain metastases. 2. Numerous osseous skull metastases, progressed from 2010. 3. Widespread spinal osseous metastatic disease, with interval progression in the cervical and thoracic spine. No sizable epidural tumor identified. Slightly prominent ventral epidural enhancement in the mid thoracic spine is favored to mostly reflect epidural venous plexus, however trace epidural tumor is possible particularly at T8. 4. No spinal stenosis. 5. Mild T12 superior endplate compression fracture with slightly progressive height loss from 07/05/2015. Electronically Signed   By: Logan Bores M.D.   On: 08/25/2015 17:08   Mr Cervical Spine W Wo Contrast  08/25/2015  CLINICAL DATA:  Ongoing headache. Arm pain and pain beneath the sternum and ribs for 2 weeks. Right leg pain. Metastatic breast cancer. EXAM: MRI HEAD WITHOUT AND WITH CONTRAST; MRI CERVICAL SPINE WITHOUT AND WITH CONTRAST; MRI LUMBAR SPINE WITHOUT AND WITH CONTRAST; MRI THORACIC SPINE WITHOUT AND WITH CONTRAST TECHNIQUE: Multiplanar, multiecho pulse sequences of the brain  and surrounding structures were obtained according to standard protocol without and with intravenous contrast; Multiplanar and multiecho pulse sequences of the cervical spine, to include the craniocervical junction and cervicothoracic junction, were obtained according to standard protocol without and with intravenous contrast.; Multiplanar and multiecho pulse sequences of the lumbar spine were obtained without and with intravenous contrast.; Multiplanar and multiecho pulse sequences of the thoracic spine were obtained without and with intravenous contrast. CONTRAST:  74mL MULTIHANCE GADOBENATE DIMEGLUMINE 529 MG/ML IV SOLN COMPARISON:  Head CT 08/05/2015 and MRI 02/14/2009. Cervical spine MRI 05/14/2013. Thoracic and lumbar spine MRI 07/05/2015. FINDINGS: MR HEAD FINDINGS: There is no evidence of acute infarct, intracranial hemorrhage, mass, midline shift, or extra-axial fluid collection. There is mild cerebral atrophy. Small foci of T2 hyperintensity in the cerebral white matter bilaterally, stable to slightly increased from 2010 and nonspecific but compatible with mild-to-moderate chronic small vessel ischemic disease. No enhancing brain lesions are identified. There is at most minimally prominent dural enhancement without nodularity. Small enhancing lesions are present throughout the skull, progressive from 2010 (for example 3 cm left parietal lesion on image 28 of series 41). There is  involvement of the clivus. Prior bilateral cataract extraction is noted. There is a trace left mastoid effusion. No significant paranasal sinus inflammatory disease. Major intracranial vascular flow voids are preserved. MR CERVICAL SPINE FINDINGS: Cervical spine straightening is unchanged. There is no significant listhesis. There is progressive osseous metastatic disease throughout the cervical spine since the 2015 MRI. There is now complete marrow replacement involving the C4 vertebral body with patchy enhancement including a 1 cm  focus posteriorly. There is also a left-sided posterior element involvement at C4 which is new. No epidural tumor or spinal cord compression is identified. No cervical spinal cord signal abnormality is identified within limitations of motion artifact. Diffuse cervical disc degeneration is again seen. There is mild neural foraminal stenosis on the right at C3-4, on the left at C4-5, and likely bilaterally at C5-6 and C6-7. This is overall similar to the prior MRI. No significant spinal stenosis is seen. The paraspinal soft tissues are unremarkable. MR THORACIC SPINE FINDINGS: Diffuse osseous metastatic disease is again seen throughout the vertebral bodies and posterior elements of the thoracic spine. There has overall been mild progression from the 07/05/2015 thoracic spine MRI. There is now near complete involvement of the T10 vertebral body. There is mildly progressive marrow replacement involving multiple mid thoracic vertebral bodies as well as T1 and T2. The T3 vertebral body remains completely involved. There is a mild T12 superior endplate compression fracture which demonstrates slightly progressive height loss compared to the prior study. There is slightly prominent ventral epidural enhancement in the mid thoracic spine from T5 -T8, most conspicuous and mildly nodular in a left paracentral location at the T8 superior endplate level (series 37, image 33). This may reflect very small volume epidural tumor, prominent epidural venous plexus, or a combination of both. This has likely not significantly changed from the prior thoracic spine MRI, although there is much more motion artifact on axial sequences on that study. The spinal cord is normal in caliber and signal. There is no spinal cord compression. Multilevel disc degeneration is again seen with disc bulging and small disc protrusions without significant spinal canal or neural foraminal stenosis. Small left adrenal nodule and mild left upper pole renal  caliectasis or cysts are partially visualized, grossly similar to the 09/07/2014 PET-CT. MR LUMBAR SPINE FINDINGS: Vertebral alignment is unchanged, with trace anterolisthesis again seen of L3 on L4. Osseous lesions throughout the lumbar spine do not appear significantly changed from the prior lumbar spine MRI, with the largest lesion in the L1 vertebral body. S3 level sacral osseous metastatic disease is partially visualized and grossly similar to the prior study. 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 Lumbar vertebral body heights are preserved. There is diffuse lumbar disc desiccation. Severe disc space height loss at L4-5 is unchanged. Multilevel disc and facet degeneration does not appear significantly changed, with mild right foraminal stenosis again seen at L3-4. There is no spinal stenosis. No epidural tumor is identified. Conus medullaris is normal in signal and terminates at L1. IMPRESSION: 1. No acute intracranial abnormality. No evidence of brain metastases. 2. Numerous osseous skull metastases, progressed from 2010. 3. Widespread spinal osseous metastatic disease, with interval progression in the cervical and thoracic spine. No sizable epidural tumor identified. Slightly prominent ventral epidural enhancement in the mid thoracic spine is favored to mostly reflect epidural venous plexus, however trace epidural tumor is possible particularly at T8. 4. No spinal stenosis. 5. Mild T12 superior endplate compression fracture with slightly progressive height loss from 07/05/2015.  Electronically Signed   By: Logan Bores M.D.   On: 08/25/2015 17:08   Mr Thoracic Spine W Wo Contrast  08/25/2015  CLINICAL DATA:  Ongoing headache. Arm pain and pain beneath the sternum and ribs for 2 weeks. Right leg pain. Metastatic breast cancer. EXAM: MRI HEAD WITHOUT AND WITH CONTRAST; MRI CERVICAL SPINE WITHOUT AND WITH CONTRAST; MRI LUMBAR SPINE WITHOUT AND WITH CONTRAST; MRI THORACIC SPINE WITHOUT AND WITH  CONTRAST TECHNIQUE: Multiplanar, multiecho pulse sequences of the brain and surrounding structures were obtained according to standard protocol without and with intravenous contrast; Multiplanar and multiecho pulse sequences of the cervical spine, to include the craniocervical junction and cervicothoracic junction, were obtained according to standard protocol without and with intravenous contrast.; Multiplanar and multiecho pulse sequences of the lumbar spine were obtained without and with intravenous contrast.; Multiplanar and multiecho pulse sequences of the thoracic spine were obtained without and with intravenous contrast. CONTRAST:  39mL MULTIHANCE GADOBENATE DIMEGLUMINE 529 MG/ML IV SOLN COMPARISON:  Head CT 08/05/2015 and MRI 02/14/2009. Cervical spine MRI 05/14/2013. Thoracic and lumbar spine MRI 07/05/2015. FINDINGS: MR HEAD FINDINGS: There is no evidence of acute infarct, intracranial hemorrhage, mass, midline shift, or extra-axial fluid collection. There is mild cerebral atrophy. Small foci of T2 hyperintensity in the cerebral white matter bilaterally, stable to slightly increased from 2010 and nonspecific but compatible with mild-to-moderate chronic small vessel ischemic disease. No enhancing brain lesions are identified. There is at most minimally prominent dural enhancement without nodularity. Small enhancing lesions are present throughout the skull, progressive from 2010 (for example 3 cm left parietal lesion on image 28 of series 41). There is involvement of the clivus. Prior bilateral cataract extraction is noted. There is a trace left mastoid effusion. No significant paranasal sinus inflammatory disease. Major intracranial vascular flow voids are preserved. MR CERVICAL SPINE FINDINGS: Cervical spine straightening is unchanged. There is no significant listhesis. There is progressive osseous metastatic disease throughout the cervical spine since the 2015 MRI. There is now complete marrow replacement  involving the C4 vertebral body with patchy enhancement including a 1 cm focus posteriorly. There is also a left-sided posterior element involvement at C4 which is new. No epidural tumor or spinal cord compression is identified. No cervical spinal cord signal abnormality is identified within limitations of motion artifact. Diffuse cervical disc degeneration is again seen. There is mild neural foraminal stenosis on the right at C3-4, on the left at C4-5, and likely bilaterally at C5-6 and C6-7. This is overall similar to the prior MRI. No significant spinal stenosis is seen. The paraspinal soft tissues are unremarkable. MR THORACIC SPINE FINDINGS: Diffuse osseous metastatic disease is again seen throughout the vertebral bodies and posterior elements of the thoracic spine. There has overall been mild progression from the 07/05/2015 thoracic spine MRI. There is now near complete involvement of the T10 vertebral body. There is mildly progressive marrow replacement involving multiple mid thoracic vertebral bodies as well as T1 and T2. The T3 vertebral body remains completely involved. There is a mild T12 superior endplate compression fracture which demonstrates slightly progressive height loss compared to the prior study. There is slightly prominent ventral epidural enhancement in the mid thoracic spine from T5 -T8, most conspicuous and mildly nodular in a left paracentral location at the T8 superior endplate level (series 37, image 33). This may reflect very small volume epidural tumor, prominent epidural venous plexus, or a combination of both. This has likely not significantly changed from the prior thoracic spine MRI,  although there is much more motion artifact on axial sequences on that study. The spinal cord is normal in caliber and signal. There is no spinal cord compression. Multilevel disc degeneration is again seen with disc bulging and small disc protrusions without significant spinal canal or neural foraminal  stenosis. Small left adrenal nodule and mild left upper pole renal caliectasis or cysts are partially visualized, grossly similar to the 09/07/2014 PET-CT. MR LUMBAR SPINE FINDINGS: Vertebral alignment is unchanged, with trace anterolisthesis again seen of L3 on L4. Osseous lesions throughout the lumbar spine do not appear significantly changed from the prior lumbar spine MRI, with the largest lesion in the L1 vertebral body. S3 level sacral osseous metastatic disease is partially visualized and grossly similar to the prior study. 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 Lumbar vertebral body heights are preserved. There is diffuse lumbar disc desiccation. Severe disc space height loss at L4-5 is unchanged. Multilevel disc and facet degeneration does not appear significantly changed, with mild right foraminal stenosis again seen at L3-4. There is no spinal stenosis. No epidural tumor is identified. Conus medullaris is normal in signal and terminates at L1. IMPRESSION: 1. No acute intracranial abnormality. No evidence of brain metastases. 2. Numerous osseous skull metastases, progressed from 2010. 3. Widespread spinal osseous metastatic disease, with interval progression in the cervical and thoracic spine. No sizable epidural tumor identified. Slightly prominent ventral epidural enhancement in the mid thoracic spine is favored to mostly reflect epidural venous plexus, however trace epidural tumor is possible particularly at T8. 4. No spinal stenosis. 5. Mild T12 superior endplate compression fracture with slightly progressive height loss from 07/05/2015. Electronically Signed   By: Logan Bores M.D.   On: 08/25/2015 17:08   Mr Lumbar Spine W Wo Contrast  08/25/2015  CLINICAL DATA:  Ongoing headache. Arm pain and pain beneath the sternum and ribs for 2 weeks. Right leg pain. Metastatic breast cancer. EXAM: MRI HEAD WITHOUT AND WITH CONTRAST; MRI CERVICAL SPINE WITHOUT AND WITH CONTRAST; MRI LUMBAR SPINE  WITHOUT AND WITH CONTRAST; MRI THORACIC SPINE WITHOUT AND WITH CONTRAST TECHNIQUE: Multiplanar, multiecho pulse sequences of the brain and surrounding structures were obtained according to standard protocol without and with intravenous contrast; Multiplanar and multiecho pulse sequences of the cervical spine, to include the craniocervical junction and cervicothoracic junction, were obtained according to standard protocol without and with intravenous contrast.; Multiplanar and multiecho pulse sequences of the lumbar spine were obtained without and with intravenous contrast.; Multiplanar and multiecho pulse sequences of the thoracic spine were obtained without and with intravenous contrast. CONTRAST:  51mL MULTIHANCE GADOBENATE DIMEGLUMINE 529 MG/ML IV SOLN COMPARISON:  Head CT 08/05/2015 and MRI 02/14/2009. Cervical spine MRI 05/14/2013. Thoracic and lumbar spine MRI 07/05/2015. FINDINGS: MR HEAD FINDINGS: There is no evidence of acute infarct, intracranial hemorrhage, mass, midline shift, or extra-axial fluid collection. There is mild cerebral atrophy. Small foci of T2 hyperintensity in the cerebral white matter bilaterally, stable to slightly increased from 2010 and nonspecific but compatible with mild-to-moderate chronic small vessel ischemic disease. No enhancing brain lesions are identified. There is at most minimally prominent dural enhancement without nodularity. Small enhancing lesions are present throughout the skull, progressive from 2010 (for example 3 cm left parietal lesion on image 28 of series 41). There is involvement of the clivus. Prior bilateral cataract extraction is noted. There is a trace left mastoid effusion. No significant paranasal sinus inflammatory disease. Major intracranial vascular flow voids are preserved. MR CERVICAL SPINE FINDINGS: Cervical spine  straightening is unchanged. There is no significant listhesis. There is progressive osseous metastatic disease throughout the cervical  spine since the 2015 MRI. There is now complete marrow replacement involving the C4 vertebral body with patchy enhancement including a 1 cm focus posteriorly. There is also a left-sided posterior element involvement at C4 which is new. No epidural tumor or spinal cord compression is identified. No cervical spinal cord signal abnormality is identified within limitations of motion artifact. Diffuse cervical disc degeneration is again seen. There is mild neural foraminal stenosis on the right at C3-4, on the left at C4-5, and likely bilaterally at C5-6 and C6-7. This is overall similar to the prior MRI. No significant spinal stenosis is seen. The paraspinal soft tissues are unremarkable. MR THORACIC SPINE FINDINGS: Diffuse osseous metastatic disease is again seen throughout the vertebral bodies and posterior elements of the thoracic spine. There has overall been mild progression from the 07/05/2015 thoracic spine MRI. There is now near complete involvement of the T10 vertebral body. There is mildly progressive marrow replacement involving multiple mid thoracic vertebral bodies as well as T1 and T2. The T3 vertebral body remains completely involved. There is a mild T12 superior endplate compression fracture which demonstrates slightly progressive height loss compared to the prior study. There is slightly prominent ventral epidural enhancement in the mid thoracic spine from T5 -T8, most conspicuous and mildly nodular in a left paracentral location at the T8 superior endplate level (series 37, image 33). This may reflect very small volume epidural tumor, prominent epidural venous plexus, or a combination of both. This has likely not significantly changed from the prior thoracic spine MRI, although there is much more motion artifact on axial sequences on that study. The spinal cord is normal in caliber and signal. There is no spinal cord compression. Multilevel disc degeneration is again seen with disc bulging and small  disc protrusions without significant spinal canal or neural foraminal stenosis. Small left adrenal nodule and mild left upper pole renal caliectasis or cysts are partially visualized, grossly similar to the 09/07/2014 PET-CT. MR LUMBAR SPINE FINDINGS: Vertebral alignment is unchanged, with trace anterolisthesis again seen of L3 on L4. Osseous lesions throughout the lumbar spine do not appear significantly changed from the prior lumbar spine MRI, with the largest lesion in the L1 vertebral body. S3 level sacral osseous metastatic disease is partially visualized and grossly similar to the prior study. 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 Lumbar vertebral body heights are preserved. There is diffuse lumbar disc desiccation. Severe disc space height loss at L4-5 is unchanged. Multilevel disc and facet degeneration does not appear significantly changed, with mild right foraminal stenosis again seen at L3-4. There is no spinal stenosis. No epidural tumor is identified. Conus medullaris is normal in signal and terminates at L1. IMPRESSION: 1. No acute intracranial abnormality. No evidence of brain metastases. 2. Numerous osseous skull metastases, progressed from 2010. 3. Widespread spinal osseous metastatic disease, with interval progression in the cervical and thoracic spine. No sizable epidural tumor identified. Slightly prominent ventral epidural enhancement in the mid thoracic spine is favored to mostly reflect epidural venous plexus, however trace epidural tumor is possible particularly at T8. 4. No spinal stenosis. 5. Mild T12 superior endplate compression fracture with slightly progressive height loss from 07/05/2015. Electronically Signed   By: Logan Bores M.D.   On: 08/25/2015 17:08   Nm Pet Image Restag (ps) Skull Base To Thigh  08/31/2015  CLINICAL DATA:  Subsequent treatment strategy for metastatic  breast carcinoma to bone. EXAM: NUCLEAR MEDICINE PET SKULL BASE TO THIGH TECHNIQUE: 6.3 mCi  F-18 FDG was injected intravenously. Full-ring PET imaging was performed from the skull base to thigh after the radiotracer. CT data was obtained and used for attenuation correction and anatomic localization. FASTING BLOOD GLUCOSE:  Value: 103 mg/dl COMPARISON:  09/07/2014 FINDINGS: NECK No hypermetabolic lymph nodes in the neck. CHEST No hypermetabolic mediastinal or hilar nodes. No suspicious pulmonary nodules on the CT scan. Biapical pleural- parenchymal scarring appears stable. Previously seen area of hypermetabolic airspace disease in the left upper lobe has nearly completely resolved, consistent with resolving infectious or inflammatory process. ABDOMEN/PELVIS No abnormal hypermetabolic activity within the liver, pancreas, adrenal glands, or spleen. No hypermetabolic lymph nodes in the abdomen or pelvis. Tiny left hepatic lobe cyst remains stable. Prior cholecystectomy noted. 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 11 mm left adrenal nodule remains stable and shows no metabolic activity, consistent with benign adrenal adenoma. Colonic diverticulosis noted, without evidence of diverticulitis. SKELETON Mixed lytic and sclerotic bone metastases again seen within spine, pelvis, sternum, and proximal left femur. These appear stable and show no significant change in low-grade metabolic activity, consistent with largely treated osseous metastatic disease. IMPRESSION: Near complete resolution of hypermetabolic airspace disease in left upper lobe, consistent with resolving infectious or inflammatory process. No significant change in appearance of largely treated osseous metastatic disease. No new or progressive metastatic disease identified. Electronically Signed   By: Earle Gell M.D.   On: 08/31/2015 12:48   Dg Chest Port 1 View  09/15/2015  CLINICAL DATA:  Short breath, cough, fever EXAM: PORTABLE CHEST 1 VIEW COMPARISON:  08/31/2015 FINDINGS: Normal cardiac silhouette with ectatic aorta. Increased density in the  LEFT upper lobe. The biapical densities are not changed from CT. Small LEFT effusion. No pneumothorax. IMPRESSION: 1. Increasing density in the LEFT upper lobe. Concerning for edema or infection. Electronically Signed   By: Suzy Bouchard M.D.   On: 09/15/2015 07:26   US Abdomen Limited Ruq  09/15/2015  CLINICAL DATA:  Elevated liver function tests. EXAM: US ABDOMEN LIMITED - RIGHT UPPER QUADRANT COMPARISON:  08/31/2015, PET-CT. FINDINGS: Gallbladder: Surgically absent Common bile duct: Diameter: Dilated to a maximum of 17 mm, unchanged from the recent prior PET-CT. No sonographic evidence of a duct stone. Liver: Cyst in the left liver lobe measuring 13 somewhat mottled appearance of the liver in its more posterior aspect, which could be technique. The possibility of numerous small infiltrating lesion should be considered in the patient's clinical setting. Liver is normal in size and overall echogenicity. IMPRESSION: 1. Chronically dilated common bile duct. Status post cholecystectomy. 2. Mottled appearance of the posterior liver. Consider followup liver MRI with and without contrast. Electronically Signed   By: Lajean Manes M.D.   On: 09/15/2015 10:04     LOS: 1 day   Oren Binet, MD  [facility] Pager:[phone]  If 7PM-7AM, please contact night-coverage [email or web address] Password [email or web address] 09/15/2015, 1:48 PM

## 2015-09-15 NOTE — Progress Notes (Signed)
Echocardiogram 2D Echocardiogram has been performed.  [name] 09/15/2015, 1:43 PM

## 2015-09-15 NOTE — Evaluation (Signed)
Clinical/Bedside Swallow Evaluation Patient Details  Name: [name] MRN: [id number] Date of Birth: [birth year]  Today's Date: 09/15/2015 Time: SLP Start Time (ACUTE ONLY): 1101 SLP Stop Time (ACUTE ONLY): 1112 SLP Time Calculation (min) (ACUTE ONLY): 11 min  Past Medical History:  Past Medical History  Diagnosis Date  . Cancer (HCC)     RBreast, Lower back , Under l Arm  . Hypertension   . GERD (gastroesophageal reflux disease)   . Breast cancer metastasized to bone ([place]) 04/30/2011  . Radiation 01/15/2012    Lumbosacral spine 3500 cGy 14  fx  . Swollen R ankle 08/10/2012  . Fracture of ankle, medial malleolus, right, closed 08/10/2012  . Tubular adenoma 2011  . Hiatal hernia 2011  . Schatzki's ring   . Anorexia 03/02/2013  . Weight loss due to medication 03/02/2013  . C. difficile colitis   . Pneumonia   . Metastasis ([place])     to skull   Past Surgical History:  Past Surgical History  Procedure Laterality Date  . Gallbladder surgery  1991  . Mastectomy  06/28/10    bilateral- Dr. Rosebud Poles, [place]  . Inguinal hernia repair  90's    right  . Parathyroidectomy  90's  . Carpal tunnel release  90's  . Oophorectomy  90's    right  . Lymph node dissection    . Breast surgery    . Cholecystectomy    . Colonoscopy  01/08/10    Dr. [name]- anal tag,hemorrhoid o/w normal rectum, pancolonic diverticula, diminutive polyp in the base of the cecum= tubular adenoma on bx. poor prep  . Esophagogastroduodenoscopy  01/08/10    Dr. [name]- normal esophagus, small hiatal hernia, antum and body erosions, pedunculated polyp between D1 and D2, duodenal diverticulum, lymphangiectasia, second portion of the duodenum.stomach bx= inflammation, duodenum bx= adenoma  . Rotator cuff repair  08/27/11    Dr. Alphonzo Cruise- [facility]- [place] [facility]  . Esophagogastroduodenoscopy (egd) with propofol N/A 11/19/2012    RMR: non-critical Schatzki's ring s/p dilation with 44 F, multiple gastric polyps, duodenal polyp s/p  piecemeal snare polypectomy, gastric polypectomy, path benign  . Maloney dilation N/A 11/19/2012    Procedure: Venia Minks DILATION;  Surgeon: Daneil Dolin, MD;  Location: [facility];  Service: Endoscopy;  Laterality: N/A;  #54  . Polypectomy N/A 11/19/2012    Procedure: POLYPECTOMY;  Surgeon: Daneil Dolin, MD;  Location: [facility];  Service: Endoscopy;  Laterality: N/A;  duodenal  . Biopsy N/A 11/19/2012    Procedure: BIOPSY;  Surgeon: Daneil Dolin, MD;  Location: [facility];  Service: Endoscopy;  Laterality: N/A;  esophageal   HPI:  70 y.o. 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 Pt admitted with fever, chills, worsening weakness, cough and confusion. Per chart pt scheduled to get a Port-A-Cath earlier this morning, and also scheduled to see radiation oncology doctor for palliative radiation. CXR Increasing density in the LEFT upper lobe. Concerning for edema or infection.   Assessment / Plan / Recommendation Clinical Impression  Pt complained of "left side of throat hurting and sores in my mouth." One small red spot on left cheek observed. Took reflux meds in the past but stopped- no present concerns or voiced complaints with oral or pharyngeal swallow. No s/s aspiration observed. Educated pt re: reflux strategies. Continue regular and thin liquids, no further ST warranted.  Aspiration Risk  Mild aspiration risk    Diet Recommendation Regular;Thin liquid   Liquid Administration via: Cup;Straw Medication Administration: Whole meds with liquid Supervision: Patient able to self feed Compensations: Slow rate;Small sips/bites Postural Changes: Seated upright at 90 degrees;Remain upright for at least 30 minutes after po intake    Other  Recommendations Oral Care Recommendations: Oral care BID   Follow up  Recommendations  None    Frequency and Duration            Prognosis        Swallow Study   General HPI: 70 y.o. 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 Pt admitted with fever, chills, worsening weakness, cough and confusion. Per chart pt scheduled to get a Port-A-Cath earlier this morning, and also scheduled to see radiation oncology doctor for palliative radiation. CXR Increasing density in the LEFT upper lobe. Concerning for edema or infection. Type of Study: Bedside Swallow Evaluation Previous Swallow Assessment:  (none) Diet Prior to this Study: Regular;Thin liquids Temperature Spikes Noted: Yes Respiratory Status: Nasal cannula History of Recent Intubation: No Behavior/Cognition: Alert;Cooperative Oral Cavity Assessment: Within Functional Limits (pt complained of sores, one red spot observed left cheek) Oral Care Completed by SLP: No Oral Cavity - Dentition: Adequate natural dentition (missing several) Vision: Functional for self-feeding Self-Feeding Abilities: Able to feed self Patient Positioning: Upright in bed Baseline Vocal Quality: Normal Volitional Cough: Weak Volitional Swallow: Able to elicit    Oral/Motor/Sensory Function Overall Oral Motor/Sensory Function: Within functional limits   Ice Chips Ice chips: Not tested   Thin Liquid Thin Liquid: Within functional limits Presentation: Cup;Straw    Nectar Thick Nectar Thick Liquid: Not tested   Honey Thick Honey Thick Liquid: Not tested   Puree Puree: Not tested   Solid   GO   Solid: Within functional limits (slightly prolonged-functional)        Mick Sell, Orbie Pyo 09/15/2015,11:27 AM    Orbie Pyo [name].Ed [employer] [phone]

## 2015-09-15 NOTE — Evaluation (Signed)
Physical Therapy Evaluation Patient Details Name: [name] MRN: [id number] DOB: Sep 17, 1945 Today's Date: 09/15/2015   History of Present Illness  70 yo female admitted with Pna, SIRS. Hx of breast cancer with bony mets, FTT, HTN  Clinical Impression  On eval, pt required Mod assist for bed mobility (rolling) to L and R side. Increased time. Mobility significantly limited by pain and weakness at this time. Will follow and progress activity as tolerated. Pt will need SNF for continued rehab.     Follow Up Recommendations SNF    Equipment Recommendations       Recommendations for Other Services OT consult     Precautions / Restrictions Precautions Precautions: Fall Precaution Comments: blisters on buttocks Restrictions Weight Bearing Restrictions: No      Mobility  Bed Mobility Overal bed mobility: Needs Assistance Bed Mobility: Rolling Rolling: Mod assist         General bed mobility comments: Assist to position LEs and position body. Utilized bedpad to aid with turning. Increased time to complete task due to weaknes, pain.   Transfers                 General transfer comment: NT-pt unable to tolerate on today.   Ambulation/Gait                Stairs            Wheelchair Mobility    Modified Rankin (Stroke Patients Only)       Balance                                             Pertinent Vitals/Pain Pain Assessment: Faces Faces Pain Scale: Hurts whole lot Pain Location: "al over" with and without activity Pain Descriptors / Indicators: Sore;Tender Pain Intervention(s): Limited activity within patient's tolerance;Repositioned    Home Living Family/patient expects to be discharged to:: Skilled nursing facility Living Arrangements: Alone   Type of Home: House         Home Equipment: None      Prior Function Level of Independence: Independent         Comments: 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, newspaper        Extremity/Trunk Assessment   Upper Extremity Assessment: Generalized weakness           Lower Extremity Assessment: RLE deficits/detail;LLE deficits/detail RLE Deficits / Details: Strength ~2/5 throughout LLE Deficits / Details: Strength ~2/5 throughout     Communication   Communication: No difficulties  Cognition Arousal/Alertness: Awake/alert Behavior During Therapy: WFL for tasks assessed/performed Overall Cognitive Status: Within Functional Limits for tasks assessed                      General Comments      Exercises General Exercises - Upper Extremity Shoulder Flexion: AAROM;5 reps;Supine;Both General Exercises - Lower Extremity Heel Slides: AAROM;Both;5 reps;Supine      Assessment/Plan    PT Assessment Patient needs continued PT services  PT Diagnosis Difficulty walking;Generalized weakness;Acute pain   PT Problem List Decreased strength;Decreased range of motion;Decreased activity tolerance;Decreased balance;Decreased mobility;Decreased knowledge of use of DME;Pain;Decreased skin integrity  PT Treatment Interventions DME instruction;Gait training;Functional mobility training;Therapeutic activities;Patient/family education;Balance training;Therapeutic exercise   PT Goals (Current goals can be found in the Care Plan  section) Acute Rehab PT Goals Patient Stated Goal: to get strength back PT Goal Formulation: With patient Time For Goal Achievement: 09/29/15 Potential to Achieve Goals: Good    Frequency Min 3X/week   Barriers to discharge        Co-evaluation               End of Session   Activity Tolerance: Patient limited by fatigue;Patient limited by pain Patient left: in bed;with call bell/phone within reach;with bed alarm set           Time: [id number] PT Time Calculation (min) (ACUTE ONLY): 20 min   Charges:   PT Evaluation $PT Eval Low Complexity: 1 Procedure      PT G Codes:        Weston Anna, MPT Pager: [phone]

## 2015-09-15 NOTE — Progress Notes (Signed)
ANTICOAGULATION CONSULT NOTE - Initial Consult  Pharmacy Consult for Warfarin Indication: hx extensive superficial vein thrombosis  Allergies  Allergen Reactions  . Aspirin Nausea And Vomiting  . Boost Pudding [Nutritional Supplements] Diarrhea  . Percodan [Oxycodone-Aspirin] Nausea Only  . Codeine Nausea Only   Patient Measurements: Height: 5\' 2"  (157.5 cm) Weight: 131 lb 8 oz (59.648 kg) IBW/kg (Calculated) : 50.1  Vital Signs: Temp: 98 F (36.7 C) (05/19 0601) Temp Source: Oral (05/19 0601) BP: 130/67 mmHg (05/19 0601) Pulse Rate: 74 (05/19 0601)  Labs:  Recent Labs  09/14/15 1904 09/15/15 0521  HGB  --  8.7*  HCT  --  26.7*  PLT  --  112*  LABPROT 21.1* 26.2*  INR 1.90* 2.54*  CREATININE  --  0.76    Estimated Creatinine Clearance: 51 mL/min (by C-G formula based on Cr of 0.76).  Medical History: Past Medical History  Diagnosis Date  . Cancer (HCC)     RBreast, Lower back , Under l Arm  . Hypertension   . GERD (gastroesophageal reflux disease)   . Breast cancer metastasized to bone ([place]) 04/30/2011  . Radiation 01/15/2012    Lumbosacral spine 3500 cGy 14  fx  . Swollen R ankle 08/10/2012  . Fracture of ankle, medial malleolus, right, closed 08/10/2012  . Tubular adenoma 2011  . Hiatal hernia 2011  . Schatzki's ring   . Anorexia 03/02/2013  . Weight loss due to medication 03/02/2013  . C. difficile colitis   . Pneumonia   . Metastasis ([place])     to skull   Medications:  Scheduled:  . ceFEPime (MAXIPIME) IV  1 g Intravenous Q12H  . feeding supplement  1 Container Oral BID BM  . potassium chloride  40 mEq Oral Once  . vancomycin  500 mg Intravenous Q12H  . Warfarin - Pharmacist Dosing Inpatient   Does not apply q1800   Assessment: 63 yoF with hx of mets breast cancer on letrozole+ palbocicib, denosumab with plan for PAC placement on 5/18 for chemo to start in June. She was also on warfarin PTA for hx extensive vein thrombosis. Outpatient records patient  was supposed to hold her warfarin three days prior to [facility] procedure.  However,  her INR was supratherapeutic at 9 on 5/10 and patient was instructed to hold her warfarin at that time.  Patient has not gotten PAC yet. She presented to the ED on 5/18 with c/o fever, chills, cough and worsening weakness. INR was 1.90 on admission  Home warfarin regimen: 2 mg daily except 4 mg on FSS   Today, 09/15/2015: - INR now but up from 1.90 to 2.54 after on one dose of 2 mg given last night - hgb 8.7, plt 112 - no bleeding documented - drug-drug intxns: abx - cardiac diet   Goal of Therapy:  INR 2-3 Monitor platelets by anticoagulation protocol: Yes   Plan:  - warfarin 1 mg PO x1 today - Please advise if/when warfarin needs to be held, if to proceed with port-a-cath placement with this admission - daily INR - monitor for s/s bleeding  Dia Sitter, PharmD, BCPS 09/15/2015 8:02 AM

## 2015-09-16 LAB — COMPREHENSIVE METABOLIC PANEL
ALK PHOS: 142 U/L — AB (ref 38–126)
ALT: 54 U/L (ref 14–54)
ANION GAP: 8 (ref 5–15)
AST: 38 U/L (ref 15–41)
Albumin: 1.9 g/dL — ABNORMAL LOW (ref 3.5–5.0)
BILIRUBIN TOTAL: 0.8 mg/dL (ref 0.3–1.2)
BUN: 18 mg/dL (ref 6–20)
CALCIUM: 8.6 mg/dL — AB (ref 8.9–10.3)
CO2: 27 mmol/L (ref 22–32)
Chloride: 103 mmol/L (ref 101–111)
Creatinine, Ser: 0.62 mg/dL (ref 0.44–1.00)
GFR calc Af Amer: >60 mL/min (ref >60)
Glucose, Bld: 101 mg/dL — ABNORMAL HIGH (ref 65–99)
POTASSIUM: 3.1 mmol/L — AB (ref 3.5–5.1)
Sodium: 138 mmol/L (ref 135–145)
TOTAL PROTEIN: 4.9 g/dL — AB (ref 6.5–8.1)

## 2015-09-16 LAB — PROTIME-INR
INR: 1.95 — ABNORMAL HIGH (ref 0.00–1.49)
PROTHROMBIN TIME: 21.5 s — AB (ref 11.6–15.2)

## 2015-09-16 LAB — LEGIONELLA PNEUMOPHILA SEROGP 1 UR AG: L. pneumophila Serogp 1 Ur Ag: NEGATIVE

## 2015-09-16 MED ORDER — FENTANYL 25 MCG/HR TD PT72
25.0000 ug | MEDICATED_PATCH | TRANSDERMAL | Status: DC
  Administered 2015-09-16 – 2015-09-19 (×2): 25 ug via TRANSDERMAL
  Filled 2015-09-16 (×3): qty 1

## 2015-09-16 MED ORDER — DEXAMETHASONE SODIUM PHOSPHATE 4 MG/ML IJ SOLN: 4.0000 mg | Freq: Two times a day (BID) | INTRAMUSCULAR | Status: DC

## 2015-09-16 MED ORDER — POTASSIUM CHLORIDE CRYS ER 20 MEQ PO TBCR
40.0000 meq | EXTENDED_RELEASE_TABLET | Freq: Once | ORAL | Status: AC
  Administered 2015-09-16: 40 meq via ORAL
  Filled 2015-09-16: qty 2

## 2015-09-16 MED ORDER — LIDOCAINE 5 % EX PTCH
1.0000 | MEDICATED_PATCH | CUTANEOUS | Status: DC
  Administered 2015-09-16 – 2015-09-22 (×7): 1 via TRANSDERMAL
  Filled 2015-09-16 (×5): qty 1

## 2015-09-16 MED ORDER — HYDROMORPHONE HCL 1 MG/ML IJ SOLN
1.0000 mg | INTRAMUSCULAR | Status: DC | PRN
  Administered 2015-09-16 – 2015-09-21 (×11): 1 mg via INTRAVENOUS
  Filled 2015-09-16 (×11): qty 1

## 2015-09-16 MED ORDER — DEXAMETHASONE SODIUM PHOSPHATE 4 MG/ML IJ SOLN
4.0000 mg | Freq: Two times a day (BID) | INTRAMUSCULAR | Status: DC
  Administered 2015-09-16 – 2015-09-18 (×4): 4 mg via INTRAVENOUS
  Filled 2015-09-16 (×4): qty 1

## 2015-09-16 MED ORDER — DEXAMETHASONE SODIUM PHOSPHATE 4 MG/ML IJ SOLN
10.0000 mg | Freq: Once | INTRAMUSCULAR | Status: AC
  Administered 2015-09-16: 10 mg via INTRAVENOUS
  Filled 2015-09-16: qty 3

## 2015-09-16 MED ORDER — WARFARIN SODIUM 2 MG PO TABS
2.0000 mg | ORAL_TABLET | Freq: Once | ORAL | Status: AC
  Administered 2015-09-16: 2 mg via ORAL
  Filled 2015-09-16: qty 1

## 2015-09-16 MED ORDER — MORPHINE SULFATE 15 MG PO TABS
15.0000 mg | ORAL_TABLET | ORAL | Status: DC | PRN
Start: 2015-09-16 — End: 2015-09-22
  Administered 2015-09-16 – 2015-09-22 (×16): 15 mg via ORAL
  Filled 2015-09-16 (×16): qty 1

## 2015-09-16 NOTE — Progress Notes (Signed)
ANTICOAGULATION CONSULT NOTE - Follow-Up  Pharmacy Consult for Warfarin Indication: hx extensive superficial vein thrombosis  Allergies  Allergen Reactions  . Aspirin Nausea And Vomiting  . Boost Pudding [Nutritional Supplements] Diarrhea  . Percodan [Oxycodone-Aspirin] Nausea Only  . Codeine Nausea Only   Patient Measurements: Height: 5\' 2"  (157.5 cm) Weight: 131 lb 8 oz (59.648 kg) IBW/kg (Calculated) : 50.1  Vital Signs: Temp: 99 F (37.2 C) (05/20 0601) Temp Source: Oral (05/20 0601) BP: 128/83 mmHg (05/20 0601) Pulse Rate: 99 (05/20 0601)  Labs:  Recent Labs  09/14/15 1904 09/15/15 0521 09/16/15 0532  HGB  --  8.7*  --   HCT  --  26.7*  --   PLT  --  112*  --   LABPROT 21.1* 26.2* 21.5*  INR 1.90* 2.54* 1.95*  CREATININE  --  0.76 0.62    Estimated Creatinine Clearance: 51 mL/min (by C-G formula based on Cr of 0.62).  Medical History: Past Medical History  Diagnosis Date  . Cancer (HCC)     RBreast, Lower back , Under l Arm  . Hypertension   . GERD (gastroesophageal reflux disease)   . Breast cancer metastasized to bone ([place]) 04/30/2011  . Radiation 01/15/2012    Lumbosacral spine 3500 cGy 14  fx  . Swollen R ankle 08/10/2012  . Fracture of ankle, medial malleolus, right, closed 08/10/2012  . Tubular adenoma 2011  . Hiatal hernia 2011  . Schatzki's ring   . Anorexia 03/02/2013  . Weight loss due to medication 03/02/2013  . C. difficile colitis   . Pneumonia   . 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 ceFEPime (MAXIPIME) IV  1 g Intravenous Q12H  . dexamethasone  4 mg Intravenous Q12H  . feeding supplement  1 Container Oral BID BM  . fentaNYL  25 mcg Transdermal Q72H  . lidocaine  1 patch Transdermal Q24H  . vancomycin  500 mg Intravenous Q12H  . Warfarin - Pharmacist Dosing Inpatient   Does not apply q1800   Assessment: 54 yoF with hx of mets breast cancer on letrozole+ palbocicib, denosumab with plan for PAC placement on  5/18 for chemo to start in June. She was also on warfarin PTA for hx extensive vein thrombosis. Outpatient records patient was supposed to hold her warfarin three days prior to [facility] procedure.  However,  her INR was supratherapeutic at 9 on 5/10 and patient was instructed to hold her warfarin at that time.  Patient has not gotten PAC yet. She presented to the ED on 5/18 with c/o fever, chills, cough and worsening weakness. INR was 1.90 on admission  Home warfarin regimen: 2 mg daily except 4 mg on FSS   Today, 09/16/2015: - INR decreased from 2.54 to 1.92 after dose of 1 mg  - hgb 8.7, plt 112 from 5/19, none today - no bleeding documented - drug-drug intxns: abx - cardiac diet   Goal of Therapy:  INR 2-3 Monitor platelets by anticoagulation protocol: Yes   Plan:  - warfarin 2 mg PO x1 today - Please advise if/when warfarin needs to be held, if to proceed with port-a-cath placement with this admission - daily INR - monitor for s/s bleeding  Royetta Asal, PharmD, BCPS Pager [phone] 09/16/2015 11:36 AM

## 2015-09-16 NOTE — Progress Notes (Signed)
PROGRESS NOTE        PATIENT DETAILS Name: [name] Age: 70 y.o. Sex: female Date of Birth: [birth year] Admit Date: 09/14/2015 Admitting Physician Evalee Mutton Kristeen Mans, MD ZJ:[id number] E, MD  Brief Narrative: Patient is a 70 y.o. female with history of metastatic breast cancer, admitted with healthcare associated pneumonia, generalized weakness and generalized bony pain.  Subjective: Feels weak, hurts "everywhere". Now afebrile for more than 1 day  Assessment/Plan: Principal Problem: SIR's secondary to HCAP (healthcare-associated pneumonia): Although clinically improved without fever curve better, main issues continue to be  generalized weakness and diffuse pain. Blood cultures done here and at [facility] (per RN who called) are negative, suspect that we can now discontinue Vancomycin and just continue cefepime. Urine streptococcal antigen negative, Legionella antigen pending. Continue to follow clinical course, will need to be mobilized by physical therapy.  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 Unfortunately, her deconditioning has only worsened due to acute illness and pneumonia. PT evaluation completed, recommendations are for SNF.  Generalized/Diffuse Pain: suspect secondary to multiple osseous mets, will try to bolus with 10 mg IV Decadron, and then continue scheduled decadron. Start low dose transdermal fentanyl- and prn IV Dilaudid for severe pain. If no response will try bisphosphonate-which she had refused in the past. Have asked palliative care to assist in this regard as well.  Mildly elevated LFTs: Now normalized-likely due to acute infection, but given history of malignancy-liver metastases is a cause of concern. RUQ ultrasound no obvious  metastatic lesions. Recent PET scan on 08/31/15 did not show any major hepatic lesions as well. For now continue to follow.  Hypokalemia:replete and recheck  Anemia: Likely secondary to chronic disease-worsened by acute illness, no evidence of blood loss. Follow CBC  HTN: Continue to hold both Coreg and lisinopril-but pressure currently controlled without antihypertensives.  History of extensive superficial venous thrombosis: Maintained on coumadin at home-continue per pharmacy  History of metastatic breast cancer: Recent PET scan on 08/31/15 showed numerous osseous mets-followed by Dr [name] (on vacation currently). Recent MRI entire spine on 4/28 showed multiple osseous spinal mets with no cord compression. Oncology ahd planned to get a Porta cath on 5/18 and to start cyclophosphamide, methotrexate, and fluorouracil on 10/02/2015.Obviously with fever/PNA-plans for chemo and port placement will need to be postponed. She does exhibit failure to thrive synd, hopefully with IV antibiotics and other supportive measures she will improve, otherwise may need to involve palliative care at some point. Will d/w Dr [name] early next week.  Anorexia: Likely secondary to  underlying malignancy worsened by pneumonia. Continue IV Abx, if no improvement, will start Marinol  Protein-calorie malnutrition, severe: Continue supplements, await nutrition evaluation.  DVT Prophylaxis: Full dose anticoagulation Coumadin  Code Status: Full code   Family Communication: None at bedside  Disposition Plan: Remain inpatient-SNF on discharge-sometime next week  Antimicrobial agents: IV Vancomycin 5/18>>5/20 IV Cefepime 5/18>>  Procedures: Echo 5/19>>EF 50-55%  CONSULTS:  None  Time spent: 25 minutes-Greater than 50% of this time was spent in counseling, explanation of diagnosis, planning of further management, and coordination of care.  MEDICATIONS: Anti-infectives    Start     Dose/Rate Route Frequency  Ordered Stop   09/15/15 0800  vancomycin (VANCOCIN) 500 mg in sodium chloride 0.9 % 100 mL IVPB     500 mg 100 mL/hr over 60 Minutes Intravenous Every 12 hours 09/14/15 1838 09/22/15 1959   09/15/15 0600  ceFEPIme (MAXIPIME) 1 g in dextrose 5 % 50 mL IVPB     1 g 100 mL/hr over 30 Minutes Intravenous Every 12 hours 09/14/15 1819 09/22/15 1759   09/14/15 2000  vancomycin (VANCOCIN) 1,250 mg in sodium chloride 0.9 % 250 mL IVPB     1,250 mg 166.7 mL/hr over 90 Minutes Intravenous  Once 09/14/15 1836 09/15/15 0026   09/14/15 1845  ceFEPIme (MAXIPIME) 2 g in dextrose 5 % 50 mL IVPB     2 g 100 mL/hr over 30 Minutes Intravenous  Once 09/14/15 1831 09/14/15 1918      Scheduled Meds: . ceFEPime (MAXIPIME) IV  1 g Intravenous Q12H  . dexamethasone  4 mg Intravenous Q12H  . feeding supplement  1 Container Oral BID BM  . fentaNYL  25 mcg Transdermal Q72H  . lidocaine  1 patch Transdermal Q24H  . vancomycin  500 mg Intravenous Q12H  . warfarin  2 mg Oral ONCE-1800  . Warfarin - Pharmacist Dosing Inpatient   Does not apply q1800   Continuous Infusions:  PRN Meds:.HYDROmorphone (DILAUDID) injection, magic mouthwash w/lidocaine, morphine, polyvinyl alcohol   PHYSICAL EXAM: Vital signs: Filed Vitals:   09/15/15 2127 09/16/15 0601 09/16/15 1357 09/16/15 1405  BP: 101/55 128/83 126/90 102/57  Pulse: 99 99 92 93  Temp: 98.9 F (37.2 C) 99 F (37.2 C) 97.9 F (36.6 C) 97.9 F (36.6 C)  TempSrc: Oral Oral Oral Oral  Resp: 20 20 16 20   Height:      Weight:      SpO2: 91% 96% 92% 99%   Filed Weights   09/14/15 1730  Weight: 59.648 kg (131 lb 8 oz)   Body mass index is 24.05 kg/(m^2).   Gen Exam: Awake and alert with clear speech.Chronically sick looking. Neck: Supple, No JVD.   Chest: B/L Clear.   CVS: S1 S2 Regular, no murmurs.  Abdomen: soft, BS +, non tender, non distended.  Extremities: no edema, lower extremities warm to touch. Neurologic: Non Focal-but exam limited due to  pain. Skin: No Rash or lesions   Wounds: N/A.    LABORATORY DATA: CBC:  Recent Labs Lab 09/15/15 0521  WBC 5.9  HGB 8.7*  HCT 26.7*  MCV 102.7*  PLT 112*    Basic Metabolic Panel:  Recent Labs Lab 09/15/15 0521 09/16/15 0532  NA 141 138  K 3.3* 3.1*  CL 109 103  CO2 24 27  GLUCOSE 75 101*  BUN 25* 18  CREATININE 0.76 0.62  CALCIUM 7.8* 8.6*    GFR: Estimated Creatinine Clearance: 51 mL/min (by C-G formula based on Cr of 0.62).  Liver Function Tests:  Recent Labs Lab 09/15/15 0521 09/16/15 0532  AST 70* 38  ALT 69* 54  ALKPHOS 153* 142*  BILITOT 1.4* 0.8  PROT 5.3* 4.9*  ALBUMIN 2.1* 1.9*   No results for input(s): LIPASE, AMYLASE in the last 168 hours. No results for input(s): AMMONIA in the last 168 hours.  Coagulation Profile:  Recent Labs Lab 09/14/15 1904 09/15/15 0521 09/16/15 0532  INR 1.90* 2.54* 1.95*    Cardiac Enzymes: No results for input(s): CKTOTAL, CKMB, CKMBINDEX, TROPONINI in the last 168 hours.  BNP (last 3 results) No results for input(s): PROBNP in the last 8760 hours.  HbA1C: No results  for input(s): HGBA1C in the last 72 hours.  CBG: No results for input(s): GLUCAP in the last 168 hours.  Lipid Profile: No results for input(s): CHOL, HDL, LDLCALC, TRIG, CHOLHDL, LDLDIRECT in the last 72 hours.  Thyroid Function Tests: No results for input(s): TSH, T4TOTAL, FREET4, T3FREE, THYROIDAB in the last 72 hours.  Anemia Panel: No results for input(s): VITAMINB12, FOLATE, FERRITIN, TIBC, IRON, RETICCTPCT in the last 72 hours.  Urine analysis:    Component Value Date/Time   COLORURINE YELLOW 09/14/2015 1834   APPEARANCEUR CLOUDY* 09/14/2015 1834   LABSPEC 1.013 09/14/2015 1834   PHURINE 5.5 09/14/2015 1834   GLUCOSEU NEGATIVE 09/14/2015 1834   HGBUR LARGE* 09/14/2015 1834   BILIRUBINUR NEGATIVE 09/14/2015 1834   KETONESUR NEGATIVE 09/14/2015 1834   PROTEINUR 30* 09/14/2015 1834   UROBILINOGEN 0.2 01/23/2015 1830     NITRITE POSITIVE* 09/14/2015 1834   LEUKOCYTESUR NEGATIVE 09/14/2015 1834    Sepsis Labs: Lactic Acid, Venous No results found for: LATICACIDVEN  MICROBIOLOGY: Recent Results (from the past 240 hour(s))  Culture, blood (routine x 2)     Status: None (Preliminary result)   Collection Time: 09/14/15  6:35 PM  Result Value Ref Range Status   Specimen Description BLOOD RIGHT ARM  Final   Special Requests BOTTLES DRAWN AEROBIC AND ANAEROBIC 6CC  Final   Culture   Final    NO GROWTH 2 DAYS Performed at [facility]    Report Status PENDING  Incomplete  Culture, blood (routine x 2)     Status: None (Preliminary result)   Collection Time: 09/14/15  6:40 PM  Result Value Ref Range Status   Specimen Description BLOOD LEFT HAND  Final   Special Requests BOTTLES DRAWN AEROBIC AND ANAEROBIC 5CC  Final   Culture   Final    NO GROWTH 2 DAYS Performed at [facility]    Report Status PENDING  Incomplete    RADIOLOGY STUDIES/RESULTS: Mr Kizzie Fantasia Contrast  08/25/2015  CLINICAL DATA:  Ongoing headache. Arm pain and pain beneath the sternum and ribs for 2 weeks. Right leg pain. Metastatic breast cancer. EXAM: MRI HEAD WITHOUT AND WITH CONTRAST; MRI CERVICAL SPINE WITHOUT AND WITH CONTRAST; MRI LUMBAR SPINE WITHOUT AND WITH CONTRAST; MRI THORACIC SPINE WITHOUT AND WITH CONTRAST TECHNIQUE: Multiplanar, multiecho pulse sequences of the brain and surrounding structures were obtained according to standard protocol without and with intravenous contrast; Multiplanar and multiecho pulse sequences of the cervical spine, to include the craniocervical junction and cervicothoracic junction, were obtained according to standard protocol without and with intravenous contrast.; Multiplanar and multiecho pulse sequences of the lumbar spine were obtained without and with intravenous contrast.; Multiplanar and multiecho pulse sequences of the thoracic spine were obtained without and with intravenous  contrast. CONTRAST:  60mL MULTIHANCE GADOBENATE DIMEGLUMINE 529 MG/ML IV SOLN COMPARISON:  Head CT 08/05/2015 and MRI 02/14/2009. Cervical spine MRI 05/14/2013. Thoracic and lumbar spine MRI 07/05/2015. FINDINGS: MR HEAD FINDINGS: There is no evidence of acute infarct, intracranial hemorrhage, mass, midline shift, or extra-axial fluid collection. There is mild cerebral atrophy. Small foci of T2 hyperintensity in the cerebral white matter bilaterally, stable to slightly increased from 2010 and nonspecific but compatible with mild-to-moderate chronic small vessel ischemic disease. No enhancing brain lesions are identified. There is at most minimally prominent dural enhancement without nodularity. Small enhancing lesions are present throughout the skull, progressive from 2010 (for example 3 cm left parietal lesion on image 28 of series 41). There is involvement  of the clivus. Prior bilateral cataract extraction is noted. There is a trace left mastoid effusion. No significant paranasal sinus inflammatory disease. Major intracranial vascular flow voids are preserved. MR CERVICAL SPINE FINDINGS: Cervical spine straightening is unchanged. There is no significant listhesis. There is progressive osseous metastatic disease throughout the cervical spine since the 2015 MRI. There is now complete marrow replacement involving the C4 vertebral body with patchy enhancement including a 1 cm focus posteriorly. There is also a left-sided posterior element involvement at C4 which is new. No epidural tumor or spinal cord compression is identified. No cervical spinal cord signal abnormality is identified within limitations of motion artifact. Diffuse cervical disc degeneration is again seen. There is mild neural foraminal stenosis on the right at C3-4, on the left at C4-5, and likely bilaterally at C5-6 and C6-7. This is overall similar to the prior MRI. No significant spinal stenosis is seen. The paraspinal soft tissues are  unremarkable. MR THORACIC SPINE FINDINGS: Diffuse osseous metastatic disease is again seen throughout the vertebral bodies and posterior elements of the thoracic spine. There has overall been mild progression from the 07/05/2015 thoracic spine MRI. There is now near complete involvement of the T10 vertebral body. There is mildly progressive marrow replacement involving multiple mid thoracic vertebral bodies as well as T1 and T2. The T3 vertebral body remains completely involved. There is a mild T12 superior endplate compression fracture which demonstrates slightly progressive height loss compared to the prior study. There is slightly prominent ventral epidural enhancement in the mid thoracic spine from T5 -T8, most conspicuous and mildly nodular in a left paracentral location at the T8 superior endplate level (series 37, image 33). This may reflect very small volume epidural tumor, prominent epidural venous plexus, or a combination of both. This has likely not significantly changed from the prior thoracic spine MRI, although there is much more motion artifact on axial sequences on that study. The spinal cord is normal in caliber and signal. There is no spinal cord compression. Multilevel disc degeneration is again seen with disc bulging and small disc protrusions without significant spinal canal or neural foraminal stenosis. Small left adrenal nodule and mild left upper pole renal caliectasis or cysts are partially visualized, grossly similar to the 09/07/2014 PET-CT. MR LUMBAR SPINE FINDINGS: Vertebral alignment is unchanged, with trace anterolisthesis again seen of L3 on L4. Osseous lesions throughout the lumbar spine do not appear significantly changed from the prior lumbar spine MRI, with the largest lesion in the L1 vertebral body. S3 level sacral osseous metastatic disease is partially visualized and grossly similar to the prior study. 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 Lumbar vertebral body  heights are preserved. There is diffuse lumbar disc desiccation. Severe disc space height loss at L4-5 is unchanged. Multilevel disc and facet degeneration does not appear significantly changed, with mild right foraminal stenosis again seen at L3-4. There is no spinal stenosis. No epidural tumor is identified. Conus medullaris is normal in signal and terminates at L1. IMPRESSION: 1. No acute intracranial abnormality. No evidence of brain metastases. 2. Numerous osseous skull metastases, progressed from 2010. 3. Widespread spinal osseous metastatic disease, with interval progression in the cervical and thoracic spine. No sizable epidural tumor identified. Slightly prominent ventral epidural enhancement in the mid thoracic spine is favored to mostly reflect epidural venous plexus, however trace epidural tumor is possible particularly at T8. 4. No spinal stenosis. 5. Mild T12 superior endplate compression fracture with slightly progressive height loss from 07/05/2015. Electronically  Signed   By: Logan Bores M.D.   On: 08/25/2015 17:08   Mr Cervical Spine W Wo Contrast  08/25/2015  CLINICAL DATA:  Ongoing headache. Arm pain and pain beneath the sternum and ribs for 2 weeks. Right leg pain. Metastatic breast cancer. EXAM: MRI HEAD WITHOUT AND WITH CONTRAST; MRI CERVICAL SPINE WITHOUT AND WITH CONTRAST; MRI LUMBAR SPINE WITHOUT AND WITH CONTRAST; MRI THORACIC SPINE WITHOUT AND WITH CONTRAST TECHNIQUE: Multiplanar, multiecho pulse sequences of the brain and surrounding structures were obtained according to standard protocol without and with intravenous contrast; Multiplanar and multiecho pulse sequences of the cervical spine, to include the craniocervical junction and cervicothoracic junction, were obtained according to standard protocol without and with intravenous contrast.; Multiplanar and multiecho pulse sequences of the lumbar spine were obtained without and with intravenous contrast.; Multiplanar and multiecho  pulse sequences of the thoracic spine were obtained without and with intravenous contrast. CONTRAST:  79mL MULTIHANCE GADOBENATE DIMEGLUMINE 529 MG/ML IV SOLN COMPARISON:  Head CT 08/05/2015 and MRI 02/14/2009. Cervical spine MRI 05/14/2013. Thoracic and lumbar spine MRI 07/05/2015. FINDINGS: MR HEAD FINDINGS: There is no evidence of acute infarct, intracranial hemorrhage, mass, midline shift, or extra-axial fluid collection. There is mild cerebral atrophy. Small foci of T2 hyperintensity in the cerebral white matter bilaterally, stable to slightly increased from 2010 and nonspecific but compatible with mild-to-moderate chronic small vessel ischemic disease. No enhancing brain lesions are identified. There is at most minimally prominent dural enhancement without nodularity. Small enhancing lesions are present throughout the skull, progressive from 2010 (for example 3 cm left parietal lesion on image 28 of series 41). There is involvement of the clivus. Prior bilateral cataract extraction is noted. There is a trace left mastoid effusion. No significant paranasal sinus inflammatory disease. Major intracranial vascular flow voids are preserved. MR CERVICAL SPINE FINDINGS: Cervical spine straightening is unchanged. There is no significant listhesis. There is progressive osseous metastatic disease throughout the cervical spine since the 2015 MRI. There is now complete marrow replacement involving the C4 vertebral body with patchy enhancement including a 1 cm focus posteriorly. There is also a left-sided posterior element involvement at C4 which is new. No epidural tumor or spinal cord compression is identified. No cervical spinal cord signal abnormality is identified within limitations of motion artifact. Diffuse cervical disc degeneration is again seen. There is mild neural foraminal stenosis on the right at C3-4, on the left at C4-5, and likely bilaterally at C5-6 and C6-7. This is overall similar to the prior MRI. No  significant spinal stenosis is seen. The paraspinal soft tissues are unremarkable. MR THORACIC SPINE FINDINGS: Diffuse osseous metastatic disease is again seen throughout the vertebral bodies and posterior elements of the thoracic spine. There has overall been mild progression from the 07/05/2015 thoracic spine MRI. There is now near complete involvement of the T10 vertebral body. There is mildly progressive marrow replacement involving multiple mid thoracic vertebral bodies as well as T1 and T2. The T3 vertebral body remains completely involved. There is a mild T12 superior endplate compression fracture which demonstrates slightly progressive height loss compared to the prior study. There is slightly prominent ventral epidural enhancement in the mid thoracic spine from T5 -T8, most conspicuous and mildly nodular in a left paracentral location at the T8 superior endplate level (series 37, image 33). This may reflect very small volume epidural tumor, prominent epidural venous plexus, or a combination of both. This has likely not significantly changed from the prior thoracic spine MRI, although  there is much more motion artifact on axial sequences on that study. The spinal cord is normal in caliber and signal. There is no spinal cord compression. Multilevel disc degeneration is again seen with disc bulging and small disc protrusions without significant spinal canal or neural foraminal stenosis. Small left adrenal nodule and mild left upper pole renal caliectasis or cysts are partially visualized, grossly similar to the 09/07/2014 PET-CT. MR LUMBAR SPINE FINDINGS: Vertebral alignment is unchanged, with trace anterolisthesis again seen of L3 on L4. Osseous lesions throughout the lumbar spine do not appear significantly changed from the prior lumbar spine MRI, with the largest lesion in the L1 vertebral body. S3 level sacral osseous metastatic disease is partially visualized and grossly similar to the prior study. Iliac  bone metastases are also partially visualized. Lumbar vertebral body heights are preserved. There is diffuse lumbar disc desiccation. Severe disc space height loss at L4-5 is unchanged. Multilevel disc and facet degeneration does not appear significantly changed, with mild right foraminal stenosis again seen at L3-4. There is no spinal stenosis. No epidural tumor is identified. Conus medullaris is normal in signal and terminates at L1. IMPRESSION: 1. No acute intracranial abnormality. No evidence of brain metastases. 2. Numerous osseous skull metastases, progressed from 2010. 3. Widespread spinal osseous metastatic disease, with interval progression in the cervical and thoracic spine. No sizable epidural tumor identified. Slightly prominent ventral epidural enhancement in the mid thoracic spine is favored to mostly reflect epidural venous plexus, however trace epidural tumor is possible particularly at T8. 4. No spinal stenosis. 5. Mild T12 superior endplate compression fracture with slightly progressive height loss from 07/05/2015. Electronically Signed   By: Logan Bores M.D.   On: 08/25/2015 17:08   Mr Thoracic Spine W Wo Contrast  08/25/2015  CLINICAL DATA:  Ongoing headache. Arm pain and pain beneath the sternum and ribs for 2 weeks. Right leg pain. Metastatic breast cancer. EXAM: MRI HEAD WITHOUT AND WITH CONTRAST; MRI CERVICAL SPINE WITHOUT AND WITH CONTRAST; MRI LUMBAR SPINE WITHOUT AND WITH CONTRAST; MRI THORACIC SPINE WITHOUT AND WITH CONTRAST TECHNIQUE: Multiplanar, multiecho pulse sequences of the brain and surrounding structures were obtained according to standard protocol without and with intravenous contrast; Multiplanar and multiecho pulse sequences of the cervical spine, to include the craniocervical junction and cervicothoracic junction, were obtained according to standard protocol without and with intravenous contrast.; Multiplanar and multiecho pulse sequences of the lumbar spine were obtained  without and with intravenous contrast.; Multiplanar and multiecho pulse sequences of the thoracic spine were obtained without and with intravenous contrast. CONTRAST:  46mL MULTIHANCE GADOBENATE DIMEGLUMINE 529 MG/ML IV SOLN COMPARISON:  Head CT 08/05/2015 and MRI 02/14/2009. Cervical spine MRI 05/14/2013. Thoracic and lumbar spine MRI 07/05/2015. FINDINGS: MR HEAD FINDINGS: There is no evidence of acute infarct, intracranial hemorrhage, mass, midline shift, or extra-axial fluid collection. There is mild cerebral atrophy. Small foci of T2 hyperintensity in the cerebral white matter bilaterally, stable to slightly increased from 2010 and nonspecific but compatible with mild-to-moderate chronic small vessel ischemic disease. No enhancing brain lesions are identified. There is at most minimally prominent dural enhancement without nodularity. Small enhancing lesions are present throughout the skull, progressive from 2010 (for example 3 cm left parietal lesion on image 28 of series 41). There is involvement of the clivus. Prior bilateral cataract extraction is noted. There is a trace left mastoid effusion. No significant paranasal sinus inflammatory disease. Major intracranial vascular flow voids are preserved. MR CERVICAL SPINE FINDINGS: Cervical spine straightening  is unchanged. There is no significant listhesis. There is progressive osseous metastatic disease throughout the cervical spine since the 2015 MRI. There is now complete marrow replacement involving the C4 vertebral body with patchy enhancement including a 1 cm focus posteriorly. There is also a left-sided posterior element involvement at C4 which is new. No epidural tumor or spinal cord compression is identified. No cervical spinal cord signal abnormality is identified within limitations of motion artifact. Diffuse cervical disc degeneration is again seen. There is mild neural foraminal stenosis on the right at C3-4, on the left at C4-5, and likely  bilaterally at C5-6 and C6-7. This is overall similar to the prior MRI. No significant spinal stenosis is seen. The paraspinal soft tissues are unremarkable. MR THORACIC SPINE FINDINGS: Diffuse osseous metastatic disease is again seen throughout the vertebral bodies and posterior elements of the thoracic spine. There has overall been mild progression from the 07/05/2015 thoracic spine MRI. There is now near complete involvement of the T10 vertebral body. There is mildly progressive marrow replacement involving multiple mid thoracic vertebral bodies as well as T1 and T2. The T3 vertebral body remains completely involved. There is a mild T12 superior endplate compression fracture which demonstrates slightly progressive height loss compared to the prior study. There is slightly prominent ventral epidural enhancement in the mid thoracic spine from T5 -T8, most conspicuous and mildly nodular in a left paracentral location at the T8 superior endplate level (series 37, image 33). This may reflect very small volume epidural tumor, prominent epidural venous plexus, or a combination of both. This has likely not significantly changed from the prior thoracic spine MRI, although there is much more motion artifact on axial sequences on that study. The spinal cord is normal in caliber and signal. There is no spinal cord compression. Multilevel disc degeneration is again seen with disc bulging and small disc protrusions without significant spinal canal or neural foraminal stenosis. Small left adrenal nodule and mild left upper pole renal caliectasis or cysts are partially visualized, grossly similar to the 09/07/2014 PET-CT. MR LUMBAR SPINE FINDINGS: Vertebral alignment is unchanged, with trace anterolisthesis again seen of L3 on L4. Osseous lesions throughout the lumbar spine do not appear significantly changed from the prior lumbar spine MRI, with the largest lesion in the L1 vertebral body. S3 level sacral osseous metastatic  disease is partially visualized and grossly similar to the prior study. 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 Lumbar vertebral body heights are preserved. There is diffuse lumbar disc desiccation. Severe disc space height loss at L4-5 is unchanged. Multilevel disc and facet degeneration does not appear significantly changed, with mild right foraminal stenosis again seen at L3-4. There is no spinal stenosis. No epidural tumor is identified. Conus medullaris is normal in signal and terminates at L1. IMPRESSION: 1. No acute intracranial abnormality. No evidence of brain metastases. 2. Numerous osseous skull metastases, progressed from 2010. 3. Widespread spinal osseous metastatic disease, with interval progression in the cervical and thoracic spine. No sizable epidural tumor identified. Slightly prominent ventral epidural enhancement in the mid thoracic spine is favored to mostly reflect epidural venous plexus, however trace epidural tumor is possible particularly at T8. 4. No spinal stenosis. 5. Mild T12 superior endplate compression fracture with slightly progressive height loss from 07/05/2015. Electronically Signed   By: Logan Bores M.D.   On: 08/25/2015 17:08   Mr Lumbar Spine W Wo Contrast  08/25/2015  CLINICAL DATA:  Ongoing headache. Arm pain and pain beneath the sternum  and ribs for 2 weeks. Right leg pain. Metastatic breast cancer. EXAM: MRI HEAD WITHOUT AND WITH CONTRAST; MRI CERVICAL SPINE WITHOUT AND WITH CONTRAST; MRI LUMBAR SPINE WITHOUT AND WITH CONTRAST; MRI THORACIC SPINE WITHOUT AND WITH CONTRAST TECHNIQUE: Multiplanar, multiecho pulse sequences of the brain and surrounding structures were obtained according to standard protocol without and with intravenous contrast; Multiplanar and multiecho pulse sequences of the cervical spine, to include the craniocervical junction and cervicothoracic junction, were obtained according to standard protocol without and with intravenous contrast.;  Multiplanar and multiecho pulse sequences of the lumbar spine were obtained without and with intravenous contrast.; Multiplanar and multiecho pulse sequences of the thoracic spine were obtained without and with intravenous contrast. CONTRAST:  80mL MULTIHANCE GADOBENATE DIMEGLUMINE 529 MG/ML IV SOLN COMPARISON:  Head CT 08/05/2015 and MRI 02/14/2009. Cervical spine MRI 05/14/2013. Thoracic and lumbar spine MRI 07/05/2015. FINDINGS: MR HEAD FINDINGS: There is no evidence of acute infarct, intracranial hemorrhage, mass, midline shift, or extra-axial fluid collection. There is mild cerebral atrophy. Small foci of T2 hyperintensity in the cerebral white matter bilaterally, stable to slightly increased from 2010 and nonspecific but compatible with mild-to-moderate chronic small vessel ischemic disease. No enhancing brain lesions are identified. There is at most minimally prominent dural enhancement without nodularity. Small enhancing lesions are present throughout the skull, progressive from 2010 (for example 3 cm left parietal lesion on image 28 of series 41). There is involvement of the clivus. Prior bilateral cataract extraction is noted. There is a trace left mastoid effusion. No significant paranasal sinus inflammatory disease. Major intracranial vascular flow voids are preserved. MR CERVICAL SPINE FINDINGS: Cervical spine straightening is unchanged. There is no significant listhesis. There is progressive osseous metastatic disease throughout the cervical spine since the 2015 MRI. There is now complete marrow replacement involving the C4 vertebral body with patchy enhancement including a 1 cm focus posteriorly. There is also a left-sided posterior element involvement at C4 which is new. No epidural tumor or spinal cord compression is identified. No cervical spinal cord signal abnormality is identified within limitations of motion artifact. Diffuse cervical disc degeneration is again seen. There is mild neural  foraminal stenosis on the right at C3-4, on the left at C4-5, and likely bilaterally at C5-6 and C6-7. This is overall similar to the prior MRI. No significant spinal stenosis is seen. The paraspinal soft tissues are unremarkable. MR THORACIC SPINE FINDINGS: Diffuse osseous metastatic disease is again seen throughout the vertebral bodies and posterior elements of the thoracic spine. There has overall been mild progression from the 07/05/2015 thoracic spine MRI. There is now near complete involvement of the T10 vertebral body. There is mildly progressive marrow replacement involving multiple mid thoracic vertebral bodies as well as T1 and T2. The T3 vertebral body remains completely involved. There is a mild T12 superior endplate compression fracture which demonstrates slightly progressive height loss compared to the prior study. There is slightly prominent ventral epidural enhancement in the mid thoracic spine from T5 -T8, most conspicuous and mildly nodular in a left paracentral location at the T8 superior endplate level (series 37, image 33). This may reflect very small volume epidural tumor, prominent epidural venous plexus, or a combination of both. This has likely not significantly changed from the prior thoracic spine MRI, although there is much more motion artifact on axial sequences on that study. The spinal cord is normal in caliber and signal. There is no spinal cord compression. Multilevel disc degeneration is again seen with disc bulging  and small disc protrusions without significant spinal canal or neural foraminal stenosis. Small left adrenal nodule and mild left upper pole renal caliectasis or cysts are partially visualized, grossly similar to the 09/07/2014 PET-CT. MR LUMBAR SPINE FINDINGS: Vertebral alignment is unchanged, with trace anterolisthesis again seen of L3 on L4. Osseous lesions throughout the lumbar spine do not appear significantly changed from the prior lumbar spine MRI, with the  largest lesion in the L1 vertebral body. S3 level sacral osseous metastatic disease is partially visualized and grossly similar to the prior study. 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 Lumbar vertebral body heights are preserved. There is diffuse lumbar disc desiccation. Severe disc space height loss at L4-5 is unchanged. Multilevel disc and facet degeneration does not appear significantly changed, with mild right foraminal stenosis again seen at L3-4. There is no spinal stenosis. No epidural tumor is identified. Conus medullaris is normal in signal and terminates at L1. IMPRESSION: 1. No acute intracranial abnormality. No evidence of brain metastases. 2. Numerous osseous skull metastases, progressed from 2010. 3. Widespread spinal osseous metastatic disease, with interval progression in the cervical and thoracic spine. No sizable epidural tumor identified. Slightly prominent ventral epidural enhancement in the mid thoracic spine is favored to mostly reflect epidural venous plexus, however trace epidural tumor is possible particularly at T8. 4. No spinal stenosis. 5. Mild T12 superior endplate compression fracture with slightly progressive height loss from 07/05/2015. Electronically Signed   By: Logan Bores M.D.   On: 08/25/2015 17:08   Nm Pet Image Restag (ps) Skull Base To Thigh  08/31/2015  CLINICAL DATA:  Subsequent treatment strategy for metastatic breast carcinoma to bone. EXAM: NUCLEAR MEDICINE PET SKULL BASE TO THIGH TECHNIQUE: 6.3 mCi F-18 FDG was injected intravenously. Full-ring PET imaging was performed from the skull base to thigh after the radiotracer. CT data was obtained and used for attenuation correction and anatomic localization. FASTING BLOOD GLUCOSE:  Value: 103 mg/dl COMPARISON:  09/07/2014 FINDINGS: NECK No hypermetabolic lymph nodes in the neck. CHEST No hypermetabolic mediastinal or hilar nodes. No suspicious pulmonary nodules on the CT scan. Biapical pleural- parenchymal  scarring appears stable. Previously seen area of hypermetabolic airspace disease in the left upper lobe has nearly completely resolved, consistent with resolving infectious or inflammatory process. ABDOMEN/PELVIS No abnormal hypermetabolic activity within the liver, pancreas, adrenal glands, or spleen. No hypermetabolic lymph nodes in the abdomen or pelvis. Tiny left hepatic lobe cyst remains stable. Prior cholecystectomy noted. 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 11 mm left adrenal nodule remains stable and shows no metabolic activity, consistent with benign adrenal adenoma. Colonic diverticulosis noted, without evidence of diverticulitis. SKELETON Mixed lytic and sclerotic bone metastases again seen within spine, pelvis, sternum, and proximal left femur. These appear stable and show no significant change in low-grade metabolic activity, consistent with largely treated osseous metastatic disease. IMPRESSION: Near complete resolution of hypermetabolic airspace disease in left upper lobe, consistent with resolving infectious or inflammatory process. No significant change in appearance of largely treated osseous metastatic disease. No new or progressive metastatic disease identified. Electronically Signed   By: Earle Gell M.D.   On: 08/31/2015 12:48   Dg Chest Port 1 View  09/15/2015  CLINICAL DATA:  Short breath, cough, fever EXAM: PORTABLE CHEST 1 VIEW COMPARISON:  08/31/2015 FINDINGS: Normal cardiac silhouette with ectatic aorta. Increased density in the LEFT upper lobe. The biapical densities are not changed from CT. Small LEFT effusion. No pneumothorax. IMPRESSION: 1. Increasing density in the LEFT upper lobe.  Concerning for edema or infection. Electronically Signed   By: Suzy Bouchard M.D.   On: 09/15/2015 07:26   US Abdomen Limited Ruq  09/15/2015  CLINICAL DATA:  Elevated liver function tests. EXAM: US ABDOMEN LIMITED - RIGHT UPPER QUADRANT COMPARISON:  08/31/2015, PET-CT. FINDINGS: Gallbladder:  Surgically absent Common bile duct: Diameter: Dilated to a maximum of 17 mm, unchanged from the recent prior PET-CT. No sonographic evidence of a duct stone. Liver: Cyst in the left liver lobe measuring 13 somewhat mottled appearance of the liver in its more posterior aspect, which could be technique. The possibility of numerous small infiltrating lesion should be considered in the patient's clinical setting. Liver is normal in size and overall echogenicity. IMPRESSION: 1. Chronically dilated common bile duct. Status post cholecystectomy. 2. Mottled appearance of the posterior liver. Consider followup liver MRI with and without contrast. Electronically Signed   By: Lajean Manes M.D.   On: 09/15/2015 10:04     LOS: 2 days   Oren Binet, MD  [facility] Pager:[phone]  If 7PM-7AM, please contact night-coverage [email or web address] Password [facility] 09/16/2015, 2:31 PM

## 2015-09-17 DIAGNOSIS — R945 Abnormal results of liver function studies: Secondary | ICD-10-CM

## 2015-09-17 DIAGNOSIS — Z515 Encounter for palliative care: Secondary | ICD-10-CM | POA: Insufficient documentation

## 2015-09-17 DIAGNOSIS — R7989 Other specified abnormal findings of blood chemistry: Secondary | ICD-10-CM | POA: Insufficient documentation

## 2015-09-17 DIAGNOSIS — R1084 Generalized abdominal pain: Secondary | ICD-10-CM | POA: Insufficient documentation

## 2015-09-17 DIAGNOSIS — R6510 Systemic inflammatory response syndrome (SIRS) of non-infectious origin without acute organ dysfunction: Secondary | ICD-10-CM

## 2015-09-17 LAB — BASIC METABOLIC PANEL
Anion gap: 11 (ref 5–15)
BUN: 26 mg/dL — ABNORMAL HIGH (ref 6–20)
CHLORIDE: 100 mmol/L — AB (ref 101–111)
CO2: 28 mmol/L (ref 22–32)
Calcium: 9 mg/dL (ref 8.9–10.3)
Creatinine, Ser: 0.64 mg/dL (ref 0.44–1.00)
GFR calc Af Amer: >60 mL/min (ref >60)
GFR calc non Af Amer: >60 mL/min (ref >60)
GLUCOSE: 149 mg/dL — AB (ref 65–99)
POTASSIUM: 3.8 mmol/L (ref 3.5–5.1)
Sodium: 139 mmol/L (ref 135–145)

## 2015-09-17 LAB — CBC
HEMATOCRIT: 27.6 % — AB (ref 36.0–46.0)
HEMOGLOBIN: 9.1 g/dL — AB (ref 12.0–15.0)
MCH: 34 pg (ref 26.0–34.0)
MCHC: 33 g/dL (ref 30.0–36.0)
MCV: 103 fL — AB (ref 78.0–100.0)
Platelets: 148 10*3/uL — ABNORMAL LOW (ref 150–400)
RBC: 2.68 MIL/uL — AB (ref 3.87–5.11)
RDW: 16.8 % — ABNORMAL HIGH (ref 11.5–15.5)
WBC: 7.4 10*3/uL (ref 4.0–10.5)

## 2015-09-17 LAB — PROTIME-INR
INR: 1.66 — ABNORMAL HIGH (ref 0.00–1.49)
Prothrombin Time: 19 seconds — ABNORMAL HIGH (ref 11.6–15.2)

## 2015-09-17 MED ORDER — PANTOPRAZOLE SODIUM 40 MG PO TBEC
40.0000 mg | DELAYED_RELEASE_TABLET | Freq: Every day | ORAL | Status: DC
  Administered 2015-09-17 – 2015-09-19 (×3): 40 mg via ORAL
  Filled 2015-09-17 (×6): qty 1

## 2015-09-17 MED ORDER — VITAMINS A & D EX OINT
TOPICAL_OINTMENT | CUTANEOUS | Status: AC
  Administered 2015-09-17: 18:00:00
  Filled 2015-09-17: qty 5

## 2015-09-17 MED ORDER — SENNOSIDES-DOCUSATE SODIUM 8.6-50 MG PO TABS: 2.0000 | ORAL_TABLET | Freq: Every evening | ORAL | Status: DC | PRN

## 2015-09-17 MED ORDER — ONDANSETRON HCL 4 MG/2ML IJ SOLN: 4.0000 mg | Freq: Four times a day (QID) | INTRAMUSCULAR | Status: DC | PRN

## 2015-09-17 MED ORDER — WARFARIN SODIUM 4 MG PO TABS
4.0000 mg | ORAL_TABLET | Freq: Once | ORAL | Status: AC
  Administered 2015-09-17: 4 mg via ORAL
  Filled 2015-09-17: qty 1

## 2015-09-17 MED ORDER — GI COCKTAIL ~~LOC~~
30.0000 mL | Freq: Three times a day (TID) | ORAL | Status: DC | PRN
  Administered 2015-09-17: 30 mL via ORAL
  Filled 2015-09-17: qty 30

## 2015-09-17 NOTE — Progress Notes (Signed)
T/C to [facility] in [place] to follow up on pt's blood culture results from her visit there on 09/14/15. As per [facility] in the Lab, they have not received any preliminary results as of today.   Lind Guest, RN

## 2015-09-17 NOTE — Progress Notes (Addendum)
PROGRESS NOTE        PATIENT DETAILS Name: [name] Age: 70 y.o. Sex: female Date of Birth: 1945-05-09 Admit Date: 09/14/2015 Admitting Physician Evalee Mutton Kristeen Mans, MD [id number] E, MD  Brief Narrative: Patient is a 70 y.o. female with history of metastatic breast cancer, admitted with healthcare associated pneumonia, generalized weakness and generalized bony pain.  Subjective: Has indigestion and epigastric/lower chest pain this morning. Her "bone pains" have improved. She has been afebrile for more than 2 days now.   Assessment/Plan: Principal Problem: SIR's secondary to HCAP (healthcare-associated pneumonia): Although clinically improved-now afebrile for >2 days.Main issues continue to be generalized weakness and diffuse pain. Blood cultures done here and at [facility] (per RN who called) are negative, as a result Vancomycin was discontinued on 5/21 and is now just on Cefepime. Urine streptococcal and legionella antigen negative. Continue to follow clinical course, will need to be mobilized by physical therapy.  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 Unfortunately, her deconditioning has only worsened due to acute illness and pneumonia. PT evaluation completed, recommendations are for SNF.  Generalized/Diffuse Pain: suspect secondary to multiple osseous mets, although still persistent she is seems more comfortable today with Decadron, Fentanyl transdermal and prn IV Dilaudid and prn MSIR.May need to be started on bisphosphonates if pain is still an issue in the next few days. Appreciate palliative care evaluation. We will discuss with patient's primary oncologist on 5/22 as well.  Mildly elevated LFTs: Now normalized-likely due to acute  infection, but given history of malignancy-liver metastases is a cause of concern. RUQ ultrasound no obvious metastatic lesions. Recent PET scan on 08/31/15 did not show any major hepatic lesions as well. For now continue to follow.  Epigastric/lower chest pain: describes it as "indigestion"-start PPI, and prn GI cocktail. EKG-no acute/new changes. Suspect no further work up required at this point-except following clinical course  Hypokalemia:repleted   Anemia: Likely secondary to chronic disease-worsened by acute illness, no evidence of blood loss. Follow CBC  HTN: Continue to hold both Coreg and lisinopril-but pressure currently controlled without antihypertensives.  History of extensive superficial venous thrombosis: Maintained on coumadin at home-continue per pharmacy  History of metastatic breast cancer: Recent PET scan on 08/31/15 showed numerous osseous mets-followed by Dr [name] (on vacation currently). Recent MRI entire spine on 4/28 showed multiple osseous spinal mets with no cord compression. Oncology had planned to get a Porta cath on 5/18 and to start cyclophosphamide, methotrexate, and fluorouracil on 10/02/2015.Obviously with fever/PNA-plans for chemo and port placement will need to be postponed. She does exhibit failure to thrive synd, hopefully with IV antibiotics and other supportive measures she will improve, otherwise may need to involve palliative care at some point. Will d/w Dr [name] tomorrow  Anorexia: Likely secondary to  underlying malignancy worsened by pneumonia. Continue IV Abx, if no improvement, will start Marinol  Protein-calorie malnutrition, severe: Continue supplements, await nutrition evaluation.  DVT Prophylaxis: Full dose anticoagulation on Coumadin  Code Status: Full code   Family Communication: None at bedside  Disposition Plan: Remain inpatient-SNF on discharge-sometime in next 1-2 days  Antimicrobial agents: IV Vancomycin 5/18>>5/21 IV  Cefepime 5/18>>  Procedures: Echo 5/19>>EF 50-55%  CONSULTS:  None  Time spent: 25 minutes-Greater than 50% of this time was spent in counseling, explanation of diagnosis, planning of further management, and coordination of care.  MEDICATIONS: Anti-infectives    Start     Dose/Rate Route Frequency Ordered Stop   09/15/15 0800  vancomycin (VANCOCIN) 500 mg in sodium chloride 0.9 % 100 mL IVPB     500 mg 100 mL/hr over 60 Minutes Intravenous Every 12 hours 09/14/15 1838 09/22/15 1959   09/15/15 0600  ceFEPIme (MAXIPIME) 1 g in dextrose 5 % 50 mL IVPB     1 g 100 mL/hr over 30 Minutes Intravenous Every 12 hours 09/14/15 1819 09/22/15 1759   09/14/15 2000  vancomycin (VANCOCIN) 1,250 mg in sodium chloride 0.9 % 250 mL IVPB     1,250 mg 166.7 mL/hr over 90 Minutes Intravenous  Once 09/14/15 1836 09/15/15 0026   09/14/15 1845  ceFEPIme (MAXIPIME) 2 g in dextrose 5 % 50 mL IVPB     2 g 100 mL/hr over 30 Minutes Intravenous  Once 09/14/15 1831 09/14/15 1918      Scheduled Meds: . ceFEPime (MAXIPIME) IV  1 g Intravenous Q12H  . dexamethasone  4 mg Intravenous Q12H  . feeding supplement  1 Container Oral BID BM  . fentaNYL  25 mcg Transdermal Q72H  . lidocaine  1 patch Transdermal Q24H  . pantoprazole  40 mg Oral Daily  . vancomycin  500 mg Intravenous Q12H  . Warfarin - Pharmacist Dosing Inpatient   Does not apply q1800   Continuous Infusions:  PRN Meds:.gi cocktail, HYDROmorphone (DILAUDID) injection, magic mouthwash w/lidocaine, morphine, ondansetron (ZOFRAN) IV, polyvinyl alcohol, senna-docusate   PHYSICAL EXAM: Vital signs: Filed Vitals:   09/16/15 1405 09/16/15 2025 09/17/15 0543 09/17/15 0700  BP: 102/57 94/61  125/74  Pulse: 93 104 79   Temp: 97.9 F (36.6 C) 98.2 F (36.8 C) 97.7 F (36.5 C)   TempSrc: Oral Oral Oral   Resp: 20 20 20    Height:      Weight:      SpO2: 99% 97% 93%    Filed Weights   09/14/15 1730  Weight: 59.648 kg (131 lb 8 oz)   Body mass  index is 24.05 kg/(m^2).   Gen Exam: Awake and alert with clear speech.Chronically sick looking. Neck: Supple, No JVD.   Chest: B/L Clear.   CVS: S1 S2 Regular, no murmurs.  Abdomen: soft, BS +, mildly tender in epigastric area- non distended.  Extremities: no edema, lower extremities warm to touch. Neurologic: Non Focal-but exam limited due to pain. Skin: No Rash or lesions   Wounds: N/A.    LABORATORY DATA: CBC:  Recent Labs Lab 09/15/15 0521 09/17/15 0530  WBC 5.9 7.4  HGB 8.7* 9.1*  HCT 26.7* 27.6*  MCV 102.7* 103.0*  PLT 112* 148*    Basic Metabolic Panel:  Recent Labs Lab 09/15/15 0521 09/16/15 0532 09/17/15 0530  NA 141 138 139  K 3.3* 3.1* 3.8  CL 109 103 100*  CO2 24 27 28   GLUCOSE 75 101* 149*  BUN 25* 18 26*  CREATININE 0.76 0.62 0.64  CALCIUM 7.8* 8.6* 9.0    GFR: Estimated Creatinine Clearance: 51 mL/min (by C-G formula based on Cr of 0.64).  Liver Function Tests:  Recent Labs Lab 09/15/15 0521 09/16/15 0532  AST 70* 38  ALT 69* 54  ALKPHOS 153* 142*  BILITOT 1.4* 0.8  PROT 5.3* 4.9*  ALBUMIN 2.1* 1.9*   No results for input(s): LIPASE, AMYLASE in the  last 168 hours. No results for input(s): AMMONIA in the last 168 hours.  Coagulation Profile:  Recent Labs Lab 09/14/15 1904 09/15/15 0521 09/16/15 0532 09/17/15 0530  INR 1.90* 2.54* 1.95* 1.66*    Cardiac Enzymes: No results for input(s): CKTOTAL, CKMB, CKMBINDEX, TROPONINI in the last 168 hours.  BNP (last 3 results) No results for input(s): PROBNP in the last 8760 hours.  HbA1C: No results for input(s): HGBA1C in the last 72 hours.  CBG: No results for input(s): GLUCAP in the last 168 hours.  Lipid Profile: No results for input(s): CHOL, HDL, LDLCALC, TRIG, CHOLHDL, LDLDIRECT in the last 72 hours.  Thyroid Function Tests: No results for input(s): TSH, T4TOTAL, FREET4, T3FREE, THYROIDAB in the last 72 hours.  Anemia Panel: No results for input(s): VITAMINB12,  FOLATE, FERRITIN, TIBC, IRON, RETICCTPCT in the last 72 hours.  Urine analysis:    Component Value Date/Time   COLORURINE YELLOW 09/14/2015 1834   APPEARANCEUR CLOUDY* 09/14/2015 1834   LABSPEC 1.013 09/14/2015 1834   PHURINE 5.5 09/14/2015 1834   GLUCOSEU NEGATIVE 09/14/2015 1834   HGBUR LARGE* 09/14/2015 1834   BILIRUBINUR NEGATIVE 09/14/2015 1834   KETONESUR NEGATIVE 09/14/2015 1834   PROTEINUR 30* 09/14/2015 1834   UROBILINOGEN 0.2 01/23/2015 1830   NITRITE POSITIVE* 09/14/2015 1834   LEUKOCYTESUR NEGATIVE 09/14/2015 1834    Sepsis Labs: Lactic Acid, Venous No results found for: LATICACIDVEN  MICROBIOLOGY: Recent Results (from the past 240 hour(s))  Culture, blood (routine x 2)     Status: None (Preliminary result)   Collection Time: 09/14/15  6:35 PM  Result Value Ref Range Status   Specimen Description BLOOD RIGHT ARM  Final   Special Requests BOTTLES DRAWN AEROBIC AND ANAEROBIC 6CC  Final   Culture   Final    NO GROWTH 2 DAYS Performed at [facility]    Report Status PENDING  Incomplete  Culture, blood (routine x 2)     Status: None (Preliminary result)   Collection Time: 09/14/15  6:40 PM  Result Value Ref Range Status   Specimen Description BLOOD LEFT HAND  Final   Special Requests BOTTLES DRAWN AEROBIC AND ANAEROBIC 5CC  Final   Culture   Final    NO GROWTH 2 DAYS Performed at [facility]    Report Status PENDING  Incomplete    RADIOLOGY STUDIES/RESULTS: Mr Kizzie Fantasia Contrast  08/25/2015  CLINICAL DATA:  Ongoing headache. Arm pain and pain beneath the sternum and ribs for 2 weeks. Right leg pain. Metastatic breast cancer. EXAM: MRI HEAD WITHOUT AND WITH CONTRAST; MRI CERVICAL SPINE WITHOUT AND WITH CONTRAST; MRI LUMBAR SPINE WITHOUT AND WITH CONTRAST; MRI THORACIC SPINE WITHOUT AND WITH CONTRAST TECHNIQUE: Multiplanar, multiecho pulse sequences of the brain and surrounding structures were obtained according to standard protocol without and  with intravenous contrast; Multiplanar and multiecho pulse sequences of the cervical spine, to include the craniocervical junction and cervicothoracic junction, were obtained according to standard protocol without and with intravenous contrast.; Multiplanar and multiecho pulse sequences of the lumbar spine were obtained without and with intravenous contrast.; Multiplanar and multiecho pulse sequences of the thoracic spine were obtained without and with intravenous contrast. CONTRAST:  13mL MULTIHANCE GADOBENATE DIMEGLUMINE 529 MG/ML IV SOLN COMPARISON:  Head CT 08/05/2015 and MRI 02/14/2009. Cervical spine MRI 05/14/2013. Thoracic and lumbar spine MRI 07/05/2015. FINDINGS: MR HEAD FINDINGS: There is no evidence of acute infarct, intracranial hemorrhage, mass, midline shift, or extra-axial fluid collection. There is mild cerebral atrophy. Small  foci of T2 hyperintensity in the cerebral white matter bilaterally, stable to slightly increased from 2010 and nonspecific but compatible with mild-to-moderate chronic small vessel ischemic disease. No enhancing brain lesions are identified. There is at most minimally prominent dural enhancement without nodularity. Small enhancing lesions are present throughout the skull, progressive from 2010 (for example 3 cm left parietal lesion on image 28 of series 41). There is involvement of the clivus. Prior bilateral cataract extraction is noted. There is a trace left mastoid effusion. No significant paranasal sinus inflammatory disease. Major intracranial vascular flow voids are preserved. MR CERVICAL SPINE FINDINGS: Cervical spine straightening is unchanged. There is no significant listhesis. There is progressive osseous metastatic disease throughout the cervical spine since the 2015 MRI. There is now complete marrow replacement involving the C4 vertebral body with patchy enhancement including a 1 cm focus posteriorly. There is also a left-sided posterior element involvement at C4  which is new. No epidural tumor or spinal cord compression is identified. No cervical spinal cord signal abnormality is identified within limitations of motion artifact. Diffuse cervical disc degeneration is again seen. There is mild neural foraminal stenosis on the right at C3-4, on the left at C4-5, and likely bilaterally at C5-6 and C6-7. This is overall similar to the prior MRI. No significant spinal stenosis is seen. The paraspinal soft tissues are unremarkable. MR THORACIC SPINE FINDINGS: Diffuse osseous metastatic disease is again seen throughout the vertebral bodies and posterior elements of the thoracic spine. There has overall been mild progression from the 07/05/2015 thoracic spine MRI. There is now near complete involvement of the T10 vertebral body. There is mildly progressive marrow replacement involving multiple mid thoracic vertebral bodies as well as T1 and T2. The T3 vertebral body remains completely involved. There is a mild T12 superior endplate compression fracture which demonstrates slightly progressive height loss compared to the prior study. There is slightly prominent ventral epidural enhancement in the mid thoracic spine from T5 -T8, most conspicuous and mildly nodular in a left paracentral location at the T8 superior endplate level (series 37, image 33). This may reflect very small volume epidural tumor, prominent epidural venous plexus, or a combination of both. This has likely not significantly changed from the prior thoracic spine MRI, although there is much more motion artifact on axial sequences on that study. The spinal cord is normal in caliber and signal. There is no spinal cord compression. Multilevel disc degeneration is again seen with disc bulging and small disc protrusions without significant spinal canal or neural foraminal stenosis. Small left adrenal nodule and mild left upper pole renal caliectasis or cysts are partially visualized, grossly similar to the 09/07/2014  PET-CT. MR LUMBAR SPINE FINDINGS: Vertebral alignment is unchanged, with trace anterolisthesis again seen of L3 on L4. Osseous lesions throughout the lumbar spine do not appear significantly changed from the prior lumbar spine MRI, with the largest lesion in the L1 vertebral body. S3 level sacral osseous metastatic disease is partially visualized and grossly similar to the prior study. 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 Lumbar vertebral body heights are preserved. There is diffuse lumbar disc desiccation. Severe disc space height loss at L4-5 is unchanged. Multilevel disc and facet degeneration does not appear significantly changed, with mild right foraminal stenosis again seen at L3-4. There is no spinal stenosis. No epidural tumor is identified. Conus medullaris is normal in signal and terminates at L1. IMPRESSION: 1. No acute intracranial abnormality. No evidence of brain metastases. 2. Numerous osseous skull  metastases, progressed from 2010. 3. Widespread spinal osseous metastatic disease, with interval progression in the cervical and thoracic spine. No sizable epidural tumor identified. Slightly prominent ventral epidural enhancement in the mid thoracic spine is favored to mostly reflect epidural venous plexus, however trace epidural tumor is possible particularly at T8. 4. No spinal stenosis. 5. Mild T12 superior endplate compression fracture with slightly progressive height loss from 07/05/2015. Electronically Signed   By: Logan Bores M.D.   On: 08/25/2015 17:08   Mr Cervical Spine W Wo Contrast  08/25/2015  CLINICAL DATA:  Ongoing headache. Arm pain and pain beneath the sternum and ribs for 2 weeks. Right leg pain. Metastatic breast cancer. EXAM: MRI HEAD WITHOUT AND WITH CONTRAST; MRI CERVICAL SPINE WITHOUT AND WITH CONTRAST; MRI LUMBAR SPINE WITHOUT AND WITH CONTRAST; MRI THORACIC SPINE WITHOUT AND WITH CONTRAST TECHNIQUE: Multiplanar, multiecho pulse sequences of the brain and  surrounding structures were obtained according to standard protocol without and with intravenous contrast; Multiplanar and multiecho pulse sequences of the cervical spine, to include the craniocervical junction and cervicothoracic junction, were obtained according to standard protocol without and with intravenous contrast.; Multiplanar and multiecho pulse sequences of the lumbar spine were obtained without and with intravenous contrast.; Multiplanar and multiecho pulse sequences of the thoracic spine were obtained without and with intravenous contrast. CONTRAST:  22mL MULTIHANCE GADOBENATE DIMEGLUMINE 529 MG/ML IV SOLN COMPARISON:  Head CT 08/05/2015 and MRI 02/14/2009. Cervical spine MRI 05/14/2013. Thoracic and lumbar spine MRI 07/05/2015. FINDINGS: MR HEAD FINDINGS: There is no evidence of acute infarct, intracranial hemorrhage, mass, midline shift, or extra-axial fluid collection. There is mild cerebral atrophy. Small foci of T2 hyperintensity in the cerebral white matter bilaterally, stable to slightly increased from 2010 and nonspecific but compatible with mild-to-moderate chronic small vessel ischemic disease. No enhancing brain lesions are identified. There is at most minimally prominent dural enhancement without nodularity. Small enhancing lesions are present throughout the skull, progressive from 2010 (for example 3 cm left parietal lesion on image 28 of series 41). There is involvement of the clivus. Prior bilateral cataract extraction is noted. There is a trace left mastoid effusion. No significant paranasal sinus inflammatory disease. Major intracranial vascular flow voids are preserved. MR CERVICAL SPINE FINDINGS: Cervical spine straightening is unchanged. There is no significant listhesis. There is progressive osseous metastatic disease throughout the cervical spine since the 2015 MRI. There is now complete marrow replacement involving the C4 vertebral body with patchy enhancement including a 1 cm  focus posteriorly. There is also a left-sided posterior element involvement at C4 which is new. No epidural tumor or spinal cord compression is identified. No cervical spinal cord signal abnormality is identified within limitations of motion artifact. Diffuse cervical disc degeneration is again seen. There is mild neural foraminal stenosis on the right at C3-4, on the left at C4-5, and likely bilaterally at C5-6 and C6-7. This is overall similar to the prior MRI. No significant spinal stenosis is seen. The paraspinal soft tissues are unremarkable. MR THORACIC SPINE FINDINGS: Diffuse osseous metastatic disease is again seen throughout the vertebral bodies and posterior elements of the thoracic spine. There has overall been mild progression from the 07/05/2015 thoracic spine MRI. There is now near complete involvement of the T10 vertebral body. There is mildly progressive marrow replacement involving multiple mid thoracic vertebral bodies as well as T1 and T2. The T3 vertebral body remains completely involved. There is a mild T12 superior endplate compression fracture which demonstrates slightly progressive height loss  compared to the prior study. There is slightly prominent ventral epidural enhancement in the mid thoracic spine from T5 -T8, most conspicuous and mildly nodular in a left paracentral location at the T8 superior endplate level (series 37, image 33). This may reflect very small volume epidural tumor, prominent epidural venous plexus, or a combination of both. This has likely not significantly changed from the prior thoracic spine MRI, although there is much more motion artifact on axial sequences on that study. The spinal cord is normal in caliber and signal. There is no spinal cord compression. Multilevel disc degeneration is again seen with disc bulging and small disc protrusions without significant spinal canal or neural foraminal stenosis. Small left adrenal nodule and mild left upper pole renal  caliectasis or cysts are partially visualized, grossly similar to the 09/07/2014 PET-CT. MR LUMBAR SPINE FINDINGS: Vertebral alignment is unchanged, with trace anterolisthesis again seen of L3 on L4. Osseous lesions throughout the lumbar spine do not appear significantly changed from the prior lumbar spine MRI, with the largest lesion in the L1 vertebral body. S3 level sacral osseous metastatic disease is partially visualized and grossly similar to the prior study. 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 Lumbar vertebral body heights are preserved. There is diffuse lumbar disc desiccation. Severe disc space height loss at L4-5 is unchanged. Multilevel disc and facet degeneration does not appear significantly changed, with mild right foraminal stenosis again seen at L3-4. There is no spinal stenosis. No epidural tumor is identified. Conus medullaris is normal in signal and terminates at L1. IMPRESSION: 1. No acute intracranial abnormality. No evidence of brain metastases. 2. Numerous osseous skull metastases, progressed from 2010. 3. Widespread spinal osseous metastatic disease, with interval progression in the cervical and thoracic spine. No sizable epidural tumor identified. Slightly prominent ventral epidural enhancement in the mid thoracic spine is favored to mostly reflect epidural venous plexus, however trace epidural tumor is possible particularly at T8. 4. No spinal stenosis. 5. Mild T12 superior endplate compression fracture with slightly progressive height loss from 07/05/2015. Electronically Signed   By: Logan Bores M.D.   On: 08/25/2015 17:08   Mr Thoracic Spine W Wo Contrast  08/25/2015  CLINICAL DATA:  Ongoing headache. Arm pain and pain beneath the sternum and ribs for 2 weeks. Right leg pain. Metastatic breast cancer. EXAM: MRI HEAD WITHOUT AND WITH CONTRAST; MRI CERVICAL SPINE WITHOUT AND WITH CONTRAST; MRI LUMBAR SPINE WITHOUT AND WITH CONTRAST; MRI THORACIC SPINE WITHOUT AND WITH  CONTRAST TECHNIQUE: Multiplanar, multiecho pulse sequences of the brain and surrounding structures were obtained according to standard protocol without and with intravenous contrast; Multiplanar and multiecho pulse sequences of the cervical spine, to include the craniocervical junction and cervicothoracic junction, were obtained according to standard protocol without and with intravenous contrast.; Multiplanar and multiecho pulse sequences of the lumbar spine were obtained without and with intravenous contrast.; Multiplanar and multiecho pulse sequences of the thoracic spine were obtained without and with intravenous contrast. CONTRAST:  95mL MULTIHANCE GADOBENATE DIMEGLUMINE 529 MG/ML IV SOLN COMPARISON:  Head CT 08/05/2015 and MRI 02/14/2009. Cervical spine MRI 05/14/2013. Thoracic and lumbar spine MRI 07/05/2015. FINDINGS: MR HEAD FINDINGS: There is no evidence of acute infarct, intracranial hemorrhage, mass, midline shift, or extra-axial fluid collection. There is mild cerebral atrophy. Small foci of T2 hyperintensity in the cerebral white matter bilaterally, stable to slightly increased from 2010 and nonspecific but compatible with mild-to-moderate chronic small vessel ischemic disease. No enhancing brain lesions are identified. There is at  most minimally prominent dural enhancement without nodularity. Small enhancing lesions are present throughout the skull, progressive from 2010 (for example 3 cm left parietal lesion on image 28 of series 41). There is involvement of the clivus. Prior bilateral cataract extraction is noted. There is a trace left mastoid effusion. No significant paranasal sinus inflammatory disease. Major intracranial vascular flow voids are preserved. MR CERVICAL SPINE FINDINGS: Cervical spine straightening is unchanged. There is no significant listhesis. There is progressive osseous metastatic disease throughout the cervical spine since the 2015 MRI. There is now complete marrow replacement  involving the C4 vertebral body with patchy enhancement including a 1 cm focus posteriorly. There is also a left-sided posterior element involvement at C4 which is new. No epidural tumor or spinal cord compression is identified. No cervical spinal cord signal abnormality is identified within limitations of motion artifact. Diffuse cervical disc degeneration is again seen. There is mild neural foraminal stenosis on the right at C3-4, on the left at C4-5, and likely bilaterally at C5-6 and C6-7. This is overall similar to the prior MRI. No significant spinal stenosis is seen. The paraspinal soft tissues are unremarkable. MR THORACIC SPINE FINDINGS: Diffuse osseous metastatic disease is again seen throughout the vertebral bodies and posterior elements of the thoracic spine. There has overall been mild progression from the 07/05/2015 thoracic spine MRI. There is now near complete involvement of the T10 vertebral body. There is mildly progressive marrow replacement involving multiple mid thoracic vertebral bodies as well as T1 and T2. The T3 vertebral body remains completely involved. There is a mild T12 superior endplate compression fracture which demonstrates slightly progressive height loss compared to the prior study. There is slightly prominent ventral epidural enhancement in the mid thoracic spine from T5 -T8, most conspicuous and mildly nodular in a left paracentral location at the T8 superior endplate level (series 37, image 33). This may reflect very small volume epidural tumor, prominent epidural venous plexus, or a combination of both. This has likely not significantly changed from the prior thoracic spine MRI, although there is much more motion artifact on axial sequences on that study. The spinal cord is normal in caliber and signal. There is no spinal cord compression. Multilevel disc degeneration is again seen with disc bulging and small disc protrusions without significant spinal canal or neural foraminal  stenosis. Small left adrenal nodule and mild left upper pole renal caliectasis or cysts are partially visualized, grossly similar to the 09/07/2014 PET-CT. MR LUMBAR SPINE FINDINGS: Vertebral alignment is unchanged, with trace anterolisthesis again seen of L3 on L4. Osseous lesions throughout the lumbar spine do not appear significantly changed from the prior lumbar spine MRI, with the largest lesion in the L1 vertebral body. S3 level sacral osseous metastatic disease is partially visualized and grossly similar to the prior study. 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 Lumbar vertebral body heights are preserved. There is diffuse lumbar disc desiccation. Severe disc space height loss at L4-5 is unchanged. Multilevel disc and facet degeneration does not appear significantly changed, with mild right foraminal stenosis again seen at L3-4. There is no spinal stenosis. No epidural tumor is identified. Conus medullaris is normal in signal and terminates at L1. IMPRESSION: 1. No acute intracranial abnormality. No evidence of brain metastases. 2. Numerous osseous skull metastases, progressed from 2010. 3. Widespread spinal osseous metastatic disease, with interval progression in the cervical and thoracic spine. No sizable epidural tumor identified. Slightly prominent ventral epidural enhancement in the mid thoracic spine is favored  to mostly reflect epidural venous plexus, however trace epidural tumor is possible particularly at T8. 4. No spinal stenosis. 5. Mild T12 superior endplate compression fracture with slightly progressive height loss from 07/05/2015. Electronically Signed   By: Logan Bores M.D.   On: 08/25/2015 17:08   Mr Lumbar Spine W Wo Contrast  08/25/2015  CLINICAL DATA:  Ongoing headache. Arm pain and pain beneath the sternum and ribs for 2 weeks. Right leg pain. Metastatic breast cancer. EXAM: MRI HEAD WITHOUT AND WITH CONTRAST; MRI CERVICAL SPINE WITHOUT AND WITH CONTRAST; MRI LUMBAR SPINE  WITHOUT AND WITH CONTRAST; MRI THORACIC SPINE WITHOUT AND WITH CONTRAST TECHNIQUE: Multiplanar, multiecho pulse sequences of the brain and surrounding structures were obtained according to standard protocol without and with intravenous contrast; Multiplanar and multiecho pulse sequences of the cervical spine, to include the craniocervical junction and cervicothoracic junction, were obtained according to standard protocol without and with intravenous contrast.; Multiplanar and multiecho pulse sequences of the lumbar spine were obtained without and with intravenous contrast.; Multiplanar and multiecho pulse sequences of the thoracic spine were obtained without and with intravenous contrast. CONTRAST:  41mL MULTIHANCE GADOBENATE DIMEGLUMINE 529 MG/ML IV SOLN COMPARISON:  Head CT 08/05/2015 and MRI 02/14/2009. Cervical spine MRI 05/14/2013. Thoracic and lumbar spine MRI 07/05/2015. FINDINGS: MR HEAD FINDINGS: There is no evidence of acute infarct, intracranial hemorrhage, mass, midline shift, or extra-axial fluid collection. There is mild cerebral atrophy. Small foci of T2 hyperintensity in the cerebral white matter bilaterally, stable to slightly increased from 2010 and nonspecific but compatible with mild-to-moderate chronic small vessel ischemic disease. No enhancing brain lesions are identified. There is at most minimally prominent dural enhancement without nodularity. Small enhancing lesions are present throughout the skull, progressive from 2010 (for example 3 cm left parietal lesion on image 28 of series 41). There is involvement of the clivus. Prior bilateral cataract extraction is noted. There is a trace left mastoid effusion. No significant paranasal sinus inflammatory disease. Major intracranial vascular flow voids are preserved. MR CERVICAL SPINE FINDINGS: Cervical spine straightening is unchanged. There is no significant listhesis. There is progressive osseous metastatic disease throughout the cervical  spine since the 2015 MRI. There is now complete marrow replacement involving the C4 vertebral body with patchy enhancement including a 1 cm focus posteriorly. There is also a left-sided posterior element involvement at C4 which is new. No epidural tumor or spinal cord compression is identified. No cervical spinal cord signal abnormality is identified within limitations of motion artifact. Diffuse cervical disc degeneration is again seen. There is mild neural foraminal stenosis on the right at C3-4, on the left at C4-5, and likely bilaterally at C5-6 and C6-7. This is overall similar to the prior MRI. No significant spinal stenosis is seen. The paraspinal soft tissues are unremarkable. MR THORACIC SPINE FINDINGS: Diffuse osseous metastatic disease is again seen throughout the vertebral bodies and posterior elements of the thoracic spine. There has overall been mild progression from the 07/05/2015 thoracic spine MRI. There is now near complete involvement of the T10 vertebral body. There is mildly progressive marrow replacement involving multiple mid thoracic vertebral bodies as well as T1 and T2. The T3 vertebral body remains completely involved. There is a mild T12 superior endplate compression fracture which demonstrates slightly progressive height loss compared to the prior study. There is slightly prominent ventral epidural enhancement in the mid thoracic spine from T5 -T8, most conspicuous and mildly nodular in a left paracentral location at the T8 superior endplate level (  series 37, image 33). This may reflect very small volume epidural tumor, prominent epidural venous plexus, or a combination of both. This has likely not significantly changed from the prior thoracic spine MRI, although there is much more motion artifact on axial sequences on that study. The spinal cord is normal in caliber and signal. There is no spinal cord compression. Multilevel disc degeneration is again seen with disc bulging and small  disc protrusions without significant spinal canal or neural foraminal stenosis. Small left adrenal nodule and mild left upper pole renal caliectasis or cysts are partially visualized, grossly similar to the 09/07/2014 PET-CT. MR LUMBAR SPINE FINDINGS: Vertebral alignment is unchanged, with trace anterolisthesis again seen of L3 on L4. Osseous lesions throughout the lumbar spine do not appear significantly changed from the prior lumbar spine MRI, with the largest lesion in the L1 vertebral body. S3 level sacral osseous metastatic disease is partially visualized and grossly similar to the prior study. 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 Lumbar vertebral body heights are preserved. There is diffuse lumbar disc desiccation. Severe disc space height loss at L4-5 is unchanged. Multilevel disc and facet degeneration does not appear significantly changed, with mild right foraminal stenosis again seen at L3-4. There is no spinal stenosis. No epidural tumor is identified. Conus medullaris is normal in signal and terminates at L1. IMPRESSION: 1. No acute intracranial abnormality. No evidence of brain metastases. 2. Numerous osseous skull metastases, progressed from 2010. 3. Widespread spinal osseous metastatic disease, with interval progression in the cervical and thoracic spine. No sizable epidural tumor identified. Slightly prominent ventral epidural enhancement in the mid thoracic spine is favored to mostly reflect epidural venous plexus, however trace epidural tumor is possible particularly at T8. 4. No spinal stenosis. 5. Mild T12 superior endplate compression fracture with slightly progressive height loss from 07/05/2015. Electronically Signed   By: Logan Bores M.D.   On: 08/25/2015 17:08   Nm Pet Image Restag (ps) Skull Base To Thigh  08/31/2015  CLINICAL DATA:  Subsequent treatment strategy for metastatic breast carcinoma to bone. EXAM: NUCLEAR MEDICINE PET SKULL BASE TO THIGH TECHNIQUE: 6.3 mCi  F-18 FDG was injected intravenously. Full-ring PET imaging was performed from the skull base to thigh after the radiotracer. CT data was obtained and used for attenuation correction and anatomic localization. FASTING BLOOD GLUCOSE:  Value: 103 mg/dl COMPARISON:  09/07/2014 FINDINGS: NECK No hypermetabolic lymph nodes in the neck. CHEST No hypermetabolic mediastinal or hilar nodes. No suspicious pulmonary nodules on the CT scan. Biapical pleural- parenchymal scarring appears stable. Previously seen area of hypermetabolic airspace disease in the left upper lobe has nearly completely resolved, consistent with resolving infectious or inflammatory process. ABDOMEN/PELVIS No abnormal hypermetabolic activity within the liver, pancreas, adrenal glands, or spleen. No hypermetabolic lymph nodes in the abdomen or pelvis. Tiny left hepatic lobe cyst remains stable. Prior cholecystectomy noted. 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 11 mm left adrenal nodule remains stable and shows no metabolic activity, consistent with benign adrenal adenoma. Colonic diverticulosis noted, without evidence of diverticulitis. SKELETON Mixed lytic and sclerotic bone metastases again seen within spine, pelvis, sternum, and proximal left femur. These appear stable and show no significant change in low-grade metabolic activity, consistent with largely treated osseous metastatic disease. IMPRESSION: Near complete resolution of hypermetabolic airspace disease in left upper lobe, consistent with resolving infectious or inflammatory process. No significant change in appearance of largely treated osseous metastatic disease. No new or progressive metastatic disease identified. Electronically Signed   By:  Earle Gell M.D.   On: 08/31/2015 12:48   Dg Chest Port 1 View  09/15/2015  CLINICAL DATA:  Short breath, cough, fever EXAM: PORTABLE CHEST 1 VIEW COMPARISON:  08/31/2015 FINDINGS: Normal cardiac silhouette with ectatic aorta. Increased density in the  LEFT upper lobe. The biapical densities are not changed from CT. Small LEFT effusion. No pneumothorax. IMPRESSION: 1. Increasing density in the LEFT upper lobe. Concerning for edema or infection. Electronically Signed   By: Suzy Bouchard M.D.   On: 09/15/2015 07:26   US Abdomen Limited Ruq  09/15/2015  CLINICAL DATA:  Elevated liver function tests. EXAM: US ABDOMEN LIMITED - RIGHT UPPER QUADRANT COMPARISON:  08/31/2015, PET-CT. FINDINGS: Gallbladder: Surgically absent Common bile duct: Diameter: Dilated to a maximum of 17 mm, unchanged from the recent prior PET-CT. No sonographic evidence of a duct stone. Liver: Cyst in the left liver lobe measuring 13 somewhat mottled appearance of the liver in its more posterior aspect, which could be technique. The possibility of numerous small infiltrating lesion should be considered in the patient's clinical setting. Liver is normal in size and overall echogenicity. IMPRESSION: 1. Chronically dilated common bile duct. Status post cholecystectomy. 2. Mottled appearance of the posterior liver. Consider followup liver MRI with and without contrast. Electronically Signed   By: Lajean Manes M.D.   On: 09/15/2015 10:04     LOS: 3 days   Oren Binet, MD  [facility] Pager:[phone]  If 7PM-7AM, please contact night-coverage [email or web address] Password [email or web address] 09/17/2015, 11:01 AM

## 2015-09-17 NOTE — Consult Note (Addendum)
Consultation Note Date: 09/17/2015   Patient Name: [name]  DOB: 30-Mar-1946  MRN: [id number]  Age / Sex: 70 y.o., female  PCP: Curlene Labrum, MD Referring Physician: Jonetta Osgood, MD  Reason for Consultation: Pain control  HPI/Patient Profile: 70 y.o. female  with past medical history of Metastatic cancer  admitted on 09/14/2015 with healthcare associated pneumonia .   Clinical Assessment and Goals of Care:  Patient is a 70 y.o. female with history of metastatic breast cancer, admitted with healthcare associated pneumonia, generalized weakness and generalized bony pain.  Patient has been admitted with fever associated to healthcare associated pneumonia. She is on antibiotics. Palliative care consult it for complaints of generalized weakness and diffuse pain. She has extensive metastatic disease, symptom burden from osseous metastases. A shunt has had gradual progressive decline particularly accelerated since the last few weeks. She has a loss of appetite and generalized weakness. Chart review notes that patient underwent a recent PET scan on 5-4 showing numerous OCS metastases. She also had an MRI of the spine done on 4-28. She has multiple ossea spinal metastases. Outpatient oncology plan was for Port-A-Cath placement on 5-18 and discussions were held whether to proceed with comfort care additional treatment. Patient opted for additional treatment. Hence, she was due to start cyclophosphamide, methotrexate and fluorouracil on 10-02-15.  Agent is an age-appropriate lady resting in bed. She has flat affect. She does not engage much. However she does answer questions appropriately. She states that her pain control is better. She believes that this current regimen is working better for her as far as pain management. She is eagerly awaiting her breakfast this morning. She is asking for bedside nurse him in  with her pain medications. Discussed with bedside nurse. Patient has been able to get up out of bed and go to the bathroom by herself this morning. Her pain control is better since the last 24 hours. Regimen, see recommendations below. Thank you for the consult. I have also added bowel and antiemetic regimen. Continue to monitor. HCPOA: Has designated friend and cousin to be her HCPOA agents.     SUMMARY OF RECOMMENDATIONS   1. Agree with transdermal fentanyl. Patient started on 25 g patch. Can titrate up to 50 g patch in 48-72 hours since the 25 mics patch was placed. We agree with addition of IV Decadron as an adjuvant. 2. Patient has IV Dilaudid. She has used 5 mg of IV Dilaudid in the last 24 hours. Monitor use. 3. Patient also has morphine sulfate immediate release by mouth available on an as-needed basis for pain. She has required 60 mg of MSIR in the last 24 hours. Continue to monitor.  4. Agree that patient is very much appropriate for palliative discussions, goals of care discussions. This morning, patient wishes to keep the discussions limited to her pain regimen. There is no family or friends present at the bedside. Continue to work on pain management and building trust relationship.  Code Status/Advance Care Planning:  Full code  Symptom Management:    1. Agree with starting transdermal fentanyl for long-term pain control. Continue to titrate to 48-72 hours based on PRN medications use.   2. Agree with IV Dilaudid when necessary as well as MSIR by mouth when necessary for pain. Monitor use. Have added bowel and antiemetic regimen.  Palliative Prophylaxis:   Bowel Regimen  Additional Recommendations (Limitations, Scope, Preferences):  Full Scope Treatment  Psycho-social/Spiritual:   Desire for further Chaplaincy support:no  Additional Recommendations: Caregiving  Support/Resources  Prognosis:   Unable to determine but appears guarded given extensive history of  cancer and now with infection, pain and declining functional status.   Discharge Planning: recommend skilled [place] for rehab with Palliative care service follow-up      Primary Diagnoses: Present on Admission:  . HCAP (healthcare-associated pneumonia) . SIRS (systemic inflammatory response syndrome) (HCC) . Breast cancer metastasized to bone ([place]) . Anorexia . Essential hypertension . Protein-calorie malnutrition, severe ([place])  I have reviewed the medical record, interviewed the patient and family, and examined the patient. The following aspects are pertinent.  Past Medical History  Diagnosis Date  . Cancer (HCC)     RBreast, Lower back , Under l Arm  . Hypertension   . GERD (gastroesophageal reflux disease)   . Breast cancer metastasized to bone ([place]) 04/30/2011  . Radiation 01/15/2012    Lumbosacral spine 3500 cGy 14  fx  . Swollen R ankle 08/10/2012  . Fracture of ankle, medial malleolus, right, closed 08/10/2012  . Tubular adenoma 2011  . Hiatal hernia 2011  . Schatzki's ring   . Anorexia 03/02/2013  . Weight loss due to medication 03/02/2013  . C. difficile colitis   . Pneumonia   . Metastasis ([place])     to skull   Social History   Social History  . Marital Status: Widowed    Spouse Name: N/A  . Number of Children: N/A  . Years of Education: N/A   Social History Main Topics  . Smoking status: Former Smoker    Quit date: 11/22/1982  . Smokeless tobacco: Never Used     Comment: Quit smoking x 32 years  . Alcohol Use: No  . Drug Use: No  . Sexual Activity: Not Currently   Other Topics Concern  . None   Social History Narrative   Family History  Problem Relation Age of Onset  . Cancer Mother     Colon  . Cancer Father     Lung, Mouth,leg   Scheduled Meds: . ceFEPime (MAXIPIME) IV  1 g Intravenous Q12H  . dexamethasone  4 mg Intravenous Q12H  . feeding supplement  1 Container Oral BID BM  . fentaNYL  25 mcg Transdermal Q72H  . lidocaine  1  patch Transdermal Q24H  . vancomycin  500 mg Intravenous Q12H  . Warfarin - Pharmacist Dosing Inpatient   Does not apply q1800   Continuous Infusions:  PRN Meds:.HYDROmorphone (DILAUDID) injection, magic mouthwash w/lidocaine, morphine, ondansetron (ZOFRAN) IV, polyvinyl alcohol, senna-docusate Medications Prior to Admission:  Prior to Admission medications   Medication Sig Start Date End Date Taking? Authorizing Provider  carvedilol (COREG) 3.125 MG tablet Take 1 tablet (3.125 mg total) by mouth 2 (two) times daily with a meal. 09/06/15  Yes Chauncey Cruel, MD  dexamethasone (DECADRON) 4 MG tablet Taper dose of 4 mg daily until 5/18 then decrease to 2 mg daily until 5/25 then decrease to 2 mg every other day until 6/1 and then may stop 09/07/15  Yes Chauncey Cruel, MD  HYDROcodone-acetaminophen (NORCO) 10-325 MG tablet Take 1-2 tablets by mouth every 4 (four) hours as needed for moderate pain or severe pain. 09/06/15  Yes Chauncey Cruel, MD  hyoscyamine (ANASPAZ) 0.125 MG TBDP disintergrating tablet DISSOLVE 1 TABLET(0.125 MG) UNDER THE TONGUE FOUR TIMES DAILY BEFORE MEALS AND AT BEDTIME AS NEEDED FOR DIARRHEA OR ABDOMINAL CRAMPS 06/06/15  Yes Orvil Feil, NP  loperamide (IMODIUM A-D) 2 MG tablet Take 2 mg by mouth 4 (four) times daily as needed for diarrhea or loose stools.   Yes Historical Provider, MD  mometasone (ELOCON) 0.1 % lotion apply topically daily as needed for pain 10/17/14  Yes Historical Provider, MD  morphine (MSIR) 15 MG tablet Take 1 tablet (15 mg total) by mouth every 12 (twelve) hours as needed for severe pain. Once every 12 hours as needed Patient taking differently: Take 15 mg by mouth every 12 (twelve) hours as needed for severe pain.  09/06/15  Yes Virgie Dad Magrinat, MD  ondansetron (ZOFRAN-ODT) 8 MG disintegrating tablet DISSOLVE 1 TABLET ON THE TONGUE EVERY 8 HOURS AS NEEDED FOR NAUSEA 02/09/15  Yes Chauncey Cruel, MD  Polyethyl Glycol-Propyl Glycol (SYSTANE OP)  Apply 1 drop to eye daily as needed (dry eyes).   Yes Historical Provider, MD  warfarin (COUMADIN) 4 MG tablet Take 1 tablet (4 mg total) by mouth daily. Patient taking differently: Take 2-4 mg by mouth daily. Take 2 mg on Mon thru Thurs & Take 4 mg on Fri, Sat, Sun 02/28/15   Chauncey Cruel, MD   Allergies  Allergen Reactions  . Aspirin Nausea And Vomiting  . Boost Pudding [Nutritional Supplements] Diarrhea  . Percodan [Oxycodone-Aspirin] Nausea Only  . Codeine Nausea Only   Review of Systems + for generalized pain.   Physical Exam Age-appropriate appearing lady resting in bed Awake alert oriented answers all questions appropriately Flat affect Complains of mild to moderate generalized discomfort particularly in the back S1-S2 Clear to auscultation anterior lung fields No edema  Vital Signs: BP 125/74 mmHg  Pulse 79  Temp(Src) 97.7 F (36.5 C) (Oral)  Resp 20  Ht 5\' 2"  (1.575 m)  Wt 59.648 kg (131 lb 8 oz)  BMI 24.05 kg/m2  SpO2 93% Pain Assessment: 0-10   Pain Score: 7    SpO2: SpO2: 93 % O2 Device:SpO2: 93 % O2 Flow Rate: .O2 Flow Rate (L/min): 2 L/min  IO: Intake/output summary:  Intake/Output Summary (Last 24 hours) at 09/17/15 0915 Last data filed at 09/17/15 [id number]  Gross per 24 hour  Intake    680 ml  Output    200 ml  Net    480 ml    LBM: Last BM Date: 09/16/15 Baseline Weight: Weight: 59.648 kg (131 lb 8 oz) Most recent weight: Weight: 59.648 kg (131 lb 8 oz)     Palliative Assessment/Data:   Flowsheet Rows        Most Recent Value   Intake Tab    Referral Department  Hospitalist   Unit at Time of Referral  Oncology Unit   Palliative Care Primary Diagnosis  Cancer   Palliative Care Type  New Palliative care   Reason for referral  Pain   Date first seen by Palliative Care  09/17/15   Clinical Assessment    Palliative Performance Scale Score  40%   Pain Max last 24 hours  7   Pain Min Last 24 hours  4   Dyspnea Max Last 24  Hours  4    Dyspnea Min Last 24 hours  3   Nausea Max Last 24 Hours  3   Nausea Min Last 24 Hours  2   Psychosocial & Spiritual Assessment    Palliative Care Outcomes    Patient/Family meeting held?  Yes   Who was at the meeting?  patient herself   Palliative Care follow-up planned  Yes, Facility      Time In:  8 Time Out: 9   Time Total: 60 min    Greater than 50%  of this time was spent counseling and coordinating care related to the above assessment and plan.  Signed by: Loistine Chance, MD  [id number] Please contact Palliative Medicine Team phone at [phone] for questions and concerns.  For individual provider: See Shea Evans

## 2015-09-17 NOTE — Progress Notes (Signed)
ANTICOAGULATION CONSULT NOTE - Follow-Up  Pharmacy Consult for Warfarin Indication: hx extensive superficial vein thrombosis  Allergies  Allergen Reactions  . Aspirin Nausea And Vomiting  . Boost Pudding [Nutritional Supplements] Diarrhea  . Percodan [Oxycodone-Aspirin] Nausea Only  . Codeine Nausea Only   Patient Measurements: Height: 5\' 2"  (157.5 cm) Weight: 131 lb 8 oz (59.648 kg) IBW/kg (Calculated) : 50.1  Vital Signs: Temp: 97.7 F (36.5 C) (05/21 0543) Temp Source: Oral (05/21 0543) BP: 125/74 mmHg (05/21 0700) Pulse Rate: 79 (05/21 0543)  Labs:  Recent Labs  09/15/15 0521 09/16/15 0532 09/17/15 0530  HGB 8.7*  --  9.1*  HCT 26.7*  --  27.6*  PLT 112*  --  148*  LABPROT 26.2* 21.5* 19.0*  INR 2.54* 1.95* 1.66*  CREATININE 0.76 0.62 0.64    Estimated Creatinine Clearance: 51 mL/min (by C-G formula based on Cr of 0.64).  Medications:  Scheduled:  . ceFEPime (MAXIPIME) IV  1 g Intravenous Q12H  . dexamethasone  4 mg Intravenous Q12H  . feeding supplement  1 Container Oral BID BM  . fentaNYL  25 mcg Transdermal Q72H  . lidocaine  1 patch Transdermal Q24H  . pantoprazole  40 mg Oral Daily  . Warfarin - Pharmacist Dosing Inpatient   Does not apply q1800   Assessment: 79 yoF with hx of mets breast cancer on letrozole+ palbocicib, denosumab with plan for PAC placement on 5/18 for chemo to start in June. She was also on warfarin PTA for hx extensive vein thrombosis. Outpatient records patient was supposed to hold her warfarin three days prior to [facility] procedure.  However,  her INR was supratherapeutic at 9 on 5/10 and patient was instructed to hold her warfarin at that time.  Patient has not gotten PAC yet. She presented to the ED on 5/18 with c/o fever, chills, cough and worsening weakness. INR was 1.90 on admission  Home warfarin regimen: 2 mg daily except 4 mg on FSS   Today, 09/17/2015: - INR decreasing to 1.66 this am, dose reduced 5/19 for rate of INR rise  from 5/18 to 5/19 - CBC: Hgb stble, pltc improved - no bleeding documented - drug-drug intxns: abx - cardiac diet  Goal of Therapy:  INR 2-3 Monitor platelets by anticoagulation protocol: Yes   Plan:  - warfarin 4 mg PO x1 today for INR trending downward (subtherapeutic) - Please advise if/when warfarin needs to be held, if to proceed with port-a-cath placement with this admission - per notes this appears will be delayed d/t fever - daily INR - monitor for s/s bleeding  Doreene Eland, PharmD, BCPS.   Pager: [phone] 09/17/2015 12:45 PM

## 2015-09-18 DIAGNOSIS — E860 Dehydration: Secondary | ICD-10-CM

## 2015-09-18 DIAGNOSIS — C7951 Secondary malignant neoplasm of bone: Secondary | ICD-10-CM

## 2015-09-18 DIAGNOSIS — R627 Adult failure to thrive: Secondary | ICD-10-CM

## 2015-09-18 DIAGNOSIS — C44501 Unspecified malignant neoplasm of skin of breast: Secondary | ICD-10-CM

## 2015-09-18 DIAGNOSIS — J189 Pneumonia, unspecified organism: Secondary | ICD-10-CM

## 2015-09-18 DIAGNOSIS — Z86711 Personal history of pulmonary embolism: Secondary | ICD-10-CM

## 2015-09-18 DIAGNOSIS — C773 Secondary and unspecified malignant neoplasm of axilla and upper limb lymph nodes: Secondary | ICD-10-CM

## 2015-09-18 DIAGNOSIS — M79661 Pain in right lower leg: Secondary | ICD-10-CM

## 2015-09-18 LAB — PROTIME-INR
INR: 2.17 — AB (ref 0.00–1.49)
PROTHROMBIN TIME: 23.3 s — AB (ref 11.6–15.2)

## 2015-09-18 MED ORDER — POLYETHYLENE GLYCOL 3350 17 G PO PACK
17.0000 g | PACK | Freq: Every day | ORAL | Status: DC
  Administered 2015-09-18 – 2015-09-19 (×2): 17 g via ORAL
  Filled 2015-09-18 (×4): qty 1

## 2015-09-18 MED ORDER — WARFARIN SODIUM 2 MG PO TABS
2.0000 mg | ORAL_TABLET | Freq: Once | ORAL | Status: AC
  Administered 2015-09-18: 2 mg via ORAL
  Filled 2015-09-18: qty 1

## 2015-09-18 MED ORDER — DEXAMETHASONE 2 MG PO TABS
2.0000 mg | ORAL_TABLET | Freq: Two times a day (BID) | ORAL | Status: DC
  Administered 2015-09-18 – 2015-09-22 (×8): 2 mg via ORAL
  Filled 2015-09-18 (×10): qty 1

## 2015-09-18 MED ORDER — DOCUSATE SODIUM 100 MG PO CAPS
100.0000 mg | ORAL_CAPSULE | Freq: Every day | ORAL | Status: DC
  Administered 2015-09-18 – 2015-09-21 (×4): 100 mg via ORAL
  Filled 2015-09-18 (×5): qty 1

## 2015-09-18 NOTE — NC FL2 (Addendum)
[place] LEVEL OF CARE SCREENING TOOL     IDENTIFICATION  Patient Name: [name] Birthdate: [birth year] Sex: female Admission Date (Current Location): 09/14/2015  [place] and [place] Number:   Mallie Mussel, [place])   Facility and Address:  [name],  [place] [address] [place]      Provider Number: [id number]  Attending Physician Name and Address:  Jonetta Osgood, MD  Relative Name and Phone Number:       Current Level of Care: Hospital Recommended Level of Care: [place] Prior Approval Number:    Date Approved/Denied:   PASRR Number: [id number]  Discharge Plan: Home    Current Diagnoses: Patient Active Problem List   Diagnosis Date Noted  . Encounter for palliative care   . Generalized abdominal pain   . Elevated LFTs   . SIRS (systemic inflammatory response syndrome) ([place]) 09/14/2015  . Pain from bone metastases ([place]) 06/28/2015  . Cancer of overlapping sites of right breast ([place]) 05/11/2015  . Bilateral breast cancer ([place]) 05/11/2015  . C. difficile colitis 02/08/2015  . Protein-calorie malnutrition, severe ([place]) 01/25/2015  . Clostridium difficile diarrhea   . Hypokalemia 01/24/2015  . AKI (acute kidney injury) ([place]) 01/24/2015  . Essential hypertension 01/24/2015  . Abdominal pain   . Dehydration 09/09/2014  . HCAP (healthcare-associated pneumonia) 08/09/2014  . Cavitating mass in left upper lung lobe 08/09/2014  . Anorexia 03/02/2013  . Weight loss due to medication 03/02/2013  . Swollen R ankle 08/10/2012  . Fracture of ankle, medial malleolus, right, closed 08/10/2012  . Rotator cuff tear 09/24/2011  . Breast cancer metastasized to bone ([place]) 04/30/2011  . Rash 03/01/2011  . Seroma, post-traumatic 12/25/2010  . RECTAL BLEEDING 12/08/2009  . FLATULENCE 12/08/2009  . OTH NONSPC ABN FINDNG RAD&OTH EXM BODY STRUCTURE 01/10/2009  . GERD 12/26/2008  . Osteoarthritis 12/26/2008  . Nausea without  vomiting 12/26/2008  . Diarrhea 12/26/2008  . Personal history of thrombophlebitis 12/26/2008  . COLONIC POLYPS, HX OF 12/26/2008    Orientation RESPIRATION BLADDER Height & Weight     Self, Situation, Time, Place  Normal Continent Weight: 131 lb 8 oz (59.648 kg) Height:  5\' 2"  (157.5 cm)  BEHAVIORAL SYMPTOMS/MOOD NEUROLOGICAL BOWEL NUTRITION STATUS      Incontinent Diet (Heart Healthy)  AMBULATORY STATUS COMMUNICATION OF NEEDS Skin   Extensive Assist Verbally Normal                       Personal Care Assistance Level of Assistance  Bathing, Dressing Bathing Assistance: Limited assistance   Dressing Assistance: Limited assistance     Functional Limitations Info             SPECIAL CARE FACTORS FREQUENCY  PT (By licensed PT), OT (By licensed OT)     PT Frequency: 5 OT Frequency: 5            Contractures      Additional Factors Info  Code Status, Allergies (Chemotherapy at [facility] starting June 5th) Code Status Info: Fullcode Allergies Info: Aspirin, Boost Pudding, Percodan, Codeine           Current Medications (09/18/2015):  This is the current hospital active medication list Current Facility-Administered Medications  Medication Dose Route Frequency Provider Last Rate Last Dose  . ceFEPIme (MAXIPIME) 1 g in dextrose 5 % 50 mL IVPB  1 g Intravenous Q12H Minda Ditto, RPH   1 g at 09/18/15 0544  .  dexamethasone (DECADRON) tablet 2 mg  2 mg Oral Q12H Chauncey Cruel, MD      . docusate sodium (COLACE) capsule 100 mg  100 mg Oral Daily Chauncey Cruel, MD      . feeding supplement (BOOST / RESOURCE BREEZE) liquid 1 Container  1 Container Oral BID BM Jonetta Osgood, MD   1 Container at 09/18/15 1000  . fentaNYL (DURAGESIC - dosed mcg/hr) patch 25 mcg  25 mcg Transdermal Q72H Jonetta Osgood, MD   25 mcg at 09/16/15 0948  . gi cocktail (Maalox,Lidocaine,Donnatal)  30 mL Oral TID PRN Jonetta Osgood, MD   30 mL at 09/17/15 1018   . HYDROmorphone (DILAUDID) injection 1 mg  1 mg Intravenous Q2H PRN Jonetta Osgood, MD   1 mg at 09/17/15 2258  . lidocaine (LIDODERM) 5 % 1 patch  1 patch Transdermal Q24H Jonetta Osgood, MD   1 patch at 09/17/15 1000  . magic mouthwash w/lidocaine  10 mL Oral TID PRN Jonetta Osgood, MD   10 mL at 09/15/15 1002  . morphine (MSIR) tablet 15 mg  15 mg Oral Q4H PRN Jonetta Osgood, MD   15 mg at 09/18/15 0900  . ondansetron (ZOFRAN) injection 4 mg  4 mg Intravenous Q6H PRN Loistine Chance, MD      . pantoprazole (PROTONIX) EC tablet 40 mg  40 mg Oral Daily Jonetta Osgood, MD   40 mg at 09/18/15 0900  . polyethylene glycol (MIRALAX / GLYCOLAX) packet 17 g  17 g Oral Daily Chauncey Cruel, MD      . polyvinyl alcohol (LIQUIFILM TEARS) 1.4 % ophthalmic solution 1 drop  1 drop Both Eyes Daily PRN Shanker Kristeen Mans, MD      . senna-docusate (Senokot-S) tablet 2 tablet  2 tablet Oral QHS PRN Loistine Chance, MD      . warfarin (COUMADIN) tablet 2 mg  2 mg Oral ONCE-1800 Anh P Pham, RPH      . Warfarin - Pharmacist Dosing Inpatient   Does not apply q1800 Minda Ditto, RPH   0  at 09/15/15 1800     Discharge Medications: Please see discharge summary for a list of discharge medications.  Relevant Imaging Results:  Relevant Lab Results:   Additional Information SSN: [id number]  Standley Brooking, LCSW

## 2015-09-18 NOTE — Consult Note (Signed)
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 She does not have an alarm right there so it was not until the next day, when she was supposed to go to a radiation appointment, that her friend [name] call and was not able to get through. The friend came to the house, founder in this situation, and called the ambulance, who initially to the patient to higher near hospital in [place] and then transferred her directly from the emergency room there due to the [facility] emergency room. Here she was found to have a temperature of 100.7 and a left upper lobe infiltrate. She was in uncontrolled pain. She was exceedingly weak. She was hydrated, started on antibiotics, and started on pain medication. Her condition has now stabilized some. Active issues include pain management, management of the pneumonia, concerns regarding timing of port placement and of course continuing concerns regarding her metastatic breast cancer.  REVIEW OF SYSTEMS: [name] tells me today her pain is worse than yesterday. The pain is particularly in the right lower leg and the plan is for her to receive palliative radiation to that area. Of course she has pain in other sites of bony metastatic disease. She was started on a fentanyl patch at 25 g per hour and  the plan is to increase that to 50 g since she is still using a significant amount of breakthrough pain medication. She has not had a bowel movement in at least 2 days, she says. She denies any bleeding. She tells me her fever "broke" and she has very minimal cough. Partly the problem is that coughing is very painful to her. She denies any purulent sputum. There has been no bleeding despite her being on chronic Coumadin. There were no friends or family in the room  BREAST CANCER HISTORY: From Dr. Adria Dill summary 07/14/2013::  "Complex 70 year old woman with history of ER-positive and ER negative metachronous primary bilateral breast cancers.  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 Progression in bone while on tamoxifen September 2011. Initial complete response to monthly Faslodex injections.She developed a malignant left axillary lymph node with no clear primary in the left breast November 2011. This tumor was ER negative. HER-2 negative. While under evaluation for this lesion she developed a new lesion in the right breast which was ER positive. She underwent bilateral mastectomies 06/28/2010. She was continued on hormonal therapy with monthly Faslodex injections in view of the ER positive component of her tumor with a complete response to this hormone in bone. She underwent radiation to the left chest and axilla subsequent to the mastectomies. She developed a cutaneous recurrence on the right chest wall along the medial aspect of the right mastectomy scar, biopsy proven, initially detected on clinical exam  by her radiation oncologist on 02/14/2011. She was treated with electron beam radiation.. 3000 cGy in 15 fractions through December 2012. Further progression in bone in August 2013. Palliative radiation to lumbosacral spine  September through October 2013. Brief trial of Xeloda November 2013 through January 2014. Change to tamoxifen plus [employer] February 2014 stopped for progression and 25 pound weight loss November 2014. Trial of Megace 80 mg twice a day with improved appetite but progressive rise in tumor marker and progressive low back pain with further progression on MRI of the spine done January 2015. Radiation to the lumbar spine February 2015. She started most recent salvage regimen with a combination of Ibrance plus Femara on 07/08/2013. It is too early to assess response."  The patient's breast cancer history is summarized in detailed below   PAST MEDICAL HISTORY: Past Medical History  Diagnosis Date  . Cancer (HCC)     RBreast, Lower back , Under l Arm  . Hypertension   . GERD (gastroesophageal reflux disease)   . Breast cancer metastasized to bone ([place]) 04/30/2011  . Radiation 01/15/2012    Lumbosacral spine 3500 cGy 14  fx  . Swollen R ankle 08/10/2012  . Fracture of ankle, medial malleolus, right, closed 08/10/2012  . Tubular adenoma 2011  . Hiatal hernia 2011  . Schatzki's ring   . Anorexia 03/02/2013  . Weight loss due to medication 03/02/2013  . C. difficile colitis   . Pneumonia   . 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 Gallbladder surgery  1991  . Mastectomy  06/28/10    bilateral- Dr. Rosebud Poles, [place]  . Inguinal hernia repair  90's    right  . Parathyroidectomy  90's  . Carpal tunnel release  90's  . Oophorectomy  90's    right  . Lymph node dissection    . Breast surgery    . Cholecystectomy    . Colonoscopy  01/08/10    Dr. [name]- anal tag,hemorrhoid o/w normal rectum, pancolonic diverticula, diminutive polyp in the base of the cecum= tubular adenoma on bx. poor prep  . Esophagogastroduodenoscopy  01/08/10    Dr. [name]- normal esophagus, small hiatal hernia, antum and body erosions, pedunculated  polyp between D1 and D2, duodenal diverticulum, lymphangiectasia, second portion of the duodenum.stomach bx= inflammation, duodenum bx= adenoma  . Rotator cuff repair  08/27/11    Dr. Alphonzo Cruise- [facility]- [place] [facility]  . Esophagogastroduodenoscopy (egd) with propofol N/A 11/19/2012    RMR: non-critical Schatzki's ring s/p dilation with 72 F, multiple gastric polyps, duodenal polyp s/p piecemeal snare polypectomy, gastric polypectomy, path benign  . Maloney dilation N/A 11/19/2012    Procedure: Venia Minks DILATION;  Surgeon: Daneil Dolin, MD;  Location: [facility];  Service: Endoscopy;  Laterality: N/A;  #[id number]  . Polypectomy N/A 11/19/2012    Procedure: POLYPECTOMY;  Surgeon: Daneil Dolin, MD;  Location: [facility];  Service: Endoscopy;  Laterality: N/A;  duodenal  . Biopsy N/A 11/19/2012    Procedure: BIOPSY;  Surgeon: Daneil Dolin, MD;  Location: [facility];  Service: Endoscopy;  Laterality: N/A;  esophageal    FAMILY HISTORY Family History  Problem Relation Age of Onset  . Cancer Mother     Colon  . Cancer Father     Lung, Mouth,leg  the patient's father died at the age of 41 from metastatic head and neck carcinoma. The patient's mother died of breast cancer  the age of 57. The patient had 2 brothers, both with prostate cancer. She has 4 sisters, one diagnosed with breast cancer at the age of 43, another 1 recent underwent bilateral mastectomies. Another one died from colon cancer.   GYNECOLOGIC HISTORY:  No LMP recorded. Patient is postmenopausal. menarche age 40. She is is GX P0. She underwent menopause at age 69, status post HRT for 12 years. She is status post right salpingo-oophorectomy remotely  SOCIAL HISTORY:  She works at [employer] in [place]. Her husband died at [facility] [place] from complications of surgery after a 7-1/2 month hospitalization (2008). She lives by herself, with no pets. She tells me she works 9 hours a day 5 days a week    HEALTH MAINTENANCE: Social History  Substance  Use Topics  . Smoking status: Former Smoker    Quit date: 11/22/1982  . Smokeless tobacco: Never Used     Comment: Quit smoking x 32 years  . Alcohol Use: No     Allergies  Allergen Reactions  . Aspirin Nausea And Vomiting  . Boost Pudding [Nutritional Supplements] Diarrhea  . Percodan [Oxycodone-Aspirin] Nausea Only  . Codeine Nausea Only    Current Facility-Administered Medications  Medication Dose Route Frequency Provider Last Rate Last Dose  . ceFEPIme (MAXIPIME) 1 g in dextrose 5 % 50 mL IVPB  1 g Intravenous Q12H Minda Ditto, RPH   1 g at 09/18/15 0544  . dexamethasone (DECADRON) injection 4 mg  4 mg Intravenous Q12H Jonetta Osgood, MD   4 mg at 09/18/15 0900  . feeding supplement (BOOST / RESOURCE BREEZE) liquid 1 Container  1 Container Oral BID BM Jonetta Osgood, MD   1 Container at 09/18/15 1000  . fentaNYL (DURAGESIC - dosed mcg/hr) patch 25 mcg  25 mcg Transdermal Q72H Jonetta Osgood, MD   25 mcg at 09/16/15 0948  . gi cocktail (Maalox,Lidocaine,Donnatal)  30 mL Oral TID PRN Jonetta Osgood, MD   30 mL at 09/17/15 1018  . HYDROmorphone (DILAUDID) injection 1 mg  1 mg Intravenous Q2H PRN Jonetta Osgood, MD   1 mg at 09/17/15 2258  . lidocaine (LIDODERM) 5 % 1 patch  1 patch Transdermal Q24H Jonetta Osgood, MD   1 patch at 09/17/15 1000  . magic mouthwash w/lidocaine  10 mL Oral TID PRN Jonetta Osgood, MD   10 mL at 09/15/15 1002  . morphine (MSIR) tablet 15 mg  15 mg Oral Q4H PRN Jonetta Osgood, MD   15 mg at 09/18/15 0900  . ondansetron (ZOFRAN) injection 4 mg  4 mg Intravenous Q6H PRN Loistine Chance, MD      . pantoprazole (PROTONIX) EC tablet 40 mg  40 mg Oral Daily Jonetta Osgood, MD   40 mg at 09/18/15 0900  . polyvinyl alcohol (LIQUIFILM TEARS) 1.4 % ophthalmic solution 1 drop  1 drop Both Eyes Daily PRN Shanker Kristeen Mans, MD      . senna-docusate (Senokot-S) tablet 2 tablet  2 tablet Oral QHS PRN Loistine Chance, MD      . warfarin (COUMADIN) tablet  2 mg  2 mg Oral ONCE-1800 Anh P Pham, RPH      . Warfarin - Pharmacist Dosing Inpatient   Does not apply q1800 Minda Ditto, RPH   0  at 09/15/15 1800    OBJECTIVE: Middle-aged white woman examined in bed Filed Vitals:   09/17/15 2101 09/18/15 0521  BP: 121/70 134/68  Pulse: 85 75  Temp: 98 F (36.7 C) 98 F (36.7 C)  Resp: 20 20     Body mass index is 24.05 kg/(m^2).     Ocular: Sclerae unicteric, EOMs intact Lungs no rales or rhonchi, auscultated anterolaterally Heart regular rate and rhythm Abd soft, nontender, positive bowel sounds MSK no lower extremity lymphedema Neuro: non-focal, well-oriented, appropriate affect Breasts: Deferred   LAB RESULTS:  CMP     Component Value Date/Time   NA 139 09/17/2015 0530   NA 142 09/06/2015 1027   K 3.8 09/17/2015 0530   K 4.3 09/06/2015 1027   CL 100* 09/17/2015 0530   CL 110* 09/02/2012 0814   CO2 28 09/17/2015 0530   CO2 28 09/06/2015 1027   GLUCOSE 149* 09/17/2015 0530   GLUCOSE 128 09/06/2015 1027   GLUCOSE 105* 09/02/2012 0814   BUN 26* 09/17/2015 0530   BUN 35.0* 09/06/2015 1027   CREATININE 0.64 09/17/2015 0530   CREATININE 0.9 09/06/2015 1027   CALCIUM 9.0 09/17/2015 0530   CALCIUM 9.0 09/06/2015 1027   PROT 4.9* 09/16/2015 0532   PROT 6.4 09/06/2015 1027   ALBUMIN 1.9* 09/16/2015 0532   ALBUMIN 2.8* 09/06/2015 1027   AST 38 09/16/2015 0532   AST 16 09/06/2015 1027   ALT 54 09/16/2015 0532   ALT 23 09/06/2015 1027   ALKPHOS 142* 09/16/2015 0532   ALKPHOS 161* 09/06/2015 1027   BILITOT 0.8 09/16/2015 0532   BILITOT 0.54 09/06/2015 1027   GFRNONAA >60 09/17/2015 0530   GFRAA >60 09/17/2015 0530    INo results found for: SPEP, UPEP  Lab Results  Component Value Date   WBC 7.4 09/17/2015   NEUTROABS 6.3 09/06/2015   HGB 9.1* 09/17/2015   HCT 27.6* 09/17/2015   MCV 103.0* 09/17/2015   PLT 148* 09/17/2015    @LASTCHEMISTRY @  Lab Results  Component Value Date   LABCA2 42* 06/21/2014    No  components found for: LABCA125   Recent Labs Lab 09/18/15 0508  INR 2.17*    Urinalysis    Component Value Date/Time   COLORURINE YELLOW 09/14/2015 1834   APPEARANCEUR CLOUDY* 09/14/2015 1834   LABSPEC 1.013 09/14/2015 1834   PHURINE 5.5 09/14/2015 1834   GLUCOSEU NEGATIVE 09/14/2015 1834   HGBUR LARGE* 09/14/2015 1834   BILIRUBINUR NEGATIVE 09/14/2015 1834   KETONESUR NEGATIVE 09/14/2015 1834   PROTEINUR 30* 09/14/2015 1834   UROBILINOGEN 0.2 01/23/2015 1830   NITRITE POSITIVE* 09/14/2015 1834   LEUKOCYTESUR NEGATIVE 09/14/2015 1834    STUDIES: Mr Jeri Cos Wo Contrast  17-Sep-2015  CLINICAL DATA:  Ongoing headache. Arm pain and pain beneath the sternum and ribs for 2 weeks. Right leg pain. Metastatic breast cancer. EXAM: MRI HEAD WITHOUT AND WITH CONTRAST; MRI CERVICAL SPINE WITHOUT AND WITH CONTRAST; MRI LUMBAR SPINE WITHOUT AND WITH CONTRAST; MRI THORACIC SPINE WITHOUT AND WITH CONTRAST TECHNIQUE: Multiplanar, multiecho pulse sequences of the brain and surrounding structures were obtained according to standard protocol without and with intravenous contrast; Multiplanar and multiecho pulse sequences of the cervical spine, to include the craniocervical junction and cervicothoracic junction, were obtained according to standard protocol without and with intravenous contrast.; Multiplanar and multiecho pulse sequences of the lumbar spine were obtained without and with intravenous contrast.; Multiplanar and multiecho pulse sequences of the thoracic spine were obtained without and with intravenous contrast. CONTRAST:  98m MULTIHANCE GADOBENATE DIMEGLUMINE 529 MG/ML IV SOLN COMPARISON:  Head CT 08/05/2015 and MRI 02/14/2009. Cervical spine MRI 05/14/2013. Thoracic and lumbar spine MRI  07/05/2015. FINDINGS: MR HEAD FINDINGS: There is no evidence of acute infarct, intracranial hemorrhage, mass, midline shift, or extra-axial fluid collection. There is mild cerebral atrophy. Small foci of T2  hyperintensity in the cerebral white matter bilaterally, stable to slightly increased from 2010 and nonspecific but compatible with mild-to-moderate chronic small vessel ischemic disease. No enhancing brain lesions are identified. There is at most minimally prominent dural enhancement without nodularity. Small enhancing lesions are present throughout the skull, progressive from 2010 (for example 3 cm left parietal lesion on image 28 of series 41). There is involvement of the clivus. Prior bilateral cataract extraction is noted. There is a trace left mastoid effusion. No significant paranasal sinus inflammatory disease. Major intracranial vascular flow voids are preserved. MR CERVICAL SPINE FINDINGS: Cervical spine straightening is unchanged. There is no significant listhesis. There is progressive osseous metastatic disease throughout the cervical spine since the 2015 MRI. There is now complete marrow replacement involving the C4 vertebral body with patchy enhancement including a 1 cm focus posteriorly. There is also a left-sided posterior element involvement at C4 which is new. No epidural tumor or spinal cord compression is identified. No cervical spinal cord signal abnormality is identified within limitations of motion artifact. Diffuse cervical disc degeneration is again seen. There is mild neural foraminal stenosis on the right at C3-4, on the left at C4-5, and likely bilaterally at C5-6 and C6-7. This is overall similar to the prior MRI. No significant spinal stenosis is seen. The paraspinal soft tissues are unremarkable. MR THORACIC SPINE FINDINGS: Diffuse osseous metastatic disease is again seen throughout the vertebral bodies and posterior elements of the thoracic spine. There has overall been mild progression from the 07/05/2015 thoracic spine MRI. There is now near complete involvement of the T10 vertebral body. There is mildly progressive marrow replacement involving multiple mid thoracic vertebral bodies  as well as T1 and T2. The T3 vertebral body remains completely involved. There is a mild T12 superior endplate compression fracture which demonstrates slightly progressive height loss compared to the prior study. There is slightly prominent ventral epidural enhancement in the mid thoracic spine from T5 -T8, most conspicuous and mildly nodular in a left paracentral location at the T8 superior endplate level (series 37, image 33). This may reflect very small volume epidural tumor, prominent epidural venous plexus, or a combination of both. This has likely not significantly changed from the prior thoracic spine MRI, although there is much more motion artifact on axial sequences on that study. The spinal cord is normal in caliber and signal. There is no spinal cord compression. Multilevel disc degeneration is again seen with disc bulging and small disc protrusions without significant spinal canal or neural foraminal stenosis. Small left adrenal nodule and mild left upper pole renal caliectasis or cysts are partially visualized, grossly similar to the 09/07/2014 PET-CT. MR LUMBAR SPINE FINDINGS: Vertebral alignment is unchanged, with trace anterolisthesis again seen of L3 on L4. Osseous lesions throughout the lumbar spine do not appear significantly changed from the prior lumbar spine MRI, with the largest lesion in the L1 vertebral body. S3 level sacral osseous metastatic disease is partially visualized and grossly similar to the prior study. 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 Lumbar vertebral body heights are preserved. There is diffuse lumbar disc desiccation. Severe disc space height loss at L4-5 is unchanged. Multilevel disc and facet degeneration does not appear significantly changed, with mild right foraminal stenosis again seen at L3-4. There is no spinal stenosis. No epidural tumor  is identified. Conus medullaris is normal in signal and terminates at L1. IMPRESSION: 1. No acute intracranial  abnormality. No evidence of brain metastases. 2. Numerous osseous skull metastases, progressed from 2010. 3. Widespread spinal osseous metastatic disease, with interval progression in the cervical and thoracic spine. No sizable epidural tumor identified. Slightly prominent ventral epidural enhancement in the mid thoracic spine is favored to mostly reflect epidural venous plexus, however trace epidural tumor is possible particularly at T8. 4. No spinal stenosis. 5. Mild T12 superior endplate compression fracture with slightly progressive height loss from 07/05/2015. Electronically Signed   By: Logan Bores M.D.   On: 08/25/2015 17:08   Mr Cervical Spine W Wo Contrast  08/25/2015  CLINICAL DATA:  Ongoing headache. Arm pain and pain beneath the sternum and ribs for 2 weeks. Right leg pain. Metastatic breast cancer. EXAM: MRI HEAD WITHOUT AND WITH CONTRAST; MRI CERVICAL SPINE WITHOUT AND WITH CONTRAST; MRI LUMBAR SPINE WITHOUT AND WITH CONTRAST; MRI THORACIC SPINE WITHOUT AND WITH CONTRAST TECHNIQUE: Multiplanar, multiecho pulse sequences of the brain and surrounding structures were obtained according to standard protocol without and with intravenous contrast; Multiplanar and multiecho pulse sequences of the cervical spine, to include the craniocervical junction and cervicothoracic junction, were obtained according to standard protocol without and with intravenous contrast.; Multiplanar and multiecho pulse sequences of the lumbar spine were obtained without and with intravenous contrast.; Multiplanar and multiecho pulse sequences of the thoracic spine were obtained without and with intravenous contrast. CONTRAST:  45m MULTIHANCE GADOBENATE DIMEGLUMINE 529 MG/ML IV SOLN COMPARISON:  Head CT 08/05/2015 and MRI 02/14/2009. Cervical spine MRI 05/14/2013. Thoracic and lumbar spine MRI 07/05/2015. FINDINGS: MR HEAD FINDINGS: There is no evidence of acute infarct, intracranial hemorrhage, mass, midline shift, or  extra-axial fluid collection. There is mild cerebral atrophy. Small foci of T2 hyperintensity in the cerebral white matter bilaterally, stable to slightly increased from 2010 and nonspecific but compatible with mild-to-moderate chronic small vessel ischemic disease. No enhancing brain lesions are identified. There is at most minimally prominent dural enhancement without nodularity. Small enhancing lesions are present throughout the skull, progressive from 2010 (for example 3 cm left parietal lesion on image 28 of series 41). There is involvement of the clivus. Prior bilateral cataract extraction is noted. There is a trace left mastoid effusion. No significant paranasal sinus inflammatory disease. Major intracranial vascular flow voids are preserved. MR CERVICAL SPINE FINDINGS: Cervical spine straightening is unchanged. There is no significant listhesis. There is progressive osseous metastatic disease throughout the cervical spine since the 2015 MRI. There is now complete marrow replacement involving the C4 vertebral body with patchy enhancement including a 1 cm focus posteriorly. There is also a left-sided posterior element involvement at C4 which is new. No epidural tumor or spinal cord compression is identified. No cervical spinal cord signal abnormality is identified within limitations of motion artifact. Diffuse cervical disc degeneration is again seen. There is mild neural foraminal stenosis on the right at C3-4, on the left at C4-5, and likely bilaterally at C5-6 and C6-7. This is overall similar to the prior MRI. No significant spinal stenosis is seen. The paraspinal soft tissues are unremarkable. MR THORACIC SPINE FINDINGS: Diffuse osseous metastatic disease is again seen throughout the vertebral bodies and posterior elements of the thoracic spine. There has overall been mild progression from the 07/05/2015 thoracic spine MRI. There is now near complete involvement of the T10 vertebral body. There is mildly  progressive marrow replacement involving multiple mid thoracic vertebral bodies  as well as T1 and T2. The T3 vertebral body remains completely involved. There is a mild T12 superior endplate compression fracture which demonstrates slightly progressive height loss compared to the prior study. There is slightly prominent ventral epidural enhancement in the mid thoracic spine from T5 -T8, most conspicuous and mildly nodular in a left paracentral location at the T8 superior endplate level (series 37, image 33). This may reflect very small volume epidural tumor, prominent epidural venous plexus, or a combination of both. This has likely not significantly changed from the prior thoracic spine MRI, although there is much more motion artifact on axial sequences on that study. The spinal cord is normal in caliber and signal. There is no spinal cord compression. Multilevel disc degeneration is again seen with disc bulging and small disc protrusions without significant spinal canal or neural foraminal stenosis. Small left adrenal nodule and mild left upper pole renal caliectasis or cysts are partially visualized, grossly similar to the 09/07/2014 PET-CT. MR LUMBAR SPINE FINDINGS: Vertebral alignment is unchanged, with trace anterolisthesis again seen of L3 on L4. Osseous lesions throughout the lumbar spine do not appear significantly changed from the prior lumbar spine MRI, with the largest lesion in the L1 vertebral body. S3 level sacral osseous metastatic disease is partially visualized and grossly similar to the prior study. 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 Lumbar vertebral body heights are preserved. There is diffuse lumbar disc desiccation. Severe disc space height loss at L4-5 is unchanged. Multilevel disc and facet degeneration does not appear significantly changed, with mild right foraminal stenosis again seen at L3-4. There is no spinal stenosis. No epidural tumor is identified. Conus medullaris is  normal in signal and terminates at L1. IMPRESSION: 1. No acute intracranial abnormality. No evidence of brain metastases. 2. Numerous osseous skull metastases, progressed from 2010. 3. Widespread spinal osseous metastatic disease, with interval progression in the cervical and thoracic spine. No sizable epidural tumor identified. Slightly prominent ventral epidural enhancement in the mid thoracic spine is favored to mostly reflect epidural venous plexus, however trace epidural tumor is possible particularly at T8. 4. No spinal stenosis. 5. Mild T12 superior endplate compression fracture with slightly progressive height loss from 07/05/2015. Electronically Signed   By: Logan Bores M.D.   On: 08/25/2015 17:08   Mr Thoracic Spine W Wo Contrast  08/25/2015  CLINICAL DATA:  Ongoing headache. Arm pain and pain beneath the sternum and ribs for 2 weeks. Right leg pain. Metastatic breast cancer. EXAM: MRI HEAD WITHOUT AND WITH CONTRAST; MRI CERVICAL SPINE WITHOUT AND WITH CONTRAST; MRI LUMBAR SPINE WITHOUT AND WITH CONTRAST; MRI THORACIC SPINE WITHOUT AND WITH CONTRAST TECHNIQUE: Multiplanar, multiecho pulse sequences of the brain and surrounding structures were obtained according to standard protocol without and with intravenous contrast; Multiplanar and multiecho pulse sequences of the cervical spine, to include the craniocervical junction and cervicothoracic junction, were obtained according to standard protocol without and with intravenous contrast.; Multiplanar and multiecho pulse sequences of the lumbar spine were obtained without and with intravenous contrast.; Multiplanar and multiecho pulse sequences of the thoracic spine were obtained without and with intravenous contrast. CONTRAST:  60m MULTIHANCE GADOBENATE DIMEGLUMINE 529 MG/ML IV SOLN COMPARISON:  Head CT 08/05/2015 and MRI 02/14/2009. Cervical spine MRI 05/14/2013. Thoracic and lumbar spine MRI 07/05/2015. FINDINGS: MR HEAD FINDINGS: There is no evidence  of acute infarct, intracranial hemorrhage, mass, midline shift, or extra-axial fluid collection. There is mild cerebral atrophy. Small foci of T2 hyperintensity in the cerebral white  matter bilaterally, stable to slightly increased from 2010 and nonspecific but compatible with mild-to-moderate chronic small vessel ischemic disease. No enhancing brain lesions are identified. There is at most minimally prominent dural enhancement without nodularity. Small enhancing lesions are present throughout the skull, progressive from 2010 (for example 3 cm left parietal lesion on image 28 of series 41). There is involvement of the clivus. Prior bilateral cataract extraction is noted. There is a trace left mastoid effusion. No significant paranasal sinus inflammatory disease. Major intracranial vascular flow voids are preserved. MR CERVICAL SPINE FINDINGS: Cervical spine straightening is unchanged. There is no significant listhesis. There is progressive osseous metastatic disease throughout the cervical spine since the 2015 MRI. There is now complete marrow replacement involving the C4 vertebral body with patchy enhancement including a 1 cm focus posteriorly. There is also a left-sided posterior element involvement at C4 which is new. No epidural tumor or spinal cord compression is identified. No cervical spinal cord signal abnormality is identified within limitations of motion artifact. Diffuse cervical disc degeneration is again seen. There is mild neural foraminal stenosis on the right at C3-4, on the left at C4-5, and likely bilaterally at C5-6 and C6-7. This is overall similar to the prior MRI. No significant spinal stenosis is seen. The paraspinal soft tissues are unremarkable. MR THORACIC SPINE FINDINGS: Diffuse osseous metastatic disease is again seen throughout the vertebral bodies and posterior elements of the thoracic spine. There has overall been mild progression from the 07/05/2015 thoracic spine MRI. There is now  near complete involvement of the T10 vertebral body. There is mildly progressive marrow replacement involving multiple mid thoracic vertebral bodies as well as T1 and T2. The T3 vertebral body remains completely involved. There is a mild T12 superior endplate compression fracture which demonstrates slightly progressive height loss compared to the prior study. There is slightly prominent ventral epidural enhancement in the mid thoracic spine from T5 -T8, most conspicuous and mildly nodular in a left paracentral location at the T8 superior endplate level (series 37, image 33). This may reflect very small volume epidural tumor, prominent epidural venous plexus, or a combination of both. This has likely not significantly changed from the prior thoracic spine MRI, although there is much more motion artifact on axial sequences on that study. The spinal cord is normal in caliber and signal. There is no spinal cord compression. Multilevel disc degeneration is again seen with disc bulging and small disc protrusions without significant spinal canal or neural foraminal stenosis. Small left adrenal nodule and mild left upper pole renal caliectasis or cysts are partially visualized, grossly similar to the 09/07/2014 PET-CT. MR LUMBAR SPINE FINDINGS: Vertebral alignment is unchanged, with trace anterolisthesis again seen of L3 on L4. Osseous lesions throughout the lumbar spine do not appear significantly changed from the prior lumbar spine MRI, with the largest lesion in the L1 vertebral body. S3 level sacral osseous metastatic disease is partially visualized and grossly similar to the prior study. 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 Lumbar vertebral body heights are preserved. There is diffuse lumbar disc desiccation. Severe disc space height loss at L4-5 is unchanged. Multilevel disc and facet degeneration does not appear significantly changed, with mild right foraminal stenosis again seen at L3-4. There is no  spinal stenosis. No epidural tumor is identified. Conus medullaris is normal in signal and terminates at L1. IMPRESSION: 1. No acute intracranial abnormality. No evidence of brain metastases. 2. Numerous osseous skull metastases, progressed from 2010. 3. Widespread spinal osseous  metastatic disease, with interval progression in the cervical and thoracic spine. No sizable epidural tumor identified. Slightly prominent ventral epidural enhancement in the mid thoracic spine is favored to mostly reflect epidural venous plexus, however trace epidural tumor is possible particularly at T8. 4. No spinal stenosis. 5. Mild T12 superior endplate compression fracture with slightly progressive height loss from 07/05/2015. Electronically Signed   By: Logan Bores M.D.   On: 08/25/2015 17:08   Mr Lumbar Spine W Wo Contrast  08/25/2015  CLINICAL DATA:  Ongoing headache. Arm pain and pain beneath the sternum and ribs for 2 weeks. Right leg pain. Metastatic breast cancer. EXAM: MRI HEAD WITHOUT AND WITH CONTRAST; MRI CERVICAL SPINE WITHOUT AND WITH CONTRAST; MRI LUMBAR SPINE WITHOUT AND WITH CONTRAST; MRI THORACIC SPINE WITHOUT AND WITH CONTRAST TECHNIQUE: Multiplanar, multiecho pulse sequences of the brain and surrounding structures were obtained according to standard protocol without and with intravenous contrast; Multiplanar and multiecho pulse sequences of the cervical spine, to include the craniocervical junction and cervicothoracic junction, were obtained according to standard protocol without and with intravenous contrast.; Multiplanar and multiecho pulse sequences of the lumbar spine were obtained without and with intravenous contrast.; Multiplanar and multiecho pulse sequences of the thoracic spine were obtained without and with intravenous contrast. CONTRAST:  36m MULTIHANCE GADOBENATE DIMEGLUMINE 529 MG/ML IV SOLN COMPARISON:  Head CT 08/05/2015 and MRI 02/14/2009. Cervical spine MRI 05/14/2013. Thoracic and lumbar  spine MRI 07/05/2015. FINDINGS: MR HEAD FINDINGS: There is no evidence of acute infarct, intracranial hemorrhage, mass, midline shift, or extra-axial fluid collection. There is mild cerebral atrophy. Small foci of T2 hyperintensity in the cerebral white matter bilaterally, stable to slightly increased from 2010 and nonspecific but compatible with mild-to-moderate chronic small vessel ischemic disease. No enhancing brain lesions are identified. There is at most minimally prominent dural enhancement without nodularity. Small enhancing lesions are present throughout the skull, progressive from 2010 (for example 3 cm left parietal lesion on image 28 of series 41). There is involvement of the clivus. Prior bilateral cataract extraction is noted. There is a trace left mastoid effusion. No significant paranasal sinus inflammatory disease. Major intracranial vascular flow voids are preserved. MR CERVICAL SPINE FINDINGS: Cervical spine straightening is unchanged. There is no significant listhesis. There is progressive osseous metastatic disease throughout the cervical spine since the 2015 MRI. There is now complete marrow replacement involving the C4 vertebral body with patchy enhancement including a 1 cm focus posteriorly. There is also a left-sided posterior element involvement at C4 which is new. No epidural tumor or spinal cord compression is identified. No cervical spinal cord signal abnormality is identified within limitations of motion artifact. Diffuse cervical disc degeneration is again seen. There is mild neural foraminal stenosis on the right at C3-4, on the left at C4-5, and likely bilaterally at C5-6 and C6-7. This is overall similar to the prior MRI. No significant spinal stenosis is seen. The paraspinal soft tissues are unremarkable. MR THORACIC SPINE FINDINGS: Diffuse osseous metastatic disease is again seen throughout the vertebral bodies and posterior elements of the thoracic spine. There has overall been  mild progression from the 07/05/2015 thoracic spine MRI. There is now near complete involvement of the T10 vertebral body. There is mildly progressive marrow replacement involving multiple mid thoracic vertebral bodies as well as T1 and T2. The T3 vertebral body remains completely involved. There is a mild T12 superior endplate compression fracture which demonstrates slightly progressive height loss compared to the prior study. There is slightly  prominent ventral epidural enhancement in the mid thoracic spine from T5 -T8, most conspicuous and mildly nodular in a left paracentral location at the T8 superior endplate level (series 37, image 33). This may reflect very small volume epidural tumor, prominent epidural venous plexus, or a combination of both. This has likely not significantly changed from the prior thoracic spine MRI, although there is much more motion artifact on axial sequences on that study. The spinal cord is normal in caliber and signal. There is no spinal cord compression. Multilevel disc degeneration is again seen with disc bulging and small disc protrusions without significant spinal canal or neural foraminal stenosis. Small left adrenal nodule and mild left upper pole renal caliectasis or cysts are partially visualized, grossly similar to the 09/07/2014 PET-CT. MR LUMBAR SPINE FINDINGS: Vertebral alignment is unchanged, with trace anterolisthesis again seen of L3 on L4. Osseous lesions throughout the lumbar spine do not appear significantly changed from the prior lumbar spine MRI, with the largest lesion in the L1 vertebral body. S3 level sacral osseous metastatic disease is partially visualized and grossly similar to the prior study. 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 Lumbar vertebral body heights are preserved. There is diffuse lumbar disc desiccation. Severe disc space height loss at L4-5 is unchanged. Multilevel disc and facet degeneration does not appear significantly  changed, with mild right foraminal stenosis again seen at L3-4. There is no spinal stenosis. No epidural tumor is identified. Conus medullaris is normal in signal and terminates at L1. IMPRESSION: 1. No acute intracranial abnormality. No evidence of brain metastases. 2. Numerous osseous skull metastases, progressed from 2010. 3. Widespread spinal osseous metastatic disease, with interval progression in the cervical and thoracic spine. No sizable epidural tumor identified. Slightly prominent ventral epidural enhancement in the mid thoracic spine is favored to mostly reflect epidural venous plexus, however trace epidural tumor is possible particularly at T8. 4. No spinal stenosis. 5. Mild T12 superior endplate compression fracture with slightly progressive height loss from 07/05/2015. Electronically Signed   By: Logan Bores M.D.   On: 08/25/2015 17:08   Nm Pet Image Restag (ps) Skull Base To Thigh  08/31/2015  CLINICAL DATA:  Subsequent treatment strategy for metastatic breast carcinoma to bone. EXAM: NUCLEAR MEDICINE PET SKULL BASE TO THIGH TECHNIQUE: 6.3 mCi F-18 FDG was injected intravenously. Full-ring PET imaging was performed from the skull base to thigh after the radiotracer. CT data was obtained and used for attenuation correction and anatomic localization. FASTING BLOOD GLUCOSE:  Value: 103 mg/dl COMPARISON:  09/07/2014 FINDINGS: NECK No hypermetabolic lymph nodes in the neck. CHEST No hypermetabolic mediastinal or hilar nodes. No suspicious pulmonary nodules on the CT scan. Biapical pleural- parenchymal scarring appears stable. Previously seen area of hypermetabolic airspace disease in the left upper lobe has nearly completely resolved, consistent with resolving infectious or inflammatory process. ABDOMEN/PELVIS No abnormal hypermetabolic activity within the liver, pancreas, adrenal glands, or spleen. No hypermetabolic lymph nodes in the abdomen or pelvis. Tiny left hepatic lobe cyst remains stable. Prior  cholecystectomy noted. 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 11 mm left adrenal nodule remains stable and shows no metabolic activity, consistent with benign adrenal adenoma. Colonic diverticulosis noted, without evidence of diverticulitis. SKELETON Mixed lytic and sclerotic bone metastases again seen within spine, pelvis, sternum, and proximal left femur. These appear stable and show no significant change in low-grade metabolic activity, consistent with largely treated osseous metastatic disease. IMPRESSION: Near complete resolution of hypermetabolic airspace disease in left upper lobe, consistent with  resolving infectious or inflammatory process. No significant change in appearance of largely treated osseous metastatic disease. No new or progressive metastatic disease identified. Electronically Signed   By: Earle Gell M.D.   On: 08/31/2015 12:48   Dg Chest Port 1 View  09/15/2015  CLINICAL DATA:  Short breath, cough, fever EXAM: PORTABLE CHEST 1 VIEW COMPARISON:  08/31/2015 FINDINGS: Normal cardiac silhouette with ectatic aorta. Increased density in the LEFT upper lobe. The biapical densities are not changed from CT. Small LEFT effusion. No pneumothorax. IMPRESSION: 1. Increasing density in the LEFT upper lobe. Concerning for edema or infection. Electronically Signed   By: Suzy Bouchard M.D.   On: 09/15/2015 07:26   US Abdomen Limited Ruq  09/15/2015  CLINICAL DATA:  Elevated liver function tests. EXAM: US ABDOMEN LIMITED - RIGHT UPPER QUADRANT COMPARISON:  08/31/2015, PET-CT. FINDINGS: Gallbladder: Surgically absent Common bile duct: Diameter: Dilated to a maximum of 17 mm, unchanged from the recent prior PET-CT. No sonographic evidence of a duct stone. Liver: Cyst in the left liver lobe measuring 13 somewhat mottled appearance of the liver in its more posterior aspect, which could be technique. The possibility of numerous small infiltrating lesion should be considered in the patient's clinical  setting. Liver is normal in size and overall echogenicity. IMPRESSION: 1. Chronically dilated common bile duct. Status post cholecystectomy. 2. Mottled appearance of the posterior liver. Consider followup liver MRI with and without contrast. Electronically Signed   By: Lajean Manes M.D.   On: 09/15/2015 10:04    ASSESSMENT: 70 y.o. [place], [place] woman status post right lumpectomy and axillary lymph node dissection December 2006 for a TX N2, stage IIIA invasive ductal carcinoma, estrogen and progesterone receptor positive, with subsequent stage IV disease  (1) received adjuvant doxorubicin, docetaxel and cyclophosphamide  (2) status post adjuvant radiation  (3) on tamoxifen to September 2011 when she was noted to have metastatic disease to bone  (4) fulvestrant 02/06/2009 to 01/15/2011  (5) left axillary lymph node positive for an invasive ductal carcinoma, estrogen receptor and HER-2 negative November 2011, and a right breast recurrence for an estrogen receptor positive lesion at the same time.  (6) status post bilateral mastectomies 06/28/2010  (7) status post left chest and axillary radiation  (8) status post right chest wall recurrence October of 2012, treated with electron beam radiation completed December 2012  (9) progression in bone August 2013,  (10) status post radiation to the lumbosacral spine completed October 2013   (11) capecitabine November 2013 through January 2014  (12) tamoxifen/ everolimus February 2014, discontinued November 2014 with progression  (13) Megace 80 mg twice daily with progression January 2015  (14) Radiation to lumbar spine February 2015  (15) letrozole plus Ibrance started 07/08/2013, discontinued May 2017 with progression  (16). Declines genetic testing (as of 08/17/2013)  (17) zolendronate 05/01/2009, discontinued because of pain; Xgeva started 10/08/2013, discontinued because of cost issues as of November 2015, switched to alendronate March  2016, switched back to [place] but stopped after the May 2016 dose because of side effects, on ibandronate [125 mg/ day 21 days on, 7 days off] temporarily, denosumab to be resumed 10/02/2015  (18) cyclophosphamide, methotrexate, and fluorouracil to be started 10/02/2015, repeated every 21 days.  OTHER ACTIVE PROBLEMS:  (1) chronic GI complaints, followed by Dr. Sydell Axon (a) treated for c difficile colitis Sept 2016 (stool Ag positive, toxin negative 01/23/2015), received oral vancomycin started 01/24/2015, stopped 02/07/2015  (2) multiple orthopedic problems, including bilateral chronic foot pain, history  of rotator cuff repair  (3) history of extensive superficial phlebitis, on chronic low dose Coumadin (no history of DVT or PE).  (4) LUL cavitary pneumonia diagnosed as MRSA while at [facility] April 2016, treated with Zosyn x 21 days  (5) left upper lobe pneumonia again diagnosed May 2017  (6) poorly controlled pain  PLAN: As far as her breast cancer is concerned, [name] still does not have visceral disease and so her prognosis is relatively good. We are going to start CMF chemotherapy (Cytoxan, methotrexate, lower your SL) on June 5.  Her pneumonia is being treated appropriately and I believe problems that brought her to the hospital are partly due to the pneumonia and partly due to failure to thrive and dehydration. Unfortunately she has many friends but not someone right they're living with her who could provide her the extra level of support she would need.  It would be helpful to have a port in place before the start of chemotherapy but this is not absolutely necessary. Right now, with a recent pneumonia, I would feel more comfortable if she completes her course of antibiotics before the port being placed. We will arrange for this on an outpatient basis.  The pain issue is more complicated. She has been started on fentanyl and I think this can be a good long-acting  agent for her. We will have to make sure that her insurance will pay for it as it is expensive. For some reason she is reluctant to use morphine and prefers to use hydrocodone but which ever short term age and she uses of course she will need to have it available frequently enough to keep the pain at a 3-4 out of 10 level. This would allow her to continue to work with which is her goal.  She will be constipated from the narcotics. I have added a bowel prophylaxis regimen to her current medications  I will follow with you through her hospital course. I greatly appreciate your excellent care to this patient  Chauncey Cruel, MD   09/18/2015 9:46 AM Medical Oncology and Hematology [facility] [address] [place], [place] [address] Tel. [phone]    Fax. [phone]

## 2015-09-18 NOTE — Progress Notes (Signed)
Physical Therapy Treatment Patient Details Name: [name] MRN: [id number] DOB: May 09, 1945 Today's Date: 09/18/2015    History of Present Illness 70 yo female admitted with Pna, SIRS. Hx of breast cancer with bony mets, FTT, HTN    PT Comments    Progressing slowly with mobility. Pt reported pain in back, R LE, and L ribcage. Continue to recommend SNF.   Follow Up Recommendations  SNF     Equipment Recommendations  Rolling walker with 5" wheels    Recommendations for Other Services       Precautions / Restrictions Precautions Precautions: Fall Precaution Comments: blisters on buttocks Restrictions Weight Bearing Restrictions: No    Mobility  Bed Mobility Overal bed mobility: Needs Assistance Bed Mobility: Sidelying to Sit;Sit to Sidelying   Sidelying to sit: Min guard     Sit to sidelying: Min assist General bed mobility comments: Increased time. Some reliance on bedrail. Assist to get LEs back onto bed.   Transfers Overall transfer level: Needs assistance Equipment used: Rolling walker (2 wheeled) Transfers: Sit to/from Stand Sit to Stand: Min assist         General transfer comment: Assist to rise, stabilize, control descent.   Ambulation/Gait Ambulation/Gait assistance: Min assist Ambulation Distance (Feet): 20 Feet Assistive device: Rolling walker (2 wheeled) Gait Pattern/deviations: Step-through pattern;Decreased stride length     General Gait Details: walked in room only at pt's request. slow gait speed. unsteady at times. assist to stabilize intermittently   Stairs            Wheelchair Mobility    Modified Rankin (Stroke Patients Only)       Balance Overall balance assessment: Needs assistance         Standing balance support: During functional activity Standing balance-Leahy Scale: Poor                      Cognition Arousal/Alertness: Awake/alert Behavior During Therapy: WFL for tasks  assessed/performed Overall Cognitive Status: Within Functional Limits for tasks assessed                      Exercises      General Comments        Pertinent Vitals/Pain Pain Assessment: Faces Faces Pain Scale: Hurts even more Pain Location: R LE, back, L ribcage Pain Descriptors / Indicators: Sore;Grimacing;Aching Pain Intervention(s): Limited activity within patient's tolerance;Repositioned    Home Living                      Prior Function            PT Goals (current goals can now be found in the care plan section) Progress towards PT goals: Progressing toward goals    Frequency  Min 3X/week    PT Plan Current plan remains appropriate    Co-evaluation             End of Session Equipment Utilized During Treatment: Gait belt Activity Tolerance: Patient limited by fatigue;Patient limited by pain Patient left: in bed;with call bell/phone within reach;with bed alarm set     Time: 1040-1048 PT Time Calculation (min) (ACUTE ONLY): 8 min  Charges:  $Gait Training: 8-22 mins                    G Codes:      Weston Anna, MPT Pager: [phone]

## 2015-09-18 NOTE — Progress Notes (Signed)
PROGRESS NOTE        PATIENT DETAILS Name: [name] Age: 70 y.o. Sex: female Date of Birth: February 14, 1946 Admit Date: 09/14/2015 Admitting Physician Evalee Mutton Kristeen Mans, MD [id number] E, MD  Brief Narrative: Patient is a 70 y.o. female with history of metastatic breast cancer, admitted with healthcare associated pneumonia, generalized weakness and generalized bony pain.  Subjective: Continues to have diffuse bone pain today-although better than the past few days, mild epigastric discomfort continues. But overall looks better  Assessment/Plan: Principal Problem: SIR's secondary to HCAP (healthcare-associated pneumonia): Although clinically improved-now afebrile for >3 days.Main issues continue to be generalized weakness and diffuse pain. Blood cultures done here and at [facility] (per RN who called) are negative, as a result Vancomycin was discontinued on 5/21 and is now just on Cefepime-suspect we can narrow down to oral agents on 5/23. Urine streptococcal and legionella antigen negative. Continue to follow clinical course, will need to be mobilized by physical therapy.  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 Unfortunately, her deconditioning has only worsened due to acute illness and pneumonia. PT evaluation completed, recommendations are for SNF-patient is agreeable.  Generalized/Diffuse Pain: suspect secondary to multiple osseous mets, although still persistent she is seems more comfortable today with Decadron, Fentanyl transdermal and prn IV Dilaudid and prn MSIR.May need to be started on bisphosphonates if pain is still an issue in the next few days. Appreciate palliative care evaluation.  Mildly elevated LFTs: Now normalized-likely due to acute  infection, but given history of malignancy-liver metastases is a cause of concern. RUQ ultrasound no obvious metastatic lesions. Recent PET scan on 08/31/15 did not show any major hepatic lesions as well. For now continue to follow.  Epigastric/lower chest pain: better today-describes it as "indigestion"-continue PPI, and prn GI cocktail. EKG-no acute/new changes. Suspect no further work up required at this point-except following clinical course  Hypokalemia:repleted   Anemia: Likely secondary to chronic disease-worsened by acute illness, no evidence of blood loss. Follow CBC  HTN: Continue to hold both Coreg and lisinopril-but pressure currently controlled without antihypertensives.  History of extensive superficial venous thrombosis: Maintained on coumadin at home-continue per pharmacy  History of metastatic breast cancer: Recent PET scan on 08/31/15 showed numerous osseous mets-followed by Dr [name]. Recent MRI entire spine on 4/28 showed multiple osseous spinal mets with no cord compression. .Obviously with fever/PNA-plans for chemo and port placement will need to be postponed.Seen by Oncology on 5/22-plans are to start  cyclophosphamide, methotrexate, and fluorouracil on 10/02/2015  Anorexia: Likely secondary to  underlying malignancy worsened by pneumonia. Continue IV Abx, if no improvement, will start Marinol  Protein-calorie malnutrition, severe: Continue supplements, await nutrition evaluation.  DVT Prophylaxis: Full dose anticoagulation on Coumadin  Code Status: Full code   Family Communication: None at bedside  Disposition Plan: Remain inpatient-SNF on discharge-probably 5/23  Antimicrobial agents: IV Vancomycin 5/18>>5/21 IV Cefepime 5/18>>  Procedures: Echo 5/19>>EF 50-55%  CONSULTS:  None  Time spent: 25 minutes-Greater than 50% of this time was spent in counseling, explanation of diagnosis, planning of further management, and coordination of  care.  MEDICATIONS: Anti-infectives    Start     Dose/Rate Route Frequency Ordered Stop   09/15/15 0800  vancomycin (VANCOCIN) 500 mg in sodium chloride 0.9 % 100 mL IVPB  Status:  Discontinued     500 mg 100 mL/hr over 60 Minutes Intravenous Every 12 hours 09/14/15 1838 09/17/15 1106   09/15/15 0600  ceFEPIme (MAXIPIME) 1 g in dextrose 5 % 50 mL IVPB     1 g 100 mL/hr over 30 Minutes Intravenous Every 12 hours 09/14/15 1819 09/22/15 1759   09/14/15 2000  vancomycin (VANCOCIN) 1,250 mg in sodium chloride 0.9 % 250 mL IVPB     1,250 mg 166.7 mL/hr over 90 Minutes Intravenous  Once 09/14/15 1836 09/15/15 0026   09/14/15 1845  ceFEPIme (MAXIPIME) 2 g in dextrose 5 % 50 mL IVPB     2 g 100 mL/hr over 30 Minutes Intravenous  Once 09/14/15 1831 09/14/15 1918      Scheduled Meds: . ceFEPime (MAXIPIME) IV  1 g Intravenous Q12H  . dexamethasone  2 mg Oral Q12H  . docusate sodium  100 mg Oral Daily  . feeding supplement  1 Container Oral BID BM  . fentaNYL  25 mcg Transdermal Q72H  . lidocaine  1 patch Transdermal Q24H  . pantoprazole  40 mg Oral Daily  . polyethylene glycol  17 g Oral Daily  . warfarin  2 mg Oral ONCE-1800  . Warfarin - Pharmacist Dosing Inpatient   Does not apply q1800   Continuous Infusions:  PRN Meds:.gi cocktail, HYDROmorphone (DILAUDID) injection, magic mouthwash w/lidocaine, morphine, ondansetron (ZOFRAN) IV, polyvinyl alcohol, senna-docusate   PHYSICAL EXAM: Vital signs: Filed Vitals:   09/17/15 0700 09/17/15 1500 09/17/15 2101 09/18/15 0521  BP: 125/74 114/72 121/70 134/68  Pulse:  80 85 75  Temp:  98.1 F (36.7 C) 98 F (36.7 C) 98 F (36.7 C)  TempSrc:  Oral Oral Oral  Resp:  20 20 20   Height:      Weight:      SpO2:  94% 91% 91%   Filed Weights   09/14/15 1730  Weight: 59.648 kg (131 lb 8 oz)   Body mass index is 24.05 kg/(m^2).   Gen Exam: Awake and alert with clear speech.Chronically sick looking. Neck: Supple, No JVD.   Chest: B/L  Clear.   CVS: S1 S2 Regular, no murmurs.  Abdomen: soft, BS +, mildly tender in epigastric area- non distended.  Extremities: no edema, lower extremities warm to touch. Neurologic: Non Focal-but exam limited due to pain. Skin: No Rash or lesions   Wounds: N/A.    LABORATORY DATA: CBC:  Recent Labs Lab 09/15/15 0521 09/17/15 0530  WBC 5.9 7.4  HGB 8.7* 9.1*  HCT 26.7* 27.6*  MCV 102.7* 103.0*  PLT 112* 148*    Basic Metabolic Panel:  Recent Labs Lab 09/15/15 0521 09/16/15 0532 09/17/15 0530  NA 141 138 139  K 3.3* 3.1* 3.8  CL 109 103 100*  CO2 24 27 28   GLUCOSE 75 101* 149*  BUN 25* 18 26*  CREATININE 0.76 0.62 0.64  CALCIUM 7.8* 8.6* 9.0    GFR: Estimated Creatinine Clearance: 51 mL/min (by C-G formula based on Cr of 0.64).  Liver Function Tests:  Recent Labs Lab 09/15/15 0521 09/16/15 0532  AST 70* 38  ALT 69* 54  ALKPHOS 153* 142*  BILITOT 1.4* 0.8  PROT 5.3* 4.9*  ALBUMIN 2.1* 1.9*   No results for input(s): LIPASE, AMYLASE in the last 168 hours. No results for input(s): AMMONIA in the last 168 hours.  Coagulation Profile:  Recent Labs Lab 09/14/15 1904 09/15/15  [id number] 09/16/15 0532 09/17/15 0530 09/18/15 0508  INR 1.90* 2.54* 1.95* 1.66* 2.17*    Cardiac Enzymes: No results for input(s): CKTOTAL, CKMB, CKMBINDEX, TROPONINI in the last 168 hours.  BNP (last 3 results) No results for input(s): PROBNP in the last 8760 hours.  HbA1C: No results for input(s): HGBA1C in the last 72 hours.  CBG: No results for input(s): GLUCAP in the last 168 hours.  Lipid Profile: No results for input(s): CHOL, HDL, LDLCALC, TRIG, CHOLHDL, LDLDIRECT in the last 72 hours.  Thyroid Function Tests: No results for input(s): TSH, T4TOTAL, FREET4, T3FREE, THYROIDAB in the last 72 hours.  Anemia Panel: No results for input(s): VITAMINB12, FOLATE, FERRITIN, TIBC, IRON, RETICCTPCT in the last 72 hours.  Urine analysis:    Component Value Date/Time    COLORURINE YELLOW 09/14/2015 1834   APPEARANCEUR CLOUDY* 09/14/2015 1834   LABSPEC 1.013 09/14/2015 1834   PHURINE 5.5 09/14/2015 1834   GLUCOSEU NEGATIVE 09/14/2015 1834   HGBUR LARGE* 09/14/2015 1834   BILIRUBINUR NEGATIVE 09/14/2015 1834   KETONESUR NEGATIVE 09/14/2015 1834   PROTEINUR 30* 09/14/2015 1834   UROBILINOGEN 0.2 01/23/2015 1830   NITRITE POSITIVE* 09/14/2015 1834   LEUKOCYTESUR NEGATIVE 09/14/2015 1834    Sepsis Labs: Lactic Acid, Venous No results found for: LATICACIDVEN  MICROBIOLOGY: Recent Results (from the past 240 hour(s))  Culture, blood (routine x 2)     Status: None (Preliminary result)   Collection Time: 09/14/15  6:35 PM  Result Value Ref Range Status   Specimen Description BLOOD RIGHT ARM  Final   Special Requests BOTTLES DRAWN AEROBIC AND ANAEROBIC 6CC  Final   Culture   Final    NO GROWTH 4 DAYS Performed at [facility]    Report Status PENDING  Incomplete  Culture, blood (routine x 2)     Status: None (Preliminary result)   Collection Time: 09/14/15  6:40 PM  Result Value Ref Range Status   Specimen Description BLOOD LEFT HAND  Final   Special Requests BOTTLES DRAWN AEROBIC AND ANAEROBIC 5CC  Final   Culture   Final    NO GROWTH 4 DAYS Performed at [facility]    Report Status PENDING  Incomplete    RADIOLOGY STUDIES/RESULTS: Mr Kizzie Fantasia Contrast  08/25/2015  CLINICAL DATA:  Ongoing headache. Arm pain and pain beneath the sternum and ribs for 2 weeks. Right leg pain. Metastatic breast cancer. EXAM: MRI HEAD WITHOUT AND WITH CONTRAST; MRI CERVICAL SPINE WITHOUT AND WITH CONTRAST; MRI LUMBAR SPINE WITHOUT AND WITH CONTRAST; MRI THORACIC SPINE WITHOUT AND WITH CONTRAST TECHNIQUE: Multiplanar, multiecho pulse sequences of the brain and surrounding structures were obtained according to standard protocol without and with intravenous contrast; Multiplanar and multiecho pulse sequences of the cervical spine, to include the  craniocervical junction and cervicothoracic junction, were obtained according to standard protocol without and with intravenous contrast.; Multiplanar and multiecho pulse sequences of the lumbar spine were obtained without and with intravenous contrast.; Multiplanar and multiecho pulse sequences of the thoracic spine were obtained without and with intravenous contrast. CONTRAST:  89mL MULTIHANCE GADOBENATE DIMEGLUMINE 529 MG/ML IV SOLN COMPARISON:  Head CT 08/05/2015 and MRI 02/14/2009. Cervical spine MRI 05/14/2013. Thoracic and lumbar spine MRI 07/05/2015. FINDINGS: MR HEAD FINDINGS: There is no evidence of acute infarct, intracranial hemorrhage, mass, midline shift, or extra-axial fluid collection. There is mild cerebral atrophy. Small foci of T2 hyperintensity in the cerebral white matter bilaterally, stable to slightly increased from 2010 and nonspecific but compatible  with mild-to-moderate chronic small vessel ischemic disease. No enhancing brain lesions are identified. There is at most minimally prominent dural enhancement without nodularity. Small enhancing lesions are present throughout the skull, progressive from 2010 (for example 3 cm left parietal lesion on image 28 of series 41). There is involvement of the clivus. Prior bilateral cataract extraction is noted. There is a trace left mastoid effusion. No significant paranasal sinus inflammatory disease. Major intracranial vascular flow voids are preserved. MR CERVICAL SPINE FINDINGS: Cervical spine straightening is unchanged. There is no significant listhesis. There is progressive osseous metastatic disease throughout the cervical spine since the 2015 MRI. There is now complete marrow replacement involving the C4 vertebral body with patchy enhancement including a 1 cm focus posteriorly. There is also a left-sided posterior element involvement at C4 which is new. No epidural tumor or spinal cord compression is identified. No cervical spinal cord signal  abnormality is identified within limitations of motion artifact. Diffuse cervical disc degeneration is again seen. There is mild neural foraminal stenosis on the right at C3-4, on the left at C4-5, and likely bilaterally at C5-6 and C6-7. This is overall similar to the prior MRI. No significant spinal stenosis is seen. The paraspinal soft tissues are unremarkable. MR THORACIC SPINE FINDINGS: Diffuse osseous metastatic disease is again seen throughout the vertebral bodies and posterior elements of the thoracic spine. There has overall been mild progression from the 07/05/2015 thoracic spine MRI. There is now near complete involvement of the T10 vertebral body. There is mildly progressive marrow replacement involving multiple mid thoracic vertebral bodies as well as T1 and T2. The T3 vertebral body remains completely involved. There is a mild T12 superior endplate compression fracture which demonstrates slightly progressive height loss compared to the prior study. There is slightly prominent ventral epidural enhancement in the mid thoracic spine from T5 -T8, most conspicuous and mildly nodular in a left paracentral location at the T8 superior endplate level (series 37, image 33). This may reflect very small volume epidural tumor, prominent epidural venous plexus, or a combination of both. This has likely not significantly changed from the prior thoracic spine MRI, although there is much more motion artifact on axial sequences on that study. The spinal cord is normal in caliber and signal. There is no spinal cord compression. Multilevel disc degeneration is again seen with disc bulging and small disc protrusions without significant spinal canal or neural foraminal stenosis. Small left adrenal nodule and mild left upper pole renal caliectasis or cysts are partially visualized, grossly similar to the 09/07/2014 PET-CT. MR LUMBAR SPINE FINDINGS: Vertebral alignment is unchanged, with trace anterolisthesis again seen of L3  on L4. Osseous lesions throughout the lumbar spine do not appear significantly changed from the prior lumbar spine MRI, with the largest lesion in the L1 vertebral body. S3 level sacral osseous metastatic disease is partially visualized and grossly similar to the prior study. 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 Lumbar vertebral body heights are preserved. There is diffuse lumbar disc desiccation. Severe disc space height loss at L4-5 is unchanged. Multilevel disc and facet degeneration does not appear significantly changed, with mild right foraminal stenosis again seen at L3-4. There is no spinal stenosis. No epidural tumor is identified. Conus medullaris is normal in signal and terminates at L1. IMPRESSION: 1. No acute intracranial abnormality. No evidence of brain metastases. 2. Numerous osseous skull metastases, progressed from 2010. 3. Widespread spinal osseous metastatic disease, with interval progression in the cervical and thoracic spine. No  sizable epidural tumor identified. Slightly prominent ventral epidural enhancement in the mid thoracic spine is favored to mostly reflect epidural venous plexus, however trace epidural tumor is possible particularly at T8. 4. No spinal stenosis. 5. Mild T12 superior endplate compression fracture with slightly progressive height loss from 07/05/2015. Electronically Signed   By: Logan Bores M.D.   On: 08/25/2015 17:08   Mr Cervical Spine W Wo Contrast  08/25/2015  CLINICAL DATA:  Ongoing headache. Arm pain and pain beneath the sternum and ribs for 2 weeks. Right leg pain. Metastatic breast cancer. EXAM: MRI HEAD WITHOUT AND WITH CONTRAST; MRI CERVICAL SPINE WITHOUT AND WITH CONTRAST; MRI LUMBAR SPINE WITHOUT AND WITH CONTRAST; MRI THORACIC SPINE WITHOUT AND WITH CONTRAST TECHNIQUE: Multiplanar, multiecho pulse sequences of the brain and surrounding structures were obtained according to standard protocol without and with intravenous contrast; Multiplanar  and multiecho pulse sequences of the cervical spine, to include the craniocervical junction and cervicothoracic junction, were obtained according to standard protocol without and with intravenous contrast.; Multiplanar and multiecho pulse sequences of the lumbar spine were obtained without and with intravenous contrast.; Multiplanar and multiecho pulse sequences of the thoracic spine were obtained without and with intravenous contrast. CONTRAST:  17mL MULTIHANCE GADOBENATE DIMEGLUMINE 529 MG/ML IV SOLN COMPARISON:  Head CT 08/05/2015 and MRI 02/14/2009. Cervical spine MRI 05/14/2013. Thoracic and lumbar spine MRI 07/05/2015. FINDINGS: MR HEAD FINDINGS: There is no evidence of acute infarct, intracranial hemorrhage, mass, midline shift, or extra-axial fluid collection. There is mild cerebral atrophy. Small foci of T2 hyperintensity in the cerebral white matter bilaterally, stable to slightly increased from 2010 and nonspecific but compatible with mild-to-moderate chronic small vessel ischemic disease. No enhancing brain lesions are identified. There is at most minimally prominent dural enhancement without nodularity. Small enhancing lesions are present throughout the skull, progressive from 2010 (for example 3 cm left parietal lesion on image 28 of series 41). There is involvement of the clivus. Prior bilateral cataract extraction is noted. There is a trace left mastoid effusion. No significant paranasal sinus inflammatory disease. Major intracranial vascular flow voids are preserved. MR CERVICAL SPINE FINDINGS: Cervical spine straightening is unchanged. There is no significant listhesis. There is progressive osseous metastatic disease throughout the cervical spine since the 2015 MRI. There is now complete marrow replacement involving the C4 vertebral body with patchy enhancement including a 1 cm focus posteriorly. There is also a left-sided posterior element involvement at C4 which is new. No epidural tumor or  spinal cord compression is identified. No cervical spinal cord signal abnormality is identified within limitations of motion artifact. Diffuse cervical disc degeneration is again seen. There is mild neural foraminal stenosis on the right at C3-4, on the left at C4-5, and likely bilaterally at C5-6 and C6-7. This is overall similar to the prior MRI. No significant spinal stenosis is seen. The paraspinal soft tissues are unremarkable. MR THORACIC SPINE FINDINGS: Diffuse osseous metastatic disease is again seen throughout the vertebral bodies and posterior elements of the thoracic spine. There has overall been mild progression from the 07/05/2015 thoracic spine MRI. There is now near complete involvement of the T10 vertebral body. There is mildly progressive marrow replacement involving multiple mid thoracic vertebral bodies as well as T1 and T2. The T3 vertebral body remains completely involved. There is a mild T12 superior endplate compression fracture which demonstrates slightly progressive height loss compared to the prior study. There is slightly prominent ventral epidural enhancement in the mid thoracic spine from T5 -T8,  most conspicuous and mildly nodular in a left paracentral location at the T8 superior endplate level (series 37, image 33). This may reflect very small volume epidural tumor, prominent epidural venous plexus, or a combination of both. This has likely not significantly changed from the prior thoracic spine MRI, although there is much more motion artifact on axial sequences on that study. The spinal cord is normal in caliber and signal. There is no spinal cord compression. Multilevel disc degeneration is again seen with disc bulging and small disc protrusions without significant spinal canal or neural foraminal stenosis. Small left adrenal nodule and mild left upper pole renal caliectasis or cysts are partially visualized, grossly similar to the 09/07/2014 PET-CT. MR LUMBAR SPINE FINDINGS:  Vertebral alignment is unchanged, with trace anterolisthesis again seen of L3 on L4. Osseous lesions throughout the lumbar spine do not appear significantly changed from the prior lumbar spine MRI, with the largest lesion in the L1 vertebral body. S3 level sacral osseous metastatic disease is partially visualized and grossly similar to the prior study. 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 Lumbar vertebral body heights are preserved. There is diffuse lumbar disc desiccation. Severe disc space height loss at L4-5 is unchanged. Multilevel disc and facet degeneration does not appear significantly changed, with mild right foraminal stenosis again seen at L3-4. There is no spinal stenosis. No epidural tumor is identified. Conus medullaris is normal in signal and terminates at L1. IMPRESSION: 1. No acute intracranial abnormality. No evidence of brain metastases. 2. Numerous osseous skull metastases, progressed from 2010. 3. Widespread spinal osseous metastatic disease, with interval progression in the cervical and thoracic spine. No sizable epidural tumor identified. Slightly prominent ventral epidural enhancement in the mid thoracic spine is favored to mostly reflect epidural venous plexus, however trace epidural tumor is possible particularly at T8. 4. No spinal stenosis. 5. Mild T12 superior endplate compression fracture with slightly progressive height loss from 07/05/2015. Electronically Signed   By: Logan Bores M.D.   On: 08/25/2015 17:08   Mr Thoracic Spine W Wo Contrast  08/25/2015  CLINICAL DATA:  Ongoing headache. Arm pain and pain beneath the sternum and ribs for 2 weeks. Right leg pain. Metastatic breast cancer. EXAM: MRI HEAD WITHOUT AND WITH CONTRAST; MRI CERVICAL SPINE WITHOUT AND WITH CONTRAST; MRI LUMBAR SPINE WITHOUT AND WITH CONTRAST; MRI THORACIC SPINE WITHOUT AND WITH CONTRAST TECHNIQUE: Multiplanar, multiecho pulse sequences of the brain and surrounding structures were obtained  according to standard protocol without and with intravenous contrast; Multiplanar and multiecho pulse sequences of the cervical spine, to include the craniocervical junction and cervicothoracic junction, were obtained according to standard protocol without and with intravenous contrast.; Multiplanar and multiecho pulse sequences of the lumbar spine were obtained without and with intravenous contrast.; Multiplanar and multiecho pulse sequences of the thoracic spine were obtained without and with intravenous contrast. CONTRAST:  1mL MULTIHANCE GADOBENATE DIMEGLUMINE 529 MG/ML IV SOLN COMPARISON:  Head CT 08/05/2015 and MRI 02/14/2009. Cervical spine MRI 05/14/2013. Thoracic and lumbar spine MRI 07/05/2015. FINDINGS: MR HEAD FINDINGS: There is no evidence of acute infarct, intracranial hemorrhage, mass, midline shift, or extra-axial fluid collection. There is mild cerebral atrophy. Small foci of T2 hyperintensity in the cerebral white matter bilaterally, stable to slightly increased from 2010 and nonspecific but compatible with mild-to-moderate chronic small vessel ischemic disease. No enhancing brain lesions are identified. There is at most minimally prominent dural enhancement without nodularity. Small enhancing lesions are present throughout the skull, progressive from 2010 (for example  3 cm left parietal lesion on image 28 of series 41). There is involvement of the clivus. Prior bilateral cataract extraction is noted. There is a trace left mastoid effusion. No significant paranasal sinus inflammatory disease. Major intracranial vascular flow voids are preserved. MR CERVICAL SPINE FINDINGS: Cervical spine straightening is unchanged. There is no significant listhesis. There is progressive osseous metastatic disease throughout the cervical spine since the 2015 MRI. There is now complete marrow replacement involving the C4 vertebral body with patchy enhancement including a 1 cm focus posteriorly. There is also a  left-sided posterior element involvement at C4 which is new. No epidural tumor or spinal cord compression is identified. No cervical spinal cord signal abnormality is identified within limitations of motion artifact. Diffuse cervical disc degeneration is again seen. There is mild neural foraminal stenosis on the right at C3-4, on the left at C4-5, and likely bilaterally at C5-6 and C6-7. This is overall similar to the prior MRI. No significant spinal stenosis is seen. The paraspinal soft tissues are unremarkable. MR THORACIC SPINE FINDINGS: Diffuse osseous metastatic disease is again seen throughout the vertebral bodies and posterior elements of the thoracic spine. There has overall been mild progression from the 07/05/2015 thoracic spine MRI. There is now near complete involvement of the T10 vertebral body. There is mildly progressive marrow replacement involving multiple mid thoracic vertebral bodies as well as T1 and T2. The T3 vertebral body remains completely involved. There is a mild T12 superior endplate compression fracture which demonstrates slightly progressive height loss compared to the prior study. There is slightly prominent ventral epidural enhancement in the mid thoracic spine from T5 -T8, most conspicuous and mildly nodular in a left paracentral location at the T8 superior endplate level (series 37, image 33). This may reflect very small volume epidural tumor, prominent epidural venous plexus, or a combination of both. This has likely not significantly changed from the prior thoracic spine MRI, although there is much more motion artifact on axial sequences on that study. The spinal cord is normal in caliber and signal. There is no spinal cord compression. Multilevel disc degeneration is again seen with disc bulging and small disc protrusions without significant spinal canal or neural foraminal stenosis. Small left adrenal nodule and mild left upper pole renal caliectasis or cysts are partially  visualized, grossly similar to the 09/07/2014 PET-CT. MR LUMBAR SPINE FINDINGS: Vertebral alignment is unchanged, with trace anterolisthesis again seen of L3 on L4. Osseous lesions throughout the lumbar spine do not appear significantly changed from the prior lumbar spine MRI, with the largest lesion in the L1 vertebral body. S3 level sacral osseous metastatic disease is partially visualized and grossly similar to the prior study. 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 Lumbar vertebral body heights are preserved. There is diffuse lumbar disc desiccation. Severe disc space height loss at L4-5 is unchanged. Multilevel disc and facet degeneration does not appear significantly changed, with mild right foraminal stenosis again seen at L3-4. There is no spinal stenosis. No epidural tumor is identified. Conus medullaris is normal in signal and terminates at L1. IMPRESSION: 1. No acute intracranial abnormality. No evidence of brain metastases. 2. Numerous osseous skull metastases, progressed from 2010. 3. Widespread spinal osseous metastatic disease, with interval progression in the cervical and thoracic spine. No sizable epidural tumor identified. Slightly prominent ventral epidural enhancement in the mid thoracic spine is favored to mostly reflect epidural venous plexus, however trace epidural tumor is possible particularly at T8. 4. No spinal stenosis. 5.  Mild T12 superior endplate compression fracture with slightly progressive height loss from 07/05/2015. Electronically Signed   By: Logan Bores M.D.   On: 08/25/2015 17:08   Mr Lumbar Spine W Wo Contrast  08/25/2015  CLINICAL DATA:  Ongoing headache. Arm pain and pain beneath the sternum and ribs for 2 weeks. Right leg pain. Metastatic breast cancer. EXAM: MRI HEAD WITHOUT AND WITH CONTRAST; MRI CERVICAL SPINE WITHOUT AND WITH CONTRAST; MRI LUMBAR SPINE WITHOUT AND WITH CONTRAST; MRI THORACIC SPINE WITHOUT AND WITH CONTRAST TECHNIQUE: Multiplanar,  multiecho pulse sequences of the brain and surrounding structures were obtained according to standard protocol without and with intravenous contrast; Multiplanar and multiecho pulse sequences of the cervical spine, to include the craniocervical junction and cervicothoracic junction, were obtained according to standard protocol without and with intravenous contrast.; Multiplanar and multiecho pulse sequences of the lumbar spine were obtained without and with intravenous contrast.; Multiplanar and multiecho pulse sequences of the thoracic spine were obtained without and with intravenous contrast. CONTRAST:  74mL MULTIHANCE GADOBENATE DIMEGLUMINE 529 MG/ML IV SOLN COMPARISON:  Head CT 08/05/2015 and MRI 02/14/2009. Cervical spine MRI 05/14/2013. Thoracic and lumbar spine MRI 07/05/2015. FINDINGS: MR HEAD FINDINGS: There is no evidence of acute infarct, intracranial hemorrhage, mass, midline shift, or extra-axial fluid collection. There is mild cerebral atrophy. Small foci of T2 hyperintensity in the cerebral white matter bilaterally, stable to slightly increased from 2010 and nonspecific but compatible with mild-to-moderate chronic small vessel ischemic disease. No enhancing brain lesions are identified. There is at most minimally prominent dural enhancement without nodularity. Small enhancing lesions are present throughout the skull, progressive from 2010 (for example 3 cm left parietal lesion on image 28 of series 41). There is involvement of the clivus. Prior bilateral cataract extraction is noted. There is a trace left mastoid effusion. No significant paranasal sinus inflammatory disease. Major intracranial vascular flow voids are preserved. MR CERVICAL SPINE FINDINGS: Cervical spine straightening is unchanged. There is no significant listhesis. There is progressive osseous metastatic disease throughout the cervical spine since the 2015 MRI. There is now complete marrow replacement involving the C4 vertebral body  with patchy enhancement including a 1 cm focus posteriorly. There is also a left-sided posterior element involvement at C4 which is new. No epidural tumor or spinal cord compression is identified. No cervical spinal cord signal abnormality is identified within limitations of motion artifact. Diffuse cervical disc degeneration is again seen. There is mild neural foraminal stenosis on the right at C3-4, on the left at C4-5, and likely bilaterally at C5-6 and C6-7. This is overall similar to the prior MRI. No significant spinal stenosis is seen. The paraspinal soft tissues are unremarkable. MR THORACIC SPINE FINDINGS: Diffuse osseous metastatic disease is again seen throughout the vertebral bodies and posterior elements of the thoracic spine. There has overall been mild progression from the 07/05/2015 thoracic spine MRI. There is now near complete involvement of the T10 vertebral body. There is mildly progressive marrow replacement involving multiple mid thoracic vertebral bodies as well as T1 and T2. The T3 vertebral body remains completely involved. There is a mild T12 superior endplate compression fracture which demonstrates slightly progressive height loss compared to the prior study. There is slightly prominent ventral epidural enhancement in the mid thoracic spine from T5 -T8, most conspicuous and mildly nodular in a left paracentral location at the T8 superior endplate level (series 37, image 33). This may reflect very small volume epidural tumor, prominent epidural venous plexus, or a combination of  both. This has likely not significantly changed from the prior thoracic spine MRI, although there is much more motion artifact on axial sequences on that study. The spinal cord is normal in caliber and signal. There is no spinal cord compression. Multilevel disc degeneration is again seen with disc bulging and small disc protrusions without significant spinal canal or neural foraminal stenosis. Small left adrenal  nodule and mild left upper pole renal caliectasis or cysts are partially visualized, grossly similar to the 09/07/2014 PET-CT. MR LUMBAR SPINE FINDINGS: Vertebral alignment is unchanged, with trace anterolisthesis again seen of L3 on L4. Osseous lesions throughout the lumbar spine do not appear significantly changed from the prior lumbar spine MRI, with the largest lesion in the L1 vertebral body. S3 level sacral osseous metastatic disease is partially visualized and grossly similar to the prior study. 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 Lumbar vertebral body heights are preserved. There is diffuse lumbar disc desiccation. Severe disc space height loss at L4-5 is unchanged. Multilevel disc and facet degeneration does not appear significantly changed, with mild right foraminal stenosis again seen at L3-4. There is no spinal stenosis. No epidural tumor is identified. Conus medullaris is normal in signal and terminates at L1. IMPRESSION: 1. No acute intracranial abnormality. No evidence of brain metastases. 2. Numerous osseous skull metastases, progressed from 2010. 3. Widespread spinal osseous metastatic disease, with interval progression in the cervical and thoracic spine. No sizable epidural tumor identified. Slightly prominent ventral epidural enhancement in the mid thoracic spine is favored to mostly reflect epidural venous plexus, however trace epidural tumor is possible particularly at T8. 4. No spinal stenosis. 5. Mild T12 superior endplate compression fracture with slightly progressive height loss from 07/05/2015. Electronically Signed   By: Logan Bores M.D.   On: 08/25/2015 17:08   Nm Pet Image Restag (ps) Skull Base To Thigh  08/31/2015  CLINICAL DATA:  Subsequent treatment strategy for metastatic breast carcinoma to bone. EXAM: NUCLEAR MEDICINE PET SKULL BASE TO THIGH TECHNIQUE: 6.3 mCi F-18 FDG was injected intravenously. Full-ring PET imaging was performed from the skull base to thigh  after the radiotracer. CT data was obtained and used for attenuation correction and anatomic localization. FASTING BLOOD GLUCOSE:  Value: 103 mg/dl COMPARISON:  09/07/2014 FINDINGS: NECK No hypermetabolic lymph nodes in the neck. CHEST No hypermetabolic mediastinal or hilar nodes. No suspicious pulmonary nodules on the CT scan. Biapical pleural- parenchymal scarring appears stable. Previously seen area of hypermetabolic airspace disease in the left upper lobe has nearly completely resolved, consistent with resolving infectious or inflammatory process. ABDOMEN/PELVIS No abnormal hypermetabolic activity within the liver, pancreas, adrenal glands, or spleen. No hypermetabolic lymph nodes in the abdomen or pelvis. Tiny left hepatic lobe cyst remains stable. Prior cholecystectomy noted. 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 11 mm left adrenal nodule remains stable and shows no metabolic activity, consistent with benign adrenal adenoma. Colonic diverticulosis noted, without evidence of diverticulitis. SKELETON Mixed lytic and sclerotic bone metastases again seen within spine, pelvis, sternum, and proximal left femur. These appear stable and show no significant change in low-grade metabolic activity, consistent with largely treated osseous metastatic disease. IMPRESSION: Near complete resolution of hypermetabolic airspace disease in left upper lobe, consistent with resolving infectious or inflammatory process. No significant change in appearance of largely treated osseous metastatic disease. No new or progressive metastatic disease identified. Electronically Signed   By: Earle Gell M.D.   On: 08/31/2015 12:48   Dg Chest Port 1 View  09/15/2015  CLINICAL  DATA:  Short breath, cough, fever EXAM: PORTABLE CHEST 1 VIEW COMPARISON:  08/31/2015 FINDINGS: Normal cardiac silhouette with ectatic aorta. Increased density in the LEFT upper lobe. The biapical densities are not changed from CT. Small LEFT effusion. No  pneumothorax. IMPRESSION: 1. Increasing density in the LEFT upper lobe. Concerning for edema or infection. Electronically Signed   By: Suzy Bouchard M.D.   On: 09/15/2015 07:26   US Abdomen Limited Ruq  09/15/2015  CLINICAL DATA:  Elevated liver function tests. EXAM: US ABDOMEN LIMITED - RIGHT UPPER QUADRANT COMPARISON:  08/31/2015, PET-CT. FINDINGS: Gallbladder: Surgically absent Common bile duct: Diameter: Dilated to a maximum of 17 mm, unchanged from the recent prior PET-CT. No sonographic evidence of a duct stone. Liver: Cyst in the left liver lobe measuring 13 somewhat mottled appearance of the liver in its more posterior aspect, which could be technique. The possibility of numerous small infiltrating lesion should be considered in the patient's clinical setting. Liver is normal in size and overall echogenicity. IMPRESSION: 1. Chronically dilated common bile duct. Status post cholecystectomy. 2. Mottled appearance of the posterior liver. Consider followup liver MRI with and without contrast. Electronically Signed   By: Lajean Manes M.D.   On: 09/15/2015 10:04     LOS: 4 days   Oren Binet, MD  [facility] Pager:[phone]  If 7PM-7AM, please contact night-coverage [email or web address] Password [email or web address] 09/18/2015, 1:39 PM

## 2015-09-18 NOTE — Progress Notes (Signed)
Daily Progress Note   Patient Name: [name]       Date: 09/18/2015 DOB: 01/13/1946  Age: 70 y.o. MRN#: [id number] Attending Physician: Jonetta Osgood, MD Primary Care Physician: Curlene Labrum, MD Admit Date: 09/14/2015  Reason for Consultation/Follow-up: Pain control Life limiting illness: breast cancer, bone mets, admitted with SIRS and PNA Subjective:  resting in bed, comfortable. In no distress.   Length of Stay: 4  Current Medications: Scheduled Meds:  . ceFEPime (MAXIPIME) IV  1 g Intravenous Q12H  . dexamethasone  4 mg Intravenous Q12H  . feeding supplement  1 Container Oral BID BM  . fentaNYL  25 mcg Transdermal Q72H  . lidocaine  1 patch Transdermal Q24H  . pantoprazole  40 mg Oral Daily  . Warfarin - Pharmacist Dosing Inpatient   Does not apply q1800    Continuous Infusions:    PRN Meds: gi cocktail, HYDROmorphone (DILAUDID) injection, magic mouthwash w/lidocaine, morphine, ondansetron (ZOFRAN) IV, polyvinyl alcohol, senna-docusate  Physical Exam         NAD S1 S2 Clear No edema Awake alert  Vital Signs: BP 134/68 mmHg  Pulse 75  Temp(Src) 98 F (36.7 C) (Oral)  Resp 20  Ht 5' 2"  (1.575 m)  Wt 59.648 kg (131 lb 8 oz)  BMI 24.05 kg/m2  SpO2 91% SpO2: SpO2: 91 % O2 Device: O2 Device: Not Delivered O2 Flow Rate: O2 Flow Rate (L/min): 2 L/min  Intake/output summary:  Intake/Output Summary (Last 24 hours) at 09/18/15 0834 Last data filed at 09/18/15 0272  Gross per 24 hour  Intake    320 ml  Output    600 ml  Net   -280 ml   LBM: Last BM Date: 09/17/15 Baseline Weight: Weight: 59.648 kg (131 lb 8 oz) Most recent weight: Weight: 59.648 kg (131 lb 8 oz)       Palliative Assessment/Data:    Flowsheet Rows        Most Recent Value   Intake Tab    Referral Department  Hospitalist   Unit at Time of Referral  Oncology Unit   Palliative Care Primary Diagnosis  Cancer   Palliative Care Type  Return patient Palliative Care   Reason for referral  Pain   Date first seen by Palliative Care  09/17/15   Clinical Assessment  Palliative Performance Scale Score  50%   Pain Max last 24 hours  6   Pain Min Last 24 hours  4   Dyspnea Max Last 24 Hours  4   Dyspnea Min Last 24 hours  3   Nausea Max Last 24 Hours  3   Nausea Min Last 24 Hours  2   Psychosocial & Spiritual Assessment    Palliative Care Outcomes    Patient/Family meeting held?  Yes   Who was at the meeting?  patient herself   Palliative Care follow-up planned  Yes, Facility      Patient Active Problem List   Diagnosis Date Noted  . Encounter for palliative care   . Generalized abdominal pain   . Elevated LFTs   . SIRS (systemic inflammatory response syndrome) ([place]) 09/14/2015  . Pain from bone metastases ([place]) 06/28/2015  . Cancer of overlapping sites of right breast ([place]) 05/11/2015  . Bilateral breast cancer ([place]) 05/11/2015  . C. difficile colitis 02/08/2015  . Protein-calorie malnutrition, severe ([place]) 01/25/2015  . Clostridium difficile diarrhea   . Hypokalemia 01/24/2015  . AKI (acute kidney injury) ([place]) 01/24/2015  . Essential hypertension 01/24/2015  . Abdominal pain   . Dehydration 09/09/2014  . HCAP (healthcare-associated pneumonia) 08/09/2014  . Cavitating mass in left upper lung lobe 08/09/2014  . Anorexia 03/02/2013  . Weight loss due to medication 03/02/2013  . Swollen R ankle 08/10/2012  . Fracture of ankle, medial malleolus, right, closed 08/10/2012  . Rotator cuff tear 09/24/2011  . Breast cancer metastasized to bone ([place]) 04/30/2011  . Rash 03/01/2011  . Seroma, post-traumatic 12/25/2010  . RECTAL BLEEDING 12/08/2009  . FLATULENCE 12/08/2009  . OTH NONSPC ABN FINDNG RAD&OTH EXM BODY STRUCTURE 01/10/2009  . GERD 12/26/2008  .  Osteoarthritis 12/26/2008  . Nausea without vomiting 12/26/2008  . Diarrhea 12/26/2008  . Personal history of thrombophlebitis 12/26/2008  . COLONIC POLYPS, HX OF 12/26/2008    Palliative Care Assessment & Plan   Assessment:  breast ca met dz to bone Uncontrolled pain Admitted for SIRS PNA   Recommendations/Plan:  Remains on antibiotics as per TRH. Continue current pain regimen. Change Trans Derm Fentanyl to 50 mcg Q 72 hours on 5-23. Using less IV Dilaudid and also using less PRN PO MSIR since the past 24 hours.   Goals of Care and Additional Recommendations:  Monitor patient's disease trajectory, continue to explore whether or not it is deemed feasible to pursue initial plan to pursue port a cath and additional treatments in light of advancing disease and current hospitalization for infection. Palliative will remain available in case goals of care conversations discussions/family meeting needs to be held in this hospitalization. For now, continuing to assist with pain management. Thank you.   Code Status:    Code Status Orders        Start     Ordered   09/14/15 1820  Full code   Continuous     09/14/15 1819    Code Status History    Date Active Date Inactive Code Status Order ID Comments User Context   01/24/2015  6:59 PM 01/28/2015  7:06 PM Full Code [id number]  Waldemar Dickens, MD Inpatient   08/09/2014  1:31 PM 08/11/2014  7:05 PM Full Code [id number]  Melton Alar, PA-C Inpatient    Advance Directive Documentation        Most Recent Value   Type of Advance Directive  Healthcare Power of [place], Living will  Pre-existing out of facility DNR order (yellow form or pink MOST form)     "MOST" Form in Place?         Prognosis:   Unable to determine but appears guarded, see above.   Discharge Planning:  To Be Determined  Care plan was discussed with  patient  Thank you for allowing the Palliative Medicine Team to assist in the care of this patient.   Time  In: 8 Time Out: 825 Total Time 25 Prolonged Time Billed  no       Greater than 50%  of this time was spent counseling and coordinating care related to the above assessment and plan.  Loistine Chance, MD [phone] Please contact Palliative Medicine Team phone at [phone] for questions and concerns.

## 2015-09-18 NOTE — Progress Notes (Signed)
ANTICOAGULATION CONSULT NOTE - Follow-Up  Pharmacy Consult for Warfarin Indication: hx extensive superficial vein thrombosis  Allergies  Allergen Reactions  . Aspirin Nausea And Vomiting  . Boost Pudding [Nutritional Supplements] Diarrhea  . Percodan [Oxycodone-Aspirin] Nausea Only  . Codeine Nausea Only   Patient Measurements: Height: 5\' 2"  (157.5 cm) Weight: 131 lb 8 oz (59.648 kg) IBW/kg (Calculated) : 50.1  Vital Signs: Temp: 98 F (36.7 C) (05/22 0521) Temp Source: Oral (05/22 0521) BP: 134/68 mmHg (05/22 0521) Pulse Rate: 75 (05/22 0521)  Labs:  Recent Labs  09/16/15 0532 09/17/15 0530 09/18/15 0508  HGB  --  9.1*  --   HCT  --  27.6*  --   PLT  --  148*  --   LABPROT 21.5* 19.0* 23.3*  INR 1.95* 1.66* 2.17*  CREATININE 0.62 0.64  --     Estimated Creatinine Clearance: 51 mL/min (by C-G formula based on Cr of 0.64).  Medications:  Scheduled:  . ceFEPime (MAXIPIME) IV  1 g Intravenous Q12H  . dexamethasone  4 mg Intravenous Q12H  . feeding supplement  1 Container Oral BID BM  . fentaNYL  25 mcg Transdermal Q72H  . lidocaine  1 patch Transdermal Q24H  . pantoprazole  40 mg Oral Daily  . Warfarin - Pharmacist Dosing Inpatient   Does not apply q1800   Assessment: 37 yoF with hx of mets breast cancer on letrozole+ palbocicib, denosumab with plan for PAC placement on 5/18 for chemo to start in June. She was also on warfarin PTA for hx extensive vein thrombosis. Outpatient records patient was supposed to hold her warfarin three days prior to [facility] procedure.  However,  her INR was supratherapeutic at 9 on 5/10 and patient was instructed to hold her warfarin at that time.  Patient has not gotten PAC yet. She presented to the ED on 5/18 with c/o fever, chills, cough and worsening weakness. INR was 1.90 on admission  Home warfarin regimen: 2 mg daily except 4 mg on FSS   Today, 09/18/2015: - INR this AM is therapeutic @ 2.17, up from 1.66 on 5/21. - Most recent  doses are 4 mg on 5/21 and 2 mg on 5/20. - CBC: Hgb stble, pltc improved (112->148) - No bleeding documented - Drug-drug intxns: abx - Cardiac diet  Goal of Therapy:  INR 2-3 Monitor platelets by anticoagulation protocol: Yes   Plan:  - Warfarin 2 mg PO x1 today (Home dose) - Please advise if/when warfarin needs to be held, if to proceed with port-a-cath placement with this admission  - per notes this appears will be delayed d/t fever - Daily INR - Monitor for s/s bleeding  Myrna Blazer, PharmD Candidate  09/18/2015 8:00 AM

## 2015-09-18 NOTE — Care Management Important Message (Signed)
Important Message  Patient Details  Name: [name] MRN: [id number] Date of Birth: [birth year]   Medicare Important Message Given:  Yes    [name] 09/18/2015, 9:28 AMImportant Message  Patient Details  Name: [name] MRN: [id number] Date of Birth: May 13, 1945   Medicare Important Message Given:  Yes    [name] 09/18/2015, 9:28 AM

## 2015-09-18 NOTE — Clinical Social Work Placement (Signed)
CSW received consult from Dr. Sloan Leiter that patient will need SNF though she [place] of state ([place] CSW spoke with patient who states that she is planning to talk to her friend to see if she would be willing to take care of her and transport her down for chemo every 3 weeks. CSW explained that SNF would be the backup plan & patient consented to having her information sent out to [place] provided patient with SNF bed offers & she states that if her friend is unable to take care of her, she would be willing to go to [facility]. Insurance authorization process has been initiated.     Raynaldo Opitz, [facility] Clinical Social Worker cell #: [phone]    CLINICAL SOCIAL WORK PLACEMENT  NOTE  Date:  09/18/2015  Patient Details  Name: [name] MRN: [id number] Date of Birth: 04/06/1946  Clinical Social Work is seeking post-discharge placement for this patient at the [place] level of care (*CSW will initial, date and re-position this form in  chart as items are completed):  Yes   Patient/family provided with [place] Work Department's list of facilities offering this level of care within the geographic area requested by the patient (or if unable, by the patient's family).  Yes   Patient/family informed of their freedom to choose among providers that offer the needed level of care, that participate in Medicare, Medicaid or managed care program needed by the patient, have an available bed and are willing to accept the patient.  Yes   Patient/family informed of [facility]'s ownership interest in [facility] and [facility], as well as of the fact that they are under no obligation to receive care at these facilities.  PASRR submitted to EDS on 09/18/15     PASRR number received on       Existing PASRR number confirmed on       FL2 transmitted to all facilities in geographic area  requested by pt/family on 09/18/15     FL2 transmitted to all facilities within larger geographic area on       Patient informed that his/her managed care company has contracts with or will negotiate with certain facilities, including the following:            Patient/family informed of bed offers received.  Patient chooses bed at       Physician recommends and patient chooses bed at      Patient to be transferred to   on  .  Patient to be transferred to facility by       Patient family notified on   of transfer.  Name of family member notified:        PHYSICIAN       Additional Comment:    _______________________________________________ Standley Brooking, LCSW 09/18/2015, 3:56 PM

## 2015-09-19 DIAGNOSIS — G893 Neoplasm related pain (acute) (chronic): Secondary | ICD-10-CM | POA: Insufficient documentation

## 2015-09-19 DIAGNOSIS — A4101 Sepsis due to Methicillin susceptible Staphylococcus aureus: Secondary | ICD-10-CM

## 2015-09-19 DIAGNOSIS — R7989 Other specified abnormal findings of blood chemistry: Secondary | ICD-10-CM

## 2015-09-19 DIAGNOSIS — C50919 Malignant neoplasm of unspecified site of unspecified female breast: Secondary | ICD-10-CM

## 2015-09-19 DIAGNOSIS — B9562 Methicillin resistant Staphylococcus aureus infection as the cause of diseases classified elsewhere: Secondary | ICD-10-CM

## 2015-09-19 DIAGNOSIS — R7881 Bacteremia: Secondary | ICD-10-CM

## 2015-09-19 DIAGNOSIS — E43 Unspecified severe protein-calorie malnutrition: Secondary | ICD-10-CM

## 2015-09-19 LAB — CULTURE, BLOOD (ROUTINE X 2)
CULTURE: NO GROWTH
CULTURE: NO GROWTH

## 2015-09-19 LAB — PROTIME-INR
INR: 2.93 — AB (ref 0.00–1.49)
PROTHROMBIN TIME: 29.2 s — AB (ref 11.6–15.2)

## 2015-09-19 MED ORDER — AMOXICILLIN-POT CLAVULANATE 875-125 MG PO TABS: 1.0000 | ORAL_TABLET | Freq: Two times a day (BID) | ORAL | Status: DC

## 2015-09-19 MED ORDER — FENTANYL 50 MCG/HR TD PT72: 50.0000 ug | MEDICATED_PATCH | TRANSDERMAL | Status: DC

## 2015-09-19 MED ORDER — VANCOMYCIN HCL IN DEXTROSE 1-5 GM/200ML-% IV SOLN
1000.0000 mg | Freq: Once | INTRAVENOUS | Status: AC
  Administered 2015-09-19: 1000 mg via INTRAVENOUS
  Filled 2015-09-19: qty 200

## 2015-09-19 MED ORDER — FENTANYL 50 MCG/HR TD PT72
50.0000 ug | MEDICATED_PATCH | TRANSDERMAL | Status: DC
  Administered 2015-09-19 – 2015-09-22 (×2): 50 ug via TRANSDERMAL
  Filled 2015-09-19 (×2): qty 1

## 2015-09-19 MED ORDER — CEFAZOLIN SODIUM-DEXTROSE 2-4 GM/100ML-% IV SOLN
2.0000 g | Freq: Three times a day (TID) | INTRAVENOUS | Status: DC
  Administered 2015-09-19: 2 g via INTRAVENOUS
  Filled 2015-09-19 (×2): qty 100

## 2015-09-19 MED ORDER — VANCOMYCIN HCL 500 MG IV SOLR
500.0000 mg | Freq: Two times a day (BID) | INTRAVENOUS | Status: DC
  Administered 2015-09-20 – 2015-09-21 (×4): 500 mg via INTRAVENOUS
  Filled 2015-09-19 (×4): qty 500

## 2015-09-19 NOTE — Consult Note (Addendum)
Date of Admission:  09/14/2015  Date of Consult:  09/19/2015  Reason for Consult: N positive bacteremia Referring Physician: Dr. Sloan Leiter   HPI: [name] is an 70 y.o. 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 She would not ablate on own. She was also slightly confused. She was subsequently taken by friends to [facility] emergency room, where further evaluation revealed pneumonia. She was also found to have fever up 100.7 and initial systolic blood pressure in the 80s, she was given IV fluids with subsequent improvement in her blood pressure. She was subsequently transferred to [facility] for further evaluation and treatment.  Patient has had metastatic breast cancer and is followed closely by Dr. [name] at the cancer center, recently she was on palbociclib and letrozole which has now been stopped, with plans to start cyclophosphamide, methotrexate, and fluorouracil on 10/02/2015.  She had been treated with vancomycin and F Pima for 4 days then narrowed to cefepime and had improved rheumatically. She is going to be sent home on Augmentin to complete a course of therapy for pneumonia. In the interval however Dr. Sloan Leiter called her original hospital and blood cultures that were reportedly negative there are now found to be positive.  Talking with the patient today she states that she has new left-sided pain and her rib as well as pain in her right lower leg as well as neck pain.     Past Medical History  Diagnosis Date  . Cancer (HCC)     RBreast, Lower back , Under l Arm  . Hypertension   . GERD (gastroesophageal reflux disease)   . Breast cancer metastasized to bone ([place]) 04/30/2011   . Radiation 01/15/2012    Lumbosacral spine 3500 cGy 14  fx  . Swollen R ankle 08/10/2012  . Fracture of ankle, medial malleolus, right, closed 08/10/2012  . Tubular adenoma 2011  . Hiatal hernia 2011  . Schatzki's ring   . Anorexia 03/02/2013  . Weight loss due to medication 03/02/2013  . C. difficile colitis   . Pneumonia   . Metastasis ([place])     to skull    Past Surgical History  Procedure Laterality Date  . Gallbladder surgery  1991  . Mastectomy  06/28/10    bilateral- Dr. Rosebud Poles, [place]  . Inguinal hernia repair  90's    right  . Parathyroidectomy  90's  . Carpal tunnel release  90's  . Oophorectomy  90's    right  . Lymph node dissection    . Breast surgery    . Cholecystectomy    . Colonoscopy  01/08/10    Dr. [name]- anal tag,hemorrhoid o/w normal rectum, pancolonic diverticula, diminutive polyp in the base of the cecum= tubular adenoma on bx. poor prep  . Esophagogastroduodenoscopy  01/08/10    Dr. [name]- normal esophagus, small hiatal hernia, antum and body erosions, pedunculated polyp between D1 and D2, duodenal diverticulum, lymphangiectasia, second portion of the duodenum.stomach bx= inflammation, duodenum bx= adenoma  . Rotator cuff repair  08/27/11    Dr. Alphonzo Cruise- [facility]- [place] [facility]  . Esophagogastroduodenoscopy (egd) with propofol N/A 11/19/2012    RMR: non-critical Schatzki's ring s/p dilation with 66 F, multiple gastric polyps, duodenal polyp s/p piecemeal snare  polypectomy, gastric polypectomy, path benign  . Maloney dilation N/A 11/19/2012    Procedure: Venia Minks DILATION;  Surgeon: Daneil Dolin, MD;  Location: [facility];  Service: Endoscopy;  Laterality: N/A;  #54  . Polypectomy N/A 11/19/2012    Procedure: POLYPECTOMY;  Surgeon: Daneil Dolin, MD;  Location: [facility];  Service: Endoscopy;  Laterality: N/A;  duodenal  . Biopsy N/A 11/19/2012    Procedure: BIOPSY;  Surgeon: Daneil Dolin, MD;  Location: [facility];  Service: Endoscopy;  Laterality: N/A;  esophageal     Social History:  reports that she quit smoking about 32 years ago. She has never used smokeless tobacco. She reports that she does not drink alcohol or use illicit drugs.   Family History  Problem Relation Age of Onset  . Cancer Mother     Colon  . Cancer Father     Lung, Mouth,leg    Allergies  Allergen Reactions  . Aspirin Nausea And Vomiting  . Boost Pudding [Nutritional Supplements] Diarrhea  . Percodan [Oxycodone-Aspirin] Nausea Only  . Codeine Nausea Only     Medications: I have reviewed patients current medications as documented in Epic Anti-infectives    Start     Dose/Rate Route Frequency Ordered Stop   09/20/15 0600  vancomycin (VANCOCIN) 500 mg in sodium chloride 0.9 % 100 mL IVPB     500 mg 100 mL/hr over 60 Minutes Intravenous Every 12 hours 09/19/15 1212     09/19/15 1800  amoxicillin-clavulanate (AUGMENTIN) 875-125 MG per tablet 1 tablet  Status:  Discontinued     1 tablet Oral Every 12 hours 09/19/15 1106 09/19/15 1148   09/19/15 1400  ceFAZolin (ANCEF) IVPB 2g/100 mL premix     2 g 200 mL/hr over 30 Minutes Intravenous Every 8 hours 09/19/15 1211     09/19/15 1300  vancomycin (VANCOCIN) IVPB 1000 mg/200 mL premix     1,000 mg 200 mL/hr over 60 Minutes Intravenous  Once 09/19/15 1211 09/19/15 1325   09/15/15 0800  vancomycin (VANCOCIN) 500 mg in sodium chloride 0.9 % 100 mL IVPB  Status:  Discontinued     500 mg 100 mL/hr over 60 Minutes Intravenous Every 12 hours 09/14/15 1838 09/17/15 1106   09/15/15 0600  ceFEPIme (MAXIPIME) 1 g in dextrose 5 % 50 mL IVPB  Status:  Discontinued     1 g 100 mL/hr over 30 Minutes Intravenous Every 12 hours 09/14/15 1819 09/19/15 1105   09/14/15 2000  vancomycin (VANCOCIN) 1,250 mg in sodium chloride 0.9 % 250 mL IVPB     1,250 mg 166.7 mL/hr over 90 Minutes Intravenous  Once 09/14/15 1836 09/15/15 0026   09/14/15 1845  ceFEPIme (MAXIPIME) 2 g in dextrose 5 % 50 mL IVPB     2 g 100 mL/hr over 30 Minutes Intravenous   Once 09/14/15 1831 09/14/15 1918         ROS:as in HPI otherwise remainder of 12 point Review of Systems is + for depressive symptoms, pan in other areas that is not as specific but otherwise remainder of 12 point ROS is negative   Blood pressure 121/68, pulse 77, temperature 98 F (36.7 C), temperature source Oral, resp. rate 20, height 5\' 2"  (1.575 m), weight 131 lb 8 oz (59.648 kg), SpO2 95 %. General: Alert and awake, oriented x3, not in any acute distress. But dysphoric, irritable HEENT: anicteric sclera,  EOMI, oropharynx clear and without exudate Cardiovascular: regular rate, normal r,  no murmur rubs or gallops  Pulmonary: clear to auscultation bilaterally, no wheezing, rales or rhonchi Gastrointestinal: soft nontender, nondistended, normal bowel sounds, Musculoskeletal:  Pain with palpatio of her left chest wall as well as her right calf Skin, soft tissue: no rashes Neuro: nonfocal, strength and sensation intact   Results for orders placed or performed during the hospital encounter of 09/14/15 (from the past 48 hour(s))  Protime-INR     Status: Abnormal   Collection Time: 09/18/15  5:08 AM  Result Value Ref Range   Prothrombin Time 23.3 (H) 11.6 - 15.2 seconds   INR 2.17 (H) 0.00 - 1.49  Protime-INR     Status: Abnormal   Collection Time: 09/19/15  5:16 AM  Result Value Ref Range   Prothrombin Time 29.2 (H) 11.6 - 15.2 seconds   INR 2.93 (H) 0.00 - 1.49     ) Recent Results (from the past 720 hour(s))  Culture, blood (routine x 2)     Status: None   Collection Time: 09/14/15  6:35 PM  Result Value Ref Range Status   Specimen Description BLOOD RIGHT ARM  Final   Special Requests BOTTLES DRAWN AEROBIC AND ANAEROBIC [place]  Final   Culture   Final    NO GROWTH 5 DAYS Performed at [facility]    Report Status 09/19/2015 FINAL  Final  Culture, blood (routine x 2)     Status: None   Collection Time: 09/14/15  6:40 PM  Result Value Ref Range Status   Specimen  Description BLOOD LEFT HAND  Final   Special Requests BOTTLES DRAWN AEROBIC AND ANAEROBIC 5CC  Final   Culture   Final    NO GROWTH 5 DAYS Performed at [facility]    Report Status 09/19/2015 FINAL  Final     Impression/Recommendation  Principal Problem:   HCAP (healthcare-associated pneumonia) Active Problems:   Breast cancer metastasized to bone ([place])   Anorexia   Essential hypertension   Protein-calorie malnutrition, severe ([place])   SIRS (systemic inflammatory response syndrome) (HCC)   Encounter for palliative care   Generalized abdominal pain   Elevated LFTs   JADALISE DELAINE is a 70 y.o. female with  Metastatic breast cancer with mets to spine, skull other bones now with Gram positive bacteremia,   #1 Gram positive bacteremia:  -- I am going to call [place] again. Our lab could not get info from [facility] since they do not have the account number for [facility]!!!  --agree with vancomycin and cefazolin --she needs a TEE --it is a good thing she did not have portacath placed and I would not place PICC until we know she has been on effective continous abx and with repeat blood cutlures  #2 LE pain in calf: will get doppler to see if that is what is causing her pain  #3 Pain in ribs, neck and #2 could all be due to mets but with bacteremia worry about "metastatic disase" from bacteria esp if this is Staph aureus  09/19/2015, 4:32 PM   Thank you so much for this interesting consult  [place] for [place] (pager) [phone] (office) 09/19/2015, 4:32 PM  [place] 09/19/2015, 4:32 PM    ADDENDUM: SPOKE WITH [facility] IN [place] AND PT HAS MRSA IN BLOOD  --dc ANCEF  ---TEE needed unless we want to simply treat for 6 weeks with IV vancomycin ---repeat blood cultures after being on vancomycin for another 1-2 days and delay PICC placement until we  are sure those cultures are negative        [facility] Antimicrobial Management Team Staphylococcus aureus bacteremia   Staphylococcus aureus bacteremia (SAB) is associated with a high rate of complications and mortality.  Specific aspects of clinical management are critical to optimizing the outcome of patients with SAB.  Therefore, the [facility] Antimicrobial Management Team [facility]) has initiated an intervention aimed at improving the management of SAB at [facility].  To do so, Infectious Diseases physicians are providing an evidence-based consult for the management of all patients with SAB.     Yes No Comments  Perform follow-up blood cultures (even if the patient is afebrile) to ensure clearance of bacteremia [x]  []  Would repeat them again after she has been on 1-2 days of continuous vancomycin given the vancomycin was recently stopped   Remove vascular catheter and obtain follow-up blood cultures after the removal of the catheter []  []  Would not place a new PICC or long-term IV until we are sure that repeat blood cultures after 1-2 days of continuous vancomycin are negative   Perform echocardiography to evaluate for endocarditis (transthoracic ECHO is 40-50% sensitive, TEE is > 90% sensitive) []  []  Please keep in mind, that neither test can definitively EXCLUDE endocarditis, and that should clinical suspicion remain high for endocarditis the patient should then still be treated with an "endocarditis" duration of therapy = 6 weeks  Consider transesophageal echocardiogram versus weighing risks of doing this procedure in this patient with known spinal metastases versus simply giving her 6 week course of IV vancomycin   Consult electrophysiologist to evaluate implanted cardiac device (pacemaker, ICD) []  []  Not applicable   Ensure source control []  []  Have all abscesses been drained effectively? Have deep seeded infections (septic joints or osteomyelitis) had appropriate surgical debridement?  Not clear what source was   Investigate for  "metastatic" sites of infection []  []  Does the patient have ANY symptom or physical exam finding that would suggest a deeper infection (back or neck pain that may be suggestive of vertebral osteomyelitis or epidural abscess, muscle pain that could be a symptom of pyomyositis)?  Keep in mind that for deep seeded infections MRI imaging with contrast is preferred rather than other often insensitive tests such as plain x-rays, especially early in a patient's presentation.  We'll get Dopplers of her leg may consider CT of the chest to ensure no significant infection in the chest wall or the pleural fluid. Certainly much of her complaints are likely due to her metastatic cancer that is known to be involving multiple bones   Change antibiotic therapy to Vancomycin  []  []  Beta-lactam antibiotics are preferred for MSSA due to higher cure rates.   If on Vancomycin, goal trough should be 15 - 20 mcg/mL  Estimated duration of IV antibiotic therapy:  6 weeks []  []  Consult case management for probably prolonged outpatient IV antibiotic therapy

## 2015-09-19 NOTE — Progress Notes (Signed)
Pharmacy Antibiotic Note  [name] is a 70 y.o. female with history of metastatic breast cancer, admitted with healthcare associated pneumonia, generalized weakness and generalized bony pain.Preliminary blood cultures done at [facility] on 5/18 are positive for gm + cocci (2 out of 2 sets-still awaiting final ID). Pharmacy has now been consulted for vancomycin and cefazolin dosing.  Plan:  Cefazolin 2g IV q8h  Vancomycin 1g IV x 1, then 500mg  IV q12h Check trough at steady state, goal 15-20 mcg/ml Monitor renal function, final culture data Follow up ID recommendations  Height: 5\' 2"  (157.5 cm) Weight: 131 lb 8 oz (59.648 kg) IBW/kg (Calculated) : 50.1  Temp (24hrs), Avg:98.8 F (37.1 C), Min:98.5 F (36.9 C), Max:99.4 F (37.4 C)   Recent Labs Lab 09/15/15 0521 09/16/15 0532 09/17/15 0530  WBC 5.9  --  7.4  CREATININE 0.76 0.62 0.64    Estimated Creatinine Clearance: 51 mL/min (by C-G formula based on Cr of 0.64).    Allergies  Allergen Reactions  . Aspirin Nausea And Vomiting  . Boost Pudding [Nutritional Supplements] Diarrhea  . Percodan [Oxycodone-Aspirin] Nausea Only  . Codeine Nausea Only    Antimicrobials this admission: 5/18 Cefepime >> 5/23 5/18 Vancomycin >> 5/21, resume 5/23 >> 5/23 Ancef >>  Levels/dose changes this admission:  Microbiology results: 5/18 BCx: 2/2 GPC per report 5/18 BCx x2: NGTD 5/18 HIV antb: NR 5/18 ur strep pneu: neg 5/18 ur legionella: neg  Thank you for allowing pharmacy to be a part of this patient's care.  Peggyann Juba, PharmD, BCPS Pager: [phone] 09/19/2015 12:14 PM

## 2015-09-19 NOTE — Progress Notes (Signed)
ANTICOAGULATION CONSULT NOTE - Follow-Up  Pharmacy Consult for Warfarin Indication: hx extensive superficial vein thrombosis  Allergies  Allergen Reactions  . Aspirin Nausea And Vomiting  . Boost Pudding [Nutritional Supplements] Diarrhea  . Percodan [Oxycodone-Aspirin] Nausea Only  . Codeine Nausea Only   Patient Measurements: Height: 5\' 2"  (157.5 cm) Weight: 131 lb 8 oz (59.648 kg) IBW/kg (Calculated) : 50.1  Vital Signs: Temp: 98.5 F (36.9 C) (05/23 0439) Temp Source: Oral (05/23 0439) BP: 141/99 mmHg (05/23 0439) Pulse Rate: 69 (05/23 0439)  Labs:  Recent Labs  09/17/15 0530 09/18/15 0508 09/19/15 0516  HGB 9.1*  --   --   HCT 27.6*  --   --   PLT 148*  --   --   LABPROT 19.0* 23.3* 29.2*  INR 1.66* 2.17* 2.93*  CREATININE 0.64  --   --     Estimated Creatinine Clearance: 51 mL/min (by C-G formula based on Cr of 0.64).  Medications:  Scheduled:  . ceFEPime (MAXIPIME) IV  1 g Intravenous Q12H  . dexamethasone  2 mg Oral Q12H  . docusate sodium  100 mg Oral Daily  . feeding supplement  1 Container Oral BID BM  . fentaNYL  25 mcg Transdermal Q72H  . lidocaine  1 patch Transdermal Q24H  . pantoprazole  40 mg Oral Daily  . polyethylene glycol  17 g Oral Daily  . Warfarin - Pharmacist Dosing Inpatient   Does not apply q1800   Assessment: 58 yoF with hx of mets breast cancer on letrozole+ palbocicib, denosumab with plan for PAC placement on 5/18 for chemo to start in June. She was also on warfarin PTA for hx extensive vein thrombosis. Outpatient records patient was supposed to hold her warfarin three days prior to [facility] procedure.  However,  her INR was supratherapeutic at 9 on 5/10 and patient was instructed to hold her warfarin at that time.  Patient has not gotten PAC yet. She presented to the ED on 5/18 with c/o fever, chills, cough and worsening weakness. INR was 1.90 on admission  Home warfarin regimen: 2 mg daily except 4 mg on FSS   Today, 09/19/2015: -  INR this AM is therapeutic @ 2.89, up from 2.17 on 5/22. - Most recent doses are 2 mg on 5/22 and 4 mg on 5/21  - CBC: Hgb stble, pltc improved (112->148) - No bleeding documented - Drug-drug intxns: abx - Cardiac diet  Goal of Therapy:  INR 2-3 Monitor platelets by anticoagulation protocol: Yes   Plan:  - Hold warfarin today d/t sharp upward trend of INR over past day (2.17 to 2.89) which may continue to rise. - Please advise if/when warfarin needs to be held, if to proceed with port-a-cath placement with this admission  - per notes this appears will be delayed d/t fever - Daily INR - Monitor for s/s bleeding  Myrna Blazer, PharmD Candidate  09/19/2015 7:51 AM

## 2015-09-19 NOTE — Progress Notes (Signed)
Daily Progress Note   Patient Name: [name]       Date: 09/19/2015 DOB: [birth year]  Age: 70 y.o. MRN#: [id number] Attending Physician: Jonetta Osgood, MD Primary Care Physician: Curlene Labrum, MD Admit Date: 09/14/2015  Reason for Consultation/Follow-up: Pain control Life limiting illness: breast cancer, bone mets, admitted with SIRS and PNA Subjective:  Sitting in bed. In no distress.  Reports that she is feeling "OK" but being tired and not really wanting to talk today.  She reports that her pain regimen has been working fairly well.  Length of Stay: 5  Current Medications: Scheduled Meds:  . dexamethasone  2 mg Oral Q12H  . docusate sodium  100 mg Oral Daily  . feeding supplement  1 Container Oral BID BM  . fentaNYL  50 mcg Transdermal Q72H  . lidocaine  1 patch Transdermal Q24H  . pantoprazole  40 mg Oral Daily  . polyethylene glycol  17 g Oral Daily  . [START ON 09/20/2015] vancomycin  500 mg Intravenous Q12H  . Warfarin - Pharmacist Dosing Inpatient   Does not apply q1800    Continuous Infusions:    PRN Meds: gi cocktail, HYDROmorphone (DILAUDID) injection, magic mouthwash w/lidocaine, morphine, ondansetron (ZOFRAN) IV, polyvinyl alcohol, senna-docusate  Physical Exam         NAD S1 S2 Clear No edema Awake alert  Vital Signs: BP 116/73 mmHg  Pulse 77  Temp(Src) 98.3 F (36.8 C) (Oral)  Resp 20  Ht 5' 2"  (1.575 m)  Wt 59.648 kg (131 lb 8 oz)  BMI 24.05 kg/m2  SpO2 93% SpO2: SpO2: 93 % O2 Device: O2 Device: Not Delivered O2 Flow Rate: O2 Flow Rate (L/min): 2 L/min  Intake/output summary:   Intake/Output Summary (Last 24 hours) at 09/19/15 2345 Last data filed at 09/19/15 2300  Gross per 24 hour  Intake    650 ml  Output   1425 ml  Net    -775 ml   LBM: Last BM Date: 09/19/15 Baseline Weight: Weight: 59.648 kg (131 lb 8 oz) Most recent weight: Weight: 59.648 kg (131 lb 8 oz)       Palliative Assessment/Data:    Flowsheet Rows        Most Recent Value   Intake Tab    Referral Department  Hospitalist  Unit at Time of Referral  Oncology Unit   Palliative Care Primary Diagnosis  Cancer   Date Notified  09/16/15   Palliative Care Type  Return patient Palliative Care   Reason for referral  Pain   Date of Admission  09/14/15   Date first seen by Palliative Care  09/17/15   # of days Palliative referral response time  1 Day(s)   # of days IP prior to Palliative referral  2   Clinical Assessment    Palliative Performance Scale Score  50%   Pain Max last 24 hours  6   Pain Min Last 24 hours  4   Dyspnea Max Last 24 Hours  4   Dyspnea Min Last 24 hours  3   Nausea Max Last 24 Hours  3   Nausea Min Last 24 Hours  2   Psychosocial & Spiritual Assessment    Palliative Care Outcomes    Patient/Family meeting held?  Yes   Who was at the meeting?  patient herself   Palliative Care follow-up planned  Yes, Facility      Patient Active Problem List   Diagnosis Date Noted  . MRSA bacteremia   . Staphylococcus aureus bacteremia with sepsis ([place])   . Encounter for palliative care   . Generalized abdominal pain   . Elevated LFTs   . SIRS (systemic inflammatory response syndrome) ([place]) 09/14/2015  . Pain from bone metastases ([place]) 06/28/2015  . Cancer of overlapping sites of right breast ([place]) 05/11/2015  . Bilateral breast cancer ([place]) 05/11/2015  . C. difficile colitis 02/08/2015  . Protein-calorie malnutrition, severe ([place]) 01/25/2015  . Clostridium difficile diarrhea   . Hypokalemia 01/24/2015  . AKI (acute kidney injury) ([place]) 01/24/2015  . Essential hypertension 01/24/2015  . Abdominal pain   . Dehydration 09/09/2014  . HCAP (healthcare-associated pneumonia) 08/09/2014  . Cavitating mass in left upper lung  lobe 08/09/2014  . Anorexia 03/02/2013  . Weight loss due to medication 03/02/2013  . Swollen R ankle 08/10/2012  . Fracture of ankle, medial malleolus, right, closed 08/10/2012  . Rotator cuff tear 09/24/2011  . Breast cancer metastasized to bone ([place]) 04/30/2011  . Rash 03/01/2011  . Seroma, post-traumatic 12/25/2010  . RECTAL BLEEDING 12/08/2009  . FLATULENCE 12/08/2009  . OTH NONSPC ABN FINDNG RAD&OTH EXM BODY STRUCTURE 01/10/2009  . GERD 12/26/2008  . Osteoarthritis 12/26/2008  . Nausea without vomiting 12/26/2008  . Diarrhea 12/26/2008  . Personal history of thrombophlebitis 12/26/2008  . COLONIC POLYPS, HX OF 12/26/2008    Palliative Care Assessment & Plan   Assessment:  breast ca met dz to bone Uncontrolled pain Admitted for SIRS PNA   Recommendations/Plan:  Remains on antibiotics as per TRH. Fentanyl patch increased to 47mg/hr this afternoon.  Continue current pain regimen. Using less PRN PO MSIR over the past 24 hours.       Code Status:    Code Status Orders        Start     Ordered   09/14/15 1820  Full code   Continuous     09/14/15 1819    Code Status History    Date Active Date Inactive Code Status Order ID Comments User Context   01/24/2015  6:59 PM 01/28/2015  7:06 PM Full Code [id number] DWaldemar Dickens MD Inpatient   08/09/2014  1:31 PM 08/11/2014  7:05 PM Full Code [id number] MMelton Alar PA-C Inpatient    Advance Directive Documentation  Most Recent Value   Type of Advance Directive  Healthcare Power of Attorney, Living will   Pre-existing out of facility DNR order (yellow form or pink MOST form)     "MOST" Form in Place?         Prognosis:   Unable to determine but appears guarded, see above.   Discharge Planning:  To Be Determined  Care plan was discussed with  patient  Thank you for allowing the Palliative Medicine Team to assist in the care of this patient.   Time In: 1500 Time Out: 1520 Total Time 20 Prolonged  Time Billed  no       Greater than 50%  of this time was spent counseling and coordinating care related to the above assessment and plan.  Micheline Rough, MD [phone] Please contact Palliative Medicine Team phone at [phone] for questions and concerns.

## 2015-09-19 NOTE — Progress Notes (Signed)
PROGRESS NOTE        PATIENT DETAILS Name: [name] Age: 70 y.o. Sex: female Date of Birth: 05/29/1945 Admit Date: 09/14/2015 Admitting Physician Evalee Mutton Kristeen Mans, MD ZJ:[id number] E, MD  Brief Narrative: Patient is a 70 y.o. female with history of metastatic breast cancer, admitted with healthcare associated pneumonia, generalized weakness and generalized bony pain.Prelim blood cultures done at [facility] on 5/18 are positive for gm + cocci (2 out of 2 sets-still awaiting final ID)  Subjective: Continues to have diffuse bone pain today-remains afebrile  Assessment/Plan: Principal Problem: SIR's secondary to HCAP (healthcare-associated pneumonia) and Gram-positive bacteremia: Although clinically improved-now afebrile for >3 days.Main issues continue to be generalized weakness and diffuse pain. Blood cultures done here are negative, but spoke with lab at [facility] Gram stain is positive for gram-positive cocci in clusters, (RN had called a few days back-negative). I have restarted vancomycin, will consult infectious disease. Do not think patient needs any gram-negative coverage at this time, we'll discontinue cefepime. We will await results from [facility].  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 Unfortunately, her deconditioning has only worsened due to acute illness and pneumonia. PT evaluation completed, recommendations are for SNF-patient is agreeable.  Generalized/Diffuse Pain: suspect secondary to multiple osseous mets, although still persistent she is seems more comfortable today with Decadron, Fentanyl transdermal and prn IV Dilaudid and prn MSIR.will increase transdermal fentanyl to 50 g today. May need to be started on  bisphosphonates if pain is still an issue in the next few days. Appreciate palliative care evaluation.  Mildly elevated LFTs: Now normalized-likely due to acute infection, but given history of malignancy-liver metastases is a cause of concern. RUQ ultrasound no obvious metastatic lesions. Recent PET scan on 08/31/15 did not show any major hepatic lesions as well. For now continue to follow.  Epigastric/lower chest pain: better-described it as "indigestion"-continue PPI, and prn GI cocktail. EKG-no acute/new changes. Suspect no further work up required at this point-except following clinical course  Hypokalemia:repleted   Anemia: Likely secondary to chronic disease-worsened by acute illness, no evidence of blood loss. Follow CBC  HTN: Continue to hold both Coreg and lisinopril-but pressure currently controlled without antihypertensives.  History of extensive superficial venous thrombosis: Maintained on coumadin at home-continue per pharmacy  History of metastatic breast cancer: Recent PET scan on 08/31/15 showed numerous osseous mets-followed by Dr [name]. Recent MRI entire spine on 4/28 showed multiple osseous spinal mets with no cord compression.Obviously with fever/PNA-plans for chemo and port placement will need to be postponed.Seen by Oncology on 5/22-plans are to start  cyclophosphamide, methotrexate, and fluorouracil on 10/02/2015  Anorexia: Likely secondary to  underlying malignancy worsened by pneumonia. Continue IV Abx, if no improvement, will start Marinol  Protein-calorie malnutrition, severe: Continue supplements, await nutrition evaluation.  DVT Prophylaxis: Full dose anticoagulation on Coumadin  Code Status: Full code   Family Communication: None at bedside  Disposition Plan: Remain inpatient-need several more days of hospitalization prior to discharge  Antimicrobial agents: IV Vancomycin 5/18>>5/21 IV Cefepime 5/18>>  Procedures: Echo 5/19>>EF 50-55%  CONSULTS:   None  Time spent: 25 minutes-Greater than 50% of this time was spent in counseling, explanation of diagnosis, planning of further management,  and coordination of care.  MEDICATIONS: Anti-infectives    Start     Dose/Rate Route Frequency Ordered Stop   09/19/15 1800  amoxicillin-clavulanate (AUGMENTIN) 875-125 MG per tablet 1 tablet  Status:  Discontinued     1 tablet Oral Every 12 hours 09/19/15 1106 09/19/15 1148   09/15/15 0800  vancomycin (VANCOCIN) 500 mg in sodium chloride 0.9 % 100 mL IVPB  Status:  Discontinued     500 mg 100 mL/hr over 60 Minutes Intravenous Every 12 hours 09/14/15 1838 09/17/15 1106   09/15/15 0600  ceFEPIme (MAXIPIME) 1 g in dextrose 5 % 50 mL IVPB  Status:  Discontinued     1 g 100 mL/hr over 30 Minutes Intravenous Every 12 hours 09/14/15 1819 09/19/15 1105   09/14/15 2000  vancomycin (VANCOCIN) 1,250 mg in sodium chloride 0.9 % 250 mL IVPB     1,250 mg 166.7 mL/hr over 90 Minutes Intravenous  Once 09/14/15 1836 09/15/15 0026   09/14/15 1845  ceFEPIme (MAXIPIME) 2 g in dextrose 5 % 50 mL IVPB     2 g 100 mL/hr over 30 Minutes Intravenous  Once 09/14/15 1831 09/14/15 1918      Scheduled Meds: . dexamethasone  2 mg Oral Q12H  . docusate sodium  100 mg Oral Daily  . feeding supplement  1 Container Oral BID BM  . fentaNYL  25 mcg Transdermal Q72H  . lidocaine  1 patch Transdermal Q24H  . pantoprazole  40 mg Oral Daily  . polyethylene glycol  17 g Oral Daily  . Warfarin - Pharmacist Dosing Inpatient   Does not apply q1800   Continuous Infusions:  PRN Meds:.gi cocktail, HYDROmorphone (DILAUDID) injection, magic mouthwash w/lidocaine, morphine, ondansetron (ZOFRAN) IV, polyvinyl alcohol, senna-docusate   PHYSICAL EXAM: Vital signs: Filed Vitals:   09/18/15 0521 09/18/15 1439 09/18/15 2038 09/19/15 0439  BP: 134/68 118/66 122/90 141/99  Pulse: 75 80 94 69  Temp: 98 F (36.7 C) 98.5 F (36.9 C) 99.4 F (37.4 C) 98.5 F (36.9 C)  TempSrc: Oral Oral  Oral Oral  Resp: 20 20 22 20   Height:      Weight:      SpO2: 91% 94% 90% 94%   Filed Weights   09/14/15 1730  Weight: 59.648 kg (131 lb 8 oz)   Body mass index is 24.05 kg/(m^2).   Gen Exam: Awake and alert with clear speech.Chronically sick looking. Neck: Supple, No JVD.   Chest: B/L Clear.   CVS: S1 S2 Regular, no murmurs.  Abdomen: soft, BS +, mildly tender in epigastric area- non distended.  Extremities: no edema, lower extremities warm to touch. Neurologic: Non Focal-but exam limited due to pain. Skin: No Rash or lesions   Wounds: N/A.    LABORATORY DATA: CBC:  Recent Labs Lab 09/15/15 0521 09/17/15 0530  WBC 5.9 7.4  HGB 8.7* 9.1*  HCT 26.7* 27.6*  MCV 102.7* 103.0*  PLT 112* 148*    Basic Metabolic Panel:  Recent Labs Lab 09/15/15 0521 09/16/15 0532 09/17/15 0530  NA 141 138 139  K 3.3* 3.1* 3.8  CL 109 103 100*  CO2 24 27 28   GLUCOSE 75 101* 149*  BUN 25* 18 26*  CREATININE 0.76 0.62 0.64  CALCIUM 7.8* 8.6* 9.0    GFR: Estimated Creatinine Clearance: 51 mL/min (by C-G formula based on Cr of 0.64).  Liver Function Tests:  Recent Labs Lab 09/15/15 0521 09/16/15 0532  AST 70* 38  ALT 69* 54  ALKPHOS 153* 142*  BILITOT 1.4* 0.8  PROT 5.3* 4.9*  ALBUMIN 2.1* 1.9*   No results for input(s): LIPASE, AMYLASE in the last 168 hours. No results for input(s): AMMONIA in the last 168 hours.  Coagulation Profile:  Recent Labs Lab 09/15/15 0521 09/16/15 0532 09/17/15 0530 09/18/15 0508 09/19/15 0516  INR 2.54* 1.95* 1.66* 2.17* 2.93*    Cardiac Enzymes: No results for input(s): CKTOTAL, CKMB, CKMBINDEX, TROPONINI in the last 168 hours.  BNP (last 3 results) No results for input(s): PROBNP in the last 8760 hours.  HbA1C: No results for input(s): HGBA1C in the last 72 hours.  CBG: No results for input(s): GLUCAP in the last 168 hours.  Lipid Profile: No results for input(s): CHOL, HDL, LDLCALC, TRIG, CHOLHDL, LDLDIRECT in the  last 72 hours.  Thyroid Function Tests: No results for input(s): TSH, T4TOTAL, FREET4, T3FREE, THYROIDAB in the last 72 hours.  Anemia Panel: No results for input(s): VITAMINB12, FOLATE, FERRITIN, TIBC, IRON, RETICCTPCT in the last 72 hours.  Urine analysis:    Component Value Date/Time   COLORURINE YELLOW 09/14/2015 1834   APPEARANCEUR CLOUDY* 09/14/2015 1834   LABSPEC 1.013 09/14/2015 1834   PHURINE 5.5 09/14/2015 1834   GLUCOSEU NEGATIVE 09/14/2015 1834   HGBUR LARGE* 09/14/2015 1834   BILIRUBINUR NEGATIVE 09/14/2015 1834   KETONESUR NEGATIVE 09/14/2015 1834   PROTEINUR 30* 09/14/2015 1834   UROBILINOGEN 0.2 01/23/2015 1830   NITRITE POSITIVE* 09/14/2015 1834   LEUKOCYTESUR NEGATIVE 09/14/2015 1834    Sepsis Labs: Lactic Acid, Venous No results found for: LATICACIDVEN  MICROBIOLOGY: Recent Results (from the past 240 hour(s))  Culture, blood (routine x 2)     Status: None (Preliminary result)   Collection Time: 09/14/15  6:35 PM  Result Value Ref Range Status   Specimen Description BLOOD RIGHT ARM  Final   Special Requests BOTTLES DRAWN AEROBIC AND ANAEROBIC 6CC  Final   Culture   Final    NO GROWTH 4 DAYS Performed at [facility]    Report Status PENDING  Incomplete  Culture, blood (routine x 2)     Status: None (Preliminary result)   Collection Time: 09/14/15  6:40 PM  Result Value Ref Range Status   Specimen Description BLOOD LEFT HAND  Final   Special Requests BOTTLES DRAWN AEROBIC AND ANAEROBIC 5CC  Final   Culture   Final    NO GROWTH 4 DAYS Performed at [name]    Report Status PENDING  Incomplete    RADIOLOGY STUDIES/RESULTS: Mr Kizzie Fantasia Contrast  08/25/2015  CLINICAL DATA:  Ongoing headache. Arm pain and pain beneath the sternum and ribs for 2 weeks. Right leg pain. Metastatic breast cancer. EXAM: MRI HEAD WITHOUT AND WITH CONTRAST; MRI CERVICAL SPINE WITHOUT AND WITH CONTRAST; MRI LUMBAR SPINE WITHOUT AND WITH CONTRAST; MRI  THORACIC SPINE WITHOUT AND WITH CONTRAST TECHNIQUE: Multiplanar, multiecho pulse sequences of the brain and surrounding structures were obtained according to standard protocol without and with intravenous contrast; Multiplanar and multiecho pulse sequences of the cervical spine, to include the craniocervical junction and cervicothoracic junction, were obtained according to standard protocol without and with intravenous contrast.; Multiplanar and multiecho pulse sequences of the lumbar spine were obtained without and with intravenous contrast.; Multiplanar and multiecho pulse sequences of the thoracic spine were obtained without and with intravenous contrast. CONTRAST:  87mL MULTIHANCE GADOBENATE DIMEGLUMINE 529 MG/ML IV SOLN COMPARISON:  Head CT 08/05/2015 and MRI 02/14/2009. Cervical spine MRI 05/14/2013. Thoracic and lumbar spine MRI 07/05/2015. FINDINGS: MR  HEAD FINDINGS: There is no evidence of acute infarct, intracranial hemorrhage, mass, midline shift, or extra-axial fluid collection. There is mild cerebral atrophy. Small foci of T2 hyperintensity in the cerebral white matter bilaterally, stable to slightly increased from 2010 and nonspecific but compatible with mild-to-moderate chronic small vessel ischemic disease. No enhancing brain lesions are identified. There is at most minimally prominent dural enhancement without nodularity. Small enhancing lesions are present throughout the skull, progressive from 2010 (for example 3 cm left parietal lesion on image 28 of series 41). There is involvement of the clivus. Prior bilateral cataract extraction is noted. There is a trace left mastoid effusion. No significant paranasal sinus inflammatory disease. Major intracranial vascular flow voids are preserved. MR CERVICAL SPINE FINDINGS: Cervical spine straightening is unchanged. There is no significant listhesis. There is progressive osseous metastatic disease throughout the cervical spine since the 2015 MRI. There is  now complete marrow replacement involving the C4 vertebral body with patchy enhancement including a 1 cm focus posteriorly. There is also a left-sided posterior element involvement at C4 which is new. No epidural tumor or spinal cord compression is identified. No cervical spinal cord signal abnormality is identified within limitations of motion artifact. Diffuse cervical disc degeneration is again seen. There is mild neural foraminal stenosis on the right at C3-4, on the left at C4-5, and likely bilaterally at C5-6 and C6-7. This is overall similar to the prior MRI. No significant spinal stenosis is seen. The paraspinal soft tissues are unremarkable. MR THORACIC SPINE FINDINGS: Diffuse osseous metastatic disease is again seen throughout the vertebral bodies and posterior elements of the thoracic spine. There has overall been mild progression from the 07/05/2015 thoracic spine MRI. There is now near complete involvement of the T10 vertebral body. There is mildly progressive marrow replacement involving multiple mid thoracic vertebral bodies as well as T1 and T2. The T3 vertebral body remains completely involved. There is a mild T12 superior endplate compression fracture which demonstrates slightly progressive height loss compared to the prior study. There is slightly prominent ventral epidural enhancement in the mid thoracic spine from T5 -T8, most conspicuous and mildly nodular in a left paracentral location at the T8 superior endplate level (series 37, image 33). This may reflect very small volume epidural tumor, prominent epidural venous plexus, or a combination of both. This has likely not significantly changed from the prior thoracic spine MRI, although there is much more motion artifact on axial sequences on that study. The spinal cord is normal in caliber and signal. There is no spinal cord compression. Multilevel disc degeneration is again seen with disc bulging and small disc protrusions without significant  spinal canal or neural foraminal stenosis. Small left adrenal nodule and mild left upper pole renal caliectasis or cysts are partially visualized, grossly similar to the 09/07/2014 PET-CT. MR LUMBAR SPINE FINDINGS: Vertebral alignment is unchanged, with trace anterolisthesis again seen of L3 on L4. Osseous lesions throughout the lumbar spine do not appear significantly changed from the prior lumbar spine MRI, with the largest lesion in the L1 vertebral body. S3 level sacral osseous metastatic disease is partially visualized and grossly similar to the prior study. 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 Lumbar vertebral body heights are preserved. There is diffuse lumbar disc desiccation. Severe disc space height loss at L4-5 is unchanged. Multilevel disc and facet degeneration does not appear significantly changed, with mild right foraminal stenosis again seen at L3-4. There is no spinal stenosis. No epidural tumor is identified. Conus  medullaris is normal in signal and terminates at L1. IMPRESSION: 1. No acute intracranial abnormality. No evidence of brain metastases. 2. Numerous osseous skull metastases, progressed from 2010. 3. Widespread spinal osseous metastatic disease, with interval progression in the cervical and thoracic spine. No sizable epidural tumor identified. Slightly prominent ventral epidural enhancement in the mid thoracic spine is favored to mostly reflect epidural venous plexus, however trace epidural tumor is possible particularly at T8. 4. No spinal stenosis. 5. Mild T12 superior endplate compression fracture with slightly progressive height loss from 07/05/2015. Electronically Signed   By: Logan Bores M.D.   On: 08/25/2015 17:08   Mr Cervical Spine W Wo Contrast  08/25/2015  CLINICAL DATA:  Ongoing headache. Arm pain and pain beneath the sternum and ribs for 2 weeks. Right leg pain. Metastatic breast cancer. EXAM: MRI HEAD WITHOUT AND WITH CONTRAST; MRI CERVICAL SPINE WITHOUT  AND WITH CONTRAST; MRI LUMBAR SPINE WITHOUT AND WITH CONTRAST; MRI THORACIC SPINE WITHOUT AND WITH CONTRAST TECHNIQUE: Multiplanar, multiecho pulse sequences of the brain and surrounding structures were obtained according to standard protocol without and with intravenous contrast; Multiplanar and multiecho pulse sequences of the cervical spine, to include the craniocervical junction and cervicothoracic junction, were obtained according to standard protocol without and with intravenous contrast.; Multiplanar and multiecho pulse sequences of the lumbar spine were obtained without and with intravenous contrast.; Multiplanar and multiecho pulse sequences of the thoracic spine were obtained without and with intravenous contrast. CONTRAST:  63mL MULTIHANCE GADOBENATE DIMEGLUMINE 529 MG/ML IV SOLN COMPARISON:  Head CT 08/05/2015 and MRI 02/14/2009. Cervical spine MRI 05/14/2013. Thoracic and lumbar spine MRI 07/05/2015. FINDINGS: MR HEAD FINDINGS: There is no evidence of acute infarct, intracranial hemorrhage, mass, midline shift, or extra-axial fluid collection. There is mild cerebral atrophy. Small foci of T2 hyperintensity in the cerebral white matter bilaterally, stable to slightly increased from 2010 and nonspecific but compatible with mild-to-moderate chronic small vessel ischemic disease. No enhancing brain lesions are identified. There is at most minimally prominent dural enhancement without nodularity. Small enhancing lesions are present throughout the skull, progressive from 2010 (for example 3 cm left parietal lesion on image 28 of series 41). There is involvement of the clivus. Prior bilateral cataract extraction is noted. There is a trace left mastoid effusion. No significant paranasal sinus inflammatory disease. Major intracranial vascular flow voids are preserved. MR CERVICAL SPINE FINDINGS: Cervical spine straightening is unchanged. There is no significant listhesis. There is progressive osseous metastatic  disease throughout the cervical spine since the 2015 MRI. There is now complete marrow replacement involving the C4 vertebral body with patchy enhancement including a 1 cm focus posteriorly. There is also a left-sided posterior element involvement at C4 which is new. No epidural tumor or spinal cord compression is identified. No cervical spinal cord signal abnormality is identified within limitations of motion artifact. Diffuse cervical disc degeneration is again seen. There is mild neural foraminal stenosis on the right at C3-4, on the left at C4-5, and likely bilaterally at C5-6 and C6-7. This is overall similar to the prior MRI. No significant spinal stenosis is seen. The paraspinal soft tissues are unremarkable. MR THORACIC SPINE FINDINGS: Diffuse osseous metastatic disease is again seen throughout the vertebral bodies and posterior elements of the thoracic spine. There has overall been mild progression from the 07/05/2015 thoracic spine MRI. There is now near complete involvement of the T10 vertebral body. There is mildly progressive marrow replacement involving multiple mid thoracic vertebral bodies as well as T1  and T2. The T3 vertebral body remains completely involved. There is a mild T12 superior endplate compression fracture which demonstrates slightly progressive height loss compared to the prior study. There is slightly prominent ventral epidural enhancement in the mid thoracic spine from T5 -T8, most conspicuous and mildly nodular in a left paracentral location at the T8 superior endplate level (series 37, image 33). This may reflect very small volume epidural tumor, prominent epidural venous plexus, or a combination of both. This has likely not significantly changed from the prior thoracic spine MRI, although there is much more motion artifact on axial sequences on that study. The spinal cord is normal in caliber and signal. There is no spinal cord compression. Multilevel disc degeneration is again  seen with disc bulging and small disc protrusions without significant spinal canal or neural foraminal stenosis. Small left adrenal nodule and mild left upper pole renal caliectasis or cysts are partially visualized, grossly similar to the 09/07/2014 PET-CT. MR LUMBAR SPINE FINDINGS: Vertebral alignment is unchanged, with trace anterolisthesis again seen of L3 on L4. Osseous lesions throughout the lumbar spine do not appear significantly changed from the prior lumbar spine MRI, with the largest lesion in the L1 vertebral body. S3 level sacral osseous metastatic disease is partially visualized and grossly similar to the prior study. 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 Lumbar vertebral body heights are preserved. There is diffuse lumbar disc desiccation. Severe disc space height loss at L4-5 is unchanged. Multilevel disc and facet degeneration does not appear significantly changed, with mild right foraminal stenosis again seen at L3-4. There is no spinal stenosis. No epidural tumor is identified. Conus medullaris is normal in signal and terminates at L1. IMPRESSION: 1. No acute intracranial abnormality. No evidence of brain metastases. 2. Numerous osseous skull metastases, progressed from 2010. 3. Widespread spinal osseous metastatic disease, with interval progression in the cervical and thoracic spine. No sizable epidural tumor identified. Slightly prominent ventral epidural enhancement in the mid thoracic spine is favored to mostly reflect epidural venous plexus, however trace epidural tumor is possible particularly at T8. 4. No spinal stenosis. 5. Mild T12 superior endplate compression fracture with slightly progressive height loss from 07/05/2015. Electronically Signed   By: Logan Bores M.D.   On: 08/25/2015 17:08   Mr Thoracic Spine W Wo Contrast  08/25/2015  CLINICAL DATA:  Ongoing headache. Arm pain and pain beneath the sternum and ribs for 2 weeks. Right leg pain. Metastatic breast cancer.  EXAM: MRI HEAD WITHOUT AND WITH CONTRAST; MRI CERVICAL SPINE WITHOUT AND WITH CONTRAST; MRI LUMBAR SPINE WITHOUT AND WITH CONTRAST; MRI THORACIC SPINE WITHOUT AND WITH CONTRAST TECHNIQUE: Multiplanar, multiecho pulse sequences of the brain and surrounding structures were obtained according to standard protocol without and with intravenous contrast; Multiplanar and multiecho pulse sequences of the cervical spine, to include the craniocervical junction and cervicothoracic junction, were obtained according to standard protocol without and with intravenous contrast.; Multiplanar and multiecho pulse sequences of the lumbar spine were obtained without and with intravenous contrast.; Multiplanar and multiecho pulse sequences of the thoracic spine were obtained without and with intravenous contrast. CONTRAST:  35mL MULTIHANCE GADOBENATE DIMEGLUMINE 529 MG/ML IV SOLN COMPARISON:  Head CT 08/05/2015 and MRI 02/14/2009. Cervical spine MRI 05/14/2013. Thoracic and lumbar spine MRI 07/05/2015. FINDINGS: MR HEAD FINDINGS: There is no evidence of acute infarct, intracranial hemorrhage, mass, midline shift, or extra-axial fluid collection. There is mild cerebral atrophy. Small foci of T2 hyperintensity in the cerebral white matter bilaterally, stable to  slightly increased from 2010 and nonspecific but compatible with mild-to-moderate chronic small vessel ischemic disease. No enhancing brain lesions are identified. There is at most minimally prominent dural enhancement without nodularity. Small enhancing lesions are present throughout the skull, progressive from 2010 (for example 3 cm left parietal lesion on image 28 of series 41). There is involvement of the clivus. Prior bilateral cataract extraction is noted. There is a trace left mastoid effusion. No significant paranasal sinus inflammatory disease. Major intracranial vascular flow voids are preserved. MR CERVICAL SPINE FINDINGS: Cervical spine straightening is unchanged. There  is no significant listhesis. There is progressive osseous metastatic disease throughout the cervical spine since the 2015 MRI. There is now complete marrow replacement involving the C4 vertebral body with patchy enhancement including a 1 cm focus posteriorly. There is also a left-sided posterior element involvement at C4 which is new. No epidural tumor or spinal cord compression is identified. No cervical spinal cord signal abnormality is identified within limitations of motion artifact. Diffuse cervical disc degeneration is again seen. There is mild neural foraminal stenosis on the right at C3-4, on the left at C4-5, and likely bilaterally at C5-6 and C6-7. This is overall similar to the prior MRI. No significant spinal stenosis is seen. The paraspinal soft tissues are unremarkable. MR THORACIC SPINE FINDINGS: Diffuse osseous metastatic disease is again seen throughout the vertebral bodies and posterior elements of the thoracic spine. There has overall been mild progression from the 07/05/2015 thoracic spine MRI. There is now near complete involvement of the T10 vertebral body. There is mildly progressive marrow replacement involving multiple mid thoracic vertebral bodies as well as T1 and T2. The T3 vertebral body remains completely involved. There is a mild T12 superior endplate compression fracture which demonstrates slightly progressive height loss compared to the prior study. There is slightly prominent ventral epidural enhancement in the mid thoracic spine from T5 -T8, most conspicuous and mildly nodular in a left paracentral location at the T8 superior endplate level (series 37, image 33). This may reflect very small volume epidural tumor, prominent epidural venous plexus, or a combination of both. This has likely not significantly changed from the prior thoracic spine MRI, although there is much more motion artifact on axial sequences on that study. The spinal cord is normal in caliber and signal. There is  no spinal cord compression. Multilevel disc degeneration is again seen with disc bulging and small disc protrusions without significant spinal canal or neural foraminal stenosis. Small left adrenal nodule and mild left upper pole renal caliectasis or cysts are partially visualized, grossly similar to the 09/07/2014 PET-CT. MR LUMBAR SPINE FINDINGS: Vertebral alignment is unchanged, with trace anterolisthesis again seen of L3 on L4. Osseous lesions throughout the lumbar spine do not appear significantly changed from the prior lumbar spine MRI, with the largest lesion in the L1 vertebral body. S3 level sacral osseous metastatic disease is partially visualized and grossly similar to the prior study. 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 Lumbar vertebral body heights are preserved. There is diffuse lumbar disc desiccation. Severe disc space height loss at L4-5 is unchanged. Multilevel disc and facet degeneration does not appear significantly changed, with mild right foraminal stenosis again seen at L3-4. There is no spinal stenosis. No epidural tumor is identified. Conus medullaris is normal in signal and terminates at L1. IMPRESSION: 1. No acute intracranial abnormality. No evidence of brain metastases. 2. Numerous osseous skull metastases, progressed from 2010. 3. Widespread spinal osseous metastatic disease, with interval  progression in the cervical and thoracic spine. No sizable epidural tumor identified. Slightly prominent ventral epidural enhancement in the mid thoracic spine is favored to mostly reflect epidural venous plexus, however trace epidural tumor is possible particularly at T8. 4. No spinal stenosis. 5. Mild T12 superior endplate compression fracture with slightly progressive height loss from 07/05/2015. Electronically Signed   By: Logan Bores M.D.   On: 08/25/2015 17:08   Mr Lumbar Spine W Wo Contrast  08/25/2015  CLINICAL DATA:  Ongoing headache. Arm pain and pain beneath the sternum  and ribs for 2 weeks. Right leg pain. Metastatic breast cancer. EXAM: MRI HEAD WITHOUT AND WITH CONTRAST; MRI CERVICAL SPINE WITHOUT AND WITH CONTRAST; MRI LUMBAR SPINE WITHOUT AND WITH CONTRAST; MRI THORACIC SPINE WITHOUT AND WITH CONTRAST TECHNIQUE: Multiplanar, multiecho pulse sequences of the brain and surrounding structures were obtained according to standard protocol without and with intravenous contrast; Multiplanar and multiecho pulse sequences of the cervical spine, to include the craniocervical junction and cervicothoracic junction, were obtained according to standard protocol without and with intravenous contrast.; Multiplanar and multiecho pulse sequences of the lumbar spine were obtained without and with intravenous contrast.; Multiplanar and multiecho pulse sequences of the thoracic spine were obtained without and with intravenous contrast. CONTRAST:  59mL MULTIHANCE GADOBENATE DIMEGLUMINE 529 MG/ML IV SOLN COMPARISON:  Head CT 08/05/2015 and MRI 02/14/2009. Cervical spine MRI 05/14/2013. Thoracic and lumbar spine MRI 07/05/2015. FINDINGS: MR HEAD FINDINGS: There is no evidence of acute infarct, intracranial hemorrhage, mass, midline shift, or extra-axial fluid collection. There is mild cerebral atrophy. Small foci of T2 hyperintensity in the cerebral white matter bilaterally, stable to slightly increased from 2010 and nonspecific but compatible with mild-to-moderate chronic small vessel ischemic disease. No enhancing brain lesions are identified. There is at most minimally prominent dural enhancement without nodularity. Small enhancing lesions are present throughout the skull, progressive from 2010 (for example 3 cm left parietal lesion on image 28 of series 41). There is involvement of the clivus. Prior bilateral cataract extraction is noted. There is a trace left mastoid effusion. No significant paranasal sinus inflammatory disease. Major intracranial vascular flow voids are preserved. MR CERVICAL  SPINE FINDINGS: Cervical spine straightening is unchanged. There is no significant listhesis. There is progressive osseous metastatic disease throughout the cervical spine since the 2015 MRI. There is now complete marrow replacement involving the C4 vertebral body with patchy enhancement including a 1 cm focus posteriorly. There is also a left-sided posterior element involvement at C4 which is new. No epidural tumor or spinal cord compression is identified. No cervical spinal cord signal abnormality is identified within limitations of motion artifact. Diffuse cervical disc degeneration is again seen. There is mild neural foraminal stenosis on the right at C3-4, on the left at C4-5, and likely bilaterally at C5-6 and C6-7. This is overall similar to the prior MRI. No significant spinal stenosis is seen. The paraspinal soft tissues are unremarkable. MR THORACIC SPINE FINDINGS: Diffuse osseous metastatic disease is again seen throughout the vertebral bodies and posterior elements of the thoracic spine. There has overall been mild progression from the 07/05/2015 thoracic spine MRI. There is now near complete involvement of the T10 vertebral body. There is mildly progressive marrow replacement involving multiple mid thoracic vertebral bodies as well as T1 and T2. The T3 vertebral body remains completely involved. There is a mild T12 superior endplate compression fracture which demonstrates slightly progressive height loss compared to the prior study. There is slightly prominent ventral epidural enhancement  in the mid thoracic spine from T5 -T8, most conspicuous and mildly nodular in a left paracentral location at the T8 superior endplate level (series 37, image 33). This may reflect very small volume epidural tumor, prominent epidural venous plexus, or a combination of both. This has likely not significantly changed from the prior thoracic spine MRI, although there is much more motion artifact on axial sequences on that  study. The spinal cord is normal in caliber and signal. There is no spinal cord compression. Multilevel disc degeneration is again seen with disc bulging and small disc protrusions without significant spinal canal or neural foraminal stenosis. Small left adrenal nodule and mild left upper pole renal caliectasis or cysts are partially visualized, grossly similar to the 09/07/2014 PET-CT. MR LUMBAR SPINE FINDINGS: Vertebral alignment is unchanged, with trace anterolisthesis again seen of L3 on L4. Osseous lesions throughout the lumbar spine do not appear significantly changed from the prior lumbar spine MRI, with the largest lesion in the L1 vertebral body. S3 level sacral osseous metastatic disease is partially visualized and grossly similar to the prior study. 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 Lumbar vertebral body heights are preserved. There is diffuse lumbar disc desiccation. Severe disc space height loss at L4-5 is unchanged. Multilevel disc and facet degeneration does not appear significantly changed, with mild right foraminal stenosis again seen at L3-4. There is no spinal stenosis. No epidural tumor is identified. Conus medullaris is normal in signal and terminates at L1. IMPRESSION: 1. No acute intracranial abnormality. No evidence of brain metastases. 2. Numerous osseous skull metastases, progressed from 2010. 3. Widespread spinal osseous metastatic disease, with interval progression in the cervical and thoracic spine. No sizable epidural tumor identified. Slightly prominent ventral epidural enhancement in the mid thoracic spine is favored to mostly reflect epidural venous plexus, however trace epidural tumor is possible particularly at T8. 4. No spinal stenosis. 5. Mild T12 superior endplate compression fracture with slightly progressive height loss from 07/05/2015. Electronically Signed   By: Logan Bores M.D.   On: 08/25/2015 17:08   Nm Pet Image Restag (ps) Skull Base To  Thigh  08/31/2015  CLINICAL DATA:  Subsequent treatment strategy for metastatic breast carcinoma to bone. EXAM: NUCLEAR MEDICINE PET SKULL BASE TO THIGH TECHNIQUE: 6.3 mCi F-18 FDG was injected intravenously. Full-ring PET imaging was performed from the skull base to thigh after the radiotracer. CT data was obtained and used for attenuation correction and anatomic localization. FASTING BLOOD GLUCOSE:  Value: 103 mg/dl COMPARISON:  09/07/2014 FINDINGS: NECK No hypermetabolic lymph nodes in the neck. CHEST No hypermetabolic mediastinal or hilar nodes. No suspicious pulmonary nodules on the CT scan. Biapical pleural- parenchymal scarring appears stable. Previously seen area of hypermetabolic airspace disease in the left upper lobe has nearly completely resolved, consistent with resolving infectious or inflammatory process. ABDOMEN/PELVIS No abnormal hypermetabolic activity within the liver, pancreas, adrenal glands, or spleen. No hypermetabolic lymph nodes in the abdomen or pelvis. Tiny left hepatic lobe cyst remains stable. Prior cholecystectomy noted. 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 11 mm left adrenal nodule remains stable and shows no metabolic activity, consistent with benign adrenal adenoma. Colonic diverticulosis noted, without evidence of diverticulitis. SKELETON Mixed lytic and sclerotic bone metastases again seen within spine, pelvis, sternum, and proximal left femur. These appear stable and show no significant change in low-grade metabolic activity, consistent with largely treated osseous metastatic disease. IMPRESSION: Near complete resolution of hypermetabolic airspace disease in left upper lobe, consistent with resolving infectious or inflammatory  process. No significant change in appearance of largely treated osseous metastatic disease. No new or progressive metastatic disease identified. Electronically Signed   By: Earle Gell M.D.   On: 08/31/2015 12:48   Dg Chest Port 1 View  09/15/2015   CLINICAL DATA:  Short breath, cough, fever EXAM: PORTABLE CHEST 1 VIEW COMPARISON:  08/31/2015 FINDINGS: Normal cardiac silhouette with ectatic aorta. Increased density in the LEFT upper lobe. The biapical densities are not changed from CT. Small LEFT effusion. No pneumothorax. IMPRESSION: 1. Increasing density in the LEFT upper lobe. Concerning for edema or infection. Electronically Signed   By: Suzy Bouchard M.D.   On: 09/15/2015 07:26   US Abdomen Limited Ruq  09/15/2015  CLINICAL DATA:  Elevated liver function tests. EXAM: US ABDOMEN LIMITED - RIGHT UPPER QUADRANT COMPARISON:  08/31/2015, PET-CT. FINDINGS: Gallbladder: Surgically absent Common bile duct: Diameter: Dilated to a maximum of 17 mm, unchanged from the recent prior PET-CT. No sonographic evidence of a duct stone. Liver: Cyst in the left liver lobe measuring 13 somewhat mottled appearance of the liver in its more posterior aspect, which could be technique. The possibility of numerous small infiltrating lesion should be considered in the patient's clinical setting. Liver is normal in size and overall echogenicity. IMPRESSION: 1. Chronically dilated common bile duct. Status post cholecystectomy. 2. Mottled appearance of the posterior liver. Consider followup liver MRI with and without contrast. Electronically Signed   By: Lajean Manes M.D.   On: 09/15/2015 10:04     LOS: 5 days   Oren Binet, MD  [facility] Pager:[phone]  If 7PM-7AM, please contact night-coverage [email or web address] Password [email or web address] 09/19/2015, 11:48 AM

## 2015-09-20 ENCOUNTER — Inpatient Hospital Stay (HOSPITAL_COMMUNITY): Payer: Medicare PPO

## 2015-09-20 ENCOUNTER — Ambulatory Visit
Admit: 2015-09-20 | Discharge: 2015-09-20 | Disposition: A | Discharge status: Home / Self Care | Payer: Medicare PPO | Attending: Radiation Oncology | Admitting: Radiation Oncology

## 2015-09-20 DIAGNOSIS — C7951 Secondary malignant neoplasm of bone: Principal | ICD-10-CM

## 2015-09-20 DIAGNOSIS — R0789 Other chest pain: Secondary | ICD-10-CM | POA: Insufficient documentation

## 2015-09-20 DIAGNOSIS — C50919 Malignant neoplasm of unspecified site of unspecified female breast: Secondary | ICD-10-CM

## 2015-09-20 DIAGNOSIS — R0781 Pleurodynia: Secondary | ICD-10-CM

## 2015-09-20 LAB — SEDIMENTATION RATE: Sed Rate: 133 mm/hr — ABNORMAL HIGH (ref 0–22)

## 2015-09-20 LAB — C-REACTIVE PROTEIN: CRP: 3.9 mg/dL — ABNORMAL HIGH (ref <1.0)

## 2015-09-20 LAB — PROTIME-INR
INR: 2.11 — AB (ref 0.00–1.49)
PROTHROMBIN TIME: 23.5 s — AB (ref 11.6–15.2)

## 2015-09-20 MED ORDER — WARFARIN SODIUM 2 MG PO TABS
2.0000 mg | ORAL_TABLET | Freq: Once | ORAL | Status: AC
  Administered 2015-09-20: 2 mg via ORAL
  Filled 2015-09-20: qty 1

## 2015-09-20 NOTE — Progress Notes (Addendum)
    [facility] has been requested to perform a transesophageal echocardiogram on 05/24 for bacteremia.  After careful review of history and examination, the risks and benefits of transesophageal echocardiogram have been explained including risks of esophageal damage, perforation (1:10,000 risk), bleeding, pharyngeal hematoma as well as other potential complications associated with conscious sedation including aspiration, arrhythmia, respiratory failure and death. Alternatives to treatment were discussed, questions were answered. Patient is willing to proceed. Endo contacted, Dr Oval Linsey will perform, Orders written.  Lenoard Aden 09/20/2015 8:35 AM

## 2015-09-20 NOTE — Progress Notes (Signed)
Daily Progress Note   Patient Name: [name]       Date: 09/20/2015 DOB: 1945-05-22  Age: 70 y.o. MRN#: [id number] Attending Physician: Jonetta Osgood, MD Primary Care Physician: Curlene Labrum, MD Admit Date: 09/14/2015  Reason for Consultation/Follow-up: Pain control Life limiting illness: breast cancer, bone mets, admitted with SIRS and PNA Subjective:  Sitting in bed. In no distress.  She reports being frustrated that she is not going to be going for TEE testing today. She also feels that she will ask for pain medication and it takes longer than she would like to get dose when she asks for it.  Length of Stay: 6  Current Medications: Scheduled Meds:  . dexamethasone  2 mg Oral Q12H  . docusate sodium  100 mg Oral Daily  . feeding supplement  1 Container Oral BID BM  . fentaNYL  50 mcg Transdermal Q72H  . lidocaine  1 patch Transdermal Q24H  . pantoprazole  40 mg Oral Daily  . polyethylene glycol  17 g Oral Daily  . vancomycin  500 mg Intravenous Q12H  . Warfarin - Pharmacist Dosing Inpatient   Does not apply q1800    Continuous Infusions:    PRN Meds: gi cocktail, HYDROmorphone (DILAUDID) injection, magic mouthwash w/lidocaine, morphine, ondansetron (ZOFRAN) IV, polyvinyl alcohol, senna-docusate  Physical Exam         NAD S1 S2 Clear No edema Awake alert  Vital Signs: BP 111/71 mmHg  Pulse 88  Temp(Src) 98.9 F (37.2 C) (Oral)  Resp 18  Ht 5' 2"  (1.575 m)  Wt 59.648 kg (131 lb 8 oz)  BMI 24.05 kg/m2  SpO2 95% SpO2: SpO2: 95 % O2 Device: O2 Device: Nasal Cannula O2 Flow Rate: O2 Flow Rate (L/min): 3 L/min  Intake/output summary:   Intake/Output Summary (Last 24 hours) at 09/20/15 2217 Last data filed at 09/20/15 1504  Gross per 24 hour  Intake     220 ml  Output   1550 ml  Net  -1330 ml   LBM: Last BM Date: 09/20/15 Baseline Weight: Weight: 59.648 kg (131 lb 8 oz) Most recent weight: Weight: 59.648 kg (131 lb 8 oz)       Palliative Assessment/Data:    Flowsheet Rows        Most Recent Value   Intake  Tab    Referral Department  Hospitalist   Unit at Time of Referral  Oncology Unit   Palliative Care Primary Diagnosis  Cancer   Date Notified  09/16/15   Palliative Care Type  Return patient Palliative Care   Reason for referral  Pain   Date of Admission  09/14/15   Date first seen by Palliative Care  09/17/15   # of days Palliative referral response time  1 Day(s)   # of days IP prior to Palliative referral  2   Clinical Assessment    Palliative Performance Scale Score  50%   Pain Max last 24 hours  6   Pain Min Last 24 hours  4   Dyspnea Max Last 24 Hours  4   Dyspnea Min Last 24 hours  3   Nausea Max Last 24 Hours  3   Nausea Min Last 24 Hours  2   Psychosocial & Spiritual Assessment    Palliative Care Outcomes    Patient/Family meeting held?  Yes   Who was at the meeting?  patient herself   Palliative Care follow-up planned  Yes, Facility      Patient Active Problem List   Diagnosis Date Noted  . Rib pain on left side   . MRSA bacteremia   . Staphylococcus aureus bacteremia with sepsis ([place])   . Neoplasm related pain   . Encounter for palliative care   . Generalized abdominal pain   . Elevated LFTs   . SIRS (systemic inflammatory response syndrome) ([place]) 09/14/2015  . Pain from bone metastases ([place]) 06/28/2015  . Cancer of overlapping sites of right breast ([place]) 05/11/2015  . Bilateral breast cancer ([place]) 05/11/2015  . C. difficile colitis 02/08/2015  . Protein-calorie malnutrition, severe ([place]) 01/25/2015  . Clostridium difficile diarrhea   . Hypokalemia 01/24/2015  . AKI (acute kidney injury) ([place]) 01/24/2015  . Essential hypertension 01/24/2015  . Abdominal pain   . Dehydration 09/09/2014  .  HCAP (healthcare-associated pneumonia) 08/09/2014  . Cavitating mass in left upper lung lobe 08/09/2014  . Anorexia 03/02/2013  . Weight loss due to medication 03/02/2013  . Swollen R ankle 08/10/2012  . Fracture of ankle, medial malleolus, right, closed 08/10/2012  . Rotator cuff tear 09/24/2011  . Breast cancer metastasized to bone ([place]) 04/30/2011  . Rash 03/01/2011  . Seroma, post-traumatic 12/25/2010  . RECTAL BLEEDING 12/08/2009  . FLATULENCE 12/08/2009  . OTH NONSPC ABN FINDNG RAD&OTH EXM BODY STRUCTURE 01/10/2009  . GERD 12/26/2008  . Osteoarthritis 12/26/2008  . Nausea without vomiting 12/26/2008  . Diarrhea 12/26/2008  . Personal history of thrombophlebitis 12/26/2008  . COLONIC POLYPS, HX OF 12/26/2008    Palliative Care Assessment & Plan   Assessment:  breast ca met dz to bone Uncontrolled pain Admitted for SIRS PNA   Recommendations/Plan:  Remains on antibiotics as per TRH. Fentanyl patch increased to 39mg/hr yesterday. We talked today about the loss of independence that she has suffered, and I do think that she has a large component of pain from existential suffering and loss of her independence.  I discussed with her today using her rescue medication regularly as well as premedicating prior to activities that cause pain.  Continue current pain regimen.  I think that evaluation for palliative radiation would be beneficial as well.  Consider also trial of bisphosphonate.        Code Status:    Code Status Orders        Start  Ordered   09/14/15 1820  Full code   Continuous     09/14/15 1819    Code Status History    Date Active Date Inactive Code Status Order ID Comments User Context   01/24/2015  6:59 PM 01/28/2015  7:06 PM Full Code [id number]  Waldemar Dickens, MD Inpatient   08/09/2014  1:31 PM 08/11/2014  7:05 PM Full Code [id number]  Melton Alar, PA-C Inpatient    Advance Directive Documentation        Most Recent Value   Type of Advance  Directive  Healthcare Power of Attorney, Living will   Pre-existing out of facility DNR order (yellow form or pink MOST form)     "MOST" Form in Place?         Prognosis:   Unable to determine but appears guarded, see above.   Discharge Planning:  To Be Determined  Care plan was discussed with  patient  Thank you for allowing the Palliative Medicine Team to assist in the care of this patient.   Time In: 1000 Time Out: 1025 Total Time 25 Prolonged Time Billed  no       Greater than 50%  of this time was spent counseling and coordinating care related to the above assessment and plan.  Micheline Rough, MD [phone] Please contact Palliative Medicine Team phone at [phone] for questions and concerns.

## 2015-09-20 NOTE — Progress Notes (Addendum)
PROGRESS NOTE        PATIENT DETAILS Name: [name] Age: 70 y.o. Sex: female Date of Birth: 07/10/1945 Admit Date: 09/14/2015 Admitting Physician Evalee Mutton Kristeen Mans, MD [id number] E, MD  Brief Narrative: Patient is a 70 y.o. female with history of metastatic breast cancer, admitted with healthcare associated pneumonia, generalized weakness and generalized bony pain.Blood cultures done at [facility] on 5/18 are positive for MRSA  Subjective: Continues to have diffuse bone pain today-remains afebrile  Assessment/Plan: Principal Problem: SIR's secondary to HCAP (healthcare-associated pneumonia) and MRSA bacteremia: Although clinically improved-now afebrile for >3 days.Main issues continue to be generalized weakness and diffuse pain. Blood cultures done here are negative, but blood cultures done at [facility] on 5/18 are positive for MRSA. Have asked lab personnel to fax report over. TEE scheduled from 5/25. Continue IV Vancomycin, suspect can IV Ancef.ID following.  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 Unfortunately, her deconditioning has only worsened due to acute illness and pneumonia. PT evaluation completed, recommendations are for SNF-patient is agreeable.  Generalized/Diffuse Pain: suspect secondary to multiple osseous mets, although still persistent she is seems more comfortable today with Decadron, Fentanyl transdermal and prn IV Dilaudid and prn MSIR. May need to be started on bisphosphonates if pain is still an issue in the next few days. Appreciate palliative care evaluation.  Mildly elevated LFTs: Now normalized-likely due to acute infection, but given history of malignancy-liver metastases is a cause of concern. RUQ ultrasound no  obvious metastatic lesions. Recent PET scan on 08/31/15 did not show any major hepatic lesions as well. For now continue to follow.  Epigastric/lower chest pain: better-described it as "indigestion"-continue PPI, and prn GI cocktail. EKG-no acute/new changes. Suspect no further work up required at this point-except following clinical course  Hypokalemia:repleted   Anemia: Likely secondary to chronic disease-worsened by acute illness, no evidence of blood loss. Follow CBC  HTN: Continue to hold both Coreg and lisinopril-but pressure currently controlled without antihypertensives.  History of extensive superficial venous thrombosis: Maintained on coumadin at home-continue per pharmacy  History of metastatic breast cancer: Recent PET scan on 08/31/15 showed numerous osseous mets-followed by Dr [name]. Recent MRI entire spine on 4/28 showed multiple osseous spinal mets with no cord compression.Obviously with fever/PNA-plans for chemo and port placement will need to be postponed.Seen by Oncology on 5/22-plans are to start  cyclophosphamide, methotrexate, and fluorouracil on 10/02/2015  Anorexia: Likely secondary to  underlying malignancy worsened by pneumonia. Continue IV Abx, if no improvement, will start Marinol  Protein-calorie malnutrition, severe: Continue supplements, await nutrition evaluation.  DVT Prophylaxis: Full dose anticoagulation on Coumadin  Code Status: Full code   Family Communication: None at bedside  Disposition Plan: Remain inpatient-SNF on discharge suspect 1-2 days  Antimicrobial agents: IV Vancomycin 5/23>> IV Ancef 5/23>>5/23 IV Vancomycin 5/18>>5/21 IV Cefepime 5/18>>5/23   Procedures: Echo 5/19>>EF 50-55%  CONSULTS:  ID  Oncology Palliative  Time spent: 25 minutes-Greater than 50% of this time was spent in counseling, explanation of diagnosis, planning of further management, and coordination of care.  MEDICATIONS: Anti-infectives    Start      Dose/Rate Route Frequency Ordered Stop   09/20/15 0600  vancomycin (VANCOCIN) 500 mg in  sodium chloride 0.9 % 100 mL IVPB     500 mg 100 mL/hr over 60 Minutes Intravenous Every 12 hours 09/19/15 1212     09/19/15 1800  amoxicillin-clavulanate (AUGMENTIN) 875-125 MG per tablet 1 tablet  Status:  Discontinued     1 tablet Oral Every 12 hours 09/19/15 1106 09/19/15 1148   09/19/15 1400  ceFAZolin (ANCEF) IVPB 2g/100 mL premix  Status:  Discontinued     2 g 200 mL/hr over 30 Minutes Intravenous Every 8 hours 09/19/15 1211 09/19/15 1651   09/19/15 1300  vancomycin (VANCOCIN) IVPB 1000 mg/200 mL premix     1,000 mg 200 mL/hr over 60 Minutes Intravenous  Once 09/19/15 1211 09/19/15 1325   09/15/15 0800  vancomycin (VANCOCIN) 500 mg in sodium chloride 0.9 % 100 mL IVPB  Status:  Discontinued     500 mg 100 mL/hr over 60 Minutes Intravenous Every 12 hours 09/14/15 1838 09/17/15 1106   09/15/15 0600  ceFEPIme (MAXIPIME) 1 g in dextrose 5 % 50 mL IVPB  Status:  Discontinued     1 g 100 mL/hr over 30 Minutes Intravenous Every 12 hours 09/14/15 1819 09/19/15 1105   09/14/15 2000  vancomycin (VANCOCIN) 1,250 mg in sodium chloride 0.9 % 250 mL IVPB     1,250 mg 166.7 mL/hr over 90 Minutes Intravenous  Once 09/14/15 1836 09/15/15 0026   09/14/15 1845  ceFEPIme (MAXIPIME) 2 g in dextrose 5 % 50 mL IVPB     2 g 100 mL/hr over 30 Minutes Intravenous  Once 09/14/15 1831 09/14/15 1918      Scheduled Meds: . dexamethasone  2 mg Oral Q12H  . docusate sodium  100 mg Oral Daily  . feeding supplement  1 Container Oral BID BM  . fentaNYL  50 mcg Transdermal Q72H  . lidocaine  1 patch Transdermal Q24H  . pantoprazole  40 mg Oral Daily  . polyethylene glycol  17 g Oral Daily  . vancomycin  500 mg Intravenous Q12H  . warfarin  2 mg Oral ONCE-1800  . Warfarin - Pharmacist Dosing Inpatient   Does not apply q1800   Continuous Infusions:  PRN Meds:.gi cocktail, HYDROmorphone (DILAUDID) injection, magic  mouthwash w/lidocaine, morphine, ondansetron (ZOFRAN) IV, polyvinyl alcohol, senna-docusate   PHYSICAL EXAM: Vital signs: Filed Vitals:   09/19/15 0439 09/19/15 1339 09/19/15 2112 09/20/15 0504  BP: 141/99 121/68 116/73 127/67  Pulse: 69 77 77 76  Temp: 98.5 F (36.9 C) 98 F (36.7 C) 98.3 F (36.8 C) 98.2 F (36.8 C)  TempSrc: Oral Oral Oral Oral  Resp: 20 20 20 20   Height:      Weight:      SpO2: 94% 95% 93% 92%   Filed Weights   09/14/15 1730  Weight: 59.648 kg (131 lb 8 oz)   Body mass index is 24.05 kg/(m^2).   Gen Exam: Awake and alert with clear speech.Chronically sick looking. Neck: Supple, No JVD.   Chest: B/L Clear.   CVS: S1 S2 Regular, no murmurs.  Abdomen: soft, BS +, mildly tender in epigastric area- non distended.  Extremities: no edema, lower extremities warm to touch. Neurologic: Non Focal-but exam limited due to pain. Skin: No Rash or lesions   Wounds: N/A.    LABORATORY DATA: CBC:  Recent Labs Lab 09/15/15 0521 09/17/15 0530  WBC 5.9 7.4  HGB 8.7* 9.1*  HCT 26.7* 27.6*  MCV 102.7* 103.0*  PLT 112* 148*    Basic Metabolic Panel:  Recent Labs Lab 09/15/15  [id number] 09/16/15 0532 09/17/15 0530  NA 141 138 139  K 3.3* 3.1* 3.8  CL 109 103 100*  CO2 24 27 28   GLUCOSE 75 101* 149*  BUN 25* 18 26*  CREATININE 0.76 0.62 0.64  CALCIUM 7.8* 8.6* 9.0    GFR: Estimated Creatinine Clearance: 51 mL/min (by C-G formula based on Cr of 0.64).  Liver Function Tests:  Recent Labs Lab 09/15/15 0521 09/16/15 0532  AST 70* 38  ALT 69* 54  ALKPHOS 153* 142*  BILITOT 1.4* 0.8  PROT 5.3* 4.9*  ALBUMIN 2.1* 1.9*   No results for input(s): LIPASE, AMYLASE in the last 168 hours. No results for input(s): AMMONIA in the last 168 hours.  Coagulation Profile:  Recent Labs Lab 09/16/15 0532 09/17/15 0530 09/18/15 0508 09/19/15 0516 09/20/15 0539  INR 1.95* 1.66* 2.17* 2.93* 2.11*    Cardiac Enzymes: No results for input(s): CKTOTAL,  CKMB, CKMBINDEX, TROPONINI in the last 168 hours.  BNP (last 3 results) No results for input(s): PROBNP in the last 8760 hours.  HbA1C: No results for input(s): HGBA1C in the last 72 hours.  CBG: No results for input(s): GLUCAP in the last 168 hours.  Lipid Profile: No results for input(s): CHOL, HDL, LDLCALC, TRIG, CHOLHDL, LDLDIRECT in the last 72 hours.  Thyroid Function Tests: No results for input(s): TSH, T4TOTAL, FREET4, T3FREE, THYROIDAB in the last 72 hours.  Anemia Panel: No results for input(s): VITAMINB12, FOLATE, FERRITIN, TIBC, IRON, RETICCTPCT in the last 72 hours.  Urine analysis:    Component Value Date/Time   COLORURINE YELLOW 09/14/2015 1834   APPEARANCEUR CLOUDY* 09/14/2015 1834   LABSPEC 1.013 09/14/2015 1834   PHURINE 5.5 09/14/2015 1834   GLUCOSEU NEGATIVE 09/14/2015 1834   HGBUR LARGE* 09/14/2015 1834   BILIRUBINUR NEGATIVE 09/14/2015 1834   KETONESUR NEGATIVE 09/14/2015 1834   PROTEINUR 30* 09/14/2015 1834   UROBILINOGEN 0.2 01/23/2015 1830   NITRITE POSITIVE* 09/14/2015 1834   LEUKOCYTESUR NEGATIVE 09/14/2015 1834    Sepsis Labs: Lactic Acid, Venous No results found for: LATICACIDVEN  MICROBIOLOGY: Recent Results (from the past 240 hour(s))  Culture, blood (routine x 2)     Status: None   Collection Time: 09/14/15  6:35 PM  Result Value Ref Range Status   Specimen Description BLOOD RIGHT ARM  Final   Special Requests BOTTLES DRAWN AEROBIC AND ANAEROBIC [place]  Final   Culture   Final    NO GROWTH 5 DAYS Performed at [facility]    Report Status 09/19/2015 FINAL  Final  Culture, blood (routine x 2)     Status: None   Collection Time: 09/14/15  6:40 PM  Result Value Ref Range Status   Specimen Description BLOOD LEFT HAND  Final   Special Requests BOTTLES DRAWN AEROBIC AND ANAEROBIC 5CC  Final   Culture   Final    NO GROWTH 5 DAYS Performed at [facility]    Report Status 09/19/2015 FINAL  Final    RADIOLOGY  STUDIES/RESULTS: Mr Jeri Cos [id number] Contrast  08/25/2015  CLINICAL DATA:  Ongoing headache. Arm pain and pain beneath the sternum and ribs for 2 weeks. Right leg pain. Metastatic breast cancer. EXAM: MRI HEAD WITHOUT AND WITH CONTRAST; MRI CERVICAL SPINE WITHOUT AND WITH CONTRAST; MRI LUMBAR SPINE WITHOUT AND WITH CONTRAST; MRI THORACIC SPINE WITHOUT AND WITH CONTRAST TECHNIQUE: Multiplanar, multiecho pulse sequences of the brain and surrounding structures were obtained according to standard protocol without and with intravenous contrast; Multiplanar and multiecho pulse sequences  of the cervical spine, to include the craniocervical junction and cervicothoracic junction, were obtained according to standard protocol without and with intravenous contrast.; Multiplanar and multiecho pulse sequences of the lumbar spine were obtained without and with intravenous contrast.; Multiplanar and multiecho pulse sequences of the thoracic spine were obtained without and with intravenous contrast. CONTRAST:  76mL MULTIHANCE GADOBENATE DIMEGLUMINE 529 MG/ML IV SOLN COMPARISON:  Head CT 08/05/2015 and MRI 02/14/2009. Cervical spine MRI 05/14/2013. Thoracic and lumbar spine MRI 07/05/2015. FINDINGS: MR HEAD FINDINGS: There is no evidence of acute infarct, intracranial hemorrhage, mass, midline shift, or extra-axial fluid collection. There is mild cerebral atrophy. Small foci of T2 hyperintensity in the cerebral white matter bilaterally, stable to slightly increased from 2010 and nonspecific but compatible with mild-to-moderate chronic small vessel ischemic disease. No enhancing brain lesions are identified. There is at most minimally prominent dural enhancement without nodularity. Small enhancing lesions are present throughout the skull, progressive from 2010 (for example 3 cm left parietal lesion on image 28 of series 41). There is involvement of the clivus. Prior bilateral cataract extraction is noted. There is a trace left mastoid  effusion. No significant paranasal sinus inflammatory disease. Major intracranial vascular flow voids are preserved. MR CERVICAL SPINE FINDINGS: Cervical spine straightening is unchanged. There is no significant listhesis. There is progressive osseous metastatic disease throughout the cervical spine since the 2015 MRI. There is now complete marrow replacement involving the C4 vertebral body with patchy enhancement including a 1 cm focus posteriorly. There is also a left-sided posterior element involvement at C4 which is new. No epidural tumor or spinal cord compression is identified. No cervical spinal cord signal abnormality is identified within limitations of motion artifact. Diffuse cervical disc degeneration is again seen. There is mild neural foraminal stenosis on the right at C3-4, on the left at C4-5, and likely bilaterally at C5-6 and C6-7. This is overall similar to the prior MRI. No significant spinal stenosis is seen. The paraspinal soft tissues are unremarkable. MR THORACIC SPINE FINDINGS: Diffuse osseous metastatic disease is again seen throughout the vertebral bodies and posterior elements of the thoracic spine. There has overall been mild progression from the 07/05/2015 thoracic spine MRI. There is now near complete involvement of the T10 vertebral body. There is mildly progressive marrow replacement involving multiple mid thoracic vertebral bodies as well as T1 and T2. The T3 vertebral body remains completely involved. There is a mild T12 superior endplate compression fracture which demonstrates slightly progressive height loss compared to the prior study. There is slightly prominent ventral epidural enhancement in the mid thoracic spine from T5 -T8, most conspicuous and mildly nodular in a left paracentral location at the T8 superior endplate level (series 37, image 33). This may reflect very small volume epidural tumor, prominent epidural venous plexus, or a combination of both. This has likely not  significantly changed from the prior thoracic spine MRI, although there is much more motion artifact on axial sequences on that study. The spinal cord is normal in caliber and signal. There is no spinal cord compression. Multilevel disc degeneration is again seen with disc bulging and small disc protrusions without significant spinal canal or neural foraminal stenosis. Small left adrenal nodule and mild left upper pole renal caliectasis or cysts are partially visualized, grossly similar to the 09/07/2014 PET-CT. MR LUMBAR SPINE FINDINGS: Vertebral alignment is unchanged, with trace anterolisthesis again seen of L3 on L4. Osseous lesions throughout the lumbar spine do not appear significantly changed from the prior lumbar  spine MRI, with the largest lesion in the L1 vertebral body. S3 level sacral osseous metastatic disease is partially visualized and grossly similar to the prior study. 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 Lumbar vertebral body heights are preserved. There is diffuse lumbar disc desiccation. Severe disc space height loss at L4-5 is unchanged. Multilevel disc and facet degeneration does not appear significantly changed, with mild right foraminal stenosis again seen at L3-4. There is no spinal stenosis. No epidural tumor is identified. Conus medullaris is normal in signal and terminates at L1. IMPRESSION: 1. No acute intracranial abnormality. No evidence of brain metastases. 2. Numerous osseous skull metastases, progressed from 2010. 3. Widespread spinal osseous metastatic disease, with interval progression in the cervical and thoracic spine. No sizable epidural tumor identified. Slightly prominent ventral epidural enhancement in the mid thoracic spine is favored to mostly reflect epidural venous plexus, however trace epidural tumor is possible particularly at T8. 4. No spinal stenosis. 5. Mild T12 superior endplate compression fracture with slightly progressive height loss from  07/05/2015. Electronically Signed   By: Logan Bores M.D.   On: 08/25/2015 17:08   Mr Cervical Spine W Wo Contrast  08/25/2015  CLINICAL DATA:  Ongoing headache. Arm pain and pain beneath the sternum and ribs for 2 weeks. Right leg pain. Metastatic breast cancer. EXAM: MRI HEAD WITHOUT AND WITH CONTRAST; MRI CERVICAL SPINE WITHOUT AND WITH CONTRAST; MRI LUMBAR SPINE WITHOUT AND WITH CONTRAST; MRI THORACIC SPINE WITHOUT AND WITH CONTRAST TECHNIQUE: Multiplanar, multiecho pulse sequences of the brain and surrounding structures were obtained according to standard protocol without and with intravenous contrast; Multiplanar and multiecho pulse sequences of the cervical spine, to include the craniocervical junction and cervicothoracic junction, were obtained according to standard protocol without and with intravenous contrast.; Multiplanar and multiecho pulse sequences of the lumbar spine were obtained without and with intravenous contrast.; Multiplanar and multiecho pulse sequences of the thoracic spine were obtained without and with intravenous contrast. CONTRAST:  71mL MULTIHANCE GADOBENATE DIMEGLUMINE 529 MG/ML IV SOLN COMPARISON:  Head CT 08/05/2015 and MRI 02/14/2009. Cervical spine MRI 05/14/2013. Thoracic and lumbar spine MRI 07/05/2015. FINDINGS: MR HEAD FINDINGS: There is no evidence of acute infarct, intracranial hemorrhage, mass, midline shift, or extra-axial fluid collection. There is mild cerebral atrophy. Small foci of T2 hyperintensity in the cerebral white matter bilaterally, stable to slightly increased from 2010 and nonspecific but compatible with mild-to-moderate chronic small vessel ischemic disease. No enhancing brain lesions are identified. There is at most minimally prominent dural enhancement without nodularity. Small enhancing lesions are present throughout the skull, progressive from 2010 (for example 3 cm left parietal lesion on image 28 of series 41). There is involvement of the clivus.  Prior bilateral cataract extraction is noted. There is a trace left mastoid effusion. No significant paranasal sinus inflammatory disease. Major intracranial vascular flow voids are preserved. MR CERVICAL SPINE FINDINGS: Cervical spine straightening is unchanged. There is no significant listhesis. There is progressive osseous metastatic disease throughout the cervical spine since the 2015 MRI. There is now complete marrow replacement involving the C4 vertebral body with patchy enhancement including a 1 cm focus posteriorly. There is also a left-sided posterior element involvement at C4 which is new. No epidural tumor or spinal cord compression is identified. No cervical spinal cord signal abnormality is identified within limitations of motion artifact. Diffuse cervical disc degeneration is again seen. There is mild neural foraminal stenosis on the right at C3-4, on the left at C4-5, and likely bilaterally  at C5-6 and C6-7. This is overall similar to the prior MRI. No significant spinal stenosis is seen. The paraspinal soft tissues are unremarkable. MR THORACIC SPINE FINDINGS: Diffuse osseous metastatic disease is again seen throughout the vertebral bodies and posterior elements of the thoracic spine. There has overall been mild progression from the 07/05/2015 thoracic spine MRI. There is now near complete involvement of the T10 vertebral body. There is mildly progressive marrow replacement involving multiple mid thoracic vertebral bodies as well as T1 and T2. The T3 vertebral body remains completely involved. There is a mild T12 superior endplate compression fracture which demonstrates slightly progressive height loss compared to the prior study. There is slightly prominent ventral epidural enhancement in the mid thoracic spine from T5 -T8, most conspicuous and mildly nodular in a left paracentral location at the T8 superior endplate level (series 37, image 33). This may reflect very small volume epidural tumor,  prominent epidural venous plexus, or a combination of both. This has likely not significantly changed from the prior thoracic spine MRI, although there is much more motion artifact on axial sequences on that study. The spinal cord is normal in caliber and signal. There is no spinal cord compression. Multilevel disc degeneration is again seen with disc bulging and small disc protrusions without significant spinal canal or neural foraminal stenosis. Small left adrenal nodule and mild left upper pole renal caliectasis or cysts are partially visualized, grossly similar to the 09/07/2014 PET-CT. MR LUMBAR SPINE FINDINGS: Vertebral alignment is unchanged, with trace anterolisthesis again seen of L3 on L4. Osseous lesions throughout the lumbar spine do not appear significantly changed from the prior lumbar spine MRI, with the largest lesion in the L1 vertebral body. S3 level sacral osseous metastatic disease is partially visualized and grossly similar to the prior study. 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 Lumbar vertebral body heights are preserved. There is diffuse lumbar disc desiccation. Severe disc space height loss at L4-5 is unchanged. Multilevel disc and facet degeneration does not appear significantly changed, with mild right foraminal stenosis again seen at L3-4. There is no spinal stenosis. No epidural tumor is identified. Conus medullaris is normal in signal and terminates at L1. IMPRESSION: 1. No acute intracranial abnormality. No evidence of brain metastases. 2. Numerous osseous skull metastases, progressed from 2010. 3. Widespread spinal osseous metastatic disease, with interval progression in the cervical and thoracic spine. No sizable epidural tumor identified. Slightly prominent ventral epidural enhancement in the mid thoracic spine is favored to mostly reflect epidural venous plexus, however trace epidural tumor is possible particularly at T8. 4. No spinal stenosis. 5. Mild T12 superior  endplate compression fracture with slightly progressive height loss from 07/05/2015. Electronically Signed   By: Logan Bores M.D.   On: 08/25/2015 17:08   Mr Thoracic Spine W Wo Contrast  08/25/2015  CLINICAL DATA:  Ongoing headache. Arm pain and pain beneath the sternum and ribs for 2 weeks. Right leg pain. Metastatic breast cancer. EXAM: MRI HEAD WITHOUT AND WITH CONTRAST; MRI CERVICAL SPINE WITHOUT AND WITH CONTRAST; MRI LUMBAR SPINE WITHOUT AND WITH CONTRAST; MRI THORACIC SPINE WITHOUT AND WITH CONTRAST TECHNIQUE: Multiplanar, multiecho pulse sequences of the brain and surrounding structures were obtained according to standard protocol without and with intravenous contrast; Multiplanar and multiecho pulse sequences of the cervical spine, to include the craniocervical junction and cervicothoracic junction, were obtained according to standard protocol without and with intravenous contrast.; Multiplanar and multiecho pulse sequences of the lumbar spine were obtained without and  with intravenous contrast.; Multiplanar and multiecho pulse sequences of the thoracic spine were obtained without and with intravenous contrast. CONTRAST:  53mL MULTIHANCE GADOBENATE DIMEGLUMINE 529 MG/ML IV SOLN COMPARISON:  Head CT 08/05/2015 and MRI 02/14/2009. Cervical spine MRI 05/14/2013. Thoracic and lumbar spine MRI 07/05/2015. FINDINGS: MR HEAD FINDINGS: There is no evidence of acute infarct, intracranial hemorrhage, mass, midline shift, or extra-axial fluid collection. There is mild cerebral atrophy. Small foci of T2 hyperintensity in the cerebral white matter bilaterally, stable to slightly increased from 2010 and nonspecific but compatible with mild-to-moderate chronic small vessel ischemic disease. No enhancing brain lesions are identified. There is at most minimally prominent dural enhancement without nodularity. Small enhancing lesions are present throughout the skull, progressive from 2010 (for example 3 cm left parietal  lesion on image 28 of series 41). There is involvement of the clivus. Prior bilateral cataract extraction is noted. There is a trace left mastoid effusion. No significant paranasal sinus inflammatory disease. Major intracranial vascular flow voids are preserved. MR CERVICAL SPINE FINDINGS: Cervical spine straightening is unchanged. There is no significant listhesis. There is progressive osseous metastatic disease throughout the cervical spine since the 2015 MRI. There is now complete marrow replacement involving the C4 vertebral body with patchy enhancement including a 1 cm focus posteriorly. There is also a left-sided posterior element involvement at C4 which is new. No epidural tumor or spinal cord compression is identified. No cervical spinal cord signal abnormality is identified within limitations of motion artifact. Diffuse cervical disc degeneration is again seen. There is mild neural foraminal stenosis on the right at C3-4, on the left at C4-5, and likely bilaterally at C5-6 and C6-7. This is overall similar to the prior MRI. No significant spinal stenosis is seen. The paraspinal soft tissues are unremarkable. MR THORACIC SPINE FINDINGS: Diffuse osseous metastatic disease is again seen throughout the vertebral bodies and posterior elements of the thoracic spine. There has overall been mild progression from the 07/05/2015 thoracic spine MRI. There is now near complete involvement of the T10 vertebral body. There is mildly progressive marrow replacement involving multiple mid thoracic vertebral bodies as well as T1 and T2. The T3 vertebral body remains completely involved. There is a mild T12 superior endplate compression fracture which demonstrates slightly progressive height loss compared to the prior study. There is slightly prominent ventral epidural enhancement in the mid thoracic spine from T5 -T8, most conspicuous and mildly nodular in a left paracentral location at the T8 superior endplate level (series  37, image 33). This may reflect very small volume epidural tumor, prominent epidural venous plexus, or a combination of both. This has likely not significantly changed from the prior thoracic spine MRI, although there is much more motion artifact on axial sequences on that study. The spinal cord is normal in caliber and signal. There is no spinal cord compression. Multilevel disc degeneration is again seen with disc bulging and small disc protrusions without significant spinal canal or neural foraminal stenosis. Small left adrenal nodule and mild left upper pole renal caliectasis or cysts are partially visualized, grossly similar to the 09/07/2014 PET-CT. MR LUMBAR SPINE FINDINGS: Vertebral alignment is unchanged, with trace anterolisthesis again seen of L3 on L4. Osseous lesions throughout the lumbar spine do not appear significantly changed from the prior lumbar spine MRI, with the largest lesion in the L1 vertebral body. S3 level sacral osseous metastatic disease is partially visualized and grossly similar to the prior study. 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 Lumbar vertebral  body heights are preserved. There is diffuse lumbar disc desiccation. Severe disc space height loss at L4-5 is unchanged. Multilevel disc and facet degeneration does not appear significantly changed, with mild right foraminal stenosis again seen at L3-4. There is no spinal stenosis. No epidural tumor is identified. Conus medullaris is normal in signal and terminates at L1. IMPRESSION: 1. No acute intracranial abnormality. No evidence of brain metastases. 2. Numerous osseous skull metastases, progressed from 2010. 3. Widespread spinal osseous metastatic disease, with interval progression in the cervical and thoracic spine. No sizable epidural tumor identified. Slightly prominent ventral epidural enhancement in the mid thoracic spine is favored to mostly reflect epidural venous plexus, however trace epidural tumor is  possible particularly at T8. 4. No spinal stenosis. 5. Mild T12 superior endplate compression fracture with slightly progressive height loss from 07/05/2015. Electronically Signed   By: Logan Bores M.D.   On: 08/25/2015 17:08   Mr Lumbar Spine W Wo Contrast  08/25/2015  CLINICAL DATA:  Ongoing headache. Arm pain and pain beneath the sternum and ribs for 2 weeks. Right leg pain. Metastatic breast cancer. EXAM: MRI HEAD WITHOUT AND WITH CONTRAST; MRI CERVICAL SPINE WITHOUT AND WITH CONTRAST; MRI LUMBAR SPINE WITHOUT AND WITH CONTRAST; MRI THORACIC SPINE WITHOUT AND WITH CONTRAST TECHNIQUE: Multiplanar, multiecho pulse sequences of the brain and surrounding structures were obtained according to standard protocol without and with intravenous contrast; Multiplanar and multiecho pulse sequences of the cervical spine, to include the craniocervical junction and cervicothoracic junction, were obtained according to standard protocol without and with intravenous contrast.; Multiplanar and multiecho pulse sequences of the lumbar spine were obtained without and with intravenous contrast.; Multiplanar and multiecho pulse sequences of the thoracic spine were obtained without and with intravenous contrast. CONTRAST:  55mL MULTIHANCE GADOBENATE DIMEGLUMINE 529 MG/ML IV SOLN COMPARISON:  Head CT 08/05/2015 and MRI 02/14/2009. Cervical spine MRI 05/14/2013. Thoracic and lumbar spine MRI 07/05/2015. FINDINGS: MR HEAD FINDINGS: There is no evidence of acute infarct, intracranial hemorrhage, mass, midline shift, or extra-axial fluid collection. There is mild cerebral atrophy. Small foci of T2 hyperintensity in the cerebral white matter bilaterally, stable to slightly increased from 2010 and nonspecific but compatible with mild-to-moderate chronic small vessel ischemic disease. No enhancing brain lesions are identified. There is at most minimally prominent dural enhancement without nodularity. Small enhancing lesions are present  throughout the skull, progressive from 2010 (for example 3 cm left parietal lesion on image 28 of series 41). There is involvement of the clivus. Prior bilateral cataract extraction is noted. There is a trace left mastoid effusion. No significant paranasal sinus inflammatory disease. Major intracranial vascular flow voids are preserved. MR CERVICAL SPINE FINDINGS: Cervical spine straightening is unchanged. There is no significant listhesis. There is progressive osseous metastatic disease throughout the cervical spine since the 2015 MRI. There is now complete marrow replacement involving the C4 vertebral body with patchy enhancement including a 1 cm focus posteriorly. There is also a left-sided posterior element involvement at C4 which is new. No epidural tumor or spinal cord compression is identified. No cervical spinal cord signal abnormality is identified within limitations of motion artifact. Diffuse cervical disc degeneration is again seen. There is mild neural foraminal stenosis on the right at C3-4, on the left at C4-5, and likely bilaterally at C5-6 and C6-7. This is overall similar to the prior MRI. No significant spinal stenosis is seen. The paraspinal soft tissues are unremarkable. MR THORACIC SPINE FINDINGS: Diffuse osseous metastatic disease is again seen throughout  the vertebral bodies and posterior elements of the thoracic spine. There has overall been mild progression from the 07/05/2015 thoracic spine MRI. There is now near complete involvement of the T10 vertebral body. There is mildly progressive marrow replacement involving multiple mid thoracic vertebral bodies as well as T1 and T2. The T3 vertebral body remains completely involved. There is a mild T12 superior endplate compression fracture which demonstrates slightly progressive height loss compared to the prior study. There is slightly prominent ventral epidural enhancement in the mid thoracic spine from T5 -T8, most conspicuous and mildly  nodular in a left paracentral location at the T8 superior endplate level (series 37, image 33). This may reflect very small volume epidural tumor, prominent epidural venous plexus, or a combination of both. This has likely not significantly changed from the prior thoracic spine MRI, although there is much more motion artifact on axial sequences on that study. The spinal cord is normal in caliber and signal. There is no spinal cord compression. Multilevel disc degeneration is again seen with disc bulging and small disc protrusions without significant spinal canal or neural foraminal stenosis. Small left adrenal nodule and mild left upper pole renal caliectasis or cysts are partially visualized, grossly similar to the 09/07/2014 PET-CT. MR LUMBAR SPINE FINDINGS: Vertebral alignment is unchanged, with trace anterolisthesis again seen of L3 on L4. Osseous lesions throughout the lumbar spine do not appear significantly changed from the prior lumbar spine MRI, with the largest lesion in the L1 vertebral body. S3 level sacral osseous metastatic disease is partially visualized and grossly similar to the prior study. 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 Lumbar vertebral body heights are preserved. There is diffuse lumbar disc desiccation. Severe disc space height loss at L4-5 is unchanged. Multilevel disc and facet degeneration does not appear significantly changed, with mild right foraminal stenosis again seen at L3-4. There is no spinal stenosis. No epidural tumor is identified. Conus medullaris is normal in signal and terminates at L1. IMPRESSION: 1. No acute intracranial abnormality. No evidence of brain metastases. 2. Numerous osseous skull metastases, progressed from 2010. 3. Widespread spinal osseous metastatic disease, with interval progression in the cervical and thoracic spine. No sizable epidural tumor identified. Slightly prominent ventral epidural enhancement in the mid thoracic spine is favored  to mostly reflect epidural venous plexus, however trace epidural tumor is possible particularly at T8. 4. No spinal stenosis. 5. Mild T12 superior endplate compression fracture with slightly progressive height loss from 07/05/2015. Electronically Signed   By: Logan Bores M.D.   On: 08/25/2015 17:08   Nm Pet Image Restag (ps) Skull Base To Thigh  08/31/2015  CLINICAL DATA:  Subsequent treatment strategy for metastatic breast carcinoma to bone. EXAM: NUCLEAR MEDICINE PET SKULL BASE TO THIGH TECHNIQUE: 6.3 mCi F-18 FDG was injected intravenously. Full-ring PET imaging was performed from the skull base to thigh after the radiotracer. CT data was obtained and used for attenuation correction and anatomic localization. FASTING BLOOD GLUCOSE:  Value: 103 mg/dl COMPARISON:  09/07/2014 FINDINGS: NECK No hypermetabolic lymph nodes in the neck. CHEST No hypermetabolic mediastinal or hilar nodes. No suspicious pulmonary nodules on the CT scan. Biapical pleural- parenchymal scarring appears stable. Previously seen area of hypermetabolic airspace disease in the left upper lobe has nearly completely resolved, consistent with resolving infectious or inflammatory process. ABDOMEN/PELVIS No abnormal hypermetabolic activity within the liver, pancreas, adrenal glands, or spleen. No hypermetabolic lymph nodes in the abdomen or pelvis. Tiny left hepatic lobe cyst remains stable. Prior  cholecystectomy noted. 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 11 mm left adrenal nodule remains stable and shows no metabolic activity, consistent with benign adrenal adenoma. Colonic diverticulosis noted, without evidence of diverticulitis. SKELETON Mixed lytic and sclerotic bone metastases again seen within spine, pelvis, sternum, and proximal left femur. These appear stable and show no significant change in low-grade metabolic activity, consistent with largely treated osseous metastatic disease. IMPRESSION: Near complete resolution of hypermetabolic  airspace disease in left upper lobe, consistent with resolving infectious or inflammatory process. No significant change in appearance of largely treated osseous metastatic disease. No new or progressive metastatic disease identified. Electronically Signed   By: Earle Gell M.D.   On: 08/31/2015 12:48   Dg Chest Port 1 View  09/15/2015  CLINICAL DATA:  Short breath, cough, fever EXAM: PORTABLE CHEST 1 VIEW COMPARISON:  08/31/2015 FINDINGS: Normal cardiac silhouette with ectatic aorta. Increased density in the LEFT upper lobe. The biapical densities are not changed from CT. Small LEFT effusion. No pneumothorax. IMPRESSION: 1. Increasing density in the LEFT upper lobe. Concerning for edema or infection. Electronically Signed   By: Suzy Bouchard M.D.   On: 09/15/2015 07:26   US Abdomen Limited Ruq  09/15/2015  CLINICAL DATA:  Elevated liver function tests. EXAM: US ABDOMEN LIMITED - RIGHT UPPER QUADRANT COMPARISON:  08/31/2015, PET-CT. FINDINGS: Gallbladder: Surgically absent Common bile duct: Diameter: Dilated to a maximum of 17 mm, unchanged from the recent prior PET-CT. No sonographic evidence of a duct stone. Liver: Cyst in the left liver lobe measuring 13 somewhat mottled appearance of the liver in its more posterior aspect, which could be technique. The possibility of numerous small infiltrating lesion should be considered in the patient's clinical setting. Liver is normal in size and overall echogenicity. IMPRESSION: 1. Chronically dilated common bile duct. Status post cholecystectomy. 2. Mottled appearance of the posterior liver. Consider followup liver MRI with and without contrast. Electronically Signed   By: Lajean Manes M.D.   On: 09/15/2015 10:04     LOS: 6 days   Oren Binet, MD  [facility] Pager:[phone]  If 7PM-7AM, please contact night-coverage [email or web address] Password [email or web address] 09/20/2015, 1:02 PM

## 2015-09-20 NOTE — Progress Notes (Signed)
Nutrition Follow-up  DOCUMENTATION CODES:   Severe malnutrition in context of chronic illness  INTERVENTION:  - Continue Boost Breeze BID - Continue ordered snacks - Continue to encourage PO intakes - RD will continue to monitor for needs  NUTRITION DIAGNOSIS:   Malnutrition related to chronic illness as evidenced by severe depletion of body fat, moderate depletions of muscle mass, severe depletion of muscle mass, energy intake < 75% for > or equal to 1 month. -ongoing  GOAL:   Patient will meet greater than or equal to 90% of their needs -likely met on average  MONITOR:   PO intake, Supplement acceptance, Weight trends, Labs, I & O's,   ASSESSMENT:   [name] is a 70 y.o. 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 Pt was eating breakfast at time of RD visit. She states that appetite remains decreased although is slightly better and that she has not had any nausea or abdominal pain while eating the past few days.   Unable to talk with pt about supplement order at this time. Pt seems depressed this AM and mainly focuses on TEE for today being rescheduled for 5/25 AM. Pt likely meeting needs on average but will continue to monitor for needs, adjustments; continue to encourage PO intakes. Medications reviewed. Labs reviewed; Cl: 100 mmol/L, BUN: 26 mg/dL.    5/19 - She endorses poor appetite since onset of cancer, treatment. "I never get hungry but I make myself eat."  - PO intake is very likely not meeting increased needs with eating items such as a grilled cheese for lunch and a sausage biscuit for breakfast. - Within  the past week, she states that she has eaten only a small amount of oatmeal.  - Still experiencing some nausea. - Denies chewing/swallowing problems -> was seen by SLP this morning and cleared for regular diet. - She endorses a normal wt of 192#a few months ago.  - She was on Megace and gained 10# as recently as 05/2015. M - Her current weight has fluctuated, but has been relatively stable for approximately 6 months. - Nutrition-Focused physical exam completed. Findings are severe fat depletion, moderate-severe muscle depletion, and no edema.    Diet Order:  Diet Heart Room service appropriate?: Yes; Fluid consistency:: Thin Diet NPO time specified  Skin:  Reviewed, no issues  Last BM:  5/23  Height:   Ht Readings from Last 1 Encounters:  09/14/15 _0  (1.575 m)    Weight:   Wt Readings from Last 1 Encounters:  09/14/15 131 lb 8 oz (59.648 kg)    Ideal Body Weight:  50 kg  BMI:  Body mass index is 24.05 kg/(m^2).  Estimated Nutritional Needs:   Kcal:  1800-2100 calories  Protein:  70-90 grams  Fluid:  >/= 1.8L  EDUCATION NEEDS:   No education needs identified at this time     Jarome Matin, RD, LDN Inpatient Clinical Dietitian Pager # [phone] After hours/weekend pager # [phone]

## 2015-09-20 NOTE — Progress Notes (Signed)
  Radiation Oncology         [phone] ________________________________  Name: [name] MRN: [id number]  Date: 09/20/2015  DOB: [birth year]  Follow-Up Visit Note  CC: Curlene Labrum, MD  Annia Belt, MD    ICD-9-CM ICD-10-CM   1. 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 Prior to starting her radiation therapy the patient was admitted to an outlying hospital and then transferred to [facility] . After discussion with the patient's attending physician she will likely be in the [place] area for some time and at nursing home for at least 2-3 more weeks. In light of this I met with the patient this evening and we discussed undergoing re-simulation for treatments to her painful areas and proceeding with treatments here in the [place] area. Patient's planning session is scheduled for tomorrow afternoon to set up all 3 areas and treatments will begin sometime next week. This is however contingent on her cardiac workup tomorrow morning at [place].                         ____________________________________ Gery Pray, MD

## 2015-09-20 NOTE — Progress Notes (Signed)
Subjective: No new complaints   Antibiotics:  Anti-infectives    Start     Dose/Rate Route Frequency Ordered Stop   09/20/15 0600  vancomycin (VANCOCIN) 500 mg in sodium chloride 0.9 % 100 mL IVPB     500 mg 100 mL/hr over 60 Minutes Intravenous Every 12 hours 09/19/15 1212     09/19/15 1800  amoxicillin-clavulanate (AUGMENTIN) 875-125 MG per tablet 1 tablet  Status:  Discontinued     1 tablet Oral Every 12 hours 09/19/15 1106 09/19/15 1148   09/19/15 1400  ceFAZolin (ANCEF) IVPB 2g/100 mL premix  Status:  Discontinued     2 g 200 mL/hr over 30 Minutes Intravenous Every 8 hours 09/19/15 1211 09/19/15 1651   09/19/15 1300  vancomycin (VANCOCIN) IVPB 1000 mg/200 mL premix     1,000 mg 200 mL/hr over 60 Minutes Intravenous  Once 09/19/15 1211 09/19/15 1325   09/15/15 0800  vancomycin (VANCOCIN) 500 mg in sodium chloride 0.9 % 100 mL IVPB  Status:  Discontinued     500 mg 100 mL/hr over 60 Minutes Intravenous Every 12 hours 09/14/15 1838 09/17/15 1106   09/15/15 0600  ceFEPIme (MAXIPIME) 1 g in dextrose 5 % 50 mL IVPB  Status:  Discontinued     1 g 100 mL/hr over 30 Minutes Intravenous Every 12 hours 09/14/15 1819 09/19/15 1105   09/14/15 2000  vancomycin (VANCOCIN) 1,250 mg in sodium chloride 0.9 % 250 mL IVPB     1,250 mg 166.7 mL/hr over 90 Minutes Intravenous  Once 09/14/15 1836 09/15/15 0026   09/14/15 1845  ceFEPIme (MAXIPIME) 2 g in dextrose 5 % 50 mL IVPB     2 g 100 mL/hr over 30 Minutes Intravenous  Once 09/14/15 1831 09/14/15 1918      Medications: Scheduled Meds: . dexamethasone  2 mg Oral Q12H  . docusate sodium  100 mg Oral Daily  . feeding supplement  1 Container Oral BID BM  . fentaNYL  50 mcg Transdermal Q72H  . lidocaine  1 patch Transdermal Q24H  . pantoprazole  40 mg Oral Daily  . polyethylene glycol  17 g Oral Daily  . vancomycin  500 mg Intravenous Q12H  . warfarin  2 mg Oral ONCE-1800  . Warfarin - Pharmacist Dosing Inpatient   Does not  apply q1800   Continuous Infusions:  PRN Meds:.gi cocktail, HYDROmorphone (DILAUDID) injection, magic mouthwash w/lidocaine, morphine, ondansetron (ZOFRAN) IV, polyvinyl alcohol, senna-docusate    Objective: Weight change:   Intake/Output Summary (Last 24 hours) at 09/20/15 1656 Last data filed at 09/20/15 1504  Gross per 24 hour  Intake    220 ml  Output   2050 ml  Net  -1830 ml   Blood pressure 94/58, pulse 96, temperature 98.5 F (36.9 C), temperature source Oral, resp. rate 20, height 5\' 2"  (1.575 m), weight 131 lb 8 oz (59.648 kg), SpO2 100 %. Temp:  [98.2 F (36.8 C)-98.5 F (36.9 C)] 98.5 F (36.9 C) (05/24 1513) Pulse Rate:  [76-96] 96 (05/24 1513) Resp:  [20] 20 (05/24 1513) BP: (94-127)/(58-73) 94/58 mmHg (05/24 1513) SpO2:  [92 %-100 %] 100 % (05/24 1513)  Physical Exam: General: Alert and awake, oriented x3, not in any acute distress. But dysphoric, irritable HEENT: anicteric sclera, EOMI, oropharynx clear and without exudate Cardiovascular: regular rate, normal r, no murmur rubs or gallops Pulmonary: clear to auscultation bilaterally, no wheezing, rales or rhonchi Gastrointestinal: soft nontender, nondistended, normal bowel sounds, Musculoskeletal:  Pain with palpatio of her left chest wall as well as her right calf Skin, soft tissue: no rashes Neuro: nonfocal  CBC:  CBC Latest Ref Rng 09/17/2015 09/15/2015 09/06/2015  WBC 4.0 - 10.5 K/uL 7.4 5.9 7.1  Hemoglobin 12.0 - 15.0 g/dL 9.1(L) 8.7(L) 10.6(L)  Hematocrit 36.0 - 46.0 % 27.6(L) 26.7(L) 32.2(L)  Platelets 150 - 400 K/uL 148(L) 112(L) 245       BMET No results for input(s): NA, K, CL, CO2, GLUCOSE, BUN, CREATININE, CALCIUM in the last 72 hours.   Liver Panel  No results for input(s): PROT, ALBUMIN, AST, ALT, ALKPHOS, BILITOT, BILIDIR, IBILI in the last 72 hours.     Sedimentation Rate  Recent Labs  09/20/15 0539  ESRSEDRATE 133*   C-Reactive Protein  Recent Labs  09/20/15 0539    CRP 3.9*    Micro Results: Recent Results (from the past 720 hour(s))  Culture, blood (routine x 2)     Status: None   Collection Time: 09/14/15  6:35 PM  Result Value Ref Range Status   Specimen Description BLOOD RIGHT ARM  Final   Special Requests BOTTLES DRAWN AEROBIC AND ANAEROBIC [place]  Final   Culture   Final    NO GROWTH 5 DAYS Performed at [facility]    Report Status 09/19/2015 FINAL  Final  Culture, blood (routine x 2)     Status: None   Collection Time: 09/14/15  6:40 PM  Result Value Ref Range Status   Specimen Description BLOOD LEFT HAND  Final   Special Requests BOTTLES DRAWN AEROBIC AND ANAEROBIC 5CC  Final   Culture   Final    NO GROWTH 5 DAYS Performed at [facility]    Report Status 09/19/2015 FINAL  Final    Studies/Results: No results found.    Assessment/Plan:  INTERVAL HISTORY:  Patient set for TEE in the am   Principal Problem:   HCAP (healthcare-associated pneumonia) Active Problems:   Breast cancer metastasized to bone ([place])   Anorexia   Essential hypertension   Protein-calorie malnutrition, severe ([place])   SIRS (systemic inflammatory response syndrome) ([place])   Encounter for palliative care   Generalized abdominal pain   Elevated LFTs   MRSA bacteremia   Staphylococcus aureus bacteremia with sepsis ([place])   Neoplasm related pain    [name] is a 70 y.o. 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  Specific aspects of clinical management are critical to optimizing the outcome of patients with SAB.  Therefore, the [facility] Antimicrobial Management Team [facility]) has initiated an intervention aimed at improving the management of SAB at [facility].   To do so, Infectious Diseases physicians are providing an evidence-based consult for the management of all patients with SAB.     Yes No Comments  Perform follow-up blood cultures (even if the patient is afebrile) to ensure clearance of bacteremia [x]  []  Metastatic breast cancer with mets to spine, skull other bones now   Remove vascular catheter and obtain follow-up blood cultures after the removal of the catheter []  []  Would not place a new PICC or long-term IV until we are sure that repeat blood cultures after 1-2 days of continuous vancomycin are negative   Perform echocardiography  to evaluate for endocarditis (transthoracic ECHO is 40-50% sensitive, TEE is > 90% sensitive) []  []  TEE tomorrow  Consult electrophysiologist to evaluate implanted cardiac device (pacemaker, ICD) []  []  NA  Ensure source control []  []  Have all abscesses been drained effectively? Have deep seeded infections (septic joints or osteomyelitis) had appropriate surgical debridement?  Investigate for "metastatic" sites of infection []  []  Does the patient have ANY symptom or physical exam finding that would suggest a deeper infection (back or neck pain that may be suggestive of vertebral osteomyelitis or epidural abscess, muscle pain that could be a symptom of pyomyositis)?  Keep in mind that for deep seeded infections MRI imaging with contrast is preferred rather than other often insensitive tests such as plain x-rays, especially early in a patient's presentation.  I am ordering CT of the chest without contrast   Change antibiotic therapy to vancomycin []  []  Beta-lactam antibiotics are preferred for MSSA due to higher cure rates.   If on Vancomycin, goal trough should be 15 - 20 mcg/mL  Estimated duration of IV antibiotic therapy:  4-6 weeks [x]  []  Consult case management for probably prolonged outpatient IV antibiotic therapy       LOS: 6 days   Alcide Evener 09/20/2015, 4:56 PM

## 2015-09-20 NOTE — Progress Notes (Signed)
ANTICOAGULATION CONSULT NOTE - Follow-Up  Pharmacy Consult for Warfarin Indication: hx extensive superficial vein thrombosis  Allergies  Allergen Reactions  . Aspirin Nausea And Vomiting  . Boost Pudding [Nutritional Supplements] Diarrhea  . Percodan [Oxycodone-Aspirin] Nausea Only  . Codeine Nausea Only   Patient Measurements: Height: 5\' 2"  (157.5 cm) Weight: 131 lb 8 oz (59.648 kg) IBW/kg (Calculated) : 50.1  Vital Signs: Temp: 98.2 F (36.8 C) (05/24 0504) Temp Source: Oral (05/24 0504) BP: 127/67 mmHg (05/24 0504) Pulse Rate: 76 (05/24 0504)  Labs:  Recent Labs  09/18/15 0508 09/19/15 0516 09/20/15 0539  LABPROT 23.3* 29.2* 23.5*  INR 2.17* 2.93* 2.11*    Estimated Creatinine Clearance: 51 mL/min (by C-G formula based on Cr of 0.64).  Medications:  Scheduled:  . dexamethasone  2 mg Oral Q12H  . docusate sodium  100 mg Oral Daily  . feeding supplement  1 Container Oral BID BM  . fentaNYL  50 mcg Transdermal Q72H  . lidocaine  1 patch Transdermal Q24H  . pantoprazole  40 mg Oral Daily  . polyethylene glycol  17 g Oral Daily  . vancomycin  500 mg Intravenous Q12H  . Warfarin - Pharmacist Dosing Inpatient   Does not apply q1800   Assessment: 26 yoF with hx of mets breast cancer on letrozole+ palbocicib, denosumab with plan for PAC placement on 5/18 for chemo to start in June. She was also on warfarin PTA for hx extensive vein thrombosis. Outpatient records patient was supposed to hold her warfarin three days prior to [facility] procedure.  However,  her INR was supratherapeutic at 9 on 5/10 and patient was instructed to hold her warfarin at that time.  Patient has not gotten PAC yet. She presented to the ED on 5/18 with c/o fever, chills, cough and worsening weakness. INR was 1.90 on admission  Home warfarin regimen: 2 mg daily except 4 mg on FSS   Today, 09/20/2015: - INR this AM is therapeutic @ 2.11, down from 2.89 on 5/22. - Most recent dose is 2 mg on 5/22, dose  was held 5/23.  - CBC: Hgb stable, pltc improved (112->148) on 5/21  - No bleeding documented - Drug-drug intxns: abx - Cardiac diet  Goal of Therapy:  INR 2-3 Monitor platelets by anticoagulation protocol: Yes   Plan:  - Give warfarin 2 mg x 1 today to match home dose and maintain pt in therapeutic range. - Daily INR/PTT - Monitor for s/s bleeding  Myrna Blazer, PharmD Candidate  09/20/2015 7:51 AM

## 2015-09-20 NOTE — Progress Notes (Signed)
[facility] contacted and will pick up pt between 12-928 to transport to [facility] endo for TEE

## 2015-09-20 NOTE — Progress Notes (Signed)
Physical Therapy Treatment Patient Details Name: [name] MRN: [id number] DOB: Sep 14, 1945 Today's Date: 2015-09-29    History of Present Illness 70 yo female admitted with Pna, SIRS. Hx of breast cancer with bony mets, FTT, HTN    PT Comments    Pt just returned to bed from using bathroom however encouraged to mobilize.  Pt agreeable for OOB to recliner.  Pt reports increased dyspnea with activity however SpO2 96% on 2L O2 upon sitting in recliner.  Pt reports procedure scheduled tomorrow at [facility] she is uncertain of her d/c plan at this time.   Follow Up Recommendations  SNF     Equipment Recommendations  Rolling walker with 5" wheels    Recommendations for Other Services       Precautions / Restrictions Precautions Precautions: Fall Precaution Comments: blisters on buttocks    Mobility  Bed Mobility Overal bed mobility: Needs Assistance Bed Mobility: Sidelying to Sit   Sidelying to sit: Min assist       General bed mobility comments: assist for trunk upright  Transfers Overall transfer level: Needs assistance Equipment used: Rolling walker (2 wheeled) Transfers: Sit to/from Stand Sit to Stand: Min assist         General transfer comment: Assist to rise, stabilize, control descent.   Ambulation/Gait Ambulation/Gait assistance: Min guard Ambulation Distance (Feet): 10 Feet Assistive device: Rolling walker (2 wheeled) Gait Pattern/deviations: Step-through pattern;Decreased stride length;Trunk flexed     General Gait Details: only ambulated around bed to chair per pt request (feeling fatigued, just up to bathroom, and SOB) slow gait speed, reports 3/4 dyspnea with activity, SpO2 96% on 2L O2 [place]   Stairs            Wheelchair Mobility    Modified Rankin (Stroke Patients Only)       Balance                                    Cognition Arousal/Alertness: Awake/alert Behavior During Therapy: Flat affect Overall  Cognitive Status: Within Functional Limits for tasks assessed                      Exercises      General Comments        Pertinent Vitals/Pain Pain Assessment: Faces Faces Pain Scale: Hurts even more Pain Location: "all over" Pain Descriptors / Indicators: Sore;Aching;Grimacing Pain Intervention(s): Monitored during session;Repositioned;Limited activity within patient's tolerance    Home Living                      Prior Function            PT Goals (current goals can now be found in the care plan section) Progress towards PT goals: Progressing toward goals    Frequency  Min 3X/week    PT Plan Current plan remains appropriate    Co-evaluation             End of Session Equipment Utilized During Treatment: Oxygen Activity Tolerance: Patient limited by fatigue Patient left: in chair;with call bell/phone within reach;with chair alarm set     Time: [id number] PT Time Calculation (min) (ACUTE ONLY): 11 min  Charges:  $Gait Training: 8-22 mins                    G Codes:      Tyronica [place],KATHrine E 09-29-2015, 2:22  PM Carmelia Bake, PT, DPT 09/20/2015 Pager: [phone]

## 2015-09-21 ENCOUNTER — Inpatient Hospital Stay (HOSPITAL_COMMUNITY): Payer: Medicare PPO

## 2015-09-21 ENCOUNTER — Telehealth: Payer: Self-pay | Admitting: Pharmacist

## 2015-09-21 ENCOUNTER — Inpatient Hospital Stay (HOSPITAL_COMMUNITY): Payer: Medicare PPO | Admitting: Certified Registered Nurse Anesthetist

## 2015-09-21 ENCOUNTER — Encounter (HOSPITAL_COMMUNITY)
Admission: AD | Disposition: A | Discharge status: Skilled Nursing Facility | Payer: Self-pay | Source: Other Acute Inpatient Hospital | Attending: Internal Medicine

## 2015-09-21 ENCOUNTER — Ambulatory Visit
Admit: 2015-09-21 | Discharge: 2015-09-21 | Disposition: A | Discharge status: Home / Self Care | Payer: Medicare PPO | Attending: Radiation Oncology | Admitting: Radiation Oncology

## 2015-09-21 ENCOUNTER — Encounter (HOSPITAL_COMMUNITY): Payer: Self-pay | Admitting: Certified Registered Nurse Anesthetist

## 2015-09-21 DIAGNOSIS — R7881 Bacteremia: Secondary | ICD-10-CM | POA: Insufficient documentation

## 2015-09-21 DIAGNOSIS — C50919 Malignant neoplasm of unspecified site of unspecified female breast: Secondary | ICD-10-CM

## 2015-09-21 DIAGNOSIS — C7951 Secondary malignant neoplasm of bone: Principal | ICD-10-CM

## 2015-09-21 HISTORY — PX: TEE WITHOUT CARDIOVERSION: SHX5443

## 2015-09-21 LAB — BASIC METABOLIC PANEL
Anion gap: 7 (ref 5–15)
BUN: 23 mg/dL — AB (ref 6–20)
CHLORIDE: 101 mmol/L (ref 101–111)
CO2: 30 mmol/L (ref 22–32)
Calcium: 8.3 mg/dL — ABNORMAL LOW (ref 8.9–10.3)
Creatinine, Ser: 0.5 mg/dL (ref 0.44–1.00)
GFR calc Af Amer: >60 mL/min (ref >60)
GFR calc non Af Amer: >60 mL/min (ref >60)
GLUCOSE: 126 mg/dL — AB (ref 65–99)
POTASSIUM: 3.9 mmol/L (ref 3.5–5.1)
Sodium: 138 mmol/L (ref 135–145)

## 2015-09-21 LAB — VANCOMYCIN, TROUGH: Vancomycin Tr: 11 ug/mL (ref 10.0–20.0)

## 2015-09-21 LAB — PROTIME-INR
INR: 2.04 — AB (ref 0.00–1.49)
Prothrombin Time: 22.9 seconds — ABNORMAL HIGH (ref 11.6–15.2)

## 2015-09-21 SURGERY — ECHOCARDIOGRAM, TRANSESOPHAGEAL
Anesthesia: Monitor Anesthesia Care

## 2015-09-21 MED ORDER — SODIUM CHLORIDE 0.9 % IV SOLN
INTRAVENOUS | Status: DC
Start: 2015-09-21 — End: 2015-09-21

## 2015-09-21 MED ORDER — WARFARIN SODIUM 2 MG PO TABS
2.0000 mg | ORAL_TABLET | Freq: Once | ORAL | Status: AC
  Administered 2015-09-21: 2 mg via ORAL
  Filled 2015-09-21: qty 1

## 2015-09-21 MED ORDER — BUTAMBEN-TETRACAINE-BENZOCAINE 2-2-14 % EX AERO
INHALATION_SPRAY | CUTANEOUS | Status: DC | PRN
  Administered 2015-09-21: 2 via TOPICAL

## 2015-09-21 MED ORDER — LACTATED RINGERS IV SOLN
INTRAVENOUS | Status: DC | PRN
  Administered 2015-09-21: 10:00:00 via INTRAVENOUS

## 2015-09-21 MED ORDER — VANCOMYCIN HCL IN DEXTROSE 750-5 MG/150ML-% IV SOLN
750.0000 mg | Freq: Two times a day (BID) | INTRAVENOUS | Status: DC
  Administered 2015-09-22: 750 mg via INTRAVENOUS
  Filled 2015-09-21: qty 150

## 2015-09-21 MED ORDER — PROPOFOL 10 MG/ML IV BOLUS
INTRAVENOUS | Status: DC | PRN
  Administered 2015-09-21: 30 mg via INTRAVENOUS

## 2015-09-21 MED ORDER — PROPOFOL 500 MG/50ML IV EMUL
INTRAVENOUS | Status: DC | PRN
  Administered 2015-09-21: 40 ug/kg/min via INTRAVENOUS

## 2015-09-21 NOTE — Progress Notes (Signed)
Pain better controlled on current meds. Had BM last night, per her report. Still too weak to walk on her own.  Very frustrated because she's npo and procedure"keeps getting postponed." Not happy with some nurse this floor-- I suggested she discuss w head nurse.  Aware that she will need placement at least temporarily and IV ABX as per ID  She is scheduled for chemo to resume 6/5 in our office. I will see her same day. Otherwise will follow with you while in house.  Please let me know if I can be of further help.

## 2015-09-21 NOTE — Op Note (Signed)
INDICATIONS: Bacteremia  PROCEDURE:   Informed consent was obtained prior to the procedure. The risks, benefits and alternatives for the procedure were discussed and the patient comprehended these risks.  Risks include, but are not limited to, cough, sore throat, vomiting, nausea, somnolence, esophageal and stomach trauma or perforation, bleeding, low blood pressure, aspiration, pneumonia, infection, trauma to the teeth and death.    After a procedural time-out, the oropharynx was anesthetized with 20% benzocaine spray.   During this procedure the patient was administered IV propofol (Dr. Ola Spurr) to achieve and maintain moderate conscious sedation.  The patient's heart rate, blood pressure, and oxygen saturation were monitored continuously during the procedure.   The transesophageal probe was inserted in the esophagus and stomach without difficulty and multiple views were obtained.  The patient was kept under observation until the patient left the procedure room.  The patient left the procedure room in stable condition.   Agitated microbubble saline contrast was not administered.  COMPLICATIONS:    There were no immediate complications.  FINDINGS:  No vegetations seen. Normal LVEF. No significant valvular abnormalities.    Time Spent Directly with the Patient:  30 minutes   Krisanne Lich 09/21/2015, 10:29 AM

## 2015-09-21 NOTE — Anesthesia Preprocedure Evaluation (Addendum)
Anesthesia Evaluation  Patient identified by MRN, date of birth, ID band Patient awake    Reviewed: Allergy & Precautions, NPO status , Patient's Chart, lab work & pertinent test results, reviewed documented beta blocker date and time   Airway Mallampati: II  TM Distance: >3 FB Neck ROM: Full    Dental no notable dental hx.    Pulmonary pneumonia, former smoker,    Pulmonary exam normal breath sounds clear to auscultation       Cardiovascular hypertension, Pt. on medications and Pt. on home beta blockers Normal cardiovascular exam Rhythm:Regular Rate:Normal  Echo 09/13/2015 - Left ventricle: The cavity size was normal. There was mild focal  basal hypertrophy of the septum. Systolic function was normal.  The estimated ejection fraction was in the range of 50% to 55%.  Wall motion was normal; there were no regional wall motion abnormalities. Doppler parameters are consistent with abnormal left ventricular relaxation (grade 1 diastolic dysfunction). - Aortic valve: There was trivial regurgitation. - Left atrium: The atrium was mildly dilated. - Pericardium, extracardiac: There was a left pleural effusion.   Neuro/Psych    GI/Hepatic hiatal hernia, GERD  ,  Endo/Other    Renal/GU Renal disease     Musculoskeletal  (+) Arthritis ,   Abdominal   Peds  Hematology   Anesthesia Other Findings   Reproductive/Obstetrics                           Anesthesia Physical Anesthesia Plan  ASA: II  Anesthesia Plan: MAC   Post-op Pain Management:    Induction: Intravenous  Airway Management Planned:   Additional Equipment:   Intra-op Plan:   Post-operative Plan:   Informed Consent: I have reviewed the patients History and Physical, chart, labs and discussed the procedure including the risks, benefits and alternatives for the proposed anesthesia with the patient or authorized representative who  has indicated his/her understanding and acceptance.   Dental advisory given  Plan Discussed with: CRNA  Anesthesia Plan Comments:         Anesthesia Quick Evaluation                                  Anesthesia Evaluation  Patient identified by MRN, date of birth, ID band Patient awake    Reviewed: Allergy & Precautions, H&P , NPO status , Patient's Chart, lab work & pertinent test results  History of Anesthesia Complications Negative for: history of anesthetic complications  Airway Mallampati: II TM Distance: >3 FB Neck ROM: Full    Dental  (+) Poor Dentition, Loose, Partial Lower, Partial Upper and Dental Advisory Given   Pulmonary neg pulmonary ROS, former smoker,  breath sounds clear to auscultation        Cardiovascular Rhythm:Regular Rate:Normal     Neuro/Psych    GI/Hepatic hiatal hernia, GERD-  Medicated,  Endo/Other    Renal/GU      Musculoskeletal   Abdominal   Peds  Hematology   Anesthesia Other Findings hx metastatic breast CA, s/p radiation Rx  Reproductive/Obstetrics                           Anesthesia Physical Anesthesia Plan  ASA: III  Anesthesia Plan: MAC   Post-op Pain Management:    Induction: Intravenous  Airway Management Planned: Simple Face Mask  Additional Equipment:   Intra-op  Plan:   Post-operative Plan:   Informed Consent: I have reviewed the patients History and Physical, chart, labs and discussed the procedure including the risks, benefits and alternatives for the proposed anesthesia with the patient or authorized representative who has indicated his/her understanding and acceptance.   Dental advisory given  Plan Discussed with:   Anesthesia Plan Comments: (Dr. [name] made aware of pt's concern for her loose incisors, due to scope passage and bite block.)        Anesthesia Quick Evaluation

## 2015-09-21 NOTE — Progress Notes (Signed)
Daily Progress Note   Patient Name: [name]       Date: 09/21/2015 DOB: March 20, 1946  Age: 70 y.o. MRN#: [id number] Attending Physician: Jonetta Osgood, MD Primary Care Physician: Curlene Labrum, MD Admit Date: 09/14/2015  Reason for Consultation/Follow-up: Pain control Life limiting illness: breast cancer, bone mets, admitted with SIRS and PNA Subjective: Sitting in bed. In no distress.  Seems to be in better spirits and reports being happy that "things are finally happening."  States that her pain is better overall, but she is still having some pain in right ribs.  Has not gotten any prn since this AM.  She reports that she is having pain and would like additional medication.    D/w her nurse and she has been declining need for additional medication during nursing rounds.  I asked her bedside nurse to continue to be proactive in asking if she is in pain as it seems that she will deny pain but report later that she was actually having pain that was not effectively treated.    Length of Stay: 7  Current Medications: Scheduled Meds:  . dexamethasone  2 mg Oral Q12H  . docusate sodium  100 mg Oral Daily  . feeding supplement  1 Container Oral BID BM  . fentaNYL  50 mcg Transdermal Q72H  . lidocaine  1 patch Transdermal Q24H  . pantoprazole  40 mg Oral Daily  . polyethylene glycol  17 g Oral Daily  . vancomycin  500 mg Intravenous Q12H  . warfarin  2 mg Oral ONCE-1800  . Warfarin - Pharmacist Dosing Inpatient   Does not apply q1800    Continuous Infusions:    PRN Meds: gi cocktail, HYDROmorphone (DILAUDID) injection, magic mouthwash w/lidocaine, morphine, ondansetron (ZOFRAN) IV, polyvinyl alcohol, senna-docusate  Physical Exam         NAD S1 S2 Clear No edema Awake  alert  Vital Signs: BP 128/78 mmHg  Pulse 72  Temp(Src) 98.5 F (36.9 C) (Oral)  Resp 18  Ht 5' 2"  (1.575 m)  Wt 59.648 kg (131 lb 8 oz)  BMI 24.05 kg/m2  SpO2 100% SpO2: SpO2: 100 % O2 Device: O2 Device: Nasal Cannula O2 Flow Rate: O2 Flow Rate (L/min): 2 L/min  Intake/output summary:   Intake/Output Summary (Last 24 hours) at 09/21/15 1649 Last  data filed at 09/21/15 1029  Gross per 24 hour  Intake    500 ml  Output      0 ml  Net    500 ml   LBM: Last BM Date: 09/21/15 Baseline Weight: Weight: 59.648 kg (131 lb 8 oz) Most recent weight: Weight: 59.648 kg (131 lb 8 oz)       Palliative Assessment/Data:    Flowsheet Rows        Most Recent Value   Intake Tab    Referral Department  Hospitalist   Unit at Time of Referral  Oncology Unit   Palliative Care Primary Diagnosis  Cancer   Date Notified  09/16/15   Palliative Care Type  Return patient Palliative Care   Reason for referral  Pain   Date of Admission  09/14/15   Date first seen by Palliative Care  09/17/15   # of days Palliative referral response time  1 Day(s)   # of days IP prior to Palliative referral  2   Clinical Assessment    Palliative Performance Scale Score  50%   Pain Max last 24 hours  6   Pain Min Last 24 hours  4   Dyspnea Max Last 24 Hours  4   Dyspnea Min Last 24 hours  3   Nausea Max Last 24 Hours  3   Nausea Min Last 24 Hours  2   Psychosocial & Spiritual Assessment    Palliative Care Outcomes    Patient/Family meeting held?  Yes   Who was at the meeting?  patient herself   Palliative Care follow-up planned  Yes, Facility      Patient Active Problem List   Diagnosis Date Noted  . Bacteremia   . Rib pain on left side   . MRSA bacteremia   . Staphylococcus aureus bacteremia with sepsis ([place])   . Neoplasm related pain   . Encounter for palliative care   . Generalized abdominal pain   . Elevated LFTs   . SIRS (systemic inflammatory response syndrome) ([place]) 09/14/2015  . Pain  from bone metastases ([place]) 06/28/2015  . Cancer of overlapping sites of right breast ([place]) 05/11/2015  . Bilateral breast cancer ([place]) 05/11/2015  . C. difficile colitis 02/08/2015  . Protein-calorie malnutrition, severe ([place]) 01/25/2015  . Clostridium difficile diarrhea   . Hypokalemia 01/24/2015  . AKI (acute kidney injury) ([place]) 01/24/2015  . Essential hypertension 01/24/2015  . Abdominal pain   . Dehydration 09/09/2014  . HCAP (healthcare-associated pneumonia) 08/09/2014  . Cavitating mass in left upper lung lobe 08/09/2014  . Anorexia 03/02/2013  . Weight loss due to medication 03/02/2013  . Swollen R ankle 08/10/2012  . Fracture of ankle, medial malleolus, right, closed 08/10/2012  . Rotator cuff tear 09/24/2011  . Breast cancer metastasized to bone ([place]) 04/30/2011  . Rash 03/01/2011  . Seroma, post-traumatic 12/25/2010  . RECTAL BLEEDING 12/08/2009  . FLATULENCE 12/08/2009  . OTH NONSPC ABN FINDNG RAD&OTH EXM BODY STRUCTURE 01/10/2009  . GERD 12/26/2008  . Osteoarthritis 12/26/2008  . Nausea without vomiting 12/26/2008  . Diarrhea 12/26/2008  . Personal history of thrombophlebitis 12/26/2008  . COLONIC POLYPS, HX OF 12/26/2008    Palliative Care Assessment & Plan   Assessment:  breast ca met dz to bone Uncontrolled pain Admitted for SIRS PNA   Recommendations/Plan:  Remains on antibiotics as per TRH.   Pain: Reports pain has been doing better since increase in Fentanyl patch to 58mg/hr. I do think that  she has a large component of pain from existential suffering and loss of her independence.  I discussed again with her today using her rescue medication regularly as well as premedicating prior to activities that cause pain.  She has received only one dose of prn medication today.  Continue current pain regimen.  Would also consider also trial of bisphosphonate if she is not improved following radiation therapy.        Code Status:    Code Status Orders         Start     Ordered   09/14/15 1820  Full code   Continuous     09/14/15 1819    Code Status History    Date Active Date Inactive Code Status Order ID Comments User Context   01/24/2015  6:59 PM 01/28/2015  7:06 PM Full Code [id number]  Waldemar Dickens, MD Inpatient   08/09/2014  1:31 PM 08/11/2014  7:05 PM Full Code [id number]  Melton Alar, PA-C Inpatient    Advance Directive Documentation        Most Recent Value   Type of Advance Directive  Healthcare Power of [place], Living will   Pre-existing out of facility DNR order (yellow form or pink MOST form)     "MOST" Form in Place?         Prognosis:   Unable to determine but appears guarded, see above.   Discharge Planning:  To Be Determined  Care plan was discussed with  patient  Thank you for allowing the Palliative Medicine Team to assist in the care of this patient.   Time In: 1605 Time Out: 1635 Total Time 25 Prolonged Time Billed  no       Greater than 50%  of this time was spent counseling and coordinating care related to the above assessment and plan.  Micheline Rough, MD [phone] Please contact Palliative Medicine Team phone at [phone] for questions and concerns.

## 2015-09-21 NOTE — Progress Notes (Signed)
ANTICOAGULATION CONSULT NOTE - Follow-Up  Pharmacy Consult for Warfarin Indication: hx extensive superficial vein thrombosis  Allergies  Allergen Reactions  . Aspirin Nausea And Vomiting  . Boost Pudding [Nutritional Supplements] Diarrhea  . Percodan [Oxycodone-Aspirin] Nausea Only  . Codeine Nausea Only   Patient Measurements: Height: 5\' 2"  (157.5 cm) Weight: 131 lb 8 oz (59.648 kg) IBW/kg (Calculated) : 50.1  Vital Signs: Temp: 98.4 F (36.9 C) (05/25 0504) Temp Source: Oral (05/25 0504) BP: 132/85 mmHg (05/25 0504) Pulse Rate: 81 (05/25 0504)  Labs:  Recent Labs  09/19/15 0516 09/20/15 0539 09/21/15 0505  LABPROT 29.2* 23.5* 22.9*  INR 2.93* 2.11* 2.04*  CREATININE  --   --  0.50    Estimated Creatinine Clearance: 51 mL/min (by C-G formula based on Cr of 0.5).  Medications:  Scheduled:  . dexamethasone  2 mg Oral Q12H  . docusate sodium  100 mg Oral Daily  . feeding supplement  1 Container Oral BID BM  . fentaNYL  50 mcg Transdermal Q72H  . lidocaine  1 patch Transdermal Q24H  . pantoprazole  40 mg Oral Daily  . polyethylene glycol  17 g Oral Daily  . vancomycin  500 mg Intravenous Q12H  . Warfarin - Pharmacist Dosing Inpatient   Does not apply q1800   Assessment: 36 yoF with hx of mets breast cancer on letrozole+ palbocicib, denosumab with plan for PAC placement on 5/18 for chemo to start in June. She was also on warfarin PTA for hx extensive vein thrombosis. Outpatient records patient was supposed to hold her warfarin three days prior to [facility] procedure.  However,  her INR was supratherapeutic at 9 on 5/10 and patient was instructed to hold her warfarin at that time.  Patient has not gotten PAC yet. She presented to the ED on 5/18 with c/o fever, chills, cough and worsening weakness. INR was 1.90 on admission  Home warfarin regimen: 2 mg daily except 4 mg on FSS   Today, 09/21/2015: - INR this AM is therapeutic @ 2.04, down from 2.11 on 5/24. - Most recent  dose is 2 mg on 5/24, dose was held 5/23.  - CBC: Hgb stable, pltc improved (112->148) on 5/21  - No bleeding documented - Drug-drug intxns: abx - Cardiac diet, thin liquid - Pt is undergoing TEE today at [facility]   Goal of Therapy:  INR 2-3 Monitor platelets by anticoagulation protocol: Yes   Plan:  - Give warfarin 2 mg x 1 today to match home dose and maintain pt in therapeutic range. - Daily INR/PTT - Monitor for s/s bleeding  Myrna Blazer, PharmD Candidate  09/21/2015 7:41 AM

## 2015-09-21 NOTE — Interval H&P Note (Signed)
History and Physical Interval Note:  09/21/2015 9:47 AM  [name]  has presented today for surgery, with the diagnosis of Bacteremia  The various methods of treatment have been discussed with the patient and family. After consideration of risks, benefits and other options for treatment, the patient has consented to  Procedure(s): TRANSESOPHAGEAL ECHOCARDIOGRAM (TEE) (N/A) as a surgical intervention .  The patient's history has been reviewed, patient examined, no change in status, stable for surgery.  I have reviewed the patient's chart and labs.  Questions were answered to the patient's satisfaction.     Estell Puccini

## 2015-09-21 NOTE — Care Management Note (Signed)
Case Management Note  Patient Details  Name: [name] MRN: [id number] Date of Birth: [birth year]  Subjective/Objective: TEE @ [facility] today. D/c plan SNF in am.                   Action/Plan:d/c SNF.   Expected Discharge Date:   (unknown)               Expected Discharge Plan:  Skilled Nursing Facility  In-House Referral:  Clinical Social Work  Discharge planning Services  CM Consult  Post Acute Care Choice:    Choice offered to:     DME Arranged:    DME Agency:     HH Arranged:    [place] Agency:     Status of Service:  In process, will continue to follow  Medicare Important Message Given:  Yes Date Medicare IM Given:    Medicare IM give by:    Date Additional Medicare IM Given:    Additional Medicare Important Message give by:     If discussed at [place] of Stay Meetings, dates discussed:    Additional Comments:  Dessa Phi, RN 09/21/2015, 1:10 PM

## 2015-09-21 NOTE — Anesthesia Postprocedure Evaluation (Signed)
Anesthesia Post Note  Patient: AVIGAYIL FEHL  Procedure(s) Performed: Procedure(s) (LRB): TRANSESOPHAGEAL ECHOCARDIOGRAM (TEE) (N/A)  Patient location during evaluation: PACU Anesthesia Type: General Level of consciousness: awake and alert Pain management: pain level controlled Vital Signs Assessment: post-procedure vital signs reviewed and stable Respiratory status: spontaneous breathing, nonlabored ventilation and respiratory function stable Cardiovascular status: blood pressure returned to baseline and stable Postop Assessment: no signs of nausea or vomiting Anesthetic complications: no    Last Vitals:  Filed Vitals:   09/21/15 1050 09/21/15 1123  BP:  128/78  Pulse: 64 72  Temp:  36.9 C  Resp: 19 18    Last Pain:  Filed Vitals:   09/21/15 1144  PainSc: 3                  Tiajuana Amass

## 2015-09-21 NOTE — Transfer of Care (Signed)
Immediate Anesthesia Transfer of Care Note  Patient: [name]  Procedure(s) Performed: Procedure(s): TRANSESOPHAGEAL ECHOCARDIOGRAM (TEE) (N/A)  Patient Location: PACU  Anesthesia Type:MAC  Level of Consciousness: awake  Airway & Oxygen Therapy: Patient Spontanous Breathing  Post-op Assessment: Report given to RN and Post -op Vital signs reviewed and stable  Post vital signs: Reviewed and stable  Last Vitals:  Filed Vitals:   09/21/15 0943 09/21/15 1035  BP: 120/78 98/47  Pulse: 77 58  Temp: 36.8 C 36.7 C  Resp: 20 24    Last Pain:  Filed Vitals:   09/21/15 1036  PainSc: 8       Patients Stated Pain Goal: 3 ([id number])  Complications: No apparent anesthesia complications

## 2015-09-21 NOTE — Telephone Encounter (Signed)
Received a VM on Oral Oncology Clinic line from [place] with [facility]ette on 09/20/15 @ 16:11. They were wanting to know if patient was continuing treatment with [facility].  Per 09/06/15 MD office note, [name] has been discontinued.  I returned call to [employer] Moshe [place]) at [phone]. Informed that [name] has been discontinued for this patient.  Raul Del, PharmD, [place], [facility] [phone]

## 2015-09-21 NOTE — Progress Notes (Signed)
Pharmacy Antibiotic Note  [name] is a 70 y.o. female with history of metastatic breast cancer, admitted with healthcare associated pneumonia, generalized weakness and generalized bony pain.Preliminary blood cultures done at [facility] on 5/18 are positive for gm + cocci (2 out of 2 sets-confirmed as MRSA). Patient is currently on vancomycin for MRSA bacteremia with plan for possible PICC placement. CT w/o contrast on 5/24 showed increased bilateral infiltrates compared to scan 9 days prior. TEE completed on 5/25 negative for vegetation.  Plan:  Continue vancomycin 500mg  IV q12h pending trough results Check trough prior to 5/25 1800 dose; goal 15-20 mcg/mL Monitor renal function & final culture data Follow up ID recommendations  Height: 5\' 2"  (157.5 cm) Weight: 131 lb 8 oz (59.648 kg) IBW/kg (Calculated) : 50.1  Temp (24hrs), Avg:98.4 F (36.9 C), Min:98 F (36.7 C), Max:98.9 F (37.2 C)   Recent Labs Lab 09/15/15 0521 09/16/15 0532 09/17/15 0530 09/21/15 0505  WBC 5.9  --  7.4  --   CREATININE 0.76 0.62 0.64 0.50    Estimated Creatinine Clearance: 51 mL/min (by C-G formula based on Cr of 0.5).    Allergies  Allergen Reactions  . Aspirin Nausea And Vomiting  . Boost Pudding [Nutritional Supplements] Diarrhea  . Percodan [Oxycodone-Aspirin] Nausea Only  . Codeine Nausea Only    Antimicrobials this admission: 5/18 Cefepime >> 5/23 5/18 Vancomycin >> 5/21, resume 5/23 >> 5/23 Ancef >>5/23  Levels/dose changes this admission:  Microbiology results: 5/18 BCx: 2/2 GPC per report, (+) for MRSA 5/18 BCx x2: NGTD 5/18 HIV antb: NR 5/18 ur strep pneu: neg 5/18 ur legionella: neg  Thank you for allowing pharmacy to be a part of this patient's care.  Myrna Blazer, PharmD Candidate  09/21/2015 1:42 PM

## 2015-09-21 NOTE — Progress Notes (Signed)
Pharmacy Consult Note - vancomycin follow up  Labs: vanc trough 11  A/P: Vanc trough subtherapeutic (goal 15-20) on dose 500mg  IV q12 but this was prior to 4th dose so dose is not quite at steady yet. Will increase dose slightly to 750mg  IV q12 due to MRSA bacteremia indication and recheck another trough at next steady state  Adrian Saran, PharmD, BCPS Pager [phone] 09/21/2015 6:54 PM

## 2015-09-21 NOTE — Progress Notes (Addendum)
PROGRESS NOTE        PATIENT DETAILS Name: [name] Age: 70 y.o. Sex: female Date of Birth: 01-12-1946 Admit Date: 09/14/2015 Admitting Physician Evalee Mutton Kristeen Mans, MD ZJ:[id number] E, MD  Brief Narrative: Patient is a 70 y.o. female with history of metastatic breast cancer, admitted with healthcare associated pneumonia, generalized weakness and generalized bony pain.Blood cultures done at [facility] on 5/18 are positive for MRSA  Subjective: Frustrated-because of delay in TEE and other nursing issues. But overall improving, patient seems to be much better today.  Assessment/Plan: Principal Problem: SIR's secondary to HCAP (healthcare-associated pneumonia) with cavitary lesions and MRSA bacteremia:Clinically improved- afebrile. Blood cultures done at [facility] on 5/18 are positive for MRSA.  Blood cultures done here are negative,TEE on 5/25 negative for vegetations, however CT Chest on 5/24 positive for cavitary lung lesions likely lung abscess from septic embolism. Await ID follow regarding duration of Vancomycin and timing of PICC line placement.If ok likely will be d/c to SNF likely 5/26.   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 Unfortunately, her deconditioning has only worsened due to acute illness and pneumonia. PT evaluation completed, recommendations are for SNF-patient is agreeable.  Generalized/Diffuse Pain: suspect secondary to multiple osseous mets, although still persistent she is seems more comfortable today with Decadron, Fentanyl transdermal and prn IV Dilaudid and prn MSIR. May need to be started on bisphosphonates if pain is still an issue in the next few days. Appreciate palliative care evaluation.  Mildly elevated LFTs: Now  normalized-likely due to acute infection, but given history of malignancy-liver metastases is a cause of concern. RUQ ultrasound no obvious metastatic lesions. Recent PET scan on 08/31/15 did not show any major hepatic lesions as well. For now continue to follow.  Epigastric/lower chest pain: better-described it as "indigestion"-continue PPI, and prn GI cocktail. EKG-no acute/new changes. Suspect no further work up required at this point-except following clinical course  Hypokalemia:repleted   Anemia: Likely secondary to chronic disease-worsened by acute illness, no evidence of blood loss. Follow CBC  HTN: Continue to hold both Coreg and lisinopril-but pressure currently controlled without antihypertensives.  History of extensive superficial venous thrombosis: Maintained on coumadin at home-continue per pharmacy  History of metastatic breast cancer: Recent PET scan on 08/31/15 showed numerous osseous mets-followed by Dr [name]. Recent MRI entire spine on 4/28 showed multiple osseous spinal mets with no cord compression.Obviously with fever/PNA-plans for chemo and port placement will need to be postponed.Seen by Oncology -with tentative plans are to start  cyclophosphamide, methotrexate, and fluorouracil on 10/02/2015. Also seen by Rad Onc-likely to undergo simulation 5/25, and palliative RTx next week.  Anorexia: Likely secondary to  underlying malignancy worsened by pneumonia. Continue IV Abx, if no improvement, will start Marinol  Protein-calorie malnutrition, severe: Continue supplements  DVT Prophylaxis: Full dose anticoagulation on Coumadin  Code Status: Full code   Family Communication: None at bedside  Disposition Plan: Remain inpatient-SNF on discharge likely 5/26  Antimicrobial agents: IV Vancomycin 5/23>> IV Ancef 5/23>>5/23 IV Vancomycin 5/18>>5/21 IV Cefepime 5/18>>5/23   Procedures: Echo 5/19>>EF 50-55%  CONSULTS:  ID  Oncology Palliative  Rad Onc  Time  spent: 25 minutes-Greater than 50% of this time was spent in  counseling, explanation of diagnosis, planning of further management, and coordination of care.  MEDICATIONS: Anti-infectives    Start     Dose/Rate Route Frequency Ordered Stop   09/20/15 0600  vancomycin (VANCOCIN) 500 mg in sodium chloride 0.9 % 100 mL IVPB     500 mg 100 mL/hr over 60 Minutes Intravenous Every 12 hours 09/19/15 1212     09/19/15 1800  amoxicillin-clavulanate (AUGMENTIN) 875-125 MG per tablet 1 tablet  Status:  Discontinued     1 tablet Oral Every 12 hours 09/19/15 1106 09/19/15 1148   09/19/15 1400  ceFAZolin (ANCEF) IVPB 2g/100 mL premix  Status:  Discontinued     2 g 200 mL/hr over 30 Minutes Intravenous Every 8 hours 09/19/15 1211 09/19/15 1651   09/19/15 1300  vancomycin (VANCOCIN) IVPB 1000 mg/200 mL premix     1,000 mg 200 mL/hr over 60 Minutes Intravenous  Once 09/19/15 1211 09/19/15 1325   09/15/15 0800  vancomycin (VANCOCIN) 500 mg in sodium chloride 0.9 % 100 mL IVPB  Status:  Discontinued     500 mg 100 mL/hr over 60 Minutes Intravenous Every 12 hours 09/14/15 1838 09/17/15 1106   09/15/15 0600  ceFEPIme (MAXIPIME) 1 g in dextrose 5 % 50 mL IVPB  Status:  Discontinued     1 g 100 mL/hr over 30 Minutes Intravenous Every 12 hours 09/14/15 1819 09/19/15 1105   09/14/15 2000  vancomycin (VANCOCIN) 1,250 mg in sodium chloride 0.9 % 250 mL IVPB     1,250 mg 166.7 mL/hr over 90 Minutes Intravenous  Once 09/14/15 1836 09/15/15 0026   09/14/15 1845  ceFEPIme (MAXIPIME) 2 g in dextrose 5 % 50 mL IVPB     2 g 100 mL/hr over 30 Minutes Intravenous  Once 09/14/15 1831 09/14/15 1918      Scheduled Meds: . dexamethasone  2 mg Oral Q12H  . docusate sodium  100 mg Oral Daily  . feeding supplement  1 Container Oral BID BM  . fentaNYL  50 mcg Transdermal Q72H  . lidocaine  1 patch Transdermal Q24H  . pantoprazole  40 mg Oral Daily  . polyethylene glycol  17 g Oral Daily  . vancomycin  500 mg Intravenous  Q12H  . Warfarin - Pharmacist Dosing Inpatient   Does not apply q1800   Continuous Infusions: . sodium chloride     PRN Meds:.gi cocktail, HYDROmorphone (DILAUDID) injection, magic mouthwash w/lidocaine, morphine, ondansetron (ZOFRAN) IV, polyvinyl alcohol, senna-docusate   PHYSICAL EXAM: Vital signs: Filed Vitals:   09/21/15 0943 09/21/15 1035 09/21/15 1040 09/21/15 1050  BP: 120/78 98/47 127/67   Pulse: 77 58 71 64  Temp: 98.3 F (36.8 C) 98 F (36.7 C)    TempSrc: Oral Oral    Resp: 20 24 21 19   Height:      Weight:      SpO2: 100% 96% 96% 100%   Filed Weights   09/14/15 1730  Weight: 59.648 kg (131 lb 8 oz)   Body mass index is 24.05 kg/(m^2).   Gen Exam: Awake and alert with clear speech.Chronically sick looking. Neck: Supple, No JVD.   Chest: B/L Clear.   CVS: S1 S2 Regular, no murmurs.  Abdomen: soft, BS +, mildly tender in epigastric area- non distended.  Extremities: no edema, lower extremities warm to touch. Neurologic: Non Focal-but exam limited due to pain. Skin: No Rash or lesions   Wounds: N/A.    LABORATORY DATA: CBC:  Recent Labs Lab 09/15/15 0521 09/17/15 0530  WBC 5.9 7.4  HGB 8.7* 9.1*  HCT 26.7* 27.6*  MCV 102.7* 103.0*  PLT 112* 148*    Basic Metabolic Panel:  Recent Labs Lab 09/15/15 0521 09/16/15 0532 09/17/15 0530 09/21/15 0505  NA 141 138 139 138  K 3.3* 3.1* 3.8 3.9  CL 109 103 100* 101  CO2 24 27 28 30   GLUCOSE 75 101* 149* 126*  BUN 25* 18 26* 23*  CREATININE 0.76 0.62 0.64 0.50  CALCIUM 7.8* 8.6* 9.0 8.3*    GFR: Estimated Creatinine Clearance: 51 mL/min (by C-G formula based on Cr of 0.5).  Liver Function Tests:  Recent Labs Lab 09/15/15 0521 09/16/15 0532  AST 70* 38  ALT 69* 54  ALKPHOS 153* 142*  BILITOT 1.4* 0.8  PROT 5.3* 4.9*  ALBUMIN 2.1* 1.9*   No results for input(s): LIPASE, AMYLASE in the last 168 hours. No results for input(s): AMMONIA in the last 168 hours.  Coagulation  Profile:  Recent Labs Lab 09/17/15 0530 09/18/15 0508 09/19/15 0516 09/20/15 0539 09/21/15 0505  INR 1.66* 2.17* 2.93* 2.11* 2.04*    Cardiac Enzymes: No results for input(s): CKTOTAL, CKMB, CKMBINDEX, TROPONINI in the last 168 hours.  BNP (last 3 results) No results for input(s): PROBNP in the last 8760 hours.  HbA1C: No results for input(s): HGBA1C in the last 72 hours.  CBG: No results for input(s): GLUCAP in the last 168 hours.  Lipid Profile: No results for input(s): CHOL, HDL, LDLCALC, TRIG, CHOLHDL, LDLDIRECT in the last 72 hours.  Thyroid Function Tests: No results for input(s): TSH, T4TOTAL, FREET4, T3FREE, THYROIDAB in the last 72 hours.  Anemia Panel: No results for input(s): VITAMINB12, FOLATE, FERRITIN, TIBC, IRON, RETICCTPCT in the last 72 hours.  Urine analysis:    Component Value Date/Time   COLORURINE YELLOW 09/14/2015 1834   APPEARANCEUR CLOUDY* 09/14/2015 1834   LABSPEC 1.013 09/14/2015 1834   PHURINE 5.5 09/14/2015 1834   GLUCOSEU NEGATIVE 09/14/2015 1834   HGBUR LARGE* 09/14/2015 1834   BILIRUBINUR NEGATIVE 09/14/2015 1834   KETONESUR NEGATIVE 09/14/2015 1834   PROTEINUR 30* 09/14/2015 1834   UROBILINOGEN 0.2 01/23/2015 1830   NITRITE POSITIVE* 09/14/2015 1834   LEUKOCYTESUR NEGATIVE 09/14/2015 1834    Sepsis Labs: Lactic Acid, Venous No results found for: LATICACIDVEN  MICROBIOLOGY: Recent Results (from the past 240 hour(s))  Culture, blood (routine x 2)     Status: None   Collection Time: 09/14/15  6:35 PM  Result Value Ref Range Status   Specimen Description BLOOD RIGHT ARM  Final   Special Requests BOTTLES DRAWN AEROBIC AND ANAEROBIC [place]  Final   Culture   Final    NO GROWTH 5 DAYS Performed at [facility]    Report Status 09/19/2015 FINAL  Final  Culture, blood (routine x 2)     Status: None   Collection Time: 09/14/15  6:40 PM  Result Value Ref Range Status   Specimen Description BLOOD LEFT HAND  Final   Special  Requests BOTTLES DRAWN AEROBIC AND ANAEROBIC 5CC  Final   Culture   Final    NO GROWTH 5 DAYS Performed at [facility]    Report Status 09/19/2015 FINAL  Final    RADIOLOGY STUDIES/RESULTS: Ct Chest Wo Contrast  09/20/2015  CLINICAL DATA:  Pneumonia. Left-sided chest pain. Personal history of breast cancer with bone metastases. EXAM: CT CHEST WITHOUT CONTRAST TECHNIQUE: Multidetector CT imaging of the chest was performed following the standard protocol without IV contrast. COMPARISON:  Chest  radiography 09/15/2015.  Chest CT 09/11/2015. FINDINGS: Again demonstrated is benign appearing pleural and parenchymal scarring at both lung apices. There is a background pattern of centrilobular emphysema particularly evident in the upper lobes. Patient has developed multiple widely disseminated cavitary infiltrates. There are 2 adjacent regions of cavitary infiltration in the left upper lobe responsible for the chest radiographic finding. The larger area measures 3.6 cm in diameter. The smaller area measures 2.8 cm in diameter. There is no pleural or pericardial fluid. No enlarged hilar or mediastinal lymph nodes are identified. Widespread bony metastatic changes are present as seen previously. Scans in the upper abdomen do not show any acute finding. A region of hazy infiltrate in the right lower lobe with 2 areas of cavitary infiltration is newly developed. The larger region measures 3.5 cm in diameter in the smaller region measures 1.4 cm in diameter. IMPRESSION: Development of multiple areas of cavitary infiltration in both the right and left lung, which have developed rapidly since a CT study of 9 days ago at which time day were not present at all. Lesions developing this rapidly must be inflammatory and could represent spontaneous pulmonary abscesses or could represent abscesses secondary to septic emboli. Certainly, atypical organisms must be considered including fungal disease. Electronically Signed    By: Nelson Chimes M.D.   On: 09/20/2015 20:41   Mr Jeri Cos [id number] Contrast  08/25/2015  CLINICAL DATA:  Ongoing headache. Arm pain and pain beneath the sternum and ribs for 2 weeks. Right leg pain. Metastatic breast cancer. EXAM: MRI HEAD WITHOUT AND WITH CONTRAST; MRI CERVICAL SPINE WITHOUT AND WITH CONTRAST; MRI LUMBAR SPINE WITHOUT AND WITH CONTRAST; MRI THORACIC SPINE WITHOUT AND WITH CONTRAST TECHNIQUE: Multiplanar, multiecho pulse sequences of the brain and surrounding structures were obtained according to standard protocol without and with intravenous contrast; Multiplanar and multiecho pulse sequences of the cervical spine, to include the craniocervical junction and cervicothoracic junction, were obtained according to standard protocol without and with intravenous contrast.; Multiplanar and multiecho pulse sequences of the lumbar spine were obtained without and with intravenous contrast.; Multiplanar and multiecho pulse sequences of the thoracic spine were obtained without and with intravenous contrast. CONTRAST:  38mL MULTIHANCE GADOBENATE DIMEGLUMINE 529 MG/ML IV SOLN COMPARISON:  Head CT 08/05/2015 and MRI 02/14/2009. Cervical spine MRI 05/14/2013. Thoracic and lumbar spine MRI 07/05/2015. FINDINGS: MR HEAD FINDINGS: There is no evidence of acute infarct, intracranial hemorrhage, mass, midline shift, or extra-axial fluid collection. There is mild cerebral atrophy. Small foci of T2 hyperintensity in the cerebral white matter bilaterally, stable to slightly increased from 2010 and nonspecific but compatible with mild-to-moderate chronic small vessel ischemic disease. No enhancing brain lesions are identified. There is at most minimally prominent dural enhancement without nodularity. Small enhancing lesions are present throughout the skull, progressive from 2010 (for example 3 cm left parietal lesion on image 28 of series 41). There is involvement of the clivus. Prior bilateral cataract extraction is noted.  There is a trace left mastoid effusion. No significant paranasal sinus inflammatory disease. Major intracranial vascular flow voids are preserved. MR CERVICAL SPINE FINDINGS: Cervical spine straightening is unchanged. There is no significant listhesis. There is progressive osseous metastatic disease throughout the cervical spine since the 2015 MRI. There is now complete marrow replacement involving the C4 vertebral body with patchy enhancement including a 1 cm focus posteriorly. There is also a left-sided posterior element involvement at C4 which is new. No epidural tumor or spinal cord compression is identified. No cervical spinal  cord signal abnormality is identified within limitations of motion artifact. Diffuse cervical disc degeneration is again seen. There is mild neural foraminal stenosis on the right at C3-4, on the left at C4-5, and likely bilaterally at C5-6 and C6-7. This is overall similar to the prior MRI. No significant spinal stenosis is seen. The paraspinal soft tissues are unremarkable. MR THORACIC SPINE FINDINGS: Diffuse osseous metastatic disease is again seen throughout the vertebral bodies and posterior elements of the thoracic spine. There has overall been mild progression from the 07/05/2015 thoracic spine MRI. There is now near complete involvement of the T10 vertebral body. There is mildly progressive marrow replacement involving multiple mid thoracic vertebral bodies as well as T1 and T2. The T3 vertebral body remains completely involved. There is a mild T12 superior endplate compression fracture which demonstrates slightly progressive height loss compared to the prior study. There is slightly prominent ventral epidural enhancement in the mid thoracic spine from T5 -T8, most conspicuous and mildly nodular in a left paracentral location at the T8 superior endplate level (series 37, image 33). This may reflect very small volume epidural tumor, prominent epidural venous plexus, or a  combination of both. This has likely not significantly changed from the prior thoracic spine MRI, although there is much more motion artifact on axial sequences on that study. The spinal cord is normal in caliber and signal. There is no spinal cord compression. Multilevel disc degeneration is again seen with disc bulging and small disc protrusions without significant spinal canal or neural foraminal stenosis. Small left adrenal nodule and mild left upper pole renal caliectasis or cysts are partially visualized, grossly similar to the 09/07/2014 PET-CT. MR LUMBAR SPINE FINDINGS: Vertebral alignment is unchanged, with trace anterolisthesis again seen of L3 on L4. Osseous lesions throughout the lumbar spine do not appear significantly changed from the prior lumbar spine MRI, with the largest lesion in the L1 vertebral body. S3 level sacral osseous metastatic disease is partially visualized and grossly similar to the prior study. 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 Lumbar vertebral body heights are preserved. There is diffuse lumbar disc desiccation. Severe disc space height loss at L4-5 is unchanged. Multilevel disc and facet degeneration does not appear significantly changed, with mild right foraminal stenosis again seen at L3-4. There is no spinal stenosis. No epidural tumor is identified. Conus medullaris is normal in signal and terminates at L1. IMPRESSION: 1. No acute intracranial abnormality. No evidence of brain metastases. 2. Numerous osseous skull metastases, progressed from 2010. 3. Widespread spinal osseous metastatic disease, with interval progression in the cervical and thoracic spine. No sizable epidural tumor identified. Slightly prominent ventral epidural enhancement in the mid thoracic spine is favored to mostly reflect epidural venous plexus, however trace epidural tumor is possible particularly at T8. 4. No spinal stenosis. 5. Mild T12 superior endplate compression fracture with  slightly progressive height loss from 07/05/2015. Electronically Signed   By: Logan Bores M.D.   On: 08/25/2015 17:08   Mr Cervical Spine W Wo Contrast  08/25/2015  CLINICAL DATA:  Ongoing headache. Arm pain and pain beneath the sternum and ribs for 2 weeks. Right leg pain. Metastatic breast cancer. EXAM: MRI HEAD WITHOUT AND WITH CONTRAST; MRI CERVICAL SPINE WITHOUT AND WITH CONTRAST; MRI LUMBAR SPINE WITHOUT AND WITH CONTRAST; MRI THORACIC SPINE WITHOUT AND WITH CONTRAST TECHNIQUE: Multiplanar, multiecho pulse sequences of the brain and surrounding structures were obtained according to standard protocol without and with intravenous contrast; Multiplanar and multiecho pulse sequences  of the cervical spine, to include the craniocervical junction and cervicothoracic junction, were obtained according to standard protocol without and with intravenous contrast.; Multiplanar and multiecho pulse sequences of the lumbar spine were obtained without and with intravenous contrast.; Multiplanar and multiecho pulse sequences of the thoracic spine were obtained without and with intravenous contrast. CONTRAST:  71mL MULTIHANCE GADOBENATE DIMEGLUMINE 529 MG/ML IV SOLN COMPARISON:  Head CT 08/05/2015 and MRI 02/14/2009. Cervical spine MRI 05/14/2013. Thoracic and lumbar spine MRI 07/05/2015. FINDINGS: MR HEAD FINDINGS: There is no evidence of acute infarct, intracranial hemorrhage, mass, midline shift, or extra-axial fluid collection. There is mild cerebral atrophy. Small foci of T2 hyperintensity in the cerebral white matter bilaterally, stable to slightly increased from 2010 and nonspecific but compatible with mild-to-moderate chronic small vessel ischemic disease. No enhancing brain lesions are identified. There is at most minimally prominent dural enhancement without nodularity. Small enhancing lesions are present throughout the skull, progressive from 2010 (for example 3 cm left parietal lesion on image 28 of series 41).  There is involvement of the clivus. Prior bilateral cataract extraction is noted. There is a trace left mastoid effusion. No significant paranasal sinus inflammatory disease. Major intracranial vascular flow voids are preserved. MR CERVICAL SPINE FINDINGS: Cervical spine straightening is unchanged. There is no significant listhesis. There is progressive osseous metastatic disease throughout the cervical spine since the 2015 MRI. There is now complete marrow replacement involving the C4 vertebral body with patchy enhancement including a 1 cm focus posteriorly. There is also a left-sided posterior element involvement at C4 which is new. No epidural tumor or spinal cord compression is identified. No cervical spinal cord signal abnormality is identified within limitations of motion artifact. Diffuse cervical disc degeneration is again seen. There is mild neural foraminal stenosis on the right at C3-4, on the left at C4-5, and likely bilaterally at C5-6 and C6-7. This is overall similar to the prior MRI. No significant spinal stenosis is seen. The paraspinal soft tissues are unremarkable. MR THORACIC SPINE FINDINGS: Diffuse osseous metastatic disease is again seen throughout the vertebral bodies and posterior elements of the thoracic spine. There has overall been mild progression from the 07/05/2015 thoracic spine MRI. There is now near complete involvement of the T10 vertebral body. There is mildly progressive marrow replacement involving multiple mid thoracic vertebral bodies as well as T1 and T2. The T3 vertebral body remains completely involved. There is a mild T12 superior endplate compression fracture which demonstrates slightly progressive height loss compared to the prior study. There is slightly prominent ventral epidural enhancement in the mid thoracic spine from T5 -T8, most conspicuous and mildly nodular in a left paracentral location at the T8 superior endplate level (series 37, image 33). This may reflect  very small volume epidural tumor, prominent epidural venous plexus, or a combination of both. This has likely not significantly changed from the prior thoracic spine MRI, although there is much more motion artifact on axial sequences on that study. The spinal cord is normal in caliber and signal. There is no spinal cord compression. Multilevel disc degeneration is again seen with disc bulging and small disc protrusions without significant spinal canal or neural foraminal stenosis. Small left adrenal nodule and mild left upper pole renal caliectasis or cysts are partially visualized, grossly similar to the 09/07/2014 PET-CT. MR LUMBAR SPINE FINDINGS: Vertebral alignment is unchanged, with trace anterolisthesis again seen of L3 on L4. Osseous lesions throughout the lumbar spine do not appear significantly changed from the prior lumbar  spine MRI, with the largest lesion in the L1 vertebral body. S3 level sacral osseous metastatic disease is partially visualized and grossly similar to the prior study. 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 Lumbar vertebral body heights are preserved. There is diffuse lumbar disc desiccation. Severe disc space height loss at L4-5 is unchanged. Multilevel disc and facet degeneration does not appear significantly changed, with mild right foraminal stenosis again seen at L3-4. There is no spinal stenosis. No epidural tumor is identified. Conus medullaris is normal in signal and terminates at L1. IMPRESSION: 1. No acute intracranial abnormality. No evidence of brain metastases. 2. Numerous osseous skull metastases, progressed from 2010. 3. Widespread spinal osseous metastatic disease, with interval progression in the cervical and thoracic spine. No sizable epidural tumor identified. Slightly prominent ventral epidural enhancement in the mid thoracic spine is favored to mostly reflect epidural venous plexus, however trace epidural tumor is possible particularly at T8. 4. No  spinal stenosis. 5. Mild T12 superior endplate compression fracture with slightly progressive height loss from 07/05/2015. Electronically Signed   By: Logan Bores M.D.   On: 08/25/2015 17:08   Mr Thoracic Spine W Wo Contrast  08/25/2015  CLINICAL DATA:  Ongoing headache. Arm pain and pain beneath the sternum and ribs for 2 weeks. Right leg pain. Metastatic breast cancer. EXAM: MRI HEAD WITHOUT AND WITH CONTRAST; MRI CERVICAL SPINE WITHOUT AND WITH CONTRAST; MRI LUMBAR SPINE WITHOUT AND WITH CONTRAST; MRI THORACIC SPINE WITHOUT AND WITH CONTRAST TECHNIQUE: Multiplanar, multiecho pulse sequences of the brain and surrounding structures were obtained according to standard protocol without and with intravenous contrast; Multiplanar and multiecho pulse sequences of the cervical spine, to include the craniocervical junction and cervicothoracic junction, were obtained according to standard protocol without and with intravenous contrast.; Multiplanar and multiecho pulse sequences of the lumbar spine were obtained without and with intravenous contrast.; Multiplanar and multiecho pulse sequences of the thoracic spine were obtained without and with intravenous contrast. CONTRAST:  30mL MULTIHANCE GADOBENATE DIMEGLUMINE 529 MG/ML IV SOLN COMPARISON:  Head CT 08/05/2015 and MRI 02/14/2009. Cervical spine MRI 05/14/2013. Thoracic and lumbar spine MRI 07/05/2015. FINDINGS: MR HEAD FINDINGS: There is no evidence of acute infarct, intracranial hemorrhage, mass, midline shift, or extra-axial fluid collection. There is mild cerebral atrophy. Small foci of T2 hyperintensity in the cerebral white matter bilaterally, stable to slightly increased from 2010 and nonspecific but compatible with mild-to-moderate chronic small vessel ischemic disease. No enhancing brain lesions are identified. There is at most minimally prominent dural enhancement without nodularity. Small enhancing lesions are present throughout the skull, progressive from  2010 (for example 3 cm left parietal lesion on image 28 of series 41). There is involvement of the clivus. Prior bilateral cataract extraction is noted. There is a trace left mastoid effusion. No significant paranasal sinus inflammatory disease. Major intracranial vascular flow voids are preserved. MR CERVICAL SPINE FINDINGS: Cervical spine straightening is unchanged. There is no significant listhesis. There is progressive osseous metastatic disease throughout the cervical spine since the 2015 MRI. There is now complete marrow replacement involving the C4 vertebral body with patchy enhancement including a 1 cm focus posteriorly. There is also a left-sided posterior element involvement at C4 which is new. No epidural tumor or spinal cord compression is identified. No cervical spinal cord signal abnormality is identified within limitations of motion artifact. Diffuse cervical disc degeneration is again seen. There is mild neural foraminal stenosis on the right at C3-4, on the left at C4-5, and likely bilaterally  at C5-6 and C6-7. This is overall similar to the prior MRI. No significant spinal stenosis is seen. The paraspinal soft tissues are unremarkable. MR THORACIC SPINE FINDINGS: Diffuse osseous metastatic disease is again seen throughout the vertebral bodies and posterior elements of the thoracic spine. There has overall been mild progression from the 07/05/2015 thoracic spine MRI. There is now near complete involvement of the T10 vertebral body. There is mildly progressive marrow replacement involving multiple mid thoracic vertebral bodies as well as T1 and T2. The T3 vertebral body remains completely involved. There is a mild T12 superior endplate compression fracture which demonstrates slightly progressive height loss compared to the prior study. There is slightly prominent ventral epidural enhancement in the mid thoracic spine from T5 -T8, most conspicuous and mildly nodular in a left paracentral location at  the T8 superior endplate level (series 37, image 33). This may reflect very small volume epidural tumor, prominent epidural venous plexus, or a combination of both. This has likely not significantly changed from the prior thoracic spine MRI, although there is much more motion artifact on axial sequences on that study. The spinal cord is normal in caliber and signal. There is no spinal cord compression. Multilevel disc degeneration is again seen with disc bulging and small disc protrusions without significant spinal canal or neural foraminal stenosis. Small left adrenal nodule and mild left upper pole renal caliectasis or cysts are partially visualized, grossly similar to the 09/07/2014 PET-CT. MR LUMBAR SPINE FINDINGS: Vertebral alignment is unchanged, with trace anterolisthesis again seen of L3 on L4. Osseous lesions throughout the lumbar spine do not appear significantly changed from the prior lumbar spine MRI, with the largest lesion in the L1 vertebral body. S3 level sacral osseous metastatic disease is partially visualized and grossly similar to the prior study. 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 Lumbar vertebral body heights are preserved. There is diffuse lumbar disc desiccation. Severe disc space height loss at L4-5 is unchanged. Multilevel disc and facet degeneration does not appear significantly changed, with mild right foraminal stenosis again seen at L3-4. There is no spinal stenosis. No epidural tumor is identified. Conus medullaris is normal in signal and terminates at L1. IMPRESSION: 1. No acute intracranial abnormality. No evidence of brain metastases. 2. Numerous osseous skull metastases, progressed from 2010. 3. Widespread spinal osseous metastatic disease, with interval progression in the cervical and thoracic spine. No sizable epidural tumor identified. Slightly prominent ventral epidural enhancement in the mid thoracic spine is favored to mostly reflect epidural venous plexus,  however trace epidural tumor is possible particularly at T8. 4. No spinal stenosis. 5. Mild T12 superior endplate compression fracture with slightly progressive height loss from 07/05/2015. Electronically Signed   By: Logan Bores M.D.   On: 08/25/2015 17:08   Mr Lumbar Spine W Wo Contrast  08/25/2015  CLINICAL DATA:  Ongoing headache. Arm pain and pain beneath the sternum and ribs for 2 weeks. Right leg pain. Metastatic breast cancer. EXAM: MRI HEAD WITHOUT AND WITH CONTRAST; MRI CERVICAL SPINE WITHOUT AND WITH CONTRAST; MRI LUMBAR SPINE WITHOUT AND WITH CONTRAST; MRI THORACIC SPINE WITHOUT AND WITH CONTRAST TECHNIQUE: Multiplanar, multiecho pulse sequences of the brain and surrounding structures were obtained according to standard protocol without and with intravenous contrast; Multiplanar and multiecho pulse sequences of the cervical spine, to include the craniocervical junction and cervicothoracic junction, were obtained according to standard protocol without and with intravenous contrast.; Multiplanar and multiecho pulse sequences of the lumbar spine were obtained without and  with intravenous contrast.; Multiplanar and multiecho pulse sequences of the thoracic spine were obtained without and with intravenous contrast. CONTRAST:  55mL MULTIHANCE GADOBENATE DIMEGLUMINE 529 MG/ML IV SOLN COMPARISON:  Head CT 08/05/2015 and MRI 02/14/2009. Cervical spine MRI 05/14/2013. Thoracic and lumbar spine MRI 07/05/2015. FINDINGS: MR HEAD FINDINGS: There is no evidence of acute infarct, intracranial hemorrhage, mass, midline shift, or extra-axial fluid collection. There is mild cerebral atrophy. Small foci of T2 hyperintensity in the cerebral white matter bilaterally, stable to slightly increased from 2010 and nonspecific but compatible with mild-to-moderate chronic small vessel ischemic disease. No enhancing brain lesions are identified. There is at most minimally prominent dural enhancement without nodularity. Small  enhancing lesions are present throughout the skull, progressive from 2010 (for example 3 cm left parietal lesion on image 28 of series 41). There is involvement of the clivus. Prior bilateral cataract extraction is noted. There is a trace left mastoid effusion. No significant paranasal sinus inflammatory disease. Major intracranial vascular flow voids are preserved. MR CERVICAL SPINE FINDINGS: Cervical spine straightening is unchanged. There is no significant listhesis. There is progressive osseous metastatic disease throughout the cervical spine since the 2015 MRI. There is now complete marrow replacement involving the C4 vertebral body with patchy enhancement including a 1 cm focus posteriorly. There is also a left-sided posterior element involvement at C4 which is new. No epidural tumor or spinal cord compression is identified. No cervical spinal cord signal abnormality is identified within limitations of motion artifact. Diffuse cervical disc degeneration is again seen. There is mild neural foraminal stenosis on the right at C3-4, on the left at C4-5, and likely bilaterally at C5-6 and C6-7. This is overall similar to the prior MRI. No significant spinal stenosis is seen. The paraspinal soft tissues are unremarkable. MR THORACIC SPINE FINDINGS: Diffuse osseous metastatic disease is again seen throughout the vertebral bodies and posterior elements of the thoracic spine. There has overall been mild progression from the 07/05/2015 thoracic spine MRI. There is now near complete involvement of the T10 vertebral body. There is mildly progressive marrow replacement involving multiple mid thoracic vertebral bodies as well as T1 and T2. The T3 vertebral body remains completely involved. There is a mild T12 superior endplate compression fracture which demonstrates slightly progressive height loss compared to the prior study. There is slightly prominent ventral epidural enhancement in the mid thoracic spine from T5 -T8,  most conspicuous and mildly nodular in a left paracentral location at the T8 superior endplate level (series 37, image 33). This may reflect very small volume epidural tumor, prominent epidural venous plexus, or a combination of both. This has likely not significantly changed from the prior thoracic spine MRI, although there is much more motion artifact on axial sequences on that study. The spinal cord is normal in caliber and signal. There is no spinal cord compression. Multilevel disc degeneration is again seen with disc bulging and small disc protrusions without significant spinal canal or neural foraminal stenosis. Small left adrenal nodule and mild left upper pole renal caliectasis or cysts are partially visualized, grossly similar to the 09/07/2014 PET-CT. MR LUMBAR SPINE FINDINGS: Vertebral alignment is unchanged, with trace anterolisthesis again seen of L3 on L4. Osseous lesions throughout the lumbar spine do not appear significantly changed from the prior lumbar spine MRI, with the largest lesion in the L1 vertebral body. S3 level sacral osseous metastatic disease is partially visualized and grossly similar to the prior study. 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 Lumbar vertebral  body heights are preserved. There is diffuse lumbar disc desiccation. Severe disc space height loss at L4-5 is unchanged. Multilevel disc and facet degeneration does not appear significantly changed, with mild right foraminal stenosis again seen at L3-4. There is no spinal stenosis. No epidural tumor is identified. Conus medullaris is normal in signal and terminates at L1. IMPRESSION: 1. No acute intracranial abnormality. No evidence of brain metastases. 2. Numerous osseous skull metastases, progressed from 2010. 3. Widespread spinal osseous metastatic disease, with interval progression in the cervical and thoracic spine. No sizable epidural tumor identified. Slightly prominent ventral epidural enhancement in the  mid thoracic spine is favored to mostly reflect epidural venous plexus, however trace epidural tumor is possible particularly at T8. 4. No spinal stenosis. 5. Mild T12 superior endplate compression fracture with slightly progressive height loss from 07/05/2015. Electronically Signed   By: Logan Bores M.D.   On: 08/25/2015 17:08   Nm Pet Image Restag (ps) Skull Base To Thigh  08/31/2015  CLINICAL DATA:  Subsequent treatment strategy for metastatic breast carcinoma to bone. EXAM: NUCLEAR MEDICINE PET SKULL BASE TO THIGH TECHNIQUE: 6.3 mCi F-18 FDG was injected intravenously. Full-ring PET imaging was performed from the skull base to thigh after the radiotracer. CT data was obtained and used for attenuation correction and anatomic localization. FASTING BLOOD GLUCOSE:  Value: 103 mg/dl COMPARISON:  09/07/2014 FINDINGS: NECK No hypermetabolic lymph nodes in the neck. CHEST No hypermetabolic mediastinal or hilar nodes. No suspicious pulmonary nodules on the CT scan. Biapical pleural- parenchymal scarring appears stable. Previously seen area of hypermetabolic airspace disease in the left upper lobe has nearly completely resolved, consistent with resolving infectious or inflammatory process. ABDOMEN/PELVIS No abnormal hypermetabolic activity within the liver, pancreas, adrenal glands, or spleen. No hypermetabolic lymph nodes in the abdomen or pelvis. Tiny left hepatic lobe cyst remains stable. Prior cholecystectomy noted. 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 11 mm left adrenal nodule remains stable and shows no metabolic activity, consistent with benign adrenal adenoma. Colonic diverticulosis noted, without evidence of diverticulitis. SKELETON Mixed lytic and sclerotic bone metastases again seen within spine, pelvis, sternum, and proximal left femur. These appear stable and show no significant change in low-grade metabolic activity, consistent with largely treated osseous metastatic disease. IMPRESSION: Near complete  resolution of hypermetabolic airspace disease in left upper lobe, consistent with resolving infectious or inflammatory process. No significant change in appearance of largely treated osseous metastatic disease. No new or progressive metastatic disease identified. Electronically Signed   By: Earle Gell M.D.   On: 08/31/2015 12:48   Dg Chest Port 1 View  09/15/2015  CLINICAL DATA:  Short breath, cough, fever EXAM: PORTABLE CHEST 1 VIEW COMPARISON:  08/31/2015 FINDINGS: Normal cardiac silhouette with ectatic aorta. Increased density in the LEFT upper lobe. The biapical densities are not changed from CT. Small LEFT effusion. No pneumothorax. IMPRESSION: 1. Increasing density in the LEFT upper lobe. Concerning for edema or infection. Electronically Signed   By: Suzy Bouchard M.D.   On: 09/15/2015 07:26   US Abdomen Limited Ruq  09/15/2015  CLINICAL DATA:  Elevated liver function tests. EXAM: US ABDOMEN LIMITED - RIGHT UPPER QUADRANT COMPARISON:  08/31/2015, PET-CT. FINDINGS: Gallbladder: Surgically absent Common bile duct: Diameter: Dilated to a maximum of 17 mm, unchanged from the recent prior PET-CT. No sonographic evidence of a duct stone. Liver: Cyst in the left liver lobe measuring 13 somewhat mottled appearance of the liver in its more posterior aspect, which could be technique. The possibility of  numerous small infiltrating lesion should be considered in the patient's clinical setting. Liver is normal in size and overall echogenicity. IMPRESSION: 1. Chronically dilated common bile duct. Status post cholecystectomy. 2. Mottled appearance of the posterior liver. Consider followup liver MRI with and without contrast. Electronically Signed   By: Lajean Manes M.D.   On: 09/15/2015 10:04     LOS: 7 days   Oren Binet, MD  [facility] Pager:[phone]  If 7PM-7AM, please contact night-coverage [email or web address] Password [facility] 09/21/2015, 10:58 AM

## 2015-09-22 ENCOUNTER — Other Ambulatory Visit: Payer: Self-pay | Admitting: Oncology

## 2015-09-22 ENCOUNTER — Encounter (HOSPITAL_COMMUNITY): Payer: Self-pay | Admitting: Cardiovascular Disease

## 2015-09-22 DIAGNOSIS — C7951 Secondary malignant neoplasm of bone: Principal | ICD-10-CM

## 2015-09-22 DIAGNOSIS — C50912 Malignant neoplasm of unspecified site of left female breast: Secondary | ICD-10-CM

## 2015-09-22 DIAGNOSIS — R7881 Bacteremia: Secondary | ICD-10-CM

## 2015-09-22 DIAGNOSIS — C50919 Malignant neoplasm of unspecified site of unspecified female breast: Secondary | ICD-10-CM

## 2015-09-22 DIAGNOSIS — C50911 Malignant neoplasm of unspecified site of right female breast: Secondary | ICD-10-CM

## 2015-09-22 DIAGNOSIS — C50811 Malignant neoplasm of overlapping sites of right female breast: Secondary | ICD-10-CM

## 2015-09-22 LAB — PROTIME-INR
INR: 2.11 — AB (ref 0.00–1.49)
PROTHROMBIN TIME: 23.5 s — AB (ref 11.6–15.2)

## 2015-09-22 MED ORDER — MORPHINE SULFATE 15 MG PO TABS: 15.0000 mg | ORAL_TABLET | ORAL | Status: DC | PRN

## 2015-09-22 MED ORDER — BOOST / RESOURCE BREEZE PO LIQD: 1.0000 | Freq: Two times a day (BID) | ORAL | Status: DC

## 2015-09-22 MED ORDER — SODIUM CHLORIDE 0.9% FLUSH
10.0000 mL | Freq: Two times a day (BID) | INTRAVENOUS | Status: DC
Start: 2015-09-22 — End: 2015-09-22

## 2015-09-22 MED ORDER — SODIUM CHLORIDE 0.9% FLUSH: 10.0000 mL | INTRAVENOUS | Status: DC | PRN

## 2015-09-22 MED ORDER — PANTOPRAZOLE SODIUM 40 MG PO TBEC: 40.0000 mg | DELAYED_RELEASE_TABLET | Freq: Every day | ORAL | Status: AC

## 2015-09-22 MED ORDER — VANCOMYCIN HCL IN DEXTROSE 750-5 MG/150ML-% IV SOLN: 750.0000 mg | Freq: Two times a day (BID) | INTRAVENOUS | Status: DC

## 2015-09-22 MED ORDER — WARFARIN SODIUM 2 MG PO TABS
2.0000 mg | ORAL_TABLET | Freq: Once | ORAL | Status: DC
  Filled 2015-09-22: qty 1

## 2015-09-22 MED ORDER — LIDOCAINE 5 % EX PTCH: 1.0000 | MEDICATED_PATCH | CUTANEOUS | Status: AC

## 2015-09-22 MED ORDER — FENTANYL 50 MCG/HR TD PT72: 50.0000 ug | MEDICATED_PATCH | TRANSDERMAL | Status: DC

## 2015-09-22 MED ORDER — DEXAMETHASONE 2 MG PO TABS: 2.0000 mg | ORAL_TABLET | Freq: Two times a day (BID) | ORAL | Status: AC

## 2015-09-22 MED ORDER — MAGIC MOUTHWASH W/LIDOCAINE: 10.0000 mL | Freq: Three times a day (TID) | ORAL | Status: AC | PRN

## 2015-09-22 MED ORDER — ONDANSETRON 8 MG PO TBDP: ORAL_TABLET | ORAL | Status: AC

## 2015-09-22 MED ORDER — POLYETHYLENE GLYCOL 3350 17 G PO PACK: 17.0000 g | PACK | Freq: Every day | ORAL | Status: DC

## 2015-09-22 NOTE — Progress Notes (Signed)
ANTICOAGULATION CONSULT NOTE - Follow-Up  Pharmacy Consult for Warfarin Indication: hx extensive superficial vein thrombosis  Allergies  Allergen Reactions  . Aspirin Nausea And Vomiting  . Boost Pudding [Nutritional Supplements] Diarrhea  . Percodan [Oxycodone-Aspirin] Nausea Only  . Codeine Nausea Only   Patient Measurements: Height: 5\' 2"  (157.5 cm) Weight: 131 lb 8 oz (59.648 kg) IBW/kg (Calculated) : 50.1  Vital Signs: Temp: 97.8 F (36.6 C) (05/26 0434) Temp Source: Oral (05/26 0434) BP: 139/73 mmHg (05/26 0434) Pulse Rate: 73 (05/26 0434)  Labs:  Recent Labs  09/20/15 0539 09/21/15 0505 09/22/15 0507  LABPROT 23.5* 22.9* 23.5*  INR 2.11* 2.04* 2.11*  CREATININE  --  0.50  --     Estimated Creatinine Clearance: 51 mL/min (by C-G formula based on Cr of 0.5).  Medications:  Scheduled:  . dexamethasone  2 mg Oral Q12H  . docusate sodium  100 mg Oral Daily  . feeding supplement  1 Container Oral BID BM  . fentaNYL  50 mcg Transdermal Q72H  . lidocaine  1 patch Transdermal Q24H  . pantoprazole  40 mg Oral Daily  . polyethylene glycol  17 g Oral Daily  . vancomycin  750 mg Intravenous Q12H  . Warfarin - Pharmacist Dosing Inpatient   Does not apply q1800   Assessment: 89 yoF with hx of mets breast cancer on letrozole+ palbocicib, denosumab with plan for PAC placement on 5/18 for chemo to start in June. She was also on warfarin PTA for hx extensive vein thrombosis. Outpatient records patient was supposed to hold her warfarin three days prior to [facility] procedure.  However,  her INR was supratherapeutic at 9 on 5/10 and patient was instructed to hold her warfarin at that time.  Patient has not gotten PAC yet. She presented to the ED on 5/18 with c/o fever, chills, cough and worsening weakness. INR was 1.90 on admission  Home warfarin regimen: 2 mg daily except 4 mg on FSS   Today, 09/22/2015: - INR this AM is therapeutic @ 2.11, up from 2.04 on 5/25. - Most recent  dose is 2 mg on 5/25, and 2 mg on 5/24.  - CBC: Hgb stable, pltc improved (112->148) on 5/21  - No bleeding documented - Drug-drug intxns: None significant - Cardiac diet, thin liquid  Goal of Therapy:  INR 2-3 Monitor platelets by anticoagulation protocol: Yes   Plan:  - Give warfarin 2 mg x 1 today to maintain pt in therapeutic range. - Daily INR/PTT - Monitor for s/s bleeding  Myrna Blazer, PharmD Candidate  09/22/2015 7:21 AM

## 2015-09-22 NOTE — Progress Notes (Unsigned)
.   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 She is can return to see [place] June 5 for labs and her first [facility] chemotherapy. Her cousin from [place] will be staying with her after she is discharged from [place]  I am adding a chest x-ray to her June 6studiesjust to make sure we continue to move in the right direction as far as her MRSA pneumonia is concerned

## 2015-09-22 NOTE — Progress Notes (Signed)
Called report to [place] at [facility]. Answered all questions. Patient will be transferred to facility via [place].

## 2015-09-22 NOTE — Discharge Summary (Signed)
PATIENT DETAILS Name: [name] Age: 70 y.o. Sex: female Date of Birth: 1946-03-03 MRN: [id number]. Admitting Physician: Jonetta Osgood, MD [id number] E, MD  Admit Date: 09/14/2015 Discharge date: 09/22/2015  Recommendations for Outpatient Follow-up:  1. 4 weeks of vancomycin from 09/19/15 2. Weekly CBC,CMET and vancomycin trough levels-please fax results to infectious disease clinic at [phone] 3. Ensure follow-up with oncology (Dr [name]) and radiation oncology (Dr Sondra Come). 4. Please ensure follow-up with infectious disease clinic before discontinuing IV antibiotics. 5. Please discontinue PICC line once IV antibiotics have been completed 6. On Coumadin-please check PT with INR once or twice a week.  PRIMARY DISCHARGE DIAGNOSIS:  Principal Problem:   HCAP (healthcare-associated pneumonia) Active Problems:   Breast cancer metastasized to bone ([place])   Anorexia   Essential hypertension   Protein-calorie malnutrition, severe (HCC)   SIRS (systemic inflammatory response syndrome) (HCC)   Encounter for palliative care   Generalized abdominal pain   Elevated LFTs   MRSA bacteremia   Staphylococcus aureus bacteremia with sepsis ([place])   Neoplasm related pain   Rib pain on left side   Bacteremia      PAST MEDICAL HISTORY: Past Medical History  Diagnosis Date  . Cancer (HCC)     RBreast, Lower back , Under l Arm  . Hypertension   . GERD (gastroesophageal reflux disease)   . Breast cancer metastasized to bone ([place]) 04/30/2011  . Radiation 01/15/2012    Lumbosacral spine 3500 cGy 14  fx  . Swollen R ankle 08/10/2012  . Fracture of ankle, medial malleolus, right, closed 08/10/2012  . Tubular adenoma 2011  . Hiatal hernia 2011  . Schatzki's ring   . Anorexia 03/02/2013  . Weight loss due to medication 03/02/2013  . C. difficile colitis   . Pneumonia   . 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 Qty: 5 patch, Refills: 0    lidocaine (LIDODERM) 5 % Place 1 patch onto the skin daily. Remove & Discard patch within 12 hours or as directed by MD Qty: 30 patch, Refills: 0    magic mouthwash w/lidocaine SOLN Take 10 mLs by mouth 3 (three) times daily as needed for mouth pain. Refills: 0    pantoprazole (PROTONIX) 40 MG tablet Take 1 tablet (40 mg total) by mouth daily.    polyethylene glycol (MIRALAX / GLYCOLAX) packet Take 17 g by mouth daily. Qty: 14 each, Refills: 0   Associated Diagnoses: Breast cancer metastasized to bone, unspecified laterality (HCC)    Vancomycin (VANCOCIN) 750-5 MG/150ML-% SOLN Inject 150 mLs (750 mg total) into the vein every 12 (twelve) hours. FOR 4 WEEKS FROM 09/19/15      CONTINUE these medications which have CHANGED   Details  dexamethasone (DECADRON) 2 MG tablet Take 1 tablet (2 mg total) by mouth every 12 (twelve) hours.   Associated Diagnoses: Breast cancer metastasized to bone, unspecified laterality ([place])    feeding supplement (BOOST / RESOURCE BREEZE) LIQD Take 1 Container by mouth 2 (two) times daily between meals. Refills: 0    morphine (MSIR) 15 MG tablet Take 1 tablet (15 mg total) by mouth every 4 (four) hours as needed for severe pain. Once every 12 hours as needed Qty: 30 tablet, Refills: 0  ondansetron (ZOFRAN-ODT) 8 MG disintegrating tablet DISSOLVE 1 TABLET ON THE TONGUE EVERY 8 HOURS AS NEEDED FOR NAUSEA   Associated Diagnoses: Malignant neoplasm involving both nipple and areola of right breast in female ([place])      CONTINUE these medications which have NOT CHANGED   Details  Polyethyl Glycol-Propyl Glycol (SYSTANE OP) Apply 1 drop to eye daily as needed (dry eyes).    warfarin (COUMADIN) 4 MG tablet Take 1 tablet (4 mg total) by mouth daily. Qty: 90 tablet, Refills: 4      STOP  taking these medications     carvedilol (COREG) 3.125 MG tablet      HYDROcodone-acetaminophen (NORCO) 10-325 MG tablet      hyoscyamine (ANASPAZ) 0.125 MG TBDP disintergrating tablet      loperamide (IMODIUM A-D) 2 MG tablet      mometasone (ELOCON) 0.1 % lotion         ALLERGIES:   Allergies  Allergen Reactions  . Aspirin Nausea And Vomiting  . Boost Pudding [Nutritional Supplements] Diarrhea  . Percodan [Oxycodone-Aspirin] Nausea Only  . Codeine Nausea Only    BRIEF HPI:  See H&P, Labs, Consult and Test reports for all details in brief, Patient is a 70 y.o. female with history of metastatic breast cancer, admitted with healthcare associated pneumonia, generalized weakness and generalized bony pain.She initially presented to [facility] in [place], and then was transferred to [facility]. Blood cultures done at [facility] on 5/18 are positive for MRSA  CONSULTATIONS:   ID, hematology/oncology and Palliative  PERTINENT RADIOLOGIC STUDIES: Ct Chest Wo Contrast  09/20/2015  CLINICAL DATA:  Pneumonia. Left-sided chest pain. Personal history of breast cancer with bone metastases. EXAM: CT CHEST WITHOUT CONTRAST TECHNIQUE: Multidetector CT imaging of the chest was performed following the standard protocol without IV contrast. COMPARISON:  Chest radiography 09/15/2015.  Chest CT 09/11/2015. FINDINGS: Again demonstrated is benign appearing pleural and parenchymal scarring at both lung apices. There is a background pattern of centrilobular emphysema particularly evident in the upper lobes. Patient has developed multiple widely disseminated cavitary infiltrates. There are 2 adjacent regions of cavitary infiltration in the left upper lobe responsible for the chest radiographic finding. The larger area measures 3.6 cm in diameter. The smaller area measures 2.8 cm in diameter. There is no pleural or pericardial fluid. No enlarged hilar or mediastinal lymph nodes are  identified. Widespread bony metastatic changes are present as seen previously. Scans in the upper abdomen do not show any acute finding. A region of hazy infiltrate in the right lower lobe with 2 areas of cavitary infiltration is newly developed. The larger region measures 3.5 cm in diameter in the smaller region measures 1.4 cm in diameter. IMPRESSION: Development of multiple areas of cavitary infiltration in both the right and left lung, which have developed rapidly since a CT study of 9 days ago at which time day were not present at all. Lesions developing this rapidly must be inflammatory and could represent spontaneous pulmonary abscesses or could represent abscesses secondary to septic emboli. Certainly, atypical organisms must be considered including fungal disease. Electronically Signed   By: Nelson Chimes M.D.   On: 09/20/2015 20:41   Mr Jeri Cos [id number] Contrast  08/25/2015  CLINICAL DATA:  Ongoing headache. Arm pain and pain beneath the sternum and ribs for 2 weeks. Right leg pain. Metastatic breast cancer. EXAM: MRI HEAD WITHOUT AND WITH CONTRAST; MRI CERVICAL SPINE WITHOUT AND WITH CONTRAST; MRI LUMBAR SPINE WITHOUT AND WITH  CONTRAST; MRI THORACIC SPINE WITHOUT AND WITH CONTRAST TECHNIQUE: Multiplanar, multiecho pulse sequences of the brain and surrounding structures were obtained according to standard protocol without and with intravenous contrast; Multiplanar and multiecho pulse sequences of the cervical spine, to include the craniocervical junction and cervicothoracic junction, were obtained according to standard protocol without and with intravenous contrast.; Multiplanar and multiecho pulse sequences of the lumbar spine were obtained without and with intravenous contrast.; Multiplanar and multiecho pulse sequences of the thoracic spine were obtained without and with intravenous contrast. CONTRAST:  57mL MULTIHANCE GADOBENATE DIMEGLUMINE 529 MG/ML IV SOLN COMPARISON:  Head CT 08/05/2015 and MRI  02/14/2009. Cervical spine MRI 05/14/2013. Thoracic and lumbar spine MRI 07/05/2015. FINDINGS: MR HEAD FINDINGS: There is no evidence of acute infarct, intracranial hemorrhage, mass, midline shift, or extra-axial fluid collection. There is mild cerebral atrophy. Small foci of T2 hyperintensity in the cerebral white matter bilaterally, stable to slightly increased from 2010 and nonspecific but compatible with mild-to-moderate chronic small vessel ischemic disease. No enhancing brain lesions are identified. There is at most minimally prominent dural enhancement without nodularity. Small enhancing lesions are present throughout the skull, progressive from 2010 (for example 3 cm left parietal lesion on image 28 of series 41). There is involvement of the clivus. Prior bilateral cataract extraction is noted. There is a trace left mastoid effusion. No significant paranasal sinus inflammatory disease. Major intracranial vascular flow voids are preserved. MR CERVICAL SPINE FINDINGS: Cervical spine straightening is unchanged. There is no significant listhesis. There is progressive osseous metastatic disease throughout the cervical spine since the 2015 MRI. There is now complete marrow replacement involving the C4 vertebral body with patchy enhancement including a 1 cm focus posteriorly. There is also a left-sided posterior element involvement at C4 which is new. No epidural tumor or spinal cord compression is identified. No cervical spinal cord signal abnormality is identified within limitations of motion artifact. Diffuse cervical disc degeneration is again seen. There is mild neural foraminal stenosis on the right at C3-4, on the left at C4-5, and likely bilaterally at C5-6 and C6-7. This is overall similar to the prior MRI. No significant spinal stenosis is seen. The paraspinal soft tissues are unremarkable. MR THORACIC SPINE FINDINGS: Diffuse osseous metastatic disease is again seen throughout the vertebral bodies and  posterior elements of the thoracic spine. There has overall been mild progression from the 07/05/2015 thoracic spine MRI. There is now near complete involvement of the T10 vertebral body. There is mildly progressive marrow replacement involving multiple mid thoracic vertebral bodies as well as T1 and T2. The T3 vertebral body remains completely involved. There is a mild T12 superior endplate compression fracture which demonstrates slightly progressive height loss compared to the prior study. There is slightly prominent ventral epidural enhancement in the mid thoracic spine from T5 -T8, most conspicuous and mildly nodular in a left paracentral location at the T8 superior endplate level (series 37, image 33). This may reflect very small volume epidural tumor, prominent epidural venous plexus, or a combination of both. This has likely not significantly changed from the prior thoracic spine MRI, although there is much more motion artifact on axial sequences on that study. The spinal cord is normal in caliber and signal. There is no spinal cord compression. Multilevel disc degeneration is again seen with disc bulging and small disc protrusions without significant spinal canal or neural foraminal stenosis. Small left adrenal nodule and mild left upper pole renal caliectasis or cysts are partially visualized, grossly similar to  the 09/07/2014 PET-CT. MR LUMBAR SPINE FINDINGS: Vertebral alignment is unchanged, with trace anterolisthesis again seen of L3 on L4. Osseous lesions throughout the lumbar spine do not appear significantly changed from the prior lumbar spine MRI, with the largest lesion in the L1 vertebral body. S3 level sacral osseous metastatic disease is partially visualized and grossly similar to the prior study. 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 Lumbar vertebral body heights are preserved. There is diffuse lumbar disc desiccation. Severe disc space height loss at L4-5 is unchanged. Multilevel  disc and facet degeneration does not appear significantly changed, with mild right foraminal stenosis again seen at L3-4. There is no spinal stenosis. No epidural tumor is identified. Conus medullaris is normal in signal and terminates at L1. IMPRESSION: 1. No acute intracranial abnormality. No evidence of brain metastases. 2. Numerous osseous skull metastases, progressed from 2010. 3. Widespread spinal osseous metastatic disease, with interval progression in the cervical and thoracic spine. No sizable epidural tumor identified. Slightly prominent ventral epidural enhancement in the mid thoracic spine is favored to mostly reflect epidural venous plexus, however trace epidural tumor is possible particularly at T8. 4. No spinal stenosis. 5. Mild T12 superior endplate compression fracture with slightly progressive height loss from 07/05/2015. Electronically Signed   By: Logan Bores M.D.   On: 08/25/2015 17:08   Mr Cervical Spine W Wo Contrast  08/25/2015  CLINICAL DATA:  Ongoing headache. Arm pain and pain beneath the sternum and ribs for 2 weeks. Right leg pain. Metastatic breast cancer. EXAM: MRI HEAD WITHOUT AND WITH CONTRAST; MRI CERVICAL SPINE WITHOUT AND WITH CONTRAST; MRI LUMBAR SPINE WITHOUT AND WITH CONTRAST; MRI THORACIC SPINE WITHOUT AND WITH CONTRAST TECHNIQUE: Multiplanar, multiecho pulse sequences of the brain and surrounding structures were obtained according to standard protocol without and with intravenous contrast; Multiplanar and multiecho pulse sequences of the cervical spine, to include the craniocervical junction and cervicothoracic junction, were obtained according to standard protocol without and with intravenous contrast.; Multiplanar and multiecho pulse sequences of the lumbar spine were obtained without and with intravenous contrast.; Multiplanar and multiecho pulse sequences of the thoracic spine were obtained without and with intravenous contrast. CONTRAST:  85mL MULTIHANCE GADOBENATE  DIMEGLUMINE 529 MG/ML IV SOLN COMPARISON:  Head CT 08/05/2015 and MRI 02/14/2009. Cervical spine MRI 05/14/2013. Thoracic and lumbar spine MRI 07/05/2015. FINDINGS: MR HEAD FINDINGS: There is no evidence of acute infarct, intracranial hemorrhage, mass, midline shift, or extra-axial fluid collection. There is mild cerebral atrophy. Small foci of T2 hyperintensity in the cerebral white matter bilaterally, stable to slightly increased from 2010 and nonspecific but compatible with mild-to-moderate chronic small vessel ischemic disease. No enhancing brain lesions are identified. There is at most minimally prominent dural enhancement without nodularity. Small enhancing lesions are present throughout the skull, progressive from 2010 (for example 3 cm left parietal lesion on image 28 of series 41). There is involvement of the clivus. Prior bilateral cataract extraction is noted. There is a trace left mastoid effusion. No significant paranasal sinus inflammatory disease. Major intracranial vascular flow voids are preserved. MR CERVICAL SPINE FINDINGS: Cervical spine straightening is unchanged. There is no significant listhesis. There is progressive osseous metastatic disease throughout the cervical spine since the 2015 MRI. There is now complete marrow replacement involving the C4 vertebral body with patchy enhancement including a 1 cm focus posteriorly. There is also a left-sided posterior element involvement at C4 which is new. No epidural tumor or spinal cord compression is identified. No cervical spinal cord  signal abnormality is identified within limitations of motion artifact. Diffuse cervical disc degeneration is again seen. There is mild neural foraminal stenosis on the right at C3-4, on the left at C4-5, and likely bilaterally at C5-6 and C6-7. This is overall similar to the prior MRI. No significant spinal stenosis is seen. The paraspinal soft tissues are unremarkable. MR THORACIC SPINE FINDINGS: Diffuse osseous  metastatic disease is again seen throughout the vertebral bodies and posterior elements of the thoracic spine. There has overall been mild progression from the 07/05/2015 thoracic spine MRI. There is now near complete involvement of the T10 vertebral body. There is mildly progressive marrow replacement involving multiple mid thoracic vertebral bodies as well as T1 and T2. The T3 vertebral body remains completely involved. There is a mild T12 superior endplate compression fracture which demonstrates slightly progressive height loss compared to the prior study. There is slightly prominent ventral epidural enhancement in the mid thoracic spine from T5 -T8, most conspicuous and mildly nodular in a left paracentral location at the T8 superior endplate level (series 37, image 33). This may reflect very small volume epidural tumor, prominent epidural venous plexus, or a combination of both. This has likely not significantly changed from the prior thoracic spine MRI, although there is much more motion artifact on axial sequences on that study. The spinal cord is normal in caliber and signal. There is no spinal cord compression. Multilevel disc degeneration is again seen with disc bulging and small disc protrusions without significant spinal canal or neural foraminal stenosis. Small left adrenal nodule and mild left upper pole renal caliectasis or cysts are partially visualized, grossly similar to the 09/07/2014 PET-CT. MR LUMBAR SPINE FINDINGS: Vertebral alignment is unchanged, with trace anterolisthesis again seen of L3 on L4. Osseous lesions throughout the lumbar spine do not appear significantly changed from the prior lumbar spine MRI, with the largest lesion in the L1 vertebral body. S3 level sacral osseous metastatic disease is partially visualized and grossly similar to the prior study. 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 Lumbar vertebral body heights are preserved. There is diffuse lumbar disc  desiccation. Severe disc space height loss at L4-5 is unchanged. Multilevel disc and facet degeneration does not appear significantly changed, with mild right foraminal stenosis again seen at L3-4. There is no spinal stenosis. No epidural tumor is identified. Conus medullaris is normal in signal and terminates at L1. IMPRESSION: 1. No acute intracranial abnormality. No evidence of brain metastases. 2. Numerous osseous skull metastases, progressed from 2010. 3. Widespread spinal osseous metastatic disease, with interval progression in the cervical and thoracic spine. No sizable epidural tumor identified. Slightly prominent ventral epidural enhancement in the mid thoracic spine is favored to mostly reflect epidural venous plexus, however trace epidural tumor is possible particularly at T8. 4. No spinal stenosis. 5. Mild T12 superior endplate compression fracture with slightly progressive height loss from 07/05/2015. Electronically Signed   By: Logan Bores M.D.   On: 08/25/2015 17:08   Mr Thoracic Spine W Wo Contrast  08/25/2015  CLINICAL DATA:  Ongoing headache. Arm pain and pain beneath the sternum and ribs for 2 weeks. Right leg pain. Metastatic breast cancer. EXAM: MRI HEAD WITHOUT AND WITH CONTRAST; MRI CERVICAL SPINE WITHOUT AND WITH CONTRAST; MRI LUMBAR SPINE WITHOUT AND WITH CONTRAST; MRI THORACIC SPINE WITHOUT AND WITH CONTRAST TECHNIQUE: Multiplanar, multiecho pulse sequences of the brain and surrounding structures were obtained according to standard protocol without and with intravenous contrast; Multiplanar and multiecho pulse sequences of  the cervical spine, to include the craniocervical junction and cervicothoracic junction, were obtained according to standard protocol without and with intravenous contrast.; Multiplanar and multiecho pulse sequences of the lumbar spine were obtained without and with intravenous contrast.; Multiplanar and multiecho pulse sequences of the thoracic spine were obtained  without and with intravenous contrast. CONTRAST:  5mL MULTIHANCE GADOBENATE DIMEGLUMINE 529 MG/ML IV SOLN COMPARISON:  Head CT 08/05/2015 and MRI 02/14/2009. Cervical spine MRI 05/14/2013. Thoracic and lumbar spine MRI 07/05/2015. FINDINGS: MR HEAD FINDINGS: There is no evidence of acute infarct, intracranial hemorrhage, mass, midline shift, or extra-axial fluid collection. There is mild cerebral atrophy. Small foci of T2 hyperintensity in the cerebral white matter bilaterally, stable to slightly increased from 2010 and nonspecific but compatible with mild-to-moderate chronic small vessel ischemic disease. No enhancing brain lesions are identified. There is at most minimally prominent dural enhancement without nodularity. Small enhancing lesions are present throughout the skull, progressive from 2010 (for example 3 cm left parietal lesion on image 28 of series 41). There is involvement of the clivus. Prior bilateral cataract extraction is noted. There is a trace left mastoid effusion. No significant paranasal sinus inflammatory disease. Major intracranial vascular flow voids are preserved. MR CERVICAL SPINE FINDINGS: Cervical spine straightening is unchanged. There is no significant listhesis. There is progressive osseous metastatic disease throughout the cervical spine since the 2015 MRI. There is now complete marrow replacement involving the C4 vertebral body with patchy enhancement including a 1 cm focus posteriorly. There is also a left-sided posterior element involvement at C4 which is new. No epidural tumor or spinal cord compression is identified. No cervical spinal cord signal abnormality is identified within limitations of motion artifact. Diffuse cervical disc degeneration is again seen. There is mild neural foraminal stenosis on the right at C3-4, on the left at C4-5, and likely bilaterally at C5-6 and C6-7. This is overall similar to the prior MRI. No significant spinal stenosis is seen. The paraspinal  soft tissues are unremarkable. MR THORACIC SPINE FINDINGS: Diffuse osseous metastatic disease is again seen throughout the vertebral bodies and posterior elements of the thoracic spine. There has overall been mild progression from the 07/05/2015 thoracic spine MRI. There is now near complete involvement of the T10 vertebral body. There is mildly progressive marrow replacement involving multiple mid thoracic vertebral bodies as well as T1 and T2. The T3 vertebral body remains completely involved. There is a mild T12 superior endplate compression fracture which demonstrates slightly progressive height loss compared to the prior study. There is slightly prominent ventral epidural enhancement in the mid thoracic spine from T5 -T8, most conspicuous and mildly nodular in a left paracentral location at the T8 superior endplate level (series 37, image 33). This may reflect very small volume epidural tumor, prominent epidural venous plexus, or a combination of both. This has likely not significantly changed from the prior thoracic spine MRI, although there is much more motion artifact on axial sequences on that study. The spinal cord is normal in caliber and signal. There is no spinal cord compression. Multilevel disc degeneration is again seen with disc bulging and small disc protrusions without significant spinal canal or neural foraminal stenosis. Small left adrenal nodule and mild left upper pole renal caliectasis or cysts are partially visualized, grossly similar to the 09/07/2014 PET-CT. MR LUMBAR SPINE FINDINGS: Vertebral alignment is unchanged, with trace anterolisthesis again seen of L3 on L4. Osseous lesions throughout the lumbar spine do not appear significantly changed from the prior lumbar spine  MRI, with the largest lesion in the L1 vertebral body. S3 level sacral osseous metastatic disease is partially visualized and grossly similar to the prior study. 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  Lumbar vertebral body heights are preserved. There is diffuse lumbar disc desiccation. Severe disc space height loss at L4-5 is unchanged. Multilevel disc and facet degeneration does not appear significantly changed, with mild right foraminal stenosis again seen at L3-4. There is no spinal stenosis. No epidural tumor is identified. Conus medullaris is normal in signal and terminates at L1. IMPRESSION: 1. No acute intracranial abnormality. No evidence of brain metastases. 2. Numerous osseous skull metastases, progressed from 2010. 3. Widespread spinal osseous metastatic disease, with interval progression in the cervical and thoracic spine. No sizable epidural tumor identified. Slightly prominent ventral epidural enhancement in the mid thoracic spine is favored to mostly reflect epidural venous plexus, however trace epidural tumor is possible particularly at T8. 4. No spinal stenosis. 5. Mild T12 superior endplate compression fracture with slightly progressive height loss from 07/05/2015. Electronically Signed   By: Logan Bores M.D.   On: 08/25/2015 17:08   Mr Lumbar Spine W Wo Contrast  08/25/2015  CLINICAL DATA:  Ongoing headache. Arm pain and pain beneath the sternum and ribs for 2 weeks. Right leg pain. Metastatic breast cancer. EXAM: MRI HEAD WITHOUT AND WITH CONTRAST; MRI CERVICAL SPINE WITHOUT AND WITH CONTRAST; MRI LUMBAR SPINE WITHOUT AND WITH CONTRAST; MRI THORACIC SPINE WITHOUT AND WITH CONTRAST TECHNIQUE: Multiplanar, multiecho pulse sequences of the brain and surrounding structures were obtained according to standard protocol without and with intravenous contrast; Multiplanar and multiecho pulse sequences of the cervical spine, to include the craniocervical junction and cervicothoracic junction, were obtained according to standard protocol without and with intravenous contrast.; Multiplanar and multiecho pulse sequences of the lumbar spine were obtained without and with intravenous contrast.;  Multiplanar and multiecho pulse sequences of the thoracic spine were obtained without and with intravenous contrast. CONTRAST:  28mL MULTIHANCE GADOBENATE DIMEGLUMINE 529 MG/ML IV SOLN COMPARISON:  Head CT 08/05/2015 and MRI 02/14/2009. Cervical spine MRI 05/14/2013. Thoracic and lumbar spine MRI 07/05/2015. FINDINGS: MR HEAD FINDINGS: There is no evidence of acute infarct, intracranial hemorrhage, mass, midline shift, or extra-axial fluid collection. There is mild cerebral atrophy. Small foci of T2 hyperintensity in the cerebral white matter bilaterally, stable to slightly increased from 2010 and nonspecific but compatible with mild-to-moderate chronic small vessel ischemic disease. No enhancing brain lesions are identified. There is at most minimally prominent dural enhancement without nodularity. Small enhancing lesions are present throughout the skull, progressive from 2010 (for example 3 cm left parietal lesion on image 28 of series 41). There is involvement of the clivus. Prior bilateral cataract extraction is noted. There is a trace left mastoid effusion. No significant paranasal sinus inflammatory disease. Major intracranial vascular flow voids are preserved. MR CERVICAL SPINE FINDINGS: Cervical spine straightening is unchanged. There is no significant listhesis. There is progressive osseous metastatic disease throughout the cervical spine since the 2015 MRI. There is now complete marrow replacement involving the C4 vertebral body with patchy enhancement including a 1 cm focus posteriorly. There is also a left-sided posterior element involvement at C4 which is new. No epidural tumor or spinal cord compression is identified. No cervical spinal cord signal abnormality is identified within limitations of motion artifact. Diffuse cervical disc degeneration is again seen. There is mild neural foraminal stenosis on the right at C3-4, on the left at C4-5, and likely bilaterally at  C5-6 and C6-7. This is overall  similar to the prior MRI. No significant spinal stenosis is seen. The paraspinal soft tissues are unremarkable. MR THORACIC SPINE FINDINGS: Diffuse osseous metastatic disease is again seen throughout the vertebral bodies and posterior elements of the thoracic spine. There has overall been mild progression from the 07/05/2015 thoracic spine MRI. There is now near complete involvement of the T10 vertebral body. There is mildly progressive marrow replacement involving multiple mid thoracic vertebral bodies as well as T1 and T2. The T3 vertebral body remains completely involved. There is a mild T12 superior endplate compression fracture which demonstrates slightly progressive height loss compared to the prior study. There is slightly prominent ventral epidural enhancement in the mid thoracic spine from T5 -T8, most conspicuous and mildly nodular in a left paracentral location at the T8 superior endplate level (series 37, image 33). This may reflect very small volume epidural tumor, prominent epidural venous plexus, or a combination of both. This has likely not significantly changed from the prior thoracic spine MRI, although there is much more motion artifact on axial sequences on that study. The spinal cord is normal in caliber and signal. There is no spinal cord compression. Multilevel disc degeneration is again seen with disc bulging and small disc protrusions without significant spinal canal or neural foraminal stenosis. Small left adrenal nodule and mild left upper pole renal caliectasis or cysts are partially visualized, grossly similar to the 09/07/2014 PET-CT. MR LUMBAR SPINE FINDINGS: Vertebral alignment is unchanged, with trace anterolisthesis again seen of L3 on L4. Osseous lesions throughout the lumbar spine do not appear significantly changed from the prior lumbar spine MRI, with the largest lesion in the L1 vertebral body. S3 level sacral osseous metastatic disease is partially visualized and grossly  similar to the prior study. 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 Lumbar vertebral body heights are preserved. There is diffuse lumbar disc desiccation. Severe disc space height loss at L4-5 is unchanged. Multilevel disc and facet degeneration does not appear significantly changed, with mild right foraminal stenosis again seen at L3-4. There is no spinal stenosis. No epidural tumor is identified. Conus medullaris is normal in signal and terminates at L1. IMPRESSION: 1. No acute intracranial abnormality. No evidence of brain metastases. 2. Numerous osseous skull metastases, progressed from 2010. 3. Widespread spinal osseous metastatic disease, with interval progression in the cervical and thoracic spine. No sizable epidural tumor identified. Slightly prominent ventral epidural enhancement in the mid thoracic spine is favored to mostly reflect epidural venous plexus, however trace epidural tumor is possible particularly at T8. 4. No spinal stenosis. 5. Mild T12 superior endplate compression fracture with slightly progressive height loss from 07/05/2015. Electronically Signed   By: Logan Bores M.D.   On: 08/25/2015 17:08   Nm Pet Image Restag (ps) Skull Base To Thigh  08/31/2015  CLINICAL DATA:  Subsequent treatment strategy for metastatic breast carcinoma to bone. EXAM: NUCLEAR MEDICINE PET SKULL BASE TO THIGH TECHNIQUE: 6.3 mCi F-18 FDG was injected intravenously. Full-ring PET imaging was performed from the skull base to thigh after the radiotracer. CT data was obtained and used for attenuation correction and anatomic localization. FASTING BLOOD GLUCOSE:  Value: 103 mg/dl COMPARISON:  09/07/2014 FINDINGS: NECK No hypermetabolic lymph nodes in the neck. CHEST No hypermetabolic mediastinal or hilar nodes. No suspicious pulmonary nodules on the CT scan. Biapical pleural- parenchymal scarring appears stable. Previously seen area of hypermetabolic airspace disease in the left upper lobe has nearly  completely resolved, consistent  with resolving infectious or inflammatory process. ABDOMEN/PELVIS No abnormal hypermetabolic activity within the liver, pancreas, adrenal glands, or spleen. No hypermetabolic lymph nodes in the abdomen or pelvis. Tiny left hepatic lobe cyst remains stable. Prior cholecystectomy noted. 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 11 mm left adrenal nodule remains stable and shows no metabolic activity, consistent with benign adrenal adenoma. Colonic diverticulosis noted, without evidence of diverticulitis. SKELETON Mixed lytic and sclerotic bone metastases again seen within spine, pelvis, sternum, and proximal left femur. These appear stable and show no significant change in low-grade metabolic activity, consistent with largely treated osseous metastatic disease. IMPRESSION: Near complete resolution of hypermetabolic airspace disease in left upper lobe, consistent with resolving infectious or inflammatory process. No significant change in appearance of largely treated osseous metastatic disease. No new or progressive metastatic disease identified. Electronically Signed   By: Earle Gell M.D.   On: 08/31/2015 12:48   Dg Chest Port 1 View  09/15/2015  CLINICAL DATA:  Short breath, cough, fever EXAM: PORTABLE CHEST 1 VIEW COMPARISON:  08/31/2015 FINDINGS: Normal cardiac silhouette with ectatic aorta. Increased density in the LEFT upper lobe. The biapical densities are not changed from CT. Small LEFT effusion. No pneumothorax. IMPRESSION: 1. Increasing density in the LEFT upper lobe. Concerning for edema or infection. Electronically Signed   By: Suzy Bouchard M.D.   On: 09/15/2015 07:26   US Abdomen Limited Ruq  09/15/2015  CLINICAL DATA:  Elevated liver function tests. EXAM: US ABDOMEN LIMITED - RIGHT UPPER QUADRANT COMPARISON:  08/31/2015, PET-CT. FINDINGS: Gallbladder: Surgically absent Common bile duct: Diameter: Dilated to a maximum of 17 mm, unchanged from the recent prior  PET-CT. No sonographic evidence of a duct stone. Liver: Cyst in the left liver lobe measuring 13 somewhat mottled appearance of the liver in its more posterior aspect, which could be technique. The possibility of numerous small infiltrating lesion should be considered in the patient's clinical setting. Liver is normal in size and overall echogenicity. IMPRESSION: 1. Chronically dilated common bile duct. Status post cholecystectomy. 2. Mottled appearance of the posterior liver. Consider followup liver MRI with and without contrast. Electronically Signed   By: Lajean Manes M.D.   On: 09/15/2015 10:04     PERTINENT LAB RESULTS: CBC: No results for input(s): WBC, HGB, HCT, PLT in the last 72 hours. CMET CMP     Component Value Date/Time   NA 138 09/21/2015 0505   NA 142 09/06/2015 1027   K 3.9 09/21/2015 0505   K 4.3 09/06/2015 1027   CL 101 09/21/2015 0505   CL 110* 09/02/2012 0814   CO2 30 09/21/2015 0505   CO2 28 09/06/2015 1027   GLUCOSE 126* 09/21/2015 0505   GLUCOSE 128 09/06/2015 1027   GLUCOSE 105* 09/02/2012 0814   BUN 23* 09/21/2015 0505   BUN 35.0* 09/06/2015 1027   CREATININE 0.50 09/21/2015 0505   CREATININE 0.9 09/06/2015 1027   CALCIUM 8.3* 09/21/2015 0505   CALCIUM 9.0 09/06/2015 1027   PROT 4.9* 09/16/2015 0532   PROT 6.4 09/06/2015 1027   ALBUMIN 1.9* 09/16/2015 0532   ALBUMIN 2.8* 09/06/2015 1027   AST 38 09/16/2015 0532   AST 16 09/06/2015 1027   ALT 54 09/16/2015 0532   ALT 23 09/06/2015 1027   ALKPHOS 142* 09/16/2015 0532   ALKPHOS 161* 09/06/2015 1027   BILITOT 0.8 09/16/2015 0532   BILITOT 0.54 09/06/2015 1027   GFRNONAA >60 09/21/2015 0505   GFRAA >60 09/21/2015 0505    GFR Estimated Creatinine Clearance:  51 mL/min (by C-G formula based on Cr of 0.5). No results for input(s): LIPASE, AMYLASE in the last 72 hours. No results for input(s): CKTOTAL, CKMB, CKMBINDEX, TROPONINI in the last 72 hours. Invalid input(s): POCBNP No results for input(s):  DDIMER in the last 72 hours. No results for input(s): HGBA1C in the last 72 hours. No results for input(s): CHOL, HDL, LDLCALC, TRIG, CHOLHDL, LDLDIRECT in the last 72 hours. No results for input(s): TSH, T4TOTAL, T3FREE, THYROIDAB in the last 72 hours.  Invalid input(s): FREET3 No results for input(s): VITAMINB12, FOLATE, FERRITIN, TIBC, IRON, RETICCTPCT in the last 72 hours. Coags:  Recent Labs  09/21/15 0505 09/22/15 0507  INR 2.04* 2.11*   Microbiology: Recent Results (from the past 240 hour(s))  Culture, blood (routine x 2)     Status: None   Collection Time: 09/14/15  6:35 PM  Result Value Ref Range Status   Specimen Description BLOOD RIGHT ARM  Final   Special Requests BOTTLES DRAWN AEROBIC AND ANAEROBIC [place]  Final   Culture   Final    NO GROWTH 5 DAYS Performed at [facility]    Report Status 09/19/2015 FINAL  Final  Culture, blood (routine x 2)     Status: None   Collection Time: 09/14/15  6:40 PM  Result Value Ref Range Status   Specimen Description BLOOD LEFT HAND  Final   Special Requests BOTTLES DRAWN AEROBIC AND ANAEROBIC 5CC  Final   Culture   Final    NO GROWTH 5 DAYS Performed at [facility]    Report Status 09/19/2015 FINAL  Final     BRIEF HOSPITAL COURSE:  SIR's secondary to HCAP (healthcare-associated pneumonia) with cavitary lesions and MRSA bacteremia:Clinically improved- afebrile. Blood cultures done at [facility] on 5/18 are positive for MRSA. Blood cultures done here are negative,[facility] on 5/25 negative for vegetations, however CT Chest on 5/24 positive for cavitary lung lesions likely lung abscess from septic embolism. Spoke with infectious disease-Dr. Tommy Medal, he suggested that we place a PICC line on 5/26, and complete at least 4 weeks of intravenous vancomycin.Will need Weekly CBC,CMET and vancomycin trough levels-please fax results to infectious disease clinic at [phone]. Please ensure follow-up with infectious disease  before discontinuing IV antibiotics, please discontinue PICC line once patient's finishes a course of IV antibiotics.  Active Problems: Generalized weakness/deconditioning/failure to thrive syndrome: Patient has had significant decline in the past few weeks prior to this admission with worsening generalized pain, loss of appetite and generalized weakness. Unfortunately, her deconditioning has only worsened due to acute illness and pneumonia. PT evaluation completed, recommendations are for SNF-patient is agreeable.  Generalized/Diffuse Pain: suspect secondary to multiple osseous mets, although still persistent she is seems more comfortable today with Decadron, Fentanyl transdermal and  and prn MSIR. May need to be started on bisphosphonates if pain is still an issue in the next few days.Followed by palliative care closely.  Mildly elevated LFTs: Now normalized-likely due to acute infection, but given history of malignancy-liver metastases is a cause of concern. RUQ ultrasound no obvious metastatic lesions. Recent PET scan on 08/31/15 did not show any major hepatic lesions as well. For now continue to follow.  Epigastric/lower chest pain: better-described it as "indigestion"-continue PPI, and prn GI cocktail. EKG-no acute/new changes. Suspect no further work up required at this point-except following clinical course  Hypokalemia:repleted   Anemia: Likely secondary to chronic disease-worsened by acute illness, no evidence of blood loss. Follow CBC  HTN: Continue to  hold both Coreg and lisinopril on discharge-but pressure currently controlled without antihypertensives.  History of extensive superficial venous thrombosis: Maintained on coumadin at home-continue coumadin on discharge, please check INR periodically  History of metastatic breast cancer: Recent PET scan on 08/31/15 showed numerous osseous mets-followed by Dr [name]. Recent MRI entire spine on 4/28 showed multiple osseous spinal mets with  no cord compression.Obviously with fever/PNA/MRSA bacteremia-plans for chemo and port placement have been  postponed.Seen by Oncology -with tentative plans are to start cyclophosphamide, methotrexate, and fluorouracil on 10/02/2015. Also seen by Rad Onc-underwent simulation 5/25, and palliative RTx next week.  Anorexia: Likely secondary to underlying malignancy worsened by pneumonia.   Protein-calorie malnutrition, severe: Continue supplements  TODAY-DAY OF DISCHARGE:  Subjective:   [name] today has no headache,no chest abdominal pain,no new weakness tingling or numbness.Patient continues to be very frustrated with the overall situation and health. Although she continues to complain of generalized pain-she appears much more comfortable with the current regimen. She at times appears tearful, and has multitude of complaints visit with the nursing staff-I suspect this is more of frustration with overall health  Objective:   Blood pressure 139/73, pulse 73, temperature 97.8 F (36.6 C), temperature source Oral, resp. rate 18, height 5\' 2"  (1.575 m), weight 59.648 kg (131 lb 8 oz), SpO2 99 %.  Intake/Output Summary (Last 24 hours) at 09/22/15 1021 Last data filed at 09/21/15 1746  Gross per 24 hour  Intake    800 ml  Output      0 ml  Net    800 ml   Filed Weights   09/14/15 1730  Weight: 59.648 kg (131 lb 8 oz)    Exam Awake Alert, Oriented *3, No new F.N deficits, Normal affect [place].AT,PERRAL Supple Neck,No JVD, No cervical lymphadenopathy appriciated.  Symmetrical Chest wall movement, Good air movement bilaterally, CTAB RRR,No Gallops,Rubs or new Murmurs, No Parasternal Heave +ve B.Sounds, Abd Soft, Non tender, No organomegaly appriciated, No rebound -guarding or rigidity. No Cyanosis, Clubbing or edema, No new Rash or bruise  DISCHARGE CONDITION: Stable  DISPOSITION: SNF  DISCHARGE INSTRUCTIONS:    Activity:  As tolerated with Full fall precautions use walker/cane  & assistance as needed  Get Medicines reviewed and adjusted: Please take all your medications with you for your next visit with your Primary MD  Please request your Primary MD to go over all hospital tests and procedure/radiological results at the follow up, please ask your Primary MD to get all Hospital records sent to his/her office.  If you experience worsening of your admission symptoms, develop shortness of breath, life threatening emergency, suicidal or homicidal thoughts you must seek medical attention immediately by calling 911 or calling your MD immediately  if symptoms less severe.  You must read complete instructions/literature along with all the possible adverse reactions/side effects for all the Medicines you take and that have been prescribed to you. Take any new Medicines after you have completely understood and accpet all the possible adverse reactions/side effects.   Do not drive when taking Pain medications.   Do not take more than prescribed Pain, Sleep and Anxiety Medications  Special Instructions: If you have smoked or chewed Tobacco  in the last 2 yrs please stop smoking, stop any regular Alcohol  and or any Recreational drug use.  Wear Seat belts while driving.  Please note  You were cared for by a hospitalist during your hospital stay. Once you are discharged, your primary care physician will handle any  further medical issues. Please note that NO REFILLS for any discharge medications will be authorized once you are discharged, as it is imperative that you return to your primary care physician (or establish a relationship with a primary care physician if you do not have one) for your aftercare needs so that they can reassess your need for medications and monitor your lab values.   Diet recommendation: Regular Diet   Discharge Instructions    Call MD for:  persistant nausea and vomiting    Complete by:  As directed      Call MD for:  severe uncontrolled pain     Complete by:  As directed      Call MD for:  temperature >100.4    Complete by:  As directed      Diet general    Complete by:  As directed      Increase activity slowly    Complete by:  As directed            Follow-up Information    Follow up with [facility] .   Specialty:  Skilled Nursing Facility   Contact information:   [address] [place] [phone]      Follow up with Chauncey Cruel, MD.   Specialty:  Oncology   Why:  Office will call with date/time   Contact information:   [place] [address] [phone]       Follow up with Gery Pray, MD.   Specialty:  Radiation Oncology   Why:  Office will call with date/time   Contact information:   [place] [address] [phone]       Total Time spent on discharge equals 45 minutes.  SignedOren Binet 09/22/2015 10:21 AM

## 2015-09-22 NOTE — Care Management Important Message (Signed)
Important Message  Patient Details  Name: [name] MRN: [id number] Date of Birth: August 18, 1945   Medicare Important Message Given:  Yes    [name] 09/22/2015, 9:09 AMImportant Message  Patient Details  Name: [name] MRN: [id number] Date of Birth: 02/21/1946   Medicare Important Message Given:  Yes    [name] 09/22/2015, 9:09 AM

## 2015-09-22 NOTE — Progress Notes (Signed)
PT Cancellation Note  Patient Details Name: [name] MRN: [id number] DOB: 1945/11/22   Cancelled Treatment:    Reason Eval/Treat Not Completed: Pain limiting ability to participate. Pt declined to participate with PT prior to d/c to SNF today.    Weston Anna, MPT Pager: [phone]

## 2015-09-22 NOTE — Clinical Social Work Placement (Signed)
Patient is set to discharge to [facility] today. Patient aware & CSW left voicemail for patient's friend/POA, Lennette Bihari (ph#: [phone]). Discharge packet given to RN, Catie. [facility] called for transport to pickup at 12:00.     Raynaldo Opitz, [facility] Clinical Social Worker cell #: [phone]    CLINICAL SOCIAL WORK PLACEMENT  NOTE  Date:  09/22/2015  Patient Details  Name: [name] MRN: [id number] Date of Birth: July 14, 1945  Clinical Social Work is seeking post-discharge placement for this patient at the [place] level of care (*CSW will initial, date and re-position this form in  chart as items are completed):  Yes   Patient/family provided with [facility] Work Department's list of facilities offering this level of care within the geographic area requested by the patient (or if unable, by the patient's family).  Yes   Patient/family informed of their freedom to choose among providers that offer the needed level of care, that participate in Medicare, Medicaid or managed care program needed by the patient, have an available bed and are willing to accept the patient.  Yes   Patient/family informed of [facility]'s ownership interest in [facility] and [facility], as well as of the fact that they are under no obligation to receive care at these facilities.  PASRR submitted to EDS on 09/18/15     PASRR number received on       Existing PASRR number confirmed on       FL2 transmitted to all facilities in geographic area requested by pt/family on 09/18/15     FL2 transmitted to all facilities within larger geographic area on       Patient informed that his/her managed care company has contracts with or will negotiate with certain facilities, including the following:        Yes   Patient/family informed of bed offers received.  Patient chooses bed at [facility]     Physician recommends  and patient chooses bed at      Patient to be transferred to [facility] on 09/22/15.  Patient to be transferred to facility by [facility]     Patient family notified on 09/22/15 of transfer.  Name of family member notified:  message left for patient's POA, Lennette Bihari     PHYSICIAN       Additional Comment:    _______________________________________________ Standley Brooking, LCSW 09/22/2015, 10:41 AM

## 2015-09-22 NOTE — Progress Notes (Signed)
Peripherally Inserted Central Catheter/Midline Placement  The IV Nurse has discussed with the patient and/or persons authorized to consent for the patient, the purpose of this procedure and the potential benefits and risks involved with this procedure.  The benefits include less needle sticks, lab draws from the catheter and patient may be discharged home with the catheter.  Risks include, but not limited to, infection, bleeding, blood clot (thrombus formation), and puncture of an artery; nerve damage and irregular heat beat.  Alternatives to this procedure were also discussed.  PICC/Midline Placement Documentation        [name] 09/22/2015, 8:56 AM

## 2015-09-23 ENCOUNTER — Telehealth: Payer: Self-pay | Admitting: Hospice and Palliative Medicine

## 2015-09-23 NOTE — Telephone Encounter (Signed)
Received call from Ms. Henion on after hours palliative care number that she had left skilled facility last evening (where she was discharged from the hospital yesterday) [place].  She does not feel she was getting adequate care at the facility. She was calling to inquire what she should do regarding the fact she is supposed to be getting IV antibiotics as well as be on oxygen.  I called her back and she reports that her friend is actually on the way to take her to the local emergency room in order to be evaluated. I told her that I agreed that she does need to be seen and evaluated this evening.  I advised her that if her friend is not able to get her to the hospital in a timely manner, she should call emergency services in order to transport her to the hospital.  She expressed understanding and stated that her friend is actually on her way from work now to pick her up and take her to the local hospital.  Micheline Rough, MD [place] [facility] [phone]

## 2015-09-24 ENCOUNTER — Encounter (HOSPITAL_COMMUNITY): Payer: Self-pay | Admitting: *Deleted

## 2015-09-24 ENCOUNTER — Telehealth: Payer: Self-pay | Admitting: Family Medicine

## 2015-09-24 ENCOUNTER — Inpatient Hospital Stay (HOSPITAL_COMMUNITY)
Admission: AD | Admit: 2015-09-24 | Discharge: 2015-10-04 | DRG: 193 | Disposition: A | Discharge status: Skilled Nursing Facility | Payer: Medicare PPO | Source: Other Acute Inpatient Hospital | Attending: Internal Medicine | Admitting: Internal Medicine

## 2015-09-24 ENCOUNTER — Inpatient Hospital Stay (HOSPITAL_COMMUNITY): Payer: Medicare PPO

## 2015-09-24 DIAGNOSIS — C50919 Malignant neoplasm of unspecified site of unspecified female breast: Secondary | ICD-10-CM | POA: Diagnosis present

## 2015-09-24 DIAGNOSIS — R791 Abnormal coagulation profile: Secondary | ICD-10-CM | POA: Diagnosis not present

## 2015-09-24 DIAGNOSIS — Z452 Encounter for adjustment and management of vascular access device: Secondary | ICD-10-CM

## 2015-09-24 DIAGNOSIS — Z66 Do not resuscitate: Secondary | ICD-10-CM | POA: Diagnosis present

## 2015-09-24 DIAGNOSIS — I959 Hypotension, unspecified: Secondary | ICD-10-CM | POA: Diagnosis present

## 2015-09-24 DIAGNOSIS — Y848 Other medical procedures as the cause of abnormal reaction of the patient, or of later complication, without mention of misadventure at the time of the procedure: Secondary | ICD-10-CM | POA: Diagnosis present

## 2015-09-24 DIAGNOSIS — D638 Anemia in other chronic diseases classified elsewhere: Secondary | ICD-10-CM | POA: Diagnosis present

## 2015-09-24 DIAGNOSIS — Z79899 Other long term (current) drug therapy: Secondary | ICD-10-CM

## 2015-09-24 DIAGNOSIS — G893 Neoplasm related pain (acute) (chronic): Secondary | ICD-10-CM | POA: Diagnosis present

## 2015-09-24 DIAGNOSIS — N179 Acute kidney failure, unspecified: Secondary | ICD-10-CM | POA: Diagnosis present

## 2015-09-24 DIAGNOSIS — I2690 Septic pulmonary embolism without acute cor pulmonale: Secondary | ICD-10-CM

## 2015-09-24 DIAGNOSIS — C7951 Secondary malignant neoplasm of bone: Secondary | ICD-10-CM | POA: Diagnosis present

## 2015-09-24 DIAGNOSIS — Z886 Allergy status to analgesic agent status: Secondary | ICD-10-CM

## 2015-09-24 DIAGNOSIS — Z800 Family history of malignant neoplasm of digestive organs: Secondary | ICD-10-CM | POA: Diagnosis not present

## 2015-09-24 DIAGNOSIS — W1830XA Fall on same level, unspecified, initial encounter: Secondary | ICD-10-CM | POA: Diagnosis present

## 2015-09-24 DIAGNOSIS — Z808 Family history of malignant neoplasm of other organs or systems: Secondary | ICD-10-CM | POA: Diagnosis not present

## 2015-09-24 DIAGNOSIS — I809 Phlebitis and thrombophlebitis of unspecified site: Secondary | ICD-10-CM | POA: Diagnosis present

## 2015-09-24 DIAGNOSIS — J850 Gangrene and necrosis of lung: Secondary | ICD-10-CM | POA: Diagnosis present

## 2015-09-24 DIAGNOSIS — Z91018 Allergy to other foods: Secondary | ICD-10-CM | POA: Diagnosis not present

## 2015-09-24 DIAGNOSIS — Y95 Nosocomial condition: Secondary | ICD-10-CM | POA: Diagnosis present

## 2015-09-24 DIAGNOSIS — Z6822 Body mass index (BMI) 22.0-22.9, adult: Secondary | ICD-10-CM

## 2015-09-24 DIAGNOSIS — D72829 Elevated white blood cell count, unspecified: Secondary | ICD-10-CM

## 2015-09-24 DIAGNOSIS — Z8672 Personal history of thrombophlebitis: Secondary | ICD-10-CM

## 2015-09-24 DIAGNOSIS — Z7952 Long term (current) use of systemic steroids: Secondary | ICD-10-CM

## 2015-09-24 DIAGNOSIS — R945 Abnormal results of liver function studies: Secondary | ICD-10-CM

## 2015-09-24 DIAGNOSIS — R7989 Other specified abnormal findings of blood chemistry: Secondary | ICD-10-CM | POA: Diagnosis present

## 2015-09-24 DIAGNOSIS — J9601 Acute respiratory failure with hypoxia: Secondary | ICD-10-CM | POA: Diagnosis present

## 2015-09-24 DIAGNOSIS — T80219A Unspecified infection due to central venous catheter, initial encounter: Secondary | ICD-10-CM | POA: Diagnosis not present

## 2015-09-24 DIAGNOSIS — K219 Gastro-esophageal reflux disease without esophagitis: Secondary | ICD-10-CM | POA: Diagnosis present

## 2015-09-24 DIAGNOSIS — R1084 Generalized abdominal pain: Secondary | ICD-10-CM | POA: Diagnosis present

## 2015-09-24 DIAGNOSIS — Z7901 Long term (current) use of anticoagulants: Secondary | ICD-10-CM

## 2015-09-24 DIAGNOSIS — R609 Edema, unspecified: Secondary | ICD-10-CM

## 2015-09-24 DIAGNOSIS — Z9013 Acquired absence of bilateral breasts and nipples: Secondary | ICD-10-CM

## 2015-09-24 DIAGNOSIS — Z9049 Acquired absence of other specified parts of digestive tract: Secondary | ICD-10-CM | POA: Diagnosis not present

## 2015-09-24 DIAGNOSIS — J984 Other disorders of lung: Secondary | ICD-10-CM

## 2015-09-24 DIAGNOSIS — I10 Essential (primary) hypertension: Secondary | ICD-10-CM | POA: Diagnosis present

## 2015-09-24 DIAGNOSIS — Z885 Allergy status to narcotic agent status: Secondary | ICD-10-CM

## 2015-09-24 DIAGNOSIS — R531 Weakness: Secondary | ICD-10-CM | POA: Diagnosis present

## 2015-09-24 DIAGNOSIS — E43 Unspecified severe protein-calorie malnutrition: Secondary | ICD-10-CM | POA: Diagnosis present

## 2015-09-24 DIAGNOSIS — Z9221 Personal history of antineoplastic chemotherapy: Secondary | ICD-10-CM

## 2015-09-24 DIAGNOSIS — Z87891 Personal history of nicotine dependence: Secondary | ICD-10-CM

## 2015-09-24 DIAGNOSIS — R0781 Pleurodynia: Secondary | ICD-10-CM | POA: Diagnosis present

## 2015-09-24 DIAGNOSIS — R7881 Bacteremia: Secondary | ICD-10-CM | POA: Diagnosis present

## 2015-09-24 DIAGNOSIS — Z801 Family history of malignant neoplasm of trachea, bronchus and lung: Secondary | ICD-10-CM | POA: Diagnosis not present

## 2015-09-24 DIAGNOSIS — A4902 Methicillin resistant Staphylococcus aureus infection, unspecified site: Secondary | ICD-10-CM | POA: Diagnosis not present

## 2015-09-24 DIAGNOSIS — Z959 Presence of cardiac and vascular implant and graft, unspecified: Secondary | ICD-10-CM | POA: Diagnosis not present

## 2015-09-24 DIAGNOSIS — B9562 Methicillin resistant Staphylococcus aureus infection as the cause of diseases classified elsewhere: Secondary | ICD-10-CM | POA: Diagnosis present

## 2015-09-24 DIAGNOSIS — Z923 Personal history of irradiation: Secondary | ICD-10-CM

## 2015-09-24 DIAGNOSIS — C801 Malignant (primary) neoplasm, unspecified: Secondary | ICD-10-CM

## 2015-09-24 DIAGNOSIS — Z7189 Other specified counseling: Secondary | ICD-10-CM | POA: Insufficient documentation

## 2015-09-24 DIAGNOSIS — J189 Pneumonia, unspecified organism: Secondary | ICD-10-CM | POA: Diagnosis present

## 2015-09-24 DIAGNOSIS — C773 Secondary and unspecified malignant neoplasm of axilla and upper limb lymph nodes: Secondary | ICD-10-CM | POA: Diagnosis not present

## 2015-09-24 DIAGNOSIS — C44501 Unspecified malignant neoplasm of skin of breast: Secondary | ICD-10-CM | POA: Diagnosis not present

## 2015-09-24 LAB — LACTIC ACID, PLASMA: Lactic Acid, Venous: 1.6 mmol/L (ref 0.5–2.0)

## 2015-09-24 LAB — URINE MICROSCOPIC-ADD ON

## 2015-09-24 LAB — URINALYSIS, ROUTINE W REFLEX MICROSCOPIC
Bilirubin Urine: NEGATIVE
GLUCOSE, UA: NEGATIVE mg/dL
KETONES UR: NEGATIVE mg/dL
Leukocytes, UA: NEGATIVE
Nitrite: NEGATIVE
PROTEIN: NEGATIVE mg/dL
Specific Gravity, Urine: 1.013 (ref 1.005–1.030)
pH: 5 (ref 5.0–8.0)

## 2015-09-24 LAB — PROTIME-INR
INR: 1.96 — AB (ref 0.00–1.49)
PROTHROMBIN TIME: 22.2 s — AB (ref 11.6–15.2)

## 2015-09-24 LAB — VANCOMYCIN, RANDOM: Vancomycin Rm: 8 ug/mL

## 2015-09-24 LAB — APTT: APTT: 40 s — AB (ref 24–37)

## 2015-09-24 MED ORDER — POLYVINYL ALCOHOL 1.4 % OP SOLN
1.0000 [drp] | OPHTHALMIC | Status: DC | PRN
  Filled 2015-09-24: qty 15

## 2015-09-24 MED ORDER — PANTOPRAZOLE SODIUM 40 MG PO TBEC
40.0000 mg | DELAYED_RELEASE_TABLET | Freq: Every day | ORAL | Status: DC
  Administered 2015-09-24 – 2015-10-01 (×2): 40 mg via ORAL
  Filled 2015-09-24 (×10): qty 1

## 2015-09-24 MED ORDER — POLYETHYLENE GLYCOL 3350 17 G PO PACK
17.0000 g | PACK | Freq: Every day | ORAL | Status: DC
  Filled 2015-09-24 (×6): qty 1

## 2015-09-24 MED ORDER — LEVOFLOXACIN IN D5W 750 MG/150ML IV SOLN
750.0000 mg | INTRAVENOUS | Status: DC
  Administered 2015-09-25: 750 mg via INTRAVENOUS
  Filled 2015-09-24: qty 150

## 2015-09-24 MED ORDER — SODIUM CHLORIDE 0.9 % IV BOLUS (SEPSIS)
500.0000 mL | Freq: Once | INTRAVENOUS | Status: AC
  Administered 2015-09-24: 500 mL via INTRAVENOUS

## 2015-09-24 MED ORDER — ONDANSETRON HCL 4 MG/2ML IJ SOLN
4.0000 mg | Freq: Four times a day (QID) | INTRAMUSCULAR | Status: DC | PRN
  Administered 2015-09-27 – 2015-10-03 (×3): 4 mg via INTRAVENOUS
  Filled 2015-09-24 (×3): qty 2

## 2015-09-24 MED ORDER — MAGIC MOUTHWASH W/LIDOCAINE
10.0000 mL | Freq: Three times a day (TID) | ORAL | Status: DC | PRN
  Filled 2015-09-24: qty 10

## 2015-09-24 MED ORDER — MORPHINE SULFATE 15 MG PO TABS
15.0000 mg | ORAL_TABLET | ORAL | Status: DC | PRN
  Administered 2015-09-24 – 2015-09-28 (×15): 15 mg via ORAL
  Filled 2015-09-24 (×15): qty 1

## 2015-09-24 MED ORDER — LIDOCAINE 5 % EX PTCH
1.0000 | MEDICATED_PATCH | CUTANEOUS | Status: DC
  Administered 2015-09-24 – 2015-10-04 (×11): 1 via TRANSDERMAL
  Filled 2015-09-24 (×11): qty 1

## 2015-09-24 MED ORDER — DEXAMETHASONE 2 MG PO TABS
2.0000 mg | ORAL_TABLET | Freq: Two times a day (BID) | ORAL | Status: DC
  Administered 2015-09-24 – 2015-10-02 (×18): 2 mg via ORAL
  Filled 2015-09-24 (×22): qty 1

## 2015-09-24 MED ORDER — SODIUM CHLORIDE 0.9 % IV SOLN
INTRAVENOUS | Status: DC
  Administered 2015-09-24 – 2015-09-27 (×6): via INTRAVENOUS

## 2015-09-24 MED ORDER — SODIUM CHLORIDE 0.9 % IV BOLUS (SEPSIS): 1000.0000 mL | Freq: Once | INTRAVENOUS | Status: DC

## 2015-09-24 MED ORDER — ONDANSETRON HCL 4 MG PO TABS: 4.0000 mg | ORAL_TABLET | Freq: Four times a day (QID) | ORAL | Status: DC | PRN

## 2015-09-24 MED ORDER — ACETAMINOPHEN 325 MG PO TABS: 650.0000 mg | ORAL_TABLET | Freq: Four times a day (QID) | ORAL | Status: DC | PRN

## 2015-09-24 MED ORDER — PIPERACILLIN-TAZOBACTAM 3.375 G IVPB 30 MIN
3.3750 g | Freq: Once | INTRAVENOUS | Status: AC
  Administered 2015-09-24: 3.375 g via INTRAVENOUS
  Filled 2015-09-24: qty 50

## 2015-09-24 MED ORDER — WARFARIN SODIUM 4 MG PO TABS
4.0000 mg | ORAL_TABLET | Freq: Once | ORAL | Status: AC
  Administered 2015-09-24: 4 mg via ORAL
  Filled 2015-09-24: qty 1

## 2015-09-24 MED ORDER — SODIUM CHLORIDE 0.9% FLUSH
3.0000 mL | Freq: Two times a day (BID) | INTRAVENOUS | Status: DC
  Administered 2015-09-25 – 2015-10-04 (×13): 3 mL via INTRAVENOUS

## 2015-09-24 MED ORDER — SODIUM CHLORIDE 0.9% FLUSH
10.0000 mL | INTRAVENOUS | Status: DC | PRN
  Administered 2015-09-25 – 2015-10-04 (×2): 10 mL
  Filled 2015-09-24 (×2): qty 40

## 2015-09-24 MED ORDER — POLYETHYL GLYCOL-PROPYL GLYCOL 0.4-0.3 % OP GEL: Freq: Every day | OPHTHALMIC | Status: DC | PRN

## 2015-09-24 MED ORDER — DOCUSATE SODIUM 100 MG PO CAPS
100.0000 mg | ORAL_CAPSULE | Freq: Two times a day (BID) | ORAL | Status: DC
  Administered 2015-09-24 – 2015-09-28 (×6): 100 mg via ORAL
  Filled 2015-09-24 (×14): qty 1

## 2015-09-24 MED ORDER — WARFARIN - PHARMACIST DOSING INPATIENT: Freq: Every day | Status: DC

## 2015-09-24 MED ORDER — WARFARIN SODIUM 2 MG PO TABS: 2.0000 mg | ORAL_TABLET | Freq: Every day | ORAL | Status: DC

## 2015-09-24 MED ORDER — ACETAMINOPHEN 650 MG RE SUPP: 650.0000 mg | Freq: Four times a day (QID) | RECTAL | Status: DC | PRN

## 2015-09-24 MED ORDER — CHLORHEXIDINE GLUCONATE 0.12 % MT SOLN
15.0000 mL | Freq: Two times a day (BID) | OROMUCOSAL | Status: DC
  Administered 2015-09-24 – 2015-09-29 (×9): 15 mL via OROMUCOSAL
  Filled 2015-09-24 (×13): qty 15

## 2015-09-24 MED ORDER — VANCOMYCIN HCL IN DEXTROSE 1-5 GM/200ML-% IV SOLN
1000.0000 mg | INTRAVENOUS | Status: DC
  Administered 2015-09-25 – 2015-10-04 (×10): 1000 mg via INTRAVENOUS
  Filled 2015-09-24 (×11): qty 200

## 2015-09-24 MED ORDER — CETYLPYRIDINIUM CHLORIDE 0.05 % MT LIQD
7.0000 mL | Freq: Two times a day (BID) | OROMUCOSAL | Status: DC
  Administered 2015-09-24 – 2015-09-30 (×8): 7 mL via OROMUCOSAL

## 2015-09-24 MED ORDER — VANCOMYCIN HCL IN DEXTROSE 1-5 GM/200ML-% IV SOLN: 1000.0000 mg | Freq: Once | INTRAVENOUS | Status: DC

## 2015-09-24 MED ORDER — BOOST / RESOURCE BREEZE PO LIQD
1.0000 | Freq: Two times a day (BID) | ORAL | Status: DC
  Administered 2015-09-24: 1 via ORAL

## 2015-09-24 MED ORDER — PIPERACILLIN-TAZOBACTAM 3.375 G IVPB
3.3750 g | Freq: Three times a day (TID) | INTRAVENOUS | Status: DC
  Administered 2015-09-24 – 2015-09-25 (×2): 3.375 g via INTRAVENOUS
  Filled 2015-09-24 (×2): qty 50

## 2015-09-24 MED ORDER — VANCOMYCIN HCL 10 G IV SOLR
1250.0000 mg | Freq: Once | INTRAVENOUS | Status: AC
  Administered 2015-09-24: 1250 mg via INTRAVENOUS
  Filled 2015-09-24: qty 1250

## 2015-09-24 MED ORDER — VANCOMYCIN HCL IN DEXTROSE 750-5 MG/150ML-% IV SOLN: 750.0000 mg | Freq: Two times a day (BID) | INTRAVENOUS | Status: DC

## 2015-09-24 MED ORDER — FENTANYL 50 MCG/HR TD PT72
50.0000 ug | MEDICATED_PATCH | TRANSDERMAL | Status: DC
  Administered 2015-09-27 – 2015-10-03 (×3): 50 ug via TRANSDERMAL
  Filled 2015-09-24 (×3): qty 1

## 2015-09-24 NOTE — H&P (Signed)
History and Physical    [name] JJH:[id number] DOB: 1946/04/29 DOA: 09/24/2015  PCP: Curlene Labrum, MD  ONCOLOGY: Dr. [name] Radiation Oncology: Dr. Sondra Come ID: Dr. Tommy Medal  Patient coming from: [facility] in [place]  Chief Complaint: Transfer from outside hospital  HPI: [name] is a 70 y.o. woman with a history of metastatic breast cancer, bone pain anticipating palliative radiation therapy, severe protein calorie malnutrition with adult failure to thrive, HTN, and GERD who was just discharged from this hospital on 09/22/2015 and sent to SNF for management of MRSA bacteremia.  The plan was for her to complete a 4 week course of IV vancomycin from 09/19/2015.  She left SNF AMA at some point yesterday, reporting that she was not receiving pain medications or her antibiotics as ordered.  She was taken home by a friend (without access to supplemental oxygen).  She became profoundly weak and was found down.  She insists that she did no lose consciousness; she was just too weak to get herself up.  She complains of chronic pain at this point.  She had left sided pleuritic chest pain that improved with oxygen.  She reports that she was only SOB when she was off of oxygen.  Again, no LOC.  No evidence of acute blood loss.  No nausea or vomiting ("I don't know why they gave me Zofran at the other hospital.").  1-2 loose stools daily.  ED Course: The patient was evaluated in the ED at [facility].  Pertinent findings include new leukocytosis compared to discharge and evidence of acute kidney injury.  No documented fever.  Alk phos remains elevated, but LFTs have otherwise improved.  Protein levels remain low.  CT chest interpreted as multifocal pneumonia; I suspect this represents the same process that was identified here by chest CT on Sep 20, 2015.  The patient received IV levaquin, 1L NS bolus, anti-emetics and analgesics prior to transfer to [facility].  She is stable on 2L  at  this time.  Review of Systems: As per HPI otherwise 10 point review of systems negative.   Past Medical History  Diagnosis Date  . Cancer (HCC)     RBreast, Lower back , Under l Arm  . Hypertension   . GERD (gastroesophageal reflux disease)   . Breast cancer metastasized to bone ([place]) 04/30/2011  . Radiation 01/15/2012    Lumbosacral spine 3500 cGy 14  fx  . Swollen R ankle 08/10/2012  . Fracture of ankle, medial malleolus, right, closed 08/10/2012  . Tubular adenoma 2011  . Hiatal hernia 2011  . Schatzki's ring   . Anorexia 03/02/2013  . Weight loss due to medication 03/02/2013  . C. difficile colitis   . Pneumonia   . 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 Gallbladder surgery  1991  . Mastectomy  06/28/10    bilateral- Dr. Rosebud Poles, [place]  . Inguinal hernia repair  90's    right  . Parathyroidectomy  90's  . Carpal tunnel release  90's  . Oophorectomy  90's    right  . Lymph node dissection    . Breast surgery    . Cholecystectomy    . Colonoscopy  01/08/10    Dr. [name]- anal tag,hemorrhoid o/w normal rectum, pancolonic diverticula, diminutive polyp in the base of the cecum= tubular adenoma on bx. poor prep  . Esophagogastroduodenoscopy  01/08/10    Dr. [name]-  normal esophagus, small hiatal hernia, antum and body erosions, pedunculated polyp between D1 and D2, duodenal diverticulum, lymphangiectasia, second portion of the duodenum.stomach bx= inflammation, duodenum bx= adenoma  . Rotator cuff repair  08/27/11    Dr. Alphonzo Cruise- [facility]- [place] [facility]  . Esophagogastroduodenoscopy (egd) with propofol N/A 11/19/2012    RMR: non-critical Schatzki's ring s/p dilation with 35 F, multiple gastric polyps, duodenal polyp s/p piecemeal snare polypectomy, gastric polypectomy, path benign  . Maloney dilation N/A 11/19/2012    Procedure: Venia Minks DILATION;  Surgeon: Daneil Dolin, MD;  Location: [facility] ORS;  Service: Endoscopy;  Laterality: N/A;  #[id number]    . Polypectomy N/A 11/19/2012    Procedure: POLYPECTOMY;  Surgeon: Daneil Dolin, MD;  Location: [facility]S;  Service: Endoscopy;  Laterality: N/A;  duodenal  . Biopsy N/A 11/19/2012    Procedure: BIOPSY;  Surgeon: Daneil Dolin, MD;  Location: [facility];  Service: Endoscopy;  Laterality: N/A;  esophageal  . Tee without cardioversion N/A 09/21/2015    Procedure: TRANSESOPHAGEAL ECHOCARDIOGRAM (TEE);  Surgeon: Sanda Klein, MD;  Location[facility] ENDOSCOPY;  Service: Cardiovascular;  Laterality: N/A;     reports that she quit smoking about 32 years ago. She has never used smokeless tobacco. She reports that she does not drink alcohol or use illicit drugs.  Allergies  Allergen Reactions  . Aspirin Nausea And Vomiting  . Boost Pudding [Nutritional Supplements] Diarrhea  . Percodan [Oxycodone-Aspirin] Nausea Only  . Codeine Nausea Only    Family History  Problem Relation Age of Onset  . Cancer Mother     Colon  . Cancer Father     Lung, Mouth,leg    Prior to Admission medications   Medication Sig Start Date End Date Taking? Authorizing Provider  dexamethasone (DECADRON) 2 MG tablet Take 1 tablet (2 mg total) by mouth every 12 (twelve) hours. 09/22/15   Shanker Kristeen Mans, MD  feeding supplement (BOOST / RESOURCE BREEZE) LIQD Take 1 Container by mouth 2 (two) times daily between meals. 09/22/15   Shanker Kristeen Mans, MD  fentaNYL (DURAGESIC - DOSED MCG/HR) 50 MCG/HR Place 1 patch (50 mcg total) onto the skin every 3 (three) days. 09/22/15   Shanker Kristeen Mans, MD  lidocaine (LIDODERM) 5 % Place 1 patch onto the skin daily. Remove & Discard patch within 12 hours or as directed by MD 09/22/15   Jonetta Osgood, MD  magic mouthwash w/lidocaine SOLN Take 10 mLs by mouth 3 (three) times daily as needed for mouth pain. 09/22/15   Shanker Kristeen Mans, MD  morphine (MSIR) 15 MG tablet Take 1 tablet (15 mg total) by mouth every 4 (four) hours as needed for severe pain. Once every 12 hours as needed 09/22/15   Jonetta Osgood, MD  ondansetron (ZOFRAN-ODT) 8 MG disintegrating tablet DISSOLVE 1 TABLET ON THE TONGUE EVERY 8 HOURS AS NEEDED FOR NAUSEA 09/22/15   Shanker Kristeen Mans, MD  pantoprazole (PROTONIX) 40 MG tablet Take 1 tablet (40 mg total) by mouth daily. 09/22/15   Shanker Kristeen Mans, MD  Polyethyl Glycol-Propyl Glycol (SYSTANE OP) Apply 1 drop to eye daily as needed (dry eyes).    Historical Provider, MD  polyethylene glycol (MIRALAX / GLYCOLAX) packet Take 17 g by mouth daily. 09/22/15   Shanker Kristeen Mans, MD  Vancomycin (VANCOCIN) 750-5 MG/150ML-% SOLN Inject 150 mLs (750 mg total) into the vein every 12 (twelve) hours. FOR 4 WEEKS FROM 09/19/15 09/22/15   Jonetta Osgood, MD  warfarin (COUMADIN) 4 MG  tablet Take 1 tablet (4 mg total) by mouth daily. Patient taking differently: Take 2-4 mg by mouth daily. Take 2 mg on Mon thru Thurs & Take 4 mg on Fri, Sat, Sun 02/28/15   Chauncey Cruel, MD    Physical Exam: Danley Danker Vitals:   09/24/15 0605  Height: 5' 2"  (1.575 m)  Weight: 56.337 kg (124 lb 3.2 oz)   T 97.8 HR 87 BP 85/53 RR 17  O2 sat 100% on 2L [place]  Constitutional: NAD, calm, comfortable Filed Vitals:   09/24/15 0605  Height: 5' 2"  (1.575 m)  Weight: 56.337 kg (124 lb 3.2 oz)   Eyes: PERRL, lids and conjunctivae normal ENMT: Mucous membranes are moist. Posterior pharynx clear of any exudate or lesions. Neck: normal, supple Respiratory: No significant wheeze or ronchi.  No accessory muscle use.  Normal inspiratory effort. Cardiovascular: Normal rate but irregular.  No murmurs / rubs / gallops. Bilateral ankle edema.  2+ pedal pulses. Abdomen: no tenderness, no masses palpated. Bowel sounds positive.  Musculoskeletal: No joint deformity upper and lower extremities. Good ROM, no contractures. Normal muscle tone.  Skin: Pale, cool, prominent veins Neurologic: CN 2-12 grossly intact. Sensation intact, Strength 5/5 in all 4.  Psychiatric: Normal judgment and insight. Alert and oriented x 3.  Normal mood.   Labs on Admission: I have personally reviewed following labs and imaging studies  Labs drawn this morning from outside hospital notable for: WBC 18.5 Hgb 9 Hct 27 Plt 244 Alk Phos 185, AST and ALT have normalized BUN 46 Creatinine 1.89  Coagulation Profile:  Recent Labs Lab 09/18/15 0508 09/19/15 0516 09/20/15 0539 09/21/15 0505 09/22/15 0507  INR 2.17* 2.93* 2.11* 2.04* 2.11*   Urine analysis: Pending  Sepsis Labs:  Recent Results (from the past 240 hour(s))  Culture, blood (routine x 2)     Status: None   Collection Time: 09/14/15  6:35 PM  Result Value Ref Range Status   Specimen Description BLOOD RIGHT ARM  Final   Special Requests BOTTLES DRAWN AEROBIC AND ANAEROBIC [place]  Final   Culture   Final    NO GROWTH 5 DAYS Performed at [facility]    Report Status 09/19/2015 FINAL  Final  Culture, blood (routine x 2)     Status: None   Collection Time: 09/14/15  6:40 PM  Result Value Ref Range Status   Specimen Description BLOOD LEFT HAND  Final   Special Requests BOTTLES DRAWN AEROBIC AND ANAEROBIC 5CC  Final   Culture   Final    NO GROWTH 5 DAYS Performed at [facility]    Report Status 09/19/2015 FINAL  Final  Culture, blood (Routine X 2) w Reflex to ID Panel     Status: None (Preliminary result)   Collection Time: 09/21/15  5:35 PM  Result Value Ref Range Status   Specimen Description BLOOD RIGHT ARM  Final   Special Requests BOTTLES DRAWN AEROBIC AND ANAEROBIC  5CC, 10CC  Final   Culture   Final    NO GROWTH 2 DAYS Performed at [facility]    Report Status PENDING  Incomplete  Culture, blood (Routine X 2) w Reflex to ID Panel     Status: None (Preliminary result)   Collection Time: 09/21/15  5:45 PM  Result Value Ref Range Status   Specimen Description BLOOD RIGHT ARM  Final   Special Requests IN PEDIATRIC BOTTLE  4CC  Final   Culture   Final    NO  GROWTH 2 DAYS Performed at [facility]    Report Status  PENDING  Incomplete     Radiological Exams on Admission: Repeat CT chest from OSH done today, disk is attached to paper chart.  Interpreted there as multifocal pneumonia; likely representing same process (cavitary lesions concerning for septic emboli related to MRSA bacteremia) that was identified here by chest CT on Sep 20, 2015.  EKG: Pending  Assessment/Plan Principal Problem:   MRSA bacteremia Active Problems:   GERD   Personal history of thrombophlebitis   Breast cancer metastasized to bone (HCC)   AKI (acute kidney injury) ([place])   Essential hypertension   Protein-calorie malnutrition, severe (HCC)   Pain from bone metastases (HCC)   Generalized abdominal pain   Elevated LFTs   Rib pain on left side   Acute septic pulmonary embolism (HCC)   Acute respiratory failure with hypoxia (HCC)   Leukocytosis  MRSA bacteremia with HCAP and probable septic embolic to the lung with new leukocytosis compared to discharge --Resume vancomycin, levaquin, and zosyn for now per pharmacy consult --Repeat U/A, urine culture, and blood cultures (one set peripheral and one set from PICC now)  History of metastatic breast cancer with bone pain: Recent PET scan on 08/31/15 showed numerous osseous mets-followed by Dr [name]. Recent MRI entire spine on 4/28 showed multiple osseous spinal mets with no cord compression.  Plan for port placement and chemotherapy postponed with last admission due to acute illness.   Palliative radiation therapy anticipated for skull, left rib cage, and right tibial lesions.  Will need to call Dr. [name] and Dr. Sondra Come tomorrow.  History of abnormal LFTs --Overall improved.  No evidence of obvious liver mets on recent imaging.  Continue to monitor.  Chronic pain --Fentanyl patch, MSIR prn (may need to change to dilaudid if renal failure persists)  GERD --Continue PPI  AKI with relative hypotension --Will aggressively hydrate and monitor for now --If renal  function does not improve, will need further work-up --Pharmacy to renally dose medications  Chronic anemia --Hgb appears stable for now, continue to monitor while on warfarin  Chronic anticoagulation for chronic, extensive superficial thrombophlebitis --pharmacy to manage warfarin, daily PT/INR  History of hypertension but now showing relative hypotension --Will bolus 2L NS now (for a total of 3L today) --Check lactic acid --NO BP meds --Currently asymptomatic  Anorexia: Likely secondary to underlying malignancy worsened by pneumonia.   Protein-calorie malnutrition, severe: Continue supplements   DVT prophylaxis: Anticoagulated with warfarin Code Status: DNR Family Communication: Patient alone at time of admission Disposition Plan: Will need case management assistance for placement at new SNF Consults called: NONE, will need to notify oncology, radiation oncology, and ID of repeat admission Admission status: Inpatient, telemetry   Eber Jones MD [facility]  If 7PM-7AM, please contact night-coverage [email or web address] Password [facility]  09/24/2015, 8:26 AM

## 2015-09-24 NOTE — Progress Notes (Signed)
Pharmacy Antibiotic Note  [name] is a 70 y.o. female known to Pharmacy from previous admission. She is now readmitted on 09/24/2015.  She was just discharged from this hospital on 09/22/2015 and sent to SNF for management of MRSA bacteremia. The plan was for her to complete a 4 week course of IV vancomycin from 09/19/2015. She left SNF AMA on 09/23/15, reporting that she was not receiving pain medications or her antibiotics as ordered. She was taken home by a friend where she became profoundly weak and was found down and subsequently brought to the ED at [facility] in [place], then transferred to [facility].  Pharmacy has been consulted to continue vancomycin dosing for MRSA bacteremia and add Zosyn and Levaquin for HCAP.  Labs from [facility] in [place]: WBC 18.5  Hgb 9  Hct 27  Plt 244  Alk Phos 185, AST and ALT have normalized  BUN 46  Creatinine 1.89 (0.5 two days ago) CrCl 30 ml/min/1.109m (normalized)  Plan: Zosyn 3.375gm IV q8h (4hr extended infusions) Levaquin 7571mIV q48h - next dose 5/29 at 22:00 since received 50013marly today at 2AM Vancomycin 1250m80m x 1 now, then 1g IV q24h, goal trough 15-20 mcg/ml Monitor renal function closely and adjust doses as needed  Height: _0  (157.5 cm) Weight: 124 lb 3.2 oz (56.337 kg) IBW/kg (Calculated) : 50.1  No data recorded.   Recent Labs Lab 09/21/15 0505 09/21/15 1734 09/24/15 0845 09/24/15 0945  CREATININE 0.50  --   --   --   LATICACIDVEN  --   --  1.6  --   VANCOTROUGH  --  11  --   --   VANCORANDOM  --   --   --  8    Estimated Creatinine Clearance: 51 mL/min (by C-G formula based on Cr of 0.5).    Allergies  Allergen Reactions  . Aspirin Nausea And Vomiting  . Boost Pudding [Nutritional Supplements] Diarrhea  . Percodan [Oxycodone-Aspirin] Nausea Only  . Codeine Nausea Only    Antimicrobials: 5/18 Cefepime >> 5/23 5/23 Ancef >> 5/23 5/18 Vancomycin >> 5/21, resume 5/23 >> 5/28 Zosyn  >> 5/28 Levaquin >>  Levels/dose changes this admission: 5/25 VT at 1730 =  11 on 500 mg q12h - increase to 750mg7m  [facility], ID consulted 5/18 BCx x2:  NGF 5/18 HIV antb: NR 5/18 ur strep pneu: neg 5/18 ur legionella: neg 5/25 BCx (repeat): ngtd  Cultures this Admit: 5/28 BCx: 5/28 UCx: 5/28 Sputum:   Thank you for allowing pharmacy to be a part of this patient's care.  Shwanda Soltis Peggyann JubarmD, BCPS Pager: [phone] 11:01 AM

## 2015-09-24 NOTE — Progress Notes (Signed)
ANTICOAGULATION CONSULT NOTE - Initial Consult  Pharmacy Consult for Warfarin Indication: History extensive superficial vein thrombosis  Allergies  Allergen Reactions  . Aspirin Nausea And Vomiting  . Boost Pudding [Nutritional Supplements] Diarrhea  . Percodan [Oxycodone-Aspirin] Nausea Only  . Codeine Nausea Only    Patient Measurements: Height: 5\' 2"  (157.5 cm) Weight: 124 lb 3.2 oz (56.337 kg) IBW/kg (Calculated) : 50.1  Vital Signs:    Labs:  Recent Labs  09/22/15 0507 09/24/15 0945  APTT  --  40*  LABPROT 23.5* 22.2*  INR 2.11* 1.96*    Estimated Creatinine Clearance: 51 mL/min (by C-G formula based on Cr of 0.5).   Medical History: Past Medical History  Diagnosis Date  . Cancer (HCC)     RBreast, Lower back , Under l Arm  . Hypertension   . GERD (gastroesophageal reflux disease)   . Breast cancer metastasized to bone ([place]) 04/30/2011  . Radiation 01/15/2012    Lumbosacral spine 3500 cGy 14  fx  . Swollen R ankle 08/10/2012  . Fracture of ankle, medial malleolus, right, closed 08/10/2012  . Tubular adenoma 2011  . Hiatal hernia 2011  . Schatzki's ring   . Anorexia 03/02/2013  . Weight loss due to medication 03/02/2013  . C. difficile colitis   . Pneumonia   . 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 antiseptic oral rinse  7 mL Mouth Rinse q12n4p  . chlorhexidine  15 mL Mouth Rinse BID  . dexamethasone  2 mg Oral Q12H  . docusate sodium  100 mg Oral BID  . feeding supplement  1 Container Oral BID BM  . [START ON 09/27/2015] fentaNYL  50 mcg Transdermal Q72H  . [START ON 09/25/2015] levofloxacin (LEVAQUIN) IV  750 mg Intravenous Q48H  . lidocaine  1 patch Transdermal Q24H  . pantoprazole  40 mg Oral Daily  . piperacillin-tazobactam  3.375 g Intravenous Once  . piperacillin-tazobactam (ZOSYN)  IV  3.375 g Intravenous Q8H  . polyethylene glycol  17 g Oral Daily  . sodium chloride  1,000 mL Intravenous Once  . sodium chloride  1,000  mL Intravenous Once  . sodium chloride flush  3 mL Intravenous Q12H  . vancomycin  1,250 mg Intravenous Once  . [START ON 09/25/2015] vancomycin  1,000 mg Intravenous Q24H  . warfarin  4 mg Oral ONCE-1800  . Warfarin - Pharmacist Dosing Inpatient   Does not apply q1800   Infusions:  . sodium chloride 100 mL/hr at 09/24/15 0952   PRN: acetaminophen **OR** acetaminophen, magic mouthwash w/lidocaine, morphine, ondansetron **OR** ondansetron (ZOFRAN) IV, polyvinyl alcohol, sodium chloride flush  Assessment: 70 y.o. woman with a history of metastatic breast cancer, bone pain anticipating palliative radiation therapy, recently discharged 5/26 then readmitted 5/28 after leaving SNF AMA.  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 INR is essentially therapeutic on admission (1.96) and Pharmacy is consulted to continue dosing while inpatient.  Dose prior to 5/18 admission reported as 2mg  daily except 4mg  Fri, Sat, Sun Recent dosing history in Epic reviewed: doses ranged from 1mg  to 4mg  to achieve therapeutic INR  Goal of Therapy:  INR 2-3   Plan:   Warfarin 4mg  PO x 1 at 18:00  Daily PT/INR  Peggyann Juba, PharmD, BCPS Pager: [phone] 09/24/2015,11:12 AM

## 2015-09-24 NOTE — Progress Notes (Signed)
MD via phone made aware 72/40 81/46 There are no other acute changes noted in Pt's assessment. Will give bolus and monitor Pt closely

## 2015-09-24 NOTE — Telephone Encounter (Signed)
Accepted to inpatient telemetry at [facility] from [facility].   Ms. Wi has breast cancer metastatic to bone (followed by Dr. [name]) and was just discharged on 5/26 following treatment for HCAP. She had MRSA bacteremia and was discharged to SNF for 4 wks vancomycin. Somehow she ended up back at home, living alone, instead of the SNF, wouldn't answer phone, and was found minimally responsive by friend.  Satting 70s on arrival to [facility] and with multifocal PNA on CT.  WBC 18k (had normalized prior to recent discharge). Vitals reportedly stable. Given 1 L normal saline and Levaquin. Report taken from Dr. Sydnee Levans.

## 2015-09-25 DIAGNOSIS — J850 Gangrene and necrosis of lung: Secondary | ICD-10-CM

## 2015-09-25 DIAGNOSIS — Z959 Presence of cardiac and vascular implant and graft, unspecified: Secondary | ICD-10-CM

## 2015-09-25 DIAGNOSIS — Z7189 Other specified counseling: Secondary | ICD-10-CM | POA: Insufficient documentation

## 2015-09-25 DIAGNOSIS — C50919 Malignant neoplasm of unspecified site of unspecified female breast: Secondary | ICD-10-CM

## 2015-09-25 DIAGNOSIS — C7951 Secondary malignant neoplasm of bone: Secondary | ICD-10-CM

## 2015-09-25 DIAGNOSIS — T80219A Unspecified infection due to central venous catheter, initial encounter: Secondary | ICD-10-CM | POA: Insufficient documentation

## 2015-09-25 LAB — CBC
HCT: 21 % — ABNORMAL LOW (ref 36.0–46.0)
Hemoglobin: 6.8 g/dL — CL (ref 12.0–15.0)
MCH: 33.2 pg (ref 26.0–34.0)
MCHC: 32.4 g/dL (ref 30.0–36.0)
MCV: 102.4 fL — ABNORMAL HIGH (ref 78.0–100.0)
PLATELETS: 186 10*3/uL (ref 150–400)
RBC: 2.05 MIL/uL — AB (ref 3.87–5.11)
RDW: 16.3 % — AB (ref 11.5–15.5)
WBC: 10.6 10*3/uL — AB (ref 4.0–10.5)

## 2015-09-25 LAB — PROTIME-INR
INR: 2.99 — AB (ref 0.00–1.49)
PROTHROMBIN TIME: 30.5 s — AB (ref 11.6–15.2)

## 2015-09-25 LAB — COMPREHENSIVE METABOLIC PANEL
ALT: 28 U/L (ref 14–54)
AST: 15 U/L (ref 15–41)
Albumin: 1.9 g/dL — ABNORMAL LOW (ref 3.5–5.0)
Alkaline Phosphatase: 121 U/L (ref 38–126)
Anion gap: 4 — ABNORMAL LOW (ref 5–15)
BUN: 30 mg/dL — ABNORMAL HIGH (ref 6–20)
CHLORIDE: 109 mmol/L (ref 101–111)
CO2: 27 mmol/L (ref 22–32)
CREATININE: 0.82 mg/dL (ref 0.44–1.00)
Calcium: 7.6 mg/dL — ABNORMAL LOW (ref 8.9–10.3)
Glucose, Bld: 120 mg/dL — ABNORMAL HIGH (ref 65–99)
POTASSIUM: 4.4 mmol/L (ref 3.5–5.1)
SODIUM: 140 mmol/L (ref 135–145)
Total Bilirubin: 0.5 mg/dL (ref 0.3–1.2)
Total Protein: 4.7 g/dL — ABNORMAL LOW (ref 6.5–8.1)

## 2015-09-25 LAB — BLOOD CULTURE ID PANEL (REFLEXED)
Acinetobacter baumannii: NOT DETECTED
CANDIDA ALBICANS: NOT DETECTED
CANDIDA GLABRATA: NOT DETECTED
CANDIDA KRUSEI: NOT DETECTED
CANDIDA TROPICALIS: NOT DETECTED
Candida parapsilosis: NOT DETECTED
Carbapenem resistance: NOT DETECTED
ENTEROBACTER CLOACAE COMPLEX: NOT DETECTED
ESCHERICHIA COLI: NOT DETECTED
Enterobacteriaceae species: NOT DETECTED
Enterococcus species: NOT DETECTED
HAEMOPHILUS INFLUENZAE: NOT DETECTED
KLEBSIELLA OXYTOCA: NOT DETECTED
KLEBSIELLA PNEUMONIAE: NOT DETECTED
LISTERIA MONOCYTOGENES: NOT DETECTED
METHICILLIN RESISTANCE: DETECTED — AB
Neisseria meningitidis: NOT DETECTED
PROTEUS SPECIES: NOT DETECTED
Pseudomonas aeruginosa: NOT DETECTED
SERRATIA MARCESCENS: NOT DETECTED
STREPTOCOCCUS PNEUMONIAE: NOT DETECTED
STREPTOCOCCUS PYOGENES: NOT DETECTED
Staphylococcus aureus (BCID): DETECTED — AB
Staphylococcus species: DETECTED — AB
Streptococcus agalactiae: NOT DETECTED
Streptococcus species: NOT DETECTED
Vancomycin resistance: NOT DETECTED

## 2015-09-25 LAB — PREPARE RBC (CROSSMATCH)

## 2015-09-25 LAB — URINE CULTURE: Culture: <10000 — AB

## 2015-09-25 LAB — ABO/RH: ABO/RH(D): B POS

## 2015-09-25 MED ORDER — MORPHINE SULFATE (PF) 2 MG/ML IV SOLN
1.0000 mg | INTRAVENOUS | Status: DC | PRN
  Administered 2015-09-25: 1 mg via INTRAVENOUS
  Administered 2015-09-25 – 2015-10-02 (×14): 2 mg via INTRAVENOUS
  Filled 2015-09-25 (×19): qty 1

## 2015-09-25 MED ORDER — SODIUM CHLORIDE 0.9 % IV SOLN
Freq: Once | INTRAVENOUS | Status: AC
  Administered 2015-09-25: 08:00:00 via INTRAVENOUS

## 2015-09-25 NOTE — Progress Notes (Signed)
Utilization review completed.  

## 2015-09-25 NOTE — Progress Notes (Signed)
ANTICOAGULATION CONSULT NOTE - Follow Up  Pharmacy Consult for Warfarin Indication: History extensive superficial vein thrombosis  Allergies  Allergen Reactions  . Aspirin Nausea And Vomiting  . Boost Pudding [Nutritional Supplements] Diarrhea  . Percodan [Oxycodone-Aspirin] Nausea Only  . Codeine Nausea Only    Patient Measurements: Height: 5\' 2"  (157.5 cm) Weight: 124 lb 3.2 oz (56.337 kg) IBW/kg (Calculated) : 50.1  Vital Signs: Temp: 97.5 F (36.4 C) (05/29 0432) Temp Source: Oral (05/29 0432) BP: 127/63 mmHg (05/29 0432) Pulse Rate: 54 (05/29 0432)  Labs:  Recent Labs  09/24/15 0945 09/25/15 0500  HGB  --  6.8*  HCT  --  21.0*  PLT  --  186  APTT 40*  --   LABPROT 22.2* 30.5*  INR 1.96* 2.99*  CREATININE  --  0.82    Estimated Creatinine Clearance: 49.8 mL/min (by C-G formula based on Cr of 0.82).   Medical History: Past Medical History  Diagnosis Date  . Cancer (HCC)     RBreast, Lower back , Under l Arm  . Hypertension   . GERD (gastroesophageal reflux disease)   . Breast cancer metastasized to bone ([place]) 04/30/2011  . Radiation 01/15/2012    Lumbosacral spine 3500 cGy 14  fx  . Swollen R ankle 08/10/2012  . Fracture of ankle, medial malleolus, right, closed 08/10/2012  . Tubular adenoma 2011  . Hiatal hernia 2011  . Schatzki's ring   . Anorexia 03/02/2013  . Weight loss due to medication 03/02/2013  . C. difficile colitis   . Pneumonia   . 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 antiseptic oral rinse  7 mL Mouth Rinse q12n4p  . chlorhexidine  15 mL Mouth Rinse BID  . dexamethasone  2 mg Oral Q12H  . docusate sodium  100 mg Oral BID  . feeding supplement  1 Container Oral BID BM  . [START ON 09/27/2015] fentaNYL  50 mcg Transdermal Q72H  . levofloxacin (LEVAQUIN) IV  750 mg Intravenous Q48H  . lidocaine  1 patch Transdermal Q24H  . pantoprazole  40 mg Oral Daily  . piperacillin-tazobactam (ZOSYN)  IV  3.375 g Intravenous  Q8H  . polyethylene glycol  17 g Oral Daily  . sodium chloride  1,000 mL Intravenous Once  . sodium chloride  1,000 mL Intravenous Once  . sodium chloride flush  3 mL Intravenous Q12H  . vancomycin  1,000 mg Intravenous Q24H   Infusions:  . sodium chloride 125 mL/hr at 09/25/15 0729   PRN: acetaminophen **OR** acetaminophen, magic mouthwash w/lidocaine, morphine, morphine injection, ondansetron **OR** ondansetron (ZOFRAN) IV, polyvinyl alcohol, sodium chloride flush  Assessment: 70 y.o. woman with a history of metastatic breast cancer, bone pain anticipating palliative radiation therapy, recently discharged 5/26 then readmitted 5/28 after leaving SNF AMA.  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 INR is essentially therapeutic on admission (1.96) and Pharmacy is consulted to continue dosing while inpatient.  Dose prior to 5/18 admission reported as 2mg  daily except 4mg  Fri, Sat, Sun Recent dosing history in Epic reviewed: doses ranged from 1mg  to 4mg  to achieve therapeutic INR  Today, 09/25/2015  INR therapeutic but rose considerably from 1.96 to 2.99 - 4mg  warfarin given 5/28  Hgb only 6.8  Potential drug interactions: Levaquin  No reported bleeding  Goal of Therapy:  INR 2-3   Plan:   Due to large jump in INR, no warfarin dose tonight  Daily PT/INR   Adrian Saran, PharmD, BCPS Pager [phone] 09/25/2015 9:17 AM

## 2015-09-25 NOTE — Progress Notes (Signed)
HGB of 6.8 paged to provider.

## 2015-09-25 NOTE — Evaluation (Signed)
Physical Therapy Evaluation Patient Details Name: [name] MRN: [id number] DOB: [birth year] Today's Date: 09/25/2015   History of Present Illness  70 yo female admitted with Pna from [facility] in [place], SIRS (recent adm with same--pt went to [facility] and left AMA). Hx of breast cancer with bony mets, FTT, HTN  Clinical Impression  Pt admitted with above diagnosis. Pt currently with functional limitations due to the deficits listed below (see PT Problem List).  Pt will benefit from skilled PT to increase their independence and safety with mobility to allow discharge to the venue listed below.   Pt should progress well, she is planning to return home with assist of her cousin's wife; will likely not need f/u PT at D/C; will continue to follow in acute setting     Follow Up Recommendations Home health PT;No PT follow up;Supervision - Intermittent (vs-pt left SNF AMA, plans 24hr care at home)    Equipment Recommendations  Rolling walker with 5" wheels    Recommendations for Other Services       Precautions / Restrictions Precautions Precautions: Fall Restrictions Weight Bearing Restrictions: No      Mobility  Bed Mobility Overal bed mobility: Needs Assistance     Sidelying to sit: Supervision       General bed mobility comments: HOB elevated, no physical assist  Transfers Overall transfer level: Needs assistance Equipment used: Rolling walker (2 wheeled) Transfers: Sit to/from Stand Sit to Stand: Min guard         General transfer comment: cues for safety  Ambulation/Gait Ambulation/Gait assistance: Min guard Ambulation Distance (Feet): 200 Feet Assistive device: Rolling walker (2 wheeled) Gait Pattern/deviations: Step-through pattern;Drifts right/left;Trunk flexed     General Gait Details: frequent verbal cues for RW position from self and overall safety  Stairs            Wheelchair Mobility    Modified Rankin (Stroke Patients  Only)       Balance Overall balance assessment: Needs assistance   Sitting balance-Leahy Scale: Good       Standing balance-Leahy Scale: Fair                               Pertinent Vitals/Pain Pain Assessment: 0-10 Pain Score: 10-Worst pain ever Pain Location: head and all over Pain Descriptors / Indicators: Aching Pain Intervention(s): Limited activity within patient's tolerance;Monitored during session    [place] expects to be discharged to:: Private residence Living Arrangements: Alone Available Help at Discharge: Family Type of Home: House Home Access: Stairs to enter   Technical brewer of Steps: 5 Home Layout: Able to live on main level with bedroom/bathroom;Two level;Laundry or work area in [employer]: None Additional Comments: pt cousin's wife to assist at home    Prior Function Level of Independence: Independent         Comments: 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     Journalist, newspaper        Extremity/Trunk Assessment   Upper Extremity Assessment: Overall WFL for tasks assessed           Lower Extremity Assessment: Overall WFL for tasks assessed         Communication   Communication: No difficulties  Cognition Arousal/Alertness: Awake/alert Behavior During Therapy: Flat affect Overall Cognitive Status: Within Functional Limits for tasks assessed  General Comments      Exercises        Assessment/Plan    PT Assessment Patient needs continued PT services  PT Diagnosis Difficulty walking   PT Problem List Decreased activity tolerance;Decreased mobility;Decreased knowledge of use of DME  PT Treatment Interventions DME instruction;Gait training;Functional mobility training;Therapeutic activities;Patient/family education;Balance training;Therapeutic exercise   PT Goals (Current goals can be found in the Care Plan section) Acute  Rehab PT Goals Patient Stated Goal: to go home and live PT Goal Formulation: With patient Time For Goal Achievement: 10/09/15 Potential to Achieve Goals: Good    Frequency Min 3X/week   Barriers to discharge        Co-evaluation               End of Session Equipment Utilized During Treatment: Gait belt Activity Tolerance: Patient tolerated treatment well Patient left: in chair;with call bell/phone within reach (no alarm needed per RN) Nurse Communication: Mobility status         Time: 1400-1436 PT Time Calculation (min) (ACUTE ONLY): 36 min   Charges:   PT Evaluation $PT Eval Low Complexity: 1 Procedure PT Treatments $Gait Training: 8-22 mins   PT G Codes:        Ryon Layton 10-08-2015, 4:17 PM

## 2015-09-25 NOTE — NC FL2 (Addendum)
[place] LEVEL OF CARE SCREENING TOOL     IDENTIFICATION  Patient Name: [name] Birthdate: 1945-10-15 Sex: female Admission Date (Current Location): 09/24/2015  [place] and [place] Number:   ([place], [place])   Facility and Address:  [facility],  [place] [address] [place]      Provider Number: [id number]  Attending Physician Name and Address:  Elmarie Shiley, MD  Relative Name and Phone Number:       Current Level of Care: Hospital Recommended Level of Care: [place] Prior Approval Number:    Date Approved/Denied:   PASRR Number: [id number] (out of state - expires 10/19/15)  Discharge Plan: Home    Current Diagnoses: Patient Active Problem List   Diagnosis Date Noted  . Acute septic pulmonary embolism ([place]) 09/24/2015  . Acute respiratory failure with hypoxia ([place]) 09/24/2015  . Leukocytosis 09/24/2015  . Bacteremia   . Rib pain on left side   . MRSA bacteremia   . Staphylococcus aureus bacteremia with sepsis ([place])   . Neoplasm related pain   . Encounter for palliative care   . Generalized abdominal pain   . Elevated LFTs   . SIRS (systemic inflammatory response syndrome) ([place]) 09/14/2015  . Pain from bone metastases ([place]) 06/28/2015  . Cancer of overlapping sites of right breast ([place]) 05/11/2015  . Bilateral breast cancer ([place]) 05/11/2015  . C. difficile colitis 02/08/2015  . Protein-calorie malnutrition, severe ([place]) 01/25/2015  . Clostridium difficile diarrhea   . Hypokalemia 01/24/2015  . AKI (acute kidney injury) ([place]) 01/24/2015  . Essential hypertension 01/24/2015  . Abdominal pain   . Dehydration 09/09/2014  . HCAP (healthcare-associated pneumonia) 08/09/2014  . Cavitating mass in left upper lung lobe 08/09/2014  . Anorexia 03/02/2013  . Weight loss due to medication 03/02/2013  . Swollen R ankle 08/10/2012  . Fracture of ankle, medial malleolus, right, closed 08/10/2012  . Rotator  cuff tear 09/24/2011  . Breast cancer metastasized to bone ([place]) 04/30/2011  . Rash 03/01/2011  . Seroma, post-traumatic 12/25/2010  . RECTAL BLEEDING 12/08/2009  . FLATULENCE 12/08/2009  . OTH NONSPC ABN FINDNG RAD&OTH EXM BODY STRUCTURE 01/10/2009  . GERD 12/26/2008  . Osteoarthritis 12/26/2008  . Nausea without vomiting 12/26/2008  . Diarrhea 12/26/2008  . Personal history of thrombophlebitis 12/26/2008  . COLONIC POLYPS, HX OF 12/26/2008    Orientation RESPIRATION BLADDER Height & Weight     Self, Situation, Time, Place  O2 (2L) Continent Weight: 124 lb 3.2 oz (56.337 kg) Height:  5\' 2"  (157.5 cm)  BEHAVIORAL SYMPTOMS/MOOD NEUROLOGICAL BOWEL NUTRITION STATUS      Continent Diet (regular)  AMBULATORY STATUS COMMUNICATION OF NEEDS Skin   Supervision  Verbally Normal                       Personal Care Assistance Level of Assistance  Bathing, Dressing Bathing Assistance: Limited assistance   Dressing Assistance: Limited assistance     Functional Limitations Info             SPECIAL CARE FACTORS FREQUENCY                   Contractures  none    Additional Factors Info  Code Status, Allergies  Isolation Precautions DNR Code Status Info: DNR Allergies Info: Aspirin, Boost Pudding, Percodan, Codeine  Isolation: METHICILLIN RESISTANT STAPHYLOCOCCUS AUREUS  (in blood) Port-A-Cath placement ID recommendation, plan for iv abx till 6/27-  Current Medications (09/25/2015):  This is the current hospital active medication list Current Facility-Administered Medications  Medication Dose Route Frequency Provider Last Rate Last Dose  . 0.9 %  sodium chloride infusion   Intravenous Continuous Lily Kocher, MD 125 mL/hr at 09/25/15 0729    . acetaminophen (TYLENOL) tablet 650 mg  650 mg Oral Q6H PRN Lily Kocher, MD       Or  . acetaminophen (TYLENOL) suppository 650 mg  650 mg Rectal Q6H PRN Lily Kocher, MD      . antiseptic oral rinse (CPC /  CETYLPYRIDINIUM CHLORIDE 0.05%) solution 7 mL  7 mL Mouth Rinse q12n4p Lily Kocher, MD   7 mL at 09/24/15 1600  . chlorhexidine (PERIDEX) 0.12 % solution 15 mL  15 mL Mouth Rinse BID Lily Kocher, MD   15 mL at 09/24/15 2133  . dexamethasone (DECADRON) tablet 2 mg  2 mg Oral Q12H Lily Kocher, MD   2 mg at 09/24/15 2133  . docusate sodium (COLACE) capsule 100 mg  100 mg Oral BID Lily Kocher, MD   100 mg at 09/24/15 0951  . feeding supplement (BOOST / RESOURCE BREEZE) liquid 1 Container  1 Container Oral BID BM Lily Kocher, MD   1 Container at 09/24/15 1242  . [START ON 09/27/2015] fentaNYL (DURAGESIC - dosed mcg/hr) 50 mcg  50 mcg Transdermal Q72H Lily Kocher, MD      . levofloxacin [facility]) IVPB 750 mg  750 mg Intravenous Q48H Emiliano Dyer, RPH      . lidocaine (LIDODERM) 5 % 1 patch  1 patch Transdermal Q24H Lily Kocher, MD   1 patch at 09/24/15 1047  . magic mouthwash w/lidocaine  10 mL Oral TID PRN Lily Kocher, MD      . morphine (MSIR) tablet 15 mg  15 mg Oral Q4H PRN Lily Kocher, MD   15 mg at 09/25/15 0603  . morphine 2 MG/ML injection 1-2 mg  1-2 mg Intravenous Q3H PRN Belkys A Regalado, MD   1 mg at 09/25/15 0813  . ondansetron (ZOFRAN) tablet 4 mg  4 mg Oral Q6H PRN Lily Kocher, MD       Or  . ondansetron [facility]) injection 4 mg  4 mg Intravenous Q6H PRN Lily Kocher, MD      . pantoprazole (PROTONIX) EC tablet 40 mg  40 mg Oral Daily Lily Kocher, MD   40 mg at 09/24/15 0951  . piperacillin-tazobactam (ZOSYN) IVPB 3.375 g  3.375 g Intravenous Q8H Emiliano Dyer, RPH   3.375 g at 09/25/15 0423  . polyethylene glycol (MIRALAX / GLYCOLAX) packet 17 g  17 g Oral Daily Lily Kocher, MD   17 g at 09/24/15 0953  . polyvinyl alcohol (LIQUIFILM TEARS) 1.4 % ophthalmic solution 1 drop  1 drop Both Eyes PRN Lily Kocher, MD      . sodium chloride 0.9 % bolus 1,000 mL  1,000 mL Intravenous Once Lily Kocher, MD      . sodium chloride 0.9 % bolus 1,000 mL  1,000 mL Intravenous Once  Lily Kocher, MD      . sodium chloride flush (NS) 0.9 % injection 10-40 mL  10-40 mL Intracatheter PRN Lily Kocher, MD   10 mL at 09/25/15 0641  . sodium chloride flush (NS) 0.9 % injection 3 mL  3 mL Intravenous Q12H Lily Kocher, MD   3 mL at 09/24/15 0954  . vancomycin (VANCOCIN) IVPB 1000 mg/200 mL premix  1,000 mg Intravenous Q24H Erin R  Maricela Bo, [facility]         Discharge Medications: Please see discharge summary for a list of discharge medications.  Relevant Imaging Results:  Relevant Lab Results:   Additional Information Patient has completed radiation and planning to NOT start Chemotherapy Has Porta-cath for IV antibiotics/access. LOG 5 days due to lack of insurance auth at time of DC SSN: [id number]  Standley Brooking, LCSW

## 2015-09-25 NOTE — Progress Notes (Signed)
PHARMACY - PHYSICIAN COMMUNICATION CRITICAL VALUE ALERT - BLOOD CULTURE IDENTIFICATION (BCID)  Results for orders placed or performed during the hospital encounter of 09/24/15  Blood Culture ID Panel (Reflexed) (Collected: 09/24/2015  9:45 AM)  Result Value Ref Range   Enterococcus species NOT DETECTED NOT DETECTED   Vancomycin resistance NOT DETECTED NOT DETECTED   Listeria monocytogenes NOT DETECTED NOT DETECTED   Staphylococcus species DETECTED (A) NOT DETECTED   Staphylococcus aureus DETECTED (A) NOT DETECTED   Methicillin resistance DETECTED (A) NOT DETECTED   Streptococcus species NOT DETECTED NOT DETECTED   Streptococcus agalactiae NOT DETECTED NOT DETECTED   Streptococcus pneumoniae NOT DETECTED NOT DETECTED   Streptococcus pyogenes NOT DETECTED NOT DETECTED   Acinetobacter baumannii NOT DETECTED NOT DETECTED   Enterobacteriaceae species NOT DETECTED NOT DETECTED   Enterobacter cloacae complex NOT DETECTED NOT DETECTED   Escherichia coli NOT DETECTED NOT DETECTED   Klebsiella oxytoca NOT DETECTED NOT DETECTED   Klebsiella pneumoniae NOT DETECTED NOT DETECTED   Proteus species NOT DETECTED NOT DETECTED   Serratia marcescens NOT DETECTED NOT DETECTED   Carbapenem resistance NOT DETECTED NOT DETECTED   Haemophilus influenzae NOT DETECTED NOT DETECTED   Neisseria meningitidis NOT DETECTED NOT DETECTED   Pseudomonas aeruginosa NOT DETECTED NOT DETECTED   Candida albicans NOT DETECTED NOT DETECTED   Candida glabrata NOT DETECTED NOT DETECTED   Candida krusei NOT DETECTED NOT DETECTED   Candida parapsilosis NOT DETECTED NOT DETECTED   Candida tropicalis NOT DETECTED NOT DETECTED    Name of physician (or Provider) Contacted: not needed, already on Vanc from previous admission for planned 4 weeks per ID from 5/23 >>   Changes to prescribed antibiotics required: none  Kara Mead 09/25/2015  9:09 AM

## 2015-09-25 NOTE — Consult Note (Signed)
Date of Admission:  09/24/2015  Date of Consult:  09/25/2015  Reason for Consult: Recurrent MRSA bacteremia with pneumonia +/- septic emboli to lung Referring Physician: "Terrilyn Saver auto consult" and Dr. Tyrell Antonio   HPI: [name] is an 70 y.o. 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 See my notes for further details re that admission. There hade been delay in diagnosis of MRSA bacteremia due to problems with the [facility] in [place] not having the cultures back in timely manner. She had been on vancomycin, cefepeme for PNA then cefepime prior to discovery of MRSA bacteremia. She was placed back on vancomycin and repeat blood cultures taken after she had been back on this abx for 2 days. TEE was clean. CT chest showed  Cavitary PNA c/w MRSA (+/- septic emboli) . PICC had been placed and she was dc to SNF but she did not fare well at SNF. She states she was not receiving her pain meds, nicotine patch, antibiotics and that condition of SNF was very poor and unsanitary. She left AMA and went home. She was taken home by a friend (without access to supplemental oxygen). She became profoundly weak and was found down.She states that she fell and struck her head but did not lose consciousness. She was seen at [facility] and transferred back to [facility]. Her blood cultures are yet again positive for MRSA.       Past Medical History  Diagnosis Date  . Cancer (HCC)     RBreast, Lower back , Under l Arm  . Hypertension   . GERD (gastroesophageal reflux disease)   . Breast cancer metastasized to bone ([place]) 04/30/2011  . Radiation 01/15/2012    Lumbosacral spine 3500 cGy 14  fx  . Swollen R ankle 08/10/2012  . Fracture of ankle, medial malleolus, right, closed 08/10/2012  . Tubular adenoma 2011  . Hiatal hernia 2011  . Schatzki's ring   . Anorexia 03/02/2013  . Weight loss due to medication  03/02/2013  . C. difficile colitis   . Pneumonia   . Metastasis ([place])     to skull    Past Surgical History  Procedure Laterality Date  . Gallbladder surgery  1991  . Mastectomy  06/28/10    bilateral- Dr. Rosebud Poles, [place]  . Inguinal hernia repair  90's    right  . Parathyroidectomy  90's  . Carpal tunnel release  90's  . Oophorectomy  90's    right  . Lymph node dissection    . Breast surgery    . Cholecystectomy    . Colonoscopy  01/08/10    Dr. [name]- anal tag,hemorrhoid o/w normal rectum, pancolonic diverticula, diminutive polyp in the base of the cecum= tubular adenoma on bx. poor prep  . Esophagogastroduodenoscopy  01/08/10    Dr. [name]- normal esophagus, small hiatal hernia, antum and body erosions, pedunculated polyp between D1 and D2, duodenal diverticulum, lymphangiectasia, second portion of the duodenum.stomach bx= inflammation, duodenum bx= adenoma  . Rotator cuff repair  08/27/11    Dr. Alphonzo Cruise- [facility]- [place] [facility]  . Esophagogastroduodenoscopy (egd) with propofol N/A 11/19/2012    RMR: non-critical Schatzki's ring s/p dilation with 59 F, multiple gastric polyps, duodenal polyp s/p piecemeal snare polypectomy, gastric polypectomy, path benign  . Maloney dilation N/A 11/19/2012    Procedure: Venia Minks DILATION;  Surgeon: Daneil Dolin, MD;  Location: [facility];  Service:  Endoscopy;  Laterality: N/A;  #54  . Polypectomy N/A 11/19/2012    Procedure: POLYPECTOMY;  Surgeon: Daneil Dolin, MD;  Location: [facility];  Service: Endoscopy;  Laterality: N/A;  duodenal  . Biopsy N/A 11/19/2012    Procedure: BIOPSY;  Surgeon: Daneil Dolin, MD;  Location: [facility];  Service: Endoscopy;  Laterality: N/A;  esophageal  . Tee without cardioversion N/A 09/21/2015    Procedure: TRANSESOPHAGEAL ECHOCARDIOGRAM (TEE);  Surgeon: Sanda Klein, MD;  Location: [facility] ENDOSCOPY;  Service: Cardiovascular;  Laterality: N/A;    Social History:  reports that she quit smoking about 32 years ago. She has never  used smokeless tobacco. She reports that she does not drink alcohol or use illicit drugs.   Family History  Problem Relation Age of Onset  . Cancer Mother     Colon  . Cancer Father     Lung, Mouth,leg    Allergies  Allergen Reactions  . Aspirin Nausea And Vomiting  . Boost Pudding [Nutritional Supplements] Diarrhea  . Percodan [Oxycodone-Aspirin] Nausea Only  . Codeine Nausea Only     Medications: I have reviewed patients current medications as documented in Epic Anti-infectives    Start     Dose/Rate Route Frequency Ordered Stop   09/25/15 2200  levofloxacin (LEVAQUIN) IVPB 750 mg     750 mg 100 mL/hr over 90 Minutes Intravenous Every 48 hours 09/24/15 1057     09/25/15 1000  vancomycin (VANCOCIN) IVPB 1000 mg/200 mL premix     1,000 mg 200 mL/hr over 60 Minutes Intravenous Every 24 hours 09/24/15 1055     09/25/15 0000  vancomycin (VANCOCIN) IVPB 750 mg/150 ml premix  Status:  Discontinued     750 mg 150 mL/hr over 60 Minutes Intravenous Every 12 hours 09/24/15 0823 09/24/15 1054   09/24/15 2000  piperacillin-tazobactam (ZOSYN) IVPB 3.375 g  Status:  Discontinued     3.375 g 12.5 mL/hr over 240 Minutes Intravenous Every 8 hours 09/24/15 1057 09/25/15 0941   09/24/15 1200  vancomycin (VANCOCIN) IVPB 1000 mg/200 mL premix  Status:  Discontinued     1,000 mg 200 mL/hr over 60 Minutes Intravenous  Once 09/24/15 1049 09/24/15 1056   09/24/15 1200  vancomycin (VANCOCIN) 1,250 mg in sodium chloride 0.9 % 250 mL IVPB     1,250 mg 166.7 mL/hr over 90 Minutes Intravenous  Once 09/24/15 1056 09/24/15 1412   09/24/15 1130  piperacillin-tazobactam (ZOSYN) IVPB 3.375 g     3.375 g 100 mL/hr over 30 Minutes Intravenous  Once 09/24/15 1052 09/24/15 1201         ROS:as in HPI otherwise remainder of 12 point Review of Systems is + for multiple complaints of pain in chronic sites, head, her overall care at the SNF. Otherwise negative.  Blood pressure 114/54, pulse 68, temperature  98.1 F (36.7 C), temperature source Oral, resp. rate 18, height 5' 2"  (1.575 m), weight 124 lb 3.2 oz (56.337 kg), SpO2 100 %. General: Alert and awake, oriented x3, not in any acute distress. HEENT: anicteric sclera,  EOMI, oropharynx clear and without exudate Cardiovascular: regular rate, normal r,  no murmur rubs or gallops Pulmonary: decreased BS at bases,  no wheezing, rales or rhonchi Gastrointestinal: soft nontender, nondistended, normal bowel sounds, Musculoskeletal: tender to palpation along rig cage Skin, soft tissue: no rashes, + ecchymoses Neuro: nonfocal, strength and sensation intact   Results for orders placed or performed during the hospital encounter of 09/24/15 (from the past 48 hour(s))  Lactic acid, plasma     Status: None   Collection Time: 09/24/15  8:45 AM  Result Value Ref Range   Lactic Acid, Venous 1.6 0.5 - 2.0 mmol/L  Protime-INR     Status: Abnormal   Collection Time: 09/24/15  9:45 AM  Result Value Ref Range   Prothrombin Time 22.2 (H) 11.6 - 15.2 seconds   INR 1.96 (H) 0.00 - 1.49  APTT     Status: Abnormal   Collection Time: 09/24/15  9:45 AM  Result Value Ref Range   aPTT 40 (H) 24 - 37 seconds    Comment:        IF BASELINE aPTT IS ELEVATED, SUGGEST PATIENT RISK ASSESSMENT BE USED TO DETERMINE APPROPRIATE ANTICOAGULANT THERAPY.   Culture, blood (single)     Status: None (Preliminary result)   Collection Time: 09/24/15  9:45 AM  Result Value Ref Range   Specimen Description BLOOD PICC  10 ML IN [facility] BOTTLE    Special Requests NONE    Culture  Setup Time      GRAM POSITIVE COCCI IN CLUSTERS ANAEROBIC BOTTLE ONLY CRITICAL RESULT CALLED TO, READ BACK BY AND VERIFIED WITH: Edson Snowball AT 2585 09/25/15 BY L BENFIELD Performed at [facility]    Culture PENDING    Report Status PENDING   Vancomycin, random     Status: None   Collection Time: 09/24/15  9:45 AM  Result Value Ref Range   Vancomycin Rm 8 ug/mL    Comment:          Random Vancomycin therapeutic range is dependent on dosage and time of specimen collection. A peak range is 20.0-40.0 ug/mL A trough range is 5.0-15.0 ug/mL          Blood Culture ID Panel (Reflexed)     Status: Abnormal   Collection Time: 09/24/15  9:45 AM  Result Value Ref Range   Enterococcus species NOT DETECTED NOT DETECTED   Vancomycin resistance NOT DETECTED NOT DETECTED   Listeria monocytogenes NOT DETECTED NOT DETECTED   Staphylococcus species DETECTED (A) NOT DETECTED    Comment: CRITICAL RESULT CALLED TO, READ BACK BY AND VERIFIED WITH: M FRENS,PHARMD AT 2778 09/25/15 BY L BENFIELD    Staphylococcus aureus DETECTED (A) NOT DETECTED    Comment: CRITICAL RESULT CALLED TO, READ BACK BY AND VERIFIED WITH: M FRENS,PHARMD AT 2423 09/25/15 BY L BENFIELD    Methicillin resistance DETECTED (A) NOT DETECTED    Comment: CRITICAL RESULT CALLED TO, READ BACK BY AND VERIFIED WITH: M FRENS,PHARMD AT 5361 09/25/15 BY L BENFIELD    Streptococcus species NOT DETECTED NOT DETECTED   Streptococcus agalactiae NOT DETECTED NOT DETECTED   Streptococcus pneumoniae NOT DETECTED NOT DETECTED   Streptococcus pyogenes NOT DETECTED NOT DETECTED   Acinetobacter baumannii NOT DETECTED NOT DETECTED   Enterobacteriaceae species NOT DETECTED NOT DETECTED   Enterobacter cloacae complex NOT DETECTED NOT DETECTED   Escherichia coli NOT DETECTED NOT DETECTED   Klebsiella oxytoca NOT DETECTED NOT DETECTED   Klebsiella pneumoniae NOT DETECTED NOT DETECTED   Proteus species NOT DETECTED NOT DETECTED   Serratia marcescens NOT DETECTED NOT DETECTED   Carbapenem resistance NOT DETECTED NOT DETECTED   Haemophilus influenzae NOT DETECTED NOT DETECTED   Neisseria meningitidis NOT DETECTED NOT DETECTED   Pseudomonas aeruginosa NOT DETECTED NOT DETECTED   Candida albicans NOT DETECTED NOT DETECTED   Candida glabrata NOT DETECTED NOT DETECTED   Candida krusei NOT DETECTED NOT DETECTED   Candida parapsilosis  NOT  DETECTED NOT DETECTED   Candida tropicalis NOT DETECTED NOT DETECTED    Comment: Performed at [facility]  Urinalysis, Routine w reflex microscopic (not at [facility])     Status: Abnormal   Collection Time: 09/24/15  2:55 PM  Result Value Ref Range   Color, Urine YELLOW YELLOW   APPearance CLEAR CLEAR   Specific Gravity, Urine 1.013 1.005 - 1.030   pH 5.0 5.0 - 8.0   Glucose, UA NEGATIVE NEGATIVE mg/dL   Hgb urine dipstick TRACE (A) NEGATIVE   Bilirubin Urine NEGATIVE NEGATIVE   Ketones, ur NEGATIVE NEGATIVE mg/dL   Protein, ur NEGATIVE NEGATIVE mg/dL   Nitrite NEGATIVE NEGATIVE   Leukocytes, UA NEGATIVE NEGATIVE  Urine microscopic-add on     Status: Abnormal   Collection Time: 09/24/15  2:55 PM  Result Value Ref Range   Squamous Epithelial / LPF 0-5 (A) NONE SEEN   WBC, UA 0-5 0 - 5 WBC/hpf   RBC / HPF 0-5 0 - 5 RBC/hpf   Bacteria, UA FEW (A) NONE SEEN   Urine-Other MUCOUS PRESENT   CBC     Status: Abnormal   Collection Time: 09/25/15  5:00 AM  Result Value Ref Range   WBC 10.6 (H) 4.0 - 10.5 K/uL   RBC 2.05 (L) 3.87 - 5.11 MIL/uL   Hemoglobin 6.8 (LL) 12.0 - 15.0 g/dL    Comment: CRITICAL RESULT CALLED TO, READ BACK BY AND VERIFIED WITH: WILFONG, F. RN @0711  ON 5.29.17 BY MCCOY,N.    HCT 21.0 (L) 36.0 - 46.0 %   MCV 102.4 (H) 78.0 - 100.0 fL   MCH 33.2 26.0 - 34.0 pg   MCHC 32.4 30.0 - 36.0 g/dL   RDW 16.3 (H) 11.5 - 15.5 %   Platelets 186 150 - 400 K/uL  Comprehensive metabolic panel     Status: Abnormal   Collection Time: 09/25/15  5:00 AM  Result Value Ref Range   Sodium 140 135 - 145 mmol/L   Potassium 4.4 3.5 - 5.1 mmol/L   Chloride 109 101 - 111 mmol/L   CO2 27 22 - 32 mmol/L   Glucose, Bld 120 (H) 65 - 99 mg/dL   BUN 30 (H) 6 - 20 mg/dL   Creatinine, Ser 0.82 0.44 - 1.00 mg/dL   Calcium 7.6 (L) 8.9 - 10.3 mg/dL   Total Protein 4.7 (L) 6.5 - 8.1 g/dL   Albumin 1.9 (L) 3.5 - 5.0 g/dL   AST 15 15 - 41 U/L   ALT 28 14 - 54 U/L   Alkaline Phosphatase 121  38 - 126 U/L   Total Bilirubin 0.5 0.3 - 1.2 mg/dL   GFR calc non Af Amer >60 >60 mL/min   GFR calc Af Amer >60 >60 mL/min    Comment: (NOTE) The eGFR has been calculated using the CKD EPI equation. This calculation has not been validated in all clinical situations. eGFR's persistently <60 mL/min signify possible Chronic Kidney Disease.    Anion gap 4 (L) 5 - 15  Protime-INR     Status: Abnormal   Collection Time: 09/25/15  5:00 AM  Result Value Ref Range   Prothrombin Time 30.5 (H) 11.6 - 15.2 seconds   INR 2.99 (H) 0.00 - 1.49  Prepare RBC     Status: None   Collection Time: 09/25/15  8:00 AM  Result Value Ref Range   Order Confirmation ORDER PROCESSED BY BLOOD BANK   ABO/Rh     Status: None  Collection Time: 09/25/15  8:00 AM  Result Value Ref Range   ABO/RH(D) B POS   Type and screen [place]     Status: None (Preliminary result)   Collection Time: 09/25/15  8:45 AM  Result Value Ref Range   ABO/RH(D) B POS    Antibody Screen NEG    Sample Expiration 09/28/2015    Unit Number [id number]    Blood Component Type RED CELLS,LR    Unit division 00    Status of Unit ISSUED    Transfusion Status OK TO TRANSFUSE    Crossmatch Result Compatible    Unit Number [id number]    Blood Component Type RED CELLS,LR    Unit division 00    Status of Unit ALLOCATED    Transfusion Status OK TO TRANSFUSE    Crossmatch Result Compatible    @BRIEFLABTABLE (sdes,specrequest,cult,reptstatus)   ) Recent Results (from the past 720 hour(s))  Culture, blood (routine x 2)     Status: None   Collection Time: 09/14/15  6:35 PM  Result Value Ref Range Status   Specimen Description BLOOD RIGHT ARM  Final   Special Requests BOTTLES DRAWN AEROBIC AND ANAEROBIC [place]  Final   Culture   Final    NO GROWTH 5 DAYS Performed at [facility]    Report Status 09/19/2015 FINAL  Final  Culture, blood (routine x 2)     Status: None   Collection Time: 09/14/15  6:40 PM    Result Value Ref Range Status   Specimen Description BLOOD LEFT HAND  Final   Special Requests BOTTLES DRAWN AEROBIC AND ANAEROBIC 5CC  Final   Culture   Final    NO GROWTH 5 DAYS Performed at [facility]    Report Status 09/19/2015 FINAL  Final  Culture, blood (Routine X 2) w Reflex to ID Panel     Status: None (Preliminary result)   Collection Time: 09/21/15  5:35 PM  Result Value Ref Range Status   Specimen Description BLOOD RIGHT ARM  Final   Special Requests BOTTLES DRAWN AEROBIC AND ANAEROBIC  5CC, 10CC  Final   Culture   Final    NO GROWTH 3 DAYS Performed at [facility]    Report Status PENDING  Incomplete  Culture, blood (Routine X 2) w Reflex to ID Panel     Status: None (Preliminary result)   Collection Time: 09/21/15  5:45 PM  Result Value Ref Range Status   Specimen Description BLOOD RIGHT ARM  Final   Special Requests IN PEDIATRIC BOTTLE  4CC  Final   Culture   Final    NO GROWTH 3 DAYS Performed at [facility]    Report Status PENDING  Incomplete  Culture, blood (single)     Status: None (Preliminary result)   Collection Time: 09/24/15  9:45 AM  Result Value Ref Range Status   Specimen Description BLOOD PICC  10 ML IN [facility] BOTTLE  Final   Special Requests NONE  Final   Culture  Setup Time   Final    GRAM POSITIVE COCCI IN CLUSTERS ANAEROBIC BOTTLE ONLY CRITICAL RESULT CALLED TO, READ BACK BY AND VERIFIED WITH: Jerilynn Mages FRENS,PHARMD AT 8676 09/25/15 BY L BENFIELD Performed at [facility]    Culture PENDING  Incomplete   Report Status PENDING  Incomplete  Blood Culture ID Panel (Reflexed)     Status: Abnormal   Collection Time: 09/24/15  9:45 AM  Result Value Ref Range Status  Enterococcus species NOT DETECTED NOT DETECTED Final   Vancomycin resistance NOT DETECTED NOT DETECTED Final   Listeria monocytogenes NOT DETECTED NOT DETECTED Final   Staphylococcus species DETECTED (A) NOT DETECTED Final    Comment: CRITICAL RESULT CALLED  TO, READ BACK BY AND VERIFIED WITH: M FRENS,PHARMD AT 2620 09/25/15 BY L BENFIELD    Staphylococcus aureus DETECTED (A) NOT DETECTED Final    Comment: CRITICAL RESULT CALLED TO, READ BACK BY AND VERIFIED WITH: M FRENS,PHARMD AT 3559 09/25/15 BY L BENFIELD    Methicillin resistance DETECTED (A) NOT DETECTED Final    Comment: CRITICAL RESULT CALLED TO, READ BACK BY AND VERIFIED WITH: Edson Snowball AT 7416 09/25/15 BY L BENFIELD    Streptococcus species NOT DETECTED NOT DETECTED Final   Streptococcus agalactiae NOT DETECTED NOT DETECTED Final   Streptococcus pneumoniae NOT DETECTED NOT DETECTED Final   Streptococcus pyogenes NOT DETECTED NOT DETECTED Final   Acinetobacter baumannii NOT DETECTED NOT DETECTED Final   Enterobacteriaceae species NOT DETECTED NOT DETECTED Final   Enterobacter cloacae complex NOT DETECTED NOT DETECTED Final   Escherichia coli NOT DETECTED NOT DETECTED Final   Klebsiella oxytoca NOT DETECTED NOT DETECTED Final   Klebsiella pneumoniae NOT DETECTED NOT DETECTED Final   Proteus species NOT DETECTED NOT DETECTED Final   Serratia marcescens NOT DETECTED NOT DETECTED Final   Carbapenem resistance NOT DETECTED NOT DETECTED Final   Haemophilus influenzae NOT DETECTED NOT DETECTED Final   Neisseria meningitidis NOT DETECTED NOT DETECTED Final   Pseudomonas aeruginosa NOT DETECTED NOT DETECTED Final   Candida albicans NOT DETECTED NOT DETECTED Final   Candida glabrata NOT DETECTED NOT DETECTED Final   Candida krusei NOT DETECTED NOT DETECTED Final   Candida parapsilosis NOT DETECTED NOT DETECTED Final   Candida tropicalis NOT DETECTED NOT DETECTED Final    Comment: Performed at [facility]     Impression/Recommendation  Principal Problem:   MRSA bacteremia Active Problems:   GERD   Personal history of thrombophlebitis   Breast cancer metastasized to bone ([place])   AKI (acute kidney injury) ([place])   Essential hypertension   Protein-calorie malnutrition,  severe ([place])   Pain from bone metastases (HCC)   Generalized abdominal pain   Elevated LFTs   Rib pain on left side   Acute septic pulmonary embolism (HCC)   Acute respiratory failure with hypoxia (HCC)   Leukocytosis   AIRICA SCHWARTZKOPF is a 70 y.o. female with  Metastatic breast cancer with recurrent MRSA bacteremia in setting of necrotizing pneumonia and PICC line with pause in effective treatment       [place] Antimicrobial Management Team Staphylococcus aureus bacteremia   Staphylococcus aureus bacteremia (SAB) is associated with a high rate of complications and mortality.  Specific aspects of clinical management are critical to optimizing the outcome of patients with SAB.  Therefore, the [facility] Antimicrobial Management Team [facility]) has initiated an intervention aimed at improving the management of SAB at [facility].  To do so, Infectious Diseases physicians are providing an evidence-based consult for the management of all patients with SAB.     Yes No Comments  Perform follow-up blood cultures (even if the patient is afebrile) to ensure clearance of bacteremia [x]  []    Remove vascular catheter and obtain follow-up blood cultures after the removal of the catheter []  []  DC PICC LINE AND DO NOT PLACE NEW ONE UNTIL REPEAT BLOOD CULTURES AFTER PULLING PICC ARE CLEAR AT 5 DAYS FINAL  Perform echocardiography  to evaluate for endocarditis (transthoracic ECHO is 40-50% sensitive, TEE is > 90% sensitive) []  []  Please keep in mind, that neither test can definitively EXCLUDE endocarditis, and that should clinical suspicion remain high for endocarditis the patient should then still be treated with an "endocarditis" duration of therapy = 6 weeks  sHE HAD TEE already last week no need to repeat  Consult electrophysiologist to evaluate implanted cardiac device (pacemaker, ICD) []  []  NA  Ensure source control []  []  Have all abscesses been drained effectively? Have deep seeded infections  (septic joints or osteomyelitis) had appropriate surgical debridement?  PICC line needs to come out  Investigate for "metastatic" sites of infection []  []  Does the patient have ANY symptom or physical exam finding that would suggest a deeper infection (back or neck pain that may be suggestive of vertebral osteomyelitis or epidural abscess, muscle pain that could be a symptom of pyomyositis)?  Keep in mind that for deep seeded infections MRI imaging with contrast is preferred rather than other often insensitive tests such as plain x-rays, especially early in a patient's presentation.  Change antibiotic therapy to   vancomycin []  []  Beta-lactam antibiotics are preferred for MSSA due to higher cure rates.   If on Vancomycin, goal trough should be 15 - 20 mcg/mL  Estimated duration of IV antibiotic therapy:  4 weeks AFTER PICC out and repeat blood cultures drawn []  []  Consult case management for probably prolonged outpatient IV antibiotic therapy   #2 Goals of Care: would ask for Palliative Care Consult. Coordination with Radiation Oncology needed since she was supposed to have XRT tomorrow. Not sure I would jump to do this given her necrotizing PNA and MRSA bacteremia but would discuss with her Radiation  Oncologist, Radiation Oncology here. Would ask Palliative Care to see as she is wanting to discuss "big picture" issues. Her Children are flying from [place] to be here later today.     09/25/2015, 1:33 PM   Thank you so much for this interesting consult  [place] for Mildred [phone] (pager) [phone] (office) 09/25/2015, 1:33 PM  Rhina [place] 09/25/2015, 1:33 PM

## 2015-09-25 NOTE — Progress Notes (Signed)
PROGRESS NOTE    [name]  BLT:[id number] DOB: 1946-04-14 DOA: 09/24/2015 PCP: Curlene Labrum, MD    Brief Narrative: [name] is a 70 y.o. woman with a history of metastatic breast cancer, bone pain anticipating palliative radiation therapy, severe protein calorie malnutrition with adult failure to thrive, HTN, and GERD who was just discharged from this hospital on 09/22/2015 and sent to SNF for management of MRSA bacteremia. The plan was for her to complete a 4 week course of IV vancomycin from 09/19/2015. She left SNF AMA at some point yesterday, reporting that she was not receiving pain medications or her antibiotics as ordered. She was taken home by a friend (without access to supplemental oxygen). She became profoundly weak and was found down. She insists that she did no lose consciousness; she was just too weak to get herself up. She complains of chronic pain at this point. She had left sided pleuritic chest pain that improved with oxygen. She reports that she was only SOB when she was off of oxygen. Again, no LOC. No evidence of acute blood loss. No nausea or vomiting ("I don't know why they gave me Zofran at the other hospital."). 1-2 loose stools daily.  ED Course: The patient was evaluated in the ED at [facility]. Pertinent findings include new leukocytosis compared to discharge and evidence of acute kidney injury. No documented fever. Alk phos remains elevated, but LFTs have otherwise improved. Protein levels remain low. CT chest interpreted as multifocal pneumonia; I suspect this represents the same process that was identified here by chest CT on Sep 20, 2015.   Assessment & Plan:   Principal Problem:   MRSA bacteremia Active Problems:   GERD   Personal history of thrombophlebitis   Breast cancer metastasized to bone ([place])   AKI (acute kidney injury) ([place])   Essential hypertension   Protein-calorie malnutrition, severe (HCC)   Pain from bone  metastases (HCC)   Generalized abdominal pain   Elevated LFTs   Rib pain on left side   Acute septic pulmonary embolism (HCC)   Acute respiratory failure with hypoxia (HCC)   Leukocytosis  MRSA bacteremia with HCAP and probable septic embolic to the lung with new leukocytosis compared to discharge --Resume vancomycin, levaquin, and zosyn for now per pharmacy consult --Repeat U/A, urine culture, and blood cultures  -blood culture growing staph again.  -discussed with Dr Tommy Medal, remove PICC line, discontinue zosyn and levaquin. Continue with vancomycin   History of metastatic breast cancer with bone pain: Recent PET scan on 08/31/15 showed numerous osseous mets-followed by Dr [name]. Recent MRI entire spine on 4/28 showed multiple osseous spinal mets with no cord compression. Plan for port placement and chemotherapy postponed with last admission due to acute illness.  Palliative radiation therapy anticipated for skull, left rib cage, and right tibial lesions. Will need to call Dr. [name] and Dr. Sondra Come tomorrow to see if patient is clear for that therapy in setting of bacteremia .  History of abnormal LFTs --Overall improved. No evidence of obvious liver mets on recent imaging. Continue to monitor.  Chronic pain --Fentanyl patch, MSIR prn (may need to change to dilaudid if renal failure persists)  GERD --Continue PPI  AKI with relative hypotension --Will aggressively hydrate and monitor for now --If renal function does not improve, will need further work-up --Pharmacy to renally dose medications  Acute on Chronic anemia --Hgb drop to 6,8. transfuse 2 units PRBC. Repeat labs in am.  Hold coumadin  tonight Denies melena.   Chronic anticoagulation for chronic, extensive superficial thrombophlebitis -hold coumadin tonight due to drop in Hb   History of hypertension but now showing relative hypotension --received IV fluids.  --Currently asymptomatic  Anorexia: Likely  secondary to underlying malignancy worsened by pneumonia.   Protein-calorie malnutrition, severe: Continue supplements    DVT prophylaxis: (Lovenox/Heparin/SCD's/anticoagulated/None (if comfort care) Code Status: (Full/Partial - specify details) Family Communication: (Specify name, relationship & date discussed. NO "discussed with patient") Disposition Plan: (specify when and where you expect patient to be discharged). Include barriers to DC in this tab.   Consultants:   Id    Procedures:   None   Antimicrobials:   Vancomycin    Subjective: Denies abdominal pain or back pain    Objective: Filed Vitals:   09/24/15 1448 09/24/15 1615 09/24/15 2119 09/25/15 0432  BP: 81/46 98/51 103/46 127/63  Pulse: 74 77 73 54  Temp: 98.2 F (36.8 C)  98.4 F (36.9 C) 97.5 F (36.4 C)  TempSrc: Oral  Oral Oral  Resp: 16  20 20   Height:      Weight:      SpO2: 99%  100% 100%    Intake/Output Summary (Last 24 hours) at 09/25/15 0751 Last data filed at 09/25/15 0600  Gross per 24 hour  Intake 2610.42 ml  Output      0 ml  Net 2610.42 ml   Filed Weights   09/24/15 0605  Weight: 56.337 kg (124 lb 3.2 oz)    Examination:  General exam: Appears calm and comfortable  Respiratory system: Clear to auscultation. Respiratory effort normal. Cardiovascular system: S1 & S2 heard, RRR. No JVD, murmurs, rubs, gallops or clicks. No pedal edema. Gastrointestinal system: Abdomen is nondistended, soft and nontender. No organomegaly or masses felt. Normal bowel sounds heard. Central nervous system: Alert and oriented. No focal neurological deficits. Extremities: Symmetric 5 x 5 power. Skin: No rashes, lesions or ulcers     Data Reviewed: I have personally reviewed following labs and imaging studies  CBC:  Recent Labs Lab 09/25/15 0500  WBC 10.6*  HGB 6.8*  HCT 21.0*  MCV 102.4*  PLT 734   Basic Metabolic Panel:  Recent Labs Lab 09/21/15 0505 09/25/15 0500  NA 138  140  K 3.9 4.4  CL 101 109  CO2 30 27  GLUCOSE 126* 120*  BUN 23* 30*  CREATININE 0.50 0.82  CALCIUM 8.3* 7.6*   GFR: Estimated Creatinine Clearance: 49.8 mL/min (by C-G formula based on Cr of 0.82). Liver Function Tests:  Recent Labs Lab 09/25/15 0500  AST 15  ALT 28  ALKPHOS 121  BILITOT 0.5  PROT 4.7*  ALBUMIN 1.9*   No results for input(s): LIPASE, AMYLASE in the last 168 hours. No results for input(s): AMMONIA in the last 168 hours. Coagulation Profile:  Recent Labs Lab 09/20/15 0539 09/21/15 0505 09/22/15 0507 09/24/15 0945 09/25/15 0500  INR 2.11* 2.04* 2.11* 1.96* 2.99*   Cardiac Enzymes: No results for input(s): CKTOTAL, CKMB, CKMBINDEX, TROPONINI in the last 168 hours. BNP (last 3 results) No results for input(s): PROBNP in the last 8760 hours. HbA1C: No results for input(s): HGBA1C in the last 72 hours. CBG: No results for input(s): GLUCAP in the last 168 hours. Lipid Profile: No results for input(s): CHOL, HDL, LDLCALC, TRIG, CHOLHDL, LDLDIRECT in the last 72 hours. Thyroid Function Tests: No results for input(s): TSH, T4TOTAL, FREET4, T3FREE, THYROIDAB in the last 72 hours. Anemia Panel: No results for input(s): VITAMINB12,  FOLATE, FERRITIN, TIBC, IRON, RETICCTPCT in the last 72 hours. Sepsis Labs:  Recent Labs Lab 09/24/15 0845  LATICACIDVEN 1.6    Recent Results (from the past 240 hour(s))  Culture, blood (Routine X 2) w Reflex to ID Panel     Status: None (Preliminary result)   Collection Time: 09/21/15  5:35 PM  Result Value Ref Range Status   Specimen Description BLOOD RIGHT ARM  Final   Special Requests BOTTLES DRAWN AEROBIC AND ANAEROBIC  5CC, 10CC  Final   Culture   Final    NO GROWTH 3 DAYS Performed at [facility]    Report Status PENDING  Incomplete  Culture, blood (Routine X 2) w Reflex to ID Panel     Status: None (Preliminary result)   Collection Time: 09/21/15  5:45 PM  Result Value Ref Range Status   Specimen  Description BLOOD RIGHT ARM  Final   Special Requests IN PEDIATRIC BOTTLE  4CC  Final   Culture   Final    NO GROWTH 3 DAYS Performed at [facility]    Report Status PENDING  Incomplete         Radiology Studies: Dg Chest 1 View  09/24/2015  CLINICAL DATA:  Position of PICC. EXAM: CHEST 1 VIEW COMPARISON:  Radiograph of Sep 15, 2015.  CT scan of Sep 20, 2015. FINDINGS: The heart size and mediastinal contours are within normal limits. Interval placement of right-sided PICC line, with distal tip in expected position of cavoatrial junction. No pneumothorax or significant pleural effusion is noted. Right axillary surgical clips are noted. Cavitary lesion is seen in the left upper lobe concerning for pulmonary abscess. Another cavitary lesion is seen in the right upper lobe. The visualized skeletal structures are unremarkable. IMPRESSION: Interval placement of right-sided PICC line, with distal tip in expected position of cavoatrial junction. Bilateral cavitary lesions are noted most consistent with cavitary pneumonia. Electronically Signed   By: Marijo Conception, M.D.   On: 09/24/2015 09:12        Scheduled Meds: . sodium chloride   Intravenous Once  . antiseptic oral rinse  7 mL Mouth Rinse q12n4p  . chlorhexidine  15 mL Mouth Rinse BID  . dexamethasone  2 mg Oral Q12H  . docusate sodium  100 mg Oral BID  . feeding supplement  1 Container Oral BID BM  . [START ON 09/27/2015] fentaNYL  50 mcg Transdermal Q72H  . levofloxacin (LEVAQUIN) IV  750 mg Intravenous Q48H  . lidocaine  1 patch Transdermal Q24H  . pantoprazole  40 mg Oral Daily  . piperacillin-tazobactam (ZOSYN)  IV  3.375 g Intravenous Q8H  . polyethylene glycol  17 g Oral Daily  . sodium chloride  1,000 mL Intravenous Once  . sodium chloride  1,000 mL Intravenous Once  . sodium chloride flush  3 mL Intravenous Q12H  . vancomycin  1,000 mg Intravenous Q24H   Continuous Infusions: . sodium chloride 125 mL/hr at  09/25/15 0729     LOS: 1 day    Time spent: 35 minutes.     Elmarie Shiley, MD [facility] Pager [phone]  If 7PM-7AM, please contact night-coverage [email or web address] Password [facility] 09/25/2015, 7:51 AM

## 2015-09-25 NOTE — Clinical Social Work Placement (Signed)
CSW spoke with patient about discharge plans - patient was discharged to [facility] on Friday, 5/26 and left AMA. Patient states that she plans to return home with her cousin's wife, Higinio Roger who will be coming into town tomorrow. CSW sent information out to [facility] as backup to home and CSW informed patient that only facility that offered a bed is [facility]. CSW will follow-up tomorrow re: discharge plan.    Raynaldo Opitz, [facility] Clinical Social Worker cell #: [phone]    CLINICAL SOCIAL WORK PLACEMENT  NOTE  Date:  09/25/2015  Patient Details  Name: [name] MRN: [id number] Date of Birth: 11/23/1945  Clinical Social Work is seeking post-discharge placement for this patient at the [place] level of care (*CSW will initial, date and re-position this form in  chart as items are completed):  Yes   Patient/family provided with [place] [facility] Work Department's list of facilities offering this level of care within the geographic area requested by the patient (or if unable, by the patient's family).  Yes   Patient/family informed of their freedom to choose among providers that offer the needed level of care, that participate in Medicare, Medicaid or managed care program needed by the patient, have an available bed and are willing to accept the patient.  Yes   Patient/family informed of 's ownership interest in [facility] and [facility], as well as of the fact that they are under no obligation to receive care at these facilities.  PASRR submitted to EDS on 09/25/15     PASRR number received on 09/25/15     Existing PASRR number confirmed on       FL2 transmitted to all facilities in geographic area requested by pt/family on 09/25/15     FL2 transmitted to all facilities within larger geographic area on       Patient informed that his/her managed care company has contracts  with or will negotiate with certain facilities, including the following:        Yes   Patient/family informed of bed offers received.  Patient chooses bed at       Physician recommends and patient chooses bed at      Patient to be transferred to   on  .  Patient to be transferred to facility by       Patient family notified on   of transfer.  Name of family member notified:        PHYSICIAN       Additional Comment:    _______________________________________________ Standley Brooking, LCSW 09/25/2015, 12:45 PM

## 2015-09-26 ENCOUNTER — Ambulatory Visit
Admit: 2015-09-26 | Discharge: 2015-09-26 | Disposition: A | Discharge status: Home / Self Care | Payer: Medicare PPO | Attending: Radiation Oncology | Admitting: Radiation Oncology

## 2015-09-26 DIAGNOSIS — C50919 Malignant neoplasm of unspecified site of unspecified female breast: Secondary | ICD-10-CM

## 2015-09-26 DIAGNOSIS — C7951 Secondary malignant neoplasm of bone: Principal | ICD-10-CM

## 2015-09-26 LAB — TYPE AND SCREEN
ABO/RH(D): B POS
Antibody Screen: NEGATIVE
UNIT DIVISION: 0
UNIT DIVISION: 0

## 2015-09-26 LAB — CULTURE, BLOOD (ROUTINE X 2)
CULTURE: NO GROWTH
Culture: NO GROWTH

## 2015-09-26 LAB — BASIC METABOLIC PANEL
ANION GAP: 5 (ref 5–15)
BUN: 21 mg/dL — ABNORMAL HIGH (ref 6–20)
CALCIUM: 8.6 mg/dL — AB (ref 8.9–10.3)
CHLORIDE: 106 mmol/L (ref 101–111)
CO2: 27 mmol/L (ref 22–32)
CREATININE: 0.54 mg/dL (ref 0.44–1.00)
GFR calc non Af Amer: >60 mL/min (ref >60)
Glucose, Bld: 101 mg/dL — ABNORMAL HIGH (ref 65–99)
Potassium: 4.3 mmol/L (ref 3.5–5.1)
SODIUM: 138 mmol/L (ref 135–145)

## 2015-09-26 LAB — CBC
HEMATOCRIT: 30.5 % — AB (ref 36.0–46.0)
HEMOGLOBIN: 10.2 g/dL — AB (ref 12.0–15.0)
MCH: 32.4 pg (ref 26.0–34.0)
MCHC: 33.4 g/dL (ref 30.0–36.0)
MCV: 96.8 fL (ref 78.0–100.0)
Platelets: 173 10*3/uL (ref 150–400)
RBC: 3.15 MIL/uL — ABNORMAL LOW (ref 3.87–5.11)
RDW: 21.6 % — AB (ref 11.5–15.5)
WBC: 11.2 10*3/uL — AB (ref 4.0–10.5)

## 2015-09-26 LAB — PROTIME-INR
INR: 2.93 — AB (ref 0.00–1.49)
PROTHROMBIN TIME: 30.1 s — AB (ref 11.6–15.2)

## 2015-09-26 MED ORDER — PRO-STAT SUGAR FREE PO LIQD
30.0000 mL | Freq: Three times a day (TID) | ORAL | Status: DC
  Administered 2015-09-26 – 2015-10-03 (×9): 30 mL via ORAL
  Filled 2015-09-26 (×17): qty 30

## 2015-09-26 MED ORDER — ZOLPIDEM TARTRATE 5 MG PO TABS
5.0000 mg | ORAL_TABLET | Freq: Once | ORAL | Status: AC
  Administered 2015-09-26: 5 mg via ORAL
  Filled 2015-09-26: qty 1

## 2015-09-26 NOTE — Progress Notes (Signed)
PROGRESS NOTE    [name]  CVU:[id number] DOB: 1945-07-22 DOA: 09/24/2015 PCP: Curlene Labrum, MD    Brief Narrative: [name] is a 70 y.o. woman with a history of metastatic breast cancer, bone pain anticipating palliative radiation therapy, severe protein calorie malnutrition with adult failure to thrive, HTN, and GERD who was just discharged from this hospital on 09/22/2015 and sent to SNF for management of MRSA bacteremia. The plan was for her to complete a 4 week course of IV vancomycin from 09/19/2015. She left SNF AMA at some point yesterday, reporting that she was not receiving pain medications or her antibiotics as ordered. She was taken home by a friend (without access to supplemental oxygen). She became profoundly weak and was found down. She insists that she did no lose consciousness; she was just too weak to get herself up. She complains of chronic pain at this point. She had left sided pleuritic chest pain that improved with oxygen. She reports that she was only SOB when she was off of oxygen. Again, no LOC. No evidence of acute blood loss. No nausea or vomiting ("I don't know why they gave me Zofran at the other hospital."). 1-2 loose stools daily.  ED Course: The patient was evaluated in the ED at [facility]. Pertinent findings include new leukocytosis compared to discharge and evidence of acute kidney injury. No documented fever. Alk phos remains elevated, but LFTs have otherwise improved. Protein levels remain low. CT chest interpreted as multifocal pneumonia; I suspect this represents the same process that was identified here by chest CT on Sep 20, 2015.   Assessment & Plan:   Principal Problem:   MRSA bacteremia Active Problems:   GERD   Personal history of thrombophlebitis   Breast cancer metastasized to bone ([place])   AKI (acute kidney injury) ([place])   Essential hypertension   Protein-calorie malnutrition, severe (H[facility])   Pain from bone  metastases ([facility])   Generalized abdominal pain   Elevated LFTs   Rib pain on left side   Acute septic pulmonary embolism ([facility])   Acute respiratory failure with hypoxia ([facility])   Leukocytosis   Necrotizing pneumonia ([place])   PICC line infection   Goals of care, counseling/discussion  MRSA bacteremia with HCAP and probable septic embolic to the lung with new leukocytosis compared to discharge --Resume vancomycin, levaquin, and zosyn for now per pharmacy consult --Repeat U/A, urine culture, and blood cultures  -blood culture growing staph again.  -discussed with Dr Tommy Medal, remove PICC line 5-30, discontinue zosyn and levaquin. Continue with vancomycin  Needs holiday line.   History of metastatic breast cancer with bone pain: Recent PET scan on 08/31/15 showed numerous osseous mets-followed by Dr [name]. Recent MRI entire spine on 4/28 showed multiple osseous spinal mets with no cord compression. Plan for port placement and chemotherapy postponed with last admission due to acute illness.  Palliative radiation therapy anticipated for skull, left rib cage, and right tibial lesions. Discussed with Dr [name] patient will received localized radiation, ok to proceed to prevents complication meningion carcinomatosis.   History of abnormal LFTs --Overall improved. No evidence of obvious liver mets on recent imaging. Continue to monitor.  Chronic pain --Fentanyl patch, MSIR prn (may need to change to dilaudid if renal failure persists)  GERD --Continue PPI  AKI with relative hypotension --Will aggressively hydrate and monitor for now --If renal function does not improve, will need further work-up --Pharmacy to renally dose medications  Acute on Chronic anemia --  Hgb drop to 6,8. Received   2 units PRBC 5-30. Hb increase to 10/  Hold coumadin tonight, repeat hb in am.  Denies melena.   Chronic anticoagulation for chronic, extensive superficial thrombophlebitis -hold coumadin tonight  due to drop in Hb initially. Repeat HB tomorrow to make sure hb remain stable.   History of hypertension but now showing relative hypotension --received IV fluids.  --Currently asymptomatic  Anorexia: Likely secondary to underlying malignancy worsened by pneumonia.   Protein-calorie malnutrition, severe: Continue supplements    DVT prophylaxis: on coumadin  Code Status: DNR Family Communication: care discussed with patient.  Disposition Plan: remain inpatient for treatment of Bacteremia   Consultants:   Id    Procedures:   None   Antimicrobials:   Vancomycin    Subjective: Ready to get radiation. Wants to go home at time of discharge/  Family comes tonight.   Objective: Filed Vitals:   09/25/15 1835 09/25/15 2040 09/25/15 2204 09/26/15 0600  BP: 129/67 134/59 121/67 120/53  Pulse: 61 61 61 57  Temp: 98.1 F (36.7 C) 98.1 F (36.7 C) 98.8 F (37.1 C) 98.2 F (36.8 C)  TempSrc: Oral Oral Oral Oral  Resp: _0 Height:      Weight:      SpO2: 100% 100% 99% 96%    Intake/Output Summary (Last 24 hours) at 09/26/15 1058 Last data filed at 09/26/15 0900  Gross per 24 hour  Intake 2787.91 ml  Output   4525 ml  Net -1737.09 ml   Filed Weights   09/24/15 0605  Weight: 56.337 kg (124 lb 3.2 oz)    Examination:  General exam: Appears calm and comfortable  Respiratory system: Clear to auscultation. Respiratory effort normal. Cardiovascular system: S1 & S2 heard, RRR. No JVD, murmurs, rubs, gallops or clicks. No pedal edema. Gastrointestinal system: Abdomen is nondistended, soft and nontender. No organomegaly or masses felt. Normal bowel sounds heard. Central nervous system: Alert and oriented. No focal neurological deficits. Extremities: Symmetric 5 x 5 power. Skin: No rashes, lesions or ulcers     Data Reviewed: I have personally reviewed following labs and imaging studies  CBC:  Recent Labs Lab 09/25/15 0500 09/26/15 0423  WBC 10.6*  11.2*  HGB 6.8* 10.2*  HCT 21.0* 30.5*  MCV 102.4* 96.8  PLT 186 161   Basic Metabolic Panel:  Recent Labs Lab 09/21/15 0505 09/25/15 0500 09/26/15 0423  NA 138 140 138  K 3.9 4.4 4.3  CL 101 109 106  CO2 _1 GLUCOSE 126* 120* 101*  BUN 23* 30* 21*  CREATININE 0.50 0.82 0.54  CALCIUM 8.3* 7.6* 8.6*   GFR: Estimated Creatinine Clearance: 51 mL/min (by C-G formula based on Cr of 0.54). Liver Function Tests:  Recent Labs Lab 09/25/15 0500  AST 15  ALT 28  ALKPHOS 121  BILITOT 0.5  PROT 4.7*  ALBUMIN 1.9*   No results for input(s): LIPASE, AMYLASE in the last 168 hours. No results for input(s): AMMONIA in the last 168 hours. Coagulation Profile:  Recent Labs Lab 09/21/15 0505 09/22/15 0507 09/24/15 0945 09/25/15 0500 09/26/15 0423  INR 2.04* 2.11* 1.96* 2.99* 2.93*   Cardiac Enzymes: No results for input(s): CKTOTAL, CKMB, CKMBINDEX, TROPONINI in the last 168 hours. BNP (last 3 results) No results for input(s): PROBNP in the last 8760 hours. HbA1C: No results for input(s): HGBA1C in the last 72 hours. CBG: No results for input(s): GLUCAP in the last 168 hours. Lipid Profile:  No results for input(s): CHOL, HDL, LDLCALC, TRIG, CHOLHDL, LDLDIRECT in the last 72 hours. Thyroid Function Tests: No results for input(s): TSH, T4TOTAL, FREET4, T3FREE, THYROIDAB in the last 72 hours. Anemia Panel: No results for input(s): VITAMINB12, FOLATE, FERRITIN, TIBC, IRON, RETICCTPCT in the last 72 hours. Sepsis Labs:  Recent Labs Lab 09/24/15 0845  LATICACIDVEN 1.6    Recent Results (from the past 240 hour(s))  Culture, blood (Routine X 2) w Reflex to ID Panel     Status: None (Preliminary result)   Collection Time: 09/21/15  5:35 PM  Result Value Ref Range Status   Specimen Description BLOOD RIGHT ARM  Final   Special Requests BOTTLES DRAWN AEROBIC AND ANAEROBIC  5CC, 10CC  Final   Culture   Final    NO GROWTH 4 DAYS Performed at [facility]     Report Status PENDING  Incomplete  Culture, blood (Routine X 2) w Reflex to ID Panel     Status: None (Preliminary result)   Collection Time: 09/21/15  5:45 PM  Result Value Ref Range Status   Specimen Description BLOOD RIGHT ARM  Final   Special Requests IN PEDIATRIC BOTTLE  4CC  Final   Culture   Final    NO GROWTH 4 DAYS Performed at [facility]    Report Status PENDING  Incomplete  Culture, blood (single)     Status: None (Preliminary result)   Collection Time: 09/24/15  9:20 AM  Result Value Ref Range Status   Specimen Description BLOOD LEFT ARM  10 ML IN [facility] BOTTLE  Final   Special Requests NONE  Final   Culture  Setup Time   Final    GRAM POSITIVE COCCI IN CLUSTERS ANAEROBIC BOTTLE ONLY CRITICAL RESULT CALLED TO, READ BACK BY AND VERIFIED WITH: Damian Leavell PHARMD AT 7939 09/25/15 L. BENFIELD Performed at [place]  Final   Report Status PENDING  Incomplete  Culture, blood (single)     Status: Abnormal (Preliminary result)   Collection Time: 09/24/15  9:45 AM  Result Value Ref Range Status   Specimen Description BLOOD PICC  10 ML IN [facility] BOTTLE  Final   Special Requests NONE  Final   Culture  Setup Time   Final    GRAM POSITIVE COCCI IN CLUSTERS ANAEROBIC BOTTLE ONLY CRITICAL RESULT CALLED TO, READ BACK BY AND VERIFIED WITH: Jerilynn Mages FRENS,PHARMD AT 0300 09/25/15 BY L BENFIELD Performed at [place] (A)  Final   Report Status PENDING  Incomplete  Blood Culture ID Panel (Reflexed)     Status: Abnormal   Collection Time: 09/24/15  9:45 AM  Result Value Ref Range Status   Enterococcus species NOT DETECTED NOT DETECTED Final   Vancomycin resistance NOT DETECTED NOT DETECTED Final   Listeria monocytogenes NOT DETECTED NOT DETECTED Final   Staphylococcus species DETECTED (A) NOT DETECTED Final    Comment: CRITICAL RESULT CALLED TO, READ BACK BY AND VERIFIED WITH: M FRENS,PHARMD AT 9233  09/25/15 BY L BENFIELD    Staphylococcus aureus DETECTED (A) NOT DETECTED Final    Comment: CRITICAL RESULT CALLED TO, READ BACK BY AND VERIFIED WITH: M FRENS,PHARMD AT 0076 09/25/15 BY L BENFIELD    Methicillin resistance DETECTED (A) NOT DETECTED Final    Comment: CRITICAL RESULT CALLED TO, READ BACK BY AND VERIFIED WITH: M FRENS,PHARMD AT 2263 09/25/15 BY L BENFIELD    Streptococcus species NOT  DETECTED NOT DETECTED Final   Streptococcus agalactiae NOT DETECTED NOT DETECTED Final   Streptococcus pneumoniae NOT DETECTED NOT DETECTED Final   Streptococcus pyogenes NOT DETECTED NOT DETECTED Final   Acinetobacter baumannii NOT DETECTED NOT DETECTED Final   Enterobacteriaceae species NOT DETECTED NOT DETECTED Final   Enterobacter cloacae complex NOT DETECTED NOT DETECTED Final   Escherichia coli NOT DETECTED NOT DETECTED Final   Klebsiella oxytoca NOT DETECTED NOT DETECTED Final   Klebsiella pneumoniae NOT DETECTED NOT DETECTED Final   Proteus species NOT DETECTED NOT DETECTED Final   Serratia marcescens NOT DETECTED NOT DETECTED Final   Carbapenem resistance NOT DETECTED NOT DETECTED Final   Haemophilus influenzae NOT DETECTED NOT DETECTED Final   Neisseria meningitidis NOT DETECTED NOT DETECTED Final   Pseudomonas aeruginosa NOT DETECTED NOT DETECTED Final   Candida albicans NOT DETECTED NOT DETECTED Final   Candida glabrata NOT DETECTED NOT DETECTED Final   Candida krusei NOT DETECTED NOT DETECTED Final   Candida parapsilosis NOT DETECTED NOT DETECTED Final   Candida tropicalis NOT DETECTED NOT DETECTED Final    Comment: Performed at [facility]  Culture, Urine     Status: Abnormal   Collection Time: 09/24/15  2:55 PM  Result Value Ref Range Status   Specimen Description URINE, RANDOM  Final   Special Requests NONE  Final   Culture (A)  Final    <10,000 COLONIES/mL INSIGNIFICANT GROWTH Performed at [facility]    Report Status 09/25/2015 FINAL  Final          Radiology Studies: No results found.      Scheduled Meds: . antiseptic oral rinse  7 mL Mouth Rinse q12n4p  . chlorhexidine  15 mL Mouth Rinse BID  . dexamethasone  2 mg Oral Q12H  . docusate sodium  100 mg Oral BID  . feeding supplement  1 Container Oral BID BM  . [START ON 09/27/2015] fentaNYL  50 mcg Transdermal Q72H  . levofloxacin (LEVAQUIN) IV  750 mg Intravenous Q48H  . lidocaine  1 patch Transdermal Q24H  . pantoprazole  40 mg Oral Daily  . polyethylene glycol  17 g Oral Daily  . sodium chloride  1,000 mL Intravenous Once  . sodium chloride  1,000 mL Intravenous Once  . sodium chloride flush  3 mL Intravenous Q12H  . vancomycin  1,000 mg Intravenous Q24H   Continuous Infusions: . sodium chloride 100 mL/hr at 09/25/15 2346     LOS: 2 days    Time spent: 35 minutes.     Elmarie Shiley, MD [facility] Pager [phone]  If 7PM-7AM, please contact night-coverage [email or web address] Password [email or web address] 09/26/2015, 10:58 AM

## 2015-09-26 NOTE — Progress Notes (Signed)
Subjective:  C/o why her blood has to be checked so often   Antibiotics:  Anti-infectives    Start     Dose/Rate Route Frequency Ordered Stop   09/25/15 2200  levofloxacin (LEVAQUIN) IVPB 750 mg  Status:  Discontinued     750 mg 100 mL/hr over 90 Minutes Intravenous Every 48 hours 09/24/15 1057 09/26/15 1228   09/25/15 1000  vancomycin (VANCOCIN) IVPB 1000 mg/200 mL premix     1,000 mg 200 mL/hr over 60 Minutes Intravenous Every 24 hours 09/24/15 1055     09/25/15 0000  vancomycin (VANCOCIN) IVPB 750 mg/150 ml premix  Status:  Discontinued     750 mg 150 mL/hr over 60 Minutes Intravenous Every 12 hours 09/24/15 0823 09/24/15 1054   09/24/15 2000  piperacillin-tazobactam (ZOSYN) IVPB 3.375 g  Status:  Discontinued     3.375 g 12.5 mL/hr over 240 Minutes Intravenous Every 8 hours 09/24/15 1057 09/25/15 0941   09/24/15 1200  vancomycin (VANCOCIN) IVPB 1000 mg/200 mL premix  Status:  Discontinued     1,000 mg 200 mL/hr over 60 Minutes Intravenous  Once 09/24/15 1049 09/24/15 1056   09/24/15 1200  vancomycin (VANCOCIN) 1,250 mg in sodium chloride 0.9 % 250 mL IVPB     1,250 mg 166.7 mL/hr over 90 Minutes Intravenous  Once 09/24/15 1056 09/24/15 1412   09/24/15 1130  piperacillin-tazobactam (ZOSYN) IVPB 3.375 g     3.375 g 100 mL/hr over 30 Minutes Intravenous  Once 09/24/15 1052 09/24/15 1201      Medications: Scheduled Meds: . antiseptic oral rinse  7 mL Mouth Rinse q12n4p  . chlorhexidine  15 mL Mouth Rinse BID  . dexamethasone  2 mg Oral Q12H  . docusate sodium  100 mg Oral BID  . feeding supplement (PRO-STAT SUGAR FREE 64)  30 mL Oral TID  . [START ON 09/27/2015] fentaNYL  50 mcg Transdermal Q72H  . lidocaine  1 patch Transdermal Q24H  . pantoprazole  40 mg Oral Daily  . polyethylene glycol  17 g Oral Daily  . sodium chloride  1,000 mL Intravenous Once  . sodium chloride  1,000 mL Intravenous Once  . sodium chloride flush  3 mL Intravenous Q12H  . vancomycin   1,000 mg Intravenous Q24H   Continuous Infusions: . sodium chloride 100 mL/hr at 09/25/15 2346   PRN Meds:.acetaminophen **OR** acetaminophen, magic mouthwash w/lidocaine, morphine, morphine injection, ondansetron **OR** ondansetron (ZOFRAN) IV, polyvinyl alcohol, sodium chloride flush    Objective: Weight change:   Intake/Output Summary (Last 24 hours) at 09/26/15 1707 Last data filed at 09/26/15 1600  Gross per 24 hour  Intake 1743.33 ml  Output   4250 ml  Net -2506.67 ml   Blood pressure 135/65, pulse 65, temperature 99 F (37.2 C), temperature source Oral, resp. rate 18, height 5\' 2"  (1.575 m), weight 124 lb 3.2 oz (56.337 kg), SpO2 96 %. Temp:  [97.9 F (36.6 C)-99 F (37.2 C)] 99 F (37.2 C) (05/30 1430) Pulse Rate:  [57-65] 65 (05/30 1430) Resp:  [18] 18 (05/30 1430) BP: (120-135)/(53-67) 135/65 mmHg (05/30 1430) SpO2:  [96 %-100 %] 96 % (05/30 1430)  Physical Exam: General: Alert and awake, oriented x3, not in any acute distress. HEENT: anicteric sclera, EOMI, oropharynx clear and without exudate Cardiovascular: regular rate, normal r, no murmur rubs or gallops Pulmonary: decreased BS at bases, no wheezing, rales or rhonchi Gastrointestinal: soft nontender, nondistended, normal bowel sounds, Musculoskeletal: tender to palpation  along rig cage Skin, soft tissue: no rashes, + ecchymoses, right PICC line site bandaged Neuro: nonfocal, strength and sensation intact  CBC:   CBC Latest Ref Rng 09/26/2015 09/25/2015 09/17/2015  WBC 4.0 - 10.5 K/uL 11.2(H) 10.6(H) 7.4  Hemoglobin 12.0 - 15.0 g/dL 10.2(L) 6.8(LL) 9.1(L)  Hematocrit 36.0 - 46.0 % 30.5(L) 21.0(L) 27.6(L)  Platelets 150 - 400 K/uL 173 186 148(L)      BMET  Recent Labs  09/25/15 0500 09/26/15 0423  NA 140 138  K 4.4 4.3  CL 109 106  CO2 27 27  GLUCOSE 120* 101*  BUN 30* 21*  CREATININE 0.82 0.54  CALCIUM 7.6* 8.6*     Liver Panel   Recent Labs  09/25/15 0500  PROT 4.7*  ALBUMIN  1.9*  AST 15  ALT 28  ALKPHOS 121  BILITOT 0.5       Sedimentation Rate No results for input(s): ESRSEDRATE in the last 72 hours. C-Reactive Protein No results for input(s): CRP in the last 72 hours.  Micro Results: Recent Results (from the past 720 hour(s))  Culture, blood (routine x 2)     Status: None   Collection Time: 09/14/15  6:35 PM  Result Value Ref Range Status   Specimen Description BLOOD RIGHT ARM  Final   Special Requests BOTTLES DRAWN AEROBIC AND ANAEROBIC [place]  Final   Culture   Final    NO GROWTH 5 DAYS Performed at [facility]    Report Status 09/19/2015 FINAL  Final  Culture, blood (routine x 2)     Status: None   Collection Time: 09/14/15  6:40 PM  Result Value Ref Range Status   Specimen Description BLOOD LEFT HAND  Final   Special Requests BOTTLES DRAWN AEROBIC AND ANAEROBIC 5CC  Final   Culture   Final    NO GROWTH 5 DAYS Performed at [facility]    Report Status 09/19/2015 FINAL  Final  Culture, blood (Routine X 2) w Reflex to ID Panel     Status: None   Collection Time: 09/21/15  5:35 PM  Result Value Ref Range Status   Specimen Description BLOOD RIGHT ARM  Final   Special Requests BOTTLES DRAWN AEROBIC AND ANAEROBIC  5CC, 10CC  Final   Culture   Final    NO GROWTH 5 DAYS Performed at [facility]    Report Status 09/26/2015 FINAL  Final  Culture, blood (Routine X 2) w Reflex to ID Panel     Status: None   Collection Time: 09/21/15  5:45 PM  Result Value Ref Range Status   Specimen Description BLOOD RIGHT ARM  Final   Special Requests IN PEDIATRIC BOTTLE  4CC  Final   Culture   Final    NO GROWTH 5 DAYS Performed at [facility]    Report Status 09/26/2015 FINAL  Final  Culture, blood (single)     Status: None (Preliminary result)   Collection Time: 09/24/15  9:20 AM  Result Value Ref Range Status   Specimen Description BLOOD LEFT ARM  10 ML IN [facility] BOTTLE  Final   Special Requests NONE  Final   Culture   Setup Time   Final    GRAM POSITIVE COCCI IN CLUSTERS ANAEROBIC BOTTLE ONLY CRITICAL RESULT CALLED TO, READ BACK BY AND VERIFIED WITH: Damian Leavell PHARMD AT [id number] 09/25/15 L. BENFIELD Performed at [place]  Final   Report Status PENDING  Incomplete  Culture, blood (single)     Status: Abnormal (Preliminary result)   Collection Time: 09/24/15  9:45 AM  Result Value Ref Range Status   Specimen Description BLOOD PICC  10 ML IN [facility] BOTTLE  Final   Special Requests NONE  Final   Culture  Setup Time   Final    GRAM POSITIVE COCCI IN CLUSTERS ANAEROBIC BOTTLE ONLY CRITICAL RESULT CALLED TO, READ BACK BY AND VERIFIED WITH: Jerilynn Mages FRENS,PHARMD AT [id number] 09/25/15 BY L BENFIELD Performed at [place] (A)  Final   Report Status PENDING  Incomplete  Blood Culture ID Panel (Reflexed)     Status: Abnormal   Collection Time: 09/24/15  9:45 AM  Result Value Ref Range Status   Enterococcus species NOT DETECTED NOT DETECTED Final   Vancomycin resistance NOT DETECTED NOT DETECTED Final   Listeria monocytogenes NOT DETECTED NOT DETECTED Final   Staphylococcus species DETECTED (A) NOT DETECTED Final    Comment: CRITICAL RESULT CALLED TO, READ BACK BY AND VERIFIED WITH: M FRENS,PHARMD AT [id number] 09/25/15 BY L BENFIELD    Staphylococcus aureus DETECTED (A) NOT DETECTED Final    Comment: CRITICAL RESULT CALLED TO, READ BACK BY AND VERIFIED WITH: M FRENS,PHARMD AT [id number] 09/25/15 BY L BENFIELD    Methicillin resistance DETECTED (A) NOT DETECTED Final    Comment: CRITICAL RESULT CALLED TO, READ BACK BY AND VERIFIED WITH: Edson Snowball AT [id number] 09/25/15 BY L BENFIELD    Streptococcus species NOT DETECTED NOT DETECTED Final   Streptococcus agalactiae NOT DETECTED NOT DETECTED Final   Streptococcus pneumoniae NOT DETECTED NOT DETECTED Final   Streptococcus pyogenes NOT DETECTED NOT DETECTED Final   Acinetobacter baumannii NOT DETECTED  NOT DETECTED Final   Enterobacteriaceae species NOT DETECTED NOT DETECTED Final   Enterobacter cloacae complex NOT DETECTED NOT DETECTED Final   Escherichia coli NOT DETECTED NOT DETECTED Final   Klebsiella oxytoca NOT DETECTED NOT DETECTED Final   Klebsiella pneumoniae NOT DETECTED NOT DETECTED Final   Proteus species NOT DETECTED NOT DETECTED Final   Serratia marcescens NOT DETECTED NOT DETECTED Final   Carbapenem resistance NOT DETECTED NOT DETECTED Final   Haemophilus influenzae NOT DETECTED NOT DETECTED Final   Neisseria meningitidis NOT DETECTED NOT DETECTED Final   Pseudomonas aeruginosa NOT DETECTED NOT DETECTED Final   Candida albicans NOT DETECTED NOT DETECTED Final   Candida glabrata NOT DETECTED NOT DETECTED Final   Candida krusei NOT DETECTED NOT DETECTED Final   Candida parapsilosis NOT DETECTED NOT DETECTED Final   Candida tropicalis NOT DETECTED NOT DETECTED Final    Comment: Performed at [facility]  Culture, Urine     Status: Abnormal   Collection Time: 09/24/15  2:55 PM  Result Value Ref Range Status   Specimen Description URINE, RANDOM  Final   Special Requests NONE  Final   Culture (A)  Final    <10,000 COLONIES/mL INSIGNIFICANT GROWTH Performed at [facility]    Report Status 09/25/2015 FINAL  Final    Studies/Results: No results found.    Assessment/Plan:  INTERVAL HISTORY:  09/26/15: PICC removed yesterday , cultures obtained prior to PICC removal persistently positive   Principal Problem:   MRSA bacteremia Active Problems:   GERD   Personal history of thrombophlebitis   Breast cancer metastasized to bone ([place])   AKI (acute kidney injury) ([place])   Essential hypertension   Protein-calorie malnutrition, severe ([place])   Pain from bone metastases ([place])  Generalized abdominal pain   Elevated LFTs   Rib pain on left side   Acute septic pulmonary embolism (HCC)   Acute respiratory failure with hypoxia (HCC)   Leukocytosis    Necrotizing pneumonia (HCC)   PICC line infection   Goals of care, counseling/discussion    [name] is a 70 y.o. 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 Specific aspects of clinical management are critical to optimizing the outcome of patients with SAB. Therefore, the [facility] Antimicrobial Management Team [facility]) has initiated an intervention aimed at improving the management of SAB at [facility] To do so, Infectious Diseases physicians are providing an evidence-based consult for the management of all patients with SAB.     Yes No Comments  Perform follow-up blood cultures (even if the patient is afebrile) to ensure clearance of bacteremia [X]  [ ]  REPEATING AGAIN TODAY AS THEY WERE STILL POSITIVE YESTERDAY  Remove vascular catheter and obtain follow-up blood cultures after the removal of the catheter [ ]  [ ]  DC PICC LINE AND DO NOT PLACE NEW ONE UNTIL REPEAT BLOOD CULTURES FROM TODAY ARE CLEAR AT 5 DAYS FINAL  WITH RE TO PORT PLACEMENT I WOULD BE IN FAVOR OF STARTING WITH PICC ONCE HER BACTEREMIA HAS CLEARED AND USING IT UNTIL WE ARE SURE WE HAVE CURED HER MRSA BACTEREMIA. I WOULD HATE TO HAVE HER HAVE YET ANOTHER RECURRENCE OF HER BACTEREMIA AND HAVE TO HAVE THE PORT REMOVED    Perform echocardiography to evaluate for endocarditis (transthoracic ECHO is 40-50% sensitive, TEE is > 90% sensitive) [ ]  [ ]  Please keep in mind, that neither test can definitively EXCLUDE endocarditis, and that should clinical suspicion remain high for endocarditis the patient should then still be treated with an "endocarditis" duration of therapy = 6  weeks  sHE HAD TEE already last week no need to repeat  Consult electrophysiologist to evaluate implanted cardiac device (pacemaker, ICD) [ ]  [ ]  NA  Ensure source control [ ]  [ ]  Have all abscesses been drained effectively? Have deep seeded infections (septic joints or osteomyelitis) had appropriate surgical debridement?  PICC line IS OUT AND NOW WE NEED TO CLEAR HER BLOOD  Investigate for "metastatic" sites of infection [ ]  [ ]  Does the patient have ANY symptom or physical exam finding that would suggest a deeper infection (back or neck pain that may be suggestive of vertebral osteomyelitis or epidural abscess, muscle pain that could be a symptom of pyomyositis)?  Keep in mind that for deep seeded infections MRI imaging with contrast is preferred rather than other often insensitive tests such as plain x-rays, especially early in a patient's presentation.  Change antibiotic therapy to   vancomycin [ ]  [ ]  Beta-lactam antibiotics are preferred for MSSA due to higher cure rates.  If on Vancomycin, goal trough should be 15 - 20 mcg/mL  Estimated duration of IV antibiotic therapy: 4 weeks AFTER PICC out and repeat blood cultures ARE NO GROWTH [ ]  [ ]  Consult case management for probably prolonged outpatient IV antibiotic therapy          LOS: 2 days   Alcide Evener 09/26/2015, 5:07 PM

## 2015-09-26 NOTE — Progress Notes (Signed)
Initial Nutrition Assessment  DOCUMENTATION CODES:   Severe malnutrition in context of chronic illness  INTERVENTION:   -D/c Boost Breeze per patient request -Provide Prostat liquid protein PO 30 ml TID with meals, each supplement provides 100 kcal, 15 grams protein. -RD to continue to monitor  NUTRITION DIAGNOSIS:   Malnutrition related to chronic illness as evidenced by percent weight loss, severe depletion of body fat, severe depletion of muscle mass.  GOAL:   Patient will meet greater than or equal to 90% of their needs  MONITOR:   PO intake, Supplement acceptance, Labs, Weight trends, I & O's  REASON FOR ASSESSMENT:   Malnutrition Screening Tool    ASSESSMENT:   70 y.o. woman with a history of metastatic breast cancer, bone pain anticipating palliative radiation therapy, severe protein calorie malnutrition with adult failure to thrive, HTN, and GERD who was just discharged from this hospital on 09/22/2015 and sent to SNF for management of MRSA bacteremia.  Patient in room with MD at bedside. Per patient, her appetite has been poor for some time. Pt has experienced some taste changes, "nothing tastes right anymore". She denies any swallowing or chewing issues recently. Pt will continue to receive radiation treatments per MD. Pt states not liking Boost supplements as she states they cause diarrhea. Pt is willing to try Prostat supplements, RD to order. Pt currently consuming 50% of meals on regular diet, she states she eats just because she has to. Per weight history, pt has lost 7 lb since 5/18 (5% wt loss x 1.5 weeks, significant for time frame). Nutrition-Focused physical exam completed. Findings are severe fat depletion, moderate-severe muscle depletion, and severe edema.   Labs reviewed. Medications: Colace BID  Diet Order:  Diet regular Room service appropriate?: Yes; Fluid consistency:: Thin  Skin:  Reviewed, no issues  Last BM:  5/27  Height:   Ht Readings  from Last 1 Encounters:  09/24/15 5\' 2"  (1.575 m)    Weight:   Wt Readings from Last 1 Encounters:  09/24/15 124 lb 3.2 oz (56.337 kg)    Ideal Body Weight:  50 kg  BMI:  Body mass index is 22.71 kg/(m^2).  Estimated Nutritional Needs:   Kcal:  1700-1900  Protein:  85-95g  Fluid:  1.9L/day  EDUCATION NEEDS:   No education needs identified at this time  Clayton Bibles, MS, RD, LDN Pager: [phone] After Hours Pager: [phone]

## 2015-09-26 NOTE — Progress Notes (Signed)
ANTICOAGULATION CONSULT NOTE - Follow Up  Pharmacy Consult for Warfarin Indication: History extensive superficial vein thrombosis  Allergies  Allergen Reactions  . Aspirin Nausea And Vomiting  . Boost Pudding [Nutritional Supplements] Diarrhea  . Percodan [Oxycodone-Aspirin] Nausea Only  . Codeine Nausea Only    Patient Measurements: Height: 5\' 2"  (157.5 cm) Weight: 124 lb 3.2 oz (56.337 kg) IBW/kg (Calculated) : 50.1  Vital Signs: Temp: 98.2 F (36.8 C) (05/30 0600) Temp Source: Oral (05/30 0600) BP: 120/53 mmHg (05/30 0600) Pulse Rate: 57 (05/30 0600)  Labs:  Recent Labs  09/24/15 0945 09/25/15 0500 09/26/15 0423  HGB  --  6.8* 10.2*  HCT  --  21.0* 30.5*  PLT  --  186 173  APTT 40*  --   --   LABPROT 22.2* 30.5* 30.1*  INR 1.96* 2.99* 2.93*  CREATININE  --  0.82 0.54    Estimated Creatinine Clearance: 51 mL/min (by C-G formula based on Cr of 0.54).   Medical History: Past Medical History  Diagnosis Date  . Cancer (HCC)     RBreast, Lower back , Under l Arm  . Hypertension   . GERD (gastroesophageal reflux disease)   . Breast cancer metastasized to bone ([place]) 04/30/2011  . Radiation 01/15/2012    Lumbosacral spine 3500 cGy 14  fx  . Swollen R ankle 08/10/2012  . Fracture of ankle, medial malleolus, right, closed 08/10/2012  . Tubular adenoma 2011  . Hiatal hernia 2011  . Schatzki's ring   . Anorexia 03/02/2013  . Weight loss due to medication 03/02/2013  . C. difficile colitis   . Pneumonia   . 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 antiseptic oral rinse  7 mL Mouth Rinse q12n4p  . chlorhexidine  15 mL Mouth Rinse BID  . dexamethasone  2 mg Oral Q12H  . docusate sodium  100 mg Oral BID  . feeding supplement (PRO-STAT SUGAR FREE 64)  30 mL Oral TID  . [START ON 09/27/2015] fentaNYL  50 mcg Transdermal Q72H  . lidocaine  1 patch Transdermal Q24H  . pantoprazole  40 mg Oral Daily  . polyethylene glycol  17 g Oral Daily  .  sodium chloride  1,000 mL Intravenous Once  . sodium chloride  1,000 mL Intravenous Once  . sodium chloride flush  3 mL Intravenous Q12H  . vancomycin  1,000 mg Intravenous Q24H   Infusions:  . sodium chloride 100 mL/hr at 09/25/15 2346   PRN: acetaminophen **OR** acetaminophen, magic mouthwash w/lidocaine, morphine, morphine injection, ondansetron **OR** ondansetron (ZOFRAN) IV, polyvinyl alcohol, sodium chloride flush  Assessment: 70 y.o. woman with a history of metastatic breast cancer, bone pain anticipating palliative radiation therapy, recently discharged 5/26 then readmitted 5/28 after leaving SNF AMA.  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 INR is essentially therapeutic on admission (1.96) and Pharmacy is consulted to continue dosing while inpatient.  Dose prior to 5/18 admission reported as 2mg  daily except 4mg  Fri, Sat, Sun Recent dosing history in Epic reviewed: doses ranged from 1mg  to 4mg  to achieve therapeutic INR  Today, 09/26/2015  INR therapeutic with little change after dose held 5/29.  Hgb improved  No drug interactions  No reported bleeding  Goal of Therapy:  INR 2-3   Plan:   Continue to hold warfarin dose tonight as d/w Dr Tyrell Antonio  Daily PT/INR  F/U with attending MD re: when to resume warfarin per Dr  Regalado request   Netta Cedars, PharmD, BCPS Pager: [phone] 09/26/2015 12:31 PM

## 2015-09-26 NOTE — Progress Notes (Signed)
  Radiation Oncology         [phone] ________________________________  Name: [name] MRN: [id number]  Date: 09/26/2015  DOB: 1945-10-16  Simulation Verification Note    ICD-9-CM ICD-10-CM   1. Breast cancer metastasized to bone, unspecified laterality (HCC) 174.9 C50.919    198.5 C79.51     Status: inpatient  NARRATIVE: The patient was brought to the treatment unit and placed in the planned treatment position. The clinical setup was verified. Then port films were obtained and uploaded to the radiation oncology medical record software.  The treatment beams were carefully compared against the planned radiation fields. The position location and shape of the radiation fields was reviewed. They targeted volume of tissue appears to be appropriately covered by the radiation beams. Organs at risk appear to be excluded as planned.  Based on my personal review, I approved the simulation verification. The patient's treatment will proceed as planned.  -----------------------------------  Blair Promise, PhD, MD

## 2015-09-27 ENCOUNTER — Ambulatory Visit
Admit: 2015-09-27 | Discharge: 2015-09-27 | Disposition: A | Discharge status: Home / Self Care | Payer: Medicare PPO | Attending: Radiation Oncology | Admitting: Radiation Oncology

## 2015-09-27 ENCOUNTER — Inpatient Hospital Stay (HOSPITAL_COMMUNITY): Payer: Medicare PPO

## 2015-09-27 ENCOUNTER — Ambulatory Visit: Payer: Medicare PPO | Attending: Radiation Oncology

## 2015-09-27 DIAGNOSIS — R609 Edema, unspecified: Secondary | ICD-10-CM

## 2015-09-27 DIAGNOSIS — J189 Pneumonia, unspecified organism: Principal | ICD-10-CM

## 2015-09-27 DIAGNOSIS — R7881 Bacteremia: Secondary | ICD-10-CM

## 2015-09-27 DIAGNOSIS — J984 Other disorders of lung: Secondary | ICD-10-CM

## 2015-09-27 DIAGNOSIS — R1084 Generalized abdominal pain: Secondary | ICD-10-CM

## 2015-09-27 DIAGNOSIS — C44501 Unspecified malignant neoplasm of skin of breast: Secondary | ICD-10-CM

## 2015-09-27 DIAGNOSIS — B9562 Methicillin resistant Staphylococcus aureus infection as the cause of diseases classified elsewhere: Secondary | ICD-10-CM

## 2015-09-27 LAB — CBC
HEMATOCRIT: 30 % — AB (ref 36.0–46.0)
HEMOGLOBIN: 10 g/dL — AB (ref 12.0–15.0)
MCH: 32.3 pg (ref 26.0–34.0)
MCHC: 33.3 g/dL (ref 30.0–36.0)
MCV: 96.8 fL (ref 78.0–100.0)
Platelets: 181 10*3/uL (ref 150–400)
RBC: 3.1 MIL/uL — AB (ref 3.87–5.11)
RDW: 20.5 % — ABNORMAL HIGH (ref 11.5–15.5)
WBC: 9.7 10*3/uL (ref 4.0–10.5)

## 2015-09-27 LAB — PROTIME-INR
INR: 1.86 — AB (ref 0.00–1.49)
Prothrombin Time: 21.3 seconds — ABNORMAL HIGH (ref 11.6–15.2)

## 2015-09-27 LAB — CULTURE, BLOOD (SINGLE)

## 2015-09-27 LAB — BASIC METABOLIC PANEL
ANION GAP: 9 (ref 5–15)
BUN: 20 mg/dL (ref 6–20)
CO2: 26 mmol/L (ref 22–32)
Calcium: 8.1 mg/dL — ABNORMAL LOW (ref 8.9–10.3)
Chloride: 104 mmol/L (ref 101–111)
Creatinine, Ser: 0.62 mg/dL (ref 0.44–1.00)
GFR calc non Af Amer: >60 mL/min (ref >60)
GLUCOSE: 114 mg/dL — AB (ref 65–99)
POTASSIUM: 3.5 mmol/L (ref 3.5–5.1)
Sodium: 139 mmol/L (ref 135–145)

## 2015-09-27 MED ORDER — GABAPENTIN 100 MG PO CAPS
100.0000 mg | ORAL_CAPSULE | Freq: Every day | ORAL | Status: DC
  Administered 2015-09-27 – 2015-10-03 (×7): 100 mg via ORAL
  Filled 2015-09-27 (×7): qty 1

## 2015-09-27 MED ORDER — WARFARIN - PHARMACIST DOSING INPATIENT: Freq: Every day | Status: DC

## 2015-09-27 MED ORDER — WARFARIN SODIUM 4 MG PO TABS
4.0000 mg | ORAL_TABLET | Freq: Once | ORAL | Status: AC
  Administered 2015-09-27: 4 mg via ORAL
  Filled 2015-09-27: qty 1

## 2015-09-27 MED ORDER — FUROSEMIDE 10 MG/ML IJ SOLN
40.0000 mg | Freq: Once | INTRAMUSCULAR | Status: AC
  Administered 2015-09-27: 40 mg via INTRAVENOUS
  Filled 2015-09-27: qty 4

## 2015-09-27 MED ORDER — POTASSIUM CHLORIDE CRYS ER 20 MEQ PO TBCR
40.0000 meq | EXTENDED_RELEASE_TABLET | Freq: Once | ORAL | Status: AC
  Administered 2015-09-27: 40 meq via ORAL
  Filled 2015-09-27: qty 2

## 2015-09-27 NOTE — Progress Notes (Addendum)
VASCULAR LAB PRELIMINARY  PRELIMINARY  PRELIMINARY  PRELIMINARY  Bilateral lower extremity venous duplex completed.    Preliminary report:  Bilateral:  No evidence of DVT or superficial thrombosis. There is a area of mixed echoes in the popliteal fossa consistent with a possible Baker 's cyst on the left.    Carlena Ruybal, RVS 09/27/2015, 3:14 PM

## 2015-09-27 NOTE — Progress Notes (Signed)
ANTICOAGULATION CONSULT NOTE - Follow Up  Pharmacy Consult for Warfarin Indication: History extensive superficial vein thrombosis  Allergies  Allergen Reactions  . Aspirin Nausea And Vomiting  . Boost Pudding [Nutritional Supplements] Diarrhea  . Percodan [Oxycodone-Aspirin] Nausea Only  . Codeine Nausea Only    Patient Measurements: Height: 5\' 2"  (157.5 cm) Weight: 124 lb 3.2 oz (56.337 kg) IBW/kg (Calculated) : 50.1  Vital Signs: Temp: 97.9 F (36.6 C) (05/31 0445) Temp Source: Oral (05/31 0445) BP: 151/65 mmHg (05/31 0445) Pulse Rate: 82 (05/31 0445)  Labs:  Recent Labs  09/25/15 0500 09/26/15 0423 09/27/15 0432  HGB 6.8* 10.2* 10.0*  HCT 21.0* 30.5* 30.0*  PLT 186 173 181  LABPROT 30.5* 30.1* 21.3*  INR 2.99* 2.93* 1.86*  CREATININE 0.82 0.54 0.62    Estimated Creatinine Clearance: 51 mL/min (by C-G formula based on Cr of 0.62).   Medical History: Past Medical History  Diagnosis Date  . Cancer (HCC)     RBreast, Lower back , Under l Arm  . Hypertension   . GERD (gastroesophageal reflux disease)   . Breast cancer metastasized to bone ([place]) 04/30/2011  . Radiation 01/15/2012    Lumbosacral spine 3500 cGy 14  fx  . Swollen R ankle 08/10/2012  . Fracture of ankle, medial malleolus, right, closed 08/10/2012  . Tubular adenoma 2011  . Hiatal hernia 2011  . Schatzki's ring   . Anorexia 03/02/2013  . Weight loss due to medication 03/02/2013  . C. difficile colitis   . Pneumonia   . 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 antiseptic oral rinse  7 mL Mouth Rinse q12n4p  . chlorhexidine  15 mL Mouth Rinse BID  . dexamethasone  2 mg Oral Q12H  . docusate sodium  100 mg Oral BID  . feeding supplement (PRO-STAT SUGAR FREE 64)  30 mL Oral TID  . fentaNYL  50 mcg Transdermal Q72H  . lidocaine  1 patch Transdermal Q24H  . pantoprazole  40 mg Oral Daily  . polyethylene glycol  17 g Oral Daily  . sodium chloride  1,000 mL Intravenous Once   . sodium chloride  1,000 mL Intravenous Once  . sodium chloride flush  3 mL Intravenous Q12H  . vancomycin  1,000 mg Intravenous Q24H   Infusions:  . sodium chloride 100 mL/hr at 09/27/15 0825   PRN: acetaminophen **OR** acetaminophen, magic mouthwash w/lidocaine, morphine, morphine injection, ondansetron **OR** ondansetron (ZOFRAN) IV, polyvinyl alcohol, sodium chloride flush  Assessment: 70 y.o. woman with a history of metastatic breast cancer, bone pain anticipating palliative radiation therapy, recently discharged 5/26 then readmitted 5/28 after leaving SNF AMA.  She is on chronic warfarin therapy for hx extensive superficial vein thrombosis. INR is essentially therapeutic on admission (1.96) and Pharmacy is consulted to continue dosing while inpatient.  Dose prior to 5/18 admission reported as 2mg  daily except 4mg  Fri, Sat, Sun Recent dosing history in Epic reviewed: doses ranged from 1mg  to 4mg  to achieve therapeutic INR  Today, 09/27/2015  Noted drop in hemoglobin on 5/29. Hgb improved after 2 units PRBCs 5/30, stable overnight, Plts WNL  Unclear reason for decrease in Hgb.  No bleeding noted per RN.    INR now subtherapeutic after 2 days missed doses.  Ok to resume warfarin today per Dr. Erlinda Hong with conservative dosing.   Eating 100% meals  No drug interactions  SCDs charted  Goal of Therapy:  INR 2-3   Plan:  Warfarin 4mg  po x1 tonight  Daily PT/INR  Ralene Bathe, PharmD, BCPS 09/27/2015, 12:43 PM  Pager: [phone]

## 2015-09-27 NOTE — Progress Notes (Signed)
PROGRESS NOTE    [name]  RZN:[id number] DOB: Mar 11, 1946 DOA: 09/24/2015 PCP: Curlene Labrum, MD    Brief Narrative: [name] is a 70 y.o. woman with a history of metastatic breast cancer, bone pain anticipating palliative radiation therapy, severe protein calorie malnutrition with adult failure to thrive, HTN, and GERD who was just discharged from this hospital on 09/22/2015 and sent to SNF for management of MRSA bacteremia. The plan was for her to complete a 4 week course of IV vancomycin from 09/19/2015. She left SNF AMA at some point yesterday, reporting that she was not receiving pain medications or her antibiotics as ordered. She was taken home by a friend (without access to supplemental oxygen). She became profoundly weak and was found down. She insists that she did no lose consciousness; she was just too weak to get herself up. She complains of chronic pain at this point. She had left sided pleuritic chest pain that improved with oxygen. She reports that she was only SOB when she was off of oxygen. Again, no LOC. No evidence of acute blood loss. No nausea or vomiting ("I don't know why they gave me Zofran at the other hospital."). 1-2 loose stools daily.  ED Course: The patient was evaluated in the ED at [facility]. Pertinent findings include new leukocytosis compared to discharge and evidence of acute kidney injury. No documented fever. Alk phos remains elevated, but LFTs have otherwise improved. Protein levels remain low. CT chest interpreted as multifocal pneumonia; I suspect this represents the same process that was identified here by chest CT on Sep 20, 2015.   Assessment & Plan:   Principal Problem:   MRSA bacteremia Active Problems:   GERD   Personal history of thrombophlebitis   Breast cancer metastasized to bone (HCC)   AKI (acute kidney injury) ([place])   Essential hypertension   Protein-calorie malnutrition, severe (HCC)   Pain from bone  metastases (HCC)   Generalized abdominal pain   Elevated LFTs   Rib pain on left side   Acute septic pulmonary embolism (HCC)   Acute respiratory failure with hypoxia (HCC)   Leukocytosis   Necrotizing pneumonia ([place])   PICC line infection   Goals of care, counseling/discussion  MRSA bacteremia with HCAP and probable septic embolic to the lung with new leukocytosis compared to discharge -- urine culture insignificant growth, blood cultures from 5/28 +MRSA 2/2, repeat blood culture from 5/30, no growth continue  -picc line placed from previously hospitalization removed on 5/30 -continue Vancomycin which was started from admission, she received levaquin, and zosyn initially on admission -TEE no vegetation on 5/25 from previous hospitalization, will discuss with id the need of repeat tee, duration of treatment and possible port placement for anticipated chemotherapy? And long term abx treatment  -appreciate ID input  History of metastatic breast cancer with bone pain: Recent PET scan on 08/31/15 showed numerous osseous mets-followed by Dr [name]. Recent MRI entire spine on 4/28 showed multiple osseous spinal mets with no cord compression. Plan for port placement and chemotherapy postponed with last admission due to acute illness.  Palliative radiation therapy anticipated for skull, left rib cage, and right tibial lesions. Discussed with Dr [name] patient will received localized radiation, ok to proceed to prevents complication meningion carcinomatosis.  pateint to received total of 5 days xrt, Last xrt on 6/5 per rad onc.  History of abnormal LFTs --Overall improved. No evidence of obvious liver mets on recent imaging. Continue to monitor.  Chronic pain --  Fentanyl patch, MSIR prn (may need to change to dilaudid if renal failure persists)  GERD --Continue PPI  AKI with relative hypotension --Will aggressively hydrate and monitor for now --If renal function does not improve, will  need further work-up --Pharmacy to renally dose medications  Acute on Chronic anemia --Hgb drop to 6,8 0n 5/29. Received   2 units PRBC 5-30. Hb increase to 10/  Hold coumadin held on 5/29 and 5/30, hgb has been stable, coumadin resumed on 5/32.  Stool guaiac pending collection.  Chronic anticoagulation for chronic, extensive superficial thrombophlebitis coumadin held on 5/29 and 5/30, due to drop of hemoglobin on 5/29.  hgb stabilized after prbc transfusion, no overt sign of bleed, coumadin resumed on 5/31 target on lower end of therapeutic range, monitor hgb, inr, Patient does has bilateral lower extremity edema, venous US no dvt, trial of lasix on 5/31  History of hypertension but now showing relative hypotension --s/p IV fluids.  --Currently asymptomatic  Anorexia: Likely secondary to underlying malignancy worsened by pneumonia.   Protein-calorie malnutrition, severe: Continue supplements    DVT prophylaxis: on coumadin  Code Status: DNR Family Communication: care discussed with patient and her cousin in the room Disposition Plan: remain inpatient for treatment of Bacteremia   Consultants:   ID  Oncology  Rad onc   Procedures:   Removal of picc line on 5/30  Antimicrobials:   Vancomycin    Subjective: Sitting in chair, c/o bilateral leg pain/feet pain, cousin in the room.  Objective: Filed Vitals:   09/26/15 0600 09/26/15 1430 09/26/15 2127 09/27/15 0445  BP: 120/53 135/65 119/59 151/65  Pulse: 57 65 77 82  Temp: 98.2 F (36.8 C) 99 F (37.2 C) 98.3 F (36.8 C) 97.9 F (36.6 C)  TempSrc: Oral Oral Oral Oral  Resp: 18 18 18 18   Height:      Weight:      SpO2: 96% 96% 95% 98%    Intake/Output Summary (Last 24 hours) at 09/27/15 1340 Last data filed at 09/27/15 1220  Gross per 24 hour  Intake    802 ml  Output   2925 ml  Net  -2123 ml   Filed Weights   09/24/15 0605  Weight: 56.337 kg (124 lb 3.2 oz)    Examination:  General exam:  Appears calm and comfortable  Respiratory system: Clear to auscultation. Respiratory effort normal. Cardiovascular system: S1 & S2 heard, RRR. No JVD, murmurs, rubs, gallops or clicks. 1-2+ pedal edema. Gastrointestinal system: Abdomen is nondistended, soft and nontender. No organomegaly or masses felt. Normal bowel sounds heard. Central nervous system: Alert and oriented. No focal neurological deficits. Extremities: Symmetric 5 x 5 power.  Skin: No rashes, lesions or ulcers     Data Reviewed: I have personally reviewed following labs and imaging studies  CBC:  Recent Labs Lab 09/25/15 0500 09/26/15 0423 09/27/15 0432  WBC 10.6* 11.2* 9.7  HGB 6.8* 10.2* 10.0*  HCT 21.0* 30.5* 30.0*  MCV 102.4* 96.8 96.8  PLT 186 173 130   Basic Metabolic Panel:  Recent Labs Lab 09/21/15 0505 09/25/15 0500 09/26/15 0423 09/27/15 0432  NA 138 140 138 139  K 3.9 4.4 4.3 3.5  CL 101 109 106 104  CO2 30 27 27 26   GLUCOSE 126* 120* 101* 114*  BUN 23* 30* 21* 20  CREATININE 0.50 0.82 0.54 0.62  CALCIUM 8.3* 7.6* 8.6* 8.1*   GFR: Estimated Creatinine Clearance: 51 mL/min (by C-G formula based on Cr of 0.62). Liver Function  Tests:  Recent Labs Lab 09/25/15 0500  AST 15  ALT 28  ALKPHOS 121  BILITOT 0.5  PROT 4.7*  ALBUMIN 1.9*   No results for input(s): LIPASE, AMYLASE in the last 168 hours. No results for input(s): AMMONIA in the last 168 hours. Coagulation Profile:  Recent Labs Lab 09/22/15 0507 09/24/15 0945 09/25/15 0500 09/26/15 0423 09/27/15 0432  INR 2.11* 1.96* 2.99* 2.93* 1.86*   Cardiac Enzymes: No results for input(s): CKTOTAL, CKMB, CKMBINDEX, TROPONINI in the last 168 hours. BNP (last 3 results) No results for input(s): PROBNP in the last 8760 hours. HbA1C: No results for input(s): HGBA1C in the last 72 hours. CBG: No results for input(s): GLUCAP in the last 168 hours. Lipid Profile: No results for input(s): CHOL, HDL, LDLCALC, TRIG, CHOLHDL,  LDLDIRECT in the last 72 hours. Thyroid Function Tests: No results for input(s): TSH, T4TOTAL, FREET4, T3FREE, THYROIDAB in the last 72 hours. Anemia Panel: No results for input(s): VITAMINB12, FOLATE, FERRITIN, TIBC, IRON, RETICCTPCT in the last 72 hours. Sepsis Labs:  Recent Labs Lab 09/24/15 0845  LATICACIDVEN 1.6    Recent Results (from the past 240 hour(s))  Culture, blood (Routine X 2) w Reflex to ID Panel     Status: None   Collection Time: 09/21/15  5:35 PM  Result Value Ref Range Status   Specimen Description BLOOD RIGHT ARM  Final   Special Requests BOTTLES DRAWN AEROBIC AND ANAEROBIC  5CC, 10CC  Final   Culture   Final    NO GROWTH 5 DAYS Performed at [facility]    Report Status 09/26/2015 FINAL  Final  Culture, blood (Routine X 2) w Reflex to ID Panel     Status: None   Collection Time: 09/21/15  5:45 PM  Result Value Ref Range Status   Specimen Description BLOOD RIGHT ARM  Final   Special Requests IN PEDIATRIC BOTTLE  4CC  Final   Culture   Final    NO GROWTH 5 DAYS Performed at [facility]    Report Status 09/26/2015 FINAL  Final  Culture, blood (single)     Status: Abnormal   Collection Time: 09/24/15  9:20 AM  Result Value Ref Range Status   Specimen Description BLOOD LEFT ARM  10 ML IN [facility] BOTTLE  Final   Special Requests NONE  Final   Culture  Setup Time   Final    GRAM POSITIVE COCCI IN CLUSTERS ANAEROBIC BOTTLE ONLY CRITICAL RESULT CALLED TO, READ BACK BY AND VERIFIED WITH: Damian Leavell PHARMD AT 8756 09/25/15 L. BENFIELD    Culture (A)  Final    STAPHYLOCOCCUS AUREUS SUSCEPTIBILITIES PERFORMED ON PREVIOUS CULTURE WITHIN THE LAST 5 DAYS. Performed at [facility]    Report Status 09/27/2015 FINAL  Final  Culture, blood (single)     Status: Abnormal   Collection Time: 09/24/15  9:45 AM  Result Value Ref Range Status   Specimen Description BLOOD PICC  10 ML IN [facility] BOTTLE  Final   Special Requests NONE  Final   Culture   Setup Time   Final    GRAM POSITIVE COCCI IN CLUSTERS ANAEROBIC BOTTLE ONLY CRITICAL RESULT CALLED TO, READ BACK BY AND VERIFIED WITH: Jerilynn Mages FRENS,PHARMD AT 4332 09/25/15 BY L BENFIELD Performed at [place] (A)  Final   Report Status 09/27/2015 FINAL  Final   Organism ID, Bacteria METHICILLIN RESISTANT STAPHYLOCOCCUS AUREUS  Final  Susceptibility   Methicillin resistant staphylococcus aureus - MIC*    CIPROFLOXACIN >=8 RESISTANT Resistant     ERYTHROMYCIN >=8 RESISTANT Resistant     GENTAMICIN <=0.5 SENSITIVE Sensitive     OXACILLIN >=4 RESISTANT Resistant     TETRACYCLINE <=1 SENSITIVE Sensitive     VANCOMYCIN 1 SENSITIVE Sensitive     TRIMETH/SULFA <=10 SENSITIVE Sensitive     CLINDAMYCIN <=0.25 SENSITIVE Sensitive     RIFAMPIN <=0.5 SENSITIVE Sensitive     Inducible Clindamycin NEGATIVE Sensitive     * METHICILLIN RESISTANT STAPHYLOCOCCUS AUREUS  Blood Culture ID Panel (Reflexed)     Status: Abnormal   Collection Time: 09/24/15  9:45 AM  Result Value Ref Range Status   Enterococcus species NOT DETECTED NOT DETECTED Final   Vancomycin resistance NOT DETECTED NOT DETECTED Final   Listeria monocytogenes NOT DETECTED NOT DETECTED Final   Staphylococcus species DETECTED (A) NOT DETECTED Final    Comment: CRITICAL RESULT CALLED TO, READ BACK BY AND VERIFIED WITH: M FRENS,PHARMD AT 7628 09/25/15 BY L BENFIELD    Staphylococcus aureus DETECTED (A) NOT DETECTED Final    Comment: CRITICAL RESULT CALLED TO, READ BACK BY AND VERIFIED WITH: M FRENS,PHARMD AT 3151 09/25/15 BY L BENFIELD    Methicillin resistance DETECTED (A) NOT DETECTED Final    Comment: CRITICAL RESULT CALLED TO, READ BACK BY AND VERIFIED WITH: Edson Snowball AT 7616 09/25/15 BY L BENFIELD    Streptococcus species NOT DETECTED NOT DETECTED Final   Streptococcus agalactiae NOT DETECTED NOT DETECTED Final   Streptococcus pneumoniae NOT DETECTED NOT DETECTED  Final   Streptococcus pyogenes NOT DETECTED NOT DETECTED Final   Acinetobacter baumannii NOT DETECTED NOT DETECTED Final   Enterobacteriaceae species NOT DETECTED NOT DETECTED Final   Enterobacter cloacae complex NOT DETECTED NOT DETECTED Final   Escherichia coli NOT DETECTED NOT DETECTED Final   Klebsiella oxytoca NOT DETECTED NOT DETECTED Final   Klebsiella pneumoniae NOT DETECTED NOT DETECTED Final   Proteus species NOT DETECTED NOT DETECTED Final   Serratia marcescens NOT DETECTED NOT DETECTED Final   Carbapenem resistance NOT DETECTED NOT DETECTED Final   Haemophilus influenzae NOT DETECTED NOT DETECTED Final   Neisseria meningitidis NOT DETECTED NOT DETECTED Final   Pseudomonas aeruginosa NOT DETECTED NOT DETECTED Final   Candida albicans NOT DETECTED NOT DETECTED Final   Candida glabrata NOT DETECTED NOT DETECTED Final   Candida krusei NOT DETECTED NOT DETECTED Final   Candida parapsilosis NOT DETECTED NOT DETECTED Final   Candida tropicalis NOT DETECTED NOT DETECTED Final    Comment: Performed at [facility]  Culture, Urine     Status: Abnormal   Collection Time: 09/24/15  2:55 PM  Result Value Ref Range Status   Specimen Description URINE, RANDOM  Final   Special Requests NONE  Final   Culture (A)  Final    <10,000 COLONIES/mL INSIGNIFICANT GROWTH Performed at [facility]    Report Status 09/25/2015 FINAL  Final  Culture, blood (Routine X 2) w Reflex to ID Panel     Status: None (Preliminary result)   Collection Time: 09/26/15  1:11 PM  Result Value Ref Range Status   Specimen Description BLOOD RIGHT ARM  Final   Special Requests IN PEDIATRIC BOTTLE 0.5 CC  Final   Culture   Final    NO GROWTH < 24 HOURS Performed at [facility]    Report Status PENDING  Incomplete  Culture, blood (Routine X 2) w Reflex to  ID Panel     Status: None (Preliminary result)   Collection Time: 09/26/15  1:11 PM  Result Value Ref Range Status   Specimen Description  BLOOD LEFT HAND  Final   Special Requests IN PEDIATRIC BOTTLE 1 CC  Final   Culture   Final    NO GROWTH < 24 HOURS Performed at [facility]    Report Status PENDING  Incomplete         Radiology Studies: No results found.      Scheduled Meds: . antiseptic oral rinse  7 mL Mouth Rinse q12n4p  . chlorhexidine  15 mL Mouth Rinse BID  . dexamethasone  2 mg Oral Q12H  . docusate sodium  100 mg Oral BID  . feeding supplement (PRO-STAT SUGAR FREE 64)  30 mL Oral TID  . fentaNYL  50 mcg Transdermal Q72H  . furosemide  40 mg Intravenous Once  . gabapentin  100 mg Oral QHS  . lidocaine  1 patch Transdermal Q24H  . pantoprazole  40 mg Oral Daily  . polyethylene glycol  17 g Oral Daily  . potassium chloride  40 mEq Oral Once  . sodium chloride  1,000 mL Intravenous Once  . sodium chloride  1,000 mL Intravenous Once  . sodium chloride flush  3 mL Intravenous Q12H  . vancomycin  1,000 mg Intravenous Q24H  . warfarin  4 mg Oral ONCE-1800  . Warfarin - Pharmacist Dosing Inpatient   Does not apply q1800   Continuous Infusions:     LOS: 3 days    Time spent: 35 minutes.     Florencia Reasons, MD PhD [facility] Pager [phone]  If 7PM-7AM, please contact night-coverage [email or web address] Password [email or web address] 09/27/2015, 1:40 PM

## 2015-09-27 NOTE — Progress Notes (Signed)
Pharmacy Antibiotic Note  [name] is a 70 y.o. female known to Pharmacy from previous admission now readmitted on 09/24/2015.  She was just discharged from this hospital on 09/22/2015 and sent to SNF for management of MRSA bacteremia. The plan was for her to complete a 4 week course of IV vancomycin from 09/19/2015. She left SNF AMA on 09/23/15, reporting that she was not receiving pain medications or her antibiotics as ordered. She was taken home by a friend where she became profoundly weak and was found down and subsequently brought to the ED at [facility] in [place], then transferred to [facility]. Pt initially started on Vancomycin, Levaquin, and Zosyn, then narrowed to Vancomycin alone per ID.   Pharmacy assisting with vancomycin dosing for MRSA bacteremia. PICC removed 5/29.  Repeat blood cultures drawn 5/30.  Stable renal function.  Afebrile.  WBC WNL.    Plan: Continue Vancomycin 1g IV q24h, check VT tomorrow AM prior to 4th dose (goal trough 15-20 mcg/ml) Monitor renal function and cultures  Height: 5\' 2"  (157.5 cm) Weight: 124 lb 3.2 oz (56.337 kg) IBW/kg (Calculated) : 50.1  Temp (24hrs), Avg:98.4 F (36.9 C), Min:97.9 F (36.6 C), Max:99 F (37.2 C)   Recent Labs Lab 09/21/15 0505 09/21/15 1734 09/24/15 0845 09/24/15 0945 09/25/15 0500 09/26/15 0423 09/27/15 0432  WBC  --   --   --   --  10.6* 11.2* 9.7  CREATININE 0.50  --   --   --  0.82 0.54 0.62  LATICACIDVEN  --   --  1.6  --   --   --   --   VANCOTROUGH  --  11  --   --   --   --   --   VANCORANDOM  --   --   --  8  --   --   --     Estimated Creatinine Clearance: 51 mL/min (by C-G formula based on Cr of 0.62).    Allergies  Allergen Reactions  . Aspirin Nausea And Vomiting  . Boost Pudding [Nutritional Supplements] Diarrhea  . Percodan [Oxycodone-Aspirin] Nausea Only  . Codeine Nausea Only    Antimicrobials: 5/18 Cefepime >> 5/23 5/23 Ancef >> 5/23 5/18 Vancomycin >> 5/21, resume 5/23 >> 5/28  Zosyn >> 5/29 5/28 Levaquin >>5/29  Levels/dose changes this admission: 5/25 VT at 1730 =  11 on 500 mg q12h - increase to 750mg  q12 6/1 VT @ 0900 = _____  Microbiology results: BCx from [facility] growing MRSA 2/2 bottles, ID consulted 5/18 BCx x2:  NGF 5/18 HIV antb: NR 5/18 ur strep pneu: neg 5/18 ur legionella: neg 5/25 BCx (repeat): ngtd  Cultures this Admit: 5/28 BCx:  SA (sens pending); BCID = MRSA 5/28 UCx: <10K insig growth. 5/30 BCx2: NGTD  Thank you for allowing pharmacy to be a part of this patient's care.  Ralene Bathe, PharmD, BCPS 09/27/2015, 1:38 PM  Pager: [phone]

## 2015-09-27 NOTE — Progress Notes (Signed)
  Radiation Oncology         [phone] ________________________________  Name: [name] MRN: [id number]  Date: 09/21/2015  DOB: 08/03/1945  SIMULATION AND TREATMENT PLANNING NOTE    ICD-9-CM ICD-10-CM   1. Breast cancer metastasized to bone, unspecified laterality (HCC) 174.9 C50.919    198.5 C79.51     DIAGNOSIS: Metastatic breast cancer with diffuse osseous metastasis  NARRATIVE:  The patient was brought to the [place].  Identity was confirmed.  All relevant records and images related to the planned course of therapy were reviewed.  The patient freely provided informed written consent to proceed with treatment after reviewing the details related to the planned course of therapy. The consent form was witnessed and verified by the simulation staff.  Then, the patient was set-up in a stable reproducible  supine position for radiation therapy.  CT images were obtained.  Surface markings were placed.  The CT images were loaded into the planning software.  Then the target and avoidance structures were contoured.  Treatment planning then occurred.  The radiation prescription was entered and confirmed.  Then, I designed and supervised the construction of a total of 9 medically necessary complex treatment devices.  I have requested : 3-D plan. DVH, lens, optic n. GTV, PTV.  I have ordered:dose calc.  PLAN:  The patient will receive 20 Gy in 5 fractions Directed at the frontal skull, left rib cage area and right tibia.  ________________________________ -----------------------------------  Blair Promise, PhD, MD

## 2015-09-27 NOTE — Progress Notes (Signed)
[name]   DOB:07-05-1945   SL#:[id number]   TLX#:[id number]  Subjective: feels frustrated because she can't do for herself. Had a BM "all over" this morning. Pain control a problem-- "I ask and they say it's not time." No N/V. Started XRT yesterday, did not go well, hopes it will be easier today. No family in room   Objective: older  White woman examined in bed Filed Vitals:   09/26/15 2127 09/27/15 0445  BP: 119/59 151/65  Pulse: 77 82  Temp: 98.3 F (36.8 C) 97.9 F (36.6 C)  Resp: 18 18    Body mass index is 22.71 kg/(m^2).  Intake/Output Summary (Last 24 hours) at 09/27/15 0846 Last data filed at 09/27/15 0815  Gross per 24 hour  Intake    900 ml  Output   3850 ml  Net  -2950 ml     Sclerae unicteric  No peripheral adenopathy  Lungs no rales or rhonchi--auscultated anterolaterally  Heart regular rate and rhythm  Abdomen benign  Neuro nonfocal  Breast exam: deferred  CBG (last 3)  No results for input(s): GLUCAP in the last 72 hours.   Labs:  Lab Results  Component Value Date   WBC 9.7 09/27/2015   HGB 10.0* 09/27/2015   HCT 30.0* 09/27/2015   MCV 96.8 09/27/2015   PLT 181 09/27/2015   NEUTROABS 6.3 09/06/2015    @LASTCHEMISTRY @  Urine Studies No results for input(s): UHGB, CRYS in the last 72 hours.  Invalid input(s): UACOL, UAPR, USPG, UPH, UTP, UGL, UKET, UBIL, UNIT, UROB, ULEU, UEPI, UWBC, [place], [place], [place], [place], [place]  Basic Metabolic Panel:  Recent Labs Lab 09/21/15 0505 09/25/15 0500 09/26/15 0423 09/27/15 0432  NA 138 140 138 139  K 3.9 4.4 4.3 3.5  CL 101 109 106 104  CO2 30 27 27 26   GLUCOSE 126* 120* 101* 114*  BUN 23* 30* 21* 20  CREATININE 0.50 0.82 0.54 0.62  CALCIUM 8.3* 7.6* 8.6* 8.1*   GFR Estimated Creatinine Clearance: 51 mL/min (by C-G formula based on Cr of 0.62). Liver Function Tests:  Recent Labs Lab 09/25/15 0500  AST 15  ALT 28  ALKPHOS 121  BILITOT 0.5  PROT 4.7*  ALBUMIN 1.9*   No results for  input(s): LIPASE, AMYLASE in the last 168 hours. No results for input(s): AMMONIA in the last 168 hours. Coagulation profile  Recent Labs Lab 09/22/15 0507 09/24/15 0945 09/25/15 0500 09/26/15 0423 09/27/15 0432  INR 2.11* 1.96* 2.99* 2.93* 1.86*    CBC:  Recent Labs Lab 09/25/15 0500 09/26/15 0423 09/27/15 0432  WBC 10.6* 11.2* 9.7  HGB 6.8* 10.2* 10.0*  HCT 21.0* 30.5* 30.0*  MCV 102.4* 96.8 96.8  PLT 186 173 181   Cardiac Enzymes: No results for input(s): CKTOTAL, CKMB, CKMBINDEX, TROPONINI in the last 168 hours. BNP: Invalid input(s): POCBNP CBG: No results for input(s): GLUCAP in the last 168 hours. D-Dimer No results for input(s): DDIMER in the last 72 hours. Hgb A1c No results for input(s): HGBA1C in the last 72 hours. Lipid Profile No results for input(s): CHOL, HDL, LDLCALC, TRIG, CHOLHDL, LDLDIRECT in the last 72 hours. Thyroid function studies No results for input(s): TSH, T4TOTAL, T3FREE, THYROIDAB in the last 72 hours.  Invalid input(s): FREET3 Anemia work up No results for input(s): VITAMINB12, FOLATE, FERRITIN, TIBC, IRON, RETICCTPCT in the last 72 hours. Microbiology Recent Results (from the past 240 hour(s))  Culture, blood (Routine X 2) w Reflex to ID Panel  Status: None   Collection Time: 09/21/15  5:35 PM  Result Value Ref Range Status   Specimen Description BLOOD RIGHT ARM  Final   Special Requests BOTTLES DRAWN AEROBIC AND ANAEROBIC  5CC, 10CC  Final   Culture   Final    NO GROWTH 5 DAYS Performed at [facility]    Report Status 09/26/2015 FINAL  Final  Culture, blood (Routine X 2) w Reflex to ID Panel     Status: None   Collection Time: 09/21/15  5:45 PM  Result Value Ref Range Status   Specimen Description BLOOD RIGHT ARM  Final   Special Requests IN PEDIATRIC BOTTLE  4CC  Final   Culture   Final    NO GROWTH 5 DAYS Performed at [facility]    Report Status 09/26/2015 FINAL  Final  Culture, blood (single)      Status: Abnormal   Collection Time: 09/24/15  9:20 AM  Result Value Ref Range Status   Specimen Description BLOOD LEFT ARM  10 ML IN [facility] BOTTLE  Final   Special Requests NONE  Final   Culture  Setup Time   Final    GRAM POSITIVE COCCI IN CLUSTERS ANAEROBIC BOTTLE ONLY CRITICAL RESULT CALLED TO, READ BACK BY AND VERIFIED WITH: Damian Leavell PHARMD AT 1093 09/25/15 L. BENFIELD    Culture (A)  Final    STAPHYLOCOCCUS AUREUS SUSCEPTIBILITIES PERFORMED ON PREVIOUS CULTURE WITHIN THE LAST 5 DAYS. Performed at [facility]    Report Status 09/27/2015 FINAL  Final  Culture, blood (single)     Status: Abnormal   Collection Time: 09/24/15  9:45 AM  Result Value Ref Range Status   Specimen Description BLOOD PICC  10 ML IN [facility] BOTTLE  Final   Special Requests NONE  Final   Culture  Setup Time   Final    GRAM POSITIVE COCCI IN CLUSTERS ANAEROBIC BOTTLE ONLY CRITICAL RESULT CALLED TO, READ BACK BY AND VERIFIED WITH: Jerilynn Mages FRENS,PHARMD AT 2355 09/25/15 BY L BENFIELD Performed at [facility] (A)  Final   Report Status 09/27/2015 FINAL  Final   Organism ID, Bacteria METHICILLIN RESISTANT STAPHYLOCOCCUS AUREUS  Final      Susceptibility   Methicillin resistant staphylococcus aureus - MIC*    CIPROFLOXACIN >=8 RESISTANT Resistant     ERYTHROMYCIN >=8 RESISTANT Resistant     GENTAMICIN <=0.5 SENSITIVE Sensitive     OXACILLIN >=4 RESISTANT Resistant     TETRACYCLINE <=1 SENSITIVE Sensitive     VANCOMYCIN 1 SENSITIVE Sensitive     TRIMETH/SULFA <=10 SENSITIVE Sensitive     CLINDAMYCIN <=0.25 SENSITIVE Sensitive     RIFAMPIN <=0.5 SENSITIVE Sensitive     Inducible Clindamycin NEGATIVE Sensitive     * METHICILLIN RESISTANT STAPHYLOCOCCUS AUREUS  Blood Culture ID Panel (Reflexed)     Status: Abnormal   Collection Time: 09/24/15  9:45 AM  Result Value Ref Range Status   Enterococcus species NOT DETECTED NOT DETECTED Final    Vancomycin resistance NOT DETECTED NOT DETECTED Final   Listeria monocytogenes NOT DETECTED NOT DETECTED Final   Staphylococcus species DETECTED (A) NOT DETECTED Final    Comment: CRITICAL RESULT CALLED TO, READ BACK BY AND VERIFIED WITH: M FRENS,PHARMD AT 7322 09/25/15 BY L BENFIELD    Staphylococcus aureus DETECTED (A) NOT DETECTED Final    Comment: CRITICAL RESULT CALLED TO, READ BACK BY AND VERIFIED WITH: M FRENS,PHARMD AT 0254 09/25/15 BY  L BENFIELD    Methicillin resistance DETECTED (A) NOT DETECTED Final    Comment: CRITICAL RESULT CALLED TO, READ BACK BY AND VERIFIED WITH: Edson Snowball AT 0141 09/25/15 BY L BENFIELD    Streptococcus species NOT DETECTED NOT DETECTED Final   Streptococcus agalactiae NOT DETECTED NOT DETECTED Final   Streptococcus pneumoniae NOT DETECTED NOT DETECTED Final   Streptococcus pyogenes NOT DETECTED NOT DETECTED Final   Acinetobacter baumannii NOT DETECTED NOT DETECTED Final   Enterobacteriaceae species NOT DETECTED NOT DETECTED Final   Enterobacter cloacae complex NOT DETECTED NOT DETECTED Final   Escherichia coli NOT DETECTED NOT DETECTED Final   Klebsiella oxytoca NOT DETECTED NOT DETECTED Final   Klebsiella pneumoniae NOT DETECTED NOT DETECTED Final   Proteus species NOT DETECTED NOT DETECTED Final   Serratia marcescens NOT DETECTED NOT DETECTED Final   Carbapenem resistance NOT DETECTED NOT DETECTED Final   Haemophilus influenzae NOT DETECTED NOT DETECTED Final   Neisseria meningitidis NOT DETECTED NOT DETECTED Final   Pseudomonas aeruginosa NOT DETECTED NOT DETECTED Final   Candida albicans NOT DETECTED NOT DETECTED Final   Candida glabrata NOT DETECTED NOT DETECTED Final   Candida krusei NOT DETECTED NOT DETECTED Final   Candida parapsilosis NOT DETECTED NOT DETECTED Final   Candida tropicalis NOT DETECTED NOT DETECTED Final    Comment: Performed at [facility]  Culture, Urine     Status: Abnormal   Collection Time: 09/24/15  2:55  PM  Result Value Ref Range Status   Specimen Description URINE, RANDOM  Final   Special Requests NONE  Final   Culture (A)  Final    <10,000 COLONIES/mL INSIGNIFICANT GROWTH Performed at [facility]    Report Status 09/25/2015 FINAL  Final      Studies:  No results found.  Assessment: 70 y.o. [place], [place] woman status post right lumpectomy and axillary lymph node dissection December 2006 for a TX N2, stage IIIA invasive ductal carcinoma, estrogen and progesterone receptor positive, with subsequent stage IV disease  (1) received adjuvant doxorubicin, docetaxel and cyclophosphamide  (2) status post adjuvant radiation  (3) on tamoxifen to September 2011 when she was noted to have metastatic disease to bone  (4) fulvestrant 02/06/2009 to 01/15/2011  (5) left axillary lymph node positive for an invasive ductal carcinoma, estrogen receptor and HER-2 negative November 2011, and a right breast recurrence for an estrogen receptor positive lesion at the same time.  (6) status post bilateral mastectomies 06/28/2010  (7) status post left chest and axillary radiation  (8) status post right chest wall recurrence October of 2012, treated with electron beam radiation completed December 2012  (9) progression in bone August 2013,  (10) status post radiation to the lumbosacral spine completed October 2013   (11) capecitabine November 2013 through January 2014  (12) tamoxifen/ everolimus February 2014, discontinued November 2014 with progression  (13) Megace 80 mg twice daily with progression January 2015  (14) Radiation to lumbar spine February 2015  (15) letrozole plus Ibrance started 07/08/2013, discontinued May 2017 with progression  (16). Declines genetic testing (as of 08/17/2013)  (17) zolendronate 05/01/2009, discontinued because of pain; Xgeva started 10/08/2013, discontinued because of cost issues as of November 2015, switched to alendronate March 2016, switched back to  [place] but stopped after the May 2016 dose because of side effects, on ibandronate [125 mg/ day 21 days on, 7 days off] temporarily, denosumab to be resumed 10/02/2015  (18) cyclophosphamide, methotrexate, and fluorouracil to be started 10/02/2015, repeated  every 21 days.  (19) palliative radiation to be completed 10/02/2015    Plan:  Blood cultures from 05/28 still positive for MRSA. If blood cultures drawn yesterday prove negative we will proceed to chemotherapy 6/5 as scheduled. Can be done outpatient of course. Denosumab/Xgeva will be resumed same day  Expect she will need to transition through SNF with physical therapy before she can return home, though her cousin is now staying at her home and can help with her ADL.  Though she tells me she "can't get" pain medicine when she asks for it she only used breakthough pain meds 4 times yesterday-- would not increase the fentanyl at this time.  Will follow with you. Appreciate your help to this complex patient!  Chauncey Cruel, MD 09/27/2015  8:46 AM Medical Oncology and Hematology [facility] [address] [place], [place] [address] Tel. [phone]    Fax. [phone]

## 2015-09-27 NOTE — Progress Notes (Signed)
Physical Therapy Treatment Patient Details Name: [name] MRN: [id number] DOB: Sep 08, 1945 Today's Date: 09/27/2015    History of Present Illness 70 yo female admitted with Pna from [facility] in [place], SIRS (recent adm with same--pt went to [facility] and left AMA). Hx of breast cancer with bony mets, FTT, HTN    PT Comments    The patient is mobilizing well with RW today. She plans  To DC to home at some point. The sats were 93% on 2 liters after walking.  Follow Up Recommendations  Home health PT;No PT follow up;Supervision - Intermittent     Equipment Recommendations  Rolling walker with 5" wheels    Recommendations for Other Services       Precautions / Restrictions Precautions Precautions: Fall Precaution Comments: monitor sats    Mobility  Bed Mobility   Bed Mobility: Supine to Sit     Supine to sit: Supervision     General bed mobility comments: HOB elevated, no physical assist  Transfers Overall transfer level: Needs assistance Equipment used: Rolling walker (2 wheeled) Transfers: Sit to/from Stand Sit to Stand: Min guard         General transfer comment: cues for safety  Ambulation/Gait Ambulation/Gait assistance: Min guard Ambulation Distance (Feet): 350 Feet Assistive device: Rolling walker (2 wheeled) Gait Pattern/deviations: Step-through pattern     General Gait Details: gait is steady   Financial trader Rankin (Stroke Patients Only)       Balance           Standing balance support: During functional activity;Bilateral upper extremity supported Standing balance-Leahy Scale: Fair                      Cognition Arousal/Alertness: Awake/alert Behavior During Therapy: Flat affect;Agitated Overall Cognitive Status: Within Functional Limits for tasks assessed                      Exercises      General Comments        Pertinent Vitals/Pain Faces  Pain Scale: Hurts even more Pain Location: R leg Pain Descriptors / Indicators: Discomfort Pain Intervention(s): Monitored during session    Home Living                      Prior Function            PT Goals (current goals can now be found in the care plan section) Progress towards PT goals: Progressing toward goals    Frequency  Min 3X/week    PT Plan Current plan remains appropriate    Co-evaluation             End of Session Equipment Utilized During Treatment: Gait belt;Oxygen Activity Tolerance: Patient tolerated treatment well Patient left: in chair;with chair alarm set     Time: [id number] PT Time Calculation (min) (ACUTE ONLY): 19 min  Charges:  $Gait Training: 8-22 mins                    G Codes:      Marcelino Freestone PT [id number]  09/27/2015, 12:36 PM

## 2015-09-27 NOTE — Progress Notes (Signed)
Subjective:  C/o discomfort yesterday while receiving radiation   Antibiotics:  Anti-infectives    Start     Dose/Rate Route Frequency Ordered Stop   09/25/15 2200  levofloxacin (LEVAQUIN) IVPB 750 mg  Status:  Discontinued     750 mg 100 mL/hr over 90 Minutes Intravenous Every 48 hours 09/24/15 1057 09/26/15 1228   09/25/15 1000  vancomycin (VANCOCIN) IVPB 1000 mg/200 mL premix     1,000 mg 200 mL/hr over 60 Minutes Intravenous Every 24 hours 09/24/15 1055     09/25/15 0000  vancomycin (VANCOCIN) IVPB 750 mg/150 ml premix  Status:  Discontinued     750 mg 150 mL/hr over 60 Minutes Intravenous Every 12 hours 09/24/15 0823 09/24/15 1054   09/24/15 2000  piperacillin-tazobactam (ZOSYN) IVPB 3.375 g  Status:  Discontinued     3.375 g 12.5 mL/hr over 240 Minutes Intravenous Every 8 hours 09/24/15 1057 09/25/15 0941   09/24/15 1200  vancomycin (VANCOCIN) IVPB 1000 mg/200 mL premix  Status:  Discontinued     1,000 mg 200 mL/hr over 60 Minutes Intravenous  Once 09/24/15 1049 09/24/15 1056   09/24/15 1200  vancomycin (VANCOCIN) 1,250 mg in sodium chloride 0.9 % 250 mL IVPB     1,250 mg 166.7 mL/hr over 90 Minutes Intravenous  Once 09/24/15 1056 09/24/15 1412   09/24/15 1130  piperacillin-tazobactam (ZOSYN) IVPB 3.375 g     3.375 g 100 mL/hr over 30 Minutes Intravenous  Once 09/24/15 1052 09/24/15 1201      Medications: Scheduled Meds: . antiseptic oral rinse  7 mL Mouth Rinse q12n4p  . chlorhexidine  15 mL Mouth Rinse BID  . dexamethasone  2 mg Oral Q12H  . docusate sodium  100 mg Oral BID  . feeding supplement (PRO-STAT SUGAR FREE 64)  30 mL Oral TID  . fentaNYL  50 mcg Transdermal Q72H  . gabapentin  100 mg Oral QHS  . lidocaine  1 patch Transdermal Q24H  . pantoprazole  40 mg Oral Daily  . polyethylene glycol  17 g Oral Daily  . sodium chloride  1,000 mL Intravenous Once  . sodium chloride  1,000 mL Intravenous Once  . sodium chloride flush  3 mL Intravenous  Q12H  . vancomycin  1,000 mg Intravenous Q24H  . Warfarin - Pharmacist Dosing Inpatient   Does not apply q1800   Continuous Infusions:   PRN Meds:.acetaminophen **OR** acetaminophen, magic mouthwash w/lidocaine, morphine, morphine injection, ondansetron **OR** ondansetron (ZOFRAN) IV, polyvinyl alcohol, sodium chloride flush    Objective: Weight change:   Intake/Output Summary (Last 24 hours) at 09/27/15 2007 Last data filed at 09/27/15 1800  Gross per 24 hour  Intake 1505.33 ml  Output   2100 ml  Net -594.67 ml   Blood pressure 135/65, pulse 87, temperature 98 F (36.7 C), temperature source Oral, resp. rate 18, height 5\' 2"  (1.575 m), weight 124 lb 3.2 oz (56.337 kg), SpO2 100 %. Temp:  [97.9 F (36.6 C)-98.3 F (36.8 C)] 98 F (36.7 C) (05/31 1357) Pulse Rate:  [77-87] 87 (05/31 1357) Resp:  [18] 18 (05/31 1357) BP: (119-151)/(59-65) 135/65 mmHg (05/31 1357) SpO2:  [95 %-100 %] 100 % (05/31 1357)  Physical Exam: General: Alert and awake, oriented x3, not in any acute distress. HEENT: anicteric sclera, EOMI, oropharynx clear and without exudate Cardiovascular: regular rate, normal r, no murmur rubs or gallops Pulmonary: decreased BS at bases, no wheezing, rales or rhonchi Gastrointestinal: soft nontender, nondistended, normal  bowel sounds, Musculoskeletal: tender to palpation along rig cage Skin, soft tissue: no rashes, + ecchymoses, right PICC line site bandaged Neuro: nonfocal, strength and sensation intact  CBC:   CBC Latest Ref Rng 09/27/2015 09/26/2015 09/25/2015  WBC 4.0 - 10.5 K/uL 9.7 11.2(H) 10.6(H)  Hemoglobin 12.0 - 15.0 g/dL 10.0(L) 10.2(L) 6.8(LL)  Hematocrit 36.0 - 46.0 % 30.0(L) 30.5(L) 21.0(L)  Platelets 150 - 400 K/uL 181 173 186      BMET  Recent Labs  09/26/15 0423 09/27/15 0432  NA 138 139  K 4.3 3.5  CL 106 104  CO2 27 26  GLUCOSE 101* 114*  BUN 21* 20  CREATININE 0.54 0.62  CALCIUM 8.6* 8.1*     Liver Panel   Recent  Labs  09/25/15 0500  PROT 4.7*  ALBUMIN 1.9*  AST 15  ALT 28  ALKPHOS 121  BILITOT 0.5       Sedimentation Rate No results for input(s): ESRSEDRATE in the last 72 hours. C-Reactive Protein No results for input(s): CRP in the last 72 hours.  Micro Results: Recent Results (from the past 720 hour(s))  Culture, blood (routine x 2)     Status: None   Collection Time: 09/14/15  6:35 PM  Result Value Ref Range Status   Specimen Description BLOOD RIGHT ARM  Final   Special Requests BOTTLES DRAWN AEROBIC AND ANAEROBIC [place]  Final   Culture   Final    NO GROWTH 5 DAYS Performed at [facility]    Report Status 09/19/2015 FINAL  Final  Culture, blood (routine x 2)     Status: None   Collection Time: 09/14/15  6:40 PM  Result Value Ref Range Status   Specimen Description BLOOD LEFT HAND  Final   Special Requests BOTTLES DRAWN AEROBIC AND ANAEROBIC 5CC  Final   Culture   Final    NO GROWTH 5 DAYS Performed at [facility]    Report Status 09/19/2015 FINAL  Final  Culture, blood (Routine X 2) w Reflex to ID Panel     Status: None   Collection Time: 09/21/15  5:35 PM  Result Value Ref Range Status   Specimen Description BLOOD RIGHT ARM  Final   Special Requests BOTTLES DRAWN AEROBIC AND ANAEROBIC  5CC, 10CC  Final   Culture   Final    NO GROWTH 5 DAYS Performed at [facility]    Report Status 09/26/2015 FINAL  Final  Culture, blood (Routine X 2) w Reflex to ID Panel     Status: None   Collection Time: 09/21/15  5:45 PM  Result Value Ref Range Status   Specimen Description BLOOD RIGHT ARM  Final   Special Requests IN PEDIATRIC BOTTLE  4CC  Final   Culture   Final    NO GROWTH 5 DAYS Performed at [facility]    Report Status 09/26/2015 FINAL  Final  Culture, blood (single)     Status: Abnormal   Collection Time: 09/24/15  9:20 AM  Result Value Ref Range Status   Specimen Description BLOOD LEFT ARM  10 ML IN [facility] BOTTLE  Final   Special  Requests NONE  Final   Culture  Setup Time   Final    GRAM POSITIVE COCCI IN CLUSTERS ANAEROBIC BOTTLE ONLY CRITICAL RESULT CALLED TO, READ BACK BY AND VERIFIED WITH: Damian Leavell PHARMD AT [id number] 09/25/15 L. BENFIELD    Culture (A)  Final    STAPHYLOCOCCUS AUREUS SUSCEPTIBILITIES PERFORMED ON PREVIOUS  CULTURE WITHIN THE LAST 5 DAYS. Performed at [facility]    Report Status 09/27/2015 FINAL  Final  Culture, blood (single)     Status: Abnormal   Collection Time: 09/24/15  9:45 AM  Result Value Ref Range Status   Specimen Description BLOOD PICC  10 ML IN [facility] BOTTLE  Final   Special Requests NONE  Final   Culture  Setup Time   Final    GRAM POSITIVE COCCI IN CLUSTERS ANAEROBIC BOTTLE ONLY CRITICAL RESULT CALLED TO, READ BACK BY AND VERIFIED WITH: Jerilynn Mages FRENS,PHARMD AT [id number] 09/25/15 BY L BENFIELD Performed at [place] (A)  Final   Report Status 09/27/2015 FINAL  Final   Organism ID, Bacteria METHICILLIN RESISTANT STAPHYLOCOCCUS AUREUS  Final      Susceptibility   Methicillin resistant staphylococcus aureus - MIC*    CIPROFLOXACIN >=8 RESISTANT Resistant     ERYTHROMYCIN >=8 RESISTANT Resistant     GENTAMICIN <=0.5 SENSITIVE Sensitive     OXACILLIN >=4 RESISTANT Resistant     TETRACYCLINE <=1 SENSITIVE Sensitive     VANCOMYCIN 1 SENSITIVE Sensitive     TRIMETH/SULFA <=10 SENSITIVE Sensitive     CLINDAMYCIN <=0.25 SENSITIVE Sensitive     RIFAMPIN <=0.5 SENSITIVE Sensitive     Inducible Clindamycin NEGATIVE Sensitive     * METHICILLIN RESISTANT STAPHYLOCOCCUS AUREUS  Blood Culture ID Panel (Reflexed)     Status: Abnormal   Collection Time: 09/24/15  9:45 AM  Result Value Ref Range Status   Enterococcus species NOT DETECTED NOT DETECTED Final   Vancomycin resistance NOT DETECTED NOT DETECTED Final   Listeria monocytogenes NOT DETECTED NOT DETECTED Final   Staphylococcus species DETECTED (A) NOT DETECTED Final     Comment: CRITICAL RESULT CALLED TO, READ BACK BY AND VERIFIED WITH: M FRENS,PHARMD AT [id number] 09/25/15 BY L BENFIELD    Staphylococcus aureus DETECTED (A) NOT DETECTED Final    Comment: CRITICAL RESULT CALLED TO, READ BACK BY AND VERIFIED WITH: M FRENS,PHARMD AT [id number] 09/25/15 BY L BENFIELD    Methicillin resistance DETECTED (A) NOT DETECTED Final    Comment: CRITICAL RESULT CALLED TO, READ BACK BY AND VERIFIED WITH: M FRENS,PHARMD AT [id number] 09/25/15 BY L BENFIELD    Streptococcus species NOT DETECTED NOT DETECTED Final   Streptococcus agalactiae NOT DETECTED NOT DETECTED Final   Streptococcus pneumoniae NOT DETECTED NOT DETECTED Final   Streptococcus pyogenes NOT DETECTED NOT DETECTED Final   Acinetobacter baumannii NOT DETECTED NOT DETECTED Final   Enterobacteriaceae species NOT DETECTED NOT DETECTED Final   Enterobacter cloacae complex NOT DETECTED NOT DETECTED Final   Escherichia coli NOT DETECTED NOT DETECTED Final   Klebsiella oxytoca NOT DETECTED NOT DETECTED Final   Klebsiella pneumoniae NOT DETECTED NOT DETECTED Final   Proteus species NOT DETECTED NOT DETECTED Final   Serratia marcescens NOT DETECTED NOT DETECTED Final   Carbapenem resistance NOT DETECTED NOT DETECTED Final   Haemophilus influenzae NOT DETECTED NOT DETECTED Final   Neisseria meningitidis NOT DETECTED NOT DETECTED Final   Pseudomonas aeruginosa NOT DETECTED NOT DETECTED Final   Candida albicans NOT DETECTED NOT DETECTED Final   Candida glabrata NOT DETECTED NOT DETECTED Final   Candida krusei NOT DETECTED NOT DETECTED Final   Candida parapsilosis NOT DETECTED NOT DETECTED Final   Candida tropicalis NOT DETECTED NOT DETECTED Final    Comment: Performed at [facility]  Culture, Urine     Status: Abnormal  Collection Time: 09/24/15  2:55 PM  Result Value Ref Range Status   Specimen Description URINE, RANDOM  Final   Special Requests NONE  Final   Culture (A)  Final    <10,000 COLONIES/mL INSIGNIFICANT  GROWTH Performed at [facility]    Report Status 09/25/2015 FINAL  Final  Culture, blood (Routine X 2) w Reflex to ID Panel     Status: None (Preliminary result)   Collection Time: 09/26/15  1:11 PM  Result Value Ref Range Status   Specimen Description BLOOD RIGHT ARM  Final   Special Requests IN PEDIATRIC BOTTLE 0.5 CC  Final   Culture   Final    NO GROWTH < 24 HOURS Performed at [facility]    Report Status PENDING  Incomplete  Culture, blood (Routine X 2) w Reflex to ID Panel     Status: None (Preliminary result)   Collection Time: 09/26/15  1:11 PM  Result Value Ref Range Status   Specimen Description BLOOD LEFT HAND  Final   Special Requests IN PEDIATRIC BOTTLE 1 CC  Final   Culture   Final    NO GROWTH < 24 HOURS Performed at [facility]    Report Status PENDING  Incomplete    Studies/Results: No results found.    Assessment/Plan:  INTERVAL HISTORY:  09/26/15: PICC removed yesterday , cultures obtained prior to PICC removal persistently positive   Principal Problem:   MRSA bacteremia Active Problems:   GERD   Personal history of thrombophlebitis   Breast cancer metastasized to bone (HCC)   AKI (acute kidney injury) ([place])   Essential hypertension   Protein-calorie malnutrition, severe (HCC)   Pain from bone metastases (HCC)   Generalized abdominal pain   Elevated LFTs   Rib pain on left side   Acute septic pulmonary embolism (HCC)   Acute respiratory failure with hypoxia (HCC)   Leukocytosis   Necrotizing pneumonia ([place])   PICC line infection   Goals of care, counseling/discussion    [name] is a 70 y.o. 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 Specific aspects of clinical management are critical to optimizing the outcome of patients with SAB. Therefore, the [facility] Antimicrobial Management Team [facility]) has initiated an intervention aimed at improving the management of SAB at [facility]. To do so, Infectious Diseases physicians are providing an evidence-based consult for the management of all patients with SAB.     Yes No Comments  Perform follow-up blood cultures (even if the patient is afebrile) to ensure clearance of bacteremia [X]  [ ]  REPEAT BLOOD CULTURES 09/26/15 NG AT 24 HOURS  Remove vascular catheter and obtain follow-up blood cultures after the removal of the catheter Valu.[place] ] [ ]  DC PICC LINE AND DO NOT PLACE NEW ONE UNTIL REPEAT BLOOD CULTURES FROM 09/26/15 ARE CLEAR AT 5 DAYS FINAL  WITH RE TO PORT PLACEMENT I WOULD BE IN FAVOR OF STARTING WITH PICC ONCE HER BACTEREMIA HAS CLEARED AND USING IT UNTIL WE ARE SURE WE HAVE CURED HER MRSA BACTEREMIA. I WOULD HATE TO HAVE HER HAVE YET ANOTHER RECURRENCE OF HER BACTEREMIA AND HAVE TO HAVE THE PORT REMOVED    Perform echocardiography to evaluate for endocarditis (transthoracic ECHO  is 40-50% sensitive, TEE is > 90% sensitive) [ ]  [ ]  Please keep in mind, that neither test can definitively EXCLUDE endocarditis, and that should clinical suspicion remain high for endocarditis the patient should then still be treated with an "endocarditis" duration of therapy = 6 weeks  sHE HAD TEE already last week no need to repeat  Consult electrophysiologist to evaluate implanted cardiac device (pacemaker, ICD) [ ]  [ ]  NA  Ensure source control [ ]  [ ]  Have all abscesses been drained effectively? Have deep seeded infections (septic joints or osteomyelitis) had appropriate surgical debridement?  PICC line IS OUT AND NOW WE NEED TO CLEAR HER BLOOD  Investigate for "metastatic" sites of infection [ ]  [ ]  Does the  patient have ANY symptom or physical exam finding that would suggest a deeper infection (back or neck pain that may be suggestive of vertebral osteomyelitis or epidural abscess, muscle pain that could be a symptom of pyomyositis)?  Keep in mind that for deep seeded infections MRI imaging with contrast is preferred rather than other often insensitive tests such as plain x-rays, especially early in a patient's presentation.  Change antibiotic therapy to   vancomycin [ ]  [ ]  Beta-lactam antibiotics are preferred for MSSA due to higher cure rates.  If on Vancomycin, goal trough should be 15 - 20 mcg/mL  Estimated duration of IV antibiotic therapy: 4 weeks AFTER PICC out and repeat blood cultures ARE NO GROWTH [ ]  [ ]  Consult case management for probably prolonged outpatient IV antibiotic therapy       Dr. Megan Salon is taking over the service tomorrow.   LOS: 3 days   Alcide Evener 09/27/2015, 8:07 PM

## 2015-09-27 NOTE — Care Management Important Message (Signed)
Important Message  Patient Details  Name: TAWNIA ERBER MRN: [id number] Date of Birth: 08-01-1945   Medicare Important Message Given:  Yes    [name] 09/27/2015, 10:13 AMImportant Message  Patient Details  Name: TENNIE RABOLD MRN: [id number] Date of Birth: 1945-11-01   Medicare Important Message Given:  Yes    [name] 09/27/2015, 10:13 AM

## 2015-09-28 ENCOUNTER — Ambulatory Visit
Admit: 2015-09-28 | Discharge: 2015-09-28 | Disposition: A | Discharge status: Home / Self Care | Payer: Medicare PPO | Attending: Radiation Oncology | Admitting: Radiation Oncology

## 2015-09-28 DIAGNOSIS — A4902 Methicillin resistant Staphylococcus aureus infection, unspecified site: Secondary | ICD-10-CM

## 2015-09-28 DIAGNOSIS — J9601 Acute respiratory failure with hypoxia: Secondary | ICD-10-CM

## 2015-09-28 LAB — BASIC METABOLIC PANEL
ANION GAP: 6 (ref 5–15)
BUN: 25 mg/dL — ABNORMAL HIGH (ref 6–20)
CHLORIDE: 103 mmol/L (ref 101–111)
CO2: 30 mmol/L (ref 22–32)
Calcium: 7.6 mg/dL — ABNORMAL LOW (ref 8.9–10.3)
Creatinine, Ser: 0.71 mg/dL (ref 0.44–1.00)
GFR calc non Af Amer: >60 mL/min (ref >60)
Glucose, Bld: 126 mg/dL — ABNORMAL HIGH (ref 65–99)
Potassium: 4.2 mmol/L (ref 3.5–5.1)
Sodium: 139 mmol/L (ref 135–145)

## 2015-09-28 LAB — CBC
HEMATOCRIT: 30.4 % — AB (ref 36.0–46.0)
HEMOGLOBIN: 10.1 g/dL — AB (ref 12.0–15.0)
MCH: 32.4 pg (ref 26.0–34.0)
MCHC: 33.2 g/dL (ref 30.0–36.0)
MCV: 97.4 fL (ref 78.0–100.0)
Platelets: 197 10*3/uL (ref 150–400)
RBC: 3.12 MIL/uL — ABNORMAL LOW (ref 3.87–5.11)
RDW: 19.8 % — ABNORMAL HIGH (ref 11.5–15.5)
WBC: 9.8 10*3/uL (ref 4.0–10.5)

## 2015-09-28 LAB — OCCULT BLOOD X 1 CARD TO LAB, STOOL: FECAL OCCULT BLD: NEGATIVE

## 2015-09-28 LAB — MRSA PCR SCREENING: MRSA by PCR: NEGATIVE

## 2015-09-28 LAB — PROTIME-INR
INR: 1.76 — ABNORMAL HIGH (ref 0.00–1.49)
Prothrombin Time: 19.9 seconds — ABNORMAL HIGH (ref 11.6–15.2)

## 2015-09-28 LAB — VANCOMYCIN, TROUGH: VANCOMYCIN TR: 13 ug/mL (ref 10.0–20.0)

## 2015-09-28 LAB — MAGNESIUM: Magnesium: 1.3 mg/dL — ABNORMAL LOW (ref 1.7–2.4)

## 2015-09-28 MED ORDER — WARFARIN SODIUM 2 MG PO TABS
2.0000 mg | ORAL_TABLET | Freq: Once | ORAL | Status: AC
  Administered 2015-09-28: 2 mg via ORAL
  Filled 2015-09-28: qty 1

## 2015-09-28 MED ORDER — MORPHINE SULFATE 15 MG PO TABS
15.0000 mg | ORAL_TABLET | ORAL | Status: DC | PRN
  Administered 2015-09-28 – 2015-10-04 (×31): 15 mg via ORAL
  Filled 2015-09-28 (×31): qty 1

## 2015-09-28 MED ORDER — MAGNESIUM SULFATE 2 GM/50ML IV SOLN
2.0000 g | Freq: Once | INTRAVENOUS | Status: AC
  Administered 2015-09-28: 2 g via INTRAVENOUS
  Filled 2015-09-28: qty 50

## 2015-09-28 NOTE — Progress Notes (Signed)
Pharmacy Antibiotic Note  [name] is a 70 y.o. female known to Pharmacy from previous admission now readmitted on 09/24/2015.  She was just discharged from this hospital on 09/22/2015 and sent to SNF for management of MRSA bacteremia. The plan was for her to complete a 4 week course of IV vancomycin from 09/19/2015. She left SNF AMA on 09/23/15, reporting that she was not receiving pain medications or her antibiotics as ordered. She was taken home by a friend where she became profoundly weak and was found down and subsequently brought to the ED at [facility] in [place], then transferred to [facility] Pt initially started on Vancomycin, Levaquin, and Zosyn, then narrowed to Vancomycin alone per ID.   PICC removed 5/29  Today, 09/28/2015  Day #3 of 28 vancomycin (starting DOT from 5/30 - 1st BCx wiith NGTD)  Vancomycin trough (=12mcg/ml) slightly below goal on 15-20mcg/ml).  Trough drawn prior to 5th dose so anticipate will continue to rise into goal range within subsequent doses  SCr appears stable but watch trend (? Increasing)  CBC: WBC WNL  afebrile  Plan: Continue Vancomycin 1g IV q24h as anticipate level to be therapeutic with continued therapy.   Recheck SCr within 48h, recheck trough in 4-5 days (or prior to discharge) Monitor renal function and cultures  Height: 5\' 2"  (157.5 cm) Weight: 124 lb 3.2 oz (56.337 kg) IBW/kg (Calculated) : 50.1  Temp (24hrs), Avg:97.8 F (36.6 C), Min:97.6 F (36.4 C), Max:98 F (36.7 C)   Recent Labs Lab 09/21/15 1734 09/24/15 0845 09/24/15 0945 09/25/15 0500 09/26/15 0423 09/27/15 0432 09/28/15 0439 09/28/15 0914  WBC  --   --   --  10.6* 11.2* 9.7 9.8  --   CREATININE  --   --   --  0.82 0.54 0.62 0.71  --   LATICACIDVEN  --  1.6  --   --   --   --   --   --   VANCOTROUGH 11  --   --   --   --   --   --  13  VANCORANDOM  --   --  8  --   --   --   --   --     Estimated Creatinine Clearance: 51 mL/min (by C-G formula based on  Cr of 0.71).    Allergies  Allergen Reactions  . Aspirin Nausea And Vomiting  . Boost Pudding [Nutritional Supplements] Diarrhea  . Percodan [Oxycodone-Aspirin] Nausea Only  . Codeine Nausea Only    Antimicrobials: 5/18 Cefepime >> 5/23 5/23 Ancef >> 5/23 5/18 Vancomycin >> 5/21, resume 5/23 >> 5/28 Zosyn >> 5/29 5/28 Levaquin >>5/29  Levels/dose changes this admission: 5/25 VT at 1730 =  11 on 500 mg q12h - increase to 750mg  q12 6/1 VT @ 0900 = 13 on 1gm IV q24h (prior to 5th dose)  Microbiology results: BCx from [facility] growing MRSA 2/2 bottles, ID consulted 5/18 BCx x2:  NGF 5/18 HIV antb: NR 5/18 ur strep pneu: neg 5/18 ur legionella: neg 5/25 BCx (repeat): ngtd  Cultures this Admit: 5/28 BCx:  MRSA (Vanco MIC = 1); BCID = MRSA 5/28 UCx: <10K insig growth. 5/30 BCx2: NGTD  Thank you for allowing pharmacy to be a part of this patient's care.  Doreene Eland, PharmD, BCPS.   Pager: [phone] 09/28/2015 10:06 AM

## 2015-09-28 NOTE — Progress Notes (Signed)
[facility]  Patient Status:  New pt for [facility] this admission  [facility] is providing the following services: [facility] will provide home IV ABX for pt in the home upon DC.  I have reached out [place] to see if they can accept pt for [facility] services.   I am faxing referral information to them tonight for leadership review/approval in the a.m. I will advise pt case manager of updated status for [facility] tomorrow a.m. once we have confirmation of acceptance.   If patient discharges after hours, please call [phone].   Larry Sierras 09/28/2015, 5:34 PM

## 2015-09-28 NOTE — Progress Notes (Signed)
PROGRESS NOTE    [name]  QQP:[id number] DOB: 04/29/1946 DOA: 09/24/2015 PCP: Curlene Labrum, MD    Brief Narrative: [name] is a 70 y.o. woman with a history of metastatic breast cancer, bone pain anticipating palliative radiation therapy, severe protein calorie malnutrition with adult failure to thrive, HTN, and GERD who was just discharged from this hospital on 09/22/2015 and sent to SNF for management of MRSA bacteremia. The plan was for her to complete a 4 week course of IV vancomycin from 09/19/2015. She left SNF AMA at some point yesterday, reporting that she was not receiving pain medications or her antibiotics as ordered. She was taken home by a friend (without access to supplemental oxygen). She became profoundly weak and was found down. She insists that she did no lose consciousness; she was just too weak to get herself up. She complains of chronic pain at this point. She had left sided pleuritic chest pain that improved with oxygen. She reports that she was only SOB when she was off of oxygen. Again, no LOC. No evidence of acute blood loss. No nausea or vomiting ("I don't know why they gave me Zofran at the other hospital."). 1-2 loose stools daily.  ED Course: The patient was evaluated in the ED at [facility]. Pertinent findings include new leukocytosis compared to discharge and evidence of acute kidney injury. No documented fever. Alk phos remains elevated, but LFTs have otherwise improved. Protein levels remain low. CT chest interpreted as multifocal pneumonia; I suspect this represents the same process that was identified here by chest CT on Sep 20, 2015.   Assessment & Plan:   Principal Problem:   MRSA bacteremia Active Problems:   GERD   Personal history of thrombophlebitis   Breast cancer metastasized to bone (HCC)   AKI (acute kidney injury) ([place])   Essential hypertension   Protein-calorie malnutrition, severe (HCC)   Pain from bone  metastases (HCC)   Generalized abdominal pain   Elevated LFTs   Rib pain on left side   Acute septic pulmonary embolism (HCC)   Acute respiratory failure with hypoxia (HCC)   Leukocytosis   Necrotizing pneumonia (HCC)   PICC line infection   Goals of care, counseling/discussion   Cavitary pneumonia  MRSA bacteremia with HCAP and probable septic embolic to the lung with new leukocytosis compared to discharge -- urine culture insignificant growth, blood cultures from 5/28 +MRSA 2/2, repeat blood culture from 5/30, no growth continue  -picc line placed from previously hospitalization removed on 5/30 -continue Vancomycin which was started from admission, she received levaquin, and zosyn initially on admission -TEE no vegetation on 5/25 from previous hospitalization, no need to repeat TEE, port placement early next week per ID recommendation-appreciate ID input   Hypoxia: o2 dropped to 82 on room air  While ambulating, will need arrange home o2.  History of metastatic breast cancer with bone pain: Recent PET scan on 08/31/15 showed numerous osseous mets-followed by Dr [name]. Recent MRI entire spine on 4/28 showed multiple osseous spinal mets with no cord compression. Plan for port placement and chemotherapy postponed with last admission due to acute illness.  Palliative radiation therapy anticipated for skull, left rib cage, and right tibial lesions. Discussed with Dr [name] patient will received localized radiation, ok to proceed to prevents complication meningion carcinomatosis.  pateint to received total of 5 days xrt, Last xrt on 6/5 per rad onc.  History of abnormal LFTs --Overall improved. No evidence of obvious liver mets  on recent imaging. Continue to monitor.  Chronic pain --Fentanyl patch, MSIR prn (may need to change to dilaudid if renal failure persists)  GERD --Continue PPI  AKI with relative hypotension --Will aggressively hydrate and monitor for now --If renal  function does not improve, will need further work-up --Pharmacy to renally dose medications  Acute on Chronic anemia --Hgb drop to 6,8 0n 5/29. Received   2 units PRBC 5-30. Hb increase to 10/  Hold coumadin held on 5/29 and 5/30, hgb has been stable, coumadin resumed on 5/32.  Stool guaiac pending collection.  Chronic anticoagulation for chronic, extensive superficial thrombophlebitis coumadin held on 5/29 and 5/30, due to drop of hemoglobin on 5/29.  hgb stabilized after prbc transfusion, no overt sign of bleed, coumadin resumed on 5/31 target on lower end of therapeutic range, monitor hgb, inr, Patient does has bilateral lower extremity edema, venous US no dvt, trial of lasix on 5/31  History of hypertension but now showing relative hypotension --s/p IV fluids.  --Currently asymptomatic  Anorexia: Likely secondary to underlying malignancy worsened by pneumonia.   Protein-calorie malnutrition, severe: Continue supplements    DVT prophylaxis: on coumadin  Code Status: DNR Family Communication: care discussed with patient and her cousin in the room Disposition Plan: remain inpatient for treatment of Bacteremia   Consultants:   ID  Oncology  Rad onc   Procedures:   Removal of picc line on 5/30  Antimicrobials:   Vancomycin    Subjective: Sitting in chair, c/o bilateral leg pain/feet pain, cousin in the room.  Objective: Filed Vitals:   09/27/15 1357 09/27/15 2130 09/27/15 2334 09/28/15 0531  BP: 135/65 124/72 120/60 127/68  Pulse: 87 99 97 80  Temp: 98 F (36.7 C) 97.6 F (36.4 C)  97.7 F (36.5 C)  TempSrc: Oral Oral  Axillary  Resp: 18 18  18   Height:      Weight:      SpO2: 100% 94%  95%    Intake/Output Summary (Last 24 hours) at 09/28/15 1152 Last data filed at 09/28/15 0900  Gross per 24 hour  Intake 2005.33 ml  Output    700 ml  Net 1305.33 ml   Filed Weights   09/24/15 0605  Weight: 56.337 kg (124 lb 3.2 oz)     Examination:  General exam: Appears calm and comfortable  Respiratory system: Clear to auscultation. Respiratory effort normal. Cardiovascular system: S1 & S2 heard, RRR. No JVD, murmurs, rubs, gallops or clicks. 1-2+ pedal edema. Gastrointestinal system: Abdomen is nondistended, soft and nontender. No organomegaly or masses felt. Normal bowel sounds heard. Central nervous system: Alert and oriented. No focal neurological deficits. Extremities: Symmetric 5 x 5 power.  Skin: No rashes, lesions or ulcers     Data Reviewed: I have personally reviewed following labs and imaging studies  CBC:  Recent Labs Lab 09/25/15 0500 09/26/15 0423 09/27/15 0432 09/28/15 0439  WBC 10.6* 11.2* 9.7 9.8  HGB 6.8* 10.2* 10.0* 10.1*  HCT 21.0* 30.5* 30.0* 30.4*  MCV 102.4* 96.8 96.8 97.4  PLT 186 173 181 619   Basic Metabolic Panel:  Recent Labs Lab 09/25/15 0500 09/26/15 0423 09/27/15 0432 09/28/15 0439  NA 140 138 139 139  K 4.4 4.3 3.5 4.2  CL 109 106 104 103  CO2 27 27 26 30   GLUCOSE 120* 101* 114* 126*  BUN 30* 21* 20 25*  CREATININE 0.82 0.54 0.62 0.71  CALCIUM 7.6* 8.6* 8.1* 7.6*  MG  --   --   --  1.3*   GFR: Estimated Creatinine Clearance: 51 mL/min (by C-G formula based on Cr of 0.71). Liver Function Tests:  Recent Labs Lab 09/25/15 0500  AST 15  ALT 28  ALKPHOS 121  BILITOT 0.5  PROT 4.7*  ALBUMIN 1.9*   No results for input(s): LIPASE, AMYLASE in the last 168 hours. No results for input(s): AMMONIA in the last 168 hours. Coagulation Profile:  Recent Labs Lab 09/24/15 0945 09/25/15 0500 09/26/15 0423 09/27/15 0432 09/28/15 0439  INR 1.96* 2.99* 2.93* 1.86* 1.76*   Cardiac Enzymes: No results for input(s): CKTOTAL, CKMB, CKMBINDEX, TROPONINI in the last 168 hours. BNP (last 3 results) No results for input(s): PROBNP in the last 8760 hours. HbA1C: No results for input(s): HGBA1C in the last 72 hours. CBG: No results for input(s): GLUCAP in the  last 168 hours. Lipid Profile: No results for input(s): CHOL, HDL, LDLCALC, TRIG, CHOLHDL, LDLDIRECT in the last 72 hours. Thyroid Function Tests: No results for input(s): TSH, T4TOTAL, FREET4, T3FREE, THYROIDAB in the last 72 hours. Anemia Panel: No results for input(s): VITAMINB12, FOLATE, FERRITIN, TIBC, IRON, RETICCTPCT in the last 72 hours. Sepsis Labs:  Recent Labs Lab 09/24/15 0845  LATICACIDVEN 1.6    Recent Results (from the past 240 hour(s))  Culture, blood (Routine X 2) w Reflex to ID Panel     Status: None   Collection Time: 09/21/15  5:35 PM  Result Value Ref Range Status   Specimen Description BLOOD RIGHT ARM  Final   Special Requests BOTTLES DRAWN AEROBIC AND ANAEROBIC  5CC, 10CC  Final   Culture   Final    NO GROWTH 5 DAYS Performed at [facility]    Report Status 09/26/2015 FINAL  Final  Culture, blood (Routine X 2) w Reflex to ID Panel     Status: None   Collection Time: 09/21/15  5:45 PM  Result Value Ref Range Status   Specimen Description BLOOD RIGHT ARM  Final   Special Requests IN PEDIATRIC BOTTLE  4CC  Final   Culture   Final    NO GROWTH 5 DAYS Performed at [facility]    Report Status 09/26/2015 FINAL  Final  Culture, blood (single)     Status: Abnormal   Collection Time: 09/24/15  9:20 AM  Result Value Ref Range Status   Specimen Description BLOOD LEFT ARM  10 ML IN [facility] BOTTLE  Final   Special Requests NONE  Final   Culture  Setup Time   Final    GRAM POSITIVE COCCI IN CLUSTERS ANAEROBIC BOTTLE ONLY CRITICAL RESULT CALLED TO, READ BACK BY AND VERIFIED WITH: Damian Leavell PHARMD AT 3428 09/25/15 L. BENFIELD    Culture (A)  Final    STAPHYLOCOCCUS AUREUS SUSCEPTIBILITIES PERFORMED ON PREVIOUS CULTURE WITHIN THE LAST 5 DAYS. Performed at [facility]    Report Status 09/27/2015 FINAL  Final  Culture, blood (single)     Status: Abnormal   Collection Time: 09/24/15  9:45 AM  Result Value Ref Range Status   Specimen  Description BLOOD PICC  10 ML IN [facility] BOTTLE  Final   Special Requests NONE  Final   Culture  Setup Time   Final    GRAM POSITIVE COCCI IN CLUSTERS ANAEROBIC BOTTLE ONLY CRITICAL RESULT CALLED TO, READ BACK BY AND VERIFIED WITH: Jerilynn Mages FRENS,PHARMD AT 7681 09/25/15 BY L BENFIELD Performed at [place] (A)  Final   Report Status 09/27/2015  FINAL  Final   Organism ID, Bacteria METHICILLIN RESISTANT STAPHYLOCOCCUS AUREUS  Final      Susceptibility   Methicillin resistant staphylococcus aureus - MIC*    CIPROFLOXACIN >=8 RESISTANT Resistant     ERYTHROMYCIN >=8 RESISTANT Resistant     GENTAMICIN <=0.5 SENSITIVE Sensitive     OXACILLIN >=4 RESISTANT Resistant     TETRACYCLINE <=1 SENSITIVE Sensitive     VANCOMYCIN 1 SENSITIVE Sensitive     TRIMETH/SULFA <=10 SENSITIVE Sensitive     CLINDAMYCIN <=0.25 SENSITIVE Sensitive     RIFAMPIN <=0.5 SENSITIVE Sensitive     Inducible Clindamycin NEGATIVE Sensitive     * METHICILLIN RESISTANT STAPHYLOCOCCUS AUREUS  Blood Culture ID Panel (Reflexed)     Status: Abnormal   Collection Time: 09/24/15  9:45 AM  Result Value Ref Range Status   Enterococcus species NOT DETECTED NOT DETECTED Final   Vancomycin resistance NOT DETECTED NOT DETECTED Final   Listeria monocytogenes NOT DETECTED NOT DETECTED Final   Staphylococcus species DETECTED (A) NOT DETECTED Final    Comment: CRITICAL RESULT CALLED TO, READ BACK BY AND VERIFIED WITH: M FRENS,PHARMD AT 3557 09/25/15 BY L BENFIELD    Staphylococcus aureus DETECTED (A) NOT DETECTED Final    Comment: CRITICAL RESULT CALLED TO, READ BACK BY AND VERIFIED WITH: M FRENS,PHARMD AT 3220 09/25/15 BY L BENFIELD    Methicillin resistance DETECTED (A) NOT DETECTED Final    Comment: CRITICAL RESULT CALLED TO, READ BACK BY AND VERIFIED WITH: Edson Snowball AT 2542 09/25/15 BY L BENFIELD    Streptococcus species NOT DETECTED NOT DETECTED Final   Streptococcus  agalactiae NOT DETECTED NOT DETECTED Final   Streptococcus pneumoniae NOT DETECTED NOT DETECTED Final   Streptococcus pyogenes NOT DETECTED NOT DETECTED Final   Acinetobacter baumannii NOT DETECTED NOT DETECTED Final   Enterobacteriaceae species NOT DETECTED NOT DETECTED Final   Enterobacter cloacae complex NOT DETECTED NOT DETECTED Final   Escherichia coli NOT DETECTED NOT DETECTED Final   Klebsiella oxytoca NOT DETECTED NOT DETECTED Final   Klebsiella pneumoniae NOT DETECTED NOT DETECTED Final   Proteus species NOT DETECTED NOT DETECTED Final   Serratia marcescens NOT DETECTED NOT DETECTED Final   Carbapenem resistance NOT DETECTED NOT DETECTED Final   Haemophilus influenzae NOT DETECTED NOT DETECTED Final   Neisseria meningitidis NOT DETECTED NOT DETECTED Final   Pseudomonas aeruginosa NOT DETECTED NOT DETECTED Final   Candida albicans NOT DETECTED NOT DETECTED Final   Candida glabrata NOT DETECTED NOT DETECTED Final   Candida krusei NOT DETECTED NOT DETECTED Final   Candida parapsilosis NOT DETECTED NOT DETECTED Final   Candida tropicalis NOT DETECTED NOT DETECTED Final    Comment: Performed at [facility]  Culture, Urine     Status: Abnormal   Collection Time: 09/24/15  2:55 PM  Result Value Ref Range Status   Specimen Description URINE, RANDOM  Final   Special Requests NONE  Final   Culture (A)  Final    <10,000 COLONIES/mL INSIGNIFICANT GROWTH Performed at [facility]    Report Status 09/25/2015 FINAL  Final  Culture, blood (Routine X 2) w Reflex to ID Panel     Status: None (Preliminary result)   Collection Time: 09/26/15  1:11 PM  Result Value Ref Range Status   Specimen Description BLOOD RIGHT ARM  Final   Special Requests IN PEDIATRIC BOTTLE 0.5 CC  Final   Culture   Final    NO GROWTH < 24 HOURS Performed at [facility]    Report Status PENDING  Incomplete  Culture, blood (Routine X 2) w Reflex to ID Panel     Status: None (Preliminary  result)   Collection Time: 09/26/15  1:11 PM  Result Value Ref Range Status   Specimen Description BLOOD LEFT HAND  Final   Special Requests IN PEDIATRIC BOTTLE 1 CC  Final   Culture   Final    NO GROWTH < 24 HOURS Performed at [facility]    Report Status PENDING  Incomplete  MRSA PCR Screening     Status: None   Collection Time: 09/28/15  7:31 AM  Result Value Ref Range Status   MRSA by PCR NEGATIVE NEGATIVE Final    Comment:        The GeneXpert MRSA Assay (FDA approved for NASAL specimens only), is one component of a comprehensive MRSA colonization surveillance program. It is not intended to diagnose MRSA infection nor to guide or monitor treatment for MRSA infections.          Radiology Studies: No results found.      Scheduled Meds: . antiseptic oral rinse  7 mL Mouth Rinse q12n4p  . chlorhexidine  15 mL Mouth Rinse BID  . dexamethasone  2 mg Oral Q12H  . docusate sodium  100 mg Oral BID  . feeding supplement (PRO-STAT SUGAR FREE 64)  30 mL Oral TID  . fentaNYL  50 mcg Transdermal Q72H  . gabapentin  100 mg Oral QHS  . lidocaine  1 patch Transdermal Q24H  . pantoprazole  40 mg Oral Daily  . polyethylene glycol  17 g Oral Daily  . sodium chloride  1,000 mL Intravenous Once  . sodium chloride  1,000 mL Intravenous Once  . sodium chloride flush  3 mL Intravenous Q12H  . vancomycin  1,000 mg Intravenous Q24H  . warfarin  2 mg Oral ONCE-1800  . Warfarin - Pharmacist Dosing Inpatient   Does not apply q1800   Continuous Infusions:     LOS: 4 days    Time spent: 35 minutes.     Florencia Reasons, MD PhD [facility] Pager [phone]  If 7PM-7AM, please contact night-coverage [email or web address] Password [email or web address] 09/28/2015, 11:52 AM

## 2015-09-28 NOTE — Progress Notes (Signed)
[facility] Therapy Treatment Record Phone [phone]   Radiation Therapy was administered to [name] on: 09/28/2015  5:25 PM and was treatment # 3 out of a planned course of 5 treatments.  Radiation Treatment  1). Beam photons with 6-10 energy and Photons 10-19 MeV  2). Brachytherapy None  3). Stereotactic Radiosurgery None  4). Other Radiation None     Prapti Grussing, [place], Rad Therap

## 2015-09-28 NOTE — Consult Note (Signed)
   [facility] CM Inpatient Consult   09/28/2015  CARLIE KARABA [birth year] [id number]     Patient screened for [facility] services. Went to bedside to offer and explain [facility] Care Management program with patient. Confirmed Ms. [employer] lives in [place]. Discussed she could potentially be followed telephonically without home visits since she lives in [place]. Ms. Laure Kidney declined [facility] Management follow up. She states " if you are not in [facility], what would I need you for, I do not want to cross over services that I have in [facility]".  Accepted [facility] Care Management brochure with contact information to call in future if changes mind. Will make inpatient RNCM aware that patient declined [facility] Management program follow up.   Marthenia Rolling, MSN-Ed, RN,BSN [facility] Liaison [phone]

## 2015-09-28 NOTE — Care Management Note (Addendum)
Case Management Note  Patient Details  Name: [name] MRN: [id number] Date of Birth: [birth year]  Subjective/Objective:                  MRSA bacteremia Action/Plan: Discharge planning Expected Discharge Date:  10/03/15               Expected Discharge Plan:  [place]  In-House Referral:     Discharge planning Services  CM Consult  Post Acute Care Choice:  Durable Medical Equipment Choice offered to:     DME Arranged:  Oxygen DME Agency:     HH Arranged:  HHRN/aide/SW HH Agency:  [place]  Status of Service:  In process, will continue to follow  Medicare Important Message Given:  Yes Date Medicare IM Given:    Medicare IM give by:    Date Additional Medicare IM Given:    Additional Medicare Important Message give by:     If discussed at [place] of Stay Meetings, dates discussed:    Additional Comments: CM met with pt in room with pt and [name].  With pt's permission, [name] [phone] and will be contact and decision maker for pt. Pt will be going home to her [place], [place] home and will have home IV ABX.  CM called [name] of [facility] who states they will provide the pharmacy for IV ABX and will partner with a home health agency for additional disciplines.  CM mad MD aware and MD states she will place Face to Face, have IV ABX prescriptions, and place HHRN/aide/SW for the purpose of providing [name] with as many local community resources as possible.  [name] states she will be Cytogeneticist to asst with the day-to-day care of pt and expresses appreciation of home SW to provide resource information once in [place].  [name] and pt state understanding PPA CMAs are an out-of-pocket expense and usually run 25-30/hour; this does not seem to be a financial problem for family.  [name] is also seeking guidance from [facility] agency for AL services for care once [name] goes back home to [facility].  Arrangements  for discharge include: IV ABX- will need prescripitons and [name] has accepted referral; Home O2 (has not been addressed as of yet- will need an order and a pulmonary saturation note); HHRN/aide/SW orders have been requested of MD  CM will continue to follow.   Carolyn Catholic, RN 09/28/2015, 5:03 PM

## 2015-09-28 NOTE — Progress Notes (Signed)
Patient ID: [name], female   DOB: [birth year], 70 y.o.   MRN: [id number]         [facility] for Infectious Disease    Date of Admission:  09/24/2015           Day 14 vancomycin  Principal Problem:   MRSA bacteremia Active Problems:   GERD   Personal history of thrombophlebitis   Breast cancer metastasized to bone (HCC)   AKI (acute kidney injury) ([place])   Essential hypertension   Protein-calorie malnutrition, severe (HCC)   Pain from bone metastases (HCC)   Generalized abdominal pain   Elevated LFTs   Rib pain on left side   Acute septic pulmonary embolism (HCC)   Acute respiratory failure with hypoxia (HCC)   Leukocytosis   Necrotizing pneumonia ([place])   PICC line infection   Goals of care, counseling/discussion   Cavitary pneumonia   . antiseptic oral rinse  7 mL Mouth Rinse q12n4p  . chlorhexidine  15 mL Mouth Rinse BID  . dexamethasone  2 mg Oral Q12H  . docusate sodium  100 mg Oral BID  . feeding supplement (PRO-STAT SUGAR FREE 64)  30 mL Oral TID  . fentaNYL  50 mcg Transdermal Q72H  . gabapentin  100 mg Oral QHS  . lidocaine  1 patch Transdermal Q24H  . pantoprazole  40 mg Oral Daily  . polyethylene glycol  17 g Oral Daily  . sodium chloride  1,000 mL Intravenous Once  . sodium chloride  1,000 mL Intravenous Once  . sodium chloride flush  3 mL Intravenous Q12H  . vancomycin  1,000 mg Intravenous Q24H  . warfarin  2 mg Oral ONCE-1800  . Warfarin - Pharmacist Dosing Inpatient   Does not apply q1800    SUBJECTIVE: She is feeling better. She is eager to get out and get her strength back so she can do more things for herself. She has a dry cough that is new. She feels like the supplemental oxygen is making her feel better. She tells me that she does not have a good understanding of what is making her so sick.  Review of Systems: Review of Systems  Constitutional: Positive for malaise/fatigue. Negative for fever, chills and diaphoresis.    Respiratory: Positive for cough and shortness of breath. Negative for sputum production.   Cardiovascular: Negative for chest pain.  Skin: Negative for rash.  Neurological: Positive for weakness.    Past Medical History  Diagnosis Date  . Cancer (HCC)     RBreast, Lower back , Under l Arm  . Hypertension   . GERD (gastroesophageal reflux disease)   . Breast cancer metastasized to bone ([place]) 04/30/2011  . Radiation 01/15/2012    Lumbosacral spine 3500 cGy 14  fx  . Swollen R ankle 08/10/2012  . Fracture of ankle, medial malleolus, right, closed 08/10/2012  . Tubular adenoma 2011  . Hiatal hernia 2011  . Schatzki's ring   . Anorexia 03/02/2013  . Weight loss due to medication 03/02/2013  . C. difficile colitis   . Pneumonia   . 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 Smoking status: Former Smoker    Quit date: 11/22/1982  . Smokeless tobacco: Never Used     Comment: Quit smoking x 32 years  . Alcohol Use: No    Family History  Problem Relation Age of Onset  . Cancer Mother     Colon  .  Cancer Father     Lung, Mouth,leg   Allergies  Allergen Reactions  . Aspirin Nausea And Vomiting  . Boost Pudding [Nutritional Supplements] Diarrhea  . Percodan [Oxycodone-Aspirin] Nausea Only  . Codeine Nausea Only    OBJECTIVE: Filed Vitals:   09/27/15 2130 09/27/15 2334 09/28/15 0531 09/28/15 1402  BP: 124/72 120/60 127/68 98/55  Pulse: 99 97 80 72  Temp: 97.6 F (36.4 C)  97.7 F (36.5 C) 97.9 F (36.6 C)  TempSrc: Oral  Axillary Oral  Resp: 18  18 17   Height:      Weight:      SpO2: 94%  95% 96%   Body mass index is 22.71 kg/(m^2).  Physical Exam  Constitutional:  She is sitting up in bed. She appears alert and comfortable. She got quite frustrated on several occasions during the exam when she could not remember what he calls something. She did not know what to call the supplemental oxygen tubing that was in her nose. Her daughter is  at the bedside.  HENT:  Mouth/Throat: No oropharyngeal exudate.  Eyes: Conjunctivae are normal.  Cardiovascular: Normal rate and regular rhythm.   No murmur heard. Pulmonary/Chest: Effort normal and breath sounds normal. She has no wheezes. She has no rales.  Abdominal: Soft. There is no tenderness.  Neurological: She is alert.  Skin: No rash noted.    Lab Results Lab Results  Component Value Date   WBC 9.8 09/28/2015   HGB 10.1* 09/28/2015   HCT 30.4* 09/28/2015   MCV 97.4 09/28/2015   PLT 197 09/28/2015    Lab Results  Component Value Date   CREATININE 0.71 09/28/2015   BUN 25* 09/28/2015   NA 139 09/28/2015   K 4.2 09/28/2015   CL 103 09/28/2015   CO2 30 09/28/2015    Lab Results  Component Value Date   ALT 28 09/25/2015   AST 15 09/25/2015   ALKPHOS 121 09/25/2015   BILITOT 0.5 09/25/2015     Microbiology: Recent Results (from the past 240 hour(s))  Culture, blood (Routine X 2) w Reflex to ID Panel     Status: None   Collection Time: 09/21/15  5:35 PM  Result Value Ref Range Status   Specimen Description BLOOD RIGHT ARM  Final   Special Requests BOTTLES DRAWN AEROBIC AND ANAEROBIC  5CC, 10CC  Final   Culture   Final    NO GROWTH 5 DAYS Performed at [facility]    Report Status 09/26/2015 FINAL  Final  Culture, blood (Routine X 2) w Reflex to ID Panel     Status: None   Collection Time: 09/21/15  5:45 PM  Result Value Ref Range Status   Specimen Description BLOOD RIGHT ARM  Final   Special Requests IN PEDIATRIC BOTTLE  4CC  Final   Culture   Final    NO GROWTH 5 DAYS Performed at [facility]    Report Status 09/26/2015 FINAL  Final  Culture, blood (single)     Status: Abnormal   Collection Time: 09/24/15  9:20 AM  Result Value Ref Range Status   Specimen Description BLOOD LEFT ARM  10 ML IN [facility] BOTTLE  Final   Special Requests NONE  Final   Culture  Setup Time   Final    GRAM POSITIVE COCCI IN CLUSTERS ANAEROBIC BOTTLE  ONLY CRITICAL RESULT CALLED TO, READ BACK BY AND VERIFIED WITH: Damian Leavell PHARMD AT [id number] 09/25/15 L. BENFIELD    Culture (A)  Final    STAPHYLOCOCCUS AUREUS SUSCEPTIBILITIES PERFORMED ON PREVIOUS CULTURE WITHIN THE LAST 5 DAYS. Performed at [facility]    Report Status 09/27/2015 FINAL  Final  Culture, blood (single)     Status: Abnormal   Collection Time: 09/24/15  9:45 AM  Result Value Ref Range Status   Specimen Description BLOOD PICC  10 ML IN [facility] BOTTLE  Final   Special Requests NONE  Final   Culture  Setup Time   Final    GRAM POSITIVE COCCI IN CLUSTERS ANAEROBIC BOTTLE ONLY CRITICAL RESULT CALLED TO, READ BACK BY AND VERIFIED WITH: Jerilynn Mages FRENS,PHARMD AT [id number] 09/25/15 BY L BENFIELD Performed at [place] (A)  Final   Report Status 09/27/2015 FINAL  Final   Organism ID, Bacteria METHICILLIN RESISTANT STAPHYLOCOCCUS AUREUS  Final      Susceptibility   Methicillin resistant staphylococcus aureus - MIC*    CIPROFLOXACIN >=8 RESISTANT Resistant     ERYTHROMYCIN >=8 RESISTANT Resistant     GENTAMICIN <=0.5 SENSITIVE Sensitive     OXACILLIN >=4 RESISTANT Resistant     TETRACYCLINE <=1 SENSITIVE Sensitive     VANCOMYCIN 1 SENSITIVE Sensitive     TRIMETH/SULFA <=10 SENSITIVE Sensitive     CLINDAMYCIN <=0.25 SENSITIVE Sensitive     RIFAMPIN <=0.5 SENSITIVE Sensitive     Inducible Clindamycin NEGATIVE Sensitive     * METHICILLIN RESISTANT STAPHYLOCOCCUS AUREUS  Blood Culture ID Panel (Reflexed)     Status: Abnormal   Collection Time: 09/24/15  9:45 AM  Result Value Ref Range Status   Enterococcus species NOT DETECTED NOT DETECTED Final   Vancomycin resistance NOT DETECTED NOT DETECTED Final   Listeria monocytogenes NOT DETECTED NOT DETECTED Final   Staphylococcus species DETECTED (A) NOT DETECTED Final    Comment: CRITICAL RESULT CALLED TO, READ BACK BY AND VERIFIED WITH: M FRENS,PHARMD AT [id number] 09/25/15 BY L  BENFIELD    Staphylococcus aureus DETECTED (A) NOT DETECTED Final    Comment: CRITICAL RESULT CALLED TO, READ BACK BY AND VERIFIED WITH: M FRENS,PHARMD AT [id number] 09/25/15 BY L BENFIELD    Methicillin resistance DETECTED (A) NOT DETECTED Final    Comment: CRITICAL RESULT CALLED TO, READ BACK BY AND VERIFIED WITH: M FRENS,PHARMD AT [id number] 09/25/15 BY L BENFIELD    Streptococcus species NOT DETECTED NOT DETECTED Final   Streptococcus agalactiae NOT DETECTED NOT DETECTED Final   Streptococcus pneumoniae NOT DETECTED NOT DETECTED Final   Streptococcus pyogenes NOT DETECTED NOT DETECTED Final   Acinetobacter baumannii NOT DETECTED NOT DETECTED Final   Enterobacteriaceae species NOT DETECTED NOT DETECTED Final   Enterobacter cloacae complex NOT DETECTED NOT DETECTED Final   Escherichia coli NOT DETECTED NOT DETECTED Final   Klebsiella oxytoca NOT DETECTED NOT DETECTED Final   Klebsiella pneumoniae NOT DETECTED NOT DETECTED Final   Proteus species NOT DETECTED NOT DETECTED Final   Serratia marcescens NOT DETECTED NOT DETECTED Final   Carbapenem resistance NOT DETECTED NOT DETECTED Final   Haemophilus influenzae NOT DETECTED NOT DETECTED Final   Neisseria meningitidis NOT DETECTED NOT DETECTED Final   Pseudomonas aeruginosa NOT DETECTED NOT DETECTED Final   Candida albicans NOT DETECTED NOT DETECTED Final   Candida glabrata NOT DETECTED NOT DETECTED Final   Candida krusei NOT DETECTED NOT DETECTED Final   Candida parapsilosis NOT DETECTED NOT DETECTED Final   Candida tropicalis NOT DETECTED NOT DETECTED Final    Comment: Performed at [facility]  Culture, Urine     Status: Abnormal   Collection Time: 09/24/15  2:55 PM  Result Value Ref Range Status   Specimen Description URINE, RANDOM  Final   Special Requests NONE  Final   Culture (A)  Final    <10,000 COLONIES/mL INSIGNIFICANT GROWTH Performed at [facility]    Report Status 09/25/2015 FINAL  Final  Culture, blood  (Routine X 2) w Reflex to ID Panel     Status: None (Preliminary result)   Collection Time: 09/26/15  1:11 PM  Result Value Ref Range Status   Specimen Description BLOOD RIGHT ARM  Final   Special Requests IN PEDIATRIC BOTTLE 0.5 CC  Final   Culture   Final    NO GROWTH < 24 HOURS Performed at [facility]    Report Status PENDING  Incomplete  Culture, blood (Routine X 2) w Reflex to ID Panel     Status: None (Preliminary result)   Collection Time: 09/26/15  1:11 PM  Result Value Ref Range Status   Specimen Description BLOOD LEFT HAND  Final   Special Requests IN PEDIATRIC BOTTLE 1 CC  Final   Culture   Final    NO GROWTH < 24 HOURS Performed at [facility]    Report Status PENDING  Incomplete  MRSA PCR Screening     Status: None   Collection Time: 09/28/15  7:31 AM  Result Value Ref Range Status   MRSA by PCR NEGATIVE NEGATIVE Final    Comment:        The GeneXpert MRSA Assay (FDA approved for NASAL specimens only), is one component of a comprehensive MRSA colonization surveillance program. It is not intended to diagnose MRSA infection nor to guide or monitor treatment for MRSA infections.      ASSESSMENT: She appears to be improving on therapy for her persistent MRSA bacteremia. Her most recent blood cultures are negative at 48 hours. It is best to wait a few more days before central line placement. If she needs a Port-A-Cath for her upcoming chemotherapy I would go ahead and have that placed in time for her to start chemotherapy on 10/02/2015. She had no evidence of endocarditis on TEE during her first hospitalization. Although she has some progressive, cavitary infiltrates that were not noted during her first hospitalization I do not feel it is necessary to repeat a TEE now. She will need at least 4 weeks of vancomycin therapy following negative blood cultures.   PLAN: 1. Continue vancomycin 2. Consider Port-A-Cath placement in the next few days if repeat  blood cultures remain negative  Michel Bickers, MD [facility] [place] [phone] pager   [phone] cell 09/28/2015, 4:47 PM

## 2015-09-28 NOTE — Progress Notes (Signed)
Physical Therapy Treatment Patient Details Name: [name] MRN: [id number] DOB: December 18, 1945 Today's Date: 09/28/2015    History of Present Illness 70 yo female admitted with Pna from [facility] in [place], SIRS (recent adm with same--pt went to [facility] and left AMA). Hx of breast cancer with bony mets, FTT, HTN    PT Comments    Continuing to participate well with therapy. O2 sats 82% on RA during ambulation; recovered to 91% once placed back on [place] O2. Pt tolerated distance well. Recommend daily ambulation with nursing in addition to PT sessions.   Follow Up Recommendations  Home health PT;Supervision - Intermittent (may not need if pt continues to progress well)     Equipment Recommendations  Rolling walker with 5" wheels    Recommendations for Other Services       Precautions / Restrictions Precautions Precautions: Fall Precaution Comments: monitor sats Restrictions Weight Bearing Restrictions: No    Mobility  Bed Mobility Overal bed mobility: Needs Assistance Bed Mobility: Supine to Sit     Supine to sit: Supervision     General bed mobility comments: HOB elevated, no physical assist  Transfers Overall transfer level: Needs assistance Equipment used: Rolling walker (2 wheeled) Transfers: Sit to/from Stand Sit to Stand: Supervision         General transfer comment:  for safety  Ambulation/Gait Ambulation/Gait assistance: Min guard Ambulation Distance (Feet): 175 Feet Assistive device: Rolling walker (2 wheeled) Gait Pattern/deviations: Step-through pattern;Decreased stride length     General Gait Details: gait is steady.  for safety.    Stairs            Wheelchair Mobility    Modified Rankin (Stroke Patients Only)       Balance                                    Cognition Arousal/Alertness: Awake/alert Behavior During Therapy: Agitated;Flat affect Overall Cognitive Status: Within Functional Limits  for tasks assessed                      Exercises      General Comments        Pertinent Vitals/Pain Pain Assessment: Faces Faces Pain Scale: Hurts even more Pain Location: hands, R LE  Pain Intervention(s): Monitored during session;Repositioned    Home Living                      Prior Function            PT Goals (current goals can now be found in the care plan section) Progress towards PT goals: Progressing toward goals    Frequency  Min 3X/week    PT Plan Current plan remains appropriate    Co-evaluation             End of Session   Activity Tolerance: Patient tolerated treatment well Patient left: in chair;with call bell/phone within reach;with chair alarm set     Time: 1130-1146 PT Time Calculation (min) (ACUTE ONLY): 16 min  Charges:  $Gait Training: 8-22 mins                    G Codes:      Weston Anna, MPT Pager: [phone]

## 2015-09-28 NOTE — Progress Notes (Signed)
ANTICOAGULATION CONSULT NOTE - Follow Up  Pharmacy Consult for Warfarin Indication: History extensive superficial vein thrombosis  Allergies  Allergen Reactions  . Aspirin Nausea And Vomiting  . Boost Pudding [Nutritional Supplements] Diarrhea  . Percodan [Oxycodone-Aspirin] Nausea Only  . Codeine Nausea Only    Patient Measurements: Height: 5\' 2"  (157.5 cm) Weight: 124 lb 3.2 oz (56.337 kg) IBW/kg (Calculated) : 50.1  Vital Signs: Temp: 97.7 F (36.5 C) (06/01 0531) Temp Source: Axillary (06/01 0531) BP: 127/68 mmHg (06/01 0531) Pulse Rate: 80 (06/01 0531)  Labs:  Recent Labs  09/26/15 0423 09/27/15 0432 09/28/15 0439  HGB 10.2* 10.0* 10.1*  HCT 30.5* 30.0* 30.4*  PLT 173 181 197  LABPROT 30.1* 21.3* 19.9*  INR 2.93* 1.86* 1.76*  CREATININE 0.54 0.62 0.71    Estimated Creatinine Clearance: 51 mL/min (by C-G formula based on Cr of 0.71).   Medications:  Scheduled:  . antiseptic oral rinse  7 mL Mouth Rinse q12n4p  . chlorhexidine  15 mL Mouth Rinse BID  . dexamethasone  2 mg Oral Q12H  . docusate sodium  100 mg Oral BID  . feeding supplement (PRO-STAT SUGAR FREE 64)  30 mL Oral TID  . fentaNYL  50 mcg Transdermal Q72H  . gabapentin  100 mg Oral QHS  . lidocaine  1 patch Transdermal Q24H  . pantoprazole  40 mg Oral Daily  . polyethylene glycol  17 g Oral Daily  . sodium chloride  1,000 mL Intravenous Once  . sodium chloride  1,000 mL Intravenous Once  . sodium chloride flush  3 mL Intravenous Q12H  . vancomycin  1,000 mg Intravenous Q24H  . Warfarin - Pharmacist Dosing Inpatient   Does not apply q1800   Infusions:    PRN: acetaminophen **OR** acetaminophen, magic mouthwash w/lidocaine, morphine, morphine injection, ondansetron **OR** ondansetron (ZOFRAN) IV, polyvinyl alcohol, sodium chloride flush  Assessment: 70 y.o. woman with a history of metastatic breast cancer, bone pain anticipating palliative radiation therapy, recently discharged 5/26 then  readmitted 5/28 after leaving SNF AMA.  She is on chronic warfarin therapy for hx extensive superficial vein thrombosis. INR is essentially therapeutic on admission (1.96) and Pharmacy is consulted to continue dosing while inpatient.  Dose prior to 5/18 admission reported as 2mg  daily except 4mg  Fri, Sat, Sun Recent dosing history in Epic reviewed: doses ranged from 1mg  to 4mg  to achieve therapeutic INR  Today, 09/28/2015  INR subtherapeutic at 1.76 after holding warfarin x 2 doses 5/29 and 5/30 d/t anemia noted at time of re-admission  CBC: Hgb stable x 3 days following Hgb = 6.8 5/29 s/p PRBC  Diet: Eating 50-100% meals  No drug interactions  SCDs charted  Goal of Therapy:  INR 2-3   Plan:   Warfarin 2mg  po x1 tonight - conservative dosing per request of TR[facility]  Daily PT/INR  Doreene Eland, PharmD, BCPS.   Pager: [phone] 09/28/2015 7:46 AM

## 2015-09-29 ENCOUNTER — Ambulatory Visit
Admit: 2015-09-29 | Discharge: 2015-09-29 | Disposition: A | Discharge status: Home / Self Care | Payer: Medicare PPO | Attending: Radiation Oncology | Admitting: Radiation Oncology

## 2015-09-29 ENCOUNTER — Other Ambulatory Visit: Payer: Self-pay | Admitting: Oncology

## 2015-09-29 ENCOUNTER — Telehealth: Payer: Self-pay | Admitting: *Deleted

## 2015-09-29 DIAGNOSIS — R791 Abnormal coagulation profile: Secondary | ICD-10-CM

## 2015-09-29 DIAGNOSIS — I2690 Septic pulmonary embolism without acute cor pulmonale: Secondary | ICD-10-CM

## 2015-09-29 LAB — PROTIME-INR
INR: 2.2 — AB (ref 0.00–1.49)
PROTHROMBIN TIME: 23.5 s — AB (ref 11.6–15.2)

## 2015-09-29 MED ORDER — WARFARIN SODIUM 2 MG PO TABS
2.0000 mg | ORAL_TABLET | Freq: Once | ORAL | Status: DC
  Filled 2015-09-29: qty 1

## 2015-09-29 MED ORDER — WARFARIN SODIUM 2.5 MG PO TABS: 2.5000 mg | ORAL_TABLET | Freq: Once | ORAL | Status: DC

## 2015-09-29 NOTE — Progress Notes (Signed)
ANTICOAGULATION CONSULT NOTE - Follow Up  Pharmacy Consult for Warfarin Indication: History extensive superficial vein thrombosis  Allergies  Allergen Reactions  . Aspirin Nausea And Vomiting  . Boost Pudding [Nutritional Supplements] Diarrhea  . Percodan [Oxycodone-Aspirin] Nausea Only  . Codeine Nausea Only    Patient Measurements: Height: 5\' 2"  (157.5 cm) Weight: 124 lb 3.2 oz (56.337 kg) IBW/kg (Calculated) : 50.1  Vital Signs: Temp: 98.3 F (36.8 C) (06/02 0613) Temp Source: Oral (06/02 [id number]) BP: 121/49 mmHg (06/02 0613) Pulse Rate: 78 (06/02 0613)  Labs:  Recent Labs  09/27/15 0432 09/28/15 0439 09/29/15 0420  HGB 10.0* 10.1*  --   HCT 30.0* 30.4*  --   PLT 181 197  --   LABPROT 21.3* 19.9* 23.5*  INR 1.86* 1.76* 2.20*  CREATININE 0.62 0.71  --     Estimated Creatinine Clearance: 51 mL/min (by C-G formula based on Cr of 0.71).   Medications:  Scheduled:  . antiseptic oral rinse  7 mL Mouth Rinse q12n4p  . chlorhexidine  15 mL Mouth Rinse BID  . dexamethasone  2 mg Oral Q12H  . docusate sodium  100 mg Oral BID  . feeding supplement (PRO-STAT SUGAR FREE 64)  30 mL Oral TID  . fentaNYL  50 mcg Transdermal Q72H  . gabapentin  100 mg Oral QHS  . lidocaine  1 patch Transdermal Q24H  . pantoprazole  40 mg Oral Daily  . polyethylene glycol  17 g Oral Daily  . sodium chloride  1,000 mL Intravenous Once  . sodium chloride  1,000 mL Intravenous Once  . sodium chloride flush  3 mL Intravenous Q12H  . vancomycin  1,000 mg Intravenous Q24H  . Warfarin - Pharmacist Dosing Inpatient   Does not apply q1800   Infusions:    PRN: acetaminophen **OR** acetaminophen, magic mouthwash w/lidocaine, morphine, morphine injection, ondansetron **OR** ondansetron (ZOFRAN) IV, polyvinyl alcohol, sodium chloride flush  Assessment: 70 y.o. woman with a history of metastatic breast cancer, bone pain anticipating palliative radiation therapy, recently discharged 5/26 then  readmitted 5/28 after leaving SNF AMA.  She is on chronic warfarin therapy for hx extensive superficial vein thrombosis. INR is essentially therapeutic on admission (1.96) and Pharmacy is consulted to continue dosing while inpatient.  Dose prior to 5/18 admission reported as 2mg  daily except 4mg  Fri, Sat, Sun Recent dosing history in Epic reviewed: doses ranged from 1mg  to 4mg  to achieve therapeutic INR  Today, 09/29/2015  INR now therapeutic after holding x 2 doses 5/29 and 5/30 due to anemia, then resuming on 5/31 4mg , 6/1 2mg .   MD would like conservative dosing.   CBC: Hgb stable x 3 days following Hgb = 6.8 5/29 s/p PRBC  Diet: Eating 50-100% meals  No drug interactions  SCDs charted  Goal of Therapy:  INR 2-3   Plan:   Warfarin 2mg  po x1 tonight  Daily PT/INR  CBC ordered for 6/3 AM  Ralene Bathe, PharmD, BCPS 09/29/2015, 9:26 AM  Pager: [phone]

## 2015-09-29 NOTE — Progress Notes (Signed)
[facility]  Confirmed with Marcie Bal with [facility] that her team can provide Home Health services for Ms. Vittone since pt will likely not DC until Monday.  I advised I will update her on Monday regarding DC status and plans. [facility] [place] # [phone]  Fax# [phone].   If patient discharges after hours, please call [phone].   Larry Sierras 09/29/2015, 1:37 PM

## 2015-09-29 NOTE — Progress Notes (Signed)
PROGRESS NOTE    [name]  SWF:[id number] DOB: 21-Jun-1945 DOA: 09/24/2015 PCP: Curlene Labrum, MD    Brief Narrative: [name] is a 70 y.o. woman with a history of metastatic breast cancer, bone pain anticipating palliative radiation therapy, severe protein calorie malnutrition with adult failure to thrive, HTN, and GERD who was just discharged from this hospital on 09/22/2015 and sent to SNF for management of MRSA bacteremia. The plan was for her to complete a 4 week course of IV vancomycin from 09/19/2015. She left SNF AMA at some point yesterday, reporting that she was not receiving pain medications or her antibiotics as ordered. She was taken home by a friend (without access to supplemental oxygen). She became profoundly weak and was found down. She insists that she did no lose consciousness; she was just too weak to get herself up. She complains of chronic pain at this point. She had left sided pleuritic chest pain that improved with oxygen. She reports that she was only SOB when she was off of oxygen. Again, no LOC. No evidence of acute blood loss. No nausea or vomiting ("I don't know why they gave me Zofran at the other hospital."). 1-2 loose stools daily.  ED Course: The patient was evaluated in the ED at [facility]. Pertinent findings include new leukocytosis compared to discharge and evidence of acute kidney injury. No documented fever. Alk phos remains elevated, but LFTs have otherwise improved. Protein levels remain low. CT chest interpreted as multifocal pneumonia; I suspect this represents the same process that was identified here by chest CT on Sep 20, 2015.   Assessment & Plan:   Principal Problem:   MRSA bacteremia Active Problems:   GERD   Personal history of thrombophlebitis   Breast cancer metastasized to bone (HCC)   AKI (acute kidney injury) ([place])   Essential hypertension   Protein-calorie malnutrition, severe (HCC)   Pain from bone  metastases (HCC)   Generalized abdominal pain   Elevated LFTs   Rib pain on left side   Acute septic pulmonary embolism (HCC)   Acute respiratory failure with hypoxia (HCC)   Leukocytosis   Necrotizing pneumonia (HCC)   PICC line infection   Goals of care, counseling/discussion   Cavitary pneumonia  MRSA bacteremia with HCAP and probable septic embolic to the lung with new leukocytosis compared to discharge -- urine culture insignificant growth, blood cultures from 5/28 +MRSA 2/2, repeat blood culture from 5/30, no growth continue  -picc line placed from previously hospitalization removed on 5/30 -continue Vancomycin which was started from admission, she received levaquin, and zosyn initially on admission -TEE no vegetation on 5/25 from previous hospitalization, no need to repeat TEE, port placement early next week per ID recommendation, plan for iv abx till 6/27-appreciate ID input Coumadin held since 6/2 in preparation for port placement on 6/5, need to be npo aftermidnight on Sunday.   Hypoxia: o2 dropped to 82 on room air  While ambulating, will need arrange home o2.  History of metastatic breast cancer with bone pain: Recent PET scan on 08/31/15 showed numerous osseous mets-followed by Dr [name]. Recent MRI entire spine on 4/28 showed multiple osseous spinal mets with no cord compression. Plan for port placement and chemotherapy postponed with last admission due to acute illness.  Palliative radiation therapy anticipated for skull, left rib cage, and right tibial lesions. Discussed with Dr [name] patient will received localized radiation, ok to proceed to prevents complication meningion carcinomatosis.  pateint to received total  of 5 days xrt, Last xrt on 6/5 per rad onc. Dr [name] plan to start first chemo on 6/12  History of abnormal LFTs --Overall improved. No evidence of obvious liver mets on recent imaging. Continue to monitor.  Chronic pain --Fentanyl patch, MSIR  prn (may need to change to dilaudid if renal failure persists)  GERD --Continue PPI  AKI with relative hypotension --Will aggressively hydrate and monitor for now --If renal function does not improve, will need further work-up --Pharmacy to renally dose medications  Acute on Chronic anemia --Hgb drop to 6,8 0n 5/29. Received   2 units PRBC 5-30. Hb increase to 10/  Hold coumadin held on 5/29 and 5/30, hgb has been stable, coumadin resumed on 5/32.  Stool guaiac pending collection.  Chronic anticoagulation for chronic, extensive superficial thrombophlebitis coumadin held on 5/29 and 5/30, due to drop of hemoglobin on 5/29.  hgb stabilized after prbc transfusion, no overt sign of bleed, coumadin resumed on 5/31 target on lower end of therapeutic range, monitor hgb, inr, Patient does has bilateral lower extremity edema, venous US no dvt, trial of lasix on 5/31 Coumadin held  Since 6/2 in ancitipating port placement on 6/5.  History of hypertension but now showing relative hypotension --s/p IV fluids.  --Currently asymptomatic  Anorexia: Likely secondary to underlying malignancy worsened by pneumonia.   Protein-calorie malnutrition, severe: Continue supplements    DVT prophylaxis: on coumadin  Code Status: DNR Family Communication: care discussed with patient and her cousin in the room Disposition Plan: home with home health, home oxygen early next week   Consultants:   ID  Oncology  Rad onc  IR   Procedures:   Removal of picc line on 5/30  Medi Port placement on 6/5  Antimicrobials:   Vancomycin    Subjective: Sitting in chair, feeling better,   Objective: Filed Vitals:   09/28/15 1402 09/28/15 2110 09/29/15 0613 09/29/15 1407  BP: 98/55 105/61 121/49 112/67  Pulse: 72 82 78 73  Temp: 97.9 F (36.6 C) 98.2 F (36.8 C) 98.3 F (36.8 C) 98 F (36.7 C)  TempSrc: Oral Oral Oral Oral  Resp: _0 Height:      Weight:      SpO2: 96% 95% 93%  99%    Intake/Output Summary (Last 24 hours) at 09/29/15 1837 Last data filed at 09/29/15 1050  Gross per 24 hour  Intake    240 ml  Output      2 ml  Net    238 ml   Filed Weights   09/24/15 0605  Weight: 56.337 kg (124 lb 3.2 oz)    Examination:  General exam: Appears calm and comfortable  Respiratory system: Clear to auscultation. Respiratory effort normal. Cardiovascular system: S1 & S2 heard, RRR. No JVD, murmurs, rubs, gallops or clicks. Less  pedal edema. Gastrointestinal system: Abdomen is nondistended, soft and nontender. No organomegaly or masses felt. Normal bowel sounds heard. Central nervous system: Alert and oriented. No focal neurological deficits. Extremities: Symmetric 5 x 5 power.  Skin: No rashes, lesions or ulcers     Data Reviewed: I have personally reviewed following labs and imaging studies  CBC:  Recent Labs Lab 09/25/15 0500 09/26/15 0423 09/27/15 0432 09/28/15 0439  WBC 10.6* 11.2* 9.7 9.8  HGB 6.8* 10.2* 10.0* 10.1*  HCT 21.0* 30.5* 30.0* 30.4*  MCV 102.4* 96.8 96.8 97.4  PLT 186 173 181 921   Basic Metabolic Panel:  Recent Labs Lab 09/25/15 0500 09/26/15  0423 09/27/15 0432 09/28/15 0439  NA 140 138 139 139  K 4.4 4.3 3.5 4.2  CL 109 106 104 103  CO2 _0 GLUCOSE 120* 101* 114* 126*  BUN 30* 21* 20 25*  CREATININE 0.82 0.54 0.62 0.71  CALCIUM 7.6* 8.6* 8.1* 7.6*  MG  --   --   --  1.3*   GFR: Estimated Creatinine Clearance: 51 mL/min (by C-G formula based on Cr of 0.71). Liver Function Tests:  Recent Labs Lab 09/25/15 0500  AST 15  ALT 28  ALKPHOS 121  BILITOT 0.5  PROT 4.7*  ALBUMIN 1.9*   No results for input(s): LIPASE, AMYLASE in the last 168 hours. No results for input(s): AMMONIA in the last 168 hours. Coagulation Profile:  Recent Labs Lab 09/25/15 0500 09/26/15 0423 09/27/15 0432 09/28/15 0439 09/29/15 0420  INR 2.99* 2.93* 1.86* 1.76* 2.20*   Cardiac Enzymes: No results for input(s):  CKTOTAL, CKMB, CKMBINDEX, TROPONINI in the last 168 hours. BNP (last 3 results) No results for input(s): PROBNP in the last 8760 hours. HbA1C: No results for input(s): HGBA1C in the last 72 hours. CBG: No results for input(s): GLUCAP in the last 168 hours. Lipid Profile: No results for input(s): CHOL, HDL, LDLCALC, TRIG, CHOLHDL, LDLDIRECT in the last 72 hours. Thyroid Function Tests: No results for input(s): TSH, T4TOTAL, FREET4, T3FREE, THYROIDAB in the last 72 hours. Anemia Panel: No results for input(s): VITAMINB12, FOLATE, FERRITIN, TIBC, IRON, RETICCTPCT in the last 72 hours. Sepsis Labs:  Recent Labs Lab 09/24/15 0845  LATICACIDVEN 1.6    Recent Results (from the past 240 hour(s))  Culture, blood (Routine X 2) w Reflex to ID Panel     Status: None   Collection Time: 09/21/15  5:35 PM  Result Value Ref Range Status   Specimen Description BLOOD RIGHT ARM  Final   Special Requests BOTTLES DRAWN AEROBIC AND ANAEROBIC  5CC, 10CC  Final   Culture   Final    NO GROWTH 5 DAYS Performed at [facility]    Report Status 09/26/2015 FINAL  Final  Culture, blood (Routine X 2) w Reflex to ID Panel     Status: None   Collection Time: 09/21/15  5:45 PM  Result Value Ref Range Status   Specimen Description BLOOD RIGHT ARM  Final   Special Requests IN PEDIATRIC BOTTLE  4CC  Final   Culture   Final    NO GROWTH 5 DAYS Performed at [facility]    Report Status 09/26/2015 FINAL  Final  Culture, blood (single)     Status: Abnormal   Collection Time: 09/24/15  9:20 AM  Result Value Ref Range Status   Specimen Description BLOOD LEFT ARM  10 ML IN [facility] BOTTLE  Final   Special Requests NONE  Final   Culture  Setup Time   Final    GRAM POSITIVE COCCI IN CLUSTERS ANAEROBIC BOTTLE ONLY CRITICAL RESULT CALLED TO, READ BACK BY AND VERIFIED WITH: Damian Leavell PHARMD AT 0347 09/25/15 L. BENFIELD    Culture (A)  Final    STAPHYLOCOCCUS AUREUS SUSCEPTIBILITIES PERFORMED ON  PREVIOUS CULTURE WITHIN THE LAST 5 DAYS. Performed at [facility]    Report Status 09/27/2015 FINAL  Final  Culture, blood (single)     Status: Abnormal   Collection Time: 09/24/15  9:45 AM  Result Value Ref Range Status   Specimen Description BLOOD PICC  10 ML IN [facility] BOTTLE  Final  Special Requests NONE  Final   Culture  Setup Time   Final    GRAM POSITIVE COCCI IN CLUSTERS ANAEROBIC BOTTLE ONLY CRITICAL RESULT CALLED TO, READ BACK BY AND VERIFIED WITH: Jerilynn Mages FRENS,PHARMD AT 2563 09/25/15 BY L BENFIELD Performed at [facility] (A)  Final   Report Status 09/27/2015 FINAL  Final   Organism ID, Bacteria METHICILLIN RESISTANT STAPHYLOCOCCUS AUREUS  Final      Susceptibility   Methicillin resistant staphylococcus aureus - MIC*    CIPROFLOXACIN >=8 RESISTANT Resistant     ERYTHROMYCIN >=8 RESISTANT Resistant     GENTAMICIN <=0.5 SENSITIVE Sensitive     OXACILLIN >=4 RESISTANT Resistant     TETRACYCLINE <=1 SENSITIVE Sensitive     VANCOMYCIN 1 SENSITIVE Sensitive     TRIMETH/SULFA <=10 SENSITIVE Sensitive     CLINDAMYCIN <=0.25 SENSITIVE Sensitive     RIFAMPIN <=0.5 SENSITIVE Sensitive     Inducible Clindamycin NEGATIVE Sensitive     * METHICILLIN RESISTANT STAPHYLOCOCCUS AUREUS  Blood Culture ID Panel (Reflexed)     Status: Abnormal   Collection Time: 09/24/15  9:45 AM  Result Value Ref Range Status   Enterococcus species NOT DETECTED NOT DETECTED Final   Vancomycin resistance NOT DETECTED NOT DETECTED Final   Listeria monocytogenes NOT DETECTED NOT DETECTED Final   Staphylococcus species DETECTED (A) NOT DETECTED Final    Comment: CRITICAL RESULT CALLED TO, READ BACK BY AND VERIFIED WITH: M FRENS,PHARMD AT 8937 09/25/15 BY L BENFIELD    Staphylococcus aureus DETECTED (A) NOT DETECTED Final    Comment: CRITICAL RESULT CALLED TO, READ BACK BY AND VERIFIED WITH: M FRENS,PHARMD AT 3428 09/25/15 BY L BENFIELD     Methicillin resistance DETECTED (A) NOT DETECTED Final    Comment: CRITICAL RESULT CALLED TO, READ BACK BY AND VERIFIED WITH: Edson Snowball AT 7681 09/25/15 BY L BENFIELD    Streptococcus species NOT DETECTED NOT DETECTED Final   Streptococcus agalactiae NOT DETECTED NOT DETECTED Final   Streptococcus pneumoniae NOT DETECTED NOT DETECTED Final   Streptococcus pyogenes NOT DETECTED NOT DETECTED Final   Acinetobacter baumannii NOT DETECTED NOT DETECTED Final   Enterobacteriaceae species NOT DETECTED NOT DETECTED Final   Enterobacter cloacae complex NOT DETECTED NOT DETECTED Final   Escherichia coli NOT DETECTED NOT DETECTED Final   Klebsiella oxytoca NOT DETECTED NOT DETECTED Final   Klebsiella pneumoniae NOT DETECTED NOT DETECTED Final   Proteus species NOT DETECTED NOT DETECTED Final   Serratia marcescens NOT DETECTED NOT DETECTED Final   Carbapenem resistance NOT DETECTED NOT DETECTED Final   Haemophilus influenzae NOT DETECTED NOT DETECTED Final   Neisseria meningitidis NOT DETECTED NOT DETECTED Final   Pseudomonas aeruginosa NOT DETECTED NOT DETECTED Final   Candida albicans NOT DETECTED NOT DETECTED Final   Candida glabrata NOT DETECTED NOT DETECTED Final   Candida krusei NOT DETECTED NOT DETECTED Final   Candida parapsilosis NOT DETECTED NOT DETECTED Final   Candida tropicalis NOT DETECTED NOT DETECTED Final    Comment: Performed at [facility]  Culture, Urine     Status: Abnormal   Collection Time: 09/24/15  2:55 PM  Result Value Ref Range Status   Specimen Description URINE, RANDOM  Final   Special Requests NONE  Final   Culture (A)  Final    <10,000 COLONIES/mL INSIGNIFICANT GROWTH Performed at [facility]    Report Status 09/25/2015 FINAL  Final  Culture, blood (Routine X 2)  w Reflex to ID Panel     Status: None (Preliminary result)   Collection Time: 09/26/15  1:11 PM  Result Value Ref Range Status   Specimen Description BLOOD RIGHT ARM  Final    Special Requests IN PEDIATRIC BOTTLE 0.5 CC  Final   Culture   Final    NO GROWTH 3 DAYS Performed at [facility]    Report Status PENDING  Incomplete  Culture, blood (Routine X 2) w Reflex to ID Panel     Status: None (Preliminary result)   Collection Time: 09/26/15  1:11 PM  Result Value Ref Range Status   Specimen Description BLOOD LEFT HAND  Final   Special Requests IN PEDIATRIC BOTTLE 1 CC  Final   Culture   Final    NO GROWTH 3 DAYS Performed at [facility]    Report Status PENDING  Incomplete  MRSA PCR Screening     Status: None   Collection Time: 09/28/15  7:31 AM  Result Value Ref Range Status   MRSA by PCR NEGATIVE NEGATIVE Final    Comment:        The GeneXpert MRSA Assay (FDA approved for NASAL specimens only), is one component of a comprehensive MRSA colonization surveillance program. It is not intended to diagnose MRSA infection nor to guide or monitor treatment for MRSA infections.          Radiology Studies: No results found.      Scheduled Meds: . antiseptic oral rinse  7 mL Mouth Rinse q12n4p  . chlorhexidine  15 mL Mouth Rinse BID  . dexamethasone  2 mg Oral Q12H  . docusate sodium  100 mg Oral BID  . feeding supplement (PRO-STAT SUGAR FREE 64)  30 mL Oral TID  . fentaNYL  50 mcg Transdermal Q72H  . gabapentin  100 mg Oral QHS  . lidocaine  1 patch Transdermal Q24H  . pantoprazole  40 mg Oral Daily  . polyethylene glycol  17 g Oral Daily  . sodium chloride  1,000 mL Intravenous Once  . sodium chloride  1,000 mL Intravenous Once  . sodium chloride flush  3 mL Intravenous Q12H  . vancomycin  1,000 mg Intravenous Q24H   Continuous Infusions:     LOS: 5 days    Time spent: 25 minutes.     Florencia Reasons, MD PhD [facility] Pager [phone]  If 7PM-7AM, please contact night-coverage [email or web address] Password [facility] 09/29/2015, 6:37 PM

## 2015-09-29 NOTE — Care Management Note (Signed)
Case Management Note  Patient Details  Name: [name] MRN: [id number] Date of Birth: 03/01/1946  Subjective/Objective: Per patient request spoke to cousin(by marriage-Lara-lee Oshaughnessy in rm-c#[phone]-she agrees to [facility] for iv infusion-[name] already following;agree to [facility] providing HHRN/PT, & other [facility] services. PT recc rw. Per Dionne Milo patient's home has  6 steps to negotiate to enter home-may need to consider ALF or PT to teach to negotiate steps with walker. CSW notified to follow.Patient [place] in [place]-may need to consider transport out county rules for ambulance non emergency. Will provide private sitter list also since cousin will stay for a short time in patient's home, then she will go on vacation to [place] within a months time.                   Action/Plan:d/c plan home w/HHC/ALF.   Expected Discharge Date:                  Expected Discharge Plan:  [place]  In-House Referral:     Discharge planning Services  CM Consult  Post Acute Care Choice:  Durable Medical Equipment Choice offered to:  Patient  DME Arranged:  Oxygen DME Agency:     HH Arranged:  RN, PT, OT, Nurse's Aide, Social Work [employer] Agency:  [place]  Status of Service:  In process, will continue to follow  Medicare Important Message Given:  Yes Date Medicare IM Given:    Medicare IM give by:    Date Additional Medicare IM Given:    Additional Medicare Important Message give by:     If discussed at [place] of Stay Meetings, dates discussed:    Additional Comments:  Dessa Phi, RN 09/29/2015, 4:03 PM

## 2015-09-29 NOTE — Progress Notes (Signed)
Physical Therapy Treatment Patient Details Name: [name] MRN: [id number] DOB: 10-Nov-1945 Today's Date: 09/29/2015    History of Present Illness 70 yo female admitted with Pna from [facility] in [place], SIRS (recent adm with same--pt went to [facility] and left AMA). Hx of breast cancer with bony mets, FTT, HTN    PT Comments    Assisted out of recliner to amb a greater distance in hallway used RW for safety however pt does not use in room.    Follow Up Recommendations  Home health PT;Supervision - Intermittent     Equipment Recommendations  Rolling walker with 5" wheels    Recommendations for Other Services       Precautions / Restrictions Precautions Precautions: Fall Precaution Comments: monitor sats Restrictions Weight Bearing Restrictions: No    Mobility  Bed Mobility               General bed mobility comments: Pt OOB in recliner  Transfers Overall transfer level: Needs assistance Equipment used: Rolling walker (2 wheeled) Transfers: Sit to/from Stand Sit to Stand: Supervision         General transfer comment:  for safety  Ambulation/Gait Ambulation/Gait assistance: Supervision Ambulation Distance (Feet): 275 Feet Assistive device: Rolling walker (2 wheeled) Gait Pattern/deviations: Step-through pattern Gait velocity: WFL   General Gait Details: gait is steady.  for safety.   amb on 2 lts   Stairs            Wheelchair Mobility    Modified Rankin (Stroke Patients Only)       Balance                                    Cognition Arousal/Alertness: Awake/alert Behavior During Therapy: WFL for tasks assessed/performed Overall Cognitive Status: Within Functional Limits for tasks assessed                      Exercises      General Comments        Pertinent Vitals/Pain Pain Assessment: Faces Faces Pain Scale: Hurts little more Pain Location: B feet and under b arms "don't touch"  these areas Pain Descriptors / Indicators: Grimacing;Discomfort Pain Intervention(s): Monitored during session    Home Living                      Prior Function            PT Goals (current goals can now be found in the care plan section) Progress towards PT goals: Progressing toward goals    Frequency  Min 3X/week    PT Plan Current plan remains appropriate    Co-evaluation             End of Session Equipment Utilized During Treatment: Gait belt;Oxygen Activity Tolerance: Patient tolerated treatment well Patient left: in chair;with call bell/phone within reach;with chair alarm set     Time: 1416-1430 PT Time Calculation (min) (ACUTE ONLY): 14 min  Charges:  $Gait Training: 8-22 mins                    G Codes:      Rica Koyanagi  PTA [facility]  Acute  Rehab Pager      [phone]

## 2015-09-29 NOTE — Telephone Encounter (Signed)
Per staff message and POF I have scheduled appts. Advised scheduler of appts and to move labs. JMW  

## 2015-09-29 NOTE — Progress Notes (Signed)
Patient ID: [name], female   DOB: [birth year], 70 y.o.   MRN: [id number]         [facility] for Infectious Disease    Date of Admission:  09/24/2015   Day 15 vancomycin         [name] is feeling a little bit better today. She was able to sit up in a chair for several hours yesterday afternoon. Her latest blood cultures are negative at 72 hours. Assuming they remain negative I would ask general surgery or interventional radiology to place a Port-A-Cath on Monday, 10/02/2015. I will plan on continuing vancomycin through 10/24/2015. Please call Dr. Tommy Medal [phone]) for any infectious disease questions this weekend.         Michel Bickers, MD [facility] [phone] pager   [phone] cell 05/02/2015, 1:32 PM

## 2015-09-29 NOTE — Progress Notes (Signed)
Patient ID: [name], female   DOB: [birth year], 70 y.o.   MRN: [id number]    Referring Physician(s): Magrinat,G  Supervising Physician: Sandi Mariscal  Patient Status: In-pt  Chief Complaint:  "I need another  port a cath"  Subjective: Pt known to IR service from prior CT guided L5 pedicle biopsy in 2010. She has history of stage IV breast cancer (initially diagnosed 2006) with prior chemoradiation and bilateral mastectomies. She was recently hospitalized for MRSA bacteremia/pneumonia and treated with IV vancomycin. She presented again to the hospital on 09/24/15 with weakness, dyspnea, and findings of acute kidney injury and leukocytosis. Past medical history also notable for protein calorie malnutrition, failure to thrive, hypertension, GERD and anemia. She has also been on Coumadin for chronic superficial thrombophlebitis. A pre-existing PICC line was removed on 5/30. Request now received for Port-A-Cath placement prior to additional chemotherapy. She currently denies fever, abdominal pain, nausea vomiting or abnormal bleeding. She does have occasional headaches, some dyspnea with exertion and occasional coughing/ occasional right posterior chest discomfort especially with deep breathing, intermittent lower extremity discomfort.  Past Medical History  Diagnosis Date  . Cancer (HCC)     RBreast, Lower back , Under l Arm  . Hypertension   . GERD (gastroesophageal reflux disease)   . Breast cancer metastasized to bone ([place]) 04/30/2011  . Radiation 01/15/2012    Lumbosacral spine 3500 cGy 14  fx  . Swollen R ankle 08/10/2012  . Fracture of ankle, medial malleolus, right, closed 08/10/2012  . Tubular adenoma 2011  . Hiatal hernia 2011  . Schatzki's ring   . Anorexia 03/02/2013  . Weight loss due to medication 03/02/2013  . C. difficile colitis   . Pneumonia   . 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 Gallbladder surgery  1991  . Mastectomy   06/28/10    bilateral- Dr. Rosebud Poles, [place]  . Inguinal hernia repair  90's    right  . Parathyroidectomy  90's  . Carpal tunnel release  90's  . Oophorectomy  90's    right  . Lymph node dissection    . Breast surgery    . Cholecystectomy    . Colonoscopy  01/08/10    Dr. [name]- anal tag,hemorrhoid o/w normal rectum, pancolonic diverticula, diminutive polyp in the base of the cecum= tubular adenoma on bx. poor prep  . Esophagogastroduodenoscopy  01/08/10    Dr. [name]- normal esophagus, small hiatal hernia, antum and body erosions, pedunculated polyp between D1 and D2, duodenal diverticulum, lymphangiectasia, second portion of the duodenum.stomach bx= inflammation, duodenum bx= adenoma  . Rotator cuff repair  08/27/11    Dr. Alphonzo Cruise- [facility]- [place] [facility]  . Esophagogastroduodenoscopy (egd) with propofol N/A 11/19/2012    RMR: non-critical Schatzki's ring s/p dilation with 42 F, multiple gastric polyps, duodenal polyp s/p piecemeal snare polypectomy, gastric polypectomy, path benign  . Maloney dilation N/A 11/19/2012    Procedure: Venia Minks DILATION;  Surgeon: Daneil Dolin, MD;  Location: [facility];  Service: Endoscopy;  Laterality: N/A;  #54  . Polypectomy N/A 11/19/2012    Procedure: POLYPECTOMY;  Surgeon: Daneil Dolin, MD;  Location: [facility];  Service: Endoscopy;  Laterality: N/A;  duodenal  . Biopsy N/A 11/19/2012    Procedure: BIOPSY;  Surgeon: Daneil Dolin, MD;  Location: [facility];  Service: Endoscopy;  Laterality: N/A;  esophageal  . Tee without cardioversion N/A 09/21/2015    Procedure: TRANSESOPHAGEAL ECHOCARDIOGRAM (TEE);  Surgeon: Sanda Klein, MD;  Location[facility] ENDOSCOPY;  Service: Cardiovascular;  Laterality: N/A;    Allergies: Aspirin; Boost pudding; Percodan; and Codeine  Medications: Prior to Admission medications   Medication Sig Start Date End Date Taking? Authorizing Provider  dexamethasone (DECADRON) 2 MG tablet Take 1 tablet (2 mg total) by mouth every 12 (twelve)  hours. 09/22/15   Shanker Kristeen Mans, MD  feeding supplement (BOOST / RESOURCE BREEZE) LIQD Take 1 Container by mouth 2 (two) times daily between meals. 09/22/15   Shanker Kristeen Mans, MD  fentaNYL (DURAGESIC - DOSED MCG/HR) 50 MCG/HR Place 1 patch (50 mcg total) onto the skin every 3 (three) days. 09/22/15   Shanker Kristeen Mans, MD  lidocaine (LIDODERM) 5 % Place 1 patch onto the skin daily. Remove & Discard patch within 12 hours or as directed by MD 09/22/15   Jonetta Osgood, MD  magic mouthwash w/lidocaine SOLN Take 10 mLs by mouth 3 (three) times daily as needed for mouth pain. 09/22/15   Shanker Kristeen Mans, MD  morphine (MSIR) 15 MG tablet Take 1 tablet (15 mg total) by mouth every 4 (four) hours as needed for severe pain. Once every 12 hours as needed 09/22/15   Jonetta Osgood, MD  ondansetron (ZOFRAN-ODT) 8 MG disintegrating tablet DISSOLVE 1 TABLET ON THE TONGUE EVERY 8 HOURS AS NEEDED FOR NAUSEA 09/22/15   Shanker Kristeen Mans, MD  pantoprazole (PROTONIX) 40 MG tablet Take 1 tablet (40 mg total) by mouth daily. 09/22/15   Shanker Kristeen Mans, MD  Polyethyl Glycol-Propyl Glycol (SYSTANE OP) Apply 1 drop to eye daily as needed (dry eyes).    Historical Provider, MD  polyethylene glycol (MIRALAX / GLYCOLAX) packet Take 17 g by mouth daily. 09/22/15   Shanker Kristeen Mans, MD  Vancomycin (VANCOCIN) 750-5 MG/150ML-% SOLN Inject 150 mLs (750 mg total) into the vein every 12 (twelve) hours. FOR 4 WEEKS FROM 09/19/15 09/22/15   Shanker Kristeen Mans, MD  warfarin (COUMADIN) 4 MG tablet Take 1 tablet (4 mg total) by mouth daily. Patient taking differently: Take 2-4 mg by mouth daily. Take 2 mg on Mon thru Thurs & Take 4 mg on Fri, Sat, Sun 02/28/15   Chauncey Cruel, MD     Vital Signs: BP 112/67 mmHg  Pulse 73  Temp(Src) 98 F (36.7 C) (Oral)  Resp 24  Ht 5\' 2"  (1.575 m)  Wt 124 lb 3.2 oz (56.337 kg)  BMI 22.71 kg/m2  SpO2 99%  Physical Exam awake, alert. Chest with few bibasilar crackles. Heart with regular  rate and rhythm. Abdomen soft, positive bowel sounds, nontender  Imaging: No results found.  Labs:  CBC:  Recent Labs  09/25/15 0500 09/26/15 0423 09/27/15 0432 09/28/15 0439  WBC 10.6* 11.2* 9.7 9.8  HGB 6.8* 10.2* 10.0* 10.1*  HCT 21.0* 30.5* 30.0* 30.4*  PLT 186 173 181 197    COAGS:  Recent Labs  09/24/15 0945  09/26/15 0423 09/27/15 0432 09/28/15 0439 09/29/15 0420  INR 1.96*  < > 2.93* 1.86* 1.76* 2.20*  APTT 40*  --   --   --   --   --   < > = values in this interval not displayed.  BMP:  Recent Labs  09/25/15 0500 09/26/15 0423 09/27/15 0432 09/28/15 0439  NA 140 138 139 139  K 4.4 4.3 3.5 4.2  CL 109 106 104 103  CO2 27 27 26 30   GLUCOSE 120* 101* 114* 126*  BUN 30* 21* 20 25*  CALCIUM 7.6* 8.6* 8.1* 7.6*  CREATININE 0.82 0.54 0.62 0.71  GFRNONAA >60 >60 >60 >60  GFRAA >60 >60 >60 >60    LIVER FUNCTION TESTS:  Recent Labs  09/06/15 1027 09/15/15 0521 09/16/15 0532 09/25/15 0500  BILITOT 0.54 1.4* 0.8 0.5  AST 16 70* 38 15  ALT 23 69* 54 28  ALKPHOS 161* 153* 142* 121  PROT 6.4 5.3* 4.9* 4.7*  ALBUMIN 2.8* 2.1* 1.9* 1.9*    Assessment and Plan: Patient with history of stage IV breast cancer,  prior chemoradiation and recently treated MRSA bacteremia/pneumonia; on Coumadin for chronic superficial thrombophlebitis; PICC line removed on 5/30. Request received for Port-A-Cath placement for additional chemotherapy. Patient has history of prior left chest wall Port-A-Cath placed at [facility], since removed. Risks and benefits discussed with the patient including, but not limited to bleeding, infection, pneumothorax, or fibrin sheath development and need for additional procedures.All of the patient's questions were answered, patient is agreeable to proceed.Consent signed and in chart. Labs today include PT 23.5, INR 2.2, WBC 9.8, hemoglobin 10.1, platelets 197k, creatinine 0.71. Due to elevated INR will postpone port placement until 6/5.  Coumadin has been held.     Electronically Signed: D. Rowe Robert 09/29/2015, 2:13 PM   I spent a total of 20 minutes at the the patient's bedside AND on the patient's hospital floor or unit, greater than 50% of which was counseling/coordinating care for port a cath placement.

## 2015-09-29 NOTE — Progress Notes (Signed)
[name]   DOB:02-10-1946   HA#:[id number]   XBD#:[id number]  Subjective: [name] is feeling much better-- ambulating in room--for the first time in a long time "I'm starving." Pain is still not quite where she wants it--"they don't bring it like they should." She is engaged in d/c planning and supportive of her "niece"s" help [name]. Discusssed trying to get her port in today to facilitate discharge Monday. No family in room  Objective: older  White woman examined at bedsine  Filed Vitals:   09/28/15 2110 09/29/15 0613  BP: 105/61 121/49  Pulse: 82 78  Temp: 98.2 F (36.8 C) 98.3 F (36.8 C)  Resp: 18 18    Body mass index is 22.71 kg/(m^2).  Intake/Output Summary (Last 24 hours) at 09/29/15 1357 Last data filed at 09/29/15 1050  Gross per 24 hour  Intake    240 ml  Output      2 ml  Net    238 ml     Sclerae unicteric  Oro[pharynx clear  Lungs no rales or rwheezes  Heart regular rate and rhythm  Abdomen benign  Neuro nonfocal  Breast exam: deferred  CBG (last 3)  No results for input(s): GLUCAP in the last 72 hours.   Labs:  Lab Results  Component Value Date   WBC 9.8 09/28/2015   HGB 10.1* 09/28/2015   HCT 30.4* 09/28/2015   MCV 97.4 09/28/2015   PLT 197 09/28/2015   NEUTROABS 6.3 09/06/2015    _0 @  Urine Studies No results for input(s): UHGB, CRYS in the last 72 hours.  Invalid input(s): UACOL, UAPR, USPG, UPH, UTP, UGL, UKET, UBIL, UNIT, UROB, ULEU, UEPI, UWBC, URBC, [place], CAST, [place], [place]  Basic Metabolic Panel:  Recent Labs Lab 09/25/15 0500 09/26/15 0423 09/27/15 0432 09/28/15 0439  NA 140 138 139 139  K 4.4 4.3 3.5 4.2  CL 109 106 104 103  CO2 _1 GLUCOSE 120* 101* 114* 126*  BUN 30* 21* 20 25*  CREATININE 0.82 0.54 0.62 0.71  CALCIUM 7.6* 8.6* 8.1* 7.6*  MG  --   --   --  1.3*   GFR Estimated Creatinine Clearance: 51 mL/min (by C-G formula based on Cr of 0.71). Liver Function Tests:  Recent Labs Lab  09/25/15 0500  AST 15  ALT 28  ALKPHOS 121  BILITOT 0.5  PROT 4.7*  ALBUMIN 1.9*   No results for input(s): LIPASE, AMYLASE in the last 168 hours. No results for input(s): AMMONIA in the last 168 hours. Coagulation profile  Recent Labs Lab 09/25/15 0500 09/26/15 0423 09/27/15 0432 09/28/15 0439 09/29/15 0420  INR 2.99* 2.93* 1.86* 1.76* 2.20*    CBC:  Recent Labs Lab 09/25/15 0500 09/26/15 0423 09/27/15 0432 09/28/15 0439  WBC 10.6* 11.2* 9.7 9.8  HGB 6.8* 10.2* 10.0* 10.1*  HCT 21.0* 30.5* 30.0* 30.4*  MCV 102.4* 96.8 96.8 97.4  PLT 186 173 181 197   Cardiac Enzymes: No results for input(s): CKTOTAL, CKMB, CKMBINDEX, TROPONINI in the last 168 hours. BNP: Invalid input(s): POCBNP CBG: No results for input(s): GLUCAP in the last 168 hours. D-Dimer No results for input(s): DDIMER in the last 72 hours. Hgb A1c No results for input(s): HGBA1C in the last 72 hours. Lipid Profile No results for input(s): CHOL, HDL, LDLCALC, TRIG, CHOLHDL, LDLDIRECT in the last 72 hours. Thyroid function studies No results for input(s): TSH, T4TOTAL, T3FREE, THYROIDAB in the last 72 hours.  Invalid input(s): FREET3 Anemia work up  No results for input(s): VITAMINB12, FOLATE, FERRITIN, TIBC, IRON, RETICCTPCT in the last 72 hours. Microbiology Recent Results (from the past 240 hour(s))  Culture, blood (Routine X 2) w Reflex to ID Panel     Status: None   Collection Time: 09/21/15  5:35 PM  Result Value Ref Range Status   Specimen Description BLOOD RIGHT ARM  Final   Special Requests BOTTLES DRAWN AEROBIC AND ANAEROBIC  5CC, 10CC  Final   Culture   Final    NO GROWTH 5 DAYS Performed at [facility]    Report Status 09/26/2015 FINAL  Final  Culture, blood (Routine X 2) w Reflex to ID Panel     Status: None   Collection Time: 09/21/15  5:45 PM  Result Value Ref Range Status   Specimen Description BLOOD RIGHT ARM  Final   Special Requests IN PEDIATRIC BOTTLE  4CC   Final   Culture   Final    NO GROWTH 5 DAYS Performed at [facility]    Report Status 09/26/2015 FINAL  Final  Culture, blood (single)     Status: Abnormal   Collection Time: 09/24/15  9:20 AM  Result Value Ref Range Status   Specimen Description BLOOD LEFT ARM  10 ML IN [facility] BOTTLE  Final   Special Requests NONE  Final   Culture  Setup Time   Final    GRAM POSITIVE COCCI IN CLUSTERS ANAEROBIC BOTTLE ONLY CRITICAL RESULT CALLED TO, READ BACK BY AND VERIFIED WITH: Damian Leavell PHARMD AT 7628 09/25/15 L. BENFIELD    Culture (A)  Final    STAPHYLOCOCCUS AUREUS SUSCEPTIBILITIES PERFORMED ON PREVIOUS CULTURE WITHIN THE LAST 5 DAYS. Performed at [facility]    Report Status 09/27/2015 FINAL  Final  Culture, blood (single)     Status: Abnormal   Collection Time: 09/24/15  9:45 AM  Result Value Ref Range Status   Specimen Description BLOOD PICC  10 ML IN [facility] BOTTLE  Final   Special Requests NONE  Final   Culture  Setup Time   Final    GRAM POSITIVE COCCI IN CLUSTERS ANAEROBIC BOTTLE ONLY CRITICAL RESULT CALLED TO, READ BACK BY AND VERIFIED WITH: Jerilynn Mages FRENS,PHARMD AT 3151 09/25/15 BY L BENFIELD Performed at [facility]    Culture METHICILLIN RESISTANT STAPHYLOCOCCUS AUREUS (A)  Final   Report Status 09/27/2015 FINAL  Final   Organism ID, Bacteria METHICILLIN RESISTANT STAPHYLOCOCCUS AUREUS  Final      Susceptibility   Methicillin resistant staphylococcus aureus - MIC*    CIPROFLOXACIN >=8 RESISTANT Resistant     ERYTHROMYCIN >=8 RESISTANT Resistant     GENTAMICIN <=0.5 SENSITIVE Sensitive     OXACILLIN >=4 RESISTANT Resistant     TETRACYCLINE <=1 SENSITIVE Sensitive     VANCOMYCIN 1 SENSITIVE Sensitive     TRIMETH/SULFA <=10 SENSITIVE Sensitive     CLINDAMYCIN <=0.25 SENSITIVE Sensitive     RIFAMPIN <=0.5 SENSITIVE Sensitive     Inducible Clindamycin NEGATIVE Sensitive     * METHICILLIN RESISTANT STAPHYLOCOCCUS AUREUS  Blood Culture ID Panel (Reflexed)      Status: Abnormal   Collection Time: 09/24/15  9:45 AM  Result Value Ref Range Status   Enterococcus species NOT DETECTED NOT DETECTED Final   Vancomycin resistance NOT DETECTED NOT DETECTED Final   Listeria monocytogenes NOT DETECTED NOT DETECTED Final   Staphylococcus species DETECTED (A) NOT DETECTED Final    Comment: CRITICAL RESULT CALLED TO, READ BACK BY AND VERIFIED WITH:  M FRENS,PHARMD AT 4235 09/25/15 BY L BENFIELD    Staphylococcus aureus DETECTED (A) NOT DETECTED Final    Comment: CRITICAL RESULT CALLED TO, READ BACK BY AND VERIFIED WITH: M FRENS,PHARMD AT 3614 09/25/15 BY L BENFIELD    Methicillin resistance DETECTED (A) NOT DETECTED Final    Comment: CRITICAL RESULT CALLED TO, READ BACK BY AND VERIFIED WITH: Edson Snowball AT 4315 09/25/15 BY L BENFIELD    Streptococcus species NOT DETECTED NOT DETECTED Final   Streptococcus agalactiae NOT DETECTED NOT DETECTED Final   Streptococcus pneumoniae NOT DETECTED NOT DETECTED Final   Streptococcus pyogenes NOT DETECTED NOT DETECTED Final   Acinetobacter baumannii NOT DETECTED NOT DETECTED Final   Enterobacteriaceae species NOT DETECTED NOT DETECTED Final   Enterobacter cloacae complex NOT DETECTED NOT DETECTED Final   Escherichia coli NOT DETECTED NOT DETECTED Final   Klebsiella oxytoca NOT DETECTED NOT DETECTED Final   Klebsiella pneumoniae NOT DETECTED NOT DETECTED Final   Proteus species NOT DETECTED NOT DETECTED Final   Serratia marcescens NOT DETECTED NOT DETECTED Final   Carbapenem resistance NOT DETECTED NOT DETECTED Final   Haemophilus influenzae NOT DETECTED NOT DETECTED Final   Neisseria meningitidis NOT DETECTED NOT DETECTED Final   Pseudomonas aeruginosa NOT DETECTED NOT DETECTED Final   Candida albicans NOT DETECTED NOT DETECTED Final   Candida glabrata NOT DETECTED NOT DETECTED Final   Candida krusei NOT DETECTED NOT DETECTED Final   Candida parapsilosis NOT DETECTED NOT DETECTED Final   Candida tropicalis NOT  DETECTED NOT DETECTED Final    Comment: Performed at [facility]  Culture, Urine     Status: Abnormal   Collection Time: 09/24/15  2:55 PM  Result Value Ref Range Status   Specimen Description URINE, RANDOM  Final   Special Requests NONE  Final   Culture (A)  Final    <10,000 COLONIES/mL INSIGNIFICANT GROWTH Performed at [facility]    Report Status 09/25/2015 FINAL  Final  Culture, blood (Routine X 2) w Reflex to ID Panel     Status: None (Preliminary result)   Collection Time: 09/26/15  1:11 PM  Result Value Ref Range Status   Specimen Description BLOOD RIGHT ARM  Final   Special Requests IN PEDIATRIC BOTTLE 0.5 CC  Final   Culture   Final    NO GROWTH 2 DAYS Performed at [facility]    Report Status PENDING  Incomplete  Culture, blood (Routine X 2) w Reflex to ID Panel     Status: None (Preliminary result)   Collection Time: 09/26/15  1:11 PM  Result Value Ref Range Status   Specimen Description BLOOD LEFT HAND  Final   Special Requests IN PEDIATRIC BOTTLE 1 CC  Final   Culture   Final    NO GROWTH 2 DAYS Performed at [facility]    Report Status PENDING  Incomplete  MRSA PCR Screening     Status: None   Collection Time: 09/28/15  7:31 AM  Result Value Ref Range Status   MRSA by PCR NEGATIVE NEGATIVE Final    Comment:        The GeneXpert MRSA Assay (FDA approved for NASAL specimens only), is one component of a comprehensive MRSA colonization surveillance program. It is not intended to diagnose MRSA infection nor to guide or monitor treatment for MRSA infections.       Studies:  No results found.  Assessment: 70 y.o. [place], [place] woman status post right lumpectomy and axillary  lymph node dissection December 2006 for a [place] N2, stage IIIA invasive ductal carcinoma, estrogen and progesterone receptor positive, with subsequent stage IV disease  (1) received adjuvant doxorubicin, docetaxel and cyclophosphamide  (2) status post  adjuvant radiation  (3) on tamoxifen to September 2011 when she was noted to have metastatic disease to bone  (4) fulvestrant 02/06/2009 to 01/15/2011  (5) left axillary lymph node positive for an invasive ductal carcinoma, estrogen receptor and HER-2 negative November 2011, and a right breast recurrence for an estrogen receptor positive lesion at the same time.  (6) status post bilateral mastectomies 06/28/2010  (7) status post left chest and axillary radiation  (8) status post right chest wall recurrence October of 2012, treated with electron beam radiation completed December 2012  (9) progression in bone August 2013,  (10) status post radiation to the lumbosacral spine completed October 2013   (11) capecitabine November 2013 through January 2014  (12) tamoxifen/ everolimus February 2014, discontinued November 2014 with progression  (13) Megace 80 mg twice daily with progression January 2015  (14) Radiation to lumbar spine February 2015  (15) letrozole plus Ibrance started 07/08/2013, discontinued May 2017 with progression  (16). Declines genetic testing (as of 08/17/2013)  (17) zolendronate 05/01/2009, discontinued because of pain; Xgeva started 10/08/2013, discontinued because of cost issues as of November 2015, switched to alendronate March 2016, switched back to [place] but stopped after the May 2016 dose because of side effects, on ibandronate [125 mg/ day 21 days on, 7 days off] temporarily, denosumab to be resumed 10/02/2015  (18) cyclophosphamide, methotrexate, and fluorouracil to be started 10/09/2015, repeated every 21 days.  (19) palliative radiation to be completed 10/02/2015    Plan:   tried to get a port and arranged for today, but the INR is a little bit too high. I have gone ahead and stopped the Coumadin. The patient of course may eat today. She should be nothing by mouth after midnight Sunday for port placement Monday morning. The order for port placement has  been placed.  I have discussed the overall situation with the patient's "niece" (actually her cousin's wife) Higinio Roger, who is  Making arrangements for the patient to have enough support that she can be safely at home.  I am postponing her chemotherapy from 6/5 to 6/12-- patient is aware.  Greatly appreciate your help to this patient! Chauncey Cruel, MD 09/29/2015  1:57 PM Medical Oncology and Hematology [facility] [address] [place], [place] [address] Tel. [phone]    Fax. [phone]

## 2015-09-29 NOTE — Care Management Note (Signed)
Case Management Note  Patient Details  Name: [name] MRN: [id number] Date of Birth: [birth year]  Subjective/Objective:  Patient still deciding who she will want for HHC-HHRN-iv abx management.[facility] iv liason [name] has been working with patient & will follow for iv abx teaching. Patient will need HHRN/HHPT for instruction & management. [name] can accept(see [facility]  Iv liason prior note)Patient recc for HHPT, rw-will await patient's choice for HHC. Will need HHRN/PT,script,rw orders,& [id number].                  Action/Plan:d/c plan home w/HHC.   Expected Discharge Date:                  Expected Discharge Plan:  [name]  In-House Referral:     Discharge planning Services  CM Consult  Post Acute Care Choice:  Durable Medical Equipment Choice offered to:  Patient  DME Arranged:  Oxygen DME Agency:     HH Arranged:  RN, PT, OT, Nurse's Aide, Social Work [employer] Agency:  [place]  Status of Service:  In process, will continue to follow  Medicare Important Message Given:  Yes Date Medicare IM Given:    Medicare IM give by:    Date Additional Medicare IM Given:    Additional Medicare Important Message give by:     If discussed at [place] of Stay Meetings, dates discussed:    Additional Comments:  Dessa Phi, RN 09/29/2015, 3:16 PM

## 2015-09-30 LAB — CBC
HCT: 30.9 % — ABNORMAL LOW (ref 36.0–46.0)
Hemoglobin: 10 g/dL — ABNORMAL LOW (ref 12.0–15.0)
MCH: 32.2 pg (ref 26.0–34.0)
MCHC: 32.4 g/dL (ref 30.0–36.0)
MCV: 99.4 fL (ref 78.0–100.0)
PLATELETS: 199 10*3/uL (ref 150–400)
RBC: 3.11 MIL/uL — ABNORMAL LOW (ref 3.87–5.11)
RDW: 18.7 % — AB (ref 11.5–15.5)
WBC: 12 10*3/uL — AB (ref 4.0–10.5)

## 2015-09-30 LAB — BASIC METABOLIC PANEL
ANION GAP: 8 (ref 5–15)
BUN: 24 mg/dL — ABNORMAL HIGH (ref 6–20)
CALCIUM: 8.1 mg/dL — AB (ref 8.9–10.3)
CO2: 28 mmol/L (ref 22–32)
CREATININE: 0.64 mg/dL (ref 0.44–1.00)
Chloride: 105 mmol/L (ref 101–111)
GLUCOSE: 120 mg/dL — AB (ref 65–99)
Potassium: 4.2 mmol/L (ref 3.5–5.1)
Sodium: 141 mmol/L (ref 135–145)

## 2015-09-30 LAB — PROTIME-INR
INR: 2.23 — AB (ref 0.00–1.49)
Prothrombin Time: 23.8 seconds — ABNORMAL HIGH (ref 11.6–15.2)

## 2015-09-30 LAB — MAGNESIUM: Magnesium: 1.5 mg/dL — ABNORMAL LOW (ref 1.7–2.4)

## 2015-09-30 MED ORDER — MAGNESIUM SULFATE 2 GM/50ML IV SOLN
2.0000 g | Freq: Once | INTRAVENOUS | Status: AC
  Administered 2015-09-30: 2 g via INTRAVENOUS
  Filled 2015-09-30: qty 50

## 2015-09-30 NOTE — Progress Notes (Signed)
ANTICOAGULATION CONSULT NOTE - Follow Up  Pharmacy Consult for Warfarin Indication: History extensive superficial vein thrombosis  Allergies  Allergen Reactions  . Aspirin Nausea And Vomiting  . Boost Pudding [Nutritional Supplements] Diarrhea  . Percodan [Oxycodone-Aspirin] Nausea Only  . Codeine Nausea Only    Patient Measurements: Height: 5\' 2"  (157.5 cm) Weight: 124 lb 3.2 oz (56.337 kg) IBW/kg (Calculated) : 50.1  Vital Signs: Temp: 98.4 F (36.9 C) (06/03 0452) Temp Source: Oral (06/03 0452) BP: 135/67 mmHg (06/03 0452) Pulse Rate: 64 (06/03 0452)  Labs:  Recent Labs  09/28/15 0439 09/29/15 0420 09/30/15 0425  HGB 10.1*  --  10.0*  HCT 30.4*  --  30.9*  PLT 197  --  199  LABPROT 19.9* 23.5* 23.8*  INR 1.76* 2.20* 2.23*  CREATININE 0.71  --  0.64    Estimated Creatinine Clearance: 51 mL/min (by C-G formula based on Cr of 0.64).   Medications:  Scheduled:  . antiseptic oral rinse  7 mL Mouth Rinse q12n4p  . chlorhexidine  15 mL Mouth Rinse BID  . dexamethasone  2 mg Oral Q12H  . docusate sodium  100 mg Oral BID  . feeding supplement (PRO-STAT SUGAR FREE 64)  30 mL Oral TID  . fentaNYL  50 mcg Transdermal Q72H  . gabapentin  100 mg Oral QHS  . lidocaine  1 patch Transdermal Q24H  . magnesium sulfate 1 - 4 g bolus IVPB  2 g Intravenous Once  . pantoprazole  40 mg Oral Daily  . polyethylene glycol  17 g Oral Daily  . sodium chloride  1,000 mL Intravenous Once  . sodium chloride  1,000 mL Intravenous Once  . sodium chloride flush  3 mL Intravenous Q12H  . vancomycin  1,000 mg Intravenous Q24H   Infusions:    PRN: acetaminophen **OR** acetaminophen, magic mouthwash w/lidocaine, morphine, morphine injection, ondansetron **OR** ondansetron (ZOFRAN) IV, polyvinyl alcohol, sodium chloride flush  Assessment: 70 y.o. woman with a history of metastatic breast cancer, bone pain anticipating palliative radiation therapy, recently discharged 5/26 then readmitted  5/28 after leaving SNF AMA.  She is on chronic warfarin therapy for hx extensive superficial vein thrombosis. INR is essentially therapeutic on admission (1.96) and Pharmacy is consulted to continue dosing while inpatient.  Dose prior to 5/18 admission reported as 2mg  daily except 4mg  Fri, Sat, Sun Recent dosing history in Epic reviewed: doses ranged from 1mg  to 4mg  to achieve therapeutic INR  Today, 09/30/2015  Warfarin now on hold for planned port placement 6/5.   LD 6/1.    INR remains therapeutic but should start trending down.    Diet: Eating 50-100% meals  No drug interactions  SCDs charted  Goal of Therapy:  INR 2-3   Plan:  Hold warfarin until port placement on 6/5 (Monday).  F/u timing for restarting warfarin after procedure.    Ralene Bathe, PharmD, BCPS 09/30/2015, 9:37 AM  Pager: [phone]

## 2015-09-30 NOTE — Progress Notes (Signed)
PROGRESS NOTE    [name]  SWF:[id number] DOB: 21-Jun-1945 DOA: 09/24/2015 PCP: Curlene Labrum, MD    Brief Narrative: [name] is a 70 y.o. woman with a history of metastatic breast cancer, bone pain anticipating palliative radiation therapy, severe protein calorie malnutrition with adult failure to thrive, HTN, and GERD who was just discharged from this hospital on 09/22/2015 and sent to SNF for management of MRSA bacteremia. The plan was for her to complete a 4 week course of IV vancomycin from 09/19/2015. She left SNF AMA at some point yesterday, reporting that she was not receiving pain medications or her antibiotics as ordered. She was taken home by a friend (without access to supplemental oxygen). She became profoundly weak and was found down. She insists that she did no lose consciousness; she was just too weak to get herself up. She complains of chronic pain at this point. She had left sided pleuritic chest pain that improved with oxygen. She reports that she was only SOB when she was off of oxygen. Again, no LOC. No evidence of acute blood loss. No nausea or vomiting ("I don't know why they gave me Zofran at the other hospital."). 1-2 loose stools daily.  ED Course: The patient was evaluated in the ED at [facility]. Pertinent findings include new leukocytosis compared to discharge and evidence of acute kidney injury. No documented fever. Alk phos remains elevated, but LFTs have otherwise improved. Protein levels remain low. CT chest interpreted as multifocal pneumonia; I suspect this represents the same process that was identified here by chest CT on Sep 20, 2015.   Assessment & Plan:   Principal Problem:   MRSA bacteremia Active Problems:   GERD   Personal history of thrombophlebitis   Breast cancer metastasized to bone (HCC)   AKI (acute kidney injury) ([place])   Essential hypertension   Protein-calorie malnutrition, severe (HCC)   Pain from bone  metastases (HCC)   Generalized abdominal pain   Elevated LFTs   Rib pain on left side   Acute septic pulmonary embolism (HCC)   Acute respiratory failure with hypoxia (HCC)   Leukocytosis   Necrotizing pneumonia (HCC)   PICC line infection   Goals of care, counseling/discussion   Cavitary pneumonia  MRSA bacteremia with HCAP and probable septic embolic to the lung with new leukocytosis compared to discharge -- urine culture insignificant growth, blood cultures from 5/28 +MRSA 2/2, repeat blood culture from 5/30, no growth continue  -picc line placed from previously hospitalization removed on 5/30 -continue Vancomycin which was started from admission, she received levaquin, and zosyn initially on admission -TEE no vegetation on 5/25 from previous hospitalization, no need to repeat TEE, port placement early next week per ID recommendation, plan for iv abx till 6/27-appreciate ID input Coumadin held since 6/2 in preparation for port placement on 6/5, need to be npo aftermidnight on Sunday.   Hypoxia: o2 dropped to 82 on room air  While ambulating, will need arrange home o2.  History of metastatic breast cancer with bone pain: Recent PET scan on 08/31/15 showed numerous osseous mets-followed by Dr [name]. Recent MRI entire spine on 4/28 showed multiple osseous spinal mets with no cord compression. Plan for port placement and chemotherapy postponed with last admission due to acute illness.  Palliative radiation therapy anticipated for skull, left rib cage, and right tibial lesions. Discussed with Dr [name] patient will received localized radiation, ok to proceed to prevents complication meningion carcinomatosis.  pateint to received total  of 5 days xrt, Last xrt on 6/5 per rad onc. Dr [name] plan to start first chemo on 6/12  History of abnormal LFTs --Overall improved. No evidence of obvious liver mets on recent imaging. Continue to monitor.  Chronic pain --Fentanyl patch, MSIR  prn (may need to change to dilaudid if renal failure persists)  GERD --Continue PPI  AKI with relative hypotension --Will aggressively hydrate and monitor for now --If renal function does not improve, will need further work-up --Pharmacy to renally dose medications  Acute on Chronic anemia --Hgb drop to 6,8 0n 5/29. Received   2 units PRBC 5-30. Hb increase to 10/  Hold coumadin held on 5/29 and 5/30, hgb has been stable, coumadin resumed on 5/32.  Stool guaiac pending collection.  Chronic anticoagulation for chronic, extensive superficial thrombophlebitis coumadin held on 5/29 and 5/30, due to drop of hemoglobin on 5/29.  hgb stabilized after prbc transfusion, no overt sign of bleed, coumadin resumed on 5/31 target on lower end of therapeutic range, monitor hgb, inr, Patient does has bilateral lower extremity edema, venous US no dvt, trial of lasix on 5/31 Coumadin held  Since 6/2 in ancitipating port placement on 6/5.  History of hypertension but now showing relative hypotension --s/p IV fluids.  --Currently asymptomatic  Anorexia: Likely secondary to underlying malignancy worsened by pneumonia.   Protein-calorie malnutrition, severe: Continue supplements    DVT prophylaxis: coumadin held for procedure on monday Code Status: DNR Family Communication: care discussed with patient  Disposition Plan: home with home health, home oxygen early next week   Consultants:   ID  Oncology  Rad onc  IR   Procedures:   Removal of picc line on 5/30  Medi Port placement on 6/5  Antimicrobials:   Vancomycin    Subjective: Sitting in chair, not in distress,   Objective: Filed Vitals:   09/29/15 0613 09/29/15 1407 09/29/15 2057 09/30/15 0452  BP: 121/49 112/67 127/83 135/67  Pulse: 78 73 89 64  Temp: 98.3 F (36.8 C) 98 F (36.7 C) 98.2 F (36.8 C) 98.4 F (36.9 C)  TempSrc: Oral Oral Oral Oral  Resp: _0 Height:      Weight:      SpO2: 93% 99% 99%  96%    Intake/Output Summary (Last 24 hours) at 09/30/15 1410 Last data filed at 09/30/15 0800  Gross per 24 hour  Intake    240 ml  Output      0 ml  Net    240 ml   Filed Weights   09/24/15 0605  Weight: 56.337 kg (124 lb 3.2 oz)    Examination:  General exam: Appears calm and comfortable  Respiratory system: Clear to auscultation. Respiratory effort normal. Cardiovascular system: S1 & S2 heard, RRR. No JVD, murmurs, rubs, gallops or clicks. Less  pedal edema. Gastrointestinal system: Abdomen is nondistended, soft and nontender. No organomegaly or masses felt. Normal bowel sounds heard. Central nervous system: Alert and oriented. No focal neurological deficits. Extremities: Symmetric 5 x 5 power.  Skin: No rashes, lesions or ulcers     Data Reviewed: I have personally reviewed following labs and imaging studies  CBC:  Recent Labs Lab 09/25/15 0500 09/26/15 0423 09/27/15 0432 09/28/15 0439 09/30/15 0425  WBC 10.6* 11.2* 9.7 9.8 12.0*  HGB 6.8* 10.2* 10.0* 10.1* 10.0*  HCT 21.0* 30.5* 30.0* 30.4* 30.9*  MCV 102.4* 96.8 96.8 97.4 99.4  PLT 186 173 181 197 992   Basic Metabolic Panel:  Recent Labs Lab 09/25/15 0500 09/26/15 0423 09/27/15 0432 09/28/15 0439 09/30/15 0425  NA 140 138 139 139 141  K 4.4 4.3 3.5 4.2 4.2  CL 109 106 104 103 105  CO2 _0 GLUCOSE 120* 101* 114* 126* 120*  BUN 30* 21* 20 25* 24*  CREATININE 0.82 0.54 0.62 0.71 0.64  CALCIUM 7.6* 8.6* 8.1* 7.6* 8.1*  MG  --   --   --  1.3* 1.5*   GFR: Estimated Creatinine Clearance: 51 mL/min (by C-G formula based on Cr of 0.64). Liver Function Tests:  Recent Labs Lab 09/25/15 0500  AST 15  ALT 28  ALKPHOS 121  BILITOT 0.5  PROT 4.7*  ALBUMIN 1.9*   No results for input(s): LIPASE, AMYLASE in the last 168 hours. No results for input(s): AMMONIA in the last 168 hours. Coagulation Profile:  Recent Labs Lab 09/26/15 0423 09/27/15 0432 09/28/15 0439 09/29/15 0420  09/30/15 0425  INR 2.93* 1.86* 1.76* 2.20* 2.23*   Cardiac Enzymes: No results for input(s): CKTOTAL, CKMB, CKMBINDEX, TROPONINI in the last 168 hours. BNP (last 3 results) No results for input(s): PROBNP in the last 8760 hours. HbA1C: No results for input(s): HGBA1C in the last 72 hours. CBG: No results for input(s): GLUCAP in the last 168 hours. Lipid Profile: No results for input(s): CHOL, HDL, LDLCALC, TRIG, CHOLHDL, LDLDIRECT in the last 72 hours. Thyroid Function Tests: No results for input(s): TSH, T4TOTAL, FREET4, T3FREE, THYROIDAB in the last 72 hours. Anemia Panel: No results for input(s): VITAMINB12, FOLATE, FERRITIN, TIBC, IRON, RETICCTPCT in the last 72 hours. Sepsis Labs:  Recent Labs Lab 09/24/15 0845  LATICACIDVEN 1.6    Recent Results (from the past 240 hour(s))  Culture, blood (Routine X 2) w Reflex to ID Panel     Status: None   Collection Time: 09/21/15  5:35 PM  Result Value Ref Range Status   Specimen Description BLOOD RIGHT ARM  Final   Special Requests BOTTLES DRAWN AEROBIC AND ANAEROBIC  5CC, 10CC  Final   Culture   Final    NO GROWTH 5 DAYS Performed at [facility]    Report Status 09/26/2015 FINAL  Final  Culture, blood (Routine X 2) w Reflex to ID Panel     Status: None   Collection Time: 09/21/15  5:45 PM  Result Value Ref Range Status   Specimen Description BLOOD RIGHT ARM  Final   Special Requests IN PEDIATRIC BOTTLE  4CC  Final   Culture   Final    NO GROWTH 5 DAYS Performed at [facility]    Report Status 09/26/2015 FINAL  Final  Culture, blood (single)     Status: Abnormal   Collection Time: 09/24/15  9:20 AM  Result Value Ref Range Status   Specimen Description BLOOD LEFT ARM  10 ML IN [facility] BOTTLE  Final   Special Requests NONE  Final   Culture  Setup Time   Final    GRAM POSITIVE COCCI IN CLUSTERS ANAEROBIC BOTTLE ONLY CRITICAL RESULT CALLED TO, READ BACK BY AND VERIFIED WITH: Damian Leavell PHARMD AT [id number] 09/25/15  L. BENFIELD    Culture (A)  Final    STAPHYLOCOCCUS AUREUS SUSCEPTIBILITIES PERFORMED ON PREVIOUS CULTURE WITHIN THE LAST 5 DAYS. Performed at [facility]    Report Status 09/27/2015 FINAL  Final  Culture, blood (single)     Status: Abnormal   Collection Time: 09/24/15  9:45 AM  Result Value Ref Range  Status   Specimen Description BLOOD PICC  10 ML IN [facility] BOTTLE  Final   Special Requests NONE  Final   Culture  Setup Time   Final    GRAM POSITIVE COCCI IN CLUSTERS ANAEROBIC BOTTLE ONLY CRITICAL RESULT CALLED TO, READ BACK BY AND VERIFIED WITH: Jerilynn Mages FRENS,PHARMD AT 6195 09/25/15 BY L BENFIELD Performed at [place] (A)  Final   Report Status 09/27/2015 FINAL  Final   Organism ID, Bacteria METHICILLIN RESISTANT STAPHYLOCOCCUS AUREUS  Final      Susceptibility   Methicillin resistant staphylococcus aureus - MIC*    CIPROFLOXACIN >=8 RESISTANT Resistant     ERYTHROMYCIN >=8 RESISTANT Resistant     GENTAMICIN <=0.5 SENSITIVE Sensitive     OXACILLIN >=4 RESISTANT Resistant     TETRACYCLINE <=1 SENSITIVE Sensitive     VANCOMYCIN 1 SENSITIVE Sensitive     TRIMETH/SULFA <=10 SENSITIVE Sensitive     CLINDAMYCIN <=0.25 SENSITIVE Sensitive     RIFAMPIN <=0.5 SENSITIVE Sensitive     Inducible Clindamycin NEGATIVE Sensitive     * METHICILLIN RESISTANT STAPHYLOCOCCUS AUREUS  Blood Culture ID Panel (Reflexed)     Status: Abnormal   Collection Time: 09/24/15  9:45 AM  Result Value Ref Range Status   Enterococcus species NOT DETECTED NOT DETECTED Final   Vancomycin resistance NOT DETECTED NOT DETECTED Final   Listeria monocytogenes NOT DETECTED NOT DETECTED Final   Staphylococcus species DETECTED (A) NOT DETECTED Final    Comment: CRITICAL RESULT CALLED TO, READ BACK BY AND VERIFIED WITH: M FRENS,PHARMD AT 0932 09/25/15 BY L BENFIELD    Staphylococcus aureus DETECTED (A) NOT DETECTED Final    Comment: CRITICAL RESULT  CALLED TO, READ BACK BY AND VERIFIED WITH: M FRENS,PHARMD AT 6712 09/25/15 BY L BENFIELD    Methicillin resistance DETECTED (A) NOT DETECTED Final    Comment: CRITICAL RESULT CALLED TO, READ BACK BY AND VERIFIED WITH: Edson Snowball AT 4580 09/25/15 BY L BENFIELD    Streptococcus species NOT DETECTED NOT DETECTED Final   Streptococcus agalactiae NOT DETECTED NOT DETECTED Final   Streptococcus pneumoniae NOT DETECTED NOT DETECTED Final   Streptococcus pyogenes NOT DETECTED NOT DETECTED Final   Acinetobacter baumannii NOT DETECTED NOT DETECTED Final   Enterobacteriaceae species NOT DETECTED NOT DETECTED Final   Enterobacter cloacae complex NOT DETECTED NOT DETECTED Final   Escherichia coli NOT DETECTED NOT DETECTED Final   Klebsiella oxytoca NOT DETECTED NOT DETECTED Final   Klebsiella pneumoniae NOT DETECTED NOT DETECTED Final   Proteus species NOT DETECTED NOT DETECTED Final   Serratia marcescens NOT DETECTED NOT DETECTED Final   Carbapenem resistance NOT DETECTED NOT DETECTED Final   Haemophilus influenzae NOT DETECTED NOT DETECTED Final   Neisseria meningitidis NOT DETECTED NOT DETECTED Final   Pseudomonas aeruginosa NOT DETECTED NOT DETECTED Final   Candida albicans NOT DETECTED NOT DETECTED Final   Candida glabrata NOT DETECTED NOT DETECTED Final   Candida krusei NOT DETECTED NOT DETECTED Final   Candida parapsilosis NOT DETECTED NOT DETECTED Final   Candida tropicalis NOT DETECTED NOT DETECTED Final    Comment: Performed at [facility]  Culture, Urine     Status: Abnormal   Collection Time: 09/24/15  2:55 PM  Result Value Ref Range Status   Specimen Description URINE, RANDOM  Final   Special Requests NONE  Final   Culture (A)  Final    <10,000 COLONIES/mL INSIGNIFICANT GROWTH Performed at [facility]    Report Status 09/25/2015 FINAL  Final  Culture, blood (Routine X 2) w Reflex to ID Panel     Status: None (Preliminary result)   Collection Time: 09/26/15   1:11 PM  Result Value Ref Range Status   Specimen Description BLOOD RIGHT ARM  Final   Special Requests IN PEDIATRIC BOTTLE 0.5 CC  Final   Culture   Final    NO GROWTH 4 DAYS Performed at [facility]    Report Status PENDING  Incomplete  Culture, blood (Routine X 2) w Reflex to ID Panel     Status: None (Preliminary result)   Collection Time: 09/26/15  1:11 PM  Result Value Ref Range Status   Specimen Description BLOOD LEFT HAND  Final   Special Requests IN PEDIATRIC BOTTLE 1 CC  Final   Culture   Final    NO GROWTH 4 DAYS Performed at [facility]    Report Status PENDING  Incomplete  MRSA PCR Screening     Status: None   Collection Time: 09/28/15  7:31 AM  Result Value Ref Range Status   MRSA by PCR NEGATIVE NEGATIVE Final    Comment:        The GeneXpert MRSA Assay (FDA approved for NASAL specimens only), is one component of a comprehensive MRSA colonization surveillance program. It is not intended to diagnose MRSA infection nor to guide or monitor treatment for MRSA infections.          Radiology Studies: No results found.      Scheduled Meds: . antiseptic oral rinse  7 mL Mouth Rinse q12n4p  . chlorhexidine  15 mL Mouth Rinse BID  . dexamethasone  2 mg Oral Q12H  . docusate sodium  100 mg Oral BID  . feeding supplement (PRO-STAT SUGAR FREE 64)  30 mL Oral TID  . fentaNYL  50 mcg Transdermal Q72H  . gabapentin  100 mg Oral QHS  . lidocaine  1 patch Transdermal Q24H  . pantoprazole  40 mg Oral Daily  . polyethylene glycol  17 g Oral Daily  . sodium chloride  1,000 mL Intravenous Once  . sodium chloride  1,000 mL Intravenous Once  . sodium chloride flush  3 mL Intravenous Q12H  . vancomycin  1,000 mg Intravenous Q24H   Continuous Infusions:     LOS: 6 days    Time spent: 25 minutes.     Florencia Reasons, MD PhD [facility] Pager [phone]  If 7PM-7AM, please contact night-coverage [email or web address] Password [email or web address] 09/30/2015,  2:10 PM

## 2015-10-01 LAB — CULTURE, BLOOD (ROUTINE X 2)
CULTURE: NO GROWTH
CULTURE: NO GROWTH

## 2015-10-01 LAB — PROTIME-INR
INR: 1.8 — ABNORMAL HIGH (ref 0.00–1.49)
Prothrombin Time: 20.2 seconds — ABNORMAL HIGH (ref 11.6–15.2)

## 2015-10-01 MED ORDER — MAGIC MOUTHWASH W/LIDOCAINE
10.0000 mL | Freq: Three times a day (TID) | ORAL | Status: DC
  Administered 2015-10-01 – 2015-10-04 (×8): 10 mL via ORAL
  Filled 2015-10-01 (×12): qty 10

## 2015-10-01 NOTE — Progress Notes (Signed)
[name]   DOB:03-29-1946   QM#:[id number]   GQB#:[id number]  Subjective: [name] is comfortable--wants to walk with her walker but "they won't let me." She tells me stool softeners give her diarrhea and she won't take them. Her mouth burns (from the cepacol?  Not clear what causes it). Otherwise she is "a lot better." No family in room  Objective: older  White woman examined in bed  Filed Vitals:   09/30/15 2148 10/01/15 0532  BP: 112/86 137/83  Pulse: 72 77  Temp: 98.3 F (36.8 C) 98 F (36.7 C)  Resp: 18 18    Body mass index is 22.71 kg/(m^2).  Intake/Output Summary (Last 24 hours) at 10/01/15 1004 Last data filed at 10/01/15 0900  Gross per 24 hour  Intake    960 ml  Output      0 ml  Net    960 ml     Sclerae unicteric  Oro[pharynx shows no ulcers or thrush  Lungs no rales or wheezes  Heart regular rate and rhythm  Abdomen benign  Neuro nonfocal, positive and feisty affect (her baseline)  Breast exam: deferred  CBG (last 3)  No results for input(s): GLUCAP in the last 72 hours.   Labs:  Lab Results  Component Value Date   WBC 12.0* 09/30/2015   HGB 10.0* 09/30/2015   HCT 30.9* 09/30/2015   MCV 99.4 09/30/2015   PLT 199 09/30/2015   NEUTROABS 6.3 09/06/2015    @LASTCHEMISTRY @  Urine Studies No results for input(s): UHGB, CRYS in the last 72 hours.  Invalid input(s): UACOL, UAPR, USPG, UPH, UTP, UGL, UKET, UBIL, UNIT, UROB, [place], UEPI, UWBC, [place], [place], [place], [place], [place]  Basic Metabolic Panel:  Recent Labs Lab 09/25/15 0500 09/26/15 0423 09/27/15 0432 09/28/15 0439 09/30/15 0425  NA 140 138 139 139 141  K 4.4 4.3 3.5 4.2 4.2  CL 109 106 104 103 105  CO2 27 27 26 30 28   GLUCOSE 120* 101* 114* 126* 120*  BUN 30* 21* 20 25* 24*  CREATININE 0.82 0.54 0.62 0.71 0.64  CALCIUM 7.6* 8.6* 8.1* 7.6* 8.1*  MG  --   --   --  1.3* 1.5*   GFR Estimated Creatinine Clearance: 51 mL/min (by C-G formula based on Cr of 0.64). Liver Function  Tests:  Recent Labs Lab 09/25/15 0500  AST 15  ALT 28  ALKPHOS 121  BILITOT 0.5  PROT 4.7*  ALBUMIN 1.9*   No results for input(s): LIPASE, AMYLASE in the last 168 hours. No results for input(s): AMMONIA in the last 168 hours. Coagulation profile  Recent Labs Lab 09/27/15 0432 09/28/15 0439 09/29/15 0420 09/30/15 0425 10/01/15 0435  INR 1.86* 1.76* 2.20* 2.23* 1.80*    CBC:  Recent Labs Lab 09/25/15 0500 09/26/15 0423 09/27/15 0432 09/28/15 0439 09/30/15 0425  WBC 10.6* 11.2* 9.7 9.8 12.0*  HGB 6.8* 10.2* 10.0* 10.1* 10.0*  HCT 21.0* 30.5* 30.0* 30.4* 30.9*  MCV 102.4* 96.8 96.8 97.4 99.4  PLT 186 173 181 197 199   Cardiac Enzymes: No results for input(s): CKTOTAL, CKMB, CKMBINDEX, TROPONINI in the last 168 hours. BNP: Invalid input(s): POCBNP CBG: No results for input(s): GLUCAP in the last 168 hours. D-Dimer No results for input(s): DDIMER in the last 72 hours. Hgb A1c No results for input(s): HGBA1C in the last 72 hours. Lipid Profile No results for input(s): CHOL, HDL, LDLCALC, TRIG, CHOLHDL, LDLDIRECT in the last 72 hours. Thyroid function studies No results for input(s): TSH, T4TOTAL,  T3FREE, THYROIDAB in the last 72 hours.  Invalid input(s): FREET3 Anemia work up No results for input(s): VITAMINB12, FOLATE, FERRITIN, TIBC, IRON, RETICCTPCT in the last 72 hours. Microbiology Recent Results (from the past 240 hour(s))  Culture, blood (Routine X 2) w Reflex to ID Panel     Status: None   Collection Time: 09/21/15  5:35 PM  Result Value Ref Range Status   Specimen Description BLOOD RIGHT ARM  Final   Special Requests BOTTLES DRAWN AEROBIC AND ANAEROBIC  5CC, 10CC  Final   Culture   Final    NO GROWTH 5 DAYS Performed at [facility]    Report Status 09/26/2015 FINAL  Final  Culture, blood (Routine X 2) w Reflex to ID Panel     Status: None   Collection Time: 09/21/15  5:45 PM  Result Value Ref Range Status   Specimen Description  BLOOD RIGHT ARM  Final   Special Requests IN PEDIATRIC BOTTLE  4CC  Final   Culture   Final    NO GROWTH 5 DAYS Performed at [facility]    Report Status 09/26/2015 FINAL  Final  Culture, blood (single)     Status: Abnormal   Collection Time: 09/24/15  9:20 AM  Result Value Ref Range Status   Specimen Description BLOOD LEFT ARM  10 ML IN [facility] BOTTLE  Final   Special Requests NONE  Final   Culture  Setup Time   Final    GRAM POSITIVE COCCI IN CLUSTERS ANAEROBIC BOTTLE ONLY CRITICAL RESULT CALLED TO, READ BACK BY AND VERIFIED WITH: Damian Leavell PHARMD AT 2863 09/25/15 L. BENFIELD    Culture (A)  Final    STAPHYLOCOCCUS AUREUS SUSCEPTIBILITIES PERFORMED ON PREVIOUS CULTURE WITHIN THE LAST 5 DAYS. Performed at [facility]    Report Status 09/27/2015 FINAL  Final  Culture, blood (single)     Status: Abnormal   Collection Time: 09/24/15  9:45 AM  Result Value Ref Range Status   Specimen Description BLOOD PICC  10 ML IN [facility] BOTTLE  Final   Special Requests NONE  Final   Culture  Setup Time   Final    GRAM POSITIVE COCCI IN CLUSTERS ANAEROBIC BOTTLE ONLY CRITICAL RESULT CALLED TO, READ BACK BY AND VERIFIED WITH: Jerilynn Mages FRENS,PHARMD AT [id number] 09/25/15 BY L BENFIELD Performed at [facility]    Culture METHICILLIN RESISTANT STAPHYLOCOCCUS AUREUS (A)  Final   Report Status 09/27/2015 FINAL  Final   Organism ID, Bacteria METHICILLIN RESISTANT STAPHYLOCOCCUS AUREUS  Final      Susceptibility   Methicillin resistant staphylococcus aureus - MIC*    CIPROFLOXACIN >=8 RESISTANT Resistant     ERYTHROMYCIN >=8 RESISTANT Resistant     GENTAMICIN <=0.5 SENSITIVE Sensitive     OXACILLIN >=4 RESISTANT Resistant     TETRACYCLINE <=1 SENSITIVE Sensitive     VANCOMYCIN 1 SENSITIVE Sensitive     TRIMETH/SULFA <=10 SENSITIVE Sensitive     CLINDAMYCIN <=0.25 SENSITIVE Sensitive     RIFAMPIN <=0.5 SENSITIVE Sensitive     Inducible Clindamycin NEGATIVE Sensitive     * METHICILLIN  RESISTANT STAPHYLOCOCCUS AUREUS  Blood Culture ID Panel (Reflexed)     Status: Abnormal   Collection Time: 09/24/15  9:45 AM  Result Value Ref Range Status   Enterococcus species NOT DETECTED NOT DETECTED Final   Vancomycin resistance NOT DETECTED NOT DETECTED Final   Listeria monocytogenes NOT DETECTED NOT DETECTED Final   Staphylococcus species DETECTED (A) NOT DETECTED Final  Comment: CRITICAL RESULT CALLED TO, READ BACK BY AND VERIFIED WITH: M FRENS,PHARMD AT 5462 09/25/15 BY L BENFIELD    Staphylococcus aureus DETECTED (A) NOT DETECTED Final    Comment: CRITICAL RESULT CALLED TO, READ BACK BY AND VERIFIED WITH: M FRENS,PHARMD AT 7035 09/25/15 BY L BENFIELD    Methicillin resistance DETECTED (A) NOT DETECTED Final    Comment: CRITICAL RESULT CALLED TO, READ BACK BY AND VERIFIED WITH: Edson Snowball AT 0093 09/25/15 BY L BENFIELD    Streptococcus species NOT DETECTED NOT DETECTED Final   Streptococcus agalactiae NOT DETECTED NOT DETECTED Final   Streptococcus pneumoniae NOT DETECTED NOT DETECTED Final   Streptococcus pyogenes NOT DETECTED NOT DETECTED Final   Acinetobacter baumannii NOT DETECTED NOT DETECTED Final   Enterobacteriaceae species NOT DETECTED NOT DETECTED Final   Enterobacter cloacae complex NOT DETECTED NOT DETECTED Final   Escherichia coli NOT DETECTED NOT DETECTED Final   Klebsiella oxytoca NOT DETECTED NOT DETECTED Final   Klebsiella pneumoniae NOT DETECTED NOT DETECTED Final   Proteus species NOT DETECTED NOT DETECTED Final   Serratia marcescens NOT DETECTED NOT DETECTED Final   Carbapenem resistance NOT DETECTED NOT DETECTED Final   Haemophilus influenzae NOT DETECTED NOT DETECTED Final   Neisseria meningitidis NOT DETECTED NOT DETECTED Final   Pseudomonas aeruginosa NOT DETECTED NOT DETECTED Final   Candida albicans NOT DETECTED NOT DETECTED Final   Candida glabrata NOT DETECTED NOT DETECTED Final   Candida krusei NOT DETECTED NOT DETECTED Final   Candida  parapsilosis NOT DETECTED NOT DETECTED Final   Candida tropicalis NOT DETECTED NOT DETECTED Final    Comment: Performed at [facility]  Culture, Urine     Status: Abnormal   Collection Time: 09/24/15  2:55 PM  Result Value Ref Range Status   Specimen Description URINE, RANDOM  Final   Special Requests NONE  Final   Culture (A)  Final    <10,000 COLONIES/mL INSIGNIFICANT GROWTH Performed at [facility]    Report Status 09/25/2015 FINAL  Final  Culture, blood (Routine X 2) w Reflex to ID Panel     Status: None (Preliminary result)   Collection Time: 09/26/15  1:11 PM  Result Value Ref Range Status   Specimen Description BLOOD RIGHT ARM  Final   Special Requests IN PEDIATRIC BOTTLE 0.5 CC  Final   Culture   Final    NO GROWTH 4 DAYS Performed at [facility]    Report Status PENDING  Incomplete  Culture, blood (Routine X 2) w Reflex to ID Panel     Status: None (Preliminary result)   Collection Time: 09/26/15  1:11 PM  Result Value Ref Range Status   Specimen Description BLOOD LEFT HAND  Final   Special Requests IN PEDIATRIC BOTTLE 1 CC  Final   Culture   Final    NO GROWTH 4 DAYS Performed at [facility]    Report Status PENDING  Incomplete  MRSA PCR Screening     Status: None   Collection Time: 09/28/15  7:31 AM  Result Value Ref Range Status   MRSA by PCR NEGATIVE NEGATIVE Final    Comment:        The GeneXpert MRSA Assay (FDA approved for NASAL specimens only), is one component of a comprehensive MRSA colonization surveillance program. It is not intended to diagnose MRSA infection nor to guide or monitor treatment for MRSA infections.       Studies:  No results found.  Assessment:  70 y.o. [place], [place] woman status post right lumpectomy and axillary lymph node dissection December 2006 for a TX N2, stage IIIA invasive ductal carcinoma, estrogen and progesterone receptor positive, with subsequent stage IV disease  (1) received  adjuvant doxorubicin, docetaxel and cyclophosphamide  (2) status post adjuvant radiation  (3) on tamoxifen to September 2011 when she was noted to have metastatic disease to bone  (4) fulvestrant 02/06/2009 to 01/15/2011  (5) left axillary lymph node positive for an invasive ductal carcinoma, estrogen receptor and HER-2 negative November 2011, and a right breast recurrence for an estrogen receptor positive lesion at the same time.  (6) status post bilateral mastectomies 06/28/2010  (7) status post left chest and axillary radiation  (8) status post right chest wall recurrence October of 2012, treated with electron beam radiation completed December 2012  (9) progression in bone August 2013,  (10) status post radiation to the lumbosacral spine completed October 2013   (11) capecitabine November 2013 through January 2014  (12) tamoxifen/ everolimus February 2014, discontinued November 2014 with progression  (13) Megace 80 mg twice daily with progression January 2015  (14) Radiation to lumbar spine February 2015  (15) letrozole plus Ibrance started 07/08/2013, discontinued May 2017 with progression  (16). Declines genetic testing (as of 08/17/2013)  (17) zolendronate 05/01/2009, discontinued because of pain; Xgeva started 10/08/2013, discontinued because of cost issues as of November 2015, switched to alendronate March 2016, switched back to [place] but stopped after the May 2016 dose because of side effects, on ibandronate [125 mg/ day 21 days on, 7 days off] temporarily, denosumab to be resumed 10/02/2015  (18) cyclophosphamide, methotrexate, and fluorouracil to be started 10/09/2015, repeated every 21 days.  (19) palliative radiation to be completed 10/02/2015    Plan:  She has refused laxatives/stool softeners x 3 days and it is becoming an issue w staff so I am discontinuing those agents.  She c/o mouth irritation with the rinses. Have d/c'd all but magic mouthwash and made  that TID  Blood cultures from 09/26/2015 still negative  She is ready for port tomorrow and completes her radiation treatments tomorrow as well. If all her supports are available at home she could be d/c's, but more likely it will be 06/06 Tuesday-- one issue still unresolved is the high steps x5 she needs to navigate to get into her home (her "niece" has been trying to arrange for a ramp)  Would resume coumadin tomorrow PM if no bleeding post port placement. Will follow with you  Greatly appreciate your help to this patient! Chauncey Cruel, MD 10/01/2015  10:04 AM Medical Oncology and Hematology [facility] [address] [place], [place] [address] Tel. [phone]    Fax. [phone]

## 2015-10-01 NOTE — Progress Notes (Signed)
Pharmacy Antibiotic Note  [name] is a 70 y.o. female known to Pharmacy from previous admission now readmitted on 09/24/2015.  She was just discharged from this hospital on 09/22/2015 and sent to SNF for management of MRSA bacteremia. The plan was for her to complete a 4 week course of IV vancomycin from 09/19/2015. She left SNF AMA on 09/23/15, reporting that she was not receiving pain medications or her antibiotics as ordered. She was taken home by a friend where she became profoundly weak and was found down and subsequently brought to the ED at [facility] in [place], then transferred to [facility]. Pt initially started on Vancomycin, Levaquin, and Zosyn, then narrowed to Vancomycin alone per ID.   PICC removed 5/29, plan for port placement Monday 6/4  Today, 10/01/2015  Day #6 of 28 vancomycin (starting DOT from 5/30 - 1st BCx wiith NGTD)  SCr stable, no issues  WBC sl elevated 6/3  Afebrile  Plan: Continue Vancomycin 1g IV q24h Recheck trough tomorrow AM Note plans for port 6/4 Monitor renal function and cultures  Height: 5\' 2"  (157.5 cm) Weight: 124 lb 3.2 oz (56.337 kg) IBW/kg (Calculated) : 50.1  Temp (24hrs), Avg:98.2 F (36.8 C), Min:98 F (36.7 C), Max:98.3 F (36.8 C)   Recent Labs Lab 09/25/15 0500 09/26/15 0423 09/27/15 0432 09/28/15 0439 09/28/15 0914 09/30/15 0425  WBC 10.6* 11.2* 9.7 9.8  --  12.0*  CREATININE 0.82 0.54 0.62 0.71  --  0.64  VANCOTROUGH  --   --   --   --  13  --     Estimated Creatinine Clearance: 51 mL/min (by C-G formula based on Cr of 0.64).    Allergies  Allergen Reactions  . Aspirin Nausea And Vomiting  . Boost Pudding [Nutritional Supplements] Diarrhea  . Percodan [Oxycodone-Aspirin] Nausea Only  . Codeine Nausea Only    Antimicrobials: 5/18 Cefepime >> 5/23 5/23 Ancef >> 5/23 5/18 Vancomycin >> 5/21, resume 5/23 >> 5/28 Zosyn >> 5/29 5/28 Levaquin >>5/29  Levels/dose changes this admission: 5/25 VT at 1730 =  11  on 500 mg q12h - increase to 750mg  q12 6/1 VT @ 0900 = 13 on 1gm IV q24h (prior to 5th dose)  Microbiology results: BCx from [facility] growing MRSA 2/2 bottles, ID consulted 5/18 BCx x2:  NGF 5/18 HIV antb: NR 5/18 ur strep pneu: neg 5/18 ur legionella: neg 5/25 BCx (repeat): ngtd  Cultures this Admit: 5/28 BCx:  MRSA (Vanco MIC = 1); BCID = MRSA 5/28 UCx: <10K insig growth. 5/30 BCx2: NGTD 6/1 MRSA PCR negative  Thank you for allowing pharmacy to be a part of this patient's care.  Ralene Bathe, PharmD, BCPS 10/01/2015, 10:30 AM  Pager: [phone]

## 2015-10-01 NOTE — Plan of Care (Signed)
Problem: Nutrition: Goal: Adequate nutrition will be maintained Outcome: Completed/Met Date Met:  10/01/15 Patient has been able to tolerate PO's, will continue to monitor

## 2015-10-01 NOTE — Progress Notes (Signed)
PROGRESS NOTE    [name]  SWF:[id number] DOB: 21-Jun-1945 DOA: 09/24/2015 PCP: Curlene Labrum, MD    Brief Narrative: [name] is a 70 y.o. woman with a history of metastatic breast cancer, bone pain anticipating palliative radiation therapy, severe protein calorie malnutrition with adult failure to thrive, HTN, and GERD who was just discharged from this hospital on 09/22/2015 and sent to SNF for management of MRSA bacteremia. The plan was for her to complete a 4 week course of IV vancomycin from 09/19/2015. She left SNF AMA at some point yesterday, reporting that she was not receiving pain medications or her antibiotics as ordered. She was taken home by a friend (without access to supplemental oxygen). She became profoundly weak and was found down. She insists that she did no lose consciousness; she was just too weak to get herself up. She complains of chronic pain at this point. She had left sided pleuritic chest pain that improved with oxygen. She reports that she was only SOB when she was off of oxygen. Again, no LOC. No evidence of acute blood loss. No nausea or vomiting ("I don't know why they gave me Zofran at the other hospital."). 1-2 loose stools daily.  ED Course: The patient was evaluated in the ED at [facility]. Pertinent findings include new leukocytosis compared to discharge and evidence of acute kidney injury. No documented fever. Alk phos remains elevated, but LFTs have otherwise improved. Protein levels remain low. CT chest interpreted as multifocal pneumonia; I suspect this represents the same process that was identified here by chest CT on Sep 20, 2015.   Assessment & Plan:   Principal Problem:   MRSA bacteremia Active Problems:   GERD   Personal history of thrombophlebitis   Breast cancer metastasized to bone (HCC)   AKI (acute kidney injury) ([place])   Essential hypertension   Protein-calorie malnutrition, severe (HCC)   Pain from bone  metastases (HCC)   Generalized abdominal pain   Elevated LFTs   Rib pain on left side   Acute septic pulmonary embolism (HCC)   Acute respiratory failure with hypoxia (HCC)   Leukocytosis   Necrotizing pneumonia (HCC)   PICC line infection   Goals of care, counseling/discussion   Cavitary pneumonia  MRSA bacteremia with HCAP and probable septic embolic to the lung with new leukocytosis compared to discharge -- urine culture insignificant growth, blood cultures from 5/28 +MRSA 2/2, repeat blood culture from 5/30, no growth continue  -picc line placed from previously hospitalization removed on 5/30 -continue Vancomycin which was started from admission, she received levaquin, and zosyn initially on admission -TEE no vegetation on 5/25 from previous hospitalization, no need to repeat TEE, port placement early next week per ID recommendation, plan for iv abx till 6/27-appreciate ID input Coumadin held since 6/2 in preparation for port placement on 6/5, need to be npo aftermidnight on Sunday.   Hypoxia: o2 dropped to 82 on room air  While ambulating, will need arrange home o2.  History of metastatic breast cancer with bone pain: Recent PET scan on 08/31/15 showed numerous osseous mets-followed by Dr [name]. Recent MRI entire spine on 4/28 showed multiple osseous spinal mets with no cord compression. Plan for port placement and chemotherapy postponed with last admission due to acute illness.  Palliative radiation therapy anticipated for skull, left rib cage, and right tibial lesions. Discussed with Dr [name] patient will received localized radiation, ok to proceed to prevents complication meningion carcinomatosis.  pateint to received total  of 5 days xrt, Last xrt on 6/5 per rad onc. Dr [name] plan to start first chemo on 6/12  History of abnormal LFTs --Overall improved. No evidence of obvious liver mets on recent imaging. Continue to monitor.  Chronic pain --Fentanyl patch, MSIR  prn (may need to change to dilaudid if renal failure persists)  GERD --Continue PPI  AKI with relative hypotension --Will aggressively hydrate and monitor for now --If renal function does not improve, will need further work-up --Pharmacy to renally dose medications  Acute on Chronic anemia --Hgb drop to 6,8 0n 5/29. Received   2 units PRBC 5-30. Hb increase to 10/  Hold coumadin held on 5/29 and 5/30, hgb has been stable, coumadin resumed on 5/32.  Stool guaiac pending collection.  Chronic anticoagulation for chronic, extensive superficial thrombophlebitis coumadin held on 5/29 and 5/30, due to drop of hemoglobin on 5/29.  hgb stabilized after prbc transfusion, no overt sign of bleed, coumadin resumed on 5/31 target on lower end of therapeutic range, monitor hgb, inr, Patient does has bilateral lower extremity edema, venous US no dvt, trial of lasix on 5/31 Coumadin held  Since 6/2 in ancitipating port placement on 6/5.  History of hypertension but now showing relative hypotension --s/p IV fluids.  --Currently asymptomatic  Anorexia: Likely secondary to underlying malignancy worsened by pneumonia.   Protein-calorie malnutrition, severe: Continue supplements    DVT prophylaxis: coumadin held for procedure on monday Code Status: DNR Family Communication: care discussed with patient  Disposition Plan: home with home health, home oxygen early next week   Consultants:   ID  Oncology  Rad onc  IR   Procedures:   Removal of picc line on 5/30  Medi Port placement on 6/5  Antimicrobials:   Vancomycin    Subjective: Sitting in chair, not in distress,   Objective: Filed Vitals:   09/30/15 1400 09/30/15 2148 10/01/15 0532 10/01/15 1000  BP: 107/72 112/86 137/83 113/59  Pulse: 74 72 77 70  Temp: 98 F (36.7 C) 98.3 F (36.8 C) 98 F (36.7 C) 98.3 F (36.8 C)  TempSrc: Oral Oral Oral Oral  Resp: '18 18 18 20  '$ Height:      Weight:      SpO2: 99% 96% 91%  95%    Intake/Output Summary (Last 24 hours) at 10/01/15 1037 Last data filed at 10/01/15 0900  Gross per 24 hour  Intake    960 ml  Output      0 ml  Net    960 ml   Filed Weights   09/24/15 0605  Weight: 56.337 kg (124 lb 3.2 oz)    Examination:  General exam: Appears calm and comfortable  Respiratory system: Clear to auscultation. Respiratory effort normal. Cardiovascular system: S1 & S2 heard, RRR. No JVD, murmurs, rubs, gallops or clicks. Less  pedal edema. Gastrointestinal system: Abdomen is nondistended, soft and nontender. No organomegaly or masses felt. Normal bowel sounds heard. Central nervous system: Alert and oriented. No focal neurological deficits. Extremities: Symmetric 5 x 5 power.  Skin: No rashes, lesions or ulcers     Data Reviewed: I have personally reviewed following labs and imaging studies  CBC:  Recent Labs Lab 09/25/15 0500 09/26/15 0423 09/27/15 0432 09/28/15 0439 09/30/15 0425  WBC 10.6* 11.2* 9.7 9.8 12.0*  HGB 6.8* 10.2* 10.0* 10.1* 10.0*  HCT 21.0* 30.5* 30.0* 30.4* 30.9*  MCV 102.4* 96.8 96.8 97.4 99.4  PLT 186 173 181 197 884   Basic Metabolic Panel:  Recent Labs Lab 09/25/15 0500 09/26/15 0423 09/27/15 0432 09/28/15 0439 09/30/15 0425  NA 140 138 139 139 141  K 4.4 4.3 3.5 4.2 4.2  CL 109 106 104 103 105  CO2 '27 27 26 30 28  '$ GLUCOSE 120* 101* 114* 126* 120*  BUN 30* 21* 20 25* 24*  CREATININE 0.82 0.54 0.62 0.71 0.64  CALCIUM 7.6* 8.6* 8.1* 7.6* 8.1*  MG  --   --   --  1.3* 1.5*   GFR: Estimated Creatinine Clearance: 51 mL/min (by C-G formula based on Cr of 0.64). Liver Function Tests:  Recent Labs Lab 09/25/15 0500  AST 15  ALT 28  ALKPHOS 121  BILITOT 0.5  PROT 4.7*  ALBUMIN 1.9*   No results for input(s): LIPASE, AMYLASE in the last 168 hours. No results for input(s): AMMONIA in the last 168 hours. Coagulation Profile:  Recent Labs Lab 09/27/15 0432 09/28/15 0439 09/29/15 0420 09/30/15 0425  10/01/15 0435  INR 1.86* 1.76* 2.20* 2.23* 1.80*   Cardiac Enzymes: No results for input(s): CKTOTAL, CKMB, CKMBINDEX, TROPONINI in the last 168 hours. BNP (last 3 results) No results for input(s): PROBNP in the last 8760 hours. HbA1C: No results for input(s): HGBA1C in the last 72 hours. CBG: No results for input(s): GLUCAP in the last 168 hours. Lipid Profile: No results for input(s): CHOL, HDL, LDLCALC, TRIG, CHOLHDL, LDLDIRECT in the last 72 hours. Thyroid Function Tests: No results for input(s): TSH, T4TOTAL, FREET4, T3FREE, THYROIDAB in the last 72 hours. Anemia Panel: No results for input(s): VITAMINB12, FOLATE, FERRITIN, TIBC, IRON, RETICCTPCT in the last 72 hours. Sepsis Labs: No results for input(s): PROCALCITON, LATICACIDVEN in the last 168 hours.  Recent Results (from the past 240 hour(s))  Culture, blood (Routine X 2) w Reflex to ID Panel     Status: None   Collection Time: 09/21/15  5:35 PM  Result Value Ref Range Status   Specimen Description BLOOD RIGHT ARM  Final   Special Requests BOTTLES DRAWN AEROBIC AND ANAEROBIC  5CC, 10CC  Final   Culture   Final    NO GROWTH 5 DAYS Performed at [facility]    Report Status 09/26/2015 FINAL  Final  Culture, blood (Routine X 2) w Reflex to ID Panel     Status: None   Collection Time: 09/21/15  5:45 PM  Result Value Ref Range Status   Specimen Description BLOOD RIGHT ARM  Final   Special Requests IN PEDIATRIC BOTTLE  4CC  Final   Culture   Final    NO GROWTH 5 DAYS Performed at [facility]    Report Status 09/26/2015 FINAL  Final  Culture, blood (single)     Status: Abnormal   Collection Time: 09/24/15  9:20 AM  Result Value Ref Range Status   Specimen Description BLOOD LEFT ARM  10 ML IN [facility] BOTTLE  Final   Special Requests NONE  Final   Culture  Setup Time   Final    GRAM POSITIVE COCCI IN CLUSTERS ANAEROBIC BOTTLE ONLY CRITICAL RESULT CALLED TO, READ BACK BY AND VERIFIED WITH: Damian Leavell  PHARMD AT 4268 09/25/15 L. BENFIELD    Culture (A)  Final    STAPHYLOCOCCUS AUREUS SUSCEPTIBILITIES PERFORMED ON PREVIOUS CULTURE WITHIN THE LAST 5 DAYS. Performed at [facility]    Report Status 09/27/2015 FINAL  Final  Culture, blood (single)     Status: Abnormal   Collection Time: 09/24/15  9:45 AM  Result Value Ref Range  Status   Specimen Description BLOOD PICC  10 ML IN [facility] BOTTLE  Final   Special Requests NONE  Final   Culture  Setup Time   Final    GRAM POSITIVE COCCI IN CLUSTERS ANAEROBIC BOTTLE ONLY CRITICAL RESULT CALLED TO, READ BACK BY AND VERIFIED WITH: Jerilynn Mages FRENS,PHARMD AT 2355 09/25/15 BY L BENFIELD Performed at [facility] (A)  Final   Report Status 09/27/2015 FINAL  Final   Organism ID, Bacteria METHICILLIN RESISTANT STAPHYLOCOCCUS AUREUS  Final      Susceptibility   Methicillin resistant staphylococcus aureus - MIC*    CIPROFLOXACIN >=8 RESISTANT Resistant     ERYTHROMYCIN >=8 RESISTANT Resistant     GENTAMICIN <=0.5 SENSITIVE Sensitive     OXACILLIN >=4 RESISTANT Resistant     TETRACYCLINE <=1 SENSITIVE Sensitive     VANCOMYCIN 1 SENSITIVE Sensitive     TRIMETH/SULFA <=10 SENSITIVE Sensitive     CLINDAMYCIN <=0.25 SENSITIVE Sensitive     RIFAMPIN <=0.5 SENSITIVE Sensitive     Inducible Clindamycin NEGATIVE Sensitive     * METHICILLIN RESISTANT STAPHYLOCOCCUS AUREUS  Blood Culture ID Panel (Reflexed)     Status: Abnormal   Collection Time: 09/24/15  9:45 AM  Result Value Ref Range Status   Enterococcus species NOT DETECTED NOT DETECTED Final   Vancomycin resistance NOT DETECTED NOT DETECTED Final   Listeria monocytogenes NOT DETECTED NOT DETECTED Final   Staphylococcus species DETECTED (A) NOT DETECTED Final    Comment: CRITICAL RESULT CALLED TO, READ BACK BY AND VERIFIED WITH: M FRENS,PHARMD AT 7322 09/25/15 BY L BENFIELD    Staphylococcus aureus DETECTED (A) NOT DETECTED Final     Comment: CRITICAL RESULT CALLED TO, READ BACK BY AND VERIFIED WITH: M FRENS,PHARMD AT 0254 09/25/15 BY L BENFIELD    Methicillin resistance DETECTED (A) NOT DETECTED Final    Comment: CRITICAL RESULT CALLED TO, READ BACK BY AND VERIFIED WITH: Edson Snowball AT 2706 09/25/15 BY L BENFIELD    Streptococcus species NOT DETECTED NOT DETECTED Final   Streptococcus agalactiae NOT DETECTED NOT DETECTED Final   Streptococcus pneumoniae NOT DETECTED NOT DETECTED Final   Streptococcus pyogenes NOT DETECTED NOT DETECTED Final   Acinetobacter baumannii NOT DETECTED NOT DETECTED Final   Enterobacteriaceae species NOT DETECTED NOT DETECTED Final   Enterobacter cloacae complex NOT DETECTED NOT DETECTED Final   Escherichia coli NOT DETECTED NOT DETECTED Final   Klebsiella oxytoca NOT DETECTED NOT DETECTED Final   Klebsiella pneumoniae NOT DETECTED NOT DETECTED Final   Proteus species NOT DETECTED NOT DETECTED Final   Serratia marcescens NOT DETECTED NOT DETECTED Final   Carbapenem resistance NOT DETECTED NOT DETECTED Final   Haemophilus influenzae NOT DETECTED NOT DETECTED Final   Neisseria meningitidis NOT DETECTED NOT DETECTED Final   Pseudomonas aeruginosa NOT DETECTED NOT DETECTED Final   Candida albicans NOT DETECTED NOT DETECTED Final   Candida glabrata NOT DETECTED NOT DETECTED Final   Candida krusei NOT DETECTED NOT DETECTED Final   Candida parapsilosis NOT DETECTED NOT DETECTED Final   Candida tropicalis NOT DETECTED NOT DETECTED Final    Comment: Performed at [facility]  Culture, Urine     Status: Abnormal   Collection Time: 09/24/15  2:55 PM  Result Value Ref Range Status   Specimen Description URINE, RANDOM  Final   Special Requests NONE  Final   Culture (A)  Final    <10,000 COLONIES/mL INSIGNIFICANT GROWTH Performed at [facility]    Report Status 09/25/2015 FINAL  Final  Culture, blood (Routine X 2) w Reflex to ID Panel     Status: None (Preliminary result)    Collection Time: 09/26/15  1:11 PM  Result Value Ref Range Status   Specimen Description BLOOD RIGHT ARM  Final   Special Requests IN PEDIATRIC BOTTLE 0.5 CC  Final   Culture   Final    NO GROWTH 4 DAYS Performed at [facility]    Report Status PENDING  Incomplete  Culture, blood (Routine X 2) w Reflex to ID Panel     Status: None (Preliminary result)   Collection Time: 09/26/15  1:11 PM  Result Value Ref Range Status   Specimen Description BLOOD LEFT HAND  Final   Special Requests IN PEDIATRIC BOTTLE 1 CC  Final   Culture   Final    NO GROWTH 4 DAYS Performed at [facility]    Report Status PENDING  Incomplete  MRSA PCR Screening     Status: None   Collection Time: 09/28/15  7:31 AM  Result Value Ref Range Status   MRSA by PCR NEGATIVE NEGATIVE Final    Comment:        The GeneXpert MRSA Assay (FDA approved for NASAL specimens only), is one component of a comprehensive MRSA colonization surveillance program. It is not intended to diagnose MRSA infection nor to guide or monitor treatment for MRSA infections.          Radiology Studies: No results found.      Scheduled Meds: . dexamethasone  2 mg Oral Q12H  . feeding supplement (PRO-STAT SUGAR FREE 64)  30 mL Oral TID  . fentaNYL  50 mcg Transdermal Q72H  . gabapentin  100 mg Oral QHS  . lidocaine  1 patch Transdermal Q24H  . magic mouthwash w/lidocaine  10 mL Oral TID  . pantoprazole  40 mg Oral Daily  . sodium chloride  1,000 mL Intravenous Once  . sodium chloride  1,000 mL Intravenous Once  . sodium chloride flush  3 mL Intravenous Q12H  . vancomycin  1,000 mg Intravenous Q24H   Continuous Infusions:     LOS: 7 days    Time spent: 15 minutes.     Florencia Reasons, MD PhD [facility] Pager [phone]  If 7PM-7AM, please contact night-coverage [email or web address] Password [facility] 10/01/2015, 10:37 AM

## 2015-10-02 ENCOUNTER — Ambulatory Visit: Payer: Medicare PPO | Admitting: Oncology

## 2015-10-02 ENCOUNTER — Ambulatory Visit
Admit: 2015-10-02 | Discharge: 2015-10-02 | Disposition: A | Discharge status: Home / Self Care | Payer: Medicare PPO | Attending: Radiation Oncology | Admitting: Radiation Oncology

## 2015-10-02 ENCOUNTER — Other Ambulatory Visit: Payer: Medicare PPO

## 2015-10-02 ENCOUNTER — Inpatient Hospital Stay (HOSPITAL_COMMUNITY): Payer: Medicare PPO

## 2015-10-02 ENCOUNTER — Encounter: Payer: Self-pay | Admitting: Radiation Oncology

## 2015-10-02 ENCOUNTER — Ambulatory Visit: Payer: Medicare PPO

## 2015-10-02 ENCOUNTER — Telehealth: Payer: Self-pay | Admitting: Oncology

## 2015-10-02 DIAGNOSIS — C7951 Secondary malignant neoplasm of bone: Principal | ICD-10-CM

## 2015-10-02 DIAGNOSIS — C50919 Malignant neoplasm of unspecified site of unspecified female breast: Secondary | ICD-10-CM

## 2015-10-02 LAB — CBC
HEMATOCRIT: 31.1 % — AB (ref 36.0–46.0)
HEMOGLOBIN: 10.1 g/dL — AB (ref 12.0–15.0)
MCH: 32.1 pg (ref 26.0–34.0)
MCHC: 32.5 g/dL (ref 30.0–36.0)
MCV: 98.7 fL (ref 78.0–100.0)
Platelets: 192 10*3/uL (ref 150–400)
RBC: 3.15 MIL/uL — AB (ref 3.87–5.11)
RDW: 18.2 % — ABNORMAL HIGH (ref 11.5–15.5)
WBC: 11.6 10*3/uL — ABNORMAL HIGH (ref 4.0–10.5)

## 2015-10-02 LAB — CBC WITH DIFFERENTIAL/PLATELET
Basophils Absolute: 0 10*3/uL (ref 0.0–0.1)
Basophils Relative: 0 %
EOS ABS: 0 10*3/uL (ref 0.0–0.7)
EOS PCT: 0 %
HCT: 28.1 % — ABNORMAL LOW (ref 36.0–46.0)
Hemoglobin: 9.2 g/dL — ABNORMAL LOW (ref 12.0–15.0)
LYMPHS ABS: 0.5 10*3/uL — AB (ref 0.7–4.0)
Lymphocytes Relative: 4 %
MCH: 31.8 pg (ref 26.0–34.0)
MCHC: 32.7 g/dL (ref 30.0–36.0)
MCV: 97.2 fL (ref 78.0–100.0)
MONO ABS: 0.5 10*3/uL (ref 0.1–1.0)
Monocytes Relative: 4 %
Neutro Abs: 12.2 10*3/uL — ABNORMAL HIGH (ref 1.7–7.7)
Neutrophils Relative %: 92 %
PLATELETS: 137 10*3/uL — AB (ref 150–400)
RBC: 2.89 MIL/uL — AB (ref 3.87–5.11)
RDW: 17.9 % — AB (ref 11.5–15.5)
WBC: 13.2 10*3/uL — AB (ref 4.0–10.5)

## 2015-10-02 LAB — BASIC METABOLIC PANEL
ANION GAP: 8 (ref 5–15)
BUN: 26 mg/dL — ABNORMAL HIGH (ref 6–20)
CHLORIDE: 103 mmol/L (ref 101–111)
CO2: 28 mmol/L (ref 22–32)
Calcium: 8.1 mg/dL — ABNORMAL LOW (ref 8.9–10.3)
Creatinine, Ser: 0.63 mg/dL (ref 0.44–1.00)
Glucose, Bld: 105 mg/dL — ABNORMAL HIGH (ref 65–99)
POTASSIUM: 4.7 mmol/L (ref 3.5–5.1)
SODIUM: 139 mmol/L (ref 135–145)

## 2015-10-02 LAB — MAGNESIUM: MAGNESIUM: 1.6 mg/dL — AB (ref 1.7–2.4)

## 2015-10-02 LAB — PROTIME-INR
INR: 1.48 (ref 0.00–1.49)
PROTHROMBIN TIME: 17.4 s — AB (ref 11.6–15.2)

## 2015-10-02 MED ORDER — FENTANYL CITRATE (PF) 100 MCG/2ML IJ SOLN
INTRAMUSCULAR | Status: AC
  Filled 2015-10-02: qty 2

## 2015-10-02 MED ORDER — LIDOCAINE-EPINEPHRINE (PF) 2 %-1:200000 IJ SOLN
INTRAMUSCULAR | Status: AC | PRN
  Administered 2015-10-02: 10 mL via INTRADERMAL

## 2015-10-02 MED ORDER — LIDOCAINE-EPINEPHRINE (PF) 2 %-1:200000 IJ SOLN
INTRAMUSCULAR | Status: AC
  Filled 2015-10-02: qty 20

## 2015-10-02 MED ORDER — FENTANYL CITRATE (PF) 100 MCG/2ML IJ SOLN
INTRAMUSCULAR | Status: AC | PRN
  Administered 2015-10-02 (×3): 25 ug via INTRAVENOUS

## 2015-10-02 MED ORDER — WARFARIN - PHARMACIST DOSING INPATIENT: Freq: Every day | Status: DC

## 2015-10-02 MED ORDER — MAGNESIUM SULFATE 2 GM/50ML IV SOLN
2.0000 g | Freq: Once | INTRAVENOUS | Status: AC
  Administered 2015-10-02: 2 g via INTRAVENOUS
  Filled 2015-10-02: qty 50

## 2015-10-02 MED ORDER — MIDAZOLAM HCL 2 MG/2ML IJ SOLN
INTRAMUSCULAR | Status: AC
  Filled 2015-10-02: qty 4

## 2015-10-02 MED ORDER — WARFARIN SODIUM 2 MG PO TABS
2.0000 mg | ORAL_TABLET | Freq: Once | ORAL | Status: AC
  Administered 2015-10-02: 2 mg via ORAL
  Filled 2015-10-02: qty 1

## 2015-10-02 MED ORDER — MIDAZOLAM HCL 2 MG/2ML IJ SOLN
INTRAMUSCULAR | Status: AC | PRN
  Administered 2015-10-02: 0.5 mg via INTRAVENOUS
  Administered 2015-10-02: 1 mg via INTRAVENOUS
  Administered 2015-10-02: 0.5 mg via INTRAVENOUS

## 2015-10-02 MED ORDER — SONAFINE EX EMUL
1.0000 "application " | Freq: Once | CUTANEOUS | Status: AC
  Administered 2015-10-02: 1 via TOPICAL

## 2015-10-02 NOTE — Procedures (Signed)
Successful placement of right IJ approach port-a-cath with tip at the superior caval atrial junction. The catheter is ready for immediate use. Estimated Blood Loss: Minimal No immediate post procedural complications.  Jay Piers Baade, MD Pager #: [phone]   

## 2015-10-02 NOTE — Sedation Documentation (Signed)
Patient denies pain and is resting comfortably.  

## 2015-10-02 NOTE — Progress Notes (Signed)
Pt here for patient teaching.  Pt given Radiation and You booklet and Sonafine.  Reviewed areas of pertinence such as diarrhea, fatigue, hair loss, nausea and vomiting and skin changes . Pt able to give teach back of to pat skin, use unscented/gentle soap and have Imodium on hand,apply Sonafine bid and avoid applying anything to skin within 4 hours of treatment. Pt demonstrated understanding and verbalizes understanding of information given and will contact nursing with any questions or concerns.

## 2015-10-02 NOTE — Progress Notes (Signed)
[facility] Therapy Treatment Record Phone [phone]   Radiation Therapy was administered to [name] on: 10/02/2015  5:37 PM and was treatment # 5 out of a planned course of 5 treatments.  Radiation Treatment  1). Beam photons with 6-10 energy  2). Brachytherapy None  3). Stereotactic Radiosurgery None  4). Other Radiation None     Deanna Boehlke, [place], Rad Therap

## 2015-10-02 NOTE — Progress Notes (Signed)
SATURATION QUALIFICATIONS: (This note is used to comply with regulatory documentation for home oxygen)  Patient Saturations on Room Air at Rest = 92-94%  Patient Saturations on Room Air while Ambulating = 86 %  Patient Saturations on 2 Liters of oxygen while Ambulating = 97%  Please briefly explain why patient needs home oxygen: patient was able to tolerate ambulation. O2 sat decreased 86% after ambulating 10 ft. Oxygen was reapplied at 2 lt, saturation increased 97%

## 2015-10-02 NOTE — Progress Notes (Signed)
Spoke with pt and [name] at bedside in length concerning discharge plans. Pt agreed with going to SNF and asked for [facility]. CSW was made aware of this request.

## 2015-10-02 NOTE — Progress Notes (Signed)
Patient ID: [name], female   DOB: 25-Dec-1945, 70 y.o.   MRN: [id number]         [facility]    Date of Admission:  09/24/2015           Day 18 vancomycin  [name] remains afebrile. Repeat blood cultures were negative on 09/26/2015. She is currently out of her room for Port-A-Cath placement.I am planning on continuing vancomycin through 10/24/2015. I will arrange followup in my clinic.         Michel Bickers, MD [facility] [place] [facility] [phone] pager   [phone] cell 05/02/2015, 1:32 PM

## 2015-10-02 NOTE — Discharge Instructions (Signed)
Implanted Port Insertion, Care After °Refer to this sheet in the next few weeks. These instructions provide you with information on caring for yourself after your procedure. Your health care provider may also give you more specific instructions. Your treatment has been planned according to current medical practices, but problems sometimes occur. Call your health care provider if you have any problems or questions after your procedure. °WHAT TO EXPECT AFTER THE PROCEDURE °After your procedure, it is typical to have the following:  °· Discomfort at the port insertion site. Ice packs to the area will help. °· Bruising on the skin over the port. This will subside in 3-4 days. °HOME CARE INSTRUCTIONS °· After your port is placed, you will get a manufacturer's information card. The card has information about your port. Keep this card with you at all times.   °· Know what kind of port you have. There are many types of ports available.   °· Wear a medical alert bracelet in case of an emergency. This can help alert health care workers that you have a port.   °· The port can stay in for as long as your health care provider believes it is necessary.   °· A home health care nurse may give medicines and take care of the port.   °· You or a family member can get special training and directions for giving medicine and taking care of the port at home.   °SEEK MEDICAL CARE IF:  °· Your port does not flush or you are unable to get a blood return.   °· You have a fever or chills. °SEEK IMMEDIATE MEDICAL CARE IF: °· You have new fluid or pus coming from your incision.   °· You notice a bad smell coming from your incision site.   °· You have swelling, pain, or more redness at the incision or port site.   °· You have chest pain or shortness of breath. °  °This information is not intended to replace advice given to you by your health care provider. Make sure you discuss any questions you have with your health care provider. °  °Document  Released: 02/03/2013 Document Revised: 04/20/2013 Document Reviewed: 02/03/2013 °Elsevier Interactive Patient Education ©2016 [employer] ° °

## 2015-10-02 NOTE — Progress Notes (Signed)
PROGRESS NOTE    [name]  TXM:[id number] DOB: 09-Nov-1945 DOA: 09/24/2015 PCP: Curlene Labrum, MD    Brief Narrative: [name] is a 70 y.o. woman with a history of metastatic breast cancer, bone pain anticipating palliative radiation therapy, severe protein calorie malnutrition with adult failure to thrive, HTN, and GERD who was just discharged from this hospital on 09/22/2015 and sent to SNF for management of MRSA bacteremia. The plan was for her to complete a 4 week course of IV vancomycin from 09/19/2015. She left SNF AMA at some point yesterday, reporting that she was not receiving pain medications or her antibiotics as ordered. She was taken home by a friend (without access to supplemental oxygen). She became profoundly weak and was found down. She insists that she did no lose consciousness; she was just too weak to get herself up. She complains of chronic pain at this point. She had left sided pleuritic chest pain that improved with oxygen. She reports that she was only SOB when she was off of oxygen. Again, no LOC. No evidence of acute blood loss. No nausea or vomiting ("I don't know why they gave me Zofran at the other hospital."). 1-2 loose stools daily.  ED Course: The patient was evaluated in the ED at [facility]. Pertinent findings include new leukocytosis compared to discharge and evidence of acute kidney injury. No documented fever. Alk phos remains elevated, but LFTs have otherwise improved. Protein levels remain low. CT chest interpreted as multifocal pneumonia; I suspect this represents the same process that was identified here by chest CT on Sep 20, 2015.   Assessment & Plan:   Principal Problem:   MRSA bacteremia Active Problems:   GERD   Personal history of thrombophlebitis   Breast cancer metastasized to bone (HCC)   AKI (acute kidney injury) ([place])   Essential hypertension   Protein-calorie malnutrition, severe (HCC)   Pain from bone  metastases (HCC)   Generalized abdominal pain   Elevated LFTs   Rib pain on left side   Acute septic pulmonary embolism (HCC)   Acute respiratory failure with hypoxia (HCC)   Leukocytosis   Necrotizing pneumonia (HCC)   PICC line infection   Goals of care, counseling/discussion   Cavitary pneumonia  MRSA bacteremia with HCAP and probable septic embolic to the lung with new leukocytosis compared to discharge -- urine culture insignificant growth, blood cultures from 5/28 +MRSA 2/2, repeat blood culture from 5/30, no growth continue  -picc line placed from previously hospitalization removed on 5/30 -continue Vancomycin which was started from admission, she received levaquin, and zosyn initially on admission -TEE no vegetation on 5/25 from previous hospitalization, no need to repeat TEE, port placement early next week per ID recommendation, plan for iv abx till 6/27-appreciate ID input Coumadin held since 6/2 in preparation for port placement on 6/5, resumed at port placement.   Hypoxia:  arrange home o2.  History of metastatic breast cancer with bone pain: Recent PET scan on 08/31/15 showed numerous osseous mets-followed by Dr [name]. Recent MRI entire spine on 4/28 showed multiple osseous spinal mets with no cord compression. Plan for port placement and chemotherapy postponed with last admission due to acute illness.  Palliative radiation therapy anticipated for skull, left rib cage, and right tibial lesions. Discussed with Dr [name] patient will received localized radiation, ok to proceed to prevents complication meningion carcinomatosis.  pateint to received total of 5 days xrt, Last xrt on 6/5 per rad onc. Dr [name] plan  to start first chemo on 6/12  History of abnormal LFTs --Overall improved. No evidence of obvious liver mets on recent imaging. Continue to monitor.  Chronic pain --Fentanyl patch, MSIR prn (may need to change to dilaudid if renal failure  persists)  GERD --Continue PPI  AKI with relative hypotension --Will aggressively hydrate and monitor for now --If renal function does not improve, will need further work-up --Pharmacy to renally dose medications  Acute on Chronic anemia --Hgb drop to 6,8 0n 5/29. Received   2 units PRBC 5-30. Hb increase to 10/  Hold coumadin held on 5/29 and 5/30, hgb has been stable, coumadin resumed on 5/32.  Stool guaiac negative  Chronic anticoagulation for chronic, extensive superficial thrombophlebitis coumadin held on 5/29 and 5/30, due to drop of hemoglobin on 5/29.  hgb stabilized after prbc transfusion, no overt sign of bleed, coumadin resumed on 5/31 target on lower end of therapeutic range, monitor hgb, inr, Patient does has bilateral lower extremity edema, venous US no dvt, trial of lasix on 5/31 Coumadin held  Since 6/2 in ancitipating port placement on 6/5. Resumed after port placement.  History of hypertension but now showing relative hypotension --s/p IV fluids.  --Currently asymptomatic  Anorexia: Likely secondary to underlying malignancy worsened by pneumonia.   Protein-calorie malnutrition, severe: Continue supplements    DVT prophylaxis: coumadin  Code Status: DNR Family Communication: care discussed with patient  Disposition Plan: SNF on 6/6   Consultants:   ID  Oncology  Rad onc  IR   Procedures:   Removal of picc line on 5/30  Medi Port placement on 6/5  Antimicrobials:   Vancomycin    Subjective: Sitting in chair, not in distress,   Objective: Filed Vitals:   10/02/15 0930 10/02/15 0933 10/02/15 0956 10/02/15 1357  BP: 106/61 111/64 121/59 119/57  Pulse: 66 65 60 76  Temp:   98.1 F (36.7 C) 98.1 F (36.7 C)  TempSrc:   Oral Oral  Resp: 15 16 16 16   Height:      Weight:      SpO2: 98% 98% 100% 100%    Intake/Output Summary (Last 24 hours) at 10/02/15 1801 Last data filed at 10/02/15 1357  Gross per 24 hour  Intake    240 ml   Output      0 ml  Net    240 ml   Filed Weights   09/24/15 0605  Weight: 56.337 kg (124 lb 3.2 oz)    Examination:  General exam: Appears calm and comfortable  Respiratory system: Clear to auscultation. Respiratory effort normal. Cardiovascular system: S1 & S2 heard, RRR. No JVD, murmurs, rubs, gallops or clicks. Less  pedal edema. Gastrointestinal system: Abdomen is nondistended, soft and nontender. No organomegaly or masses felt. Normal bowel sounds heard. Central nervous system: Alert and oriented. No focal neurological deficits. Extremities: Symmetric 5 x 5 power.  Skin: No rashes, lesions or ulcers     Data Reviewed: I have personally reviewed following labs and imaging studies  CBC:  Recent Labs Lab 09/27/15 0432 09/28/15 0439 09/30/15 0425 10/02/15 0422 10/02/15 1356  WBC 9.7 9.8 12.0* 11.6* 13.2*  NEUTROABS  --   --   --   --  12.2*  HGB 10.0* 10.1* 10.0* 10.1* 9.2*  HCT 30.0* 30.4* 30.9* 31.1* 28.1*  MCV 96.8 97.4 99.4 98.7 97.2  PLT 181 197 199 192 194*   Basic Metabolic Panel:  Recent Labs Lab 09/26/15 0423 09/27/15 0432 09/28/15 0439 09/30/15 0425 10/02/15  0422  NA 138 139 139 141 139  K 4.3 3.5 4.2 4.2 4.7  CL 106 104 103 105 103  CO2 27 26 30 28 28   GLUCOSE 101* 114* 126* 120* 105*  BUN 21* 20 25* 24* 26*  CREATININE 0.54 0.62 0.71 0.64 0.63  CALCIUM 8.6* 8.1* 7.6* 8.1* 8.1*  MG  --   --  1.3* 1.5* 1.6*   GFR: Estimated Creatinine Clearance: 51 mL/min (by C-G formula based on Cr of 0.63). Liver Function Tests: No results for input(s): AST, ALT, ALKPHOS, BILITOT, PROT, ALBUMIN in the last 168 hours. No results for input(s): LIPASE, AMYLASE in the last 168 hours. No results for input(s): AMMONIA in the last 168 hours. Coagulation Profile:  Recent Labs Lab 09/28/15 0439 09/29/15 0420 09/30/15 0425 10/01/15 0435 10/02/15 0422  INR 1.76* 2.20* 2.23* 1.80* 1.48   Cardiac Enzymes: No results for input(s): CKTOTAL, CKMB, CKMBINDEX,  TROPONINI in the last 168 hours. BNP (last 3 results) No results for input(s): PROBNP in the last 8760 hours. HbA1C: No results for input(s): HGBA1C in the last 72 hours. CBG: No results for input(s): GLUCAP in the last 168 hours. Lipid Profile: No results for input(s): CHOL, HDL, LDLCALC, TRIG, CHOLHDL, LDLDIRECT in the last 72 hours. Thyroid Function Tests: No results for input(s): TSH, T4TOTAL, FREET4, T3FREE, THYROIDAB in the last 72 hours. Anemia Panel: No results for input(s): VITAMINB12, FOLATE, FERRITIN, TIBC, IRON, RETICCTPCT in the last 72 hours. Sepsis Labs: No results for input(s): PROCALCITON, LATICACIDVEN in the last 168 hours.  Recent Results (from the past 240 hour(s))  Culture, blood (single)     Status: Abnormal   Collection Time: 09/24/15  9:20 AM  Result Value Ref Range Status   Specimen Description BLOOD LEFT ARM  10 ML IN [facility] BOTTLE  Final   Special Requests NONE  Final   Culture  Setup Time   Final    GRAM POSITIVE COCCI IN CLUSTERS ANAEROBIC BOTTLE ONLY CRITICAL RESULT CALLED TO, READ BACK BY AND VERIFIED WITH: Damian Leavell PHARMD AT 1062 09/25/15 L. BENFIELD    Culture (A)  Final    STAPHYLOCOCCUS AUREUS SUSCEPTIBILITIES PERFORMED ON PREVIOUS CULTURE WITHIN THE LAST 5 DAYS. Performed at [facility]    Report Status 09/27/2015 FINAL  Final  Culture, blood (single)     Status: Abnormal   Collection Time: 09/24/15  9:45 AM  Result Value Ref Range Status   Specimen Description BLOOD PICC  10 ML IN [facility] BOTTLE  Final   Special Requests NONE  Final   Culture  Setup Time   Final    GRAM POSITIVE COCCI IN CLUSTERS ANAEROBIC BOTTLE ONLY CRITICAL RESULT CALLED TO, READ BACK BY AND VERIFIED WITH: Jerilynn Mages FRENS,PHARMD AT 6948 09/25/15 BY L BENFIELD Performed at [facility] (A)  Final   Report Status 09/27/2015 FINAL  Final   Organism ID, Bacteria METHICILLIN RESISTANT STAPHYLOCOCCUS AUREUS  Final       Susceptibility   Methicillin resistant staphylococcus aureus - MIC*    CIPROFLOXACIN >=8 RESISTANT Resistant     ERYTHROMYCIN >=8 RESISTANT Resistant     GENTAMICIN <=0.5 SENSITIVE Sensitive     OXACILLIN >=4 RESISTANT Resistant     TETRACYCLINE <=1 SENSITIVE Sensitive     VANCOMYCIN 1 SENSITIVE Sensitive     TRIMETH/SULFA <=10 SENSITIVE Sensitive     CLINDAMYCIN <=0.25 SENSITIVE Sensitive     RIFAMPIN <=0.5 SENSITIVE Sensitive  Inducible Clindamycin NEGATIVE Sensitive     * METHICILLIN RESISTANT STAPHYLOCOCCUS AUREUS  Blood Culture ID Panel (Reflexed)     Status: Abnormal   Collection Time: 09/24/15  9:45 AM  Result Value Ref Range Status   Enterococcus species NOT DETECTED NOT DETECTED Final   Vancomycin resistance NOT DETECTED NOT DETECTED Final   Listeria monocytogenes NOT DETECTED NOT DETECTED Final   Staphylococcus species DETECTED (A) NOT DETECTED Final    Comment: CRITICAL RESULT CALLED TO, READ BACK BY AND VERIFIED WITH: M FRENS,PHARMD AT 7209 09/25/15 BY L BENFIELD    Staphylococcus aureus DETECTED (A) NOT DETECTED Final    Comment: CRITICAL RESULT CALLED TO, READ BACK BY AND VERIFIED WITH: M FRENS,PHARMD AT 4709 09/25/15 BY L BENFIELD    Methicillin resistance DETECTED (A) NOT DETECTED Final    Comment: CRITICAL RESULT CALLED TO, READ BACK BY AND VERIFIED WITH: Edson Snowball AT 6283 09/25/15 BY L BENFIELD    Streptococcus species NOT DETECTED NOT DETECTED Final   Streptococcus agalactiae NOT DETECTED NOT DETECTED Final   Streptococcus pneumoniae NOT DETECTED NOT DETECTED Final   Streptococcus pyogenes NOT DETECTED NOT DETECTED Final   Acinetobacter baumannii NOT DETECTED NOT DETECTED Final   Enterobacteriaceae species NOT DETECTED NOT DETECTED Final   Enterobacter cloacae complex NOT DETECTED NOT DETECTED Final   Escherichia coli NOT DETECTED NOT DETECTED Final   Klebsiella oxytoca NOT DETECTED NOT DETECTED Final   Klebsiella pneumoniae NOT DETECTED NOT  DETECTED Final   Proteus species NOT DETECTED NOT DETECTED Final   Serratia marcescens NOT DETECTED NOT DETECTED Final   Carbapenem resistance NOT DETECTED NOT DETECTED Final   Haemophilus influenzae NOT DETECTED NOT DETECTED Final   Neisseria meningitidis NOT DETECTED NOT DETECTED Final   Pseudomonas aeruginosa NOT DETECTED NOT DETECTED Final   Candida albicans NOT DETECTED NOT DETECTED Final   Candida glabrata NOT DETECTED NOT DETECTED Final   Candida krusei NOT DETECTED NOT DETECTED Final   Candida parapsilosis NOT DETECTED NOT DETECTED Final   Candida tropicalis NOT DETECTED NOT DETECTED Final    Comment: Performed at [facility]  Culture, Urine     Status: Abnormal   Collection Time: 09/24/15  2:55 PM  Result Value Ref Range Status   Specimen Description URINE, RANDOM  Final   Special Requests NONE  Final   Culture (A)  Final    <10,000 COLONIES/mL INSIGNIFICANT GROWTH Performed at [facility]    Report Status 09/25/2015 FINAL  Final  Culture, blood (Routine X 2) w Reflex to ID Panel     Status: None   Collection Time: 09/26/15  1:11 PM  Result Value Ref Range Status   Specimen Description BLOOD RIGHT ARM  Final   Special Requests IN PEDIATRIC BOTTLE 0.5 CC  Final   Culture   Final    NO GROWTH 5 DAYS Performed at [facility]    Report Status 10/01/2015 FINAL  Final  Culture, blood (Routine X 2) w Reflex to ID Panel     Status: None   Collection Time: 09/26/15  1:11 PM  Result Value Ref Range Status   Specimen Description BLOOD LEFT HAND  Final   Special Requests IN PEDIATRIC BOTTLE 1 CC  Final   Culture   Final    NO GROWTH 5 DAYS Performed at [facility]    Report Status 10/01/2015 FINAL  Final  MRSA PCR Screening     Status: None   Collection Time: 09/28/15  7:31  AM  Result Value Ref Range Status   MRSA by PCR NEGATIVE NEGATIVE Final    Comment:        The GeneXpert MRSA Assay (FDA approved for NASAL specimens only), is one  component of a comprehensive MRSA colonization surveillance program. It is not intended to diagnose MRSA infection nor to guide or monitor treatment for MRSA infections.          Radiology Studies: Ir Fluoro Guide Cv Line Right  10/02/2015  INDICATION: History of recurrent metastatic breast cancer. In need of durable intravenous access for chemotherapy administration. EXAM: IMPLANTED PORT A CATH PLACEMENT WITH ULTRASOUND AND FLUOROSCOPIC GUIDANCE COMPARISON:  Chest CT - 09/20/2015 MEDICATIONS: Vancomycin 1 gm IV; The antibiotic was administered within an appropriate time interval prior to skin puncture. ANESTHESIA/SEDATION: Moderate (conscious) sedation was employed during this procedure. A total of Versed 2 mg and Fentanyl 75 mcg was administered intravenously. Moderate Sedation Time: 27 minutes. The patient's level of consciousness and vital signs were monitored continuously by radiology nursing throughout the procedure under my direct supervision. CONTRAST:  None FLUOROSCOPY TIME:  30 seconds (3 mGy) COMPLICATIONS: None immediate. PROCEDURE: The procedure, risks, benefits, and alternatives were explained to the patient. Questions regarding the procedure were encouraged and answered. The patient understands and consents to the procedure. The right neck and chest were prepped with chlorhexidine in a sterile fashion, and a sterile drape was applied covering the operative field. Maximum barrier sterile technique with sterile gowns and gloves were used for the procedure. A timeout was performed prior to the initiation of the procedure. Local anesthesia was provided with 1% lidocaine with epinephrine. After creating a small venotomy incision, a micropuncture kit was utilized to access the internal jugular vein under direct, real-time ultrasound guidance. Ultrasound image documentation was performed. The microwire was kinked to measure appropriate catheter length. A subcutaneous port pocket was then  created along the upper chest wall utilizing a combination of sharp and blunt dissection. The pocket was irrigated with sterile saline. A single lumen thin power injectable port was chosen for placement. The 8 Fr catheter was tunneled from the port pocket site to the venotomy incision. The port was placed in the pocket. The external catheter was trimmed to appropriate length. At the venotomy, an 8 Fr peel-away sheath was placed over a guidewire under fluoroscopic guidance. The catheter was then placed through the sheath and the sheath was removed. Final catheter positioning was confirmed and documented with a fluoroscopic spot radiograph. The port was accessed with a Huber needle, aspirated and flushed. The venotomy site was closed with an interrupted 4-0 Vicryl suture. The port pocket incision was closed with interrupted 2-0 Vicryl suture and the skin was opposed with a running subcuticular 4-0 Vicryl suture. Dermabond and Steri-strips were applied to both incisions. The Port a Catheter was left accessed as requested by the provided clinical team. Dressings were placed. The patient tolerated the procedure well without immediate post procedural complication. FINDINGS: After catheter placement, the tip lies within the superior cavoatrial junction. The catheter aspirates and flushes normally and is ready for immediate use. IMPRESSION: Successful placement of a right internal jugular approach power injectable Port-A-Cath. The catheter is ready for immediate use. Electronically Signed   By: Sandi Mariscal M.D.   On: 10/02/2015 09:55   Ir US Guide Vasc Access Right  10/02/2015  INDICATION: History of recurrent metastatic breast cancer. In need of durable intravenous access for chemotherapy administration. EXAM: IMPLANTED PORT A CATH PLACEMENT WITH ULTRASOUND  AND FLUOROSCOPIC GUIDANCE COMPARISON:  Chest CT - 09/20/2015 MEDICATIONS: Vancomycin 1 gm IV; The antibiotic was administered within an appropriate time interval  prior to skin puncture. ANESTHESIA/SEDATION: Moderate (conscious) sedation was employed during this procedure. A total of Versed 2 mg and Fentanyl 75 mcg was administered intravenously. Moderate Sedation Time: 27 minutes. The patient's level of consciousness and vital signs were monitored continuously by radiology nursing throughout the procedure under my direct supervision. CONTRAST:  None FLUOROSCOPY TIME:  30 seconds (3 mGy) COMPLICATIONS: None immediate. PROCEDURE: The procedure, risks, benefits, and alternatives were explained to the patient. Questions regarding the procedure were encouraged and answered. The patient understands and consents to the procedure. The right neck and chest were prepped with chlorhexidine in a sterile fashion, and a sterile drape was applied covering the operative field. Maximum barrier sterile technique with sterile gowns and gloves were used for the procedure. A timeout was performed prior to the initiation of the procedure. Local anesthesia was provided with 1% lidocaine with epinephrine. After creating a small venotomy incision, a micropuncture kit was utilized to access the internal jugular vein under direct, real-time ultrasound guidance. Ultrasound image documentation was performed. The microwire was kinked to measure appropriate catheter length. A subcutaneous port pocket was then created along the upper chest wall utilizing a combination of sharp and blunt dissection. The pocket was irrigated with sterile saline. A single lumen thin power injectable port was chosen for placement. The 8 Fr catheter was tunneled from the port pocket site to the venotomy incision. The port was placed in the pocket. The external catheter was trimmed to appropriate length. At the venotomy, an 8 Fr peel-away sheath was placed over a guidewire under fluoroscopic guidance. The catheter was then placed through the sheath and the sheath was removed. Final catheter positioning was confirmed and  documented with a fluoroscopic spot radiograph. The port was accessed with a Huber needle, aspirated and flushed. The venotomy site was closed with an interrupted 4-0 Vicryl suture. The port pocket incision was closed with interrupted 2-0 Vicryl suture and the skin was opposed with a running subcuticular 4-0 Vicryl suture. Dermabond and Steri-strips were applied to both incisions. The Port a Catheter was left accessed as requested by the provided clinical team. Dressings were placed. The patient tolerated the procedure well without immediate post procedural complication. FINDINGS: After catheter placement, the tip lies within the superior cavoatrial junction. The catheter aspirates and flushes normally and is ready for immediate use. IMPRESSION: Successful placement of a right internal jugular approach power injectable Port-A-Cath. The catheter is ready for immediate use. Electronically Signed   By: Sandi Mariscal M.D.   On: 10/02/2015 09:55        Scheduled Meds: . dexamethasone  2 mg Oral Q12H  . feeding supplement (PRO-STAT SUGAR FREE 64)  30 mL Oral TID  . fentaNYL  50 mcg Transdermal Q72H  . gabapentin  100 mg Oral QHS  . lidocaine  1 patch Transdermal Q24H  . magic mouthwash w/lidocaine  10 mL Oral TID  . pantoprazole  40 mg Oral Daily  . sodium chloride  1,000 mL Intravenous Once  . sodium chloride  1,000 mL Intravenous Once  . sodium chloride flush  3 mL Intravenous Q12H  . vancomycin  1,000 mg Intravenous Q24H  . warfarin  2 mg Oral ONCE-1800  . Warfarin - Pharmacist Dosing Inpatient   Does not apply q1800   Continuous Infusions:     LOS: 8 days  Time spent: 15 minutes.     Florencia Reasons, MD PhD [facility] Pager [phone]  If 7PM-7AM, please contact night-coverage [email or web address] Password [email or web address] 10/02/2015, 6:01 PM

## 2015-10-02 NOTE — Progress Notes (Signed)
PT Cancellation Note  Patient Details Name: [name] MRN: [id number] DOB: 1945/11/15   Cancelled Treatment:    Reason Eval/Treat Not Completed: Fatigue/lethargy limiting ability to participate Pt declines PT at this time stating she's too tired.  Pt stated maybe later (RN notified).   Elizabelle Fite,KATHrine E 10/02/2015, 1:33 PM Carmelia Bake, PT, DPT 10/02/2015 Pager: [phone]

## 2015-10-02 NOTE — Progress Notes (Signed)
Pharmacy Antibiotic Note  [name] is a 70 y.o. female re-admitted on 09/24/2015.  She was just discharged from this hospital on 09/22/2015 and sent to SNF for management of MRSA bacteremia. The plan was for her to complete a 4 week course of IV vancomycin from 09/19/2015. She left SNF AMA on 09/23/15, reporting that she was not receiving pain medications or her antibiotics as ordered. She was taken home by a friend where she became profoundly weak and was found down and subsequently brought to the ED at [facility] in [place], then transferred to [facility]. Pt initially started on Vancomycin, Levaquin, and Zosyn, then narrowed to Vancomycin alone per ID.    Today, 10/03/2015: Day #8 of 28 vancomycin (DOT from 5/30 with first negative BCx) SCr 0.86, slightly increased WBC 13.2, elevated and increased (dexamehtasone BID decreased to daily) Afebrile Vancomycin trough level: 14, slightly below goal, but SCr increased and expect further accumulation over time.  Plan: Continue Vancomycin 1g IV q24h. Measure Vanc trough at steady state. Follow up renal fxn, culture results, and clinical course.    Height: 5\' 2"  (157.5 cm) Weight: 124 lb 3.2 oz (56.337 kg) IBW/kg (Calculated) : 50.1  Temp (24hrs), Avg:98.2 F (36.8 C), Min:98.1 F (36.7 C), Max:98.5 F (36.9 C)   Recent Labs Lab 09/26/15 0423 09/27/15 0432 09/28/15 0439 09/28/15 0914 09/30/15 0425 10/02/15 0422  WBC 11.2* 9.7 9.8  --  12.0* 11.6*  CREATININE 0.54 0.62 0.71  --  0.64 0.63  VANCOTROUGH  --   --   --  13  --   --     Estimated Creatinine Clearance: 51 mL/min (by C-G formula based on Cr of 0.63).    Allergies  Allergen Reactions  . Aspirin Nausea And Vomiting  . Boost Pudding [Nutritional Supplements] Diarrhea  . Percodan [Oxycodone-Aspirin] Nausea Only  . Codeine Nausea Only    Antimicrobials this admission: 5/18 Cefepime >> 5/23 5/23 Ancef >> 5/23 5/18 Vancomycin >> 5/21, resume 5/23 >> 5/28 Zosyn >>  5/29 5/28 Levaquin >>5/29  Dose adjustments this admission: 5/25 1730 VT = 11 on 500 mg q12h - increase to 750mg  q12 6/1 0900 VT = 13 on 1gm IV q24h (prior to 5th dose) 6/6 0900 VT = 14 on 1g IV q24h  Microbiology results: BCx from [facility] growing MRSA 2/2 bottles, ID consulted 5/18 BCx x2: NGF 5/18 HIV antb: NR 5/18 ur strep pneu: neg 5/18 ur legionella: neg 5/25 BCx (repeat): NGF  Cultures this Admit: 5/28 BCx: MRSA (Vanco MIC = 1); BCID = MRSA 5/28 UCx: <10K insig growth. 5/30 BCx2: NGF 6/1 MRSA PCR: negative  Thank you for allowing pharmacy to be a part of this patient's care.  Gretta Arab PharmD, BCPS Pager [phone] 10/03/2015 10:13 AM

## 2015-10-02 NOTE — Progress Notes (Signed)
ANTICOAGULATION CONSULT NOTE - Follow Up Consult  Pharmacy Consult for Warfarin Indication: History extensive superficial vein thrombosis  Allergies  Allergen Reactions  . Aspirin Nausea And Vomiting  . Boost Pudding [Nutritional Supplements] Diarrhea  . Percodan [Oxycodone-Aspirin] Nausea Only  . Codeine Nausea Only    Patient Measurements: Height: 5\' 2"  (157.5 cm) Weight: 124 lb 3.2 oz (56.337 kg) IBW/kg (Calculated) : 50.1  Vital Signs: Temp: 98.1 F (36.7 C) (06/05 0956) Temp Source: Oral (06/05 0956) BP: 121/59 mmHg (06/05 0956) Pulse Rate: 60 (06/05 0956)  Labs:  Recent Labs  09/30/15 0425 10/01/15 0435 10/02/15 0422  HGB 10.0*  --  10.1*  HCT 30.9*  --  31.1*  PLT 199  --  192  LABPROT 23.8* 20.2* 17.4*  INR 2.23* 1.80* 1.48  CREATININE 0.64  --  0.63    Estimated Creatinine Clearance: 51 mL/min (by C-G formula based on Cr of 0.63).   Medications:  Scheduled:  . dexamethasone  2 mg Oral Q12H  . feeding supplement (PRO-STAT SUGAR FREE 64)  30 mL Oral TID  . fentaNYL  50 mcg Transdermal Q72H  . gabapentin  100 mg Oral QHS  . lidocaine  1 patch Transdermal Q24H  . magic mouthwash w/lidocaine  10 mL Oral TID  . pantoprazole  40 mg Oral Daily  . sodium chloride  1,000 mL Intravenous Once  . sodium chloride  1,000 mL Intravenous Once  . sodium chloride flush  3 mL Intravenous Q12H  . vancomycin  1,000 mg Intravenous Q24H   Infusions:    Assessment: 70 y.o. woman with a history of metastatic breast cancer, bone pain anticipating palliative radiation therapy, recently discharged 5/26 then readmitted 5/28 after leaving SNF AMA. She is on chronic warfarin therapy for hx extensive superficial vein thrombosis. INR is essentially therapeutic on admission (1.96) and Pharmacy is consulted to continue dosing while inpatient.  Dose prior to 5/18 admission reported as 2mg  daily except 4mg  Fri, Sat, Sun Recent dosing history in Epic reviewed: doses ranged from 1mg   to 4mg  to achieve therapeutic INR.  Today, 10/02/2015:  INR 1.48, subtherapeutic after holding for port placement; last dose given on 6/1  Port placement completed on [id number] without complications.  CBC: Hgb 10.1 and Plt 192 are stable  Diet: Regular, eating 75-100% of meals  No major drug-drug interactions noted  Goal of Therapy:  INR 2-3 Monitor platelets by anticoagulation protocol: Yes   Plan:  Warfarin 2 mg PO x 1 dose at 1800. Daily PT/INR. Monitor for signs and symptoms of bleeding.  Gretta Arab PharmD, BCPS Pager [phone] 10/02/2015 1:07 PM

## 2015-10-02 NOTE — Telephone Encounter (Signed)
s.w. pt and advised on June appt....pt ok and aware °

## 2015-10-03 ENCOUNTER — Ambulatory Visit: Payer: Medicare PPO

## 2015-10-03 ENCOUNTER — Other Ambulatory Visit: Payer: Self-pay | Admitting: Oncology

## 2015-10-03 DIAGNOSIS — C773 Secondary and unspecified malignant neoplasm of axilla and upper limb lymph nodes: Secondary | ICD-10-CM

## 2015-10-03 LAB — BASIC METABOLIC PANEL
Anion gap: 6 (ref 5–15)
BUN: 28 mg/dL — AB (ref 6–20)
CALCIUM: 8 mg/dL — AB (ref 8.9–10.3)
CHLORIDE: 101 mmol/L (ref 101–111)
CO2: 33 mmol/L — AB (ref 22–32)
CREATININE: 0.86 mg/dL (ref 0.44–1.00)
GFR calc non Af Amer: >60 mL/min (ref >60)
Glucose, Bld: 102 mg/dL — ABNORMAL HIGH (ref 65–99)
Potassium: 4.3 mmol/L (ref 3.5–5.1)
Sodium: 140 mmol/L (ref 135–145)

## 2015-10-03 LAB — MAGNESIUM: Magnesium: 1.7 mg/dL (ref 1.7–2.4)

## 2015-10-03 LAB — VANCOMYCIN, TROUGH: VANCOMYCIN TR: 14 ug/mL (ref 10.0–20.0)

## 2015-10-03 LAB — PROTIME-INR
INR: 1.36 (ref 0.00–1.49)
PROTHROMBIN TIME: 16.4 s — AB (ref 11.6–15.2)

## 2015-10-03 MED ORDER — DEXAMETHASONE 2 MG PO TABS
2.0000 mg | ORAL_TABLET | Freq: Every day | ORAL | Status: DC
  Administered 2015-10-03 – 2015-10-04 (×2): 2 mg via ORAL
  Filled 2015-10-03 (×2): qty 1

## 2015-10-03 MED ORDER — WARFARIN SODIUM 4 MG PO TABS
4.0000 mg | ORAL_TABLET | Freq: Once | ORAL | Status: AC
  Administered 2015-10-03: 4 mg via ORAL
  Filled 2015-10-03 (×2): qty 1

## 2015-10-03 MED ORDER — MAGNESIUM OXIDE 400 (241.3 MG) MG PO TABS
400.0000 mg | ORAL_TABLET | Freq: Every day | ORAL | Status: DC
  Administered 2015-10-03 – 2015-10-04 (×2): 400 mg via ORAL
  Filled 2015-10-03 (×2): qty 1

## 2015-10-03 MED ORDER — ONDANSETRON 4 MG PO TBDP
4.0000 mg | ORAL_TABLET | Freq: Four times a day (QID) | ORAL | Status: DC | PRN
  Administered 2015-10-03: 4 mg via ORAL
  Filled 2015-10-03: qty 1

## 2015-10-03 NOTE — Clinical Social Work Placement (Signed)
   CLINICAL SOCIAL WORK PLACEMENT  NOTE  Date:  10/03/2015  Patient Details  Name: [name] MRN: [id number] Date of Birth: 02/16/1946  Clinical Social Work is seeking post-discharge placement for this patient at the [place] level of care (*CSW will initial, date and re-position this form in  chart as items are completed):  Yes   Patient/family provided with [facility] Work Department's list of facilities offering this level of care within the geographic area requested by the patient (or if unable, by the patient's family).  Yes   Patient/family informed of their freedom to choose among providers that offer the needed level of care, that participate in Medicare, Medicaid or managed care program needed by the patient, have an available bed and are willing to accept the patient.  Yes   Patient/family informed of [facility]'s ownership interest in [facility] and [facility], as well as of the fact that they are under no obligation to receive care at these facilities.  PASRR submitted to EDS on 09/25/15     PASRR number received on 09/25/15     Existing PASRR number confirmed on       FL2 transmitted to all facilities in geographic area requested by pt/family on 09/25/15     FL2 transmitted to all facilities within larger geographic area on       Patient informed that his/her managed care company has contracts with or will negotiate with certain facilities, including the following:        Yes   Patient/family informed of bed offers received.  And again 10/03/2015   Patient chooses bed at       Physician recommends and patient chooses bed at      Patient to be transferred to   on  .  Patient to be transferred to facility by       Patient family notified on   of transfer.  Name of family member notified:        PHYSICIAN       Additional Comment:   Still pending insurance  authorization _______________________________________________ Lilly Cove, LCSW 10/03/2015, 2:45 PM

## 2015-10-03 NOTE — Progress Notes (Signed)
[name]   DOB:[birth year]   KT#:[id number]   DSK#:[id number]  Subjective: [name] did well with port placement yesterday-- no pain from that. Has some nausea, taking zofran. Some discomfort in lower R lung with deep breathing. Good BMlast night. No family in room   Objective: older white woman examined in bed  Filed Vitals:   10/02/15 2225 10/03/15 0603  BP: 104/56 112/57  Pulse: 68 73  Temp: 98.2 F (36.8 C) 98.5 F (36.9 C)  Resp: 16 16    Body mass index is 22.71 kg/(m^2).  Intake/Output Summary (Last 24 hours) at 10/03/15 0844 Last data filed at 10/02/15 1806  Gross per 24 hour  Intake    360 ml  Output      0 ml  Net    360 ml     Sclerae unicteric  Oropharynx clear  Lungs no rales or wheezes--auscultated anterolaterally  Heart regular rate and rhythm  Abdomen benign  Neuro nonfocal  Breast exam: deferred  CBG (last 3)  No results for input(s): GLUCAP in the last 72 hours.   Labs:  Lab Results  Component Value Date   WBC 13.2* 10/02/2015   HGB 9.2* 10/02/2015   HCT 28.1* 10/02/2015   MCV 97.2 10/02/2015   PLT 137* 10/02/2015   NEUTROABS 12.2* 10/02/2015    _0 @  Urine Studies No results for input(s): UHGB, CRYS in the last 72 hours.  Invalid input(s): UACOL, UAPR, USPG, UPH, UTP, UGL, UKET, UBIL, UNIT, UROB, [place], UEPI, UWBC, Junie Panning [place], [place], [place]  Basic Metabolic Panel:  Recent Labs Lab 09/27/15 0432 09/28/15 0439 09/30/15 0425 10/02/15 0422 10/03/15 0430  NA 139 139 141 139 140  K 3.5 4.2 4.2 4.7 4.3  CL 104 103 105 103 101  CO2 _1 33*  GLUCOSE 114* 126* 120* 105* 102*  BUN 20 25* 24* 26* 28*  CREATININE 0.62 0.71 0.64 0.63 0.86  CALCIUM 8.1* 7.6* 8.1* 8.1* 8.0*  MG  --  1.3* 1.5* 1.6* 1.7   GFR Estimated Creatinine Clearance: 47.5 mL/min (by C-G formula based on Cr of 0.86). Liver Function Tests: No results for input(s): AST, ALT, ALKPHOS, BILITOT, PROT, ALBUMIN in the last 168 hours. No results for  input(s): LIPASE, AMYLASE in the last 168 hours. No results for input(s): AMMONIA in the last 168 hours. Coagulation profile  Recent Labs Lab 09/29/15 0420 09/30/15 0425 10/01/15 0435 10/02/15 0422 10/03/15 0430  INR 2.20* 2.23* 1.80* 1.48 1.36    CBC:  Recent Labs Lab 09/27/15 0432 09/28/15 0439 09/30/15 0425 10/02/15 0422 10/02/15 1356  WBC 9.7 9.8 12.0* 11.6* 13.2*  NEUTROABS  --   --   --   --  12.2*  HGB 10.0* 10.1* 10.0* 10.1* 9.2*  HCT 30.0* 30.4* 30.9* 31.1* 28.1*  MCV 96.8 97.4 99.4 98.7 97.2  PLT 181 197 199 192 137*   Cardiac Enzymes: No results for input(s): CKTOTAL, CKMB, CKMBINDEX, TROPONINI in the last 168 hours. BNP: Invalid input(s): POCBNP CBG: No results for input(s): GLUCAP in the last 168 hours. D-Dimer No results for input(s): DDIMER in the last 72 hours. Hgb A1c No results for input(s): HGBA1C in the last 72 hours. Lipid Profile No results for input(s): CHOL, HDL, LDLCALC, TRIG, CHOLHDL, LDLDIRECT in the last 72 hours. Thyroid function studies No results for input(s): TSH, T4TOTAL, T3FREE, THYROIDAB in the last 72 hours.  Invalid input(s): FREET3 Anemia work up No results for input(s): VITAMINB12, FOLATE, FERRITIN, TIBC, IRON, RETICCTPCT in  the last 72 hours. Microbiology Recent Results (from the past 240 hour(s))  Culture, blood (single)     Status: Abnormal   Collection Time: 09/24/15  9:20 AM  Result Value Ref Range Status   Specimen Description BLOOD LEFT ARM  10 ML IN EACH BOTTLE  Final   Special Requests NONE  Final   Culture  Setup Time   Final    GRAM POSITIVE COCCI IN CLUSTERS ANAEROBIC BOTTLE ONLY CRITICAL RESULT CALLED TO, READ BACK BY AND VERIFIED WITH: M. FRIENDS PHARMD AT 0854 09/25/15 L. BENFIELD    Culture (A)  Final    STAPHYLOCOCCUS AUREUS SUSCEPTIBILITIES PERFORMED ON PREVIOUS CULTURE WITHIN THE LAST 5 DAYS. Performed at [facility]    Report Status 09/27/2015 FINAL  Final  Culture, blood (single)      Status: Abnormal   Collection Time: 09/24/15  9:45 AM  Result Value Ref Range Status   Specimen Description BLOOD PICC  10 ML IN EACH BOTTLE  Final   Special Requests NONE  Final   Culture  Setup Time   Final    GRAM POSITIVE COCCI IN CLUSTERS ANAEROBIC BOTTLE ONLY CRITICAL RESULT CALLED TO, READ BACK BY AND VERIFIED WITH: M FRENS,PHARMD AT 0854 09/25/15 BY L BENFIELD Performed at [facility]    Culture METHICILLIN RESISTANT STAPHYLOCOCCUS AUREUS (A)  Final   Report Status 09/27/2015 FINAL  Final   Organism ID, Bacteria METHICILLIN RESISTANT STAPHYLOCOCCUS AUREUS  Final      Susceptibility   Methicillin resistant staphylococcus aureus - MIC*    CIPROFLOXACIN >=8 RESISTANT Resistant     ERYTHROMYCIN >=8 RESISTANT Resistant     GENTAMICIN <=0.5 SENSITIVE Sensitive     OXACILLIN >=4 RESISTANT Resistant     TETRACYCLINE <=1 SENSITIVE Sensitive     VANCOMYCIN 1 SENSITIVE Sensitive     TRIMETH/SULFA <=10 SENSITIVE Sensitive     CLINDAMYCIN <=0.25 SENSITIVE Sensitive     RIFAMPIN <=0.5 SENSITIVE Sensitive     Inducible Clindamycin NEGATIVE Sensitive     * METHICILLIN RESISTANT STAPHYLOCOCCUS AUREUS  Blood Culture ID Panel (Reflexed)     Status: Abnormal   Collection Time: 09/24/15  9:45 AM  Result Value Ref Range Status   Enterococcus species NOT DETECTED NOT DETECTED Final   Vancomycin resistance NOT DETECTED NOT DETECTED Final   Listeria monocytogenes NOT DETECTED NOT DETECTED Final   Staphylococcus species DETECTED (A) NOT DETECTED Final    Comment: CRITICAL RESULT CALLED TO, READ BACK BY AND VERIFIED WITH: M FRENS,PHARMD AT 0854 09/25/15 BY L BENFIELD    Staphylococcus aureus DETECTED (A) NOT DETECTED Final    Comment: CRITICAL RESULT CALLED TO, READ BACK BY AND VERIFIED WITH: M FRENS,PHARMD AT 0854 09/25/15 BY L BENFIELD    Methicillin resistance DETECTED (A) NOT DETECTED Final    Comment: CRITICAL RESULT CALLED TO, READ BACK BY AND VERIFIED WITH: M FRENS,PHARMD AT 0854  09/25/15 BY L BENFIELD    Streptococcus species NOT DETECTED NOT DETECTED Final   Streptococcus agalactiae NOT DETECTED NOT DETECTED Final   Streptococcus pneumoniae NOT DETECTED NOT DETECTED Final   Streptococcus pyogenes NOT DETECTED NOT DETECTED Final   Acinetobacter baumannii NOT DETECTED NOT DETECTED Final   Enterobacteriaceae species NOT DETECTED NOT DETECTED Final   Enterobacter cloacae complex NOT DETECTED NOT DETECTED Final   Escherichia coli NOT DETECTED NOT DETECTED Final   Klebsiella oxytoca NOT DETECTED NOT DETECTED Final   Klebsiella pneumoniae NOT DETECTED NOT DETECTED Final   Proteus species NOT DETECTED   NOT DETECTED Final   Serratia marcescens NOT DETECTED NOT DETECTED Final   Carbapenem resistance NOT DETECTED NOT DETECTED Final   Haemophilus influenzae NOT DETECTED NOT DETECTED Final   Neisseria meningitidis NOT DETECTED NOT DETECTED Final   Pseudomonas aeruginosa NOT DETECTED NOT DETECTED Final   Candida albicans NOT DETECTED NOT DETECTED Final   Candida glabrata NOT DETECTED NOT DETECTED Final   Candida krusei NOT DETECTED NOT DETECTED Final   Candida parapsilosis NOT DETECTED NOT DETECTED Final   Candida tropicalis NOT DETECTED NOT DETECTED Final    Comment: Performed at [facility]  Culture, Urine     Status: Abnormal   Collection Time: 09/24/15  2:55 PM  Result Value Ref Range Status   Specimen Description URINE, RANDOM  Final   Special Requests NONE  Final   Culture (A)  Final    <10,000 COLONIES/mL INSIGNIFICANT GROWTH Performed at [facility]    Report Status 09/25/2015 FINAL  Final  Culture, blood (Routine X 2) w Reflex to ID Panel     Status: None   Collection Time: 09/26/15  1:11 PM  Result Value Ref Range Status   Specimen Description BLOOD RIGHT ARM  Final   Special Requests IN PEDIATRIC BOTTLE 0.5 CC  Final   Culture   Final    NO GROWTH 5 DAYS Performed at [facility]    Report Status 10/01/2015 FINAL  Final   Culture, blood (Routine X 2) w Reflex to ID Panel     Status: None   Collection Time: 09/26/15  1:11 PM  Result Value Ref Range Status   Specimen Description BLOOD LEFT HAND  Final   Special Requests IN PEDIATRIC BOTTLE 1 CC  Final   Culture   Final    NO GROWTH 5 DAYS Performed at [facility]    Report Status 10/01/2015 FINAL  Final  MRSA PCR Screening     Status: None   Collection Time: 09/28/15  7:31 AM  Result Value Ref Range Status   MRSA by PCR NEGATIVE NEGATIVE Final    Comment:        The GeneXpert MRSA Assay (FDA approved for NASAL specimens only), is one component of a comprehensive MRSA colonization surveillance program. It is not intended to diagnose MRSA infection nor to guide or monitor treatment for MRSA infections.       Studies:  Ir Fluoro Guide Cv Line Right  10/02/2015  INDICATION: History of recurrent metastatic breast cancer. In need of durable intravenous access for chemotherapy administration. EXAM: IMPLANTED PORT A CATH PLACEMENT WITH ULTRASOUND AND FLUOROSCOPIC GUIDANCE COMPARISON:  Chest CT - 09/20/2015 MEDICATIONS: Vancomycin 1 gm IV; The antibiotic was administered within an appropriate time interval prior to skin puncture. ANESTHESIA/SEDATION: Moderate (conscious) sedation was employed during this procedure. A total of Versed 2 mg and Fentanyl 75 mcg was administered intravenously. Moderate Sedation Time: 27 minutes. The patient's level of consciousness and vital signs were monitored continuously by radiology nursing throughout the procedure under my direct supervision. CONTRAST:  None FLUOROSCOPY TIME:  30 seconds (3 mGy) COMPLICATIONS: None immediate. PROCEDURE: The procedure, risks, benefits, and alternatives were explained to the patient. Questions regarding the procedure were encouraged and answered. The patient understands and consents to the procedure. The right neck and chest were prepped with chlorhexidine in a sterile fashion, and a  sterile drape was applied covering the operative field. Maximum barrier sterile technique with sterile gowns and gloves were used for the procedure. A   timeout was performed prior to the initiation of the procedure. Local anesthesia was provided with 1% lidocaine with epinephrine. After creating a small venotomy incision, a micropuncture kit was utilized to access the internal jugular vein under direct, real-time ultrasound guidance. Ultrasound image documentation was performed. The microwire was kinked to measure appropriate catheter length. A subcutaneous port pocket was then created along the upper chest wall utilizing a combination of sharp and blunt dissection. The pocket was irrigated with sterile saline. A single lumen thin power injectable port was chosen for placement. The 8 Fr catheter was tunneled from the port pocket site to the venotomy incision. The port was placed in the pocket. The external catheter was trimmed to appropriate length. At the venotomy, an 8 Fr peel-away sheath was placed over a guidewire under fluoroscopic guidance. The catheter was then placed through the sheath and the sheath was removed. Final catheter positioning was confirmed and documented with a fluoroscopic spot radiograph. The port was accessed with a Huber needle, aspirated and flushed. The venotomy site was closed with an interrupted 4-0 Vicryl suture. The port pocket incision was closed with interrupted 2-0 Vicryl suture and the skin was opposed with a running subcuticular 4-0 Vicryl suture. Dermabond and Steri-strips were applied to both incisions. The Port a Catheter was left accessed as requested by the provided clinical team. Dressings were placed. The patient tolerated the procedure well without immediate post procedural complication. FINDINGS: After catheter placement, the tip lies within the superior cavoatrial junction. The catheter aspirates and flushes normally and is ready for immediate use. IMPRESSION: Successful  placement of a right internal jugular approach power injectable Port-A-Cath. The catheter is ready for immediate use. Electronically Signed   By: John  Watts M.D.   On: 10/02/2015 09:55   Ir Us Guide Vasc Access Right  10/02/2015  INDICATION: History of recurrent metastatic breast cancer. In need of durable intravenous access for chemotherapy administration. EXAM: IMPLANTED PORT A CATH PLACEMENT WITH ULTRASOUND AND FLUOROSCOPIC GUIDANCE COMPARISON:  Chest CT - 09/20/2015 MEDICATIONS: Vancomycin 1 gm IV; The antibiotic was administered within an appropriate time interval prior to skin puncture. ANESTHESIA/SEDATION: Moderate (conscious) sedation was employed during this procedure. A total of Versed 2 mg and Fentanyl 75 mcg was administered intravenously. Moderate Sedation Time: 27 minutes. The patient's level of consciousness and vital signs were monitored continuously by radiology nursing throughout the procedure under my direct supervision. CONTRAST:  None FLUOROSCOPY TIME:  30 seconds (3 mGy) COMPLICATIONS: None immediate. PROCEDURE: The procedure, risks, benefits, and alternatives were explained to the patient. Questions regarding the procedure were encouraged and answered. The patient understands and consents to the procedure. The right neck and chest were prepped with chlorhexidine in a sterile fashion, and a sterile drape was applied covering the operative field. Maximum barrier sterile technique with sterile gowns and gloves were used for the procedure. A timeout was performed prior to the initiation of the procedure. Local anesthesia was provided with 1% lidocaine with epinephrine. After creating a small venotomy incision, a micropuncture kit was utilized to access the internal jugular vein under direct, real-time ultrasound guidance. Ultrasound image documentation was performed. The microwire was kinked to measure appropriate catheter length. A subcutaneous port pocket was then created along the upper  chest wall utilizing a combination of sharp and blunt dissection. The pocket was irrigated with sterile saline. A single lumen thin power injectable port was chosen for placement. The 8 Fr catheter was tunneled from the port pocket site to the venotomy   incision. The port was placed in the pocket. The external catheter was trimmed to appropriate length. At the venotomy, an 8 Fr peel-away sheath was placed over a guidewire under fluoroscopic guidance. The catheter was then placed through the sheath and the sheath was removed. Final catheter positioning was confirmed and documented with a fluoroscopic spot radiograph. The port was accessed with a Huber needle, aspirated and flushed. The venotomy site was closed with an interrupted 4-0 Vicryl suture. The port pocket incision was closed with interrupted 2-0 Vicryl suture and the skin was opposed with a running subcuticular 4-0 Vicryl suture. Dermabond and Steri-strips were applied to both incisions. The Port a Catheter was left accessed as requested by the provided clinical team. Dressings were placed. The patient tolerated the procedure well without immediate post procedural complication. FINDINGS: After catheter placement, the tip lies within the superior cavoatrial junction. The catheter aspirates and flushes normally and is ready for immediate use. IMPRESSION: Successful placement of a right internal jugular approach power injectable Port-A-Cath. The catheter is ready for immediate use. Electronically Signed   By: John  Watts M.D.   On: 10/02/2015 09:55    Assessment: 69 y.o. [name], [place] status post right lumpectomy and axillary lymph node dissection December 2006 for a TX N2, stage IIIA invasive ductal carcinoma, estrogen and progesterone receptor positive, with subsequent stage IV disease  (1) received adjuvant doxorubicin, docetaxel and cyclophosphamide  (2) status post adjuvant radiation  (3) on tamoxifen to September 2011 when she was noted to have  metastatic disease to bone  (4) fulvestrant 02/06/2009 to 01/15/2011  (5) left axillary lymph node positive for an invasive ductal carcinoma, estrogen receptor and HER-2 negative November 2011, and a right breast recurrence for an estrogen receptor positive lesion at the same time.  (6) status post bilateral mastectomies 06/28/2010  (7) status post left chest and axillary radiation  (8) status post right chest wall recurrence October of 2012, treated with electron beam radiation completed December 2012  (9) progression in bone August 2013,  (10) status post radiation to the lumbosacral spine completed October 2013   (11) capecitabine November 2013 through January 2014  (12) tamoxifen/ everolimus February 2014, discontinued November 2014 with progression  (13) Megace 80 mg twice daily with progression January 2015  (14) Radiation to lumbar spine February 2015  (15) letrozole plus Ibrance started 07/08/2013, discontinued May 2017 with progression  (16). Declines genetic testing (as of 08/17/2013)  (17) zolendronate 05/01/2009, discontinued because of pain; Xgeva started 10/08/2013, discontinued because of cost issues as of November 2015, switched to alendronate March 2016, switched back to Xgeva but stopped after the May 2016 dose because of side effects, on ibandronate [125 mg/ day 21 days on, 7 days off] temporarily, denosumab to be resumed 10/09/2015  (18) cyclophosphamide, methotrexate, and fluorouracil to be started 10/09/2015, repeated every 21 days.  (19) palliative radiation completed 10/02/2015    Plan:  [name] did well with port placement yesterday. She is very motivated to walk and is looking forward [facility]. She is hoping for d/c next 24 h if possible  We discussed possible doing her chemo today but she just finished radiation and I would have to hold her methotrexate-- better start 6/12 as scheduled. She will receive Xgeva the same  day.  Pain is well controlled. Have d/c'd IV analgesics so she can be on the same regimen at the SNF as here. BMs are adequate on her current bowel   prophylaxis. She normally has her coumadin checked though her PCP but she will be seeing us 6/12 and every 21 days so we can check it at those times in the [facility].  Please let me know if I can be of further help.  Greatly appreciate your help to this patient! MAGRINAT,GUSTAV C, MD 10/03/2015  8:44 AM Medical Oncology and Hematology [facility] [address] [place], [place] [address] Tel. [phone]    Fax. [phone] 

## 2015-10-03 NOTE — Progress Notes (Signed)
ANTICOAGULATION CONSULT NOTE - Follow Up Consult  Pharmacy Consult for Warfarin Indication: History extensive superficial vein thrombosis  Allergies  Allergen Reactions  . Aspirin Nausea And Vomiting  . Boost Pudding [Nutritional Supplements] Diarrhea  . Percodan [Oxycodone-Aspirin] Nausea Only  . Codeine Nausea Only    Patient Measurements: Height: 5\' 2"  (157.5 cm) Weight: 124 lb 3.2 oz (56.337 kg) IBW/kg (Calculated) : 50.1  Vital Signs: Temp: 98.5 F (36.9 C) (06/06 0603) Temp Source: Oral (06/06 0603) BP: 112/57 mmHg (06/06 0603) Pulse Rate: 73 (06/06 0603)  Labs:  Recent Labs  10/01/15 0435 10/02/15 0422 10/02/15 1356 10/03/15 0430  HGB  --  10.1* 9.2*  --   HCT  --  31.1* 28.1*  --   PLT  --  192 137*  --   LABPROT 20.2* 17.4*  --  16.4*  INR 1.80* 1.48  --  1.36  CREATININE  --  0.63  --  0.86    Estimated Creatinine Clearance: 47.5 mL/min (by C-G formula based on Cr of 0.86).   Medications:  Scheduled:  . dexamethasone  2 mg Oral Q12H  . feeding supplement (PRO-STAT SUGAR FREE 64)  30 mL Oral TID  . fentaNYL  50 mcg Transdermal Q72H  . gabapentin  100 mg Oral QHS  . lidocaine  1 patch Transdermal Q24H  . magic mouthwash w/lidocaine  10 mL Oral TID  . pantoprazole  40 mg Oral Daily  . sodium chloride  1,000 mL Intravenous Once  . sodium chloride  1,000 mL Intravenous Once  . sodium chloride flush  3 mL Intravenous Q12H  . vancomycin  1,000 mg Intravenous Q24H  . Warfarin - Pharmacist Dosing Inpatient   Does not apply q1800   Infusions:    Assessment: 70 y.o. woman with a history of metastatic breast cancer, bone pain anticipating palliative radiation therapy, recently discharged 5/26 then readmitted 5/28 after leaving SNF AMA. She is on chronic warfarin therapy for hx extensive superficial vein thrombosis. INR is essentially therapeutic on admission (1.96) and Pharmacy is consulted to continue dosing while inpatient.  Dose prior to 5/18  admission reported as 2mg  daily except 4mg  Fri, Sat, Sun Recent dosing history in Epic reviewed: doses ranged from 1mg  to 4mg  to achieve therapeutic INR.  Today, 10/03/2015:  INR 1.36, subtherapeutic and decreasing after holding since 6/1 for port placement, warfarin resumed 6/5  Port placement completed on [id number] without complications.  CBC: Hgb decreased to 9.2, Plt decreased to 137  Diet: Regular, eating 75-100% of meals  No major drug-drug interactions noted  Goal of Therapy:  INR 2-3 Monitor platelets by anticoagulation protocol: Yes   Plan:   Warfarin 4 mg PO x 1 dose at 1800.  Boosted dose today for decreasing INR, but had previously required lower daily doses inpatient.  Daily PT/INR.  Monitor for signs and symptoms of bleeding.  Gretta Arab PharmD, BCPS Pager [phone] 10/03/2015 8:36 AM

## 2015-10-03 NOTE — Progress Notes (Addendum)
Update: LCSW has met with patient and neice at length regarding disposition. Patient and niece report she must have a SNF due to no family being able to provide care in home with IV antibiotics and family [place] out of state. It has been decided by patient and niece that they are holding off on Chemotherapy to address infection and go to SNF. Patient is still lacking insurance authorization which can take up to 72 hours, but in some cases authorized sooner (case by case situation). LCSW explored option of paying privately until insurance authorization can be obtained.  Referrals have been sent out and LOG has been authorized for 5 days per Dr. Reynaldo Minium since ready for discharge Only option at this time is [facility] who has made a bed offer and accepts LOGs.   Patient does not want to go to [facility] due to being so far away. Family is wanting other facilities reviewed as well for placement in the [place] area Patient has been declined from previous search in [place] this admission to SNF due to leaving AMA from another facility. Patient also has a passar which expires on 10/19/15 due to her being a [place] resident. If patient admitted to [facility] facilities there will be no need for passar.  Referral sent to [facility] for review.  Call placed and message left for follow up. ? If they will take LOG.  LCSW will call [facility] and follow up with SNF along with three other facilities with regards to LOG and Human authorization.   Family is wanting to appeal the discharge. CM made aware and MDs have been contacted.   [facility] has been  Contacted and made aware of referral earlier in the day. They are on there way to see patient/review clinicals.  Due to chemo being stopped they are willing to look at accepting patient pending insurance authorization. Have discussed with facility need for LOG in which they will not accept, but talking to Administration to see if this can be  bypassed. Admissions is also working obtaining insurance authorization.  Will follow up and family notifed of referrals.    [facility]    LCSW made aware of patient wanting [facility] as disposition/discharge plan. Will need PT involvement and note reflecting skilled benefit for insurance authorization and referral as most recent PT note reflects patient would not complete as she was too tired and on 5/31 it states Home Health PT.   Also will need clarification regarding radiation treatment to disclose to SNF facility.   Will follow acutely and discuss options with patient once available.  Lane Hacker, MSW Clinical Social Work: [employer] [phone]

## 2015-10-03 NOTE — Progress Notes (Signed)
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

## 2015-10-03 NOTE — Progress Notes (Addendum)
PROGRESS NOTE    [name]  KXF:[id number] DOB: 02-15-1946 DOA: 09/24/2015 PCP: Curlene Labrum, MD    Brief Narrative: [name] is a 70 y.o. woman with a history of metastatic breast cancer, bone pain anticipating palliative radiation therapy, severe protein calorie malnutrition with adult failure to thrive, HTN, and GERD who was just discharged from this hospital on 09/22/2015 and sent to SNF for management of MRSA bacteremia. The plan was for her to complete a 4 week course of IV vancomycin from 09/19/2015. She left SNF AMA at some point yesterday, reporting that she was not receiving pain medications or her antibiotics as ordered. She was taken home by a friend (without access to supplemental oxygen). She became profoundly weak and was found down. She insists that she did no lose consciousness; she was just too weak to get herself up. She complains of chronic pain at this point. She had left sided pleuritic chest pain that improved with oxygen. She reports that she was only SOB when she was off of oxygen. Again, no LOC. No evidence of acute blood loss. No nausea or vomiting ("I don't know why they gave me Zofran at the other hospital."). 1-2 loose stools daily.  ED Course: The patient was evaluated in the ED at [facility]. Pertinent findings include new leukocytosis compared to discharge and evidence of acute kidney injury. No documented fever. Alk phos remains elevated, but LFTs have otherwise improved. Protein levels remain low. CT chest interpreted as multifocal pneumonia; I suspect this represents the same process that was identified here by chest CT on Sep 20, 2015.   Breast cancer with metastatic diseases, getting radiation . Bounce back.  She was Discharged to SNF with PICC. and left AMA, now back with MRSA bacteremia, HCAP, probable septic emboli to lung (multiple cavitary lesion on chest imaging)  . picc removed, received vanc and coumadin in the  hospital, repeat blood culture no growth, medi port placed on 6/5, she finished XRT therapy in the hospital , she will f/u with oncology on 6/12 to discuss chemo. She is to d/c to snf on 6/7 if bed available. Likely need home o2 to be arranged.    Assessment & Plan:   Principal Problem:   MRSA bacteremia Active Problems:   GERD   Personal history of thrombophlebitis   Breast cancer metastasized to bone (HCC)   AKI (acute kidney injury) ([place])   Essential hypertension   Protein-calorie malnutrition, severe (HCC)   Pain from bone metastases (HCC)   Generalized abdominal pain   Elevated LFTs   Rib pain on left side   Acute septic pulmonary embolism (HCC)   Acute respiratory failure with hypoxia (HCC)   Leukocytosis   Necrotizing pneumonia (HCC)   PICC line infection   Goals of care, counseling/discussion   Cavitary pneumonia  MRSA bacteremia with HCAP and probable septic embolic to the lung with new leukocytosis compared to discharge -- urine culture insignificant growth, blood cultures from 5/28 +MRSA 2/2, repeat blood culture from 5/30, no growth continue  -picc line placed from previously hospitalization removed on 5/30 -continue Vancomycin which was started from admission, she received levaquin, and zosyn initially on admission -TEE no vegetation on 5/25 from previous hospitalization, no need to repeat TEE, port placement early next week per ID recommendation, plan for iv abx till 6/27-appreciate ID input Coumadin held since 6/2 in preparation for port placement on 6/5, resumed after port placement.   Hypoxia:  Likely will need to arrange  home o2.  History of metastatic breast cancer with bone pain: Recent PET scan on 08/31/15 showed numerous osseous mets-followed by Dr [name]. Recent MRI entire spine on 4/28 showed multiple osseous spinal mets with no cord compression. Plan for port placement and chemotherapy postponed with last admission due to acute illness.  Palliative  radiation therapy anticipated for skull, left rib cage, and right tibial lesions. Discussed with Dr [name] patient will received localized radiation, ok to proceed to prevents complication meningion carcinomatosis.  pateint to received total of 5 days xrt, Last xrt on 6/5 per rad onc. Dr [name] plan to start first chemo on 6/12  History of abnormal LFTs --Overall improved. No evidence of obvious liver mets on recent imaging. Continue to monitor.  Chronic pain --Fentanyl patch, MSIR prn (may need to change to dilaudid if renal failure persists)  GERD --Continue PPI  AKI with relative hypotension --Will aggressively hydrate and monitor for now --If renal function does not improve, will need further work-up --Pharmacy to renally dose medications  Acute on Chronic anemia --Hgb drop to 6,8 0n 5/29. Received   2 units PRBC 5-30. Hb increase to 10/  Hold coumadin held on 5/29 and 5/30, hgb has been stable, coumadin resumed on 5/32.  Stool guaiac negative  Chronic anticoagulation for chronic, extensive superficial thrombophlebitis coumadin held on 5/29 and 5/30, due to drop of hemoglobin on 5/29.  hgb stabilized after prbc transfusion, no overt sign of bleed, coumadin resumed on 5/31 target on lower end of therapeutic range, monitor hgb, inr, Patient does has bilateral lower extremity edema, venous US no dvt, trial of lasix on 5/31 Coumadin held  Since 6/2 in ancitipating port placement on 6/5. Resumed after port placement.  History of hypertension but now showing relative hypotension --s/p IV fluids.  --Currently asymptomatic  Anorexia: Likely secondary to underlying malignancy worsened by pneumonia.   Protein-calorie malnutrition, severe: Continue supplements    DVT prophylaxis: coumadin  Code Status: DNR Family Communication: care discussed with patient  Disposition Plan: SNF on 6/7   Consultants:   ID  Oncology  Rad onc  IR   Procedures:   Removal of picc  line on 5/30  Medi Port placement on 6/5  Antimicrobials:   Vancomycin    Subjective: Sitting in chair, not in distress,   Objective: Filed Vitals:   10/02/15 1357 10/02/15 2225 10/03/15 0603 10/03/15 1435  BP: 119/57 104/56 112/57 100/58  Pulse: 76 68 73 75  Temp: 98.1 F (36.7 C) 98.2 F (36.8 C) 98.5 F (36.9 C) 98.5 F (36.9 C)  TempSrc: Oral Oral Oral Oral  Resp: 16 16 16 17   Height:      Weight:      SpO2: 100% 100% 99% 97%    Intake/Output Summary (Last 24 hours) at 10/03/15 1549 Last data filed at 10/03/15 1436  Gross per 24 hour  Intake    360 ml  Output      0 ml  Net    360 ml   Filed Weights   09/24/15 0605  Weight: 56.337 kg (124 lb 3.2 oz)    Examination:  General exam: Appears calm and comfortable  Respiratory system: Clear to auscultation. Respiratory effort normal. Cardiovascular system: S1 & S2 heard, RRR. No JVD, murmurs, rubs, gallops or clicks.   pedal edema has almost resolved Gastrointestinal system: Abdomen is nondistended, soft and nontender. No organomegaly or masses felt. Normal bowel sounds heard. Central nervous system: Alert and oriented. No focal neurological deficits. Extremities:  Symmetric 5 x 5 power.  Skin: No rashes, lesions or ulcers     Data Reviewed: I have personally reviewed following labs and imaging studies  CBC:  Recent Labs Lab 09/27/15 0432 09/28/15 0439 09/30/15 0425 10/02/15 0422 10/02/15 1356  WBC 9.7 9.8 12.0* 11.6* 13.2*  NEUTROABS  --   --   --   --  12.2*  HGB 10.0* 10.1* 10.0* 10.1* 9.2*  HCT 30.0* 30.4* 30.9* 31.1* 28.1*  MCV 96.8 97.4 99.4 98.7 97.2  PLT 181 197 199 192 517*   Basic Metabolic Panel:  Recent Labs Lab 09/27/15 0432 09/28/15 0439 09/30/15 0425 10/02/15 0422 10/03/15 0430  NA 139 139 141 139 140  K 3.5 4.2 4.2 4.7 4.3  CL 104 103 105 103 101  CO2 26 30 28 28  33*  GLUCOSE 114* 126* 120* 105* 102*  BUN 20 25* 24* 26* 28*  CREATININE 0.62 0.71 0.64 0.63 0.86    CALCIUM 8.1* 7.6* 8.1* 8.1* 8.0*  MG  --  1.3* 1.5* 1.6* 1.7   GFR: Estimated Creatinine Clearance: 47.5 mL/min (by C-G formula based on Cr of 0.86). Liver Function Tests: No results for input(s): AST, ALT, ALKPHOS, BILITOT, PROT, ALBUMIN in the last 168 hours. No results for input(s): LIPASE, AMYLASE in the last 168 hours. No results for input(s): AMMONIA in the last 168 hours. Coagulation Profile:  Recent Labs Lab 09/29/15 0420 09/30/15 0425 10/01/15 0435 10/02/15 0422 10/03/15 0430  INR 2.20* 2.23* 1.80* 1.48 1.36   Cardiac Enzymes: No results for input(s): CKTOTAL, CKMB, CKMBINDEX, TROPONINI in the last 168 hours. BNP (last 3 results) No results for input(s): PROBNP in the last 8760 hours. HbA1C: No results for input(s): HGBA1C in the last 72 hours. CBG: No results for input(s): GLUCAP in the last 168 hours. Lipid Profile: No results for input(s): CHOL, HDL, LDLCALC, TRIG, CHOLHDL, LDLDIRECT in the last 72 hours. Thyroid Function Tests: No results for input(s): TSH, T4TOTAL, FREET4, T3FREE, THYROIDAB in the last 72 hours. Anemia Panel: No results for input(s): VITAMINB12, FOLATE, FERRITIN, TIBC, IRON, RETICCTPCT in the last 72 hours. Sepsis Labs: No results for input(s): PROCALCITON, LATICACIDVEN in the last 168 hours.  Recent Results (from the past 240 hour(s))  Culture, blood (single)     Status: Abnormal   Collection Time: 09/24/15  9:20 AM  Result Value Ref Range Status   Specimen Description BLOOD LEFT ARM  10 ML IN [facility] BOTTLE  Final   Special Requests NONE  Final   Culture  Setup Time   Final    GRAM POSITIVE COCCI IN CLUSTERS ANAEROBIC BOTTLE ONLY CRITICAL RESULT CALLED TO, READ BACK BY AND VERIFIED WITH: Damian Leavell PHARMD AT 6160 09/25/15 L. BENFIELD    Culture (A)  Final    STAPHYLOCOCCUS AUREUS SUSCEPTIBILITIES PERFORMED ON PREVIOUS CULTURE WITHIN THE LAST 5 DAYS. Performed at [facility]    Report Status 09/27/2015 FINAL  Final  Culture,  blood (single)     Status: Abnormal   Collection Time: 09/24/15  9:45 AM  Result Value Ref Range Status   Specimen Description BLOOD PICC  10 ML IN [facility] BOTTLE  Final   Special Requests NONE  Final   Culture  Setup Time   Final    GRAM POSITIVE COCCI IN CLUSTERS ANAEROBIC BOTTLE ONLY CRITICAL RESULT CALLED TO, READ BACK BY AND VERIFIED WITH: Jerilynn Mages FRENS,PHARMD AT 7371 09/25/15 BY L BENFIELD Performed at [facility] (A)  Final   Report Status 09/27/2015 FINAL  Final   Organism ID, Bacteria METHICILLIN RESISTANT STAPHYLOCOCCUS AUREUS  Final      Susceptibility   Methicillin resistant staphylococcus aureus - MIC*    CIPROFLOXACIN >=8 RESISTANT Resistant     ERYTHROMYCIN >=8 RESISTANT Resistant     GENTAMICIN <=0.5 SENSITIVE Sensitive     OXACILLIN >=4 RESISTANT Resistant     TETRACYCLINE <=1 SENSITIVE Sensitive     VANCOMYCIN 1 SENSITIVE Sensitive     TRIMETH/SULFA <=10 SENSITIVE Sensitive     CLINDAMYCIN <=0.25 SENSITIVE Sensitive     RIFAMPIN <=0.5 SENSITIVE Sensitive     Inducible Clindamycin NEGATIVE Sensitive     * METHICILLIN RESISTANT STAPHYLOCOCCUS AUREUS  Blood Culture ID Panel (Reflexed)     Status: Abnormal   Collection Time: 09/24/15  9:45 AM  Result Value Ref Range Status   Enterococcus species NOT DETECTED NOT DETECTED Final   Vancomycin resistance NOT DETECTED NOT DETECTED Final   Listeria monocytogenes NOT DETECTED NOT DETECTED Final   Staphylococcus species DETECTED (A) NOT DETECTED Final    Comment: CRITICAL RESULT CALLED TO, READ BACK BY AND VERIFIED WITH: M FRENS,PHARMD AT 9379 09/25/15 BY L BENFIELD    Staphylococcus aureus DETECTED (A) NOT DETECTED Final    Comment: CRITICAL RESULT CALLED TO, READ BACK BY AND VERIFIED WITH: M FRENS,PHARMD AT 0240 09/25/15 BY L BENFIELD    Methicillin resistance DETECTED (A) NOT DETECTED Final    Comment: CRITICAL RESULT CALLED TO, READ BACK BY AND VERIFIED WITH: Edson Snowball AT 9735 09/25/15 BY L BENFIELD    Streptococcus species NOT DETECTED NOT DETECTED Final   Streptococcus agalactiae NOT DETECTED NOT DETECTED Final   Streptococcus pneumoniae NOT DETECTED NOT DETECTED Final   Streptococcus pyogenes NOT DETECTED NOT DETECTED Final   Acinetobacter baumannii NOT DETECTED NOT DETECTED Final   Enterobacteriaceae species NOT DETECTED NOT DETECTED Final   Enterobacter cloacae complex NOT DETECTED NOT DETECTED Final   Escherichia coli NOT DETECTED NOT DETECTED Final   Klebsiella oxytoca NOT DETECTED NOT DETECTED Final   Klebsiella pneumoniae NOT DETECTED NOT DETECTED Final   Proteus species NOT DETECTED NOT DETECTED Final   Serratia marcescens NOT DETECTED NOT DETECTED Final   Carbapenem resistance NOT DETECTED NOT DETECTED Final   Haemophilus influenzae NOT DETECTED NOT DETECTED Final   Neisseria meningitidis NOT DETECTED NOT DETECTED Final   Pseudomonas aeruginosa NOT DETECTED NOT DETECTED Final   Candida albicans NOT DETECTED NOT DETECTED Final   Candida glabrata NOT DETECTED NOT DETECTED Final   Candida krusei NOT DETECTED NOT DETECTED Final   Candida parapsilosis NOT DETECTED NOT DETECTED Final   Candida tropicalis NOT DETECTED NOT DETECTED Final    Comment: Performed at [facility]  Culture, Urine     Status: Abnormal   Collection Time: 09/24/15  2:55 PM  Result Value Ref Range Status   Specimen Description URINE, RANDOM  Final   Special Requests NONE  Final   Culture (A)  Final    <10,000 COLONIES/mL INSIGNIFICANT GROWTH Performed at [facility]    Report Status 09/25/2015 FINAL  Final  Culture, blood (Routine X 2) w Reflex to ID Panel     Status: None   Collection Time: 09/26/15  1:11 PM  Result Value Ref Range Status   Specimen Description BLOOD RIGHT ARM  Final   Special Requests IN PEDIATRIC BOTTLE 0.5 CC  Final   Culture   Final    NO GROWTH 5 DAYS  Performed at [facility]    Report Status 10/01/2015  FINAL  Final  Culture, blood (Routine X 2) w Reflex to ID Panel     Status: None   Collection Time: 09/26/15  1:11 PM  Result Value Ref Range Status   Specimen Description BLOOD LEFT HAND  Final   Special Requests IN PEDIATRIC BOTTLE 1 CC  Final   Culture   Final    NO GROWTH 5 DAYS Performed at [facility]    Report Status 10/01/2015 FINAL  Final  MRSA PCR Screening     Status: None   Collection Time: 09/28/15  7:31 AM  Result Value Ref Range Status   MRSA by PCR NEGATIVE NEGATIVE Final    Comment:        The GeneXpert MRSA Assay (FDA approved for NASAL specimens only), is one component of a comprehensive MRSA colonization surveillance program. It is not intended to diagnose MRSA infection nor to guide or monitor treatment for MRSA infections.          Radiology Studies: Ir Fluoro Guide Cv Line Right  10/02/2015  INDICATION: History of recurrent metastatic breast cancer. In need of durable intravenous access for chemotherapy administration. EXAM: IMPLANTED PORT A CATH PLACEMENT WITH ULTRASOUND AND FLUOROSCOPIC GUIDANCE COMPARISON:  Chest CT - 09/20/2015 MEDICATIONS: Vancomycin 1 gm IV; The antibiotic was administered within an appropriate time interval prior to skin puncture. ANESTHESIA/SEDATION: Moderate (conscious) sedation was employed during this procedure. A total of Versed 2 mg and Fentanyl 75 mcg was administered intravenously. Moderate Sedation Time: 27 minutes. The patient's level of consciousness and vital signs were monitored continuously by radiology nursing throughout the procedure under my direct supervision. CONTRAST:  None FLUOROSCOPY TIME:  30 seconds (3 mGy) COMPLICATIONS: None immediate. PROCEDURE: The procedure, risks, benefits, and alternatives were explained to the patient. Questions regarding the procedure were encouraged and answered. The patient understands and consents to the procedure. The right neck and chest were prepped with chlorhexidine in a  sterile fashion, and a sterile drape was applied covering the operative field. Maximum barrier sterile technique with sterile gowns and gloves were used for the procedure. A timeout was performed prior to the initiation of the procedure. Local anesthesia was provided with 1% lidocaine with epinephrine. After creating a small venotomy incision, a micropuncture kit was utilized to access the internal jugular vein under direct, real-time ultrasound guidance. Ultrasound image documentation was performed. The microwire was kinked to measure appropriate catheter length. A subcutaneous port pocket was then created along the upper chest wall utilizing a combination of sharp and blunt dissection. The pocket was irrigated with sterile saline. A single lumen thin power injectable port was chosen for placement. The 8 Fr catheter was tunneled from the port pocket site to the venotomy incision. The port was placed in the pocket. The external catheter was trimmed to appropriate length. At the venotomy, an 8 Fr peel-away sheath was placed over a guidewire under fluoroscopic guidance. The catheter was then placed through the sheath and the sheath was removed. Final catheter positioning was confirmed and documented with a fluoroscopic spot radiograph. The port was accessed with a Huber needle, aspirated and flushed. The venotomy site was closed with an interrupted 4-0 Vicryl suture. The port pocket incision was closed with interrupted 2-0 Vicryl suture and the skin was opposed with a running subcuticular 4-0 Vicryl suture. Dermabond and Steri-strips were applied to both incisions. The Port a Catheter was left accessed as requested by the  provided clinical team. Dressings were placed. The patient tolerated the procedure well without immediate post procedural complication. FINDINGS: After catheter placement, the tip lies within the superior cavoatrial junction. The catheter aspirates and flushes normally and is ready for immediate use.  IMPRESSION: Successful placement of a right internal jugular approach power injectable Port-A-Cath. The catheter is ready for immediate use. Electronically Signed   By: Sandi Mariscal M.D.   On: 10/02/2015 09:55   Ir US Guide Vasc Access Right  10/02/2015  INDICATION: History of recurrent metastatic breast cancer. In need of durable intravenous access for chemotherapy administration. EXAM: IMPLANTED PORT A CATH PLACEMENT WITH ULTRASOUND AND FLUOROSCOPIC GUIDANCE COMPARISON:  Chest CT - 09/20/2015 MEDICATIONS: Vancomycin 1 gm IV; The antibiotic was administered within an appropriate time interval prior to skin puncture. ANESTHESIA/SEDATION: Moderate (conscious) sedation was employed during this procedure. A total of Versed 2 mg and Fentanyl 75 mcg was administered intravenously. Moderate Sedation Time: 27 minutes. The patient's level of consciousness and vital signs were monitored continuously by radiology nursing throughout the procedure under my direct supervision. CONTRAST:  None FLUOROSCOPY TIME:  30 seconds (3 mGy) COMPLICATIONS: None immediate. PROCEDURE: The procedure, risks, benefits, and alternatives were explained to the patient. Questions regarding the procedure were encouraged and answered. The patient understands and consents to the procedure. The right neck and chest were prepped with chlorhexidine in a sterile fashion, and a sterile drape was applied covering the operative field. Maximum barrier sterile technique with sterile gowns and gloves were used for the procedure. A timeout was performed prior to the initiation of the procedure. Local anesthesia was provided with 1% lidocaine with epinephrine. After creating a small venotomy incision, a micropuncture kit was utilized to access the internal jugular vein under direct, real-time ultrasound guidance. Ultrasound image documentation was performed. The microwire was kinked to measure appropriate catheter length. A subcutaneous port pocket was then  created along the upper chest wall utilizing a combination of sharp and blunt dissection. The pocket was irrigated with sterile saline. A single lumen thin power injectable port was chosen for placement. The 8 Fr catheter was tunneled from the port pocket site to the venotomy incision. The port was placed in the pocket. The external catheter was trimmed to appropriate length. At the venotomy, an 8 Fr peel-away sheath was placed over a guidewire under fluoroscopic guidance. The catheter was then placed through the sheath and the sheath was removed. Final catheter positioning was confirmed and documented with a fluoroscopic spot radiograph. The port was accessed with a Huber needle, aspirated and flushed. The venotomy site was closed with an interrupted 4-0 Vicryl suture. The port pocket incision was closed with interrupted 2-0 Vicryl suture and the skin was opposed with a running subcuticular 4-0 Vicryl suture. Dermabond and Steri-strips were applied to both incisions. The Port a Catheter was left accessed as requested by the provided clinical team. Dressings were placed. The patient tolerated the procedure well without immediate post procedural complication. FINDINGS: After catheter placement, the tip lies within the superior cavoatrial junction. The catheter aspirates and flushes normally and is ready for immediate use. IMPRESSION: Successful placement of a right internal jugular approach power injectable Port-A-Cath. The catheter is ready for immediate use. Electronically Signed   By: Sandi Mariscal M.D.   On: 10/02/2015 09:55        Scheduled Meds: . dexamethasone  2 mg Oral Daily  . feeding supplement (PRO-STAT SUGAR FREE 64)  30 mL Oral TID  . fentaNYL  50 mcg Transdermal Q72H  . gabapentin  100 mg Oral QHS  . lidocaine  1 patch Transdermal Q24H  . magic mouthwash w/lidocaine  10 mL Oral TID  . pantoprazole  40 mg Oral Daily  . sodium chloride  1,000 mL Intravenous Once  . sodium chloride  1,000 mL  Intravenous Once  . sodium chloride flush  3 mL Intravenous Q12H  . vancomycin  1,000 mg Intravenous Q24H  . Warfarin - Pharmacist Dosing Inpatient   Does not apply q1800   Continuous Infusions:     LOS: 9 days    Time spent: 15 minutes.     Florencia Reasons, MD PhD [facility] Pager [phone]  If 7PM-7AM, please contact night-coverage [email or web address] Password [email or web address] 10/03/2015, 3:49 PM

## 2015-10-03 NOTE — Progress Notes (Signed)
Physical Therapy Treatment Patient Details Name: [name] MRN: [id number] DOB: Sep 13, 1945 Today's Date: 10/03/2015    History of Present Illness 70 yo female admitted with Pna from [facility] in [place], SIRS (recent adm with same--pt went to [facility] and left AMA). Hx of breast cancer with bony mets, FTT, HTN    PT Comments    Pt able to self rise OOB and amb to bathroom without AD all at Supervision level.  Amb a great distance in hallway at Supervision.  Good alternating gait.  No LOB.  Tolerated increased distance.    Follow Up Recommendations  Home health PT;Supervision - Intermittent (consulted with LPT pt's request for SNF however based on current  mobility level still rec HH ) unless qualify's for SNF under medical     Equipment Recommendations  Rolling walker with 5" wheels    Recommendations for Other Services       Precautions / Restrictions Precautions Precautions: Fall Restrictions Weight Bearing Restrictions: No    Mobility  Bed Mobility Overal bed mobility: Needs Assistance Bed Mobility: Supine to Sit     Supine to sit: Supervision     General bed mobility comments: assist for lines only.  able to pull own covers off and apply own socks while bending over sitting on EOB.   Transfers Overall transfer level: Needs assistance Equipment used: None;Rolling walker (2 wheeled) Transfers: Sit to/from Stand Sit to Stand: Supervision         General transfer comment: set up only and safety with IV pole.  Pt able to self rise including in bathroom as pt self performed asking therapist to "wait outside".   Ambulation/Gait Ambulation/Gait assistance: Supervision Ambulation Distance (Feet): 375 Feet Assistive device: Rolling walker (2 wheeled) Gait Pattern/deviations: Step-through pattern Gait velocity: WFL   General Gait Details: gait is steady.  did not use walker in room.  Used walker in hallway for increased support.  No LOB.   Tolerated increased distance.    Stairs            Wheelchair Mobility    Modified Rankin (Stroke Patients Only)       Balance                                    Cognition Arousal/Alertness: Awake/alert Behavior During Therapy: WFL for tasks assessed/performed Overall Cognitive Status: Within Functional Limits for tasks assessed                      Exercises      General Comments        Pertinent Vitals/Pain Pain Assessment: No/denies pain    Home Living                      Prior Function            PT Goals (current goals can now be found in the care plan section) Progress towards PT goals: Progressing toward goals    Frequency       PT Plan Current plan remains appropriate    Co-evaluation             End of Session Equipment Utilized During Treatment: Gait belt Activity Tolerance: Patient tolerated treatment well Patient left: in chair;with call bell/phone within reach     Time: [id number] PT Time Calculation (min) (ACUTE ONLY): 35 min  Charges:  $Gait  Training: 8-22 mins $Therapeutic Activity: 8-22 mins                    G Codes:      Rica Koyanagi  PTA [facility]  Acute  Rehab Pager      [phone]

## 2015-10-03 NOTE — Progress Notes (Signed)
  Radiation Oncology         [phone] ________________________________  Name: [name] MRN: [id number]  Date: 10/02/2015  DOB: [birth year]  Weekly Radiation Therapy Management - inpatient    ICD-9-CM ICD-10-CM   1. Breast cancer metastasized to bone, unspecified laterality (HCC) 174.9 C50.919    198.5 C79.51      Current Dose: 20 Gy     Planned Dose:  20 Gy  Narrative . . . . . . . . The patient presents for routine under treatment assessment.                                   The patient is Happy to complete her radiation therapy. She does notice improvement in her pain along the right tibia frontal skull region.                                 Set-up films were reviewed.                                 The chart was checked. Physical Findings. . .  Weight essentially stable.  No significant changes. Minimal erythema noted to the forehead area Impression . . . . . . . The patient is tolerating radiation. Plan . . . . . . . . . . . . Routine follow-up in one month. Patient may follow-up in [place] with me if she returns home.  ________________________________   Blair Promise, PhD, MD

## 2015-10-03 NOTE — Progress Notes (Signed)
Patient ID: [name], female   DOB: 1945/10/23, 70 y.o.   MRN: [id number]         [facility] for Infectious Disease    Date of Admission:  09/24/2015   Day 19 vancomycin  She had successful Port-A-Cath placement yesterday and is probably can be discharged to her skilled nursing facility tomorrow.  She is feeling better and coughing less. She is afebrile and follow-up blood cultures on 09/26/2015 were negative. I will plan on continuing vancomycin through 10/24/2015. She will follow-up with me in clinic on 10/18/2015. I will sign off now but please call if I can be of further assistance.         Michel Bickers, MD [facility] for Infectious [facility] [phone] pager   [phone] cell 05/02/2015, 1:32 PM

## 2015-10-03 NOTE — Progress Notes (Signed)
Patient requested the Zofran disintegrating tablet.  PCP was notified.

## 2015-10-04 ENCOUNTER — Other Ambulatory Visit: Payer: Self-pay

## 2015-10-04 ENCOUNTER — Ambulatory Visit: Payer: Medicare PPO

## 2015-10-04 DIAGNOSIS — N179 Acute kidney failure, unspecified: Secondary | ICD-10-CM

## 2015-10-04 DIAGNOSIS — T80219A Unspecified infection due to central venous catheter, initial encounter: Secondary | ICD-10-CM

## 2015-10-04 LAB — PROTIME-INR
INR: 1.57 — AB (ref 0.00–1.49)
Prothrombin Time: 18.8 seconds — ABNORMAL HIGH (ref 11.6–15.2)

## 2015-10-04 MED ORDER — GABAPENTIN 100 MG PO CAPS: 100.0000 mg | ORAL_CAPSULE | Freq: Every day | ORAL | Status: AC

## 2015-10-04 MED ORDER — MAGNESIUM OXIDE 400 (241.3 MG) MG PO TABS: 400.0000 mg | ORAL_TABLET | Freq: Every day | ORAL | Status: AC

## 2015-10-04 MED ORDER — FENTANYL 50 MCG/HR TD PT72: 50.0000 ug | MEDICATED_PATCH | TRANSDERMAL | Status: AC

## 2015-10-04 MED ORDER — HEPARIN SOD (PORK) LOCK FLUSH 100 UNIT/ML IV SOLN
500.0000 [IU] | INTRAVENOUS | Status: AC | PRN
  Administered 2015-10-04: 500 [IU]

## 2015-10-04 MED ORDER — MORPHINE SULFATE 15 MG PO TABS: 15.0000 mg | ORAL_TABLET | ORAL | Status: AC | PRN

## 2015-10-04 MED ORDER — VANCOMYCIN HCL IN DEXTROSE 1-5 GM/200ML-% IV SOLN: 1000.0000 mg | INTRAVENOUS | Status: DC

## 2015-10-04 MED ORDER — WARFARIN SODIUM 4 MG PO TABS
4.0000 mg | ORAL_TABLET | Freq: Once | ORAL | Status: AC
  Administered 2015-10-04: 4 mg via ORAL
  Filled 2015-10-04: qty 1

## 2015-10-04 MED ORDER — SODIUM CHLORIDE 0.9% FLUSH
10.0000 mL | INTRAVENOUS | Status: DC | PRN
  Administered 2015-10-04: 10 mL
  Filled 2015-10-04: qty 40

## 2015-10-04 NOTE — Progress Notes (Signed)
Report called to Arneta Cliche, LPN at [facility] and Rehab.  Per [employer] has ability to [employer]. EMS

## 2015-10-04 NOTE — Clinical Social Work Placement (Signed)
   CLINICAL SOCIAL WORK PLACEMENT  NOTE  Date:  10/04/2015  Patient Details  Name: [name] MRN: [id number] Date of Birth: 1946-03-04  Clinical Social Work is seeking post-discharge placement for this patient at the [place] level of care (*CSW will initial, date and re-position this form in  chart as items are completed):  Yes   Patient/family provided with [place] Work Department's list of facilities offering this level of care within the geographic area requested by the patient (or if unable, by the patient's family).  Yes   Patient/family informed of their freedom to choose among providers that offer the needed level of care, that participate in Medicare, Medicaid or managed care program needed by the patient, have an available bed and are willing to accept the patient.  Yes   Patient/family informed of [facility]'s ownership interest in [facility] and [facility], as well as of the fact that they are under no obligation to receive care at these facilities.  PASRR submitted to EDS on 09/25/15     PASRR number received on 09/25/15     Existing PASRR number confirmed on       FL2 transmitted to all facilities in geographic area requested by pt/family on 09/25/15     FL2 transmitted to all facilities within larger geographic area on       Patient informed that his/her managed care company has contracts with or will negotiate with certain facilities, including the following:        Yes   Patient/family informed of bed offers received.  Patient chooses bed at  [facility])     Physician recommends and patient chooses bed at      Patient to be transferred to  [facility]) on 10/04/15.  Patient to be transferred to facility by [facility]     Patient family notified on 10/04/15 of transfer.  Name of family member notified:  NIECE     PHYSICIAN       Additional Comment: Pt / niece are in agreement with d/c to [facility]  today. [employer] provided authorization for transport.Oxygen is required for transport. [facility] transport is required. Medical necessity form completed. D/C Summary sent to SNF for review. Scripts included in d/c packet. # for report provided to nsg.   _______________________________________________ Luretha Rued, [place] 10/04/2015, 3:06 PM

## 2015-10-04 NOTE — Progress Notes (Signed)
Patient discharged to [facility] and rehab in [place].  Patient transported vis [facility]

## 2015-10-04 NOTE — Progress Notes (Addendum)
ANTICOAGULATION CONSULT NOTE - Follow Up Consult  Pharmacy Consult for Warfarin Indication: History extensive superficial vein thrombosis  Allergies  Allergen Reactions  . Aspirin Nausea And Vomiting  . Boost Pudding [Nutritional Supplements] Diarrhea  . Percodan [Oxycodone-Aspirin] Nausea Only  . Codeine Nausea Only    Patient Measurements: Height: 5\' 2"  (157.5 cm) Weight: 124 lb 3.2 oz (56.337 kg) IBW/kg (Calculated) : 50.1  Vital Signs: Temp: 98.2 F (36.8 C) (06/07 0508) Temp Source: Oral (06/07 0508) BP: 96/48 mmHg (06/07 0508) Pulse Rate: 72 (06/07 0508)  Labs:  Recent Labs  10/02/15 0422 10/02/15 1356 10/03/15 0430 10/04/15 0345  HGB 10.1* 9.2*  --   --   HCT 31.1* 28.1*  --   --   PLT 192 137*  --   --   LABPROT 17.4*  --  16.4* 18.8*  INR 1.48  --  1.36 1.57*  CREATININE 0.63  --  0.86  --     Estimated Creatinine Clearance: 47.5 mL/min (by C-G formula based on Cr of 0.86).   Medications:  Scheduled:  . dexamethasone  2 mg Oral Daily  . feeding supplement (PRO-STAT SUGAR FREE 64)  30 mL Oral TID  . fentaNYL  50 mcg Transdermal Q72H  . gabapentin  100 mg Oral QHS  . lidocaine  1 patch Transdermal Q24H  . magic mouthwash w/lidocaine  10 mL Oral TID  . magnesium oxide  400 mg Oral Daily  . pantoprazole  40 mg Oral Daily  . sodium chloride  1,000 mL Intravenous Once  . sodium chloride  1,000 mL Intravenous Once  . sodium chloride flush  3 mL Intravenous Q12H  . vancomycin  1,000 mg Intravenous Q24H  . Warfarin - Pharmacist Dosing Inpatient   Does not apply q1800   Assessment: 70 y.o. woman with a history of metastatic breast cancer, bone pain anticipating palliative radiation therapy, recently discharged 5/26 then readmitted 5/28 after leaving SNF AMA. She is on chronic warfarin therapy for hx extensive superficial vein thrombosis. INR is essentially therapeutic on admission (1.96) and Pharmacy is consulted to continue dosing while inpatient.  Dose  prior to 5/18 admission reported as 2mg  daily except 4mg  Fri, Sat, Sun Recent dosing history in Epic reviewed: doses ranged from 1mg  to 4mg  to achieve therapeutic INR.  Today, 10/04/2015:  INR 1.57, subtherapeutic but increasing after holding since 6/1 for port placement, warfarin resumed 6/5  Port placement completed on [id number] without complications.  CBC: Hgb decreased to 9.2, Plt decreased to 137 (6/5)  Diet: Regular, eating 50-100% of meals  No major drug-drug interactions noted  Goal of Therapy:  INR 2-3 Monitor platelets by anticoagulation protocol: Yes   Plan:   Warfarin 4 mg PO x 1 dose at 1200 (prior to discharge).  Boosted dose today only for low INR.  Daily PT/INR.  CBC q72h  Monitor for signs and symptoms of bleeding.  For discharge, would recommend resuming PTA warfarin dose, 2mg  daily except 4mg  three days (move 4mg  days to Wed, Friday, Sun).  Gretta Arab PharmD, BCPS Pager [phone] 10/04/2015 8:57 AM

## 2015-10-04 NOTE — Progress Notes (Signed)
SATURATION QUALIFICATIONS: (This note is used to comply with regulatory documentation for home oxygen)  Patient Saturations on Room Air at Rest = 87%  Patient Saturations on Room Air while Ambulating = 81%  Patient Saturations on 2 Liters of oxygen while Ambulating = 95%  Please briefly explain why patient needs home oxygen: Patient unable to maintain adequate oxygenation without supplemental  oxygen when  ambulating

## 2015-10-04 NOTE — Care Management Important Message (Signed)
Important Message  Patient Details IM Letter given to Cookie/Case Manager to present to Patient Name: [name] MRN: [id number] Date of Birth: 11/01/1945   Medicare Important Message Given:  Yes    [name] 10/04/2015, 10:24 AMImportant Message  Patient Details  Name: [name] MRN: [id number] Date of Birth: [birth year]   Medicare Important Message Given:  Yes    [name] 10/04/2015, 10:24 AM

## 2015-10-04 NOTE — Discharge Summary (Addendum)
Physician Discharge Summary  [name] ENI:[id number] DOB: May 18, 1945 DOA: 09/24/2015  PCP: Curlene Labrum, MD  Admit date: 09/24/2015 Discharge date: 10/10/2015  Time spent: 35 minutes  Recommendations for Outpatient Follow-up:  1. Please follow up on PT/INR in 2 days, she is on Warfarin which was held for port placement. On 10/04/2015 she had an INR of 1.57.   2. Please administer vancomycin 1,000 mg IV every 24 hours through 10/24/2015. 3. She'll need hospital follow-up with Dr. [name] of medical oncology and Dr. Megan Salon of infectious disease 4. She will need continuous home oxygen via nasal cannula at 2 L 5. He was discharged to skilled nursing facility   Discharge Diagnoses:  Principal Problem:   MRSA bacteremia Active Problems:   GERD   Personal history of thrombophlebitis   Breast cancer metastasized to bone ([place])   AKI (acute kidney injury) ([place])   Essential hypertension   Protein-calorie malnutrition, severe ([place])   Pain from bone metastases ([facility])   Generalized abdominal pain   Elevated LFTs   Rib pain on left side   Acute septic pulmonary embolism ([facility])   Acute respiratory failure with hypoxia ([facility])   Leukocytosis   Necrotizing pneumonia ([place])   PICC line infection   Goals of care, counseling/discussion   Cavitary pneumonia   Anemia of chronic disease   Discharge Condition: Stable  Diet recommendation: Regular diet  Filed Weights   09/24/15 0605  Weight: 56.337 kg (124 lb 3.2 oz)    History of present illness:  [name] is a 70 y.o. woman with a history of metastatic breast cancer, bone pain anticipating palliative radiation therapy, severe protein calorie malnutrition with adult failure to thrive, HTN, and GERD who was just discharged from this hospital on 09/22/2015 and sent to SNF for management of MRSA bacteremia. The plan was for her to complete a 4 week course of IV vancomycin from 09/19/2015. She left SNF AMA at some point yesterday,  reporting that she was not receiving pain medications or her antibiotics as ordered. She was taken home by a friend (without access to supplemental oxygen). She became profoundly weak and was found down. She insists that she did no lose consciousness; she was just too weak to get herself up. She complains of chronic pain at this point. She had left sided pleuritic chest pain that improved with oxygen. She reports that she was only SOB when she was off of oxygen. Again, no LOC. No evidence of acute blood loss. No nausea or vomiting ("I don't know why they gave me Zofran at the other hospital."). 1-2 loose stools daily.  Hospital Course:   MRSA bacteremia with HCAP and probable septic embolic to the lung with new leukocytosis compared to discharge -- urine culture insignificant growth, blood cultures from 5/28 +MRSA 2/2, repeat blood culture from 5/30, no growth continue  -picc line placed from previously hospitalization removed on 5/30 -continue Vancomycin which was started from admission, she received levaquin, and zosyn initially on admission -TEE no vegetation on 5/25 from previous hospitalization, no need to repeat TEE, port placement early next week per ID recommendation, plan for iv abx till 6/27 Coumadin held since 6/2 in preparation for port placement on 6/5, resumed after port placement. -She will need follow-up with Dr. Megan Salon in the office in 1-2 weeks -She was discharged on vancomycin 1 g IV every 24 hours with end date 10/24/2015  Hypoxia: Likely will need to arrange home o2.  History of metastatic breast cancer with  bone pain:  -Recent PET scan on 08/31/15 showed numerous osseous mets-followed by Dr Darnelle Catalan. Recent MRI entire spine on 4/28 showed multiple osseous spinal mets with no cord compression. Plan for port placement and chemotherapy postponed with last admission due to acute illness.  -Palliative radiation therapy anticipated for skull, left rib cage, and right  tibial lesions. Discussed with Dr Darnelle Catalan patient will received localized radiation, ok to proceed to prevents complication meningion carcinomatosis.  pateint to received total of 5 days xrt, Last xrt on 6/5 per rad onc. -Dr Darnelle Catalan plan to start first chemo on 6/12  History of abnormal LFTs --Overall improved. No evidence of obvious liver mets on recent imaging. Continue to monitor.  Chronic pain -In setting of metastatic cancer -Fentanyl patch, MSIR prn   GERD --Continue PPI  AKI with relative hypotension -Resolved with IV fluid resuscitation. Lab work on 10/03/2015 showing creatinine of 0.86 with BUN of 28  Acute on Chronic anemia --Hgb drop to 6,8 0n 5/29. Received 2 units PRBC 5-30. Hb increase to 10/  Hold coumadin held on 5/29 and 5/30, hgb has been stable, coumadin resumed on 5/32.  Stool guaiac negative  Chronic anticoagulation for chronic, extensive superficial thrombophlebitis coumadin held on 5/29 and 5/30, due to drop of hemoglobin on 5/29.  hgb stabilized after prbc transfusion, no overt sign of bleed, coumadin resumed on 5/31 target on lower end of therapeutic range, monitor hgb, inr, Patient does has bilateral lower extremity edema, venous US no dvt, trial of lasix on 5/31 -Coumadin was held for port placement.  -Anticoagulation restarted, please follow-up on PT/INR in 2 days  Procedures:  Removal of picc line on 5/30  [facility] placement on 6/5  Consultations:  ID  Oncology  Rad onc  IR  Discharge Exam: Filed Vitals:   10/04/15 0508 10/04/15 1519  BP: 96/48 110/73  Pulse: 72 89  Temp: 98.2 F (36.8 C) 99.2 F (37.3 C)  Resp: 18 20    General exam: Appears calm and comfortable  Respiratory system: Clear to auscultation. Respiratory effort normal. Cardiovascular system: S1 & S2 heard, RRR. No JVD, murmurs, rubs, gallops or clicks. pedal edema has almost resolved Gastrointestinal system: Abdomen is nondistended, soft and  nontender. No organomegaly or masses felt. Normal bowel sounds heard. Central nervous system: Alert and oriented. No focal neurological deficits. Extremities: Symmetric 5 x 5 power.  Skin: No rashes, lesions or ulcers  Discharge Instructions   Discharge Instructions    Call MD for:  difficulty breathing, headache or visual disturbances    Complete by:  As directed      Call MD for:  extreme fatigue    Complete by:  As directed      Call MD for:  hives    Complete by:  As directed      Call MD for:  persistant dizziness or light-headedness    Complete by:  As directed      Call MD for:  persistant nausea and vomiting    Complete by:  As directed      Call MD for:  redness, tenderness, or signs of infection (pain, swelling, redness, odor or green/yellow discharge around incision site)    Complete by:  As directed      Call MD for:  severe uncontrolled pain    Complete by:  As directed      Call MD for:  temperature >100.4    Complete by:  As directed      Call MD  for:    Complete by:  As directed      Diet - low sodium heart healthy    Complete by:  As directed      For home use only DME oxygen    Complete by:  As directed   With activity  Mode or (Route):  Nasal cannula  Liters per Minute:  2  Oxygen delivery system:  Gas     Increase activity slowly    Complete by:  As directed           Discharge Medication List as of 10/04/2015  6:37 PM    START taking these medications   Details  gabapentin (NEURONTIN) 100 MG capsule Take 1 capsule (100 mg total) by mouth at bedtime., Starting 10/04/2015, Until Discontinued, Normal    magnesium oxide (MAG-OX) 400 (241.3 Mg) MG tablet Take 1 tablet (400 mg total) by mouth daily., Starting 10/04/2015, Until Discontinued, Normal    vancomycin (VANCOCIN) 1-5 GM/200ML-% SOLN Inject 200 mLs (1,000 mg total) into the vein daily., Starting 10/04/2015, Until Discontinued, Normal      CONTINUE these medications which have CHANGED   Details   fentaNYL (DURAGESIC - DOSED MCG/HR) 50 MCG/HR Place 1 patch (50 mcg total) onto the skin every 3 (three) days., Starting 10/04/2015, Until Discontinued, Print    morphine (MSIR) 15 MG tablet Take 1 tablet (15 mg total) by mouth every 4 (four) hours as needed for severe pain. Once every 12 hours as needed, Starting 10/04/2015, Until Discontinued, Print      CONTINUE these medications which have NOT CHANGED   Details  dexamethasone (DECADRON) 2 MG tablet Take 1 tablet (2 mg total) by mouth every 12 (twelve) hours., Starting 09/22/2015, Until Discontinued, No Print    feeding supplement (BOOST / RESOURCE BREEZE) LIQD Take 1 Container by mouth 2 (two) times daily between meals., Starting 09/22/2015, Until Discontinued, No Print    lidocaine (LIDODERM) 5 % Place 1 patch onto the skin daily. Remove & Discard patch within 12 hours or as directed by MD, Starting 09/22/2015, Until Discontinued, Print    magic mouthwash w/lidocaine SOLN Take 10 mLs by mouth 3 (three) times daily as needed for mouth pain., Starting 09/22/2015, Until Discontinued, No Print    ondansetron (ZOFRAN-ODT) 8 MG disintegrating tablet DISSOLVE 1 TABLET ON THE TONGUE EVERY 8 HOURS AS NEEDED FOR NAUSEA, No Print    pantoprazole (PROTONIX) 40 MG tablet Take 1 tablet (40 mg total) by mouth daily., Starting 09/22/2015, Until Discontinued, No Print    Polyethyl Glycol-Propyl Glycol (SYSTANE OP) Apply 1 drop to eye daily as needed (dry eyes)., Until Discontinued, Historical Med    warfarin (COUMADIN) 4 MG tablet Take 1 tablet (4 mg total) by mouth daily., Starting 02/28/2015, Until Discontinued, Normal      STOP taking these medications     polyethylene glycol (MIRALAX / GLYCOLAX) packet      Vancomycin (VANCOCIN) 750-5 MG/150ML-% SOLN      Wound Dressings (SONAFINE EX)        Allergies  Allergen Reactions  . Aspirin Nausea And Vomiting  . Boost Pudding [Nutritional Supplements] Diarrhea  . Percodan [Oxycodone-Aspirin] Nausea  Only  . Codeine Nausea Only   Follow-up Information    Follow up with Cliffton Asters, MD On 10/18/2015.   Specialty:  Infectious Diseases   Why:  mrsa bacteremia   Contact information:   [address] [place] [address] [phone]       Follow up  with [facility].   Specialty:  [place] information:   [address] [place] [phone]      Follow up with Curlene Labrum, MD In 2 weeks.   Specialty:  Family Medicine   Contact information:   [place] [address] [phone]       Follow up with Chauncey Cruel, MD In 2 weeks.   Specialty:  Oncology   Contact information:   [place] [address] [phone]        The results of significant diagnostics from this hospitalization (including imaging, microbiology, ancillary and laboratory) are listed below for reference.    Significant Diagnostic Studies: Dg Chest 1 View  09/24/2015  CLINICAL DATA:  Position of PICC. EXAM: CHEST 1 VIEW COMPARISON:  Radiograph of Sep 15, 2015.  CT scan of Sep 20, 2015. FINDINGS: The heart size and mediastinal contours are within normal limits. Interval placement of right-sided PICC line, with distal tip in expected position of cavoatrial junction. No pneumothorax or significant pleural effusion is noted. Right axillary surgical clips are noted. Cavitary lesion is seen in the left upper lobe concerning for pulmonary abscess. Another cavitary lesion is seen in the right upper lobe. The visualized skeletal structures are unremarkable. IMPRESSION: Interval placement of right-sided PICC line, with distal tip in expected position of cavoatrial junction. Bilateral cavitary lesions are noted most consistent with cavitary pneumonia. Electronically Signed   By: Marijo Conception, M.D.   On: 09/24/2015 09:12   Ct Chest Wo Contrast  09/20/2015  CLINICAL DATA:  Pneumonia. Left-sided chest  pain. Personal history of breast cancer with bone metastases. EXAM: CT CHEST WITHOUT CONTRAST TECHNIQUE: Multidetector CT imaging of the chest was performed following the standard protocol without IV contrast. COMPARISON:  Chest radiography 09/15/2015.  Chest CT 09/11/2015. FINDINGS: Again demonstrated is benign appearing pleural and parenchymal scarring at both lung apices. There is a background pattern of centrilobular emphysema particularly evident in the upper lobes. Patient has developed multiple widely disseminated cavitary infiltrates. There are 2 adjacent regions of cavitary infiltration in the left upper lobe responsible for the chest radiographic finding. The larger area measures 3.6 cm in diameter. The smaller area measures 2.8 cm in diameter. There is no pleural or pericardial fluid. No enlarged hilar or mediastinal lymph nodes are identified. Widespread bony metastatic changes are present as seen previously. Scans in the upper abdomen do not show any acute finding. A region of hazy infiltrate in the right lower lobe with 2 areas of cavitary infiltration is newly developed. The larger region measures 3.5 cm in diameter in the smaller region measures 1.4 cm in diameter. IMPRESSION: Development of multiple areas of cavitary infiltration in both the right and left lung, which have developed rapidly since a CT study of 9 days ago at which time day were not present at all. Lesions developing this rapidly must be inflammatory and could represent spontaneous pulmonary abscesses or could represent abscesses secondary to septic emboli. Certainly, atypical organisms must be considered including fungal disease. Electronically Signed   By: Nelson Chimes M.D.   On: 09/20/2015 20:41   Ir Fluoro Guide Cv Line Right  10/02/2015  INDICATION: History of recurrent metastatic breast cancer. In need of durable intravenous access for chemotherapy administration. EXAM: IMPLANTED PORT A CATH PLACEMENT WITH ULTRASOUND AND  FLUOROSCOPIC GUIDANCE COMPARISON:  Chest CT - 09/20/2015 MEDICATIONS: Vancomycin 1 gm IV; The antibiotic was administered within  an appropriate time interval prior to skin puncture. ANESTHESIA/SEDATION: Moderate (conscious) sedation was employed during this procedure. A total of Versed 2 mg and Fentanyl 75 mcg was administered intravenously. Moderate Sedation Time: 27 minutes. The patient's level of consciousness and vital signs were monitored continuously by radiology nursing throughout the procedure under my direct supervision. CONTRAST:  None FLUOROSCOPY TIME:  30 seconds (3 mGy) COMPLICATIONS: None immediate. PROCEDURE: The procedure, risks, benefits, and alternatives were explained to the patient. Questions regarding the procedure were encouraged and answered. The patient understands and consents to the procedure. The right neck and chest were prepped with chlorhexidine in a sterile fashion, and a sterile drape was applied covering the operative field. Maximum barrier sterile technique with sterile gowns and gloves were used for the procedure. A timeout was performed prior to the initiation of the procedure. Local anesthesia was provided with 1% lidocaine with epinephrine. After creating a small venotomy incision, a micropuncture kit was utilized to access the internal jugular vein under direct, real-time ultrasound guidance. Ultrasound image documentation was performed. The microwire was kinked to measure appropriate catheter length. A subcutaneous port pocket was then created along the upper chest wall utilizing a combination of sharp and blunt dissection. The pocket was irrigated with sterile saline. A single lumen thin power injectable port was chosen for placement. The 8 Fr catheter was tunneled from the port pocket site to the venotomy incision. The port was placed in the pocket. The external catheter was trimmed to appropriate length. At the venotomy, an 8 Fr peel-away sheath was placed over a guidewire  under fluoroscopic guidance. The catheter was then placed through the sheath and the sheath was removed. Final catheter positioning was confirmed and documented with a fluoroscopic spot radiograph. The port was accessed with a Huber needle, aspirated and flushed. The venotomy site was closed with an interrupted 4-0 Vicryl suture. The port pocket incision was closed with interrupted 2-0 Vicryl suture and the skin was opposed with a running subcuticular 4-0 Vicryl suture. Dermabond and Steri-strips were applied to both incisions. The Port a Catheter was left accessed as requested by the provided clinical team. Dressings were placed. The patient tolerated the procedure well without immediate post procedural complication. FINDINGS: After catheter placement, the tip lies within the superior cavoatrial junction. The catheter aspirates and flushes normally and is ready for immediate use. IMPRESSION: Successful placement of a right internal jugular approach power injectable Port-A-Cath. The catheter is ready for immediate use. Electronically Signed   By: Sandi Mariscal M.D.   On: 10/02/2015 09:55   Ir US Guide Vasc Access Right  10/02/2015  INDICATION: History of recurrent metastatic breast cancer. In need of durable intravenous access for chemotherapy administration. EXAM: IMPLANTED PORT A CATH PLACEMENT WITH ULTRASOUND AND FLUOROSCOPIC GUIDANCE COMPARISON:  Chest CT - 09/20/2015 MEDICATIONS: Vancomycin 1 gm IV; The antibiotic was administered within an appropriate time interval prior to skin puncture. ANESTHESIA/SEDATION: Moderate (conscious) sedation was employed during this procedure. A total of Versed 2 mg and Fentanyl 75 mcg was administered intravenously. Moderate Sedation Time: 27 minutes. The patient's level of consciousness and vital signs were monitored continuously by radiology nursing throughout the procedure under my direct supervision. CONTRAST:  None FLUOROSCOPY TIME:  30 seconds (3 mGy) COMPLICATIONS: None  immediate. PROCEDURE: The procedure, risks, benefits, and alternatives were explained to the patient. Questions regarding the procedure were encouraged and answered. The patient understands and consents to the procedure. The right neck and chest were prepped with chlorhexidine in  a sterile fashion, and a sterile drape was applied covering the operative field. Maximum barrier sterile technique with sterile gowns and gloves were used for the procedure. A timeout was performed prior to the initiation of the procedure. Local anesthesia was provided with 1% lidocaine with epinephrine. After creating a small venotomy incision, a micropuncture kit was utilized to access the internal jugular vein under direct, real-time ultrasound guidance. Ultrasound image documentation was performed. The microwire was kinked to measure appropriate catheter length. A subcutaneous port pocket was then created along the upper chest wall utilizing a combination of sharp and blunt dissection. The pocket was irrigated with sterile saline. A single lumen thin power injectable port was chosen for placement. The 8 Fr catheter was tunneled from the port pocket site to the venotomy incision. The port was placed in the pocket. The external catheter was trimmed to appropriate length. At the venotomy, an 8 Fr peel-away sheath was placed over a guidewire under fluoroscopic guidance. The catheter was then placed through the sheath and the sheath was removed. Final catheter positioning was confirmed and documented with a fluoroscopic spot radiograph. The port was accessed with a Huber needle, aspirated and flushed. The venotomy site was closed with an interrupted 4-0 Vicryl suture. The port pocket incision was closed with interrupted 2-0 Vicryl suture and the skin was opposed with a running subcuticular 4-0 Vicryl suture. Dermabond and Steri-strips were applied to both incisions. The Port a Catheter was left accessed as requested by the provided clinical  team. Dressings were placed. The patient tolerated the procedure well without immediate post procedural complication. FINDINGS: After catheter placement, the tip lies within the superior cavoatrial junction. The catheter aspirates and flushes normally and is ready for immediate use. IMPRESSION: Successful placement of a right internal jugular approach power injectable Port-A-Cath. The catheter is ready for immediate use. Electronically Signed   By: Sandi Mariscal M.D.   On: 10/02/2015 09:55   Dg Chest Port 1 View  09/15/2015  CLINICAL DATA:  Short breath, cough, fever EXAM: PORTABLE CHEST 1 VIEW COMPARISON:  08/31/2015 FINDINGS: Normal cardiac silhouette with ectatic aorta. Increased density in the LEFT upper lobe. The biapical densities are not changed from CT. Small LEFT effusion. No pneumothorax. IMPRESSION: 1. Increasing density in the LEFT upper lobe. Concerning for edema or infection. Electronically Signed   By: Suzy Bouchard M.D.   On: 09/15/2015 07:26   US Abdomen Limited Ruq  09/15/2015  CLINICAL DATA:  Elevated liver function tests. EXAM: US ABDOMEN LIMITED - RIGHT UPPER QUADRANT COMPARISON:  08/31/2015, PET-CT. FINDINGS: Gallbladder: Surgically absent Common bile duct: Diameter: Dilated to a maximum of 17 mm, unchanged from the recent prior PET-CT. No sonographic evidence of a duct stone. Liver: Cyst in the left liver lobe measuring 13 somewhat mottled appearance of the liver in its more posterior aspect, which could be technique. The possibility of numerous small infiltrating lesion should be considered in the patient's clinical setting. Liver is normal in size and overall echogenicity. IMPRESSION: 1. Chronically dilated common bile duct. Status post cholecystectomy. 2. Mottled appearance of the posterior liver. Consider followup liver MRI with and without contrast. Electronically Signed   By: Lajean Manes M.D.   On: 09/15/2015 10:04    Microbiology: No results found for this or any previous  visit (from the past 240 hour(s)).   Labs: Basic Metabolic Panel: No results for input(s): NA, K, CL, CO2, GLUCOSE, BUN, CREATININE, CALCIUM, MG, PHOS in the last 168 hours. Liver Function Tests:  No results for input(s): AST, ALT, ALKPHOS, BILITOT, PROT, ALBUMIN in the last 168 hours. No results for input(s): LIPASE, AMYLASE in the last 168 hours. No results for input(s): AMMONIA in the last 168 hours. CBC: No results for input(s): WBC, NEUTROABS, HGB, HCT, MCV, PLT in the last 168 hours. Cardiac Enzymes: No results for input(s): CKTOTAL, CKMB, CKMBINDEX, TROPONINI in the last 168 hours. BNP: BNP (last 3 results) No results for input(s): BNP in the last 8760 hours.  ProBNP (last 3 results) No results for input(s): PROBNP in the last 8760 hours.  CBG: No results for input(s): GLUCAP in the last 168 hours.     Signed:  Kelvin Cellar MD.  [facility] 10/10/2015, 4:38 PM

## 2015-10-05 ENCOUNTER — Ambulatory Visit: Payer: Medicare PPO

## 2015-10-05 ENCOUNTER — Other Ambulatory Visit: Payer: Self-pay | Admitting: Oncology

## 2015-10-06 ENCOUNTER — Ambulatory Visit: Payer: Medicare PPO

## 2015-10-09 ENCOUNTER — Ambulatory Visit: Payer: Medicare PPO

## 2015-10-09 ENCOUNTER — Other Ambulatory Visit: Payer: Medicare PPO

## 2015-10-09 ENCOUNTER — Encounter: Payer: Medicare PPO | Admitting: Oncology

## 2015-10-09 ENCOUNTER — Telehealth: Payer: Self-pay | Admitting: *Deleted

## 2015-10-09 MED ORDER — DEXAMETHASONE 4 MG PO TABS: 8.0000 mg | ORAL_TABLET | Freq: Every day | ORAL | Status: DC

## 2015-10-09 MED ORDER — LIDOCAINE-PRILOCAINE 2.5-2.5 % EX CREA: TOPICAL_CREAM | CUTANEOUS | Status: AC

## 2015-10-09 MED ORDER — PROCHLORPERAZINE MALEATE 10 MG PO TABS: 10.0000 mg | ORAL_TABLET | Freq: Four times a day (QID) | ORAL | Status: AC | PRN

## 2015-10-09 NOTE — Telephone Encounter (Signed)
Per desk RN I have moved appt form today to Wednesday. Desk RN to call the patient

## 2015-10-09 NOTE — Progress Notes (Signed)
[facility]  [phone] [phone]     ID: [name] OB: 11-18-1945  MR#: [id number]  GNF#:[id number]  PCP: Curlene Labrum, MD GYN:  Gari Crown  SU:  OTHER MD: Gery Pray, Sherre Poot  CHIEF COMPLAINT: stage IV breast cancer  CURRENT TREATMENT: letrozole + palbocilcib [name]; denosumab  BREAST CANCER HISTORY: From Dr. Adria Dill summary 07/14/2013::  "Complex 70 year old woman with history of ER-positive and ER negative metachronous primary bilateral breast cancers.  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 Progression in bone while on tamoxifen September 2011. Initial complete response to monthly Faslodex injections.She developed a malignant left axillary lymph node with no clear primary in the left breast November 2011. This tumor was ER negative. HER-2 negative. While under evaluation for this lesion she developed a new lesion in the right breast which was ER positive. She underwent bilateral mastectomies 06/28/2010. She was continued on hormonal therapy with monthly Faslodex injections in view of the ER positive component of her tumor with a complete response to this hormone in bone. She underwent radiation to the left chest and axilla subsequent to the mastectomies. She developed a cutaneous recurrence on the right chest wall along the medial aspect of the right mastectomy scar, biopsy proven, initially detected on clinical exam by her radiation oncologist on 02/14/2011. She was treated with electron beam radiation.. 3000 cGy in 15 fractions through December 2012. Further progression in bone in August 2013. Palliative radiation to lumbosacral spine September through October 2013. Brief trial of Xeloda November 2013 through January 2014.  Change to tamoxifen plus [employer] February 2014 stopped for progression and 25 pound weight loss November 2014. Trial of Megace 80 mg twice a day with improved appetite but progressive rise in tumor marker and progressive low back pain with further progression on MRI of the spine done January 2015. Radiation to the lumbar spine February 2015. She started most recent salvage regimen with a combination of Ibrance plus Femara on 07/08/2013. It is too early to assess response."  The patient's breast cancer history is summarized in detailed below   INTERVAL HISTORY: [name] returns today for follow-up of her metastatic estrogen receptor positive breast cancer, accompanied by her friend (whom she calls her "mom"). Sherryhas been having more pain problems and on 08/25/2015 she was restaged with MRIs of the cervical, thoracic and lumbar spines as well as a brain MRI with and without contrast. The brain MRI showed no central nervous system involvement. However there were many skull metastases which appear to have progressed as compared to 2010 (7 years ago). MRI of the spine showed progression in the cervical spine as compared to 2015 with now complete marrow replacement of C4 but no epidural tumor or spinal cord compression. There is also significant degenerative disease without spinal stenosis.  In the thoracic spine there has been some progression as compared to March 2017. There was complete involvement of T10 and progressive marrow replacement of T1-T2. There is also further loss of height of the T12 superior endplate compression fracture previously noted. There was ventral epidural enhancement from T5-T8 not changed significantly since the prior scan. There was no evidence of spinal cord compression. Again significant degenerative disease was noted. MRI of the lumbar spine was basically stable.  [name] has tolerated the letrozole and palbociclib well, but the cancer is progressing through  these agents and were  going to have to make a change.Marland Kitchen  REVIEW OF SYSTEMS: Her pain is moderately well controlled on hydrocodone, which she takes 4 or 5 times a day, and morphine 15 mg tablets of which she takes one half at night. She does not take oxycodone. She has stopped taking the Flexeril. She is mildly constipated but handles that "okay" with stool softeners. She continues to work full-time. She worries about a tremor in particularly worse at night affect her work. Her fingertips and nails are "worn out". She has a sore throat. She has intermittent fevers. She feels forgetful and anxious but denies depression. She continues on Coumadin, with no evidence of bleeding or unusual bruising A detailed review of systems today was otherwise stable.  PAST MEDICAL HISTORY: Past Medical History  Diagnosis Date  . Cancer (HCC)     RBreast, Lower back , Under l Arm  . Hypertension   . GERD (gastroesophageal reflux disease)   . Breast cancer metastasized to bone ([place]) 04/30/2011  . Radiation 01/15/2012    Lumbosacral spine 3500 cGy 14  fx  . Swollen R ankle 08/10/2012  . Fracture of ankle, medial malleolus, right, closed 08/10/2012  . Tubular adenoma 2011  . Hiatal hernia 2011  . Schatzki's ring   . Anorexia 03/02/2013  . Weight loss due to medication 03/02/2013  . C. difficile colitis   . Pneumonia   . 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 Gallbladder surgery  1991  . Mastectomy  06/28/10    bilateral- Dr. Rosebud Poles, [place]  . Inguinal hernia repair  90's    right  . Parathyroidectomy  90's  . Carpal tunnel release  90's  . Oophorectomy  90's    right  . Lymph node dissection    . Breast surgery    . Cholecystectomy    . Colonoscopy  01/08/10    Dr. [name]- anal tag,hemorrhoid o/w normal rectum, pancolonic diverticula, diminutive polyp in the base of the cecum= tubular adenoma on bx. poor prep  . Esophagogastroduodenoscopy  01/08/10     Dr. [name]- normal esophagus, small hiatal hernia, antum and body erosions, pedunculated polyp between D1 and D2, duodenal diverticulum, lymphangiectasia, second portion of the duodenum.stomach bx= inflammation, duodenum bx= adenoma  . Rotator cuff repair  08/27/11    Dr. Alphonzo Cruise- [facility]- [place]  . Esophagogastroduodenoscopy (egd) with propofol N/A 11/19/2012    RMR: non-critical Schatzki's ring s/p dilation with 5 F, multiple gastric polyps, duodenal polyp s/p piecemeal snare polypectomy, gastric polypectomy, path benign  . Maloney dilation N/A 11/19/2012    Procedure: Venia Minks DILATION;  Surgeon: Daneil Dolin, MD;  Location: [facility];  Service: Endoscopy;  Laterality: N/A;  #[id number]  . Polypectomy N/A 11/19/2012    Procedure: POLYPECTOMY;  Surgeon: Daneil Dolin, MD;  Location: [facility];  Service: Endoscopy;  Laterality: N/A;  duodenal  . Biopsy N/A 11/19/2012    Procedure: BIOPSY;  Surgeon: Daneil Dolin, MD;  Location: [facility];  Service: Endoscopy;  Laterality: N/A;  esophageal  . Tee without cardioversion N/A 09/21/2015    Procedure: TRANSESOPHAGEAL ECHOCARDIOGRAM (TEE);  Surgeon: Sanda Klein, MD;  Location: [facility] ENDOSCOPY;  Service: Cardiovascular;  Laterality: N/A;    FAMILY HISTORY Family History  Problem Relation Age of Onset  . Cancer Mother     Colon  . Cancer Father     Lung, Mouth,leg  the patient's father died at the age of 42 from metastatic head and neck carcinoma. The patient's mother died of breast cancer the age of 46. The patient had 2 brothers, both with prostate cancer. She has 4 sisters, one diagnosed with breast cancer at the age of 55, another 1 recent underwent bilateral mastectomies. Another one died from colon cancer.   GYNECOLOGIC HISTORY:   menarche age 13, she is GX P0. She underwent menopause at age 32, status post HRT for 12 years. She status post right salpingo-oophorectomy.   SOCIAL HISTORY:  She works at [employer] in [place]. Her husband died  at [employer] from complications of surgery after a 7-1/2 month hospitalization) 2008). She lives by herself, with no pets. She tells me she works 9 hours a day 5 days a week.     ADVANCED DIRECTIVES: In place   HEALTH MAINTENANCE: Social History  Substance Use Topics  . Smoking status: Former Smoker    Quit date: 11/22/1982  . Smokeless tobacco: Never Used     Comment: Quit smoking x 32 years  . Alcohol Use: No     Colonoscopy:  PAP:  Bone density:  Lipid panel:  Allergies  Allergen Reactions  . Aspirin Nausea And Vomiting  . Boost Pudding [Nutritional Supplements] Diarrhea  . Percodan [Oxycodone-Aspirin] Nausea Only  . Codeine Nausea Only    Current Outpatient Prescriptions  Medication Sig Dispense Refill  . dexamethasone (DECADRON) 2 MG tablet Take 1 tablet (2 mg total) by mouth every 12 (twelve) hours.    . feeding supplement (BOOST / RESOURCE BREEZE) LIQD Take 1 Container by mouth 2 (two) times daily between meals.  0  . fentaNYL (DURAGESIC - DOSED MCG/HR) 50 MCG/HR Place 1 patch (50 mcg total) onto the skin every 3 (three) days. 10 patch 0  . gabapentin (NEURONTIN) 100 MG capsule Take 1 capsule (100 mg total) by mouth at bedtime. 30 capsule 1  . lidocaine (LIDODERM) 5 % Place 1 patch onto the skin daily. Remove & Discard patch within 12 hours or as directed by MD 30 patch 0  . magic mouthwash w/lidocaine SOLN Take 10 mLs by mouth 3 (three) times daily as needed for mouth pain.  0  . magnesium oxide (MAG-OX) 400 (241.3 Mg) MG tablet Take 1 tablet (400 mg total) by mouth daily. 30 tablet 0  . morphine (MSIR) 15 MG tablet Take 1 tablet (15 mg total) by mouth every 4 (four) hours as needed for severe pain. Once every 12 hours as needed 12 tablet 0  . ondansetron (ZOFRAN-ODT) 8 MG disintegrating tablet DISSOLVE 1 TABLET ON THE TONGUE EVERY 8 HOURS AS NEEDED FOR NAUSEA    . pantoprazole (PROTONIX) 40 MG tablet Take 1 tablet (40 mg total) by mouth daily.    Vladimir Faster  Glycol-Propyl Glycol (SYSTANE OP) Apply 1 drop to eye daily as needed (dry eyes).    . vancomycin (VANCOCIN) 1-5 GM/200ML-% SOLN Inject 200 mLs (1,000 mg total) into the vein daily. 4000 mL 0  . warfarin (COUMADIN) 4 MG tablet Take 1 tablet (4 mg total) by mouth daily. (Patient taking differently: Take 2-4 mg by mouth daily. Take 2 mg on Mon thru Thurs & Take 4 mg on Fri, Sat, Sun) 90 tablet 4  . warfarin (COUMADIN) 4 MG tablet TAKE 1 TABLET BY MOUTH ON MONDAYS AND FRIDAYS, AND TAKE 1/2 TABLET BY MOUTH ON ALL OTHER DAYS 90 tablet 0   No current facility-administered medications for this  visit.    OBJECTIVE: Middle-aged white woman who Appears stated age There were no vitals filed for this visit.   There is no weight on file to calculate BMI.    ECOG FS:1 - Symptomatic but completely ambulatory There were no vitals filed for this visit. Sclerae unicteric, pupils round and equal Oropharynx clear and moist-- no thrush or other lesions No cervical or supraclavicular adenopathy Lungs no rales or rhonchi Heart regular rate and rhythm Abd soft, nontender, positive bowel sounds MSK no focal spinal tenderness, no upper extremity lymphedema Neuro: nonfocal, well oriented, agitated affect Breasts: Deferred  LAB RESULTS: CBC    Component Value Date/Time   WBC 13.2* 10/02/2015 1356   WBC 7.1 09/06/2015 1023   RBC 2.89* 10/02/2015 1356   RBC 3.02* 09/06/2015 1023   HGB 9.2* 10/02/2015 1356   HGB 10.6* 09/06/2015 1023   HCT 28.1* 10/02/2015 1356   HCT 32.2* 09/06/2015 1023   PLT 137* 10/02/2015 1356   PLT 245 09/06/2015 1023   MCV 97.2 10/02/2015 1356   MCV 106.8* 09/06/2015 1023   MCH 31.8 10/02/2015 1356   MCH 35.2* 09/06/2015 1023   MCHC 32.7 10/02/2015 1356   MCHC 33.0 09/06/2015 1023   RDW 17.9* 10/02/2015 1356   RDW 18.8* 09/06/2015 1023   LYMPHSABS 0.5* 10/02/2015 1356   LYMPHSABS 0.1* 09/06/2015 1023   MONOABS 0.5 10/02/2015 1356   MONOABS 0.7 09/06/2015 1023   EOSABS 0.0  10/02/2015 1356   EOSABS 0.0 09/06/2015 1023   BASOSABS 0.0 10/02/2015 1356   BASOSABS 0.0 09/06/2015 1023      Recent Labs Lab 10/04/15 0345  INR 1.57*    Urinalysis    Component Value Date/Time   COLORURINE YELLOW 09/24/2015 [place] 09/24/2015 1455   LABSPEC 1.013 09/24/2015 1455   PHURINE 5.0 09/24/2015 1455   GLUCOSEU NEGATIVE 09/24/2015 1455   HGBUR TRACE* 09/24/2015 1455   [place] 09/24/2015 1455   KETONESUR NEGATIVE 09/24/2015 [place] 09/24/2015 1455   UROBILINOGEN 0.2 01/23/2015 1830   NITRITE NEGATIVE 09/24/2015 1455   LEUKOCYTESUR NEGATIVE 09/24/2015 1455    STUDIES: Dg Chest 1 View  09/24/2015  CLINICAL DATA:  Position of PICC. EXAM: CHEST 1 VIEW COMPARISON:  Radiograph of Sep 15, 2015.  CT scan of Sep 20, 2015. FINDINGS: The heart size and mediastinal contours are within normal limits. Interval placement of right-sided PICC line, with distal tip in expected position of cavoatrial junction. No pneumothorax or significant pleural effusion is noted. Right axillary surgical clips are noted. Cavitary lesion is seen in the left upper lobe concerning for pulmonary abscess. Another cavitary lesion is seen in the right upper lobe. The visualized skeletal structures are unremarkable. IMPRESSION: Interval placement of right-sided PICC line, with distal tip in expected position of cavoatrial junction. Bilateral cavitary lesions are noted most consistent with cavitary pneumonia. Electronically Signed   By: Marijo Conception, M.D.   On: 09/24/2015 09:12   Ct Chest Wo Contrast  09/20/2015  CLINICAL DATA:  Pneumonia. Left-sided chest pain. Personal history of breast cancer with bone metastases. EXAM: CT CHEST WITHOUT CONTRAST TECHNIQUE: Multidetector CT imaging of the chest was performed following the standard protocol without IV contrast. COMPARISON:  Chest radiography 09/15/2015.  Chest CT 09/11/2015. FINDINGS: Again demonstrated is  benign appearing pleural and parenchymal scarring at both lung apices. There is a background pattern of centrilobular emphysema particularly evident in the upper lobes. Patient has developed multiple widely disseminated cavitary infiltrates.  There are 2 adjacent regions of cavitary infiltration in the left upper lobe responsible for the chest radiographic finding. The larger area measures 3.6 cm in diameter. The smaller area measures 2.8 cm in diameter. There is no pleural or pericardial fluid. No enlarged hilar or mediastinal lymph nodes are identified. Widespread bony metastatic changes are present as seen previously. Scans in the upper abdomen do not show any acute finding. A region of hazy infiltrate in the right lower lobe with 2 areas of cavitary infiltration is newly developed. The larger region measures 3.5 cm in diameter in the smaller region measures 1.4 cm in diameter. IMPRESSION: Development of multiple areas of cavitary infiltration in both the right and left lung, which have developed rapidly since a CT study of 9 days ago at which time day were not present at all. Lesions developing this rapidly must be inflammatory and could represent spontaneous pulmonary abscesses or could represent abscesses secondary to septic emboli. Certainly, atypical organisms must be considered including fungal disease. Electronically Signed   By: Nelson Chimes M.D.   On: 09/20/2015 20:41   Ir Fluoro Guide Cv Line Right  10/02/2015  INDICATION: History of recurrent metastatic breast cancer. In need of durable intravenous access for chemotherapy administration. EXAM: IMPLANTED PORT A CATH PLACEMENT WITH ULTRASOUND AND FLUOROSCOPIC GUIDANCE COMPARISON:  Chest CT - 09/20/2015 MEDICATIONS: Vancomycin 1 gm IV; The antibiotic was administered within an appropriate time interval prior to skin puncture. ANESTHESIA/SEDATION: Moderate (conscious) sedation was employed during this procedure. A total of Versed 2 mg and Fentanyl 75 mcg  was administered intravenously. Moderate Sedation Time: 27 minutes. The patient's level of consciousness and vital signs were monitored continuously by radiology nursing throughout the procedure under my direct supervision. CONTRAST:  None FLUOROSCOPY TIME:  30 seconds (3 mGy) COMPLICATIONS: None immediate. PROCEDURE: The procedure, risks, benefits, and alternatives were explained to the patient. Questions regarding the procedure were encouraged and answered. The patient understands and consents to the procedure. The right neck and chest were prepped with chlorhexidine in a sterile fashion, and a sterile drape was applied covering the operative field. Maximum barrier sterile technique with sterile gowns and gloves were used for the procedure. A timeout was performed prior to the initiation of the procedure. Local anesthesia was provided with 1% lidocaine with epinephrine. After creating a small venotomy incision, a micropuncture kit was utilized to access the internal jugular vein under direct, real-time ultrasound guidance. Ultrasound image documentation was performed. The microwire was kinked to measure appropriate catheter length. A subcutaneous port pocket was then created along the upper chest wall utilizing a combination of sharp and blunt dissection. The pocket was irrigated with sterile saline. A single lumen thin power injectable port was chosen for placement. The 8 Fr catheter was tunneled from the port pocket site to the venotomy incision. The port was placed in the pocket. The external catheter was trimmed to appropriate length. At the venotomy, an 8 Fr peel-away sheath was placed over a guidewire under fluoroscopic guidance. The catheter was then placed through the sheath and the sheath was removed. Final catheter positioning was confirmed and documented with a fluoroscopic spot radiograph. The port was accessed with a Huber needle, aspirated and flushed. The venotomy site was closed with an interrupted  4-0 Vicryl suture. The port pocket incision was closed with interrupted 2-0 Vicryl suture and the skin was opposed with a running subcuticular 4-0 Vicryl suture. Dermabond and Steri-strips were applied to both incisions. The Port a Catheter was  left accessed as requested by the provided clinical team. Dressings were placed. The patient tolerated the procedure well without immediate post procedural complication. FINDINGS: After catheter placement, the tip lies within the superior cavoatrial junction. The catheter aspirates and flushes normally and is ready for immediate use. IMPRESSION: Successful placement of a right internal jugular approach power injectable Port-A-Cath. The catheter is ready for immediate use. Electronically Signed   By: Sandi Mariscal M.D.   On: 10/02/2015 09:55   Ir US Guide Vasc Access Right  10/02/2015  INDICATION: History of recurrent metastatic breast cancer. In need of durable intravenous access for chemotherapy administration. EXAM: IMPLANTED PORT A CATH PLACEMENT WITH ULTRASOUND AND FLUOROSCOPIC GUIDANCE COMPARISON:  Chest CT - 09/20/2015 MEDICATIONS: Vancomycin 1 gm IV; The antibiotic was administered within an appropriate time interval prior to skin puncture. ANESTHESIA/SEDATION: Moderate (conscious) sedation was employed during this procedure. A total of Versed 2 mg and Fentanyl 75 mcg was administered intravenously. Moderate Sedation Time: 27 minutes. The patient's level of consciousness and vital signs were monitored continuously by radiology nursing throughout the procedure under my direct supervision. CONTRAST:  None FLUOROSCOPY TIME:  30 seconds (3 mGy) COMPLICATIONS: None immediate. PROCEDURE: The procedure, risks, benefits, and alternatives were explained to the patient. Questions regarding the procedure were encouraged and answered. The patient understands and consents to the procedure. The right neck and chest were prepped with chlorhexidine in a sterile fashion, and a sterile  drape was applied covering the operative field. Maximum barrier sterile technique with sterile gowns and gloves were used for the procedure. A timeout was performed prior to the initiation of the procedure. Local anesthesia was provided with 1% lidocaine with epinephrine. After creating a small venotomy incision, a micropuncture kit was utilized to access the internal jugular vein under direct, real-time ultrasound guidance. Ultrasound image documentation was performed. The microwire was kinked to measure appropriate catheter length. A subcutaneous port pocket was then created along the upper chest wall utilizing a combination of sharp and blunt dissection. The pocket was irrigated with sterile saline. A single lumen thin power injectable port was chosen for placement. The 8 Fr catheter was tunneled from the port pocket site to the venotomy incision. The port was placed in the pocket. The external catheter was trimmed to appropriate length. At the venotomy, an 8 Fr peel-away sheath was placed over a guidewire under fluoroscopic guidance. The catheter was then placed through the sheath and the sheath was removed. Final catheter positioning was confirmed and documented with a fluoroscopic spot radiograph. The port was accessed with a Huber needle, aspirated and flushed. The venotomy site was closed with an interrupted 4-0 Vicryl suture. The port pocket incision was closed with interrupted 2-0 Vicryl suture and the skin was opposed with a running subcuticular 4-0 Vicryl suture. Dermabond and Steri-strips were applied to both incisions. The Port a Catheter was left accessed as requested by the provided clinical team. Dressings were placed. The patient tolerated the procedure well without immediate post procedural complication. FINDINGS: After catheter placement, the tip lies within the superior cavoatrial junction. The catheter aspirates and flushes normally and is ready for immediate use. IMPRESSION: Successful  placement of a right internal jugular approach power injectable Port-A-Cath. The catheter is ready for immediate use. Electronically Signed   By: Sandi Mariscal M.D.   On: 10/02/2015 09:55   Dg Chest Port 1 View  09/15/2015  CLINICAL DATA:  Short breath, cough, fever EXAM: PORTABLE CHEST 1 VIEW COMPARISON:  08/31/2015 FINDINGS: Normal  cardiac silhouette with ectatic aorta. Increased density in the LEFT upper lobe. The biapical densities are not changed from CT. Small LEFT effusion. No pneumothorax. IMPRESSION: 1. Increasing density in the LEFT upper lobe. Concerning for edema or infection. Electronically Signed   By: Suzy Bouchard M.D.   On: 09/15/2015 07:26   US Abdomen Limited Ruq  09/15/2015  CLINICAL DATA:  Elevated liver function tests. EXAM: US ABDOMEN LIMITED - RIGHT UPPER QUADRANT COMPARISON:  08/31/2015, PET-CT. FINDINGS: Gallbladder: Surgically absent Common bile duct: Diameter: Dilated to a maximum of 17 mm, unchanged from the recent prior PET-CT. No sonographic evidence of a duct stone. Liver: Cyst in the left liver lobe measuring 13 somewhat mottled appearance of the liver in its more posterior aspect, which could be technique. The possibility of numerous small infiltrating lesion should be considered in the patient's clinical setting. Liver is normal in size and overall echogenicity. IMPRESSION: 1. Chronically dilated common bile duct. Status post cholecystectomy. 2. Mottled appearance of the posterior liver. Consider followup liver MRI with and without contrast. Electronically Signed   By: Lajean Manes M.D.   On: 09/15/2015 10:04    ASSESSMENT: 70 y.o. [place], [place] woman status post right lumpectomy and axillary lymph node dissection December 2006 for a [place] N2, stage IIIA invasive ductal carcinoma, estrogen and progesterone receptor positive, with subsequent stage IV disease  (1) received adjuvant doxorubicin, docetaxel and cyclophosphamide  (2) status post adjuvant radiation  (3) on  tamoxifen to September 2011 when she was noted to have metastatic disease to bone  (4) fulvestrant 02/06/2009 to 01/15/2011  (5) left axillary lymph node positive for an invasive ductal carcinoma, estrogen receptor and HER-2 negative November 2011, and a right breast recurrence for an estrogen receptor positive lesion at the same time.  (6) status post bilateral mastectomies 06/28/2010  (7) status post left chest and axillary radiation  (8) status post right chest wall recurrence October of 2012, treated with electron beam radiation completed December 2012  (9) progression in bone August 2013,  (10) status post radiation to the lumbosacral spine completed October 2013   (11) capecitabine November 2013 through January 2014  (12) tamoxifen/ everolimus February 2014, discontinued November 2014 with progression  (13) Megace 80 mg twice daily with progression January 2015  (14) Radiation to lumbar spine February 2015  (15) letrozole plus Ibrance started 07/08/2013, discontinued May 2017 with progression  (16). Declines genetic testing (as of 08/17/2013)  (17) zolendronate 05/01/2009, discontinued because of pain; Xgeva started 10/08/2013, discontinued because of cost issues as of November 2015, switched to alendronate March 2016, switched back to [place] but stopped after the May 2016 dose because of side effects, on ibandronate [125 mg/ day 21 days on, 7 days off] temporarily, denosumab to be resumed 10/02/2015  (18) cyclophosphamide, methotrexate, and fluorouracil to be started 10/09/2015, repeated every 21 days.  (a) s/p Right port placement 10/02/2015  (19) palliative radiation completed 10/02/2015  OTHER ACTIVE PROBLEMS:  (1) chronic GI complaints, followed by Dr. Sydell Axon  (a) treated for c difficile colitis Sept 2016 (stool Ag positive, toxin negative 01/23/2015), received oral vancomycin started 01/24/2015, stopped 02/07/2015  (2) multiple orthopedic problems, including  bilateral chronic foot pain, history of rotator cuff repair  (3) history of extensive superficial phlebitis and remote DVT, on chronic low dose Coumadin.  (4) LUL cavitary pneumonia diagnosed as MRSA while at [facility] April 2016, treated with Zosyn x 21 days  (a) recurrent MRSA sepsis/ pneumonia---on IV vancomycin through 10/24/2015--09/26/2015 blood  cultures negative  PLAN: We spent approximately 50 minutes going over [name]'s complex situation today. [name]'s cancer has finally learned to get around her current treatment and we are stopping the palbociclib and letrozole.  We went through the 3 questions we always goes through when dealing with stage IV breast cancer. The first question is whether to treat or go for comfort care. She very much wants to be treated and aggressively if necessary.  We then discussed the second question, whether to go for other anti-estrogens or chemotherapy. At this point I feel we have run through pretty much all the anti-estrogens, and while we can recycle them I think the chance of getting a significant response for them is low. Accordingly and suggesting we go with chemotherapy.  The third question is, which specific agents to use. If she lived in [place] I would probably suggest [place], which is generally well-tolerated, but this is a day one and day 8 drug. I think is more sensible to try a 21 day treatment given the distance she has to, and so we're going to give CMF a try.  Today we discussed the possible toxicities, side effects and complications of this combination. She is going to receive the first treatment on June 5. We are going to also resume the denosumab, which will help her bones, and this will be given every 6 weeks, with every other CMF treatment.  She will need a port and this is being operational lysed, hopefully next week. I have asked her to stop her Coumadin for 3 days before the procedure.  I generally check lab work day 8  after a CMF treatment. This could be done through her local physician, and we will review that when she returns to see me on the day of treatment  She is also scheduled to meet with Dr. Sondra Come next week to consider palliative radiation to some of the skull lesions, which are particular concern.  I don't think her tremor is disabling at this point but we are going to try low-dose Coreg to see if that helps. As far as her pain is concerned we are continuing the current combination of hydrocodone/Tylenol and MSIR, which is working well for her. She was again encouraged to make sure to have soft bowel movements at least every other day. Also she needs to taper off her Decadron. We will start that next week.  Judeen Hammans was very agitated today. I was pleased that she brought a friend with her, but I'm concerned that she may not have the support she really needs locally. We have asked her to call us for any problems that may develop before her next visit here.   Chauncey Cruel, MD   10/09/2015 7:21 AM

## 2015-10-10 DIAGNOSIS — D638 Anemia in other chronic diseases classified elsewhere: Secondary | ICD-10-CM

## 2015-10-11 ENCOUNTER — Ambulatory Visit: Payer: Medicare PPO

## 2015-10-11 ENCOUNTER — Ambulatory Visit (HOSPITAL_BASED_OUTPATIENT_CLINIC_OR_DEPARTMENT_OTHER): Payer: Medicare PPO

## 2015-10-11 ENCOUNTER — Telehealth: Payer: Self-pay | Admitting: Nurse Practitioner

## 2015-10-11 ENCOUNTER — Encounter: Payer: Self-pay | Admitting: Nurse Practitioner

## 2015-10-11 ENCOUNTER — Ambulatory Visit (HOSPITAL_BASED_OUTPATIENT_CLINIC_OR_DEPARTMENT_OTHER): Payer: Medicare PPO | Admitting: Nurse Practitioner

## 2015-10-11 VITALS — BP 135/97 | HR 88 | Temp 98.1°F | Resp 18 | Ht 62.0 in | Wt 134.2 lb

## 2015-10-11 DIAGNOSIS — C50912 Malignant neoplasm of unspecified site of left female breast: Secondary | ICD-10-CM

## 2015-10-11 DIAGNOSIS — Z86711 Personal history of pulmonary embolism: Secondary | ICD-10-CM | POA: Diagnosis not present

## 2015-10-11 DIAGNOSIS — C773 Secondary and unspecified malignant neoplasm of axilla and upper limb lymph nodes: Secondary | ICD-10-CM

## 2015-10-11 DIAGNOSIS — M545 Low back pain: Secondary | ICD-10-CM | POA: Diagnosis not present

## 2015-10-11 DIAGNOSIS — C44501 Unspecified malignant neoplasm of skin of breast: Secondary | ICD-10-CM

## 2015-10-11 DIAGNOSIS — Z8672 Personal history of thrombophlebitis: Secondary | ICD-10-CM

## 2015-10-11 DIAGNOSIS — C50919 Malignant neoplasm of unspecified site of unspecified female breast: Secondary | ICD-10-CM

## 2015-10-11 DIAGNOSIS — R5383 Other fatigue: Secondary | ICD-10-CM

## 2015-10-11 DIAGNOSIS — Z5111 Encounter for antineoplastic chemotherapy: Secondary | ICD-10-CM

## 2015-10-11 DIAGNOSIS — R0781 Pleurodynia: Secondary | ICD-10-CM

## 2015-10-11 DIAGNOSIS — C7951 Secondary malignant neoplasm of bone: Secondary | ICD-10-CM

## 2015-10-11 DIAGNOSIS — J984 Other disorders of lung: Secondary | ICD-10-CM

## 2015-10-11 DIAGNOSIS — K1230 Oral mucositis (ulcerative), unspecified: Secondary | ICD-10-CM

## 2015-10-11 DIAGNOSIS — R0602 Shortness of breath: Secondary | ICD-10-CM

## 2015-10-11 LAB — PROTIME-INR
INR: 3.4 (ref 2.00–3.50)
PROTIME: 40.8 s — AB (ref 10.6–13.4)

## 2015-10-11 LAB — TECHNOLOGIST REVIEW

## 2015-10-11 LAB — COMPREHENSIVE METABOLIC PANEL
ALT: 33 U/L (ref 0–55)
AST: 29 U/L (ref 5–34)
Albumin: 2.4 g/dL — ABNORMAL LOW (ref 3.5–5.0)
Alkaline Phosphatase: 177 U/L — ABNORMAL HIGH (ref 40–150)
Anion Gap: 8 mEq/L (ref 3–11)
BILIRUBIN TOTAL: 0.63 mg/dL (ref 0.20–1.20)
BUN: 35.2 mg/dL — ABNORMAL HIGH (ref 7.0–26.0)
CO2: 34 meq/L — AB (ref 22–29)
Calcium: 9.1 mg/dL (ref 8.4–10.4)
Chloride: 96 mEq/L — ABNORMAL LOW (ref 98–109)
Creatinine: 0.9 mg/dL (ref 0.6–1.1)
EGFR: 67 mL/min/{1.73_m2} — AB (ref >90)
GLUCOSE: 95 mg/dL (ref 70–140)
Potassium: 4.3 mEq/L (ref 3.5–5.1)
SODIUM: 139 meq/L (ref 136–145)
TOTAL PROTEIN: 6.9 g/dL (ref 6.4–8.3)

## 2015-10-11 LAB — CBC WITH DIFFERENTIAL/PLATELET
BASO%: 0.2 % (ref 0.0–2.0)
Basophils Absolute: 0 10*3/uL (ref 0.0–0.1)
EOS%: 0 % (ref 0.0–7.0)
Eosinophils Absolute: 0 10*3/uL (ref 0.0–0.5)
HCT: 33.5 % — ABNORMAL LOW (ref 34.8–46.6)
HGB: 10.8 g/dL — ABNORMAL LOW (ref 11.6–15.9)
LYMPH%: 1.5 % — ABNORMAL LOW (ref 14.0–49.7)
MCH: 31.6 pg (ref 25.1–34.0)
MCHC: 32.2 g/dL (ref 31.5–36.0)
MCV: 98 fL (ref 79.5–101.0)
MONO#: 0.6 10*3/uL (ref 0.1–0.9)
MONO%: 4.9 % (ref 0.0–14.0)
NEUT%: 93.4 % — ABNORMAL HIGH (ref 38.4–76.8)
NEUTROS ABS: 12.1 10*3/uL — AB (ref 1.5–6.5)
NRBC: 1 % — AB (ref 0–0)
Platelets: 198 10*3/uL (ref 145–400)
RBC: 3.42 10*6/uL — AB (ref 3.70–5.45)
RDW: 17.5 % — AB (ref 11.2–14.5)
WBC: 13 10*3/uL — AB (ref 3.9–10.3)
lymph#: 0.2 10*3/uL — ABNORMAL LOW (ref 0.9–3.3)

## 2015-10-11 MED ORDER — FLUOROURACIL CHEMO INJECTION 2.5 GM/50ML
600.0000 mg/m2 | Freq: Once | INTRAVENOUS | Status: AC
  Administered 2015-10-11: 950 mg via INTRAVENOUS
  Filled 2015-10-11: qty 19

## 2015-10-11 MED ORDER — SODIUM CHLORIDE 0.9% FLUSH
10.0000 mL | INTRAVENOUS | Status: DC | PRN
  Administered 2015-10-11: 10 mL
  Filled 2015-10-11: qty 10

## 2015-10-11 MED ORDER — HEPARIN SOD (PORK) LOCK FLUSH 100 UNIT/ML IV SOLN
500.0000 [IU] | Freq: Once | INTRAVENOUS | Status: AC | PRN
  Administered 2015-10-11: 500 [IU]
  Filled 2015-10-11: qty 5

## 2015-10-11 MED ORDER — VALACYCLOVIR HCL 500 MG PO TABS: 500.0000 mg | ORAL_TABLET | Freq: Every day | ORAL | Status: AC

## 2015-10-11 MED ORDER — PALONOSETRON HCL INJECTION 0.25 MG/5ML
INTRAVENOUS | Status: AC
  Filled 2015-10-11: qty 5

## 2015-10-11 MED ORDER — METHOTREXATE SODIUM (PF) CHEMO INJECTION 250 MG/10ML
40.0000 mg/m2 | Freq: Once | INTRAMUSCULAR | Status: AC
  Administered 2015-10-11: 62.5 mg via INTRAVENOUS
  Filled 2015-10-11: qty 2.5

## 2015-10-11 MED ORDER — SODIUM CHLORIDE 0.9 % IV SOLN
600.0000 mg/m2 | Freq: Once | INTRAVENOUS | Status: AC
  Administered 2015-10-11: 940 mg via INTRAVENOUS
  Filled 2015-10-11: qty 47

## 2015-10-11 MED ORDER — DENOSUMAB 120 MG/1.7ML ~~LOC~~ SOLN
120.0000 mg | Freq: Once | SUBCUTANEOUS | Status: AC
  Administered 2015-10-11: 120 mg via SUBCUTANEOUS
  Filled 2015-10-11: qty 1.7

## 2015-10-11 MED ORDER — SODIUM CHLORIDE 0.9 % IV SOLN
10.0000 mg | Freq: Once | INTRAVENOUS | Status: AC
  Administered 2015-10-11: 10 mg via INTRAVENOUS
  Filled 2015-10-11: qty 1

## 2015-10-11 MED ORDER — SODIUM CHLORIDE 0.9 % IV SOLN
Freq: Once | INTRAVENOUS | Status: AC
  Administered 2015-10-11: 14:00:00 via INTRAVENOUS

## 2015-10-11 MED ORDER — PALONOSETRON HCL INJECTION 0.25 MG/5ML
0.2500 mg | Freq: Once | INTRAVENOUS | Status: AC
Start: 2015-10-11 — End: 2015-10-11
  Administered 2015-10-11: 0.25 mg via INTRAVENOUS

## 2015-10-11 NOTE — Progress Notes (Signed)
[name]  Telephone:([phone]) [phone] Fax:(336) [phone]     ID: [name] OB: 10/14/1945  MR#: [id number]  PJA#:[id number]  PCP: Curlene Labrum, MD GYN:  Gari Crown  SU:  OTHER MD: Gery Pray, Sherre Poot  CHIEF COMPLAINT: stage IV breast cancer  CURRENT TREATMENT: CMF; denosumab  BREAST CANCER HISTORY: From Dr. Adria Dill summary 07/14/2013::  "Complex 70 year old [name] with history of ER-positive and ER negative metachronous primary bilateral breast cancers.  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 Progression in bone while on tamoxifen September 2011. Initial complete response to monthly Faslodex injections.She developed a malignant left axillary lymph node with no clear primary in the left breast November 2011. This tumor was ER negative. HER-2 negative. While under evaluation for this lesion she developed a Carolyn lesion in the right breast which was ER positive. She underwent bilateral mastectomies 06/28/2010. She was continued on hormonal therapy with monthly Faslodex injections in view of the ER positive component of her tumor with a complete response to this hormone in bone. She underwent radiation to the left chest and axilla subsequent to the mastectomies. She developed a cutaneous recurrence on the right chest wall along the medial aspect of the right mastectomy scar, biopsy proven, initially detected on clinical exam by her radiation oncologist on 02/14/2011. She was treated with electron beam radiation.. 3000 cGy in 15 fractions through December 2012. Further progression in bone in August 2013. Palliative radiation to lumbosacral spine September through October 2013. Brief trial of Xeloda November 2013 through January 2014. Change to tamoxifen plus  [employer] February 2014 stopped for progression and 25 pound weight loss November 2014. Trial of Megace 80 mg twice a day with improved appetite but progressive rise in tumor marker and progressive low back pain with further progression on MRI of the spine done January 2015. Radiation to the lumbar spine February 2015. She started most recent salvage regimen with a combination of Ibrance plus Femara on 07/08/2013. It is too early to assess response."  The patient's breast cancer history is summarized in detailed below   INTERVAL HISTORY: [name] returns today for follow up of her metastatic breast cancer. She is due to start day 1, cycle 1 of CMF today. She will also be resuming denosumab during this visit. She was discharged from the hospital on 6/7 to a rehabilitation facility in [place]. She was discharged on home O2 which she is wearing today.   REVIEW OF SYSTEMS: [name] denies fevers or chills. She is nauseous and is using zofran PRN. She is moving her bowels well, perhaps slightly constipated, but refuses stool softeners. She takes morphine every 3hrs for back and rib pain. Her appetite is down and she is losing weight, but cannot take Boost or Ensure because it gives her diarrhea. She has a sore on her inner lip that has not resolved with magic mouthwash. She is seated in a wheelchair today. She complains of fatigue and shortness of breath, but no chest pain, cough, or palpitations. She is anxious and irritated today. A detailed review of systems is otherwise stable.  PAST MEDICAL HISTORY: Past Medical History  Diagnosis Date  . Cancer (HCC)     RBreast, Lower back , Under l Arm  . Hypertension   . GERD (gastroesophageal reflux disease)   . Breast cancer metastasized to bone ([place]) 04/30/2011  .  Radiation 01/15/2012    Lumbosacral spine 3500 cGy 14  fx  . Swollen R ankle 08/10/2012  . Fracture of ankle, medial malleolus, right, closed 08/10/2012  . Tubular adenoma 2011  . Hiatal hernia 2011    . Schatzki's ring   . Anorexia 03/02/2013  . Weight loss due to medication 03/02/2013  . C. difficile colitis   . Pneumonia   . 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 Gallbladder surgery  1991  . Mastectomy  06/28/10    bilateral- Dr. Rosebud Poles, [place]  . Inguinal hernia repair  90's    right  . Parathyroidectomy  90's  . Carpal tunnel release  90's  . Oophorectomy  90's    right  . Lymph node dissection    . Breast surgery    . Cholecystectomy    . Colonoscopy  01/08/10    Dr. [name]- anal tag,hemorrhoid o/w normal rectum, pancolonic diverticula, diminutive polyp in the base of the cecum= tubular adenoma on bx. poor prep  . Esophagogastroduodenoscopy  01/08/10    Dr. [name]- normal esophagus, small hiatal hernia, antum and body erosions, pedunculated polyp between D1 and D2, duodenal diverticulum, lymphangiectasia, second portion of the duodenum.stomach bx= inflammation, duodenum bx= adenoma  . Rotator cuff repair  08/27/11    Dr. Alphonzo Cruise- [facility]- [place] [facility]  . Esophagogastroduodenoscopy (egd) with propofol N/A 11/19/2012    RMR: non-critical Schatzki's ring s/p dilation with 52 F, multiple gastric polyps, duodenal polyp s/p piecemeal snare polypectomy, gastric polypectomy, path benign  . Maloney dilation N/A 11/19/2012    Procedure: Venia Minks DILATION;  Surgeon: Daneil Dolin, MD;  Location: [facility];  Service: Endoscopy;  Laterality: N/A;  #[id number]  . Polypectomy N/A 11/19/2012    Procedure: POLYPECTOMY;  Surgeon: Daneil Dolin, MD;  Location: [facility];  Service: Endoscopy;  Laterality: N/A;  duodenal  . Biopsy N/A 11/19/2012    Procedure: BIOPSY;  Surgeon: Daneil Dolin, MD;  Location: [facility];  Service: Endoscopy;  Laterality: N/A;  esophageal  . Tee without cardioversion N/A 09/21/2015    Procedure: TRANSESOPHAGEAL ECHOCARDIOGRAM (TEE);  Surgeon: Sanda Klein, MD;  Location: [facility] ENDOSCOPY;  Service: Cardiovascular;   Laterality: N/A;    FAMILY HISTORY Family History  Problem Relation Age of Onset  . Cancer Mother     Colon  . Cancer Father     Lung, Mouth,leg   the patient's father died at the age of 11 from metastatic head and neck carcinoma. The patient's mother died of breast cancer the age of 22. The patient had 2 brothers, both with prostate cancer. She has 4 sisters, one diagnosed with breast cancer at the age of 29, another 1 recent underwent bilateral mastectomies. Another one died from colon cancer.   GYNECOLOGIC HISTORY:   menarche age 50, she is GX P0. She underwent menopause at age 83, status post HRT for 12 years. She status post right salpingo-oophorectomy.   SOCIAL HISTORY:  She works at [employer] in [place]. Her husband died at [facility] [place] from complications of surgery after a 7-1/2 month hospitalization) 2008). She lives by herself, with no pets. She tells me she works 9 hours a day 5 days a week.     ADVANCED DIRECTIVES: In place   HEALTH MAINTENANCE: Social History  Substance Use Topics  . Smoking status: Former Smoker    Quit date: 11/22/1982  . Smokeless tobacco:  Never Used     Comment: Quit smoking x 32 years  . Alcohol Use: No     Colonoscopy:  PAP:  Bone density:  Lipid panel:  Allergies  Allergen Reactions  . Aspirin Nausea And Vomiting  . Boost Pudding [Nutritional Supplements] Diarrhea  . Percodan [Oxycodone-Aspirin] Nausea Only  . Codeine Nausea Only    Current Outpatient Prescriptions  Medication Sig Dispense Refill  . dexamethasone (DECADRON) 2 MG tablet Take 1 tablet (2 mg total) by mouth every 12 (twelve) hours.    Marland Kitchen dexamethasone (DECADRON) 4 MG tablet Take 2 tablets (8 mg total) by mouth daily. Start the day after chemotherapy for 2 days. Take with food. 30 tablet 1  . feeding supplement (BOOST / RESOURCE BREEZE) LIQD Take 1 Container by mouth 2 (two) times daily between meals.  0  . fentaNYL (DURAGESIC - DOSED MCG/HR) 50  MCG/HR Place 1 patch (50 mcg total) onto the skin every 3 (three) days. 10 patch 0  . gabapentin (NEURONTIN) 100 MG capsule Take 1 capsule (100 mg total) by mouth at bedtime. 30 capsule 1  . lidocaine (LIDODERM) 5 % Place 1 patch onto the skin daily. Remove & Discard patch within 12 hours or as directed by MD 30 patch 0  . lidocaine-prilocaine (EMLA) cream Apply to affected area once 30 g 3  . magic mouthwash w/lidocaine SOLN Take 10 mLs by mouth 3 (three) times daily as needed for mouth pain.  0  . magnesium oxide (MAG-OX) 400 (241.3 Mg) MG tablet Take 1 tablet (400 mg total) by mouth daily. 30 tablet 0  . morphine (MSIR) 15 MG tablet Take 1 tablet (15 mg total) by mouth every 4 (four) hours as needed for severe pain. Once every 12 hours as needed 12 tablet 0  . ondansetron (ZOFRAN-ODT) 8 MG disintegrating tablet DISSOLVE 1 TABLET ON THE TONGUE EVERY 8 HOURS AS NEEDED FOR NAUSEA    . pantoprazole (PROTONIX) 40 MG tablet Take 1 tablet (40 mg total) by mouth daily.    Vladimir Faster Glycol-Propyl Glycol (SYSTANE OP) Apply 1 drop to eye daily as needed (dry eyes).    . prochlorperazine (COMPAZINE) 10 MG tablet Take 1 tablet (10 mg total) by mouth every 6 (six) hours as needed (Nausea or vomiting). 30 tablet 1  . valACYclovir (VALTREX) 500 MG tablet Take 1 tablet (500 mg total) by mouth daily. 30 tablet 1  . vancomycin (VANCOCIN) 1-5 GM/200ML-% SOLN Inject 200 mLs (1,000 mg total) into the vein daily. 4000 mL 0  . warfarin (COUMADIN) 4 MG tablet Take 1 tablet (4 mg total) by mouth daily. (Patient taking differently: Take 2-4 mg by mouth daily. Take 2 mg on Mon thru Thurs & Take 4 mg on Fri, Sat, Sun) 90 tablet 4  . warfarin (COUMADIN) 4 MG tablet TAKE 1 TABLET BY MOUTH ON MONDAYS AND FRIDAYS, AND TAKE 1/2 TABLET BY MOUTH ON ALL OTHER DAYS 90 tablet 0   No current facility-administered medications for this visit.    OBJECTIVE: Middle-aged white [name] who Appears stated age 30 Vitals:   10/11/15 1237   BP: 135/97  Pulse: 88  Temp: 98.1 F (36.7 C)  Resp: 18     Body mass index is 24.54 kg/(m^2).    ECOG FS:1 - Symptomatic but completely ambulatory Filed Vitals:   10/11/15 1237  BP: 135/97  Pulse: 88  Temp: 98.1 F (36.7 C)  Resp: 18   Skin: warm, dry  HEENT: sclerae anicteric, conjunctivae pink, oropharynx  clear. No thrush or mucositis.  Lymph Nodes: No cervical or supraclavicular lymphadenopathy  Lungs: clear to auscultation bilaterally, no rales, wheezes, or rhonci  Heart: regular rate and rhythm  Abdomen: round, soft, non tender, positive bowel sounds  Musculoskeletal: No focal spinal tenderness, no peripheral edema  Neuro: non focal, well oriented, agitaed affect  Breast: defererd  LAB RESULTS: CBC    Component Value Date/Time   WBC 13.0* 10/11/2015 1205   WBC 13.2* 10/02/2015 1356   RBC 3.42* 10/11/2015 1205   RBC 2.89* 10/02/2015 1356   HGB 10.8* 10/11/2015 1205   HGB 9.2* 10/02/2015 1356   HCT 33.5* 10/11/2015 1205   HCT 28.1* 10/02/2015 1356   PLT 198 10/11/2015 1205   PLT 137* 10/02/2015 1356   MCV 98.0 10/11/2015 1205   MCV 97.2 10/02/2015 1356   MCH 31.6 10/11/2015 1205   MCH 31.8 10/02/2015 1356   MCHC 32.2 10/11/2015 1205   MCHC 32.7 10/02/2015 1356   RDW 17.5* 10/11/2015 1205   RDW 17.9* 10/02/2015 1356   LYMPHSABS 0.2* 10/11/2015 1205   LYMPHSABS 0.5* 10/02/2015 1356   MONOABS 0.6 10/11/2015 1205   MONOABS 0.5 10/02/2015 1356   EOSABS 0.0 10/11/2015 1205   EOSABS 0.0 10/02/2015 1356   BASOSABS 0.0 10/11/2015 1205   BASOSABS 0.0 10/02/2015 1356      Recent Labs Lab 10/11/15 1205  INR 3.40    Urinalysis    Component Value Date/Time   COLORURINE YELLOW 09/24/2015 [place] 09/24/2015 1455   LABSPEC 1.013 09/24/2015 1455   PHURINE 5.0 09/24/2015 1455   GLUCOSEU NEGATIVE 09/24/2015 1455   HGBUR TRACE* 09/24/2015 [place] 09/24/2015 1455   KETONESUR NEGATIVE 09/24/2015 1455   PROTEINUR NEGATIVE  09/24/2015 1455   UROBILINOGEN 0.2 01/23/2015 1830   NITRITE NEGATIVE 09/24/2015 1455   LEUKOCYTESUR NEGATIVE 09/24/2015 1455    STUDIES: Dg Chest 1 View  09/24/2015  CLINICAL DATA:  Position of PICC. EXAM: CHEST 1 VIEW COMPARISON:  Radiograph of Sep 15, 2015.  CT scan of Sep 20, 2015. FINDINGS: The heart size and mediastinal contours are within normal limits. Interval placement of right-sided PICC line, with distal tip in expected position of cavoatrial junction. No pneumothorax or significant pleural effusion is noted. Right axillary surgical clips are noted. Cavitary lesion is seen in the left upper lobe concerning for pulmonary abscess. Another cavitary lesion is seen in the right upper lobe. The visualized skeletal structures are unremarkable. IMPRESSION: Interval placement of right-sided PICC line, with distal tip in expected position of cavoatrial junction. Bilateral cavitary lesions are noted most consistent with cavitary pneumonia. Electronically Signed   By: Marijo Conception, M.D.   On: 09/24/2015 09:12   Ct Chest Wo Contrast  09/20/2015  CLINICAL DATA:  Pneumonia. Left-sided chest pain. Personal history of breast cancer with bone metastases. EXAM: CT CHEST WITHOUT CONTRAST TECHNIQUE: Multidetector CT imaging of the chest was performed following the standard protocol without IV contrast. COMPARISON:  Chest radiography 09/15/2015.  Chest CT 09/11/2015. FINDINGS: Again demonstrated is benign appearing pleural and parenchymal scarring at both lung apices. There is a background pattern of centrilobular emphysema particularly evident in the upper lobes. Patient has developed multiple widely disseminated cavitary infiltrates. There are 2 adjacent regions of cavitary infiltration in the left upper lobe responsible for the chest radiographic finding. The larger area measures 3.6 cm in diameter. The smaller area measures 2.8 cm in diameter. There is no pleural or pericardial fluid. No enlarged  hilar or  mediastinal lymph nodes are identified. Widespread bony metastatic changes are present as seen previously. Scans in the upper abdomen do not show any acute finding. A region of hazy infiltrate in the right lower lobe with 2 areas of cavitary infiltration is newly developed. The larger region measures 3.5 cm in diameter in the smaller region measures 1.4 cm in diameter. IMPRESSION: Development of multiple areas of cavitary infiltration in both the right and left lung, which have developed rapidly since a CT study of 9 days ago at which time day were not present at all. Lesions developing this rapidly must be inflammatory and could represent spontaneous pulmonary abscesses or could represent abscesses secondary to septic emboli. Certainly, atypical organisms must be considered including fungal disease. Electronically Signed   By: Nelson Chimes M.D.   On: 09/20/2015 20:41   Ir Fluoro Guide Cv Line Right  10/02/2015  INDICATION: History of recurrent metastatic breast cancer. In need of durable intravenous access for chemotherapy administration. EXAM: IMPLANTED PORT A CATH PLACEMENT WITH ULTRASOUND AND FLUOROSCOPIC GUIDANCE COMPARISON:  Chest CT - 09/20/2015 MEDICATIONS: Vancomycin 1 gm IV; The antibiotic was administered within an appropriate time interval prior to skin puncture. ANESTHESIA/SEDATION: Moderate (conscious) sedation was employed during this procedure. A total of Versed 2 mg and Fentanyl 75 mcg was administered intravenously. Moderate Sedation Time: 27 minutes. The patient's level of consciousness and vital signs were monitored continuously by radiology nursing throughout the procedure under my direct supervision. CONTRAST:  None FLUOROSCOPY TIME:  30 seconds (3 mGy) COMPLICATIONS: None immediate. PROCEDURE: The procedure, risks, benefits, and alternatives were explained to the patient. Questions regarding the procedure were encouraged and answered. The patient understands and consents to the procedure.  The right neck and chest were prepped with chlorhexidine in a sterile fashion, and a sterile drape was applied covering the operative field. Maximum barrier sterile technique with sterile gowns and gloves were used for the procedure. A timeout was performed prior to the initiation of the procedure. Local anesthesia was provided with 1% lidocaine with epinephrine. After creating a small venotomy incision, a micropuncture kit was utilized to access the internal jugular vein under direct, real-time ultrasound guidance. Ultrasound image documentation was performed. The microwire was kinked to measure appropriate catheter length. A subcutaneous port pocket was then created along the upper chest wall utilizing a combination of sharp and blunt dissection. The pocket was irrigated with sterile saline. A single lumen thin power injectable port was chosen for placement. The 8 Fr catheter was tunneled from the port pocket site to the venotomy incision. The port was placed in the pocket. The external catheter was trimmed to appropriate length. At the venotomy, an 8 Fr peel-away sheath was placed over a guidewire under fluoroscopic guidance. The catheter was then placed through the sheath and the sheath was removed. Final catheter positioning was confirmed and documented with a fluoroscopic spot radiograph. The port was accessed with a Huber needle, aspirated and flushed. The venotomy site was closed with an interrupted 4-0 Vicryl suture. The port pocket incision was closed with interrupted 2-0 Vicryl suture and the skin was opposed with a running subcuticular 4-0 Vicryl suture. Dermabond and Steri-strips were applied to both incisions. The Port a Catheter was left accessed as requested by the provided clinical team. Dressings were placed. The patient tolerated the procedure well without immediate post procedural complication. FINDINGS: After catheter placement, the tip lies within the superior cavoatrial junction. The catheter  aspirates and flushes normally and is  ready for immediate use. IMPRESSION: Successful placement of a right internal jugular approach power injectable Port-A-Cath. The catheter is ready for immediate use. Electronically Signed   By: Sandi Mariscal M.D.   On: 10/02/2015 09:55   Ir US Guide Vasc Access Right  10/02/2015  INDICATION: History of recurrent metastatic breast cancer. In need of durable intravenous access for chemotherapy administration. EXAM: IMPLANTED PORT A CATH PLACEMENT WITH ULTRASOUND AND FLUOROSCOPIC GUIDANCE COMPARISON:  Chest CT - 09/20/2015 MEDICATIONS: Vancomycin 1 gm IV; The antibiotic was administered within an appropriate time interval prior to skin puncture. ANESTHESIA/SEDATION: Moderate (conscious) sedation was employed during this procedure. A total of Versed 2 mg and Fentanyl 75 mcg was administered intravenously. Moderate Sedation Time: 27 minutes. The patient's level of consciousness and vital signs were monitored continuously by radiology nursing throughout the procedure under my direct supervision. CONTRAST:  None FLUOROSCOPY TIME:  30 seconds (3 mGy) COMPLICATIONS: None immediate. PROCEDURE: The procedure, risks, benefits, and alternatives were explained to the patient. Questions regarding the procedure were encouraged and answered. The patient understands and consents to the procedure. The right neck and chest were prepped with chlorhexidine in a sterile fashion, and a sterile drape was applied covering the operative field. Maximum barrier sterile technique with sterile gowns and gloves were used for the procedure. A timeout was performed prior to the initiation of the procedure. Local anesthesia was provided with 1% lidocaine with epinephrine. After creating a small venotomy incision, a micropuncture kit was utilized to access the internal jugular vein under direct, real-time ultrasound guidance. Ultrasound image documentation was performed. The microwire was kinked to measure  appropriate catheter length. A subcutaneous port pocket was then created along the upper chest wall utilizing a combination of sharp and blunt dissection. The pocket was irrigated with sterile saline. A single lumen thin power injectable port was chosen for placement. The 8 Fr catheter was tunneled from the port pocket site to the venotomy incision. The port was placed in the pocket. The external catheter was trimmed to appropriate length. At the venotomy, an 8 Fr peel-away sheath was placed over a guidewire under fluoroscopic guidance. The catheter was then placed through the sheath and the sheath was removed. Final catheter positioning was confirmed and documented with a fluoroscopic spot radiograph. The port was accessed with a Huber needle, aspirated and flushed. The venotomy site was closed with an interrupted 4-0 Vicryl suture. The port pocket incision was closed with interrupted 2-0 Vicryl suture and the skin was opposed with a running subcuticular 4-0 Vicryl suture. Dermabond and Steri-strips were applied to both incisions. The Port a Catheter was left accessed as requested by the provided clinical team. Dressings were placed. The patient tolerated the procedure well without immediate post procedural complication. FINDINGS: After catheter placement, the tip lies within the superior cavoatrial junction. The catheter aspirates and flushes normally and is ready for immediate use. IMPRESSION: Successful placement of a right internal jugular approach power injectable Port-A-Cath. The catheter is ready for immediate use. Electronically Signed   By: Sandi Mariscal M.D.   On: 10/02/2015 09:55   Dg Chest Port 1 View  09/15/2015  CLINICAL DATA:  Short breath, cough, fever EXAM: PORTABLE CHEST 1 VIEW COMPARISON:  08/31/2015 FINDINGS: Normal cardiac silhouette with ectatic aorta. Increased density in the LEFT upper lobe. The biapical densities are not changed from CT. Small LEFT effusion. No pneumothorax. IMPRESSION: 1.  Increasing density in the LEFT upper lobe. Concerning for edema or infection. Electronically Signed  By: Suzy Bouchard M.D.   On: 09/15/2015 07:26   US Abdomen Limited Ruq  09/15/2015  CLINICAL DATA:  Elevated liver function tests. EXAM: US ABDOMEN LIMITED - RIGHT UPPER QUADRANT COMPARISON:  08/31/2015, PET-CT. FINDINGS: Gallbladder: Surgically absent Common bile duct: Diameter: Dilated to a maximum of 17 mm, unchanged from the recent prior PET-CT. No sonographic evidence of a duct stone. Liver: Cyst in the left liver lobe measuring 13 somewhat mottled appearance of the liver in its more posterior aspect, which could be technique. The possibility of numerous small infiltrating lesion should be considered in the patient's clinical setting. Liver is normal in size and overall echogenicity. IMPRESSION: 1. Chronically dilated common bile duct. Status post cholecystectomy. 2. Mottled appearance of the posterior liver. Consider followup liver MRI with and without contrast. Electronically Signed   By: Lajean Manes M.D.   On: 09/15/2015 10:04    ASSESSMENT: 70 y.o. [name], [name] status post right lumpectomy and axillary lymph node dissection December 2006 for a [place] N2, stage IIIA invasive ductal carcinoma, estrogen and progesterone receptor positive, with subsequent stage IV disease  (1) received adjuvant doxorubicin, docetaxel and cyclophosphamide  (2) status post adjuvant radiation  (3) on tamoxifen to September 2011 when she was noted to have metastatic disease to bone  (4) fulvestrant 02/06/2009 to 01/15/2011  (5) left axillary lymph node positive for an invasive ductal carcinoma, estrogen receptor and HER-2 negative November 2011, and a right breast recurrence for an estrogen receptor positive lesion at the same time.  (6) status post bilateral mastectomies 06/28/2010  (7) status post left chest and axillary radiation  (8) status post right chest wall recurrence October of 2012, treated with  electron beam radiation completed December 2012  (9) progression in bone August 2013,  (10) status post radiation to the lumbosacral spine completed October 2013   (11) capecitabine November 2013 through January 2014  (12) tamoxifen/ everolimus February 2014, discontinued November 2014 with progression  (13) Megace 80 mg twice daily with progression January 2015  (14) Radiation to lumbar spine February 2015  (15) letrozole plus Ibrance started 07/08/2013, discontinued May 2017 with progression  (16). Declines genetic testing (as of 08/17/2013)  (17) zolendronate 05/01/2009, discontinued because of pain; Xgeva started 10/08/2013, discontinued because of cost issues as of November 2015, switched to alendronate March 2016, switched back to [place] but stopped after the May 2016 dose because of side effects, on ibandronate [125 mg/ day 21 days on, 7 days off] temporarily, denosumab to be resumed 10/11/2015  (18) cyclophosphamide, methotrexate, and fluorouracil started 10/11/2015, repeated every 21 days.  (a) s/p Right port placement 10/02/2015  (19) palliative radiation completed 10/02/2015  OTHER ACTIVE PROBLEMS:  (1) chronic GI complaints, followed by Dr. Sydell Axon  (a) treated for c difficile colitis Sept 2016 (stool Ag positive, toxin negative 01/23/2015), received oral vancomycin started 01/24/2015, stopped 02/07/2015  (2) multiple orthopedic problems, including bilateral chronic foot pain, history of rotator cuff repair  (3) history of extensive superficial phlebitis and remote DVT, on chronic low dose Coumadin.  (4) LUL cavitary pneumonia diagnosed as MRSA while at [facility] April 2016, treated with Zosyn x 21 days  (a) recurrent MRSA sepsis/ pneumonia---on IV vancomycin through 10/24/2015--09/26/2015 blood cultures negative  PLAN: [name] has a few complaints today, but none are Carolyn. I am adding valacyclovir to her drug lis to help with her mucositis. The labs were  reviewed in detail and were stable. She will proceed with cycle 1 of CMF as  planned today. According to Dr. Virgie Dad previous dictation, she is also to resume denosumab.   [name] will return in 1 week for labs and a nadir visit. She understands and agrees with this plan. She knows the goal of treatment in her case is control. She has been encouraged to call with any issues that might arise before her next visit here.    Laurie Panda, NP   10/11/2015 2:00 PM

## 2015-10-11 NOTE — Patient Instructions (Signed)
Cyclophosphamide (Cytoxan) injection What is this medicine? CYCLOPHOSPHAMIDE (sye kloe FOSS fa mide) is a chemotherapy drug. It slows the growth of cancer cells. This medicine is used to treat many types of cancer like lymphoma, myeloma, leukemia, breast cancer, and ovarian cancer, to name a few. This medicine may be used for other purposes; ask your health care provider or pharmacist if you have questions. What should I tell my health care provider before I take this medicine? They need to know if you have any of these conditions: -blood disorders -history of other chemotherapy -infection -kidney disease -liver disease -recent or ongoing radiation therapy -tumors in the bone marrow -an unusual or allergic reaction to cyclophosphamide, other chemotherapy, other medicines, foods, dyes, or preservatives -pregnant or trying to get pregnant -breast-feeding How should I use this medicine? This drug is usually given as an injection into a vein or muscle or by infusion into a vein. It is administered in a hospital or clinic by a specially trained health care professional. Talk to your pediatrician regarding the use of this medicine in children. Special care may be needed. Overdosage: If you think you have taken too much of this medicine contact a poison control center or emergency room at once. NOTE: This medicine is only for you. Do not share this medicine with others. What if I miss a dose? It is important not to miss your dose. Call your doctor or health care professional if you are unable to keep an appointment. What may interact with this medicine? This medicine may interact with the following medications: -amiodarone -amphotericin B -azathioprine -certain antiviral medicines for HIV or AIDS such as protease inhibitors (e.g., indinavir, ritonavir) and zidovudine -certain blood pressure medications such as benazepril, captopril, enalapril, fosinopril, lisinopril, moexipril, monopril,  perindopril, quinapril, ramipril, trandolapril -certain cancer medications such as anthracyclines (e.g., daunorubicin, doxorubicin), busulfan, cytarabine, paclitaxel, pentostatin, tamoxifen, trastuzumab -certain diuretics such as chlorothiazide, chlorthalidone, hydrochlorothiazide, indapamide, metolazone -certain medicines that treat or prevent blood clots like warfarin -certain muscle relaxants such as succinylcholine -cyclosporine -etanercept -indomethacin -medicines to increase blood counts like filgrastim, pegfilgrastim, sargramostim -medicines used as general anesthesia -metronidazole -natalizumab This list may not describe all possible interactions. Give your health care provider a list of all the medicines, herbs, non-prescription drugs, or dietary supplements you use. Also tell them if you smoke, drink alcohol, or use illegal drugs. Some items may interact with your medicine. What should I watch for while using this medicine? Visit your doctor for checks on your progress. This drug may make you feel generally unwell. This is not uncommon, as chemotherapy can affect healthy cells as well as cancer cells. Report any side effects. Continue your course of treatment even though you feel ill unless your doctor tells you to stop. Drink water or other fluids as directed. Urinate often, even at night. In some cases, you may be given additional medicines to help with side effects. Follow all directions for their use. Call your doctor or health care professional for advice if you get a fever, chills or sore throat, or other symptoms of a cold or flu. Do not treat yourself. This drug decreases your body's ability to fight infections. Try to avoid being around people who are sick. This medicine may increase your risk to bruise or bleed. Call your doctor or health care professional if you notice any unusual bleeding. Be careful brushing and flossing your teeth or using a toothpick because you may get an  infection or bleed more easily. If you  have any dental work done, tell your dentist you are receiving this medicine. You may get drowsy or dizzy. Do not drive, use machinery, or do anything that needs mental alertness until you know how this medicine affects you. Do not become pregnant while taking this medicine or for 1 year after stopping it. Women should inform their doctor if they wish to become pregnant or think they might be pregnant. Men should not father a child while taking this medicine and for 4 months after stopping it. There is a potential for serious side effects to an unborn child. Talk to your health care professional or pharmacist for more information. Do not breast-feed an infant while taking this medicine. This medicine may interfere with the ability to have a child. This medicine has caused ovarian failure in some women. This medicine has caused reduced sperm counts in some men. You should talk with your doctor or health care professional if you are concerned about your fertility. If you are going to have surgery, tell your doctor or health care professional that you have taken this medicine. What side effects may I notice from receiving this medicine? Side effects that you should report to your doctor or health care professional as soon as possible: -allergic reactions like skin rash, itching or hives, swelling of the face, lips, or tongue -low blood counts - this medicine may decrease the number of white blood cells, red blood cells and platelets. You may be at increased risk for infections and bleeding. -signs of infection - fever or chills, cough, sore throat, pain or difficulty passing urine -signs of decreased platelets or bleeding - bruising, pinpoint red spots on the skin, black, tarry stools, blood in the urine -signs of decreased red blood cells - unusually weak or tired, fainting spells, lightheadedness -breathing problems -dark urine -dizziness -palpitations -swelling  of the ankles, feet, hands -trouble passing urine or change in the amount of urine -weight gain -yellowing of the eyes or skin Side effects that usually do not require medical attention (report to your doctor or health care professional if they continue or are bothersome): -changes in nail or skin color -hair loss -missed menstrual periods -mouth sores -nausea, vomiting This list may not describe all possible side effects. Call your doctor for medical advice about side effects. You may report side effects to FDA at 1-800-FDA-1088. Where should I keep my medicine? This drug is given in a hospital or clinic and will not be stored at home. NOTE: This sheet is a summary. It may not cover all possible information. If you have questions about this medicine, talk to your doctor, pharmacist, or health care provider.    2016, [employer]. (2012-02-28 16:22:58)  Methotrexate injection What is this medicine? METHOTREXATE (METH oh TREX ate) is a chemotherapy drug used to treat cancer including breast cancer, leukemia, and lymphoma. This medicine can also be used to treat psoriasis and certain kinds of arthritis. This medicine may be used for other purposes; ask your health care provider or pharmacist if you have questions. What should I tell my health care provider before I take this medicine? They need to know if you have any of these conditions: -fluid in the stomach area or lungs -if you often drink alcohol -infection or immune system problems -kidney disease -liver disease -low blood counts, like low white cell, platelet, or red cell counts -lung disease -radiation therapy -stomach ulcers -ulcerative colitis -an unusual or allergic reaction to methotrexate, other medicines, foods, dyes, or preservatives -  pregnant or trying to get pregnant -breast-feeding How should I use this medicine? This medicine is for infusion into a vein or for injection into muscle or into the spinal  fluid (whichever applies). It is usually given by a health care professional in a hospital or clinic setting. In rare cases, you might get this medicine at home. You will be taught how to give this medicine. Use exactly as directed. Take your medicine at regular intervals. Do not take your medicine more often than directed. If this medicine is used for arthritis or psoriasis, it should be taken weekly, NOT daily. It is important that you put your used needles and syringes in a special sharps container. Do not put them in a trash can. If you do not have a sharps container, call your pharmacist or healthcare provider to get one. Talk to your pediatrician regarding the use of this medicine in children. While this drug may be prescribed for children as young as 2 years for selected conditions, precautions do apply. Overdosage: If you think you have taken too much of this medicine contact a poison control center or emergency room at once. NOTE: This medicine is only for you. Do not share this medicine with others. What if I miss a dose? It is important not to miss your dose. Call your doctor or health care professional if you are unable to keep an appointment. If you give yourself the medicine and you miss a dose, talk with your doctor or health care professional. Do not take double or extra doses. What may interact with this medicine? This medicine may interact with the following medications: -acitretin -aspirin or aspirin-like medicines including salicylates -azathioprine -certain antibiotics like chloramphenicol, penicillin, tetracycline -certain medicines for stomach problems like esomeprazole, omeprazole, pantoprazole -cyclosporine -gold -hydroxychloroquine -live virus vaccines -mercaptopurine -NSAIDs, medicines for pain and inflammation, like ibuprofen or naproxen -other cytotoxic agents -penicillamine -phenylbutazone -phenytoin -probenacid -retinoids such as isotretinoin and  tretinoin -steroid medicines like prednisone or cortisone -sulfonamides like sulfasalazine and trimethoprim/sulfamethoxazole -theophylline This list may not describe all possible interactions. Give your health care provider a list of all the medicines, herbs, non-prescription drugs, or dietary supplements you use. Also tell them if you smoke, drink alcohol, or use illegal drugs. Some items may interact with your medicine. What should I watch for while using this medicine? Avoid alcoholic drinks. In some cases, you may be given additional medicines to help with side effects. Follow all directions for their use. This medicine can make you more sensitive to the sun. Keep out of the sun. If you cannot avoid being in the sun, wear protective clothing and use sunscreen. Do not use sun lamps or tanning beds/booths. You may get drowsy or dizzy. Do not drive, use machinery, or do anything that needs mental alertness until you know how this medicine affects you. Do not stand or sit up quickly, especially if you are an older patient. This reduces the risk of dizzy or fainting spells. You may need blood work done while you are taking this medicine. Call your doctor or health care professional for advice if you get a fever, chills or sore throat, or other symptoms of a cold or flu. Do not treat yourself. This drug decreases your body's ability to fight infections. Try to avoid being around people who are sick. This medicine may increase your risk to bruise or bleed. Call your doctor or health care professional if you notice any unusual bleeding. Check with your doctor or health care  professional if you get an attack of severe diarrhea, nausea and vomiting, or if you sweat a lot. The loss of too much body fluid can make it dangerous for you to take this medicine. Talk to your doctor about your risk of cancer. You may be more at risk for certain types of cancers if you take this medicine. Both men and women must use  effective birth control with this medicine. Do not become pregnant while taking this medicine or until at least 1 normal menstrual cycle has occurred after stopping it. Women should inform their doctor if they wish to become pregnant or think they might be pregnant. Men should not father a child while taking this medicine and for 3 months after stopping it. There is a potential for serious side effects to an unborn child. Talk to your health care professional or pharmacist for more information. Do not breast-feed an infant while taking this medicine. What side effects may I notice from receiving this medicine? Side effects that you should report to your doctor or health care professional as soon as possible: -allergic reactions like skin rash, itching or hives, swelling of the face, lips, or tongue -back pain -breathing problems or shortness of breath -confusion -diarrhea -dry, nonproductive cough -low blood counts - this medicine may decrease the number of white blood cells, red blood cells and platelets. You may be at increased risk of infections and bleeding -mouth sores -redness, blistering, peeling or loosening of the skin, including inside the mouth -seizures -severe headaches -signs of infection - fever or chills, cough, sore throat, pain or difficulty passing urine -signs and symptoms of bleeding such as bloody or black, tarry stools; red or dark-brown urine; spitting up blood or brown material that looks like coffee grounds; red spots on the skin; unusual bruising or bleeding from the eye, gums, or nose -signs and symptoms of kidney injury like trouble passing urine or change in the amount of urine -signs and symptoms of liver injury like dark yellow or brown urine; general ill feeling or flu-like symptoms; light-colored stools; loss of appetite; nausea; right upper belly pain; unusually weak or tired; yellowing of the eyes or skin -stiff neck -vomiting Side effects that usually do not  require medical attention (report to your doctor or health care professional if they continue or are bothersome): -dizziness -hair loss -headache -stomach pain -upset stomach This list may not describe all possible side effects. Call your doctor for medical advice about side effects. You may report side effects to FDA at 1-800-FDA-1088. Where should I keep my medicine? If you are using this medicine at home, you will be instructed on how to store this medicine. Throw away any unused medicine after the expiration date on the label. NOTE: This sheet is a summary. It may not cover all possible information. If you have questions about this medicine, talk to your doctor, pharmacist, or health care provider.    2016, [employer]. (2014-08-04 12:36:41)  Fluorouracil, 5-FU injection What is this medicine? FLUOROURACIL, 5-FU (flure oh YOOR a sil) is a chemotherapy drug. It slows the growth of cancer cells. This medicine is used to treat many types of cancer like breast cancer, colon or rectal cancer, pancreatic cancer, and stomach cancer. This medicine may be used for other purposes; ask your health care provider or pharmacist if you have questions. What should I tell my health care provider before I take this medicine? They need to know if you have any of these conditions: -blood  disorders -dihydropyrimidine dehydrogenase (DPD) deficiency -infection (especially a virus infection such as chickenpox, cold sores, or herpes) -kidney disease -liver disease -malnourished, poor nutrition -recent or ongoing radiation therapy -an unusual or allergic reaction to fluorouracil, other chemotherapy, other medicines, foods, dyes, or preservatives -pregnant or trying to get pregnant -breast-feeding How should I use this medicine? This drug is given as an infusion or injection into a vein. It is administered in a hospital or clinic by a specially trained health care professional. Talk to your  pediatrician regarding the use of this medicine in children. Special care may be needed. Overdosage: If you think you have taken too much of this medicine contact a poison control center or emergency room at once. NOTE: This medicine is only for you. Do not share this medicine with others. What if I miss a dose? It is important not to miss your dose. Call your doctor or health care professional if you are unable to keep an appointment. What may interact with this medicine? -allopurinol -cimetidine -dapsone -digoxin -hydroxyurea -leucovorin -levamisole -medicines for seizures like ethotoin, fosphenytoin, phenytoin -medicines to increase blood counts like filgrastim, pegfilgrastim, sargramostim -medicines that treat or prevent blood clots like warfarin, enoxaparin, and dalteparin -methotrexate -metronidazole -pyrimethamine -some other chemotherapy drugs like busulfan, cisplatin, estramustine, vinblastine -trimethoprim -trimetrexate -vaccines Talk to your doctor or health care professional before taking any of these medicines: -acetaminophen -aspirin -ibuprofen -ketoprofen -naproxen This list may not describe all possible interactions. Give your health care provider a list of all the medicines, herbs, non-prescription drugs, or dietary supplements you use. Also tell them if you smoke, drink alcohol, or use illegal drugs. Some items may interact with your medicine. What should I watch for while using this medicine? Visit your doctor for checks on your progress. This drug may make you feel generally unwell. This is not uncommon, as chemotherapy can affect healthy cells as well as cancer cells. Report any side effects. Continue your course of treatment even though you feel ill unless your doctor tells you to stop. In some cases, you may be given additional medicines to help with side effects. Follow all directions for their use. Call your doctor or health care professional for advice if  you get a fever, chills or sore throat, or other symptoms of a cold or flu. Do not treat yourself. This drug decreases your body's ability to fight infections. Try to avoid being around people who are sick. This medicine may increase your risk to bruise or bleed. Call your doctor or health care professional if you notice any unusual bleeding. Be careful brushing and flossing your teeth or using a toothpick because you may get an infection or bleed more easily. If you have any dental work done, tell your dentist you are receiving this medicine. Avoid taking products that contain aspirin, acetaminophen, ibuprofen, naproxen, or ketoprofen unless instructed by your doctor. These medicines may hide a fever. Do not become pregnant while taking this medicine. Women should inform their doctor if they wish to become pregnant or think they might be pregnant. There is a potential for serious side effects to an unborn child. Talk to your health care professional or pharmacist for more information. Do not breast-feed an infant while taking this medicine. Men should inform their doctor if they wish to father a child. This medicine may lower sperm counts. Do not treat diarrhea with over the counter products. Contact your doctor if you have diarrhea that lasts more than 2 days or if it is  severe and watery. This medicine can make you more sensitive to the sun. Keep out of the sun. If you cannot avoid being in the sun, wear protective clothing and use sunscreen. Do not use sun lamps or tanning beds/booths. What side effects may I notice from receiving this medicine? Side effects that you should report to your doctor or health care professional as soon as possible: -allergic reactions like skin rash, itching or hives, swelling of the face, lips, or tongue -low blood counts - this medicine may decrease the number of white blood cells, red blood cells and platelets. You may be at increased risk for infections and  bleeding. -signs of infection - fever or chills, cough, sore throat, pain or difficulty passing urine -signs of decreased platelets or bleeding - bruising, pinpoint red spots on the skin, black, tarry stools, blood in the urine -signs of decreased red blood cells - unusually weak or tired, fainting spells, lightheadedness -breathing problems -changes in vision -chest pain -mouth sores -nausea and vomiting -pain, swelling, redness at site where injected -pain, tingling, numbness in the hands or feet -redness, swelling, or sores on hands or feet -stomach pain -unusual bleeding Side effects that usually do not require medical attention (report to your doctor or health care professional if they continue or are bothersome): -changes in finger or toe nails -diarrhea -dry or itchy skin -hair loss -headache -loss of appetite -sensitivity of eyes to the light -stomach upset -unusually teary eyes This list may not describe all possible side effects. Call your doctor for medical advice about side effects. You may report side effects to FDA at 1-800-FDA-1088. Where should I keep my medicine? This drug is given in a hospital or clinic and will not be stored at home. NOTE: This sheet is a summary. It may not cover all possible information. If you have questions about this medicine, talk to your doctor, pharmacist, or health care provider.    2016, [employer]. (2007-08-19 13:53:16)

## 2015-10-11 NOTE — Telephone Encounter (Signed)
appt made and avs to print in treatment room °

## 2015-10-12 LAB — CANCER ANTIGEN 27.29: CA 27.29: 162.3 U/mL — ABNORMAL HIGH (ref 0.0–38.6)

## 2015-10-12 NOTE — Progress Notes (Signed)
  Radiation Oncology         [phone] ________________________________  Name: [name] MRN: [id number]  Date: 10/02/2015  DOB: [birth year]  End of Treatment Note   ICD-9-CM ICD-10-CM    1. Breast cancer metastasized to bone, unspecified laterality (HCC) 174.9 C50.919    198.5 C79.51     DIAGNOSIS: Metastatic breast cancer with diffuse osseous metastasis     Indication for treatment:  Palliative        Radiation treatment dates:   09/26/2015-10/02/2015  Site/dose:    1) Right Tibia treated to 20 Gy in 5 fractions 2) Left Rib cage treated to 20 Gy in 5 fractions  3) Frontal Skull treated to 20 Gy in 5 fractions    Beams/energy:    1) Isodose Plan / 15X, 6X 2) 3D / 15X  3) 3D/ 6X   Narrative: The patient tolerated radiation treatment relatively well.  During treatment she had some pain in the right tibial area and frontal skull. Some erythema in the frontal skull area as well. Her pain improved in the frontal skull and right tibia area.  Plan: The patient has completed radiation treatment. The patient will return to radiation oncology clinic for routine followup in one month. I advised them to call or return sooner if they have any questions or concerns related to their recovery or treatment.  -----------------------------------  Blair Promise, PhD, MD  This document serves as a record of services personally performed by Gery Pray, MD. It was created on his behalf by Derek Mound, a trained medical scribe. The creation of this record is based on the scribe's personal observations and the provider's statements to them. This document has been checked and approved by the attending provider.

## 2015-10-13 LAB — CANCER ANTIGEN 27-29 (PARALLEL TESTING): CA 27.29: 154 U/mL — ABNORMAL HIGH (ref <38)

## 2015-10-17 ENCOUNTER — Telehealth: Payer: Self-pay | Admitting: *Deleted

## 2015-10-17 NOTE — Telephone Encounter (Signed)
This RN spoke with MD and Juliann Pulse at facility- Dr Jannifer Rodney is ok with current dose of calcium prescribed by covering MD of  1200mg  of calcium Bid.  Plan is to obtain a CMET on visit day and give additional calcium if needed.

## 2015-10-17 NOTE — Telephone Encounter (Signed)
Received call from [facility] @ [facility] where pt is getting therapy & IV ATB.  She has an appt 10/19/15 with Dr [name] & pt has a calcium of 6.4 & facility MD wants her to have calcium gluconate & they want to know if Dr [name] is in agreement & can it be done at our facility while she is here.  Opal Sidles can be reached at [phone]. Message to Dr Magrinat/Pod RN

## 2015-10-18 ENCOUNTER — Encounter: Payer: Self-pay | Admitting: Internal Medicine

## 2015-10-18 ENCOUNTER — Ambulatory Visit (INDEPENDENT_AMBULATORY_CARE_PROVIDER_SITE_OTHER): Payer: Medicare PPO | Admitting: Internal Medicine

## 2015-10-18 VITALS — BP 116/78 | HR 77 | Temp 97.8°F | Wt 131.0 lb

## 2015-10-18 DIAGNOSIS — R7881 Bacteremia: Secondary | ICD-10-CM

## 2015-10-18 DIAGNOSIS — B9562 Methicillin resistant Staphylococcus aureus infection as the cause of diseases classified elsewhere: Secondary | ICD-10-CM | POA: Diagnosis not present

## 2015-10-18 MED ORDER — VANCOMYCIN HCL IN DEXTROSE 1-5 GM/200ML-% IV SOLN: 1000.0000 mg | INTRAVENOUS | Status: AC

## 2015-10-18 NOTE — Progress Notes (Signed)
[facility] for Infectious Disease  Patient Active Problem List   Diagnosis Date Noted  . Cavitary pneumonia     Priority: High  . MRSA bacteremia     Priority: High  . Breast cancer metastasized to bone ([place]) 04/30/2011    Priority: Medium  . Anemia of chronic disease 10/10/2015  . Necrotizing pneumonia ([place])   . PICC line infection   . Goals of care, counseling/discussion   . Acute septic pulmonary embolism ([place]) 09/24/2015  . Acute respiratory failure with hypoxia ([place]) 09/24/2015  . Leukocytosis 09/24/2015  . Bacteremia   . Rib pain on left side   . Staphylococcus aureus bacteremia with sepsis ([place])   . Neoplasm related pain   . Encounter for palliative care   . Generalized abdominal pain   . Elevated LFTs   . SIRS (systemic inflammatory response syndrome) ([place]) 09/14/2015  . Pain from bone metastases ([place]) 06/28/2015  . Cancer of overlapping sites of right breast ([place]) 05/11/2015  . Bilateral breast cancer ([place]) 05/11/2015  . C. difficile colitis 02/08/2015  . Protein-calorie malnutrition, severe ([place]) 01/25/2015  . Clostridium difficile diarrhea   . Hypokalemia 01/24/2015  . AKI (acute kidney injury) ([place]) 01/24/2015  . Essential hypertension 01/24/2015  . Abdominal pain   . Dehydration 09/09/2014  . HCAP (healthcare-associated pneumonia) 08/09/2014  . Anorexia 03/02/2013  . Weight loss due to medication 03/02/2013  . Swollen R ankle 08/10/2012  . Fracture of ankle, medial malleolus, right, closed 08/10/2012  . Rotator cuff tear 09/24/2011  . Rash 03/01/2011  . Seroma, post-traumatic 12/25/2010  . RECTAL BLEEDING 12/08/2009  . FLATULENCE 12/08/2009  . OTH NONSPC ABN FINDNG RAD&OTH EXM BODY STRUCTURE 01/10/2009  . GERD 12/26/2008  . Osteoarthritis 12/26/2008  . Nausea without vomiting 12/26/2008  . Diarrhea 12/26/2008  . Personal history of thrombophlebitis 12/26/2008  . COLONIC POLYPS, HX OF 12/26/2008    Patient's Medications  New  Prescriptions   No medications on file  Previous Medications   DEXAMETHASONE (DECADRON) 2 MG TABLET    Take 1 tablet (2 mg total) by mouth every 12 (twelve) hours.   DEXAMETHASONE (DECADRON) 4 MG TABLET    Take 2 tablets (8 mg total) by mouth daily. Start the day after chemotherapy for 2 days. Take with food.   FENTANYL (DURAGESIC - DOSED MCG/HR) 50 MCG/HR    Place 1 patch (50 mcg total) onto the skin every 3 (three) days.   FUROSEMIDE (LASIX PO)    Take by mouth.   GABAPENTIN (NEURONTIN) 100 MG CAPSULE    Take 1 capsule (100 mg total) by mouth at bedtime.   LIDOCAINE (LIDODERM) 5 %    Place 1 patch onto the skin daily. Remove & Discard patch within 12 hours or as directed by MD   LIDOCAINE-PRILOCAINE (EMLA) CREAM    Apply to affected area once   MAGIC MOUTHWASH W/LIDOCAINE SOLN    Take 10 mLs by mouth 3 (three) times daily as needed for mouth pain.   MAGNESIUM OXIDE (MAG-OX) 400 (241.3 MG) MG TABLET    Take 1 tablet (400 mg total) by mouth daily.   MORPHINE (MSIR) 15 MG TABLET    Take 1 tablet (15 mg total) by mouth every 4 (four) hours as needed for severe pain. Once every 12 hours as needed   ONDANSETRON (ZOFRAN-ODT) 8 MG DISINTEGRATING TABLET    DISSOLVE 1 TABLET ON THE TONGUE EVERY 8 HOURS AS NEEDED FOR NAUSEA  PANTOPRAZOLE (PROTONIX) 40 MG TABLET    Take 1 tablet (40 mg total) by mouth daily.   POLYETHYL GLYCOL-PROPYL GLYCOL (SYSTANE OP)    Apply 1 drop to eye daily as needed (dry eyes).   POTASSIUM PO    Take by mouth.   PROCHLORPERAZINE (COMPAZINE) 10 MG TABLET    Take 1 tablet (10 mg total) by mouth every 6 (six) hours as needed (Nausea or vomiting).   VALACYCLOVIR (VALTREX) 500 MG TABLET    Take 1 tablet (500 mg total) by mouth daily.   WARFARIN (COUMADIN) 4 MG TABLET    Take 1 tablet (4 mg total) by mouth daily.   WARFARIN (COUMADIN) 4 MG TABLET    TAKE 1 TABLET BY MOUTH ON MONDAYS AND FRIDAYS, AND TAKE 1/2 TABLET BY MOUTH ON ALL OTHER DAYS  Modified Medications   Modified  Medication Previous Medication   VANCOMYCIN (VANCOCIN) 1-5 GM/200ML-% SOLN vancomycin (VANCOCIN) 1-5 GM/200ML-% SOLN      Inject 200 mLs (1,000 mg total) into the vein daily.    Inject 200 mLs (1,000 mg total) into the vein daily.  Discontinued Medications   FEEDING SUPPLEMENT (BOOST / RESOURCE BREEZE) LIQD    Take 1 Container by mouth 2 (two) times daily between meals.    Subjective: [name] is in for her hospital follow-up visit with her daughter. She has metastatic breast cancer and was hospitalized 2 times recently with persistent MRSA bacteremia. During her first hospitalization transesophageal echocardiogram was obtained that did not show any evidence of endocarditis. She was discharged to a nursing facility but did not like the care she was receiving and was readmitted one day later. Upon readmission her blood cultures were positive again and her chest x-ray showed evidence of progressive cavitary ammonia. With continued vancomycin therapy her blood cultures did become negative again. She is now completed 34 days of total vancomycin therapy. She denies having any problems tolerating her new Port-A-Cath or vancomycin. She is complaining of some pain on the roof of her mouth. She states that Magic mouthwash does not help.  Review of Systems: Review of Systems  Constitutional: Positive for fever. Negative for chills and diaphoresis.       She states that she feels like she may have been having fever for the past 24 hours. She is not sure of anybody at her nursing facility has taken her temperature.  HENT:       Mouth pain as noted in history of present illness.  Respiratory: Positive for cough and shortness of breath. Negative for sputum production.   Cardiovascular: Positive for chest pain.  Gastrointestinal: Negative for nausea, vomiting, abdominal pain and diarrhea.  Neurological: Positive for weakness.    Past Medical History  Diagnosis Date  . Cancer (HCC)     RBreast, Lower  back , Under l Arm  . Hypertension   . GERD (gastroesophageal reflux disease)   . Breast cancer metastasized to bone ([place]) 04/30/2011  . Radiation 01/15/2012    Lumbosacral spine 3500 cGy 14  fx  . Swollen R ankle 08/10/2012  . Fracture of ankle, medial malleolus, right, closed 08/10/2012  . Tubular adenoma 2011  . Hiatal hernia 2011  . Schatzki's ring   . Anorexia 03/02/2013  . Weight loss due to medication 03/02/2013  . C. difficile colitis   . Pneumonia   . Metastasis ([place])     to skull    Social History  Substance Use Topics  . Smoking status: Former Smoker  Quit date: 11/22/1982  . Smokeless tobacco: Never Used     Comment: Quit smoking x 32 years  . Alcohol Use: No    Family History  Problem Relation Age of Onset  . Cancer Mother     Colon  . Cancer Father     Lung, Mouth,leg    Allergies  Allergen Reactions  . Aspirin Nausea And Vomiting  . Boost Pudding [Nutritional Supplements] Diarrhea  . Percodan [Oxycodone-Aspirin] Nausea Only  . Codeine Nausea Only    Objective: Filed Vitals:   10/18/15 0943  BP: 116/78  Pulse: 77  Weight: 131 lb (59.421 kg)  SpO2: 93%   Body mass index is 23.95 kg/(m^2).  Physical Exam  HENT:  Mouth/Throat: No oropharyngeal exudate.  Cardiovascular: Normal rate and regular rhythm.   No murmur heard. Pulmonary/Chest: Effort normal and breath sounds normal. She has no wheezes. She has no rales.  Port-A-Cath site looks good.    Lab Results    Problem List Items Addressed This Visit      High   MRSA bacteremia - Primary    I talked to [name] and her daughter and encouraged him to make sure that her temperature is taken on a regular basis. I asked him to call me if she is having a temperature of 101 or greater. If she is afebrile I will go ahead with my plan to complete 6 weeks of therapy on 10/04/2015. I would like her to follow-up with me in one month. It may be worth trying discus Xylocaine for her mouth pain.           Michel Bickers, MD [facility] [place] [facility] [phone] pager   [phone] cell 10/18/2015, 10:30 AM

## 2015-10-18 NOTE — Assessment & Plan Note (Signed)
I talked to [name] and her daughter and encouraged him to make sure that her temperature is taken on a regular basis. I asked him to call me if she is having a temperature of 101 or greater. If she is afebrile I will go ahead with my plan to complete 6 weeks of therapy on 10/04/2015. I would like her to follow-up with me in one month. It may be worth trying discus Xylocaine for her mouth pain.

## 2015-10-19 ENCOUNTER — Other Ambulatory Visit (HOSPITAL_BASED_OUTPATIENT_CLINIC_OR_DEPARTMENT_OTHER): Payer: Medicare PPO

## 2015-10-19 ENCOUNTER — Ambulatory Visit (HOSPITAL_BASED_OUTPATIENT_CLINIC_OR_DEPARTMENT_OTHER): Payer: Medicare PPO

## 2015-10-19 ENCOUNTER — Other Ambulatory Visit: Payer: Self-pay | Admitting: *Deleted

## 2015-10-19 ENCOUNTER — Other Ambulatory Visit: Payer: Self-pay | Admitting: Oncology

## 2015-10-19 ENCOUNTER — Encounter: Payer: Self-pay | Admitting: Internal Medicine

## 2015-10-19 ENCOUNTER — Other Ambulatory Visit (HOSPITAL_COMMUNITY)
Admission: RE | Admit: 2015-10-19 | Discharge: 2015-10-19 | Disposition: A | Discharge status: Home / Self Care | Payer: Medicare PPO | Source: Ambulatory Visit | Attending: Oncology | Admitting: Oncology

## 2015-10-19 ENCOUNTER — Ambulatory Visit (HOSPITAL_BASED_OUTPATIENT_CLINIC_OR_DEPARTMENT_OTHER): Payer: Medicare PPO | Admitting: Oncology

## 2015-10-19 ENCOUNTER — Ambulatory Visit: Payer: Medicare PPO

## 2015-10-19 VITALS — BP 85/66 | HR 99 | Temp 98.1°F | Resp 18 | Ht 62.0 in | Wt 131.3 lb

## 2015-10-19 DIAGNOSIS — C44501 Unspecified malignant neoplasm of skin of breast: Secondary | ICD-10-CM | POA: Diagnosis not present

## 2015-10-19 DIAGNOSIS — Z95828 Presence of other vascular implants and grafts: Secondary | ICD-10-CM

## 2015-10-19 DIAGNOSIS — C773 Secondary and unspecified malignant neoplasm of axilla and upper limb lymph nodes: Secondary | ICD-10-CM | POA: Diagnosis not present

## 2015-10-19 DIAGNOSIS — I890 Lymphedema, not elsewhere classified: Secondary | ICD-10-CM

## 2015-10-19 DIAGNOSIS — C50919 Malignant neoplasm of unspecified site of unspecified female breast: Secondary | ICD-10-CM

## 2015-10-19 DIAGNOSIS — R0602 Shortness of breath: Secondary | ICD-10-CM

## 2015-10-19 DIAGNOSIS — K1230 Oral mucositis (ulcerative), unspecified: Secondary | ICD-10-CM

## 2015-10-19 DIAGNOSIS — J984 Other disorders of lung: Secondary | ICD-10-CM

## 2015-10-19 DIAGNOSIS — D701 Agranulocytosis secondary to cancer chemotherapy: Secondary | ICD-10-CM | POA: Diagnosis not present

## 2015-10-19 DIAGNOSIS — Z86711 Personal history of pulmonary embolism: Secondary | ICD-10-CM

## 2015-10-19 DIAGNOSIS — C50911 Malignant neoplasm of unspecified site of right female breast: Secondary | ICD-10-CM

## 2015-10-19 DIAGNOSIS — C50811 Malignant neoplasm of overlapping sites of right female breast: Secondary | ICD-10-CM

## 2015-10-19 DIAGNOSIS — C7951 Secondary malignant neoplasm of bone: Secondary | ICD-10-CM

## 2015-10-19 DIAGNOSIS — M545 Low back pain: Secondary | ICD-10-CM | POA: Diagnosis not present

## 2015-10-19 DIAGNOSIS — A4102 Sepsis due to Methicillin resistant Staphylococcus aureus: Secondary | ICD-10-CM

## 2015-10-19 DIAGNOSIS — Z8672 Personal history of thrombophlebitis: Secondary | ICD-10-CM

## 2015-10-19 LAB — CBC WITH DIFFERENTIAL/PLATELET
BASO%: 0 % (ref 0.0–2.0)
Basophils Absolute: 0 10*3/uL (ref 0.0–0.1)
EOS ABS: 0 10*3/uL (ref 0.0–0.5)
EOS%: 2 % (ref 0.0–7.0)
HCT: 29.3 % — ABNORMAL LOW (ref 34.8–46.6)
HGB: 9.9 g/dL — ABNORMAL LOW (ref 11.6–15.9)
LYMPH%: 6.1 % — AB (ref 14.0–49.7)
MCH: 31 pg (ref 25.1–34.0)
MCHC: 33.8 g/dL (ref 31.5–36.0)
MCV: 91.8 fL (ref 79.5–101.0)
MONO#: 0 10*3/uL — ABNORMAL LOW (ref 0.1–0.9)
MONO%: 0 % (ref 0.0–14.0)
NEUT#: 0.5 10*3/uL — CL (ref 1.5–6.5)
NEUT%: 91.9 % — ABNORMAL HIGH (ref 38.4–76.8)
PLATELETS: 47 10*3/uL — AB (ref 145–400)
RBC: 3.19 10*6/uL — AB (ref 3.70–5.45)
RDW: 16.4 % — ABNORMAL HIGH (ref 11.2–14.5)
WBC: 0.5 10*3/uL — AB (ref 3.9–10.3)
lymph#: 0 10*3/uL — ABNORMAL LOW (ref 0.9–3.3)
nRBC: 0 % (ref 0–0)

## 2015-10-19 LAB — PROTIME-INR
INR: 3.89 — AB (ref 0.00–1.49)
Prothrombin Time: 37.2 seconds — ABNORMAL HIGH (ref 11.6–15.2)

## 2015-10-19 LAB — TECHNOLOGIST REVIEW

## 2015-10-19 LAB — COMPREHENSIVE METABOLIC PANEL
ALT: 24 U/L (ref 0–55)
ANION GAP: 8 meq/L (ref 3–11)
AST: 26 U/L (ref 5–34)
Albumin: 2.4 g/dL — ABNORMAL LOW (ref 3.5–5.0)
Alkaline Phosphatase: 147 U/L (ref 40–150)
BUN: 33.5 mg/dL — ABNORMAL HIGH (ref 7.0–26.0)
CHLORIDE: 94 meq/L — AB (ref 98–109)
CO2: 32 meq/L — AB (ref 22–29)
Calcium: 7.5 mg/dL — ABNORMAL LOW (ref 8.4–10.4)
Creatinine: 0.8 mg/dL (ref 0.6–1.1)
EGFR: 73 mL/min/{1.73_m2} — AB (ref >90)
Glucose: 92 mg/dl (ref 70–140)
Potassium: 3.6 mEq/L (ref 3.5–5.1)
Sodium: 134 mEq/L — ABNORMAL LOW (ref 136–145)
Total Bilirubin: 0.85 mg/dL (ref 0.20–1.20)
Total Protein: 6.1 g/dL — ABNORMAL LOW (ref 6.4–8.3)

## 2015-10-19 MED ORDER — HYDROMORPHONE HCL 4 MG/ML IJ SOLN
INTRAMUSCULAR | Status: AC
  Filled 2015-10-19: qty 1

## 2015-10-19 MED ORDER — HEPARIN SOD (PORK) LOCK FLUSH 100 UNIT/ML IV SOLN
500.0000 [IU] | Freq: Once | INTRAVENOUS | Status: AC
  Administered 2015-10-19: 500 [IU] via INTRAVENOUS
  Filled 2015-10-19: qty 5

## 2015-10-19 MED ORDER — HYDROMORPHONE HCL 4 MG/ML IJ SOLN
1.0000 mg | Freq: Once | INTRAMUSCULAR | Status: DC
  Administered 2015-10-19: 1 mg via INTRAVENOUS

## 2015-10-19 MED ORDER — SODIUM CHLORIDE 0.9% FLUSH
10.0000 mL | INTRAVENOUS | Status: DC | PRN
  Administered 2015-10-19: 10 mL via INTRAVENOUS
  Filled 2015-10-19: qty 10

## 2015-10-19 MED ORDER — SODIUM CHLORIDE 0.9 % IV SOLN
INTRAVENOUS | Status: DC
  Administered 2015-10-19: 16:00:00 via INTRAVENOUS

## 2015-10-19 NOTE — Progress Notes (Signed)
[name]  [phone] [phone]     ID: [name] OB: 07-01-1945  MR#: [id number]  [name]#:[id number]  PCP: Carolyn Labrum, MD GYN:  [name]  SU:  OTHER MD: [name], [name]  CHIEF COMPLAINT: stage IV breast cancer  CURRENT TREATMENT: CMF; denosumab  BREAST CANCER HISTORY: From [name] summary 07/14/2013::  "Complex 70 year old woman with history of ER-positive and ER negative metachronous primary bilateral breast cancers.  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 Progression in bone while on tamoxifen September 2011. Initial complete response to monthly Faslodex injections.She developed a malignant left axillary lymph node with no clear primary in the left breast November 2011. This tumor was ER negative. HER-2 negative. While under evaluation for this lesion she developed a new lesion in the right breast which was ER positive. She underwent bilateral mastectomies 06/28/2010. She was continued on hormonal therapy with monthly Faslodex injections in view of the ER positive component of her tumor with a complete response to this hormone in bone. She underwent radiation to the left chest and axilla subsequent to the mastectomies. She developed a cutaneous recurrence on the right chest wall along the medial aspect of the right mastectomy scar, biopsy proven, initially detected on clinical exam by her radiation oncologist on 02/14/2011. She was treated with electron beam radiation.. 3000 cGy in 15 fractions through December 2012. Further progression in bone in August 2013. Palliative radiation to lumbosacral spine September through October 2013. Brief trial of Xeloda November 2013 through January 2014. Change to tamoxifen plus  [employer] February 2014 stopped for progression and 25 pound weight loss November 2014. Trial of Megace 80 mg twice a day with improved appetite but progressive rise in tumor marker and progressive low back pain with further progression on MRI of the spine done January 2015. Radiation to the lumbar spine February 2015. She started most recent salvage regimen with a combination of Ibrance plus Femara on 07/08/2013. It is too early to assess response."  The patient's breast cancer history is summarized in detailed below   INTERVAL HISTORY: [name] returns today for follow up of her metastatic breast cancer accompanied by her friend [name]. Today is day 9, cycle 1 of CMF which [place] is to receive every 21 days.. She also resumed denosumab 10/11/2015, to be repeated every 6 weeks.  She is currently residing  AT [facility]. She tells me she gets up occupational and physical therapy every day. She tells me she dresses herself on watches her cell phone uses a walker to get to activities. She continues to receive vancomycin daily under the direction of [name]. He is tolerating that with no side effects that she is aware of.  REVIEW OF SYSTEMS: [name] is not aware of any acute side effects from her first cycle of CMF chemotherapy she has lost quite a bit of hair. She did not have significant nausea problems. She is having some mouth sores. She has not had intercurrent fever. Her INRs have been very high, possibly because of her antibiotics,but she has had no bleeding problems. She tells me she is constipated and has a firm bowel movement about every 4 days.Her pain is generally well-controlled on her currentmedications which includeDuragesic and dilatedarea a review of systems today is otherwise diffusely positive.She complains of insomnia, fatigue, runny nose, difficulty  swallowing, shortness of breath, cough, poor appetite,urinary incontinence, psoriasis,easy bruising, headaches,  forgetfulness, and hot flashes. She tells me her port is working well.  PAST MEDICAL HISTORY: Past Medical History  Diagnosis Date  . Cancer (HCC)     RBreast, Lower back , Under l Arm  . Hypertension   . GERD (gastroesophageal reflux disease)   . Breast cancer metastasized to bone ([place]) 04/30/2011  . Radiation 01/15/2012    Lumbosacral spine 3500 cGy 14  fx  . Swollen R ankle 08/10/2012  . Fracture of ankle, medial malleolus, right, closed 08/10/2012  . Tubular adenoma 2011  . Hiatal hernia 2011  . Schatzki's ring   . Anorexia 03/02/2013  . Weight loss due to medication 03/02/2013  . C. difficile colitis   . Pneumonia   . 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 Gallbladder surgery  1991  . Mastectomy  06/28/10    bilateral- Dr. Rosebud Poles, [place]  . Inguinal hernia repair  90's    right  . Parathyroidectomy  90's  . Carpal tunnel release  90's  . Oophorectomy  90's    right  . Lymph node dissection    . Breast surgery    . Cholecystectomy    . Colonoscopy  01/08/10    Dr. [name]- anal tag,hemorrhoid o/w normal rectum, pancolonic diverticula, diminutive polyp in the base of the cecum= tubular adenoma on bx. poor prep  . Esophagogastroduodenoscopy  01/08/10    Dr. [name]- normal esophagus, small hiatal hernia, antum and body erosions, pedunculated polyp between D1 and D2, duodenal diverticulum, lymphangiectasia, second portion of the duodenum.stomach bx= inflammation, duodenum bx= adenoma  . Rotator cuff repair  08/27/11    Dr. Alphonzo Cruise- [facility]- [place] [facility]  . Esophagogastroduodenoscopy (egd) with propofol N/A 11/19/2012    RMR: non-critical Schatzki's ring s/p dilation with 60 F, multiple gastric polyps, duodenal polyp s/p piecemeal snare polypectomy, gastric polypectomy, path benign  . Maloney dilation N/A 11/19/2012    Procedure: Venia Minks DILATION;  Surgeon: Daneil Dolin, MD;  Location: [facility];  Service:  Endoscopy;  Laterality: N/A;  #[id number]  . Polypectomy N/A 11/19/2012    Procedure: POLYPECTOMY;  Surgeon: Daneil Dolin, MD;  Location: [facility];  Service: Endoscopy;  Laterality: N/A;  duodenal  . Biopsy N/A 11/19/2012    Procedure: BIOPSY;  Surgeon: Daneil Dolin, MD;  Location: [facility];  Service: Endoscopy;  Laterality: N/A;  esophageal  . Tee without cardioversion N/A 09/21/2015    Procedure: TRANSESOPHAGEAL ECHOCARDIOGRAM (TEE);  Surgeon: Sanda Klein, MD;  Location: [facility] ENDOSCOPY;  Service: Cardiovascular;  Laterality: N/A;    FAMILY HISTORY Family History  Problem Relation Age of Onset  . Cancer Mother     Colon  . Cancer Father     Lung, Mouth,leg   the patient's father died at the age of 18 from metastatic head and neck carcinoma. The patient's mother died of breast cancer the age of 61. The patient had 2 brothers, both with prostate cancer. She has 4 sisters, one diagnosed with breast cancer at the age of 35, another 1 recent underwent bilateral mastectomies. Another one died from colon cancer.   GYNECOLOGIC HISTORY:   menarche age 67, she is GX P0. She underwent menopause at age 47, status post HRT for 12 years. She status post right salpingo-oophorectomy.   SOCIAL HISTORY:  She works at [employer] in [place]. Her  husband died at [facility] [place] from complications of surgery after a 7-1/2 month hospitalization) 2008). She lives by herself, with no pets. She tells me she works 9 hours a day 5 days a week.     ADVANCED DIRECTIVES: In place   HEALTH MAINTENANCE: Social History  Substance Use Topics  . Smoking status: Former Smoker    Quit date: 11/22/1982  . Smokeless tobacco: Never Used     Comment: Quit smoking x 32 years  . Alcohol Use: No     Colonoscopy:  PAP:  Bone density:  Lipid panel:  Allergies  Allergen Reactions  . Aspirin Nausea And Vomiting  . Boost Pudding [Nutritional Supplements] Diarrhea  . Percodan [Oxycodone-Aspirin] Nausea Only  .  Codeine Nausea Only    Current Outpatient Prescriptions  Medication Sig Dispense Refill  . calcium carbonate (OS-CAL) 600 MG TABS tablet Take 600 mg by mouth 2 (two) times daily with a meal.    . dexamethasone (DECADRON) 2 MG tablet Take 1 tablet (2 mg total) by mouth every 12 (twelve) hours.    . fentaNYL (DURAGESIC - DOSED MCG/HR) 50 MCG/HR Place 1 patch (50 mcg total) onto the skin every 3 (three) days. 10 patch 0  . Furosemide (LASIX PO) Take by mouth.    . lidocaine (LIDODERM) 5 % Place 1 patch onto the skin daily. Remove & Discard patch within 12 hours or as directed by MD 30 patch 0  . lidocaine-prilocaine (EMLA) cream Apply to affected area once 30 g 3  . magic mouthwash w/lidocaine SOLN Take 10 mLs by mouth 3 (three) times daily as needed for mouth pain.  0  . magnesium oxide (MAG-OX) 400 (241.3 Mg) MG tablet Take 1 tablet (400 mg total) by mouth daily. 30 tablet 0  . morphine (MSIR) 15 MG tablet Take 1 tablet (15 mg total) by mouth every 4 (four) hours as needed for severe pain. Once every 12 hours as needed 12 tablet 0  . ondansetron (ZOFRAN-ODT) 8 MG disintegrating tablet DISSOLVE 1 TABLET ON THE TONGUE EVERY 8 HOURS AS NEEDED FOR NAUSEA    . pantoprazole (PROTONIX) 40 MG tablet Take 1 tablet (40 mg total) by mouth daily.    Vladimir Faster Glycol-Propyl Glycol (SYSTANE OP) Apply 1 drop to eye daily as needed (dry eyes).    Marland Kitchen POTASSIUM PO Take by mouth.    . valACYclovir (VALTREX) 500 MG tablet Take 1 tablet (500 mg total) by mouth daily. 30 tablet 1  . vancomycin (VANCOCIN) 1-5 GM/200ML-% SOLN Inject 200 mLs (1,000 mg total) into the vein daily. 4000 mL 0  . warfarin (COUMADIN) 4 MG tablet Take 1 tablet (4 mg total) by mouth daily. (Patient taking differently: Take 2-4 mg by mouth daily. Take 2 mg on Mon thru Thurs & Take 4 mg on Fri, Sat, Sun) 90 tablet 4  . warfarin (COUMADIN) 4 MG tablet TAKE 1 TABLET BY MOUTH ON MONDAYS AND FRIDAYS, AND TAKE 1/2 TABLET BY MOUTH ON ALL OTHER DAYS 90  tablet 0  . gabapentin (NEURONTIN) 100 MG capsule Take 1 capsule (100 mg total) by mouth at bedtime. (Patient not taking: Reported on 10/19/2015) 30 capsule 1  . prochlorperazine (COMPAZINE) 10 MG tablet Take 1 tablet (10 mg total) by mouth every 6 (six) hours as needed (Nausea or vomiting). (Patient not taking: Reported on 10/19/2015) 30 tablet 1   No current facility-administered medications for this visit.    OBJECTIVE: Middle-aged white woman examined in a wheelchair  Filed Vitals:  10/19/15 1410  BP: 85/66  Pulse: 99  Temp: 98.1 F (36.7 C)  Resp: 18     Body mass index is 24.01 kg/(m^2).    ECOG FS:2 - Symptomatic, <50% confined to bed Filed Vitals:   10/19/15 1410  BP: 85/66  Pulse: 99  Temp: 98.1 F (36.7 C)  Resp: 18   Sclerae unicteric, EOMs intact Oropharynx shows no thrush, small ulcer soft palate No cervical or supraclavicular adenopathy Lungs no rales or rhonchi Heart regular rate and rhythm Abd soft, nontender, positive bowel sounds MSK no focal spinal tenderness, grade 2-3 bilateral lower extremity edema Neuro: nonfocal, well oriented, appropriate affect Breasts: deferred Skin: port accessed, no erythema or swelling   LAB RESULTS: CBC    Component Value Date/Time   WBC 0.5* 10/19/2015 1348   WBC 13.2* 10/02/2015 1356   RBC 3.19* 10/19/2015 1348   RBC 2.89* 10/02/2015 1356   HGB 9.9* 10/19/2015 1348   HGB 9.2* 10/02/2015 1356   HCT 29.3* 10/19/2015 1348   HCT 28.1* 10/02/2015 1356   PLT 47* 10/19/2015 1348   PLT 137* 10/02/2015 1356   MCV 91.8 10/19/2015 1348   MCV 97.2 10/02/2015 1356   MCH 31.0 10/19/2015 1348   MCH 31.8 10/02/2015 1356   MCHC 33.8 10/19/2015 1348   MCHC 32.7 10/02/2015 1356   RDW 16.4* 10/19/2015 1348   RDW 17.9* 10/02/2015 1356   LYMPHSABS 0.0* 10/19/2015 1348   LYMPHSABS 0.5* 10/02/2015 1356   MONOABS 0.0* 10/19/2015 1348   MONOABS 0.5 10/02/2015 1356   EOSABS 0.0 10/19/2015 1348   EOSABS 0.0 10/02/2015 1356    BASOSABS 0.0 10/19/2015 1348   BASOSABS 0.0 10/02/2015 1356      Recent Labs Lab 10/19/15 1426  INR 3.89*    Urinalysis    Component Value Date/Time   COLORURINE YELLOW 09/24/2015 1455   APPEARANCEUR CLEAR 09/24/2015 1455   LABSPEC 1.013 09/24/2015 1455   PHURINE 5.0 09/24/2015 1455   GLUCOSEU NEGATIVE 09/24/2015 1455   HGBUR TRACE* 09/24/2015 1455   BILIRUBINUR NEGATIVE 09/24/2015 1455   KETONESUR NEGATIVE 09/24/2015 1455   PROTEINUR NEGATIVE 09/24/2015 1455   UROBILINOGEN 0.2 01/23/2015 1830   NITRITE NEGATIVE 09/24/2015 1455   LEUKOCYTESUR NEGATIVE 09/24/2015 1455    STUDIES: Dg Chest 1 View  09/24/2015  CLINICAL DATA:  Position of PICC. EXAM: CHEST 1 VIEW COMPARISON:  Radiograph of Sep 15, 2015.  CT scan of Sep 20, 2015. FINDINGS: The heart size and mediastinal contours are within normal limits. Interval placement of right-sided PICC line, with distal tip in expected position of cavoatrial junction. No pneumothorax or significant pleural effusion is noted. Right axillary surgical clips are noted. Cavitary lesion is seen in the left upper lobe concerning for pulmonary abscess. Another cavitary lesion is seen in the right upper lobe. The visualized skeletal structures are unremarkable. IMPRESSION: Interval placement of right-sided PICC line, with distal tip in expected position of cavoatrial junction. Bilateral cavitary lesions are noted most consistent with cavitary pneumonia. Electronically Signed   By: Marijo Conception, M.D.   On: 09/24/2015 09:12   Ct Chest Wo Contrast  09/20/2015  CLINICAL DATA:  Pneumonia. Left-sided chest pain. Personal history of breast cancer with bone metastases. EXAM: CT CHEST WITHOUT CONTRAST TECHNIQUE: Multidetector CT imaging of the chest was performed following the standard protocol without IV contrast. COMPARISON:  Chest radiography 09/15/2015.  Chest CT 09/11/2015. FINDINGS: Again demonstrated is benign appearing pleural and parenchymal scarring at  both lung apices. There is a  background pattern of centrilobular emphysema particularly evident in the upper lobes. Patient has developed multiple widely disseminated cavitary infiltrates. There are 2 adjacent regions of cavitary infiltration in the left upper lobe responsible for the chest radiographic finding. The larger area measures 3.6 cm in diameter. The smaller area measures 2.8 cm in diameter. There is no pleural or pericardial fluid. No enlarged hilar or mediastinal lymph nodes are identified. Widespread bony metastatic changes are present as seen previously. Scans in the upper abdomen do not show any acute finding. A region of hazy infiltrate in the right lower lobe with 2 areas of cavitary infiltration is newly developed. The larger region measures 3.5 cm in diameter in the smaller region measures 1.4 cm in diameter. IMPRESSION: Development of multiple areas of cavitary infiltration in both the right and left lung, which have developed rapidly since a CT study of 9 days ago at which time day were not present at all. Lesions developing this rapidly must be inflammatory and could represent spontaneous pulmonary abscesses or could represent abscesses secondary to septic emboli. Certainly, atypical organisms must be considered including fungal disease. Electronically Signed   By: Nelson Chimes M.D.   On: 09/20/2015 20:41   Ir Fluoro Guide Cv Line Right  10/02/2015  INDICATION: History of recurrent metastatic breast cancer. In need of durable intravenous access for chemotherapy administration. EXAM: IMPLANTED PORT A CATH PLACEMENT WITH ULTRASOUND AND FLUOROSCOPIC GUIDANCE COMPARISON:  Chest CT - 09/20/2015 MEDICATIONS: Vancomycin 1 gm IV; The antibiotic was administered within an appropriate time interval prior to skin puncture. ANESTHESIA/SEDATION: Moderate (conscious) sedation was employed during this procedure. A total of Versed 2 mg and Fentanyl 75 mcg was administered intravenously. Moderate Sedation  Time: 27 minutes. The patient's level of consciousness and vital signs were monitored continuously by radiology nursing throughout the procedure under my direct supervision. CONTRAST:  None FLUOROSCOPY TIME:  30 seconds (3 mGy) COMPLICATIONS: None immediate. PROCEDURE: The procedure, risks, benefits, and alternatives were explained to the patient. Questions regarding the procedure were encouraged and answered. The patient understands and consents to the procedure. The right neck and chest were prepped with chlorhexidine in a sterile fashion, and a sterile drape was applied covering the operative field. Maximum barrier sterile technique with sterile gowns and gloves were used for the procedure. A timeout was performed prior to the initiation of the procedure. Local anesthesia was provided with 1% lidocaine with epinephrine. After creating a small venotomy incision, a micropuncture kit was utilized to access the internal jugular vein under direct, real-time ultrasound guidance. Ultrasound image documentation was performed. The microwire was kinked to measure appropriate catheter length. A subcutaneous port pocket was then created along the upper chest wall utilizing a combination of sharp and blunt dissection. The pocket was irrigated with sterile saline. A single lumen thin power injectable port was chosen for placement. The 8 Fr catheter was tunneled from the port pocket site to the venotomy incision. The port was placed in the pocket. The external catheter was trimmed to appropriate length. At the venotomy, an 8 Fr peel-away sheath was placed over a guidewire under fluoroscopic guidance. The catheter was then placed through the sheath and the sheath was removed. Final catheter positioning was confirmed and documented with a fluoroscopic spot radiograph. The port was accessed with a Huber needle, aspirated and flushed. The venotomy site was closed with an interrupted 4-0 Vicryl suture. The port pocket incision was  closed with interrupted 2-0 Vicryl suture and the skin was opposed with  a running subcuticular 4-0 Vicryl suture. Dermabond and Steri-strips were applied to both incisions. The Port a Catheter was left accessed as requested by the provided clinical team. Dressings were placed. The patient tolerated the procedure well without immediate post procedural complication. FINDINGS: After catheter placement, the tip lies within the superior cavoatrial junction. The catheter aspirates and flushes normally and is ready for immediate use. IMPRESSION: Successful placement of a right internal jugular approach power injectable Port-A-Cath. The catheter is ready for immediate use. Electronically Signed   By: Sandi Mariscal M.D.   On: 10/02/2015 09:55   Ir US Guide Vasc Access Right  10/02/2015  INDICATION: History of recurrent metastatic breast cancer. In need of durable intravenous access for chemotherapy administration. EXAM: IMPLANTED PORT A CATH PLACEMENT WITH ULTRASOUND AND FLUOROSCOPIC GUIDANCE COMPARISON:  Chest CT - 09/20/2015 MEDICATIONS: Vancomycin 1 gm IV; The antibiotic was administered within an appropriate time interval prior to skin puncture. ANESTHESIA/SEDATION: Moderate (conscious) sedation was employed during this procedure. A total of Versed 2 mg and Fentanyl 75 mcg was administered intravenously. Moderate Sedation Time: 27 minutes. The patient's level of consciousness and vital signs were monitored continuously by radiology nursing throughout the procedure under my direct supervision. CONTRAST:  None FLUOROSCOPY TIME:  30 seconds (3 mGy) COMPLICATIONS: None immediate. PROCEDURE: The procedure, risks, benefits, and alternatives were explained to the patient. Questions regarding the procedure were encouraged and answered. The patient understands and consents to the procedure. The right neck and chest were prepped with chlorhexidine in a sterile fashion, and a sterile drape was applied covering the operative field.  Maximum barrier sterile technique with sterile gowns and gloves were used for the procedure. A timeout was performed prior to the initiation of the procedure. Local anesthesia was provided with 1% lidocaine with epinephrine. After creating a small venotomy incision, a micropuncture kit was utilized to access the internal jugular vein under direct, real-time ultrasound guidance. Ultrasound image documentation was performed. The microwire was kinked to measure appropriate catheter length. A subcutaneous port pocket was then created along the upper chest wall utilizing a combination of sharp and blunt dissection. The pocket was irrigated with sterile saline. A single lumen thin power injectable port was chosen for placement. The 8 Fr catheter was tunneled from the port pocket site to the venotomy incision. The port was placed in the pocket. The external catheter was trimmed to appropriate length. At the venotomy, an 8 Fr peel-away sheath was placed over a guidewire under fluoroscopic guidance. The catheter was then placed through the sheath and the sheath was removed. Final catheter positioning was confirmed and documented with a fluoroscopic spot radiograph. The port was accessed with a Huber needle, aspirated and flushed. The venotomy site was closed with an interrupted 4-0 Vicryl suture. The port pocket incision was closed with interrupted 2-0 Vicryl suture and the skin was opposed with a running subcuticular 4-0 Vicryl suture. Dermabond and Steri-strips were applied to both incisions. The Port a Catheter was left accessed as requested by the provided clinical team. Dressings were placed. The patient tolerated the procedure well without immediate post procedural complication. FINDINGS: After catheter placement, the tip lies within the superior cavoatrial junction. The catheter aspirates and flushes normally and is ready for immediate use. IMPRESSION: Successful placement of a right internal jugular approach power  injectable Port-A-Cath. The catheter is ready for immediate use. Electronically Signed   By: Sandi Mariscal M.D.   On: 10/02/2015 09:55    ASSESSMENT: 70 y.o. [place], [place]  woman status post right lumpectomy and axillary lymph node dissection December 2006 for a [place] N2, stage IIIA invasive ductal carcinoma, estrogen and progesterone receptor positive, with subsequent stage IV disease  (1) received adjuvant doxorubicin, docetaxel and cyclophosphamide  (2) status post adjuvant radiation  (3) on tamoxifen to September 2011 when she was noted to have metastatic disease to bone  (4) fulvestrant 02/06/2009 to 01/15/2011  (5) left axillary lymph node positive for an invasive ductal carcinoma, estrogen receptor and HER-2 negative November 2011, and a right breast recurrence for an estrogen receptor positive lesion at the same time.  (6) status post bilateral mastectomies 06/28/2010  (7) status post left chest and axillary radiation  (8) status post right chest wall recurrence October of 2012, treated with electron beam radiation completed December 2012  (9) progression in bone August 2013,  (10) status post radiation to the lumbosacral spine completed October 2013   (11) capecitabine November 2013 through January 2014  (12) tamoxifen/ everolimus February 2014, discontinued November 2014 with progression  (13) Megace 80 mg twice daily with progression January 2015  (14) Radiation to lumbar spine February 2015  (15) letrozole plus Ibrance started 07/08/2013, discontinued May 2017 with progression  (16). Declines genetic testing (as of 08/17/2013)  (17) zolendronate 05/01/2009, discontinued because of pain; Xgeva started 10/08/2013, discontinued because of cost issues as of November 2015, switched to alendronate March 2016, switched back to [place] but stopped after the May 2016 dose because of side effects, on ibandronate [125 mg/ day 21 days on, 7 days off] temporarily, denosumab resumed  10/11/2015  (18) cyclophosphamide, methotrexate, and fluorouracil started 10/11/2015, repeated every 21 days.  (a) s/p Right port placement 10/02/2015  (19) palliative radiation completed 10/02/2015  OTHER ACTIVE PROBLEMS:  (1) chronic GI complaints, followed by Dr. Sydell Axon  (a) treated for c difficile colitis Sept 2016 (stool Ag positive, toxin negative 01/23/2015), received oral vancomycin started 01/24/2015, stopped 02/07/2015  (2) multiple orthopedic problems, including bilateral chronic foot pain, history of rotator cuff repair  (3) history of extensive superficial phlebitis and remote DVT, on chronic low dose Coumadin.  (4) LUL cavitary pneumonia diagnosed as MRSA while at [facility] April 2016, treated with Zosyn x 21 days  (a) recurrent MRSA sepsis/ pneumonia---on IV vancomycin through 10/24/2015--09/26/2015 blood cultures negative  PLAN: Staphany id moderately well with her first cycle of cyclophosphamide,methotrexate and fluorouracil (CMF). She is currently severely neutropenic,and I am going to add Cipro 500 mg twice a day for 5 days to prophylax gram negatives. 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  Her pain is moderately well controlled on her current medications, but she needs a better bowel prophylaxis regimen. I am adding docusate and MiraLAX although she has been very reluctant to use MiraLAX in the past.  Her INRs have been excessively high and I'm going to stop her warfarin for now. Her platelet count is less than 50,000. Her DVT is remote. We will repeat an INR next week when she returns to see me.I am also stopping her dexamethasone.  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 I am adding magic mouthwash when necessary.  She is supposed to be having her legswrapped to control the lymphedema. Possibly they could use compression stockings instead but I think the wrapping would be much easier. Those orders were  placed by [name].  We are giving her a liter of fluid today as well assome additional  diluted. I am bringing her back on June 28 for repeat lab work and possibly repeat IV fluids.   From a cancer point of view the plan is to proceed through 4 cycles of CMFand then restage. Chauncey Cruel, MD   10/19/2015 3:28 PM

## 2015-10-19 NOTE — Patient Instructions (Signed)

## 2015-10-19 NOTE — Progress Notes (Signed)
Pt port left accessed with bio-patch and sorba-view per [facility]

## 2015-10-19 NOTE — Patient Instructions (Signed)

## 2015-10-23 ENCOUNTER — Ambulatory Visit: Payer: Medicare PPO

## 2015-10-23 ENCOUNTER — Other Ambulatory Visit: Payer: Medicare PPO

## 2015-10-24 ENCOUNTER — Telehealth: Payer: Self-pay | Admitting: Oncology

## 2015-10-24 ENCOUNTER — Other Ambulatory Visit: Payer: Self-pay | Admitting: *Deleted

## 2015-10-24 ENCOUNTER — Telehealth: Payer: Self-pay | Admitting: *Deleted

## 2015-10-24 NOTE — Telephone Encounter (Signed)
Message received from Talbert Forest ( pt's niece and HCPOA ) stating pt expired at 1145 pm 09/28/2015.  MD informed and URGENT POF sent to cancel all appointments.

## 2015-10-24 NOTE — Telephone Encounter (Signed)
All apt cancelled per pof, pt expired

## 2015-10-25 ENCOUNTER — Other Ambulatory Visit: Payer: Medicare PPO

## 2015-10-25 ENCOUNTER — Ambulatory Visit: Payer: Medicare PPO | Admitting: Oncology

## 2015-10-25 ENCOUNTER — Other Ambulatory Visit: Payer: Self-pay | Admitting: *Deleted

## 2015-10-25 ENCOUNTER — Ambulatory Visit: Payer: Medicare PPO

## 2015-10-28 DEATH — deceased

## 2015-10-30 ENCOUNTER — Ambulatory Visit: Payer: Medicare PPO

## 2015-10-30 ENCOUNTER — Ambulatory Visit: Payer: Medicare PPO | Admitting: Oncology

## 2015-10-30 ENCOUNTER — Other Ambulatory Visit: Payer: Medicare PPO

## 2015-11-13 ENCOUNTER — Other Ambulatory Visit: Payer: Medicare PPO

## 2015-11-13 ENCOUNTER — Ambulatory Visit: Payer: Medicare PPO

## 2015-11-16 ENCOUNTER — Ambulatory Visit: Payer: Medicare PPO | Admitting: Internal Medicine

## 2015-11-16 ENCOUNTER — Ambulatory Visit: Payer: Self-pay | Admitting: Radiation Oncology

## 2015-11-16 ENCOUNTER — Encounter: Payer: Self-pay | Admitting: Oncology

## 2015-11-16 NOTE — Progress Notes (Signed)
Advised diplomat- ibrance and  Letrozole discontinued.

## 2015-11-20 ENCOUNTER — Other Ambulatory Visit: Payer: Medicare PPO

## 2015-11-20 ENCOUNTER — Ambulatory Visit: Payer: Medicare PPO

## 2015-11-23 ENCOUNTER — Other Ambulatory Visit: Payer: Self-pay | Admitting: *Deleted

## 2015-11-24 ENCOUNTER — Other Ambulatory Visit: Payer: Medicare PPO

## 2015-11-24 ENCOUNTER — Encounter: Payer: Self-pay | Admitting: *Deleted

## 2015-11-24 ENCOUNTER — Ambulatory Visit: Payer: Medicare PPO | Admitting: Oncology

## 2015-12-04 ENCOUNTER — Other Ambulatory Visit: Payer: Medicare PPO

## 2015-12-04 ENCOUNTER — Ambulatory Visit: Payer: Medicare PPO

## 2015-12-11 ENCOUNTER — Other Ambulatory Visit: Payer: Medicare PPO

## 2015-12-11 ENCOUNTER — Ambulatory Visit: Payer: Medicare PPO

## 2015-12-25 ENCOUNTER — Ambulatory Visit: Payer: Medicare PPO

## 2015-12-25 ENCOUNTER — Other Ambulatory Visit: Payer: Medicare PPO

## 2016-01-02 ENCOUNTER — Ambulatory Visit: Payer: Medicare PPO

## 2016-01-02 ENCOUNTER — Other Ambulatory Visit: Payer: Medicare PPO

## 2016-01-22 ENCOUNTER — Ambulatory Visit: Payer: Medicare PPO

## 2016-01-22 ENCOUNTER — Other Ambulatory Visit: Payer: Medicare PPO

## 2017-03-06 ENCOUNTER — Other Ambulatory Visit: Payer: Self-pay | Admitting: Nurse Practitioner

## 2017-06-13 NOTE — Progress Notes (Signed)
This encounter was created in error - please disregard.

## 2018-01-30 IMAGING — PT NM PET TUM IMG RESTAG (PS) SKULL BASE T - THIGH
1 of 7 series · 1 of 25 positions shown · non-contrast
Comparison: 09/07/2014

CLINICAL DATA: Subsequent treatment strategy for metastatic breast
carcinoma to bone.

EXAM:
NUCLEAR MEDICINE PET SKULL BASE TO THIGH
TECHNIQUE: 6.3 mCi F-18 FDG was injected intravenously. Full-ring PET imaging
was performed from the skull base to thigh after the radiotracer. CT
data was obtained and used for attenuation correction and anatomic
localization.
FASTING BLOOD GLUCOSE:  Value: 103 mg/dl

[Series 4: ct sk_thigh 5.0 b31f · axial · 5.0mm · 0.79mm/px · 1 of 181 slices shown]
[im 181/181  brain]
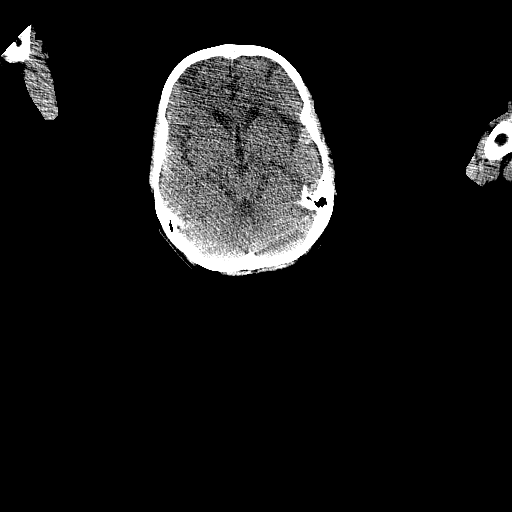

[1 of 25 positions shown; findings below may reference images not displayed]

FINDINGS: NECK

No hypermetabolic lymph nodes in the neck.

CHEST

No hypermetabolic mediastinal or hilar nodes. No suspicious
pulmonary nodules on the CT scan. Biapical pleural- parenchymal
scarring appears stable. Previously seen area of hypermetabolic
airspace disease in the left upper lobe has nearly completely
resolved, consistent with resolving infectious or inflammatory
process.

ABDOMEN/PELVIS

No abnormal hypermetabolic activity within the liver, pancreas,
adrenal glands, or spleen. No hypermetabolic lymph nodes in the
abdomen or pelvis.

Tiny left hepatic lobe cyst remains stable. Prior cholecystectomy
noted. Large duodenal diverticulum again seen. 11 mm left adrenal
nodule remains stable and shows no metabolic activity, consistent
with benign adrenal adenoma. Colonic diverticulosis noted, without
evidence of diverticulitis.

SKELETON

Mixed lytic and sclerotic bone metastases again seen within spine,
pelvis, sternum, and proximal left femur. These appear stable and
show no significant change in low-grade metabolic activity,
consistent with largely treated osseous metastatic disease.
IMPRESSION: Near complete resolution of hypermetabolic airspace disease in left
upper lobe, consistent with resolving infectious or inflammatory
process.

No significant change in appearance of largely treated osseous
metastatic disease.

No new or progressive metastatic disease identified.
